# Patient Record
Sex: Male | Born: 1935
Health system: Southern US, Community
[De-identification: ages and names within clinical notes are randomized; demographics above are authoritative.]

## PROBLEM LIST (undated history)

## (undated) DIAGNOSIS — I10 Essential (primary) hypertension: Secondary | ICD-10-CM

## (undated) DIAGNOSIS — E785 Hyperlipidemia, unspecified: Secondary | ICD-10-CM

## (undated) DIAGNOSIS — R0600 Dyspnea, unspecified: Secondary | ICD-10-CM

## (undated) DIAGNOSIS — C801 Malignant (primary) neoplasm, unspecified: Secondary | ICD-10-CM

## (undated) DIAGNOSIS — M810 Age-related osteoporosis without current pathological fracture: Secondary | ICD-10-CM

## (undated) DIAGNOSIS — I639 Cerebral infarction, unspecified: Secondary | ICD-10-CM

## (undated) DIAGNOSIS — L309 Dermatitis, unspecified: Secondary | ICD-10-CM

## (undated) DIAGNOSIS — I509 Heart failure, unspecified: Secondary | ICD-10-CM

## (undated) DIAGNOSIS — T7840XA Allergy, unspecified, initial encounter: Secondary | ICD-10-CM

## (undated) DIAGNOSIS — J45909 Unspecified asthma, uncomplicated: Secondary | ICD-10-CM

## (undated) HISTORY — DX: Hyperlipidemia, unspecified: E78.5

## (undated) HISTORY — DX: Heart failure, unspecified: I50.9

## (undated) HISTORY — DX: Malignant (primary) neoplasm, unspecified: C80.1

## (undated) HISTORY — PX: MOHS SURGERY: SUR867

## (undated) HISTORY — DX: Age-related osteoporosis without current pathological fracture: M81.0

## (undated) HISTORY — DX: Cerebral infarction, unspecified: I63.9

## (undated) HISTORY — DX: Allergy, unspecified, initial encounter: T78.40XA

## (undated) HISTORY — DX: Essential (primary) hypertension: I10

## (undated) HISTORY — DX: Dermatitis, unspecified: L30.9

---
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
## 2011-10-14 ENCOUNTER — Ambulatory Visit (INDEPENDENT_AMBULATORY_CARE_PROVIDER_SITE_OTHER): Payer: Medicare Other | Admitting: Family Medicine

## 2011-10-14 DIAGNOSIS — E785 Hyperlipidemia, unspecified: Secondary | ICD-10-CM

## 2011-10-14 DIAGNOSIS — Z23 Encounter for immunization: Secondary | ICD-10-CM

## 2011-10-14 DIAGNOSIS — L241 Irritant contact dermatitis due to oils and greases: Secondary | ICD-10-CM

## 2011-10-14 DIAGNOSIS — I10 Essential (primary) hypertension: Secondary | ICD-10-CM

## 2011-12-25 ENCOUNTER — Other Ambulatory Visit: Payer: Self-pay | Admitting: Family Medicine

## 2012-03-11 ENCOUNTER — Other Ambulatory Visit: Payer: Self-pay | Admitting: Family Medicine

## 2012-04-13 ENCOUNTER — Ambulatory Visit: Payer: Medicare Other | Admitting: Family Medicine

## 2012-04-22 ENCOUNTER — Encounter: Payer: Self-pay | Admitting: Family Medicine

## 2012-04-22 ENCOUNTER — Ambulatory Visit: Payer: Medicare Other | Admitting: Family Medicine

## 2012-04-22 VITALS — BP 118/71 | HR 104 | Temp 97.1°F | Resp 18 | Ht 68.0 in | Wt 155.0 lb

## 2012-04-22 DIAGNOSIS — L241 Irritant contact dermatitis due to oils and greases: Secondary | ICD-10-CM

## 2012-04-22 DIAGNOSIS — L309 Dermatitis, unspecified: Secondary | ICD-10-CM

## 2012-04-22 DIAGNOSIS — I10 Essential (primary) hypertension: Secondary | ICD-10-CM | POA: Diagnosis not present

## 2012-04-22 DIAGNOSIS — E785 Hyperlipidemia, unspecified: Secondary | ICD-10-CM

## 2012-04-22 MED ORDER — METHYLPREDNISOLONE ACETATE 40 MG/ML IJ SUSP
80.0000 mg | Freq: Once | INTRAMUSCULAR | Status: AC
  Administered 2012-04-22: 80 mg via INTRAMUSCULAR

## 2012-04-22 MED ORDER — LISINOPRIL 20 MG PO TABS: 20.0000 mg | ORAL_TABLET | Freq: Every day | ORAL | Status: DC

## 2012-04-22 MED ORDER — PRAVASTATIN SODIUM 40 MG PO TABS: 40.0000 mg | ORAL_TABLET | Freq: Every day | ORAL | Status: DC

## 2012-04-22 MED ORDER — PREDNISONE 20 MG PO TABS: ORAL_TABLET | ORAL | Status: DC

## 2012-04-22 MED ORDER — TRIAMCINOLONE ACETONIDE 0.1 % EX CREA: TOPICAL_CREAM | Freq: Two times a day (BID) | CUTANEOUS | Status: DC

## 2012-04-22 NOTE — Patient Instructions (Signed)
Try OTC Flax Seed Oil capsule  1200 mg 1 capsule daily to reduce inflammation and flare-up of Eczema.   Eczema Atopic dermatitis, or eczema, is an inherited type of sensitive skin. Often people with eczema have a family history of allergies, asthma, or hay fever. It causes a red itchy rash and dry scaly skin. The itchiness may occur before the skin rash and may be very intense. It is not contagious. Eczema is generally worse during the cooler winter months and often improves with the warmth of summer. Eczema usually starts showing signs in infancy. Some children outgrow eczema, but it may last through adulthood. Flare-ups may be caused by:  Eating something or contact with something you are sensitive or allergic to.   Stress.  DIAGNOSIS  The diagnosis of eczema is usually based upon symptoms and medical history. TREATMENT  Eczema cannot be cured, but symptoms usually can be controlled with treatment or avoidance of allergens (things to which you are sensitive or allergic to).  Controlling the itching and scratching.   Use over-the-counter antihistamines as directed for itching. It is especially useful at night when the itching tends to be worse.   Use over-the-counter steroid creams as directed for itching.   Scratching makes the rash and itching worse and may cause impetigo (a skin infection) if fingernails are contaminated (dirty).   Keeping the skin well moisturized with creams every day. This will seal in moisture and help prevent dryness. Lotions containing alcohol and water can dry the skin and are not recommended.   Limiting exposure to allergens.   Recognizing situations that cause stress.   Developing a plan to manage stress.  HOME CARE INSTRUCTIONS   Take prescription and over-the-counter medicines as directed by your caregiver.   Do not use anything on the skin without checking with your caregiver.   Keep baths or showers short (5 minutes) in warm (not hot) water. Use  mild cleansers for bathing. You may add non-perfumed bath oil to the bath water. It is best to avoid soap and bubble bath.   Immediately after a bath or shower, when the skin is still damp, apply a moisturizing ointment to the entire body. This ointment should be a petroleum ointment. This will seal in moisture and help prevent dryness. The thicker the ointment the better. These should be unscented.   Keep fingernails cut short and wash hands often. If your child has eczema, it may be necessary to put soft gloves or mittens on your child at night.   Dress in clothes made of cotton or cotton blends. Dress lightly, as heat increases itching.   Avoid foods that may cause flare-ups. Common foods include cow's milk, peanut butter, eggs and wheat.   Keep a child with eczema away from anyone with fever blisters. The virus that causes fever blisters (herpes simplex) can cause a serious skin infection in children with eczema.  SEEK MEDICAL CARE IF:   Itching interferes with sleep.   The rash gets worse or is not better within one week following treatment.   The rash looks infected (pus or soft yellow scabs).   You or your child has an oral temperature above 102 F (38.9 C).   Your baby is older than 3 months with a rectal temperature of 100.5 F (38.1 C) or higher for more than 1 day.   The rash flares up after contact with someone who has fever blisters.  SEEK IMMEDIATE MEDICAL CARE IF:   Your baby is older  than 3 months with a rectal temperature of 102 F (38.9 C) or higher.   Your baby is older than 3 months or younger with a rectal temperature of 100.4 F (38 C) or higher.  Document Released: 10/11/2000 Document Revised: 10/03/2011 Document Reviewed: 08/16/2009 [facility] Patient Information 2012 [place], [place].

## 2012-04-23 ENCOUNTER — Encounter: Payer: Self-pay | Admitting: Family Medicine

## 2012-04-23 DIAGNOSIS — L309 Dermatitis, unspecified: Secondary | ICD-10-CM | POA: Insufficient documentation

## 2012-04-23 DIAGNOSIS — E785 Hyperlipidemia, unspecified: Secondary | ICD-10-CM | POA: Insufficient documentation

## 2012-04-23 DIAGNOSIS — I10 Essential (primary) hypertension: Secondary | ICD-10-CM | POA: Insufficient documentation

## 2012-04-23 NOTE — Progress Notes (Signed)
  Subjective:    Patient ID: [name], male    DOB: 02-05-1932, 78 y.o.   MRN: [id number]  HPI  This 78 y.o Cauc male is in good health, without new complaints today and wants to met this new PCP  and get medication RFs. He has HTN, Lipid disorder and chronic eczema which flares in the summer  and with changes of weather. He uses topical medication but occasionally will uses oral Prednisone  (requests RF to be on file for prn use). He is compliant with prescription meds and denies any adverse effects.    Review of Systems  Constitutional: Negative for fever, activity change, appetite change and fatigue.  HENT:       Mild allergy symptoms with mild wheezing  Respiratory: Negative for cough, chest tightness and shortness of breath.   Cardiovascular: Negative for chest pain, palpitations and leg swelling.  Musculoskeletal: Negative.   Skin: Positive for rash.  Neurological: Negative.        Objective:   Physical Exam  Nursing note and vitals reviewed. Constitutional: He is oriented to person, place, and time. He appears well-developed and well-nourished. No distress.  HENT:  Head: Normocephalic and atraumatic.  Eyes: EOM are normal. No scleral icterus.  Cardiovascular: Normal rate.   Pulmonary/Chest: Effort normal. No respiratory distress.  Musculoskeletal: Normal range of motion. He exhibits no edema.  Neurological: He is alert and oriented to person, place, and time. No cranial nerve deficit.  Skin: Skin is warm and dry. Rash noted.       Eczematoid lesions on extremities  Psychiatric: He has a normal mood and affect. His behavior is normal. Thought content normal.          Assessment & Plan:   1. Eczema - continue topical tx methylPREDNISolone acetate (DEPO-MEDROL) injection 80 mg (per pt request)  RX: Prednisone oral for prn use  2. HTN (hypertension) -stable and controlled Continue current medication

## 2012-08-27 ENCOUNTER — Encounter: Payer: Self-pay | Admitting: Family Medicine

## 2012-08-27 ENCOUNTER — Ambulatory Visit (INDEPENDENT_AMBULATORY_CARE_PROVIDER_SITE_OTHER): Payer: Medicare Other | Admitting: Family Medicine

## 2012-08-27 VITALS — BP 122/74 | HR 90 | Temp 98.3°F | Resp 20 | Ht 69.0 in | Wt 160.0 lb

## 2012-08-27 DIAGNOSIS — L259 Unspecified contact dermatitis, unspecified cause: Secondary | ICD-10-CM

## 2012-08-27 DIAGNOSIS — L239 Allergic contact dermatitis, unspecified cause: Secondary | ICD-10-CM

## 2012-08-27 MED ORDER — METHYLPREDNISOLONE ACETATE 80 MG/ML IJ SUSP
80.0000 mg | Freq: Once | INTRAMUSCULAR | Status: AC
  Administered 2012-08-27: 80 mg via INTRAMUSCULAR

## 2012-08-27 MED ORDER — CETIRIZINE HCL 5 MG PO TABS: 5.0000 mg | ORAL_TABLET | Freq: Every day | ORAL | Status: DC

## 2012-08-27 MED ORDER — PREDNISONE 20 MG PO TABS: ORAL_TABLET | ORAL | Status: DC

## 2012-08-27 MED ORDER — HYDROXYZINE HCL 10 MG PO TABS: 10.0000 mg | ORAL_TABLET | Freq: Three times a day (TID) | ORAL | Status: DC | PRN

## 2012-08-27 NOTE — Progress Notes (Signed)
S:  This 78 y.o. Cauc male has eczema which flares up 2-3 times a year and responds well to Prednisone dose pack. He reports annual trouble in late October. He also tries to avoid cheese and has not had any offending foods lately. He does ride a bicycle and thinks this flare-up is due to tree mold or other environmental agent. This flare started 3 days ago and pt "wanted to try to tough it out" until December appt but intense pruritis was interfering with sleep. Application of topical  Steroid not very helpful.   ROS: Negative for fever/ chills, HA, cough or wheezing, n/v/d, myalgias, arthralgias, dizziness or weakness.  O:  Filed Vitals:   08/27/12 1539  BP: 122/74  Pulse: 90  Temp: 98.3 F (36.8 C)  Resp: 20   GEN: In NAD; WN,WD. HEENT: [place]/AT; EOMI with red-rimmed eyelids and injected conj; nonicteric sclerae. Post pharynx erythematous. COR: RRR. LUNGS: CTA w/o wheezes or rales. SKIN: Diffuse erythema with excoriations on ext. NEURO: A&O x 3; CNs intact. Nonfocal.  A/P: 1. Allergic eczema  methylPREDNISolone acetate (DEPO-MEDROL) injection 80 mg x 1 now RX: Cetirizine 5 mg 1 tab daily RX: Hydroxyzine 10 mg  1 tab 2-3 times daily prn itch (advised about possible sedation) RX: Prednisone dose pack   #24  w/ 1 RF Advised about limiting exposure to allergens

## 2012-08-27 NOTE — Patient Instructions (Signed)
Eczema Atopic dermatitis, or eczema, is an inherited type of sensitive skin. Often people with eczema have a family history of allergies, asthma, or hay fever. It causes a red itchy rash and dry scaly skin. The itchiness may occur before the skin rash and may be very intense. It is not contagious. Eczema is generally worse during the cooler winter months and often improves with the warmth of summer. Eczema usually starts showing signs in infancy. Some children outgrow eczema, but it may last through adulthood. Flare-ups may be caused by:  Eating something or contact with something you are sensitive or allergic to.  Stress. DIAGNOSIS  The diagnosis of eczema is usually based upon symptoms and medical history. TREATMENT  Eczema cannot be cured, but symptoms usually can be controlled with treatment or avoidance of allergens (things to which you are sensitive or allergic to).  Controlling the itching and scratching.  Use over-the-counter antihistamines as directed for itching. It is especially useful at night when the itching tends to be worse.  Use over-the-counter steroid creams as directed for itching.  Scratching makes the rash and itching worse and may cause impetigo (a skin infection) if fingernails are contaminated (dirty).  Keeping the skin well moisturized with creams every day. This will seal in moisture and help prevent dryness. Lotions containing alcohol and water can dry the skin and are not recommended.  Limiting exposure to allergens.  Recognizing situations that cause stress.  Developing a plan to manage stress. HOME CARE INSTRUCTIONS   Take prescription and over-the-counter medicines as directed by your caregiver.  Do not use anything on the skin without checking with your caregiver.  Keep baths or showers short (5 minutes) in warm (not hot) water. Use mild cleansers for bathing. You may add non-perfumed bath oil to the bath water. It is best to avoid soap and bubble  bath.  Immediately after a bath or shower, when the skin is still damp, apply a moisturizing ointment to the entire body. This ointment should be a petroleum ointment. This will seal in moisture and help prevent dryness. The thicker the ointment the better. These should be unscented.  Keep fingernails cut short and wash hands often. If your child has eczema, it may be necessary to put soft gloves or mittens on your child at night.  Dress in clothes made of cotton or cotton blends. Dress lightly, as heat increases itching.  Avoid foods that may cause flare-ups. Common foods include cow's milk, peanut butter, eggs and wheat.  Keep a child with eczema away from anyone with fever blisters. The virus that causes fever blisters (herpes simplex) can cause a serious skin infection in children with eczema. SEEK MEDICAL CARE IF:   Itching interferes with sleep.  The rash gets worse or is not better within one week following treatment.  The rash looks infected (pus or soft yellow scabs).  You or your child has an oral temperature above 102 F (38.9 C).  Your baby is older than 3 months with a rectal temperature of 100.5 F (38.1 C) or higher for more than 1 day.  The rash flares up after contact with someone who has fever blisters. SEEK IMMEDIATE MEDICAL CARE IF:   Your baby is older than 3 months with a rectal temperature of 102 F (38.9 C) or higher.  Your baby is older than 3 months or younger with a rectal temperature of 100.4 F (38 C) or higher. Document Released: 10/11/2000 Document Revised: 01/06/2012 Document Reviewed: 08/16/2009 [employer]   Patient Information 2013 [place], [place].    Anti Itch medication:  Cetirizine 5 mg  Take 1 tablet every day. Short-term itch medication:  Hydroxyzine 10 mg   Take 1 tablet 3 times a day to help reduce your urge to scratch. This medication may cause a little drowsiness. You might want to take this just at bedtime. Prednisone: Take this  medication as you usually do for this eczema flare-up.

## 2012-10-01 ENCOUNTER — Other Ambulatory Visit: Payer: Self-pay | Admitting: Family Medicine

## 2012-10-14 ENCOUNTER — Ambulatory Visit (INDEPENDENT_AMBULATORY_CARE_PROVIDER_SITE_OTHER): Payer: Medicare Other | Admitting: Family Medicine

## 2012-10-14 ENCOUNTER — Encounter: Payer: Self-pay | Admitting: Family Medicine

## 2012-10-14 VITALS — BP 100/64 | HR 114 | Temp 96.8°F | Resp 20 | Ht 68.0 in | Wt 157.0 lb

## 2012-10-14 DIAGNOSIS — I10 Essential (primary) hypertension: Secondary | ICD-10-CM

## 2012-10-14 DIAGNOSIS — L259 Unspecified contact dermatitis, unspecified cause: Secondary | ICD-10-CM

## 2012-10-14 DIAGNOSIS — L309 Dermatitis, unspecified: Secondary | ICD-10-CM

## 2012-10-14 DIAGNOSIS — Z23 Encounter for immunization: Secondary | ICD-10-CM | POA: Diagnosis not present

## 2012-10-14 MED ORDER — METHYLPREDNISOLONE ACETATE 80 MG/ML IJ SUSP
120.0000 mg | Freq: Once | INTRAMUSCULAR | Status: AC
  Administered 2012-10-14: 120 mg via INTRAMUSCULAR

## 2012-10-14 MED ORDER — PREDNISONE 20 MG PO TABS: ORAL_TABLET | ORAL | Status: DC

## 2012-10-14 MED ORDER — MONTELUKAST SODIUM 10 MG PO TABS: 10.0000 mg | ORAL_TABLET | Freq: Every day | ORAL | Status: DC

## 2012-10-14 NOTE — Patient Instructions (Addendum)
I have prescribed a new medication to see if we can calm down the eczema. Singulair (gegenric) 10 mg should be taken at bedtime. I will see you back in 3 months to see if this medication has helped. Try to avoid those foods that cause your eczema to flare up. If you have another outbreak, please let contact the office.

## 2012-10-14 NOTE — Progress Notes (Signed)
S: This 78 y.o. Cauc male returns today because of severe flare of chronic eczema. He is not sure what caused outbreak this time; he tries to avoid cheese but remembers he ate some lasagne a few nights ago. Otherwise he feels really good. He remains afebrile with good appetite. The severe itching is disturbing his sleep. Cetirizine prescribed at last visit is ineffective. Prednisone works "like a dream"; he has a refill but wanted to be seen before he took this medication again. He used to see Dr. Jorja Loa years ago and has been evaluated at [facility] (biopsy confirmed eczema). Pt requests steroid injection today.  ROS: As per HPI. Pt denies unexplained weight loss or fatigue, SOB or cough, DOE, CP or tightness.  O:  Filed Vitals:   10/14/12 0755  BP: 100/64  Pulse: 114  Temp: 96.8 F (36 C)  Resp: 20   GEN: In NAD; WN,WD. HENT: [place]/AT; EOMI w/ chemosis and injected conj. Oropharynx clear, slightly pale w/o exudate or lesions. COR: RRR. No edema. LUNGS: Mild end-exp wheezes. No retractions, rhonchi or rales. SKIN: W&D; diffuse erythematous scaly lesions on face, trunk and extremities. NEURO: A&O x 3; CNs intact. Nonfocal.  A/P:  1. Chronic eczema  methylPREDNISolone acetate (DEPO-MEDROL) injection 120 mg RX: Singulair 10 mg  (generic) 1 tablet at bedtime Cetrizine discontinued.  2. HTN, goal below 140/80 - stable Continue current medication  3. Need for prophylactic vaccination and inoculation against influenza  Flu vaccine greater than or equal to 3yo preservative free IM

## 2013-01-13 ENCOUNTER — Ambulatory Visit: Payer: Medicare Other | Admitting: Family Medicine

## 2013-01-20 ENCOUNTER — Ambulatory Visit (INDEPENDENT_AMBULATORY_CARE_PROVIDER_SITE_OTHER): Payer: Medicare Other | Admitting: Family Medicine

## 2013-01-20 ENCOUNTER — Encounter: Payer: Self-pay | Admitting: Family Medicine

## 2013-01-20 VITALS — BP 156/90 | HR 89 | Temp 97.5°F | Resp 16 | Ht 68.0 in | Wt 157.8 lb

## 2013-01-20 DIAGNOSIS — I10 Essential (primary) hypertension: Secondary | ICD-10-CM | POA: Diagnosis not present

## 2013-01-20 DIAGNOSIS — L259 Unspecified contact dermatitis, unspecified cause: Secondary | ICD-10-CM | POA: Diagnosis not present

## 2013-01-20 DIAGNOSIS — L309 Dermatitis, unspecified: Secondary | ICD-10-CM

## 2013-01-20 DIAGNOSIS — E785 Hyperlipidemia, unspecified: Secondary | ICD-10-CM

## 2013-01-20 LAB — COMPREHENSIVE METABOLIC PANEL
AST: 24 U/L (ref 0–37)
Albumin: 4.4 g/dL (ref 3.5–5.2)
BUN: 18 mg/dL (ref 6–23)
CO2: 26 mEq/L (ref 19–32)
Calcium: 9.9 mg/dL (ref 8.4–10.5)
Chloride: 103 mEq/L (ref 96–112)
Creat: 1.17 mg/dL (ref 0.50–1.35)
Glucose, Bld: 84 mg/dL (ref 70–99)
Potassium: 4.2 mEq/L (ref 3.5–5.3)

## 2013-01-20 LAB — LIPID PANEL
Cholesterol: 177 mg/dL (ref 0–200)
HDL: 60 mg/dL (ref >39)
Triglycerides: 79 mg/dL (ref <150)

## 2013-01-20 MED ORDER — METHYLPREDNISOLONE ACETATE 40 MG/ML IJ SUSP
80.0000 mg | Freq: Once | INTRAMUSCULAR | Status: AC
  Administered 2013-01-20: 80 mg via INTRAMUSCULAR

## 2013-01-20 MED ORDER — MONTELUKAST SODIUM 10 MG PO TABS: ORAL_TABLET | ORAL | Status: DC

## 2013-01-20 MED ORDER — PREDNISONE 20 MG PO TABS: ORAL_TABLET | ORAL | Status: DC

## 2013-01-20 NOTE — Patient Instructions (Signed)
Montelukast (SIngulair) is prescribed to help reduce eczema symptoms; Try taking 1/2 tablet at bedtime with glass of water. This dose reduction should help reduce symptoms of itching without causing side effects.

## 2013-01-20 NOTE — Progress Notes (Signed)
S: This 79 y.o. Cauc man enjoys good health due to daily cycling and good nutrition. He has HTN (controlled on current medication) and Dyslipidemia for which he takes prescription medications (w/o side effects). He has chronic eczematous dermatitis and takes Prednisone as needed with excellent results. He has intense itching and redness but is able to sleep most  Nights. He tried Montelukast but states it caused dizziness; he stopped the medication after 3-4 doses. He attributes this condition to stress (onset during [employer] school); he had little problems with this condition while living in [place]. He is outdoors almost everyday and this is probably an aggravating factor.  Pt states that, given his age, he is not going to be requesting a lot of different tests or other interventions. He feels like he is in good health and feels good but expects his life expectancy is less than 10 years. His parents lived to about age 50.  ROS: As per HPI. Negative for fatigue, fever/ chills, unexplained weight change; diaphoresis, CP or tightness, palpitations, edema, SOB, DOE or cough, wheezes, GI problems, HA, lightheadedness, weakness, numbness or syncope.  O:  Filed Vitals:   01/20/13 1053  BP: 156/90  Pulse: 89  Temp: 97.5 F (36.4 C)  Resp: 16   GEN: In NAD; WN,WD. Scratches almost constantly. HENT: [place]/AT; EOMI injected and tearing noted w/ mild lid margin redness and edema. Oroph clear and moist. SKIN: Diffuse erythema. No other rashes noted. COR: RRR. LUNGS: Normal resp rate and effort. MS: MAEs; no c/c/e. Good muscle tone and no deformities. NEURO: A&O x 3; CNs intact. Nonfocal.  A/P: HTN (hypertension) - stable on current medications.Plan: Comprehensive metabolic panel, CBC with Differential  Eczematous dermatitis - refill oral Prednisone.  Advised pt take Montelukast 10 mg 1/2 tablet hs.  Plan: methylPREDNISolone acetate (DEPO-MEDROL) injection 80 mg x 1 intramuscular.  Dyslipidemia - Plan:  Lipid panel  Meds ordered this encounter  Medications  . methylPREDNISolone acetate (DEPO-MEDROL) injection 80 mg    Sig:   . predniSONE (DELTASONE) 20 MG tablet    Sig: Take and taper tablets as directed.    Dispense:  24 tablet    Refill:  1  . montelukast (SINGULAIR) 10 MG tablet    Sig: Take 1/2 tablet by mouth at bedtime.    Dispense:  30 tablet    Refill:  2   RTC in 4 months for [facility] Annual Subsequent CPE.

## 2013-01-23 NOTE — Progress Notes (Signed)
Quick Note:  Please notify pt that results are normal.   Provide pt with copy of labs. ______ 

## 2013-01-24 ENCOUNTER — Encounter: Payer: Self-pay | Admitting: Radiology

## 2013-05-15 ENCOUNTER — Other Ambulatory Visit: Payer: Self-pay | Admitting: Family Medicine

## 2013-05-27 ENCOUNTER — Ambulatory Visit (INDEPENDENT_AMBULATORY_CARE_PROVIDER_SITE_OTHER): Payer: Medicare Other | Admitting: Family Medicine

## 2013-05-27 ENCOUNTER — Encounter: Payer: Self-pay | Admitting: Family Medicine

## 2013-05-27 VITALS — BP 150/82 | HR 118 | Temp 97.5°F | Resp 18 | Ht 68.0 in | Wt 151.8 lb

## 2013-05-27 DIAGNOSIS — Z139 Encounter for screening, unspecified: Secondary | ICD-10-CM

## 2013-05-27 DIAGNOSIS — H6120 Impacted cerumen, unspecified ear: Secondary | ICD-10-CM

## 2013-05-27 DIAGNOSIS — L259 Unspecified contact dermatitis, unspecified cause: Secondary | ICD-10-CM | POA: Diagnosis not present

## 2013-05-27 DIAGNOSIS — N400 Enlarged prostate without lower urinary tract symptoms: Secondary | ICD-10-CM | POA: Diagnosis not present

## 2013-05-27 DIAGNOSIS — I10 Essential (primary) hypertension: Secondary | ICD-10-CM | POA: Diagnosis not present

## 2013-05-27 DIAGNOSIS — H6121 Impacted cerumen, right ear: Secondary | ICD-10-CM

## 2013-05-27 DIAGNOSIS — I498 Other specified cardiac arrhythmias: Secondary | ICD-10-CM | POA: Diagnosis not present

## 2013-05-27 DIAGNOSIS — Z0000 Encounter for general adult medical examination without abnormal findings: Secondary | ICD-10-CM

## 2013-05-27 DIAGNOSIS — R000 Tachycardia, unspecified: Secondary | ICD-10-CM

## 2013-05-27 LAB — POCT URINALYSIS DIPSTICK
Ketones, UA: NEGATIVE
Protein, UA: 30
Spec Grav, UA: 1.025
pH, UA: 6.5

## 2013-05-27 MED ORDER — PRAVASTATIN SODIUM 40 MG PO TABS: ORAL_TABLET | ORAL | Status: DC

## 2013-05-27 MED ORDER — MONTELUKAST SODIUM 10 MG PO TABS: ORAL_TABLET | ORAL | Status: DC

## 2013-05-27 MED ORDER — LISINOPRIL 20 MG PO TABS: 20.0000 mg | ORAL_TABLET | Freq: Every day | ORAL | Status: DC

## 2013-05-27 MED ORDER — HYDROXYZINE HCL 10 MG PO TABS: 10.0000 mg | ORAL_TABLET | Freq: Three times a day (TID) | ORAL | Status: DC | PRN

## 2013-05-27 MED ORDER — TRIAMCINOLONE ACETONIDE 0.1 % EX CREA: TOPICAL_CREAM | CUTANEOUS | Status: DC

## 2013-05-27 NOTE — Progress Notes (Signed)
Subjective:    Patient ID: [name], male    DOB: 04-16-1932, 79 y.o.   MRN: [id number]  HPI  This 79 y.o. Cauc male has HTN, dyslipidemia and chronic severe eczema. Pt is compliant   with medications w/o mention of adverse effects. Pt stays active by riding his bicycle as primary  means of transportation and lifting weights. Pt is single; he does not smoke or use alcohol.   HCM: CRS- pt declines.            IMM- Current.                PMHx, Soc Hx and Fam Hx reviewed.  Medications reconciled.   Review of Systems  Constitutional: Negative.   HENT: Negative.   Eyes: Negative.        Pt wears "readers" but has not had professional vision evaluation recently.  Respiratory: Negative.   Cardiovascular: Negative.   Gastrointestinal: Negative.   Endocrine: Negative.   Genitourinary: Negative.   Musculoskeletal: Negative.   Skin: Positive for rash.  Allergic/Immunologic: Negative.   Neurological: Negative.   Hematological: Negative.   Psychiatric/Behavioral: Negative.        HANDS Questionnaire for Depression Screening: Score= 0       Objective:   Physical Exam  Nursing note and vitals reviewed. Constitutional: He is oriented to person, place, and time. Vital signs are normal. He appears well-developed and well-nourished. No distress.  HENT:  Head: Normocephalic and atraumatic.  Right Ear: Hearing and external ear normal.  Left Ear: Hearing, tympanic membrane, external ear and ear canal normal.  Nose: Nose normal. No nasal deformity or septal deviation.  Mouth/Throat: Uvula is midline, oropharynx is clear and moist and mucous membranes are normal. No oral lesions. Normal dentition.  R ear canal blocked by cerumen.  Eyes: EOM and lids are normal. Pupils are equal, round, and reactive to light. Right eye exhibits chemosis. Left eye exhibits chemosis. Right conjunctiva is injected. Left conjunctiva is injected. No scleral icterus.  Fundoscopic exam:      The right eye shows no  papilledema. The right eye shows red reflex.       The left eye shows no papilledema. The left eye shows red reflex.  Fundoscopic assessment difficult.  Neck: Normal range of motion. Neck supple. No JVD present. No thyromegaly present.  Cardiovascular: Regular rhythm, S1 normal, S2 normal, normal heart sounds and normal pulses.  Tachycardia present.  PMI is not displaced.  Exam reveals no gallop.   No murmur heard. Pulmonary/Chest: Effort normal and breath sounds normal. No respiratory distress. He has no wheezes. He has no rales.  Abdominal: Normal appearance. He exhibits no distension, no abdominal bruit, no pulsatile midline mass and no mass. There is no hepatosplenomegaly. There is no tenderness. There is no rigidity, no guarding and no CVA tenderness. No hernia. Hernia confirmed negative in the right inguinal area and confirmed negative in the left inguinal area.  Genitourinary: Rectal exam shows external hemorrhoid. Rectal exam shows no fissure, no mass, no tenderness and anal tone normal. Prostate is enlarged. Prostate is not tender.  Prostate enlarged and firm (R lobe > L lobe).  Musculoskeletal: Normal range of motion. He exhibits no edema and no tenderness.  Mild degenerative changed noted in digits (hands) and wrists.  Lymphadenopathy:       Head (right side): No submandibular, no posterior auricular and no occipital adenopathy present.       Head (left side): No submandibular, no posterior auricular  and no occipital adenopathy present.    He has no cervical adenopathy.    He has no axillary adenopathy.       Right: No inguinal and no supraclavicular adenopathy present.       Left: No inguinal and no supraclavicular adenopathy present.  Neurological: He is alert and oriented to person, place, and time. He has normal strength and normal reflexes. He displays no atrophy and no tremor. No cranial nerve deficit or sensory deficit. He exhibits normal muscle tone. Coordination and gait  normal.  Skin: Skin is warm and dry. Abrasion and rash noted. Rash is urticarial. He is not diaphoretic. There is erythema. No cyanosis. Nails show no clubbing.  Psychiatric: He has a normal mood and affect. His speech is normal and behavior is normal. Judgment and thought content normal. His mood appears not anxious. His affect is not inappropriate. Cognition and memory are normal. He does not exhibit a depressed mood.    Results for orders placed in visit on 05/27/13  POCT URINALYSIS DIPSTICK      Result Value Range   Color, UA yellow     Clarity, UA clear     Glucose, UA neg     Bilirubin, UA small     Ketones, UA neg     Spec Grav, UA 1.025     Blood, UA neg     pH, UA 6.5     Protein, UA 30     Urobilinogen, UA 4.0     Nitrite, UA neg     Leukocytes, UA Trace    IFOBT (OCCULT BLOOD)      Result Value Range   IFOBT Negative      ECG: Sinus tachycardia; no ectopy or ST-TW changes noted.     Assessment & Plan:  Routine general medical examination at a health care facility   Sinus tachycardia - Plan: EKG 12-Lead  HTN (hypertension)- Continue current medication.  BPH (benign prostatic hypertrophy)- Pt is asymptomatic and declines further work-up.  Contact dermatitis and other eczema, due to unspecified cause- Continue current medications.  Cerumen impaction, right- Advised to use OTC cerumen drops and return to [facility] in 1 week for irrigation .

## 2013-05-27 NOTE — Patient Instructions (Addendum)
Keeping you healthy  Get these tests  Blood pressure- Have your blood pressure checked once a year by your healthcare provider.  Normal blood pressure is 120/80  Weight- Have your body mass index (BMI) calculated to screen for obesity.  BMI is a measure of body fat based on height and weight. You can also calculate your own BMI at [email or web address].  Cholesterol- Have your cholesterol checked every year.  Diabetes- Have your blood sugar checked regularly if you have high blood pressure, high cholesterol, have a family history of diabetes or if you are overweight.  Screening for Colon Cancer- Colonoscopy starting at age 66.  Screening may begin sooner depending on your family history and other health conditions. Follow up colonoscopy as directed by your Gastroenterologist.  Screening for Prostate Cancer- Both blood work (PSA) and a rectal exam help screen for Prostate Cancer.  Screening begins at age 22 with African-American men and at age 60 with Caucasian men.  Screening may begin sooner depending on your family history.  Take these medicines  Aspirin- One aspirin daily can help prevent Heart disease and Stroke.  Flu shot- Every fall.  Tetanus- Every 10 years. Tdap was given in 2008.  Zostavax- Once after the age of 57 to prevent Shingles. This vaccine was administered in 2013.  Pneumonia shot- Once after the age of 29; if you are younger than 93, ask your healthcare provider if you need a Pneumonia shot. You received this vaccine in 2008.  Take these steps  Don't smoke- If you do smoke, talk to your doctor about quitting.  For tips on how to quit, go to www.smokefree.gov or call 1-800-QUIT-NOW.  Be physically active- Exercise 5 days a week for at least 30 minutes.  If you are not already physically active start slow and gradually work up to 30 minutes of moderate physical activity.  Examples of moderate activity include walking briskly, mowing the yard, dancing, swimming,  bicycling, etc.  Eat a healthy diet- Eat a variety of healthy food such as fruits, vegetables, low fat milk, low fat cheese, yogurt, lean meant, poultry, fish, beans, tofu, etc. For more information go to www.thenutritionsource.org  Drink alcohol in moderation- Limit alcohol intake to less than two drinks a day. Never drink and drive.  Dentist- Brush and floss twice daily; visit your dentist twice a year.  Depression- Your emotional health is as important as your physical health. If you're feeling down, or losing interest in things you would normally enjoy please talk to your healthcare provider.  Eye exam- Visit your eye doctor every year.  Safe sex- If you may be exposed to a sexually transmitted infection, use a condom.  Seat belts- Seat belts can save your life; always wear one.  Smoke/Carbon Monoxide detectors- These detectors need to be installed on the appropriate level of your home.  Replace batteries at least once a year.  Skin cancer- When out in the sun, cover up and use sunscreen 15 SPF or higher.  Violence- If anyone is threatening you, please tell your healthcare provider.  Living Will/ Health care power of attorney- Speak with your healthcare provider and family.

## 2013-06-03 ENCOUNTER — Ambulatory Visit: Payer: Medicare Other | Admitting: Family Medicine

## 2013-06-09 ENCOUNTER — Encounter: Payer: Self-pay | Admitting: Family Medicine

## 2013-06-09 ENCOUNTER — Ambulatory Visit (INDEPENDENT_AMBULATORY_CARE_PROVIDER_SITE_OTHER): Payer: Medicare Other | Admitting: Family Medicine

## 2013-06-09 VITALS — BP 118/74 | HR 98 | Temp 97.9°F | Resp 18 | Ht 68.25 in | Wt 155.4 lb

## 2013-06-09 DIAGNOSIS — H6123 Impacted cerumen, bilateral: Secondary | ICD-10-CM

## 2013-06-09 DIAGNOSIS — H6120 Impacted cerumen, unspecified ear: Secondary | ICD-10-CM

## 2013-06-09 NOTE — Progress Notes (Signed)
S:  This 79 y.o. Cauc male returns today for ear irrigation; he has bilateral cerumen impactions and used ear wax removal drops at home.  O: Filed Vitals:   06/09/13 1016  BP: 118/74  Pulse: 98  Temp: 97.9 F (36.6 C)  Resp: 18   GEN: In NAD; WN,WD/ HENT: /AT. EOMI. Bilateral canals are occluded w/ cerumen. SKIN: W&D; minimal erythema and scaliness today. NEURO: A&O x 3; CNs intact. Nonfocal.  After irrigation by M. Petty, CNA- both canals are clear w/ minimal cerumen.  A/P: Cerumen impaction, bilateral- resolved.

## 2013-11-26 ENCOUNTER — Encounter: Payer: Self-pay | Admitting: Family Medicine

## 2013-11-26 ENCOUNTER — Ambulatory Visit (INDEPENDENT_AMBULATORY_CARE_PROVIDER_SITE_OTHER): Payer: Medicare Other | Admitting: Family Medicine

## 2013-11-26 VITALS — BP 154/96 | HR 100 | Temp 97.5°F | Resp 18 | Ht 68.25 in | Wt 157.0 lb

## 2013-11-26 DIAGNOSIS — I10 Essential (primary) hypertension: Secondary | ICD-10-CM | POA: Diagnosis not present

## 2013-11-26 DIAGNOSIS — L259 Unspecified contact dermatitis, unspecified cause: Secondary | ICD-10-CM | POA: Diagnosis not present

## 2013-11-26 DIAGNOSIS — L500 Allergic urticaria: Secondary | ICD-10-CM

## 2013-11-26 DIAGNOSIS — L309 Dermatitis, unspecified: Secondary | ICD-10-CM

## 2013-11-26 MED ORDER — PREDNISONE 20 MG PO TABS: ORAL_TABLET | ORAL | Status: DC

## 2013-11-26 MED ORDER — METHYLPREDNISOLONE ACETATE 80 MG/ML IJ SUSP
120.0000 mg | Freq: Once | INTRAMUSCULAR | Status: AC
  Administered 2013-11-26: 120 mg via INTRAMUSCULAR

## 2013-11-26 NOTE — Patient Instructions (Signed)
You have a prescription for Singulair (Montelukast) 10 mg 1/2 tablet at bedtime to help your allergies. The usual adult dose is 1 tablet = 10 mg but I want you to start at the lower dose because of your advanced age.  You will be scheduled to return for complete physical in early August.  Contact us sooner if you need anything.
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
## 2014-06-03 ENCOUNTER — Encounter: Payer: Self-pay | Admitting: Family Medicine

## 2014-06-03 ENCOUNTER — Ambulatory Visit (INDEPENDENT_AMBULATORY_CARE_PROVIDER_SITE_OTHER): Payer: Medicare Other | Admitting: Family Medicine

## 2014-06-03 VITALS — BP 110/74 | HR 76 | Temp 98.2°F | Resp 16 | Ht 68.75 in | Wt 152.8 lb

## 2014-06-03 DIAGNOSIS — C44310 Basal cell carcinoma of skin of unspecified parts of face: Secondary | ICD-10-CM | POA: Insufficient documentation

## 2014-06-03 DIAGNOSIS — E785 Hyperlipidemia, unspecified: Secondary | ICD-10-CM | POA: Diagnosis not present

## 2014-06-03 DIAGNOSIS — M810 Age-related osteoporosis without current pathological fracture: Secondary | ICD-10-CM | POA: Diagnosis not present

## 2014-06-03 DIAGNOSIS — L989 Disorder of the skin and subcutaneous tissue, unspecified: Secondary | ICD-10-CM | POA: Diagnosis not present

## 2014-06-03 DIAGNOSIS — H6120 Impacted cerumen, unspecified ear: Secondary | ICD-10-CM

## 2014-06-03 DIAGNOSIS — Z0000 Encounter for general adult medical examination without abnormal findings: Secondary | ICD-10-CM | POA: Diagnosis not present

## 2014-06-03 DIAGNOSIS — Z23 Encounter for immunization: Secondary | ICD-10-CM | POA: Diagnosis not present

## 2014-06-03 DIAGNOSIS — H6122 Impacted cerumen, left ear: Secondary | ICD-10-CM

## 2014-06-03 DIAGNOSIS — L259 Unspecified contact dermatitis, unspecified cause: Secondary | ICD-10-CM

## 2014-06-03 DIAGNOSIS — L309 Dermatitis, unspecified: Secondary | ICD-10-CM

## 2014-06-03 LAB — COMPLETE METABOLIC PANEL WITH GFR
ALBUMIN: 4.4 g/dL (ref 3.5–5.2)
ALK PHOS: 64 U/L (ref 39–117)
ALT: 13 U/L (ref 0–53)
AST: 19 U/L (ref 0–37)
BUN: 12 mg/dL (ref 6–23)
CO2: 26 meq/L (ref 19–32)
Calcium: 9.8 mg/dL (ref 8.4–10.5)
Chloride: 103 mEq/L (ref 96–112)
Creat: 1.22 mg/dL (ref 0.50–1.35)
GFR, EST AFRICAN AMERICAN: 64 mL/min
GFR, Est Non African American: 56 mL/min — ABNORMAL LOW
Glucose, Bld: 92 mg/dL (ref 70–99)
POTASSIUM: 4.3 meq/L (ref 3.5–5.3)
SODIUM: 141 meq/L (ref 135–145)
TOTAL PROTEIN: 7.2 g/dL (ref 6.0–8.3)
Total Bilirubin: 0.9 mg/dL (ref 0.2–1.2)

## 2014-06-03 LAB — POCT URINALYSIS DIPSTICK
Bilirubin, UA: NEGATIVE
Blood, UA: NEGATIVE
Glucose, UA: NEGATIVE
Ketones, UA: NEGATIVE
LEUKOCYTES UA: NEGATIVE
Nitrite, UA: NEGATIVE
PH UA: 6
PROTEIN UA: NEGATIVE
Spec Grav, UA: 1.01
UROBILINOGEN UA: 1

## 2014-06-03 LAB — CBC WITH DIFFERENTIAL/PLATELET
BASOS ABS: 0.1 10*3/uL (ref 0.0–0.1)
BASOS PCT: 1 % (ref 0–1)
EOS PCT: 12 % — AB (ref 0–5)
Eosinophils Absolute: 1 10*3/uL — ABNORMAL HIGH (ref 0.0–0.7)
HEMATOCRIT: 47.4 % (ref 39.0–52.0)
Hemoglobin: 16.7 g/dL (ref 13.0–17.0)
LYMPHS PCT: 18 % (ref 12–46)
Lymphs Abs: 1.4 10*3/uL (ref 0.7–4.0)
MCH: 32.4 pg (ref 26.0–34.0)
MCHC: 35.2 g/dL (ref 30.0–36.0)
MCV: 91.9 fL (ref 78.0–100.0)
MONO ABS: 0.7 10*3/uL (ref 0.1–1.0)
Monocytes Relative: 9 % (ref 3–12)
NEUTROS ABS: 4.8 10*3/uL (ref 1.7–7.7)
Neutrophils Relative %: 60 % (ref 43–77)
PLATELETS: 178 10*3/uL (ref 150–400)
RBC: 5.16 MIL/uL (ref 4.22–5.81)
RDW: 12.8 % (ref 11.5–15.5)
WBC: 8 10*3/uL (ref 4.0–10.5)

## 2014-06-03 LAB — LDL CHOLESTEROL, DIRECT: Direct LDL: 101 mg/dL — ABNORMAL HIGH

## 2014-06-03 LAB — TSH: TSH: 1.349 u[IU]/mL (ref 0.350–4.500)

## 2014-06-03 LAB — VITAMIN D 25 HYDROXY (VIT D DEFICIENCY, FRACTURES): VIT D 25 HYDROXY: 47 ng/mL (ref 30–89)

## 2014-06-03 MED ORDER — MONTELUKAST SODIUM 10 MG PO TABS: ORAL_TABLET | ORAL | Status: DC

## 2014-06-03 MED ORDER — TRIAMCINOLONE ACETONIDE 0.1 % EX CREA: TOPICAL_CREAM | CUTANEOUS | Status: DC

## 2014-06-03 MED ORDER — PREDNISONE 20 MG PO TABS: ORAL_TABLET | ORAL | Status: DC

## 2014-06-03 MED ORDER — PRAVASTATIN SODIUM 40 MG PO TABS: ORAL_TABLET | ORAL | Status: DC

## 2014-06-03 MED ORDER — LISINOPRIL 20 MG PO TABS: 20.0000 mg | ORAL_TABLET | Freq: Every day | ORAL | Status: DC

## 2014-06-03 NOTE — Progress Notes (Signed)
Subjective:    Patient ID: [name], male    DOB: 1932/09/01, 80 y.o.   MRN: [id number]  HPI  This 80 y.o. 59 male is here for [facility] Subsequent annual CPE. His chronic medical conditions remain stable; HTN is well controlled on current medication. Pt is compliant w/ medication for lipid disorder and has no adverse medication effects. He has seasonal allergies and severe eczema. This condition is controlled with topical steroid cream and prn oral Prednisone.  HCM: Colorectal screening- Pt declines.           IMM- Current ; needs Prevnar.           Vision- > 3 years.   Patient Active Problem List   Diagnosis Date Noted  . Osteoporosis/osteopenia increased risk 06/03/2014  . Basal cell carcinoma of face 06/03/2014  . BPH (benign prostatic hypertrophy) 05/27/2013  . Eczema 04/23/2012  . HTN (hypertension) 04/23/2012  . Dyslipidemia 04/23/2012    Prior to Admission medications   Medication Sig Start Date End Date Taking? Authorizing Provider  lisinopril (PRINIVIL,ZESTRIL) 20 MG tablet Take 1 tablet (20 mg total) by mouth daily.   Yes Barton Fanny, MD  pravastatin (PRAVACHOL) 40 MG tablet TAKE 1 TABLET ONCE DAILY.   Yes Barton Fanny, MD  triamcinolone cream (KENALOG) 0.1 % APPLY TWICE DAILY.   Yes Barton Fanny, MD  Flaxseed, Linseed, (FLAX SEED OIL PO) Take by mouth.    Historical Provider, MD  predniSONE (DELTASONE) 20 MG tablet Take and taper tablets as directed.    Barton Fanny, MD    No Known Allergies  Past Surgical History  Procedure Laterality Date  . Tonsillectomy  1940 maybe    Family History  Problem Relation Age of Onset  . Arthritis Mother   . Heart disease Mother 95  . Hypertension Father     History   Social History  . Marital Status: Married    Spouse Name: N/A    Number of Children: N/A  . Years of Education: N/A   Occupational History  . Not on file.   Social History Main Topics  . Smoking status: Never Smoker   .  Smokeless tobacco: Not on file  . Alcohol Use: No     Comment: occas  . Drug Use: No  . Sexual Activity: Not on file   Other Topics Concern  . Not on file   Social History Narrative  . No narrative on file    Review of Systems  Constitutional: Negative.   HENT: Negative.   Eyes: Negative.   Respiratory: Negative.   Cardiovascular: Negative.   Gastrointestinal: Negative.   Endocrine: Negative.   Genitourinary: Negative.   Allergic/Immunologic: Negative.   Neurological: Negative.   Hematological: Negative.   Psychiatric/Behavioral: Negative.       Objective:   Physical Exam  Nursing note and vitals reviewed. Constitutional: He is oriented to person, place, and time. Vital signs are normal. He appears well-developed and well-nourished. No distress.  HENT:  Head: Normocephalic and atraumatic.  Right Ear: Hearing, tympanic membrane, external ear and ear canal normal.  Left Ear: Hearing and external ear normal.  Nose: Nose normal. No nasal deformity or septal deviation.  Mouth/Throat: Uvula is midline, oropharynx is clear and moist and mucous membranes are normal. No oral lesions. Normal dentition. No uvula swelling.  Left EAC blocked by cerumen; cleared w/ alvage and removal of excess cerumen w/ alligator forceps. TM normal.  Eyes: EOM and lids are normal.  Pupils are equal, round, and reactive to light. Right eye exhibits chemosis. Right eye exhibits no discharge. Left eye exhibits chemosis. Left eye exhibits no discharge. Right conjunctiva is injected. Left conjunctiva is injected. No scleral icterus.  Neck: Trachea normal, normal range of motion and full passive range of motion without pain. Neck supple. No JVD present. No spinous process tenderness and no muscular tenderness present. Carotid bruit is not present. No rigidity. No mass and no thyromegaly present.  Cardiovascular: Normal rate, regular rhythm, S1 normal, S2 normal, normal heart sounds, intact distal pulses and  normal pulses.   Occasional extrasystoles are present. PMI is not displaced.  Exam reveals no gallop and no friction rub.   No murmur heard. Pulmonary/Chest: Effort normal and breath sounds normal. No accessory muscle usage. No respiratory distress. He has no decreased breath sounds. He has no rales.  Faint exp wheezes scattered throughout lower lung fields.  Abdominal: Soft. Normal appearance and bowel sounds are normal. He exhibits no distension, no abdominal bruit, no pulsatile midline mass and no mass. There is no hepatosplenomegaly. There is no tenderness. There is no guarding and no CVA tenderness. No hernia.  Genitourinary:  DRE deferred.  Musculoskeletal:       Cervical back: Normal.       Thoracic back: Normal.       Lumbar back: Normal.  Remainder of exam remarkable for mild to moderate degenerative changes in digits (hands) , wrists, knees, ankles and feet.  Lymphadenopathy:       Head (right side): No submental, no submandibular, no tonsillar, no preauricular, no posterior auricular and no occipital adenopathy present.       Head (left side): No submental, no submandibular, no tonsillar, no preauricular, no posterior auricular and no occipital adenopathy present.    He has no cervical adenopathy.       Right: No inguinal and no supraclavicular adenopathy present.       Left: No inguinal and no supraclavicular adenopathy present.  Neurological: He is alert and oriented to person, place, and time. He has normal strength and normal reflexes. He displays no atrophy and no tremor. No cranial nerve deficit or sensory deficit. He exhibits normal muscle tone. He displays a negative Romberg sign. Coordination and gait normal.  Get up and Go Test: normal (< 10 sec).  Skin: Skin is warm, dry and intact. Lesion and rash noted. No ecchymosis and no petechiae noted. He is not diaphoretic. There is erythema. No cyanosis. No pallor. Nails show no clubbing.  Very fair complexion w/ scaly areas; + sun  damage. Face- L upper cheek 5 mm umbilicated lesion; raised edges appear slightly pearly.  Psychiatric: He has a normal mood and affect. His speech is normal and behavior is normal. Judgment and thought content normal. Cognition and memory are normal.    Results for orders placed in visit on 06/03/14  POCT URINALYSIS DIPSTICK      Result Value Ref Range   Color, UA yellow     Clarity, UA clear     Glucose, UA neg     Bilirubin, UA neg     Ketones, UA neg     Spec Grav, UA 1.010     Blood, UA neg     pH, UA 6.0     Protein, UA neg     Urobilinogen, UA 1.0     Nitrite, UA neg     Leukocytes, UA Negative        Assessment & Plan:  Routine general medical examination at a health care facility - Plan: POCT urinalysis dipstick, CBC with Differential, TSH, Vit D  25 hydroxy (rtn osteoporosis monitoring), LDL cholesterol, direct, COMPLETE METABOLIC PANEL WITH GFR  Eczema - Plan: Vit D  25 hydroxy (rtn osteoporosis monitoring)  Facial skin lesion - Advised DERM evaluation but pt declined. Plan: Vit D  25 hydroxy (rtn osteoporosis monitoring)  Dyslipidemia - Plan: POCT urinalysis dipstick, CBC with Differential, TSH, Vit D  25 hydroxy (rtn osteoporosis monitoring), LDL cholesterol, direct, COMPLETE METABOLIC PANEL WITH GFR  Cerumen impaction, left- Successful lavage and removal.  Need for prophylactic vaccination against Streptococcus pneumoniae (pneumococcus) - Plan: Pneumococcal conjugate vaccine 13-valent IM  Osteoporosis, unspecified - Plan: Vit D  25 hydroxy (rtn osteoporosis monitoring)  Meds ordered this encounter  Medications  . lisinopril (PRINIVIL,ZESTRIL) 20 MG tablet    Sig: Take 1 tablet (20 mg total) by mouth daily.    Dispense:  90 tablet    Refill:  3  . DISCONTD: montelukast (SINGULAIR) 10 MG tablet    Sig: Take 1/2 tablet by mouth at bedtime.    Dispense:  30 tablet    Refill:  5  . pravastatin (PRAVACHOL) 40 MG tablet    Sig: TAKE 1 TABLET ONCE DAILY.     Dispense:  90 tablet    Refill:  3  . triamcinolone cream (KENALOG) 0.1 %    Sig: APPLY TWICE DAILY.    Dispense:  453.6 g    Refill:  5  . predniSONE (DELTASONE) 20 MG tablet    Sig: Take and taper tablets as directed.    Dispense:  24 tablet    Refill:  2

## 2014-06-03 NOTE — Patient Instructions (Signed)
Keeping you healthy  Get these tests  Blood pressure- Have your blood pressure checked once a year by your healthcare provider.  Normal blood pressure is 120/80  Weight- Have your body mass index (BMI) calculated to screen for obesity.  BMI is a measure of body fat based on height and weight. You can also calculate your own BMI at [employer].  Cholesterol- Have your cholesterol checked every year.  Diabetes- Have your blood sugar checked regularly if you have high blood pressure, high cholesterol, have a family history of diabetes or if you are overweight.  Screening for Colon Cancer- Colonoscopy starting at age 26.  Screening may begin sooner depending on your family history and other health conditions. Follow up colonoscopy as directed by your Gastroenterologist.  Screening for Prostate Cancer- Both blood work (PSA) and a rectal exam help screen for Prostate Cancer.  Screening begins at age 39 with African-American men and at age 47 with Caucasian men.  Screening may begin sooner depending on your family history.  Take these medicines  Aspirin- One aspirin daily can help prevent Heart disease and Stroke.  Flu shot- Every fall.  Tetanus- Every 10 years.  Zostavax- Once after the age of 54 to prevent Shingles.  Pneumonia shot- Once after the age of 31; if you are younger than 51, ask your healthcare provider if you need a Pneumonia shot.  Take these steps  Don't smoke- If you do smoke, talk to your doctor about quitting.  For tips on how to quit, go to www.smokefree.gov or call 1-800-QUIT-NOW.  Be physically active- Exercise 5 days a week for at least 30 minutes.  If you are not already physically active start slow and gradually work up to 30 minutes of moderate physical activity.  Examples of moderate activity include walking briskly, mowing the yard, dancing, swimming, bicycling, etc.  Eat a healthy diet- Eat a variety of healthy food such as fruits, vegetables, low  fat milk, low fat cheese, yogurt, lean meant, poultry, fish, beans, tofu, etc. For more information go to www.thenutritionsource.org  Drink alcohol in moderation- Limit alcohol intake to less than two drinks a day. Never drink and drive.  Dentist- Brush and floss twice daily; visit your dentist twice a year.  Depression- Your emotional health is as important as your physical health. If you're feeling down, or losing interest in things you would normally enjoy please talk to your healthcare provider.  Eye exam- Visit your eye doctor every year.  Safe sex- If you may be exposed to a sexually transmitted infection, use a condom.  Seat belts- Seat belts can save your life; always wear one.  Smoke/Carbon Monoxide detectors- These detectors need to be installed on the appropriate level of your home.  Replace batteries at least once a year.  Skin cancer- When out in the sun, cover up and use sunscreen 15 SPF or higher. Watch skin lesion on L upper cheek.  Violence- If anyone is threatening you, please tell your healthcare provider.  Living Will/ Health care power of attorney- Speak with your healthcare provider and family.    Vitamin D Deficiency  Not having enough vitamin D is called a deficiency. Your body needs this vitamin to keep your bones strong and healthy. Having too little of it can make your bones soft or can cause other health problems.  HOME CARE  Take all vitamins, herbs, or nutrition drinks (supplements) as told by your doctor.  Have your blood tested 2 months after taking vitamins, herbs,  or nutrition drinks.  Eat foods that have vitamin D. This includes:  Dairy products, cereals, or juices with added vitamin D. Check the label.  Fatty fish like salmon or trout.  Eggs.  Oysters.  Do not use tanning beds.  Stay at a healthy weight. Lose weight if needed.  Keep all doctor visits as told. GET HELP IF:  You have questions.  You continue to have problems.  You  feel sick to your stomach (nauseous) or throw up (vomit).  You cannot go poop (constipated).  You feel confused.  You have severe belly (abdominal) or back pain. MAKE SURE YOU:  Understand these instructions.  Will watch your condition.  Will get help right away if you are not doing well or get worse. Document Released: 10/03/2011 Document Revised: 02/08/2013 Document Reviewed: 10/03/2011 [facility] Patient Information 2015 [place], [place]. This information is not intended to replace advice given to you by your health care provider. Make sure you discuss any questions you have with your health care provider.        Mediterranean Diet  Why follow it? Research shows.   Those who follow the Mediterranean diet have a reduced risk of heart disease    The diet is associated with a reduced incidence of Parkinson's and Alzheimer's diseases   People following the diet may have longer life expectancies and lower rates of chronic diseases    The Dietary Guidelines for Americans recommends the Mediterranean diet as an eating plan to promote health and prevent disease  What Is the Mediterranean Diet?    Healthy eating plan based on typical foods and recipes of Mediterranean-style cooking   The diet is primarily a plant based diet; these foods should make up a majority of meals   Starches - Plant based foods should make up a majority of meals - They are an important sources of vitamins, minerals, energy, antioxidants, and fiber - Choose whole grains, foods high in fiber and minimally processed items  - Typical grain sources include wheat, oats, barley, corn, brown rice, bulgar, farro, millet, polenta, couscous  - Various types of beans include chickpeas, lentils, fava beans, black beans, white beans   Fruits  Veggies - Large quantities of antioxidant rich fruits & veggies; 6 or more servings  - Vegetables can be eaten raw or lightly drizzled with oil and cooked  - Vegetables common to the  traditional Mediterranean Diet include: artichokes, arugula, beets, broccoli, brussel sprouts, cabbage, carrots, celery, collard greens, cucumbers, eggplant, kale, leeks, lemons, lettuce, mushrooms, okra, onions, peas, peppers, potatoes, pumpkin, radishes, rutabaga, shallots, spinach, sweet potatoes, turnips, zucchini - Fruits common to the Mediterranean Diet include: apples, apricots, avocados, cherries, clementines, dates, figs, grapefruits, grapes, melons, nectarines, oranges, peaches, pears, pomegranates, strawberries, tangerines  Fats - Replace butter and margarine with healthy oils, such as olive oil, canola oil, and tahini  - Limit nuts to no more than a handful a day  - Nuts include walnuts, almonds, pecans, pistachios, pine nuts  - Limit or avoid candied, honey roasted or heavily salted nuts - Olives are central to the [employer] - can be eaten whole or used in a variety of dishes   Meats Protein - Limiting red meat: no more than a few times a month - When eating red meat: choose lean cuts and keep the portion to the size of deck of cards - Eggs: approx. 0 to 4 times a week  - Fish and lean poultry: at least 2 a week  -  Healthy protein sources include, chicken, [place], lean beef, lamb - Increase intake of seafood such as tuna, salmon, trout, mackerel, shrimp, scallops - Avoid or limit high fat processed meats such as sausage and bacon  Dairy - Include moderate amounts of low fat dairy products  - Focus on healthy dairy such as fat free yogurt, skim milk, low or reduced fat cheese - Limit dairy products higher in fat such as whole or 2% milk, cheese, ice cream  Alcohol - Moderate amounts of red wine is ok  - No more than 5 oz daily for women (all ages) and men older than age 56  - No more than 10 oz of wine daily for men younger than 39  Other - Limit sweets and other desserts  - Use herbs and spices instead of salt to flavor foods  - Herbs and spices common to the traditional  Mediterranean Diet include: basil, bay leaves, chives, cloves, cumin, fennel, garlic, lavender, marjoram, mint, oregano, parsley, pepper, rosemary, sage, savory, sumac, tarragon, thyme   It's not just a diet, it's a lifestyle:    The Mediterranean diet includes lifestyle factors typical of those in the region    Foods, drinks and meals are best eaten with others and savored   Daily physical activity is important for overall good health   This could be strenuous exercise like running and aerobics   This could also be more leisurely activities such as walking, housework, yard-work, or taking the stairs   Moderation is the key; a balanced and healthy diet accommodates most foods and drinks   Consider portion sizes and frequency of consumption of certain foods   Meal Ideas & Options:    Breakfast:  o Whole wheat toast or whole wheat English muffins with peanut butter & hard boiled egg o Steel cut oats topped with apples & cinnamon and skim milk  o Fresh fruit: banana, strawberries, melon, berries, peaches  o Smoothies: strawberries, bananas, greek yogurt, peanut butter o Low fat greek yogurt with blueberries and granola  o Egg white omelet with spinach and mushrooms o Breakfast couscous: whole wheat couscous, apricots, skim milk, cranberries    Sandwiches:  o Hummus and grilled vegetables (peppers, zucchini, squash) on whole wheat bread   o Grilled chicken on whole wheat pita with lettuce, tomatoes, cucumbers or tzatziki  o Tuna salad on whole wheat bread: tuna salad made with greek yogurt, olives, red peppers, capers, green onions o Garlic rosemary lamb pita: lamb sauted with garlic, rosemary, salt & pepper; add lettuce, cucumber, greek yogurt to pita - flavor with lemon juice and black pepper    Seafood:  o Mediterranean grilled salmon, seasoned with garlic, basil, parsley, lemon juice and black pepper o Shrimp, lemon, and spinach whole-grain pasta salad made with low fat greek yogurt   o Seared scallops with lemon orzo  o Seared tuna steaks seasoned salt, pepper, coriander topped with tomato mixture of olives, tomatoes, olive oil, minced garlic, parsley, green onions and cappers    Meats:  o Herbed greek chicken salad with kalamata olives, cucumber, feta  o Red bell peppers stuffed with spinach, bulgur, lean ground beef (or lentils) & topped with feta   o Kebabs: skewers of chicken, tomatoes, onions, zucchini, squash  o [place] burgers: made with red onions, mint, dill, lemon juice, feta cheese topped with roasted red peppers   Vegetarian o Cucumber salad: cucumbers, artichoke hearts, celery, red onion, feta cheese, tossed in olive oil & lemon juice  o  Hummus and whole grain pita points with a greek salad (lettuce, tomato, feta, olives, cucumbers, red onion) o Lentil soup with celery, carrots made with vegetable broth, garlic, salt and pepper  o Tabouli salad: parsley, bulgur, mint, scallions, cucumbers, tomato, radishes, lemon juice, olive oil, salt and pepper. o

## 2014-06-04 NOTE — Progress Notes (Signed)
Quick Note:  Please notify pt that results are normal.   Provide pt with copy of labs. ______ 

## 2014-12-07 ENCOUNTER — Encounter: Payer: Self-pay | Admitting: Family Medicine

## 2014-12-07 ENCOUNTER — Ambulatory Visit (INDEPENDENT_AMBULATORY_CARE_PROVIDER_SITE_OTHER): Payer: Medicare Other | Admitting: Family Medicine

## 2014-12-07 VITALS — BP 138/80 | HR 92 | Temp 97.8°F | Resp 16 | Ht 68.0 in | Wt 159.4 lb

## 2014-12-07 DIAGNOSIS — I10 Essential (primary) hypertension: Secondary | ICD-10-CM

## 2014-12-07 DIAGNOSIS — E785 Hyperlipidemia, unspecified: Secondary | ICD-10-CM | POA: Diagnosis not present

## 2014-12-07 DIAGNOSIS — Z23 Encounter for immunization: Secondary | ICD-10-CM | POA: Diagnosis not present

## 2014-12-07 LAB — LIPID PANEL
Cholesterol: 180 mg/dL (ref 0–200)
HDL: 64 mg/dL (ref >39)
LDL Cholesterol: 94 mg/dL (ref 0–99)
TRIGLYCERIDES: 109 mg/dL (ref <150)
Total CHOL/HDL Ratio: 2.8 Ratio
VLDL: 22 mg/dL (ref 0–40)

## 2014-12-07 LAB — BASIC METABOLIC PANEL
BUN: 14 mg/dL (ref 6–23)
CALCIUM: 9.4 mg/dL (ref 8.4–10.5)
CHLORIDE: 104 meq/L (ref 96–112)
CO2: 26 meq/L (ref 19–32)
CREATININE: 1.28 mg/dL (ref 0.50–1.35)
GLUCOSE: 80 mg/dL (ref 70–99)
Potassium: 4.4 mEq/L (ref 3.5–5.3)
Sodium: 141 mEq/L (ref 135–145)

## 2014-12-07 MED ORDER — PREDNISONE 20 MG PO TABS: ORAL_TABLET | ORAL | Status: DC

## 2014-12-07 NOTE — Progress Notes (Signed)
Quick Note:  Please notify pt that results are normal.   Provide pt with copy of labs. ______ 

## 2014-12-07 NOTE — Patient Instructions (Signed)
ECZEMA-  Ask your pharmacist about over-the-counter lotion that is good for eczema.  Your next appointment will be in mid to late August 2016. You can call back in the summer to schedule that appointment.

## 2014-12-07 NOTE — Progress Notes (Signed)
S:  This 81 y.o. Male is here for HTN follow-up and medication refills. Chronic eczema is well controlled and quiescent this time of year. Pt requests Prednisone RX to have "just in case". He feels well, is compliant w/ medications and has no adverse effects. He denies diaphoresis, fatigue, abnormal weight change, upper resp problems, CP or tightness, palpitations, SOB or DOE, cough, HA, dizziness, numbness, weakness or syncope. He has no myalgias or back pain associated w/ statin use.  Patient Active Problem List   Diagnosis Date Noted  . Osteoporosis/osteopenia increased risk 06/03/2014  . Basal cell carcinoma of face 06/03/2014  . BPH (benign prostatic hypertrophy) 05/27/2013  . Eczema 04/23/2012  . HTN (hypertension) 04/23/2012  . Dyslipidemia 04/23/2012    Prior to Admission medications   Medication Sig Start Date End Date Taking? Authorizing Provider  lisinopril (PRINIVIL,ZESTRIL) 20 MG tablet Take 1 tablet (20 mg total) by mouth daily. 06/03/14  Yes Barton Fanny, MD  pravastatin (PRAVACHOL) 40 MG tablet TAKE 1 TABLET ONCE DAILY. 06/03/14  Yes Barton Fanny, MD  predniSONE (DELTASONE) 20 MG tablet Take and taper tablets as directed. 12/07/14  Yes Barton Fanny, MD  triamcinolone cream (KENALOG) 0.1 % APPLY TWICE DAILY. 06/03/14  Yes Barton Fanny, MD  Flaxseed, Linseed, (FLAX SEED OIL PO) Take by mouth.    Historical Provider, MD    Past Surgical History  Procedure Laterality Date  . Tonsillectomy  1940 maybe    History   Social History  . Marital Status: Married    Spouse Name: N/A  . Number of Children: N/A  . Years of Education: N/A   Occupational History  . Not on file.   Social History Main Topics  . Smoking status: Never Smoker   . Smokeless tobacco: Not on file  . Alcohol Use: No     Comment: occas  . Drug Use: No  . Sexual Activity: Not on file   Other Topics Concern  . Not on file   Social History Narrative    ROS: As per  HPI.  O: Filed Vitals:   12/07/14 0912  BP: 138/80  Pulse: 92  Temp: 97.8 F (36.6 C)  Resp: 16   GEN: In NAD: WN,WD. HENT: [place]/AT; EOMI w/ clear conj/sclerae. Inner eyelids with mild edema and erythema. Otherwise unremarkable. NECK: Supple w/o LAN or TMG. COR: RRR. Normal S1 and S2 w/o m/g/r. LUNGS: CTA; no wheezes or rhonchi. SKIN: W&D; intact w/ minimal erythema or flakiness. No jaundice but slightly pale complexion. NEURO: A&O x 3; CNs intact. Nonfocal.  A/P: Essential hypertension - Stable and controlled on current medication. Plan: Basic metabolic panel  Dyslipidemia - Stable on statin w/o side effects. Plan: Lipid panel  Need for prophylactic vaccination and inoculation against influenza - Plan: Flu Vaccine QUAD 36+ mos IM  Meds ordered this encounter  Medications  . predniSONE (DELTASONE) 20 MG tablet    Sig: Take and taper tablets as directed.    Dispense:  24 tablet    Refill:  2

## 2015-04-04 DIAGNOSIS — H2513 Age-related nuclear cataract, bilateral: Secondary | ICD-10-CM | POA: Diagnosis not present

## 2015-04-18 DIAGNOSIS — H18411 Arcus senilis, right eye: Secondary | ICD-10-CM | POA: Diagnosis not present

## 2015-04-18 DIAGNOSIS — H2511 Age-related nuclear cataract, right eye: Secondary | ICD-10-CM | POA: Diagnosis not present

## 2015-04-18 DIAGNOSIS — H18412 Arcus senilis, left eye: Secondary | ICD-10-CM | POA: Diagnosis not present

## 2015-04-18 DIAGNOSIS — H02839 Dermatochalasis of unspecified eye, unspecified eyelid: Secondary | ICD-10-CM | POA: Diagnosis not present

## 2015-05-08 DIAGNOSIS — H2511 Age-related nuclear cataract, right eye: Secondary | ICD-10-CM | POA: Diagnosis not present

## 2015-05-08 DIAGNOSIS — H25811 Combined forms of age-related cataract, right eye: Secondary | ICD-10-CM | POA: Diagnosis not present

## 2015-05-09 DIAGNOSIS — H578 Other specified disorders of eye and adnexa: Secondary | ICD-10-CM | POA: Diagnosis not present

## 2015-05-09 DIAGNOSIS — H18461 Peripheral corneal degeneration, right eye: Secondary | ICD-10-CM | POA: Diagnosis not present

## 2015-05-09 DIAGNOSIS — H2512 Age-related nuclear cataract, left eye: Secondary | ICD-10-CM | POA: Diagnosis not present

## 2015-05-29 DIAGNOSIS — D485 Neoplasm of uncertain behavior of skin: Secondary | ICD-10-CM | POA: Diagnosis not present

## 2015-05-29 DIAGNOSIS — B081 Molluscum contagiosum: Secondary | ICD-10-CM | POA: Diagnosis not present

## 2015-05-30 DIAGNOSIS — B081 Molluscum contagiosum: Secondary | ICD-10-CM | POA: Diagnosis not present

## 2015-06-02 ENCOUNTER — Other Ambulatory Visit: Payer: Self-pay | Admitting: Family Medicine

## 2015-06-21 ENCOUNTER — Encounter: Payer: Medicare Other | Admitting: Family Medicine

## 2015-06-24 ENCOUNTER — Other Ambulatory Visit: Payer: Self-pay | Admitting: Family Medicine

## 2015-06-29 HISTORY — PX: CATARACT EXTRACTION, BILATERAL: SHX1313

## 2015-07-25 DIAGNOSIS — C4442 Squamous cell carcinoma of skin of scalp and neck: Secondary | ICD-10-CM | POA: Diagnosis not present

## 2015-07-25 DIAGNOSIS — D485 Neoplasm of uncertain behavior of skin: Secondary | ICD-10-CM | POA: Diagnosis not present

## 2015-07-25 DIAGNOSIS — C44622 Squamous cell carcinoma of skin of right upper limb, including shoulder: Secondary | ICD-10-CM | POA: Diagnosis not present

## 2015-08-01 DIAGNOSIS — H2513 Age-related nuclear cataract, bilateral: Secondary | ICD-10-CM | POA: Diagnosis not present

## 2015-08-01 DIAGNOSIS — H2512 Age-related nuclear cataract, left eye: Secondary | ICD-10-CM | POA: Diagnosis not present

## 2015-08-02 ENCOUNTER — Other Ambulatory Visit: Payer: Self-pay

## 2015-08-02 MED ORDER — TRIAMCINOLONE ACETONIDE 0.1 % EX CREA: TOPICAL_CREAM | CUTANEOUS | Status: DC

## 2015-08-02 MED ORDER — LISINOPRIL 20 MG PO TABS: 20.0000 mg | ORAL_TABLET | Freq: Every day | ORAL | Status: DC

## 2015-08-02 NOTE — Telephone Encounter (Signed)
Pharm called for RF of lisinopril until appt. Gave VO for 1 mos RF.

## 2015-08-11 ENCOUNTER — Ambulatory Visit (INDEPENDENT_AMBULATORY_CARE_PROVIDER_SITE_OTHER): Payer: Medicare Other | Admitting: Family Medicine

## 2015-08-11 ENCOUNTER — Encounter: Payer: Self-pay | Admitting: Family Medicine

## 2015-08-11 VITALS — BP 163/78 | HR 105 | Temp 98.7°F | Resp 16 | Ht 68.0 in | Wt 152.2 lb

## 2015-08-11 DIAGNOSIS — I10 Essential (primary) hypertension: Secondary | ICD-10-CM

## 2015-08-11 DIAGNOSIS — R000 Tachycardia, unspecified: Secondary | ICD-10-CM | POA: Diagnosis not present

## 2015-08-11 DIAGNOSIS — L309 Dermatitis, unspecified: Secondary | ICD-10-CM

## 2015-08-11 DIAGNOSIS — Z23 Encounter for immunization: Secondary | ICD-10-CM

## 2015-08-11 DIAGNOSIS — E785 Hyperlipidemia, unspecified: Secondary | ICD-10-CM | POA: Diagnosis not present

## 2015-08-11 LAB — CBC WITH DIFFERENTIAL/PLATELET
BASOS ABS: 0 10*3/uL (ref 0.0–0.1)
Basophils Relative: 0 % (ref 0–1)
EOS ABS: 0.1 10*3/uL (ref 0.0–0.7)
EOS PCT: 1 % (ref 0–5)
HCT: 46.1 % (ref 39.0–52.0)
Hemoglobin: 16.2 g/dL (ref 13.0–17.0)
LYMPHS ABS: 1.5 10*3/uL (ref 0.7–4.0)
Lymphocytes Relative: 10 % — ABNORMAL LOW (ref 12–46)
MCH: 32.9 pg (ref 26.0–34.0)
MCHC: 35.1 g/dL (ref 30.0–36.0)
MCV: 93.7 fL (ref 78.0–100.0)
MONO ABS: 0.7 10*3/uL (ref 0.1–1.0)
MPV: 9.9 fL (ref 8.6–12.4)
Monocytes Relative: 5 % (ref 3–12)
Neutro Abs: 12.4 10*3/uL — ABNORMAL HIGH (ref 1.7–7.7)
Neutrophils Relative %: 84 % — ABNORMAL HIGH (ref 43–77)
Platelets: 205 10*3/uL (ref 150–400)
RBC: 4.92 MIL/uL (ref 4.22–5.81)
RDW: 13.4 % (ref 11.5–15.5)
WBC: 14.8 10*3/uL — ABNORMAL HIGH (ref 4.0–10.5)

## 2015-08-11 LAB — LIPID PANEL
CHOLESTEROL: 187 mg/dL (ref 125–200)
HDL: 99 mg/dL (ref >=40)
LDL Cholesterol: 63 mg/dL (ref <130)
TRIGLYCERIDES: 124 mg/dL (ref <150)
Total CHOL/HDL Ratio: 1.9 Ratio (ref <=5.0)
VLDL: 25 mg/dL (ref <30)

## 2015-08-11 LAB — COMPREHENSIVE METABOLIC PANEL
ALBUMIN: 3.9 g/dL (ref 3.6–5.1)
ALK PHOS: 60 U/L (ref 40–115)
ALT: 18 U/L (ref 9–46)
AST: 19 U/L (ref 10–35)
BILIRUBIN TOTAL: 1.1 mg/dL (ref 0.2–1.2)
BUN: 24 mg/dL (ref 7–25)
CALCIUM: 9.4 mg/dL (ref 8.6–10.3)
CO2: 29 mmol/L (ref 20–31)
CREATININE: 1.32 mg/dL — AB (ref 0.70–1.11)
Chloride: 103 mmol/L (ref 98–110)
Glucose, Bld: 84 mg/dL (ref 65–99)
Potassium: 4.1 mmol/L (ref 3.5–5.3)
SODIUM: 141 mmol/L (ref 135–146)
TOTAL PROTEIN: 6.6 g/dL (ref 6.1–8.1)

## 2015-08-11 MED ORDER — PRAVASTATIN SODIUM 40 MG PO TABS: ORAL_TABLET | ORAL | Status: DC

## 2015-08-11 MED ORDER — METOPROLOL SUCCINATE ER 25 MG PO TB24: 25.0000 mg | ORAL_TABLET | Freq: Every day | ORAL | Status: DC

## 2015-08-11 MED ORDER — LISINOPRIL 10 MG PO TABS: 10.0000 mg | ORAL_TABLET | Freq: Every day | ORAL | Status: DC

## 2015-08-11 MED ORDER — PREDNISONE 20 MG PO TABS: ORAL_TABLET | ORAL | Status: DC

## 2015-08-11 NOTE — Progress Notes (Signed)
Subjective:    Patient ID: [name], male    DOB: [birth year], 81 y.o.   MRN: [id number]  08/11/2015  Establish Care; Hypertension; and Medication Refill   HPI This 81 y.o. male presents to establish care and for six month follow-up. Previous patient of Dr. Leward Quan.  1. HTN: Patient reports good compliance with medication, good tolerance to medication, and good symptom control.  Not checking BP at home.  No baby ASA.    2.  Hyperlipidemia: Patient reports good compliance with medication, good tolerance to medication, and good symptom control.    3. Basal cell carcinoma:  S/p resection of growth; has several lesions removed.  Must return soon for further resection on 09-11-15.    4. S/p cataract resection B since last visit.  5. S/p crowns placed since last visit.    6. Eczema: chronic issue; B arms.  Uses Prednisone PRN.    No longer riding bike to appointments; now scared of [place]; rode until 46.     Review of Systems  Constitutional: Negative for fever, chills, diaphoresis, activity change, appetite change and fatigue.  Respiratory: Negative for cough and shortness of breath.   Cardiovascular: Negative for chest pain, palpitations and leg swelling.  Gastrointestinal: Negative for nausea, vomiting, abdominal pain and diarrhea.  Endocrine: Negative for cold intolerance, heat intolerance, polydipsia, polyphagia and polyuria.  Skin: Negative for color change, rash and wound.  Neurological: Negative for dizziness, tremors, seizures, syncope, facial asymmetry, speech difficulty, weakness, light-headedness, numbness and headaches.  Psychiatric/Behavioral: Negative for sleep disturbance and dysphoric mood. The patient is not nervous/anxious.     Past Medical History  Diagnosis Date  . Allergy     seasonal  fall  . Hypertension   . Hyperlipidemia   . Eczema   . Cancer ([place])     Basal cell carcinoma; multiple   Past Surgical History  Procedure Laterality Date    . Tonsillectomy  1940 maybe  . Cataract extraction, bilateral  06/29/2015  . Mohs surgery     No Known Allergies  Social History   Social History  . Marital Status: Married    Spouse Name: N/A  . Number of Children: N/A  . Years of Education: N/A   Occupational History  . Not on file.   Social History Main Topics  . Smoking status: Never Smoker   . Smokeless tobacco: Not on file  . Alcohol Use: No     Comment: occas  . Drug Use: No  . Sexual Activity: Not on file   Other Topics Concern  . Not on file   Social History Narrative   Marital status: divorced; good friend with ex-wife      Children: 2 children; 4 grandchildren; no gg      Lives:  Alone in house; daughter six blocks away      Employment: college professor; retired in 1997; history; [place]:  Never      Alcohol: tequila loves but won't buy it.      Exercise:  As much as can; previous competitive biking.      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  No driving; HAS NEVER DRIVEN; has always ridden bike.      Advanced Directives:  DNR; do not resuscitate.  HCPOA: [name] Cummings   Family History  Problem Relation Age of Onset  . Arthritis Mother   . Heart disease Mother 11  .  Hypertension Father        Objective:    BP 163/78 mmHg  Pulse 105  Temp(Src) 98.7 F (37.1 C) (Oral)  Resp 16  Ht 5\' 8"  (1.727 m)  Wt 152 lb 3.2 oz (69.037 kg)  BMI 23.15 kg/m2 Physical Exam  Constitutional: He is oriented to person, place, and time. He appears well-developed and well-nourished. No distress.  HENT:  Head: Normocephalic and atraumatic.  Right Ear: External ear normal.  Left Ear: External ear normal.  Nose: Nose normal.  Mouth/Throat: Oropharynx is clear and moist.  Eyes: Conjunctivae and EOM are normal. Pupils are equal, round, and reactive to light.  Neck: Normal range of motion. Neck supple. Carotid bruit is not present. No thyromegaly present.   Cardiovascular: Regular rhythm, normal heart sounds and intact distal pulses.  Tachycardia present.  Exam reveals no gallop and no friction rub.   No murmur heard. Pulmonary/Chest: Effort normal and breath sounds normal. He has no wheezes. He has no rales.  Abdominal: Soft. Bowel sounds are normal. He exhibits no distension and no mass. There is no tenderness. There is no rebound and no guarding.  Lymphadenopathy:    He has no cervical adenopathy.  Neurological: He is alert and oriented to person, place, and time. No cranial nerve deficit.  Skin: Skin is warm and dry. No rash noted. He is not diaphoretic.  Psychiatric: He has a normal mood and affect. His behavior is normal.  Nursing note and vitals reviewed.   EKG: sinus tachy at 109.    Assessment & Plan:   1. Essential hypertension   2. Hyperlipidemia   3. Need for prophylactic vaccination and inoculation against influenza   4. Tachycardia     1. HTN: uncontrolled; add Metoprolol ER 25mg  daily. Obtain labs. Continue Lisinopril at current dose. 2.  Tachycardia: Uncontrolled and chronic; add Metoprolol ER 25mg  daily.  Obtain labs and EKG. 3.  Hyperlipidemia: controlled; obtain labs; continue medication. 4.  S/p flu vaccine. 5. Eczema: stable; followed by dermatology regularly; refill of Prednisone provided per patient request.   Orders Placed This Encounter  Procedures  . Flu Vaccine QUAD 36+ mos IM  . CBC with Differential/Platelet  . Comprehensive metabolic panel    Order Specific Question:  Has the patient fasted?    Answer:  Yes  . Lipid panel    Order Specific Question:  Has the patient fasted?    Answer:  Yes  . EKG 12-Lead   Meds ordered this encounter  Medications  . DISCONTD: metoprolol succinate (TOPROL-XL) 25 MG 24 hr tablet    Sig: Take 1 tablet (25 mg total) by mouth daily.    Dispense:  90 tablet    Refill:  3  . predniSONE (DELTASONE) 20 MG tablet    Sig: Take and taper tablets as directed.     Dispense:  24 tablet    Refill:  2  . pravastatin (PRAVACHOL) 40 MG tablet    Sig: TAKE 1 TABLET ONCE DAILY.    Dispense:  90 tablet    Refill:  3  . DISCONTD: lisinopril (PRINIVIL,ZESTRIL) 10 MG tablet    Sig: Take 1 tablet (10 mg total) by mouth daily.    Dispense:  90 tablet    Refill:  3    Return in about 4 weeks (around 09/08/2015) for recheck heart rate and blood pressure.     Crista Nuon Elayne Guerin, M.D. [facility] [place], [place]  [address] [phone] phone [phone] fax

## 2015-09-11 DIAGNOSIS — C4442 Squamous cell carcinoma of skin of scalp and neck: Secondary | ICD-10-CM | POA: Diagnosis not present

## 2015-09-11 DIAGNOSIS — C44622 Squamous cell carcinoma of skin of right upper limb, including shoulder: Secondary | ICD-10-CM | POA: Diagnosis not present

## 2015-09-22 ENCOUNTER — Ambulatory Visit (INDEPENDENT_AMBULATORY_CARE_PROVIDER_SITE_OTHER): Payer: Medicare Other | Admitting: Family Medicine

## 2015-09-22 ENCOUNTER — Encounter: Payer: Self-pay | Admitting: Family Medicine

## 2015-09-22 VITALS — BP 142/91 | HR 102 | Temp 98.0°F | Resp 16 | Ht 68.5 in | Wt 156.6 lb

## 2015-09-22 DIAGNOSIS — N189 Chronic kidney disease, unspecified: Secondary | ICD-10-CM | POA: Insufficient documentation

## 2015-09-22 DIAGNOSIS — Z23 Encounter for immunization: Secondary | ICD-10-CM

## 2015-09-22 DIAGNOSIS — C44310 Basal cell carcinoma of skin of unspecified parts of face: Secondary | ICD-10-CM | POA: Diagnosis not present

## 2015-09-22 DIAGNOSIS — N182 Chronic kidney disease, stage 2 (mild): Secondary | ICD-10-CM | POA: Diagnosis not present

## 2015-09-22 DIAGNOSIS — I493 Ventricular premature depolarization: Secondary | ICD-10-CM

## 2015-09-22 DIAGNOSIS — N289 Disorder of kidney and ureter, unspecified: Secondary | ICD-10-CM | POA: Insufficient documentation

## 2015-09-22 DIAGNOSIS — I10 Essential (primary) hypertension: Secondary | ICD-10-CM

## 2015-09-22 DIAGNOSIS — R000 Tachycardia, unspecified: Secondary | ICD-10-CM

## 2015-09-22 MED ORDER — METOPROLOL SUCCINATE ER 50 MG PO TB24: 50.0000 mg | ORAL_TABLET | Freq: Every day | ORAL | Status: DC

## 2015-09-22 MED ORDER — LISINOPRIL 5 MG PO TABS: 5.0000 mg | ORAL_TABLET | Freq: Every day | ORAL | Status: DC

## 2015-09-22 NOTE — Progress Notes (Signed)
Subjective:    Patient ID: [name], male    DOB: October 31, 1931, 81 y.o.   MRN: [id number]  09/22/2015  Follow-up   HPI This 81 y.o. male presents for one month follow-up:  1. HTN/tachycardia:  Added Metoprolol ER 82m daily.  When went for surgery, blood pressure was very low but heart rate was rapid.  Taking Lisinopril 132mdialy and Metoprolol ER 2580maily.  No dizziness. Feels great.  2.  Skin cancer: s/p resection on R hand and L lateral neck.  +skin cancer.  Follow-up on Monday, 09/25/15 for suture removal and evaluation of wounds.  Healing well; would like wounds reviewed and bandaged. Cleaning twice daily with peroxide.  Keeping clean and covered.  3. Health maintenance: due for Pneumovax booster.   Review of Systems  Constitutional: Negative for fever, chills, diaphoresis, activity change, appetite change and fatigue.  Respiratory: Negative for cough and shortness of breath.   Cardiovascular: Negative for chest pain, palpitations and leg swelling.  Gastrointestinal: Negative for nausea, vomiting, abdominal pain and diarrhea.  Endocrine: Negative for cold intolerance, heat intolerance, polydipsia, polyphagia and polyuria.  Skin: Positive for color change and wound. Negative for rash.  Neurological: Negative for dizziness, tremors, seizures, syncope, facial asymmetry, speech difficulty, weakness, light-headedness, numbness and headaches.  Psychiatric/Behavioral: Negative for sleep disturbance and dysphoric mood. The patient is not nervous/anxious.     Past Medical History  Diagnosis Date  . Allergy     seasonal  fall  . Hypertension   . Hyperlipidemia   . Eczema   . Cancer ([facility]   Basal cell carcinoma; multiple   Past Surgical History  Procedure Laterality Date  . Tonsillectomy  1940 maybe  . Cataract extraction, bilateral  06/29/2015  . Mohs surgery     No Known Allergies  Social History   Social History  . Marital Status: Married    Spouse Name: N/A  .  Number of Children: N/A  . Years of Education: N/A   Occupational History  . Not on file.   Social History Main Topics  . Smoking status: Never Smoker   . Smokeless tobacco: Not on file  . Alcohol Use: No     Comment: occas  . Drug Use: No  . Sexual Activity: Not on file   Other Topics Concern  . Not on file   Social History Narrative   Marital status: divorced; good friend with ex-wife      Children: 2 children; 4 grandchildren; no gg      Lives:  Alone in house; daughter six blocks away      Employment: college professor; retired in 1997; [place]; [place]      Alcohol: tequila loves but won't buy it.      Exercise:  As much as can; previous competitive biking.      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  No driving; HAS NEVER DRIVEN; has always ridden bike.      Advanced Directives:  DNR; do not resuscitate.  HCPOA: [name] Cummings   Family History  Problem Relation Age of Onset  . Arthritis Mother   . Heart disease Mother 81 7 Hypertension Father        Objective:    BP 142/91 mmHg  Pulse 102  Temp(Src) 98 F (36.7 C) (Oral)  Resp 16  Ht 5' 8.5" (1.74 m)  Wt 156 lb 9.6 oz (71.033  kg)  BMI 23.46 kg/m2 Physical Exam  Constitutional: He is oriented to person, place, and time. He appears well-developed and well-nourished. No distress.  HENT:  Head: Normocephalic and atraumatic.  Right Ear: External ear normal.  Left Ear: External ear normal.  Nose: Nose normal.  Mouth/Throat: Oropharynx is clear and moist.  Eyes: Conjunctivae and EOM are normal. Pupils are equal, round, and reactive to light.  Neck: Normal range of motion. Neck supple. Carotid bruit is not present. No thyromegaly present.  Cardiovascular: Normal rate, regular rhythm, normal heart sounds and intact distal pulses.   Extrasystoles are present. Exam reveals no gallop and no friction rub.   No murmur heard. Pulmonary/Chest: Effort  normal and breath sounds normal. He has no wheezes. He has no rales.  Abdominal: Soft. Bowel sounds are normal. He exhibits no distension and no mass. There is no tenderness. There is no rebound and no guarding.  Lymphadenopathy:    He has no cervical adenopathy.  Neurological: He is alert and oriented to person, place, and time. No cranial nerve deficit.  Skin: Skin is warm and dry. No rash noted. He is not diaphoretic.  Well healing wound R hand without surrounding erythema, fluctuance, or tenderness.  L lateral neck with well approximated wound; surrounding erythema yet no induration or tenderness or drainage.  Sutures in place in both wounds.  Diffusely dry skin throughout with scaling.  Psychiatric: He has a normal mood and affect. His behavior is normal.  Nursing note and vitals reviewed.  Results for orders placed or performed in visit on 08/11/15  CBC with Differential/Platelet  Result Value Ref Range   WBC 14.8 (H) 4.0 - 10.5 K/uL   RBC 4.92 4.22 - 5.81 MIL/uL   Hemoglobin 16.2 13.0 - 17.0 g/dL   HCT 46.1 39.0 - 52.0 %   MCV 93.7 78.0 - 100.0 fL   MCH 32.9 26.0 - 34.0 pg   MCHC 35.1 30.0 - 36.0 g/dL   RDW 13.4 11.5 - 15.5 %   Platelets 205 150 - 400 K/uL   MPV 9.9 8.6 - 12.4 fL   Neutrophils Relative % 84 (H) 43 - 77 %   Neutro Abs 12.4 (H) 1.7 - 7.7 K/uL   Lymphocytes Relative 10 (L) 12 - 46 %   Lymphs Abs 1.5 0.7 - 4.0 K/uL   Monocytes Relative 5 3 - 12 %   Monocytes Absolute 0.7 0.1 - 1.0 K/uL   Eosinophils Relative 1 0 - 5 %   Eosinophils Absolute 0.1 0.0 - 0.7 K/uL   Basophils Relative 0 0 - 1 %   Basophils Absolute 0.0 0.0 - 0.1 K/uL   Smear Review Criteria for review not met   Comprehensive metabolic panel  Result Value Ref Range   Sodium 141 135 - 146 mmol/L   Potassium 4.1 3.5 - 5.3 mmol/L   Chloride 103 98 - 110 mmol/L   CO2 29 20 - 31 mmol/L   Glucose, Bld 84 65 - 99 mg/dL   BUN 24 7 - 25 mg/dL   Creat 1.32 (H) 0.70 - 1.11 mg/dL   Total Bilirubin 1.1 0.2  - 1.2 mg/dL   Alkaline Phosphatase 60 40 - 115 U/L   AST 19 10 - 35 U/L   ALT 18 9 - 46 U/L   Total Protein 6.6 6.1 - 8.1 g/dL   Albumin 3.9 3.6 - 5.1 g/dL   Calcium 9.4 8.6 - 10.3 mg/dL  Lipid panel  Result Value Ref Range  Cholesterol 187 125 - 200 mg/dL   Triglycerides 124 <150 mg/dL   HDL 99 >=40 mg/dL   Total CHOL/HDL Ratio 1.9 <=5.0 Ratio   VLDL 25 <30 mg/dL   LDL Cholesterol 63 <130 mg/dL   EKG: NSR: frequent PVCs.    Assessment & Plan:   1. Essential hypertension   2. Tachycardia   3. Chronic renal insufficiency, stage 2 (mild)   4. Basal cell carcinoma of face   5. Need for pneumococcal vaccination    -Improved on Metoprolol ER; will increase Metoprolol ER to 73m daily. -Decrease Lisinopril to 534mdaily; warrants continued ACE-I due to renal insufficiency. -Dressed wound on R hand; no evidence of secondary infection. Follow-up with derm on Monday, 11/28. -s/p Pneumovax booster.    Orders Placed This Encounter  Procedures  . Pneumococcal polysaccharide vaccine 23-valent greater than or equal to 2yo subcutaneous/IM  . EKG 12-Lead   Meds ordered this encounter  Medications  . metoprolol succinate (TOPROL-XL) 50 MG 24 hr tablet    Sig: Take 1 tablet (50 mg total) by mouth daily.    Dispense:  90 tablet    Refill:  3  . lisinopril (PRINIVIL,ZESTRIL) 5 MG tablet    Sig: Take 1 tablet (5 mg total) by mouth daily.    Dispense:  90 tablet    Refill:  3    Return in about 2 months (around 11/22/2015) for recheck heart rate and blood pressure.    Soloman Mckeithan MaElayne GuerinM.D. [facility]  [phone] [phone]

## 2015-11-24 ENCOUNTER — Ambulatory Visit (INDEPENDENT_AMBULATORY_CARE_PROVIDER_SITE_OTHER): Payer: Medicare Other | Admitting: Family Medicine

## 2015-11-24 ENCOUNTER — Encounter: Payer: Self-pay | Admitting: Family Medicine

## 2015-11-24 VITALS — BP 139/75 | HR 60 | Temp 98.1°F | Resp 16 | Ht 68.0 in | Wt 152.4 lb

## 2015-11-24 DIAGNOSIS — I10 Essential (primary) hypertension: Secondary | ICD-10-CM | POA: Diagnosis not present

## 2015-11-24 DIAGNOSIS — R000 Tachycardia, unspecified: Secondary | ICD-10-CM

## 2015-11-24 DIAGNOSIS — I493 Ventricular premature depolarization: Secondary | ICD-10-CM

## 2015-11-24 MED ORDER — PREDNISONE 20 MG PO TABS: ORAL_TABLET | ORAL | Status: DC

## 2015-11-24 NOTE — Progress Notes (Signed)
Subjective:    Patient ID: [name], male    DOB: Jan 13, 1932, 82 y.o.   MRN: [id number]  11/24/2015  Follow-up   HPI This 82 y.o. male presents for two month follow-up:  1.  Falls: has suffered with a fall recently.  Stopped biking.  Walks to [employer] but afraid to cross [employer].    2.  HTN: Patient reports good compliance with medication, good tolerance to medication, and good symptom control.    3.  Tachycardia/PVCs:  Patient reports good compliance with medication, good tolerance to medication, and good symptom control.     4.  Eczema:  Requesting refill of Prednisone; using triamcinolone. Review of Systems  Constitutional: Negative for fever, chills, diaphoresis, activity change, appetite change and fatigue.  Eyes: Negative for visual disturbance.  Respiratory: Negative for cough and shortness of breath.   Cardiovascular: Negative for chest pain, palpitations and leg swelling.  Endocrine: Negative for cold intolerance, heat intolerance, polydipsia, polyphagia and polyuria.  Neurological: Negative for dizziness, tremors, seizures, syncope, facial asymmetry, speech difficulty, weakness, light-headedness, numbness and headaches.    Past Medical History  Diagnosis Date  . Allergy     seasonal  fall  . Hypertension   . Hyperlipidemia   . Eczema   . Cancer ([place])     Basal cell carcinoma; multiple   Past Surgical History  Procedure Laterality Date  . Tonsillectomy  1940 maybe  . Cataract extraction, bilateral  06/29/2015  . Mohs surgery     No Known Allergies Current Outpatient Prescriptions  Medication Sig Dispense Refill  . lisinopril (PRINIVIL,ZESTRIL) 5 MG tablet Take 1 tablet (5 mg total) by mouth daily. 90 tablet 3  . metoprolol succinate (TOPROL-XL) 50 MG 24 hr tablet Take 1 tablet (50 mg total) by mouth daily. 90 tablet 3  . pravastatin (PRAVACHOL) 40 MG tablet TAKE 1 TABLET ONCE DAILY. 90 tablet 3  . triamcinolone cream (KENALOG) 0.1 % APPLY TWICE  DAILY. 453.6 g 3  . predniSONE (DELTASONE) 20 MG tablet Take and taper tablets as directed. (Patient not taking: Reported on 11/24/2015) 24 tablet 2   No current facility-administered medications for this visit.   Social History   Social History  . Marital Status: Married    Spouse Name: N/A  . Number of Children: N/A  . Years of Education: N/A   Occupational History  . Not on file.   Social History Main Topics  . Smoking status: Never Smoker   . Smokeless tobacco: Not on file  . Alcohol Use: No     Comment: occas  . Drug Use: No  . Sexual Activity: Not on file   Other Topics Concern  . 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      Exercise:  As much as can; previous competitive biking.      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  No driving; HAS NEVER DRIVEN; has always ridden bike.      Advanced Directives:  DNR; do not resuscitate.  HCPOA: [name] Cummings   Family History  Problem Relation Age of Onset  . Arthritis Mother   . Heart disease Mother 10  . Hypertension Father        Objective:    BP 139/75 mmHg  Pulse 60  Temp(Src) 98.1 F (36.7 C) (Oral)  Resp 16  Ht _0  (1.727 m)  Wt 152 lb 6.4 oz (69.128 kg)  BMI 23.18 kg/m2  SpO2 98% Physical Exam  Constitutional: He is oriented to person, place, and time. He appears well-developed and well-nourished. No distress.  HENT:  Head: Normocephalic and atraumatic.  Right Ear: External ear normal.  Left Ear: External ear normal.  Nose: Nose normal.  Mouth/Throat: Oropharynx is clear and moist.  Eyes: Conjunctivae and EOM are normal. Pupils are equal, round, and reactive to  light.  Neck: Normal range of motion. Neck supple. Carotid bruit is not present. No thyromegaly present.  Cardiovascular: Normal rate, regular rhythm, normal heart sounds and intact distal pulses.  Exam reveals no gallop and no friction rub.   No murmur heard. Pulmonary/Chest: Effort normal and breath sounds normal. He has no wheezes. He has no rales.  Abdominal: Soft. Bowel sounds are normal. He exhibits no distension and no mass. There is no tenderness. There is no rebound and no guarding.  Lymphadenopathy:    He has no cervical adenopathy.  Neurological: He is alert and oriented to person, place, and time. No cranial nerve deficit.  Skin: Skin is warm and dry. No rash noted. He is not diaphoretic.  Psychiatric: He has a normal mood and affect. His behavior is normal.  Nursing note and vitals reviewed.  Results for orders placed or performed in visit on 08/11/15  CBC with Differential/Platelet  Result Value Ref Range   WBC 14.8 (H) 4.0 - 10.5 K/uL   RBC 4.92 4.22 - 5.81 MIL/uL   Hemoglobin 16.2 13.0 - 17.0 g/dL   HCT 46.1 39.0 - 52.0 %   MCV 93.7 78.0 - 100.0 fL   MCH 32.9 26.0 - 34.0 pg   MCHC 35.1 30.0 - 36.0 g/dL   RDW 13.4 11.5 - 15.5 %   Platelets 205 150 - 400 K/uL   MPV 9.9 8.6 - 12.4 fL   Neutrophils Relative % 84 (H) 43 - 77 %   Neutro Abs 12.4 (H) 1.7 - 7.7 K/uL   Lymphocytes Relative 10 (L) 12 - 46 %   Lymphs Abs 1.5 0.7 - 4.0 K/uL   Monocytes Relative 5 3 - 12 %   Monocytes Absolute 0.7 0.1 - 1.0 K/uL   Eosinophils Relative 1 0 - 5 %   Eosinophils Absolute 0.1 0.0 - 0.7 K/uL   Basophils Relative 0 0 - 1 %   Basophils Absolute 0.0 0.0 - 0.1 K/uL   Smear Review Criteria for review not met   Comprehensive metabolic panel  Result Value Ref Range   Sodium 141 135 - 146 mmol/L   Potassium 4.1 3.5 - 5.3 mmol/L   Chloride 103 98 - 110 mmol/L   CO2 29 20 - 31 mmol/L   Glucose, Bld 84 65 - 99 mg/dL   BUN 24 7 - 25 mg/dL   Creat 1.32 (H) 0.70 - 1.11 mg/dL   Total  Bilirubin 1.1 0.2 - 1.2 mg/dL   Alkaline Phosphatase 60 40 - 115 U/L   AST 19 10 - 35 U/L   ALT 18 9 - 46 U/L   Total Protein 6.6 6.1 - 8.1 g/dL   Albumin 3.9 3.6 - 5.1 g/dL  Calcium 9.4 8.6 - 10.3 mg/dL  Lipid panel  Result Value Ref Range   Cholesterol 187 125 - 200 mg/dL   Triglycerides 124 <150 mg/dL   HDL 99 >=40 mg/dL   Total CHOL/HDL Ratio 1.9 <=5.0 Ratio   VLDL 25 <30 mg/dL   LDL Cholesterol 63 <130 mg/dL       Assessment & Plan:   1. Essential hypertension   2. Tachycardia   3. PVC (premature ventricular contraction)     Orders Placed This Encounter  Procedures  . EKG 12-Lead   No orders of the defined types were placed in this encounter.    Return in about 3 months (around 02/22/2016) for complete physical examiniation.    Kristi Elayne Guerin, M.D. [facility] [address] [place], [place]  [address] [phone] phone [phone] fax

## 2016-03-19 ENCOUNTER — Encounter: Payer: Self-pay | Admitting: Family Medicine

## 2016-03-19 ENCOUNTER — Ambulatory Visit: Payer: Medicare Other

## 2016-03-19 ENCOUNTER — Ambulatory Visit (INDEPENDENT_AMBULATORY_CARE_PROVIDER_SITE_OTHER): Payer: Medicare Other

## 2016-03-19 ENCOUNTER — Ambulatory Visit (INDEPENDENT_AMBULATORY_CARE_PROVIDER_SITE_OTHER): Payer: Medicare Other | Admitting: Family Medicine

## 2016-03-19 VITALS — BP 148/90 | HR 77 | Temp 98.7°F | Resp 20 | Ht 68.0 in | Wt 147.2 lb

## 2016-03-19 DIAGNOSIS — N400 Enlarged prostate without lower urinary tract symptoms: Secondary | ICD-10-CM

## 2016-03-19 DIAGNOSIS — J9801 Acute bronchospasm: Secondary | ICD-10-CM | POA: Diagnosis not present

## 2016-03-19 DIAGNOSIS — C44310 Basal cell carcinoma of skin of unspecified parts of face: Secondary | ICD-10-CM

## 2016-03-19 DIAGNOSIS — N182 Chronic kidney disease, stage 2 (mild): Secondary | ICD-10-CM

## 2016-03-19 DIAGNOSIS — Z87898 Personal history of other specified conditions: Secondary | ICD-10-CM | POA: Diagnosis not present

## 2016-03-19 DIAGNOSIS — E785 Hyperlipidemia, unspecified: Secondary | ICD-10-CM

## 2016-03-19 DIAGNOSIS — Z9189 Other specified personal risk factors, not elsewhere classified: Secondary | ICD-10-CM

## 2016-03-19 DIAGNOSIS — R918 Other nonspecific abnormal finding of lung field: Secondary | ICD-10-CM | POA: Diagnosis not present

## 2016-03-19 DIAGNOSIS — I10 Essential (primary) hypertension: Secondary | ICD-10-CM | POA: Diagnosis not present

## 2016-03-19 DIAGNOSIS — I493 Ventricular premature depolarization: Secondary | ICD-10-CM

## 2016-03-19 DIAGNOSIS — R000 Tachycardia, unspecified: Secondary | ICD-10-CM

## 2016-03-19 LAB — POCT URINALYSIS DIP (MANUAL ENTRY)
BILIRUBIN UA: NEGATIVE
GLUCOSE UA: NEGATIVE
Ketones, POC UA: NEGATIVE
LEUKOCYTES UA: NEGATIVE
NITRITE UA: NEGATIVE
PH UA: 6
RBC UA: NEGATIVE
Spec Grav, UA: 1.02
UROBILINOGEN UA: 1

## 2016-03-19 LAB — CBC WITH DIFFERENTIAL/PLATELET
BASOS ABS: 0 {cells}/uL (ref 0–200)
BASOS PCT: 0 %
EOS PCT: 9 %
Eosinophils Absolute: 900 cells/uL — ABNORMAL HIGH (ref 15–500)
HCT: 47.8 % (ref 38.5–50.0)
HEMOGLOBIN: 16 g/dL (ref 13.2–17.1)
LYMPHS ABS: 1600 {cells}/uL (ref 850–3900)
Lymphocytes Relative: 16 %
MCH: 30.8 pg (ref 27.0–33.0)
MCHC: 33.5 g/dL (ref 32.0–36.0)
MCV: 91.9 fL (ref 80.0–100.0)
MONOS PCT: 7 %
MPV: 10.5 fL (ref 7.5–12.5)
Monocytes Absolute: 700 cells/uL (ref 200–950)
Neutro Abs: 6800 cells/uL (ref 1500–7800)
Neutrophils Relative %: 68 %
PLATELETS: 210 10*3/uL (ref 140–400)
RBC: 5.2 MIL/uL (ref 4.20–5.80)
RDW: 13.7 % (ref 11.0–15.0)
WBC: 10 10*3/uL (ref 3.8–10.8)

## 2016-03-19 LAB — COMPREHENSIVE METABOLIC PANEL
ALBUMIN: 4.1 g/dL (ref 3.6–5.1)
ALT: 11 U/L (ref 9–46)
AST: 18 U/L (ref 10–35)
Alkaline Phosphatase: 86 U/L (ref 40–115)
BILIRUBIN TOTAL: 1.2 mg/dL (ref 0.2–1.2)
BUN: 13 mg/dL (ref 7–25)
CHLORIDE: 102 mmol/L (ref 98–110)
CO2: 28 mmol/L (ref 20–31)
CREATININE: 1.07 mg/dL (ref 0.70–1.11)
Calcium: 9.7 mg/dL (ref 8.6–10.3)
Glucose, Bld: 85 mg/dL (ref 65–99)
Potassium: 4.3 mmol/L (ref 3.5–5.3)
SODIUM: 140 mmol/L (ref 135–146)
TOTAL PROTEIN: 6.9 g/dL (ref 6.1–8.1)

## 2016-03-19 LAB — LIPID PANEL
CHOLESTEROL: 164 mg/dL (ref 125–200)
HDL: 66 mg/dL (ref >=40)
LDL CALC: 84 mg/dL (ref <130)
TRIGLYCERIDES: 72 mg/dL (ref <150)
Total CHOL/HDL Ratio: 2.5 Ratio (ref <=5.0)
VLDL: 14 mg/dL (ref <30)

## 2016-03-19 MED ORDER — LEVALBUTEROL HCL 0.63 MG/3ML IN NEBU
0.6300 mg | INHALATION_SOLUTION | Freq: Once | RESPIRATORY_TRACT | Status: AC
  Administered 2016-03-19: 0.63 mg via RESPIRATORY_TRACT

## 2016-03-19 MED ORDER — PREDNISONE 20 MG PO TABS: ORAL_TABLET | ORAL | Status: DC

## 2016-03-19 NOTE — Patient Instructions (Signed)
     IF you received an x-ray today, you will receive an invoice from [facility]. Please contact [facility] at [phone] with questions or concerns regarding your invoice.   IF you received labwork today, you will receive an invoice from [facility]. Please contact [facility] at [phone] with questions or concerns regarding your invoice.   Our billing staff will not be able to assist you with questions regarding bills from these companies.  You will be contacted with the lab results as soon as they are available. The fastest way to get your results is to activate your My Chart account. Instructions are located on the last page of this paperwork. If you have not heard from us regarding the results in 2 weeks, please contact this office.      

## 2016-03-19 NOTE — Progress Notes (Signed)
Subjective:    Patient ID: [name], male    DOB: Oct 11, 1932, 82 y.o.   MRN: [id number]  03/19/2016  Hypertension   HPI This 82 y.o. male presents for four month follow-up:   1.  HTN: Patient reports good compliance with medication, good tolerance to medication, and good symptom control.  Lisinopril 5mg  daily.  Not hcecking BP at home.  2.  Hyperlipidemia: Patient reports good compliance with medication, good tolerance to medication, and good symptom control.    3.  Tachycardia: Patient reports good compliance with medication, good tolerance to medication, and good symptom control.    4.  Eczema: flare ups lately.  Requesting refill of Prednisone.  Drinking Ulon tea which helps.    Going to beach with family in three months; going to [place] in [place].    5.  Allergic rhinitis/asthma: +chilhood asthma.  Ignores symptoms.  Believes in [place] scientist to a point.     Review of Systems  Constitutional: Negative for fever, chills, diaphoresis, activity change, appetite change and fatigue.  Respiratory: Negative for cough and shortness of breath.   Cardiovascular: Negative for chest pain, palpitations and leg swelling.  Gastrointestinal: Negative for nausea, vomiting, abdominal pain and diarrhea.  Endocrine: Negative for cold intolerance, heat intolerance, polydipsia, polyphagia and polyuria.  Skin: Negative for color change, rash and wound.  Neurological: Negative for dizziness, tremors, seizures, syncope, facial asymmetry, speech difficulty, weakness, light-headedness, numbness and headaches.  Psychiatric/Behavioral: Negative for sleep disturbance and dysphoric mood. The patient is not nervous/anxious.     Past Medical History  Diagnosis Date  . Allergy     seasonal  fall  . Hypertension   . Hyperlipidemia   . Eczema   . Cancer ([place])     Basal cell carcinoma; multiple   Past Surgical History  Procedure Laterality Date  . Tonsillectomy  1940 maybe  . Cataract  extraction, bilateral  06/29/2015  . Mohs surgery     Not on File  Social History   Social History  . Marital Status: Married    Spouse Name: N/A  . Number of Children: N/A  . Years of Education: N/A   Occupational History  . retired    Social History Main Topics  . Smoking status: Never Smoker   . Smokeless tobacco: Not on file  . Alcohol Use: No     Comment: occas  . Drug Use: No  . Sexual Activity: Not Currently   Other Topics Concern  . Not on file   Social History Narrative   Marital status: divorced; good friend with ex-wife      Children: 2 children; 4 grandchildren; no gg      Lives:  Alone in house; daughter six blocks away      Employment: college professor; retired in 1997; history; [place]:  Never      Alcohol: tequila loves but won't buy it.      Exercise:  As much as can; previous competitive biking.      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  No driving; HAS NEVER DRIVEN; has always ridden bike.  Daughter takes patient to grocery store and to appointments.        Advanced Directives:  DNR; do not resuscitate.  HCPOA: [name] Cummings   Family History  Problem Relation Age of Onset  . Arthritis Mother   . Heart disease Mother 36  . Hypertension Father  Objective:    BP 148/90 mmHg  Pulse 77  Temp(Src) 98.7 F (37.1 C) (Oral)  Resp 20  Ht 5\' 8"  (1.727 m)  Wt 147 lb 3.2 oz (66.769 kg)  BMI 22.39 kg/m2  SpO2 96% Physical Exam  Constitutional: He is oriented to person, place, and time. He appears well-developed and well-nourished. No distress.  HENT:  Head: Normocephalic and atraumatic.  Right Ear: External ear normal.  Left Ear: External ear normal.  Nose: Nose normal.  Mouth/Throat: Oropharynx is clear and moist.  Eyes: Conjunctivae and EOM are normal. Pupils are equal, round, and reactive to light.  Neck: Normal range of motion. Neck supple. Carotid bruit is not present. No  thyromegaly present.  Cardiovascular: Normal rate, regular rhythm, normal heart sounds and intact distal pulses.  Exam reveals no gallop and no friction rub.   No murmur heard. Pulmonary/Chest: Effort normal. He has wheezes. He has no rales.  Abdominal: Soft. Bowel sounds are normal. He exhibits no distension and no mass. There is no tenderness. There is no rebound and no guarding.  Lymphadenopathy:    He has no cervical adenopathy.  Neurological: He is alert and oriented to person, place, and time. No cranial nerve deficit.  Skin: Skin is warm and dry. No rash noted. He is not diaphoretic.  Psychiatric: He has a normal mood and affect. His behavior is normal.  Nursing note and vitals reviewed.       Assessment & Plan:   1. Essential hypertension   2. Dyslipidemia   3. BPH (benign prostatic hypertrophy)   4. Osteoporosis/osteopenia increased risk   5. Basal cell carcinoma of face   6. Tachycardia   7. PVC (premature ventricular contraction)   8. Chronic renal insufficiency, stage 2 (mild)   9. Bronchospasm     Orders Placed This Encounter  Procedures  . DG Chest 2 View    Standing Status: Future     Number of Occurrences: 1     Standing Expiration Date: 03/19/2017    Order Specific Question:  Reason for Exam (SYMPTOM  OR DIAGNOSIS REQUIRED)    Answer:  wheezing    Order Specific Question:  Preferred imaging location?    Answer:  External  . CBC with Differential/Platelet  . Comprehensive metabolic panel    Order Specific Question:  Has the patient fasted?    Answer:  Yes  . Lipid panel    Order Specific Question:  Has the patient fasted?    Answer:  Yes  . POCT urinalysis dipstick   Meds ordered this encounter  Medications  . levalbuterol (XOPENEX) nebulizer solution 0.63 mg    Sig:   . predniSONE (DELTASONE) 20 MG tablet    Sig: Take and taper tablets as directed.    Dispense:  20 tablet    Refill:  0    Return in about 6 months (around 09/19/2016) for recheck  blood pressure, high cholesterol.    Kristi Elayne Guerin, M.D. [facility] [address] [place], [place]  [address] [phone] phone [phone] fax

## 2016-04-14 ENCOUNTER — Encounter: Payer: Self-pay | Admitting: Family Medicine

## 2016-06-11 ENCOUNTER — Encounter: Payer: Self-pay | Admitting: Family Medicine

## 2016-06-11 ENCOUNTER — Ambulatory Visit (INDEPENDENT_AMBULATORY_CARE_PROVIDER_SITE_OTHER): Payer: Medicare Other | Admitting: Family Medicine

## 2016-06-11 VITALS — BP 122/70 | HR 70 | Temp 98.0°F | Resp 18 | Ht 68.0 in | Wt 148.0 lb

## 2016-06-11 DIAGNOSIS — Z9189 Other specified personal risk factors, not elsewhere classified: Secondary | ICD-10-CM

## 2016-06-11 DIAGNOSIS — L309 Dermatitis, unspecified: Secondary | ICD-10-CM | POA: Diagnosis not present

## 2016-06-11 DIAGNOSIS — E785 Hyperlipidemia, unspecified: Secondary | ICD-10-CM | POA: Diagnosis not present

## 2016-06-11 DIAGNOSIS — Z87898 Personal history of other specified conditions: Secondary | ICD-10-CM

## 2016-06-11 DIAGNOSIS — C44310 Basal cell carcinoma of skin of unspecified parts of face: Secondary | ICD-10-CM

## 2016-06-11 DIAGNOSIS — I493 Ventricular premature depolarization: Secondary | ICD-10-CM | POA: Diagnosis not present

## 2016-06-11 DIAGNOSIS — I10 Essential (primary) hypertension: Secondary | ICD-10-CM

## 2016-06-11 DIAGNOSIS — N182 Chronic kidney disease, stage 2 (mild): Secondary | ICD-10-CM | POA: Diagnosis not present

## 2016-06-11 DIAGNOSIS — R000 Tachycardia, unspecified: Secondary | ICD-10-CM

## 2016-06-11 DIAGNOSIS — Z0000 Encounter for general adult medical examination without abnormal findings: Secondary | ICD-10-CM

## 2016-06-11 LAB — POCT URINALYSIS DIP (MANUAL ENTRY)
BILIRUBIN UA: NEGATIVE
BILIRUBIN UA: NEGATIVE
Glucose, UA: NEGATIVE
Leukocytes, UA: NEGATIVE
Nitrite, UA: NEGATIVE
PH UA: 5.5
Protein Ur, POC: NEGATIVE
RBC UA: NEGATIVE
Spec Grav, UA: 1.02
Urobilinogen, UA: 0.2

## 2016-06-11 LAB — COMPREHENSIVE METABOLIC PANEL
ALT: 13 U/L (ref 9–46)
AST: 17 U/L (ref 10–35)
Albumin: 4.1 g/dL (ref 3.6–5.1)
Alkaline Phosphatase: 61 U/L (ref 40–115)
BILIRUBIN TOTAL: 1 mg/dL (ref 0.2–1.2)
BUN: 17 mg/dL (ref 7–25)
CALCIUM: 9.7 mg/dL (ref 8.6–10.3)
CO2: 27 mmol/L (ref 20–31)
Chloride: 101 mmol/L (ref 98–110)
Creat: 1.22 mg/dL — ABNORMAL HIGH (ref 0.70–1.11)
GLUCOSE: 110 mg/dL — AB (ref 65–99)
Potassium: 4.1 mmol/L (ref 3.5–5.3)
SODIUM: 138 mmol/L (ref 135–146)
Total Protein: 7.2 g/dL (ref 6.1–8.1)

## 2016-06-11 LAB — CBC WITH DIFFERENTIAL/PLATELET
Basophils Absolute: 0 cells/uL (ref 0–200)
Basophils Relative: 0 %
Eosinophils Absolute: 0 cells/uL — ABNORMAL LOW (ref 15–500)
Eosinophils Relative: 0 %
HEMATOCRIT: 46.9 % (ref 38.5–50.0)
Hemoglobin: 15.8 g/dL (ref 13.2–17.1)
LYMPHS ABS: 1638 {cells}/uL (ref 850–3900)
MCH: 32 pg (ref 27.0–33.0)
MCHC: 33.7 g/dL (ref 32.0–36.0)
MCV: 94.9 fL (ref 80.0–100.0)
MONO ABS: 936 {cells}/uL (ref 200–950)
MPV: 10.6 fL (ref 7.5–12.5)
Monocytes Relative: 8 %
Neutro Abs: 9126 cells/uL — ABNORMAL HIGH (ref 1500–7800)
Neutrophils Relative %: 78 %
Platelets: 212 10*3/uL (ref 140–400)
RBC: 4.94 MIL/uL (ref 4.20–5.80)
RDW: 13.1 % (ref 11.0–15.0)
WBC: 11.7 10*3/uL — AB (ref 3.8–10.8)

## 2016-06-11 LAB — LIPID PANEL
CHOL/HDL RATIO: 2.7 ratio (ref <=5.0)
Cholesterol: 204 mg/dL — ABNORMAL HIGH (ref 125–200)
HDL: 75 mg/dL (ref >=40)
LDL CALC: 110 mg/dL (ref <130)
Triglycerides: 93 mg/dL (ref <150)
VLDL: 19 mg/dL (ref <30)

## 2016-06-11 LAB — POC MICROSCOPIC URINALYSIS (UMFC): Mucus: ABSENT

## 2016-06-11 MED ORDER — PREDNISONE 20 MG PO TABS: ORAL_TABLET | ORAL | 0 refills | Status: DC

## 2016-06-11 MED ORDER — PRAVASTATIN SODIUM 40 MG PO TABS: ORAL_TABLET | ORAL | 3 refills | Status: DC

## 2016-06-11 MED ORDER — LISINOPRIL 5 MG PO TABS: 5.0000 mg | ORAL_TABLET | Freq: Every day | ORAL | 3 refills | Status: DC

## 2016-06-11 MED ORDER — METOPROLOL SUCCINATE ER 50 MG PO TB24: 50.0000 mg | ORAL_TABLET | Freq: Every day | ORAL | 3 refills | Status: DC

## 2016-06-11 NOTE — Patient Instructions (Addendum)
   IF you received an x-ray today, you will receive an invoice from [facility]. Please contact  Radiology at [phone] with questions or concerns regarding your invoice.   IF you received labwork today, you will receive an invoice from [facility]. Please contact [facility] at [phone] with questions or concerns regarding your invoice.   Our billing staff will not be able to assist you with questions regarding bills from these companies.  You will be contacted with the lab results as soon as they are available. The fastest way to get your results is to activate your My Chart account. Instructions are located on the last page of this paperwork. If you have not heard from us regarding the results in 2 weeks, please contact this office.    Keeping you healthy  Get these tests  Blood pressure- Have your blood pressure checked once a year by your healthcare provider.  Normal blood pressure is 120/80  Weight- Have your body mass index (BMI) calculated to screen for obesity.  BMI is a measure of body fat based on height and weight. You can also calculate your own BMI at www.[facility]hl[place].com/bmi/.  Cholesterol- Have your cholesterol checked every year.  Diabetes- Have your blood sugar checked regularly if you have high blood pressure, high cholesterol, have a family history of diabetes or if you are overweight.  Screening for Colon Cancer- Colonoscopy starting at age 50.  Screening may begin sooner depending on your family history and other health conditions. Follow up colonoscopy as directed by your Gastroenterologist.  Screening for Prostate Cancer- Both blood work (PSA) and a rectal exam help screen for Prostate Cancer.  Screening begins at age 40 with African-American men and at age 50 with Caucasian men.  Screening may begin sooner depending on your family history.  Take these medicines  Aspirin- One aspirin daily can help prevent Heart  disease and Stroke.  Flu shot- Every fall.  Tetanus- Every 10 years.  Zostavax- Once after the age of 60 to prevent Shingles.  Pneumonia shot- Once after the age of 65; if you are younger than 65, ask your healthcare provider if you need a Pneumonia shot.  Take these steps  Don't smoke- If you do smoke, talk to your doctor about quitting.  For tips on how to quit, go to www.smokefree.gov or call 1-800-QUIT-NOW.  Be physically active- Exercise 5 days a week for at least 30 minutes.  If you are not already physically active start slow and gradually work up to 30 minutes of moderate physical activity.  Examples of moderate activity include walking briskly, mowing the yard, dancing, swimming, bicycling, etc.  Eat a healthy diet- Eat a variety of healthy food such as fruits, vegetables, low fat milk, low fat cheese, yogurt, lean meant, poultry, fish, beans, tofu, etc. For more information go to www.thenutritionsource.org  Drink alcohol in moderation- Limit alcohol intake to less than two drinks a day. Never drink and drive.  Dentist- Brush and floss twice daily; visit your dentist twice a year.  Depression- Your emotional health is as important as your physical health. If you're feeling down, or losing interest in things you would normally enjoy please talk to your healthcare provider.  Eye exam- Visit your eye doctor every year.  Safe sex- If you may be exposed to a sexually transmitted infection, use a condom.  Seat belts- Seat belts can save your life; always wear one.  Smoke/Carbon Monoxide detectors- These detectors need to be installed on   the appropriate level of your home.  Replace batteries at least once a year.  Skin cancer- When out in the sun, cover up and use sunscreen 15 SPF or higher.  Violence- If anyone is threatening you, please tell your healthcare provider.  Living Will/ Health care power of attorney- Speak with your healthcare provider and family. 

## 2016-06-11 NOTE — Progress Notes (Signed)
Subjective:    Patient ID: [name], male    DOB: April 25, 1932, 82 y.o.   MRN: [id number]  By signing my name below, I, [name], attest that this documentation has been prepared under the direction and in the presence of [name], MD. Electronically Signed: Judithe Moore, ER Scribe. 06/11/2016. 11:02 AM.  06/11/2016  Annual Exam  HPI  HPI Comments: [name] is a 82 y.o. male who presents to [facility] reporting for an Annual Wellness Examination and follow-up of chronic medical conditions. He has never gotten a colonoscopy, and does not want one. He has had two root canals recently. He has also had cataract surgery within the last year. He has not seen the dermatologist in many years. He has a sister who is 25 years old that has no significant health issues. His wife died last year. He lives alone, but his daughter lives nearby. He cooks, cleans and pays all his own bills. He has a DNR. He has [place]. He is taking all his regular medications. He does not take aspirin daily.   He fell out of bed while he was sleeping last week. He was having a dream that he was in a right. This has happened to him several times. He has some bruising on his arm and around the far left edge of his eye orbit. He denies HA, ringing in ears, blurred vision, double vision, sores in mouth, chest pain, SOB, cough, shoulder pain, neck pain, diarrhea, constipation, heart burn, nausea, vomiting. His urine stream steady and is about the same as it has always been. He spends his days reading and listening to [facility]. He is having a daily BM. His rash is persistent. He goes on regular walks.   He did not get to say goodbye to the love of his life which weighs on him.   Last physical 06/03/2014. He got his pneumonia vaccine on 08/29/2015.   Depression screen [facility] 2/9 06/11/2016 06/11/2016 03/19/2016 11/24/2015 09/22/2015  Decreased Interest 0 0 0 0 0  Down, Depressed, Hopeless 0 0 0 0 0  PHQ - 2 Score 0 0 0 0 0    Functional Status Survey: Is the patient deaf or have difficulty hearing?: No Does the patient have difficulty seeing, even when wearing glasses/contacts?: No Does the patient have difficulty concentrating, remembering, or making decisions?: No Does the patient have difficulty walking or climbing stairs?: No Does the patient have difficulty dressing or bathing?: No Does the patient have difficulty doing errands alone such as visiting a doctor's office or shopping?: No   Immunization History  Administered Date(s) Administered  . Influenza, Seasonal, Injecte, Preservative Fre 10/14/2012  . Influenza,inj,Quad PF,36+ Mos 12/07/2014, 08/11/2015  . Pneumococcal Conjugate-13 06/03/2014  . Pneumococcal Polysaccharide-23 04/14/2007, 09/22/2015  . Tdap 04/14/2007  . Zoster 12/06/2011    Review of Systems  Constitutional: Negative for activity change, appetite change, chills, diaphoresis, fatigue, fever and unexpected weight change.  HENT: Negative for congestion, dental problem, drooling, ear discharge, ear pain, facial swelling, hearing loss, mouth sores, nosebleeds, postnasal drip, rhinorrhea, sinus pressure, sneezing, sore throat, tinnitus, trouble swallowing and voice change.   Eyes: Negative for photophobia, pain, discharge, redness, itching and visual disturbance.  Respiratory: Negative for apnea, cough, choking, chest tightness, shortness of breath, wheezing and stridor.   Cardiovascular: Negative for chest pain, palpitations and leg swelling.  Gastrointestinal: Negative for abdominal pain, blood in stool, constipation, diarrhea, nausea and vomiting.  Endocrine: Negative for cold intolerance, heat intolerance, polydipsia,  polyphagia and polyuria.  Genitourinary: Negative for decreased urine volume, difficulty urinating, discharge, dysuria, enuresis, flank pain, frequency, genital sores, hematuria, penile pain, penile swelling, scrotal swelling, testicular pain and urgency.   Musculoskeletal: Negative for arthralgias, back pain, gait problem, joint swelling, myalgias, neck pain and neck stiffness.  Skin: Positive for rash. Negative for color change, pallor and wound.  Allergic/Immunologic: Negative for environmental allergies, food allergies and immunocompromised state.  Neurological: Negative for dizziness, tremors, seizures, syncope, facial asymmetry, speech difficulty, weakness, light-headedness, numbness and headaches.  Hematological: Negative for adenopathy. Does not bruise/bleed easily.  Psychiatric/Behavioral: Negative for agitation, behavioral problems, confusion, decreased concentration, dysphoric mood, hallucinations, self-injury, sleep disturbance and suicidal ideas. The patient is not nervous/anxious and is not hyperactive.     Past Medical History:  Diagnosis Date  . Allergy    seasonal  fall  . Cancer ([place])    Basal cell carcinoma; multiple  . Eczema   . Hyperlipidemia   . Hypertension    Past Surgical History:  Procedure Laterality Date  . CATARACT EXTRACTION, BILATERAL  06/29/2015  . MOHS SURGERY    . TONSILLECTOMY  1940 maybe   No Known Allergies Current Outpatient Prescriptions  Medication Sig Dispense Refill  . lisinopril (PRINIVIL,ZESTRIL) 5 MG tablet Take 1 tablet (5 mg total) by mouth daily. 90 tablet 3  . metoprolol succinate (TOPROL-XL) 50 MG 24 hr tablet Take 1 tablet (50 mg total) by mouth daily. 90 tablet 3  . pravastatin (PRAVACHOL) 40 MG tablet TAKE 1 TABLET ONCE DAILY. 90 tablet 3  . predniSONE (DELTASONE) 20 MG tablet Take and taper tablets as directed. 20 tablet 0  . triamcinolone cream (KENALOG) 0.1 % APPLY TWICE DAILY. 453.6 g 3   No current facility-administered medications for this visit.    Social History   Social History  . Marital status: Married    Spouse name: N/A  . Number of children: N/A  . Years of education: N/A   Occupational History  . retired    Social History Main Topics  . Smoking status:  Never Smoker  . Smokeless tobacco: Never Used  . Alcohol use No     Comment: occas  . Drug use: No  . Sexual activity: Not Currently   Other Topics Concern  . Not on file   Social History Narrative   Marital status: divorced; good friend with ex-wife; not dating      Children: 2 children; 4 grandchildren; no gg      Lives:  Alone in house; daughter six blocks away      Employment: college professor; retired in 1997; history; [place]:  Never      Alcohol: tequila loves but won't buy it.      Exercise:  As much as can; previous competitive biking.      ADLs: quit mowing grass in 2017; no assistant devices; cooks and cleans and pays bills.  No driving; HAS NEVER DRIVEN; has always ridden bike.  Daughter takes patient to grocery store and to appointments.        Advanced Directives:  No living; DNR; do not resuscitate.  HCPOA: [name] Cummings   Family History  Problem Relation Age of Onset  . Arthritis Mother   . Heart disease Mother 6  . Hypertension Father        Objective:    BP 122/70 (BP Location: Right Arm, Patient Position: Sitting, Cuff Size: Small)   Pulse 70   Temp 98  F (36.7 C) (Oral)   Resp 18   Ht 5\' 8"  (1.727 m)   Wt 148 lb (67.1 kg)   SpO2 97%   BMI 22.50 kg/m  Physical Exam  Constitutional: He is oriented to person, place, and time. He appears well-developed and well-nourished. No distress.  HENT:  Head: Normocephalic and atraumatic.  Right Ear: External ear normal.  Left Ear: External ear normal.  Nose: Nose normal.  Mouth/Throat: Oropharynx is clear and moist.  Eyes: Conjunctivae and EOM are normal. Pupils are equal, round, and reactive to light.  Neck: Normal range of motion. Neck supple. Carotid bruit is not present. No thyromegaly present.  Cardiovascular: Normal rate, regular rhythm, normal heart sounds and intact distal pulses.  Exam reveals no gallop and no friction rub.   No murmur heard. 1+ pitting edema, bilateral  lower extremities. Frequent PVCs.  Pulmonary/Chest: Effort normal. No respiratory distress. He has no wheezes. He has rales.  Rales in bases, which is chronic.   Abdominal: Soft. Bowel sounds are normal. He exhibits no distension and no mass. There is no tenderness. There is no rebound and no guarding.  Genitourinary:  Genitourinary Comments: No hernia.   Musculoskeletal: Normal range of motion.       Right shoulder: Normal.       Left shoulder: Normal.       Cervical back: Normal.  Ecchymosis along the left anterior shoulder but full ROM and strength in the shoulder.   Lymphadenopathy:    He has no cervical adenopathy.  Neurological: He is alert and oriented to person, place, and time. He has normal reflexes. No cranial nerve deficit. He exhibits normal muscle tone. Coordination normal.  Skin: Skin is warm and dry. No rash noted. He is not diaphoretic.  Psychiatric: He has a normal mood and affect. His behavior is normal. Judgment and thought content normal.  Nursing note and vitals reviewed.  Functional Status Survey: Is the patient deaf or have difficulty hearing?: No Does the patient have difficulty seeing, even when wearing glasses/contacts?: No Does the patient have difficulty concentrating, remembering, or making decisions?: No Does the patient have difficulty walking or climbing stairs?: No Does the patient have difficulty dressing or bathing?: No Does the patient have difficulty doing errands alone such as visiting a doctor's office or shopping?: No      Assessment & Plan:   1. Encounter for Medicare annual wellness exam   2. Essential hypertension   3. PVC (premature ventricular contraction)   4. Eczema   5. Basal cell carcinoma of face   6. Chronic renal insufficiency, stage 2 (mild)   7. Dyslipidemia   8. Osteoporosis/osteopenia increased risk   9. Tachycardia     Orders Placed This Encounter  Procedures  . CBC with Differential/Platelet  . Comprehensive  metabolic panel    Order Specific Question:   Has the patient fasted?    Answer:   Yes  . Lipid panel    Order Specific Question:   Has the patient fasted?    Answer:   Yes  . POCT urinalysis dipstick  . POCT Microscopic Urinalysis ([facility])  . EKG 12-Lead   Meds ordered this encounter  Medications  . predniSONE (DELTASONE) 20 MG tablet    Sig: Take and taper tablets as directed.    Dispense:  20 tablet    Refill:  0  . pravastatin (PRAVACHOL) 40 MG tablet    Sig: TAKE 1 TABLET ONCE DAILY.    Dispense:  90  tablet    Refill:  3  . metoprolol succinate (TOPROL-XL) 50 MG 24 hr tablet    Sig: Take 1 tablet (50 mg total) by mouth daily.    Dispense:  90 tablet    Refill:  3  . lisinopril (PRINIVIL,ZESTRIL) 5 MG tablet    Sig: Take 1 tablet (5 mg total) by mouth daily.    Dispense:  90 tablet    Refill:  3    Return in about 3 months (around 09/11/2016) for recheck.  I personally performed the services described in this documentation, which was scribed in my presence. The recorded information has been reviewed and considered.  Saphyre Cillo Elayne Guerin, M.D. [facility] [address] [place], [place]  [address] [phone] phone [phone] fax

## 2016-06-25 DIAGNOSIS — H2513 Age-related nuclear cataract, bilateral: Secondary | ICD-10-CM | POA: Diagnosis not present

## 2016-09-09 ENCOUNTER — Encounter: Payer: Self-pay | Admitting: Family Medicine

## 2016-09-11 ENCOUNTER — Encounter: Payer: Self-pay | Admitting: Family Medicine

## 2016-09-11 ENCOUNTER — Ambulatory Visit (INDEPENDENT_AMBULATORY_CARE_PROVIDER_SITE_OTHER): Payer: Medicare Other | Admitting: Family Medicine

## 2016-09-11 VITALS — BP 118/72 | HR 90 | Temp 97.7°F | Resp 18 | Ht 68.0 in | Wt 152.0 lb

## 2016-09-11 DIAGNOSIS — L308 Other specified dermatitis: Secondary | ICD-10-CM | POA: Diagnosis not present

## 2016-09-11 DIAGNOSIS — N182 Chronic kidney disease, stage 2 (mild): Secondary | ICD-10-CM | POA: Diagnosis not present

## 2016-09-11 DIAGNOSIS — I499 Cardiac arrhythmia, unspecified: Secondary | ICD-10-CM | POA: Diagnosis not present

## 2016-09-11 DIAGNOSIS — R600 Localized edema: Secondary | ICD-10-CM

## 2016-09-11 DIAGNOSIS — N2889 Other specified disorders of kidney and ureter: Secondary | ICD-10-CM

## 2016-09-11 DIAGNOSIS — I498 Other specified cardiac arrhythmias: Secondary | ICD-10-CM

## 2016-09-11 DIAGNOSIS — Z23 Encounter for immunization: Secondary | ICD-10-CM | POA: Diagnosis not present

## 2016-09-11 DIAGNOSIS — I10 Essential (primary) hypertension: Secondary | ICD-10-CM | POA: Diagnosis not present

## 2016-09-11 LAB — COMPREHENSIVE METABOLIC PANEL
ALBUMIN: 4.3 g/dL (ref 3.6–5.1)
ALK PHOS: 65 U/L (ref 40–115)
ALT: 15 U/L (ref 9–46)
AST: 22 U/L (ref 10–35)
BILIRUBIN TOTAL: 0.8 mg/dL (ref 0.2–1.2)
BUN: 17 mg/dL (ref 7–25)
CHLORIDE: 102 mmol/L (ref 98–110)
CO2: 31 mmol/L (ref 20–31)
CREATININE: 1.23 mg/dL — AB (ref 0.70–1.11)
Calcium: 9.8 mg/dL (ref 8.6–10.3)
Glucose, Bld: 86 mg/dL (ref 65–99)
Potassium: 4.3 mmol/L (ref 3.5–5.3)
SODIUM: 141 mmol/L (ref 135–146)
TOTAL PROTEIN: 7.2 g/dL (ref 6.1–8.1)

## 2016-09-11 LAB — CBC WITH DIFFERENTIAL/PLATELET
BASOS ABS: 84 {cells}/uL (ref 0–200)
Basophils Relative: 1 %
EOS PCT: 4 %
Eosinophils Absolute: 336 cells/uL (ref 15–500)
HCT: 45.3 % (ref 38.5–50.0)
HEMOGLOBIN: 15.1 g/dL (ref 13.2–17.1)
LYMPHS ABS: 1932 {cells}/uL (ref 850–3900)
Lymphocytes Relative: 23 %
MCH: 32.2 pg (ref 27.0–33.0)
MCHC: 33.3 g/dL (ref 32.0–36.0)
MCV: 96.6 fL (ref 80.0–100.0)
MPV: 10.2 fL (ref 7.5–12.5)
Monocytes Absolute: 840 cells/uL (ref 200–950)
Monocytes Relative: 10 %
NEUTROS ABS: 5208 {cells}/uL (ref 1500–7800)
Neutrophils Relative %: 62 %
Platelets: 267 10*3/uL (ref 140–400)
RBC: 4.69 MIL/uL (ref 4.20–5.80)
RDW: 12.6 % (ref 11.0–15.0)
WBC: 8.4 10*3/uL (ref 3.8–10.8)

## 2016-09-11 LAB — POCT URINALYSIS DIP (MANUAL ENTRY)
BILIRUBIN UA: NEGATIVE
BILIRUBIN UA: NEGATIVE
Blood, UA: NEGATIVE
Glucose, UA: NEGATIVE
LEUKOCYTES UA: NEGATIVE
Nitrite, UA: NEGATIVE
PROTEIN UA: NEGATIVE
Spec Grav, UA: 1.015
Urobilinogen, UA: 1
pH, UA: 5.5

## 2016-09-11 MED ORDER — FUROSEMIDE 20 MG PO TABS: 20.0000 mg | ORAL_TABLET | Freq: Every day | ORAL | 0 refills | Status: DC

## 2016-09-11 MED ORDER — PREDNISONE 20 MG PO TABS: ORAL_TABLET | ORAL | 0 refills | Status: DC

## 2016-09-11 NOTE — Progress Notes (Signed)
Subjective:    Patient ID: [name], male    DOB: [birth year], 82 y.o.   MRN: [id number]  09/11/2016  Follow-up (3 MONTH) and Flu Vaccine   HPI This 82 y.o. male presents for three month follow-up of hypertension, hypercholesterolemia, eczema. "I feel great".  Has noticed that legs are more swollen than baseline; not sure of onset.  Shrinking; previously 5'11".  Not checking blood pressure at home.  No chest pain or SOB; with working, must rest a little more with raking.  No orthopnea.  Will not ride bike any longer due to age and instability; not getting as much exercise as when younger. Has good support from daughter.  Eczema is biggest health issue for patient; requesting refill of Prednisone to have on hand.  Denies orthopnea or palpitations; no previous cardiac evaluation.     Wt Readings from Last 3 Encounters:  09/11/16 152 lb (68.9 kg)  06/11/16 148 lb (67.1 kg)  03/19/16 147 lb 3.2 oz (66.8 kg)   BP Readings from Last 3 Encounters:  09/11/16 118/72  06/11/16 122/70  03/19/16 (!) 148/90   Immunization History  Administered Date(s) Administered  . Influenza, Seasonal, Injecte, Preservative Fre 10/14/2012  . Influenza,inj,Quad PF,36+ Mos 12/07/2014, 08/11/2015, 09/11/2016  . Pneumococcal Conjugate-13 06/03/2014  . Pneumococcal Polysaccharide-23 04/14/2007, 09/22/2015  . Tdap 04/14/2007  . Zoster 12/06/2011      Review of Systems  Constitutional: Negative for activity change, appetite change, chills, diaphoresis, fatigue and fever.  Respiratory: Negative for cough and shortness of breath.   Cardiovascular: Positive for leg swelling. Negative for chest pain and palpitations.  Gastrointestinal: Negative for abdominal pain, diarrhea, nausea and vomiting.  Endocrine: Negative for cold intolerance, heat intolerance, polydipsia, polyphagia and polyuria.  Skin: Positive for rash. Negative for color change and wound.  Neurological: Negative for dizziness, tremors, seizures,  syncope, facial asymmetry, speech difficulty, weakness, light-headedness, numbness and headaches.  Psychiatric/Behavioral: Negative for dysphoric mood and sleep disturbance. The patient is not nervous/anxious.     Past Medical History:  Diagnosis Date  . Allergy    seasonal  fall  . Cancer ([place])    Basal cell carcinoma; multiple  . Eczema   . Hyperlipidemia   . Hypertension    Past Surgical History:  Procedure Laterality Date  . CATARACT EXTRACTION, BILATERAL  06/29/2015  . MOHS SURGERY    . TONSILLECTOMY  1940 maybe   No Known Allergies  Social History   Social History  . Marital status: Married    Spouse name: N/A  . Number of children: N/A  . Years of education: N/A   Occupational History  . retired    Social History Main Topics  . Smoking status: Never Smoker  . Smokeless tobacco: Never Used  . Alcohol use No     Comment: occas  . Drug use: No  . Sexual activity: Not Currently   Other Topics Concern  . Not on file   Social History Narrative   Marital status: divorced; good friend with ex-wife; not dating      Children: 2 children; 4 grandchildren; no gg      Lives:  Alone in house; daughter six blocks away      Employment: college professor; retired in 1997; history; [place]:  Never      Alcohol: tequila loves but won't buy it.      Exercise:  As much as can; previous competitive biking.      ADLs:  quit mowing grass in 2017; no assistant devices; cooks and cleans and pays bills.  No driving; HAS NEVER DRIVEN; has always ridden bike.  Daughter takes patient to grocery store and to appointments.        Advanced Directives:  No living; DNR; do not resuscitate.  HCPOA: [name] Cummings   Family History  Problem Relation Age of Onset  . Arthritis Mother   . Heart disease Mother 57  . Hypertension Father        Objective:    BP 118/72 (BP Location: Left Arm, Patient Position: Sitting, Cuff Size: Small)   Pulse 90   Temp 97.7 F (36.5  C) (Oral)   Resp 18   Ht _0  (1.727 m)   Wt 152 lb (68.9 kg)   SpO2 96%   BMI 23.11 kg/m  Physical Exam  Constitutional: He is oriented to person, place, and time. He appears well-developed and well-nourished. No distress.  HENT:  Head: Normocephalic and atraumatic.  Right Ear: External ear normal.  Left Ear: External ear normal.  Nose: Nose normal.  Mouth/Throat: Oropharynx is clear and moist.  Eyes: Conjunctivae and EOM are normal. Pupils are equal, round, and reactive to light.  Neck: Normal range of motion. Neck supple. Carotid bruit is not present. No thyromegaly present.  Cardiovascular: Normal rate, regular rhythm, normal heart sounds and intact distal pulses.  Exam reveals no gallop and no friction rub.   No murmur heard. Pulmonary/Chest: Effort normal. He has no wheezes. He has rales in the right lower field and the left lower field.  Abdominal: Soft. Bowel sounds are normal. He exhibits no distension and no mass. There is no tenderness. There is no rebound and no guarding.  Musculoskeletal: He exhibits edema.  Lymphadenopathy:    He has no cervical adenopathy.  Neurological: He is alert and oriented to person, place, and time. No cranial nerve deficit.  Skin: Skin is warm and dry. Rash noted. He is not diaphoretic.  Diffuse dry scaling rash along extremities.  No associated erythema.  Psychiatric: He has a normal mood and affect. His behavior is normal.  Nursing note and vitals reviewed.  Results for orders placed or performed in visit on 09/11/16  CBC with Differential/Platelet  Result Value Ref Range   WBC 8.4 3.8 - 10.8 K/uL   RBC 4.69 4.20 - 5.80 MIL/uL   Hemoglobin 15.1 13.2 - 17.1 g/dL   HCT 45.3 38.5 - 50.0 %   MCV 96.6 80.0 - 100.0 fL   MCH 32.2 27.0 - 33.0 pg   MCHC 33.3 32.0 - 36.0 g/dL   RDW 12.6 11.0 - 15.0 %   Platelets 267 140 - 400 K/uL   MPV 10.2 7.5 - 12.5 fL   Neutro Abs 5,208 1,500 - 7,800 cells/uL   Lymphs Abs 1,932 850 - 3,900 cells/uL    Monocytes Absolute 840 200 - 950 cells/uL   Eosinophils Absolute 336 15 - 500 cells/uL   Basophils Absolute 84 0 - 200 cells/uL   Neutrophils Relative % 62 %   Lymphocytes Relative 23 %   Monocytes Relative 10 %   Eosinophils Relative 4 %   Basophils Relative 1 %   Smear Review Criteria for review not met   Comprehensive metabolic panel  Result Value Ref Range   Sodium 141 135 - 146 mmol/L   Potassium 4.3 3.5 - 5.3 mmol/L   Chloride 102 98 - 110 mmol/L   CO2 31 20 - 31 mmol/L   Glucose,  Bld 86 65 - 99 mg/dL   BUN 17 7 - 25 mg/dL   Creat 1.23 (H) 0.70 - 1.11 mg/dL   Total Bilirubin 0.8 0.2 - 1.2 mg/dL   Alkaline Phosphatase 65 40 - 115 U/L   AST 22 10 - 35 U/L   ALT 15 9 - 46 U/L   Total Protein 7.2 6.1 - 8.1 g/dL   Albumin 4.3 3.6 - 5.1 g/dL   Calcium 9.8 8.6 - 10.3 mg/dL  POCT urinalysis dipstick  Result Value Ref Range   Color, UA yellow yellow   Clarity, UA clear clear   Glucose, UA negative negative   Bilirubin, UA negative negative   Ketones, POC UA negative negative   Spec Grav, UA 1.015    Blood, UA negative negative   pH, UA 5.5    Protein Ur, POC negative negative   Urobilinogen, UA 1.0    Nitrite, UA Negative Negative   Leukocytes, UA Negative Negative   EKG: bigeminy    Assessment & Plan:   1. Bigeminy   2. Chronic renal impairment, stage 2 (mild)   3. Essential hypertension   4. Lower leg edema   5. Need for prophylactic vaccination and inoculation against influenza   6. Other eczema    -with new onset leg swelling and bigeminy, recommend cardiology consultation; pt refuses cardiology consult at this time; asymptomatic; thus, desires treatment for edema with PCP; rx for Lasix 87m one tablet daily until swelling resolved; advised to HOLD Lisinopril while taking Lasix to avoid hypotension. -obtain labs. -refill of Prednisone provided.   Orders Placed This Encounter  Procedures  . Flu Vaccine QUAD 36+ mos IM  . CBC with Differential/Platelet  .  Comprehensive metabolic panel  . POCT urinalysis dipstick  . EKG 12-Lead   Meds ordered this encounter  Medications  . furosemide (LASIX) 20 MG tablet    Sig: Take 1 tablet (20 mg total) by mouth daily.    Dispense:  30 tablet    Refill:  0  . predniSONE (DELTASONE) 20 MG tablet    Sig: 670mdaily x 1 day then 5091maily x 1 day then 9m76mily x 1 day then 30mg9mly x 1 day then 20mg 73my x 1 day and then stop    Dispense:  20 tablet    Refill:  0    Return in about 2 weeks (around 09/25/2016) for recheck leg swelling.   Azana Kiesler MartinElayne Guerin [id number] [phone]

## 2016-09-11 NOTE — Patient Instructions (Addendum)
1. Take Furosemide one daily for two weeks; HOLD Lisinopril while taking Furosemide (diuretic). 2. Continue Metoprolol and Pravastatin daily as prescribed.    IF you received an x-ray today, you will receive an invoice from [facility]. Please contact [facility] at [phone] with questions or concerns regarding your invoice.   IF you received labwork today, you will receive an invoice from [employer]. Please contact [facility] at [phone] with questions or concerns regarding your invoice.   Our billing staff will not be able to assist you with questions regarding bills from these companies.  You will be contacted with the lab results as soon as they are available. The fastest way to get your results is to activate your My Chart account. Instructions are located on the last page of this paperwork. If you have not heard from [place] regarding the results in 2 weeks, please contact this office.

## 2016-09-25 ENCOUNTER — Encounter: Payer: Self-pay | Admitting: Family Medicine

## 2016-09-25 ENCOUNTER — Ambulatory Visit (INDEPENDENT_AMBULATORY_CARE_PROVIDER_SITE_OTHER): Payer: Medicare Other | Admitting: Family Medicine

## 2016-09-25 VITALS — BP 150/86 | HR 93 | Temp 97.9°F | Resp 16 | Ht 67.0 in | Wt 150.0 lb

## 2016-09-25 DIAGNOSIS — Z23 Encounter for immunization: Secondary | ICD-10-CM

## 2016-09-25 DIAGNOSIS — I499 Cardiac arrhythmia, unspecified: Secondary | ICD-10-CM | POA: Diagnosis not present

## 2016-09-25 DIAGNOSIS — I10 Essential (primary) hypertension: Secondary | ICD-10-CM

## 2016-09-25 DIAGNOSIS — I498 Other specified cardiac arrhythmias: Secondary | ICD-10-CM

## 2016-09-25 DIAGNOSIS — R600 Localized edema: Secondary | ICD-10-CM

## 2016-09-25 DIAGNOSIS — S51802A Unspecified open wound of left forearm, initial encounter: Secondary | ICD-10-CM

## 2016-09-25 DIAGNOSIS — N182 Chronic kidney disease, stage 2 (mild): Secondary | ICD-10-CM

## 2016-09-25 MED ORDER — CEPHALEXIN 500 MG PO CAPS: 500.0000 mg | ORAL_CAPSULE | Freq: Three times a day (TID) | ORAL | 0 refills | Status: DC

## 2016-09-25 NOTE — Patient Instructions (Signed)
     IF you received an x-ray today, you will receive an invoice from [facility]. Please contact [facility] at [phone] with questions or concerns regarding your invoice.   IF you received labwork today, you will receive an invoice from [facility]. Please contact [facility] at [phone] with questions or concerns regarding your invoice.   Our billing staff will not be able to assist you with questions regarding bills from these companies.  You will be contacted with the lab results as soon as they are available. The fastest way to get your results is to activate your My Chart account. Instructions are located on the last page of this paperwork. If you have not heard from us regarding the results in 2 weeks, please contact this office.      

## 2016-09-25 NOTE — Progress Notes (Signed)
Subjective:    Patient ID: [name], male    DOB: [birth year], 82 y.o.   MRN: [id number]  09/25/2016  Follow-up (swelling both legs at the ankles, per pt "is better")   HPI This 82 y.o. male presents for two week follow-up of leg swelling, bigeminy. Prescribed Lasix 24m one tablet daily at visit two weeks ago; pt refused cardiology consultation.  Swelling is improved from last visit; has been holding Lisinopril since last visit.  Started feeling better in legs almost immediately.  No chest pain; no shortness of breath.    Cleaning up brush in the yard this week; a limp from a tree cut patient on L arm.  Has an open wound; unable to clean wound thoroughly; did apply iodine to wound; would like bandage; excessive bleeding initially.    Wt Readings from Last 3 Encounters:  09/25/16 150 lb (68 kg)  09/11/16 152 lb (68.9 kg)  06/11/16 148 lb (67.1 kg)   BP Readings from Last 3 Encounters:  09/25/16 (!) 150/86  09/11/16 118/72  06/11/16 122/70   Immunization History  Administered Date(s) Administered  . Influenza, Seasonal, Injecte, Preservative Fre 10/14/2012  . Influenza,inj,Quad PF,36+ Mos 12/07/2014, 08/11/2015, 09/11/2016  . Pneumococcal Conjugate-13 06/03/2014  . Pneumococcal Polysaccharide-23 04/14/2007, 09/22/2015  . Tdap 04/14/2007  . Zoster 12/06/2011    Review of Systems  Constitutional: Negative for activity change, appetite change, chills, diaphoresis, fatigue and fever.  Respiratory: Negative for cough and shortness of breath.   Cardiovascular: Positive for leg swelling. Negative for chest pain and palpitations.  Gastrointestinal: Negative for abdominal pain, diarrhea, nausea and vomiting.  Endocrine: Negative for cold intolerance, heat intolerance, polydipsia, polyphagia and polyuria.  Skin: Positive for wound. Negative for color change and rash.  Neurological: Negative for dizziness, tremors, seizures, syncope, facial asymmetry, speech difficulty, weakness,  light-headedness, numbness and headaches.  Psychiatric/Behavioral: Negative for dysphoric mood and sleep disturbance. The patient is not nervous/anxious.     Past Medical History:  Diagnosis Date  . Allergy    seasonal  fall  . Cancer ([place]    Basal cell carcinoma; multiple  . Eczema   . Hyperlipidemia   . Hypertension    Past Surgical History:  Procedure Laterality Date  . CATARACT EXTRACTION, BILATERAL  06/29/2015  . MOHS SURGERY    . TONSILLECTOMY  1940 maybe   No Known Allergies  Social History   Social History  . Marital status: Married    Spouse name: N/A  . Number of children: N/A  . Years of education: N/A   Occupational History  . retired    Social History Main Topics  . Smoking status: Never Smoker  . Smokeless tobacco: Never Used  . Alcohol use No     Comment: occas  . Drug use: No  . Sexual activity: Not Currently   Other Topics Concern  . 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      Exercise:  As much as can; previous competitive biking.      ADLs: quit mowing grass in 2017; no assistant devices; cooks  and cleans and pays bills.  No driving; HAS NEVER DRIVEN; has always ridden bike.  Daughter takes patient to grocery store and to appointments.        Advanced Directives:  No living; DNR; do not resuscitate.  HCPOA: [name] Cummings   Family History  Problem Relation Age of Onset  . Arthritis Mother   . Heart disease Mother 64  . Hypertension Father        Objective:    BP (!) 150/86   Pulse 93   Temp 97.9 F (36.6 C) (Oral)   Resp 16   Ht '5\' 7"'  (1.702 m)   Wt 150 lb (68 kg)   SpO2 97%   BMI 23.49 kg/m  Physical Exam    Constitutional: He is oriented to person, place, and time. He appears well-developed and well-nourished. No distress.  HENT:  Head: Normocephalic and atraumatic.  Right Ear: External ear normal.  Left Ear: External ear normal.  Nose: Nose normal.  Mouth/Throat: Oropharynx is clear and moist.  Eyes: Conjunctivae and EOM are normal. Pupils are equal, round, and reactive to light.  Neck: Normal range of motion. Neck supple. Carotid bruit is not present. No thyromegaly present.  Cardiovascular: Normal rate, regular rhythm, normal heart sounds and intact distal pulses.  Exam reveals no gallop and no friction rub.   No murmur heard. 1+ pitting edema to distal ankles B only.  Pulmonary/Chest: [place] normal and breath sounds normal. He has no wheezes. He has no rales.  Abdominal: Soft. Bowel sounds are normal. He exhibits no distension and no mass. There is no tenderness. There is no rebound and no guarding.  Musculoskeletal: He exhibits edema.  Lymphadenopathy:    He has no cervical adenopathy.  Neurological: He is alert and oriented to person, place, and time. No cranial nerve deficit.  Skin: Skin is warm and dry. No rash noted. He is not diaphoretic.  Open wound/laceration 1 cm x 1.5cm of L arm near medial epicondyle; poor approximation; no surrounding erythema or induration.  Psychiatric: He has a normal mood and affect. His behavior is normal.  Nursing note and vitals reviewed.  Results for orders placed or performed in visit on 09/11/16  CBC with Differential/Platelet  Result Value Ref Range   WBC 8.4 3.8 - 10.8 K/uL   RBC 4.69 4.20 - 5.80 MIL/uL   Hemoglobin 15.1 13.2 - 17.1 g/dL   HCT 45.3 38.5 - 50.0 %   MCV 96.6 80.0 - 100.0 fL   MCH 32.2 27.0 - 33.0 pg   MCHC 33.3 32.0 - 36.0 g/dL   RDW 12.6 11.0 - 15.0 %   Platelets 267 140 - 400 K/uL   MPV 10.2 7.5 - 12.5 fL   Neutro Abs 5,208 1,500 - 7,800 cells/uL   Lymphs Abs 1,932 850 - 3,900 cells/uL   Monocytes Absolute 840 200 -  950 cells/uL   Eosinophils Absolute 336 15 - 500 cells/uL   Basophils Absolute 84 0 - 200 cells/uL   Neutrophils Relative % 62 %   Lymphocytes Relative 23 %   Monocytes Relative 10 %   Eosinophils Relative 4 %   Basophils Relative 1 %   Smear Review Criteria for review not met   Comprehensive metabolic panel  Result Value Ref Range   Sodium 141 135 - 146 mmol/L   Potassium 4.3 3.5 - 5.3 mmol/L   Chloride 102 98 - 110 mmol/L   CO2 31 20 - 31 mmol/L   Glucose, Bld 86 65 -  99 mg/dL   BUN 17 7 - 25 mg/dL   Creat 1.23 (H) 0.70 - 1.11 mg/dL   Total Bilirubin 0.8 0.2 - 1.2 mg/dL   Alkaline Phosphatase 65 40 - 115 U/L   AST 22 10 - 35 U/L   ALT 15 9 - 46 U/L   Total Protein 7.2 6.1 - 8.1 g/dL   Albumin 4.3 3.6 - 5.1 g/dL   Calcium 9.8 8.6 - 10.3 mg/dL  POCT urinalysis dipstick  Result Value Ref Range   Color, UA yellow yellow   Clarity, UA clear clear   Glucose, UA negative negative   Bilirubin, UA negative negative   Ketones, POC UA negative negative   Spec Grav, UA 1.015    Blood, UA negative negative   pH, UA 5.5    Protein Ur, POC negative negative   Urobilinogen, UA 1.0    Nitrite, UA Negative Negative   Leukocytes, UA Negative Negative       Assessment & Plan:   1. Essential hypertension   2. Chronic renal impairment, stage 2 (mild)   3. Bigeminy   4. Leg edema   5. Open wound of left forearm, initial encounter    -improved leg edema with Lasix; continue two more weeks of Lasix therapy; continue to HOLD Lisinopril for two weeks and then restart.  Weight down two pounds from last visit. Repeat labs. -new wound L arm; s/p bandage and cleansing in office; s/p Tetanus update.  Rx for Keflex due to age and large size of wound.   Orders Placed This Encounter  Procedures  . Td vaccine greater than or equal to 7yo preservative free IM  . Comprehensive metabolic panel  . CBC with Differential/Platelet   Meds ordered this encounter  Medications  . cephALEXin (KEFLEX)  500 MG capsule    Sig: Take 1 capsule (500 mg total) by mouth 3 (three) times daily.    Dispense:  21 capsule    Refill:  0    Return in about 4 months (around 01/23/2017) for recheck.   Neven Fina Elayne Guerin, M.D. [facility] [address] [place], [place]  [address] [phone] phone [phone] fax

## 2016-09-26 ENCOUNTER — Telehealth: Payer: Self-pay

## 2016-09-26 LAB — CBC WITH DIFFERENTIAL/PLATELET
BASOS ABS: 97 {cells}/uL (ref 0–200)
Basophils Relative: 1 %
Eosinophils Absolute: 582 cells/uL — ABNORMAL HIGH (ref 15–500)
Eosinophils Relative: 6 %
HEMATOCRIT: 44.4 % (ref 38.5–50.0)
HEMOGLOBIN: 14.4 g/dL (ref 13.2–17.1)
LYMPHS ABS: 1552 {cells}/uL (ref 850–3900)
Lymphocytes Relative: 16 %
MCH: 31.6 pg (ref 27.0–33.0)
MCHC: 32.4 g/dL (ref 32.0–36.0)
MCV: 97.4 fL (ref 80.0–100.0)
MONO ABS: 873 {cells}/uL (ref 200–950)
MPV: 10.7 fL (ref 7.5–12.5)
Monocytes Relative: 9 %
NEUTROS PCT: 68 %
Neutro Abs: 6596 cells/uL (ref 1500–7800)
Platelets: 235 10*3/uL (ref 140–400)
RBC: 4.56 MIL/uL (ref 4.20–5.80)
RDW: 12.9 % (ref 11.0–15.0)
WBC: 9.7 10*3/uL (ref 3.8–10.8)

## 2016-09-26 LAB — COMPREHENSIVE METABOLIC PANEL
ALT: 13 U/L (ref 9–46)
AST: 23 U/L (ref 10–35)
Albumin: 3.9 g/dL (ref 3.6–5.1)
Alkaline Phosphatase: 77 U/L (ref 40–115)
BUN: 18 mg/dL (ref 7–25)
CHLORIDE: 101 mmol/L (ref 98–110)
CO2: 30 mmol/L (ref 20–31)
Calcium: 9.1 mg/dL (ref 8.6–10.3)
Creat: 1.18 mg/dL — ABNORMAL HIGH (ref 0.70–1.11)
GLUCOSE: 86 mg/dL (ref 65–99)
POTASSIUM: 3.9 mmol/L (ref 3.5–5.3)
Sodium: 141 mmol/L (ref 135–146)
Total Bilirubin: 0.5 mg/dL (ref 0.2–1.2)
Total Protein: 6.5 g/dL (ref 6.1–8.1)

## 2016-09-26 NOTE — Telephone Encounter (Signed)
Pharmacy called to verify patient's Toprol-XL prescription.  She had a 25mg  tablet and a 50mg  tablet on file.  Pharmacist states that she is going to fill the most recent prescription (50mg  tablet) unless otherwise advised not to.  Please advise  Pharmacy [phone]

## 2016-09-26 NOTE — Telephone Encounter (Signed)
Last 25mg  dose I see in 07/2015 Current dose listed 50mg .

## 2016-09-27 NOTE — Telephone Encounter (Signed)
Correct.  Metoprolol ER 50mg  one daily.

## 2016-11-17 ENCOUNTER — Other Ambulatory Visit: Payer: Self-pay | Admitting: Family Medicine

## 2016-11-19 ENCOUNTER — Telehealth: Payer: Self-pay

## 2016-11-19 NOTE — Telephone Encounter (Signed)
Pt is calling requesting refill on prednisone    Please advise [phone]

## 2016-11-20 ENCOUNTER — Other Ambulatory Visit: Payer: Self-pay | Admitting: Family Medicine

## 2016-11-20 MED ORDER — PREDNISONE 20 MG PO TABS: ORAL_TABLET | ORAL | 0 refills | Status: DC

## 2016-11-20 NOTE — Telephone Encounter (Signed)
Can we refill? 

## 2016-11-20 NOTE — Telephone Encounter (Signed)
Refill for prednisone approved.

## 2017-01-17 ENCOUNTER — Encounter: Payer: Self-pay | Admitting: Family Medicine

## 2017-01-29 ENCOUNTER — Telehealth: Payer: Self-pay | Admitting: *Deleted

## 2017-01-29 ENCOUNTER — Encounter: Payer: Self-pay | Admitting: Family Medicine

## 2017-01-29 ENCOUNTER — Ambulatory Visit (INDEPENDENT_AMBULATORY_CARE_PROVIDER_SITE_OTHER): Payer: Medicare Other | Admitting: Family Medicine

## 2017-01-29 VITALS — BP 157/87 | HR 68 | Temp 98.2°F | Resp 16 | Ht 67.5 in | Wt 148.0 lb

## 2017-01-29 DIAGNOSIS — I10 Essential (primary) hypertension: Secondary | ICD-10-CM

## 2017-01-29 DIAGNOSIS — J4521 Mild intermittent asthma with (acute) exacerbation: Secondary | ICD-10-CM

## 2017-01-29 DIAGNOSIS — J4520 Mild intermittent asthma, uncomplicated: Secondary | ICD-10-CM | POA: Insufficient documentation

## 2017-01-29 DIAGNOSIS — R000 Tachycardia, unspecified: Secondary | ICD-10-CM

## 2017-01-29 DIAGNOSIS — E785 Hyperlipidemia, unspecified: Secondary | ICD-10-CM

## 2017-01-29 DIAGNOSIS — J9801 Acute bronchospasm: Secondary | ICD-10-CM

## 2017-01-29 DIAGNOSIS — I493 Ventricular premature depolarization: Secondary | ICD-10-CM | POA: Diagnosis not present

## 2017-01-29 DIAGNOSIS — L308 Other specified dermatitis: Secondary | ICD-10-CM

## 2017-01-29 DIAGNOSIS — N182 Chronic kidney disease, stage 2 (mild): Secondary | ICD-10-CM | POA: Diagnosis not present

## 2017-01-29 MED ORDER — ALBUTEROL SULFATE HFA 108 (90 BASE) MCG/ACT IN AERS: 2.0000 | INHALATION_SPRAY | Freq: Four times a day (QID) | RESPIRATORY_TRACT | 3 refills | Status: DC | PRN

## 2017-01-29 MED ORDER — PREDNISONE 20 MG PO TABS: ORAL_TABLET | ORAL | 0 refills | Status: DC

## 2017-01-29 MED ORDER — LEVALBUTEROL HCL 0.63 MG/3ML IN NEBU
0.6300 mg | INHALATION_SOLUTION | Freq: Once | RESPIRATORY_TRACT | Status: AC
  Administered 2017-01-29: 0.63 mg via RESPIRATORY_TRACT

## 2017-01-29 MED ORDER — FUROSEMIDE 20 MG PO TABS: 20.0000 mg | ORAL_TABLET | Freq: Every day | ORAL | 1 refills | Status: DC

## 2017-01-29 MED ORDER — TRIAMCINOLONE 0.1 % CREAM:EUCERIN CREAM 1:1: 1.0000 "application " | TOPICAL_CREAM | Freq: Two times a day (BID) | CUTANEOUS | 1 refills | Status: DC

## 2017-01-29 NOTE — Progress Notes (Signed)
Subjective:    Patient ID: [name], male    DOB: September 06, 1932, 83 y.o.   MRN: [id number]  01/29/2017  Follow-up; Medication Refill (PREDNISONE); and Hypertension   HPI This 83 y.o. male presents for four month follow-up for hypertension, tachycardia, eczema, hypercholesterolemia, and leg swelling.  Eczema has flared.  Needs refill of Prednisone.  Legs have swelled again but occurs daily. No issues.   Taking Metoprolol, Lisinopril,   With eczema, will take one or two tablets for eczema.  Scratches all night long.  I am very happy and content.   Hopes to live until daughter's brithday in August 2018.  Great mood.  Feels good.  Uses Triamcinolone as needed; applies every morning and some in morning. No longer.   Fell in December and hit leg.  Used walker for one week and then used a cane. Turned too quickly.  Must be careful. Might have tripped.  Daughter lives close.   Immunization History  Administered Date(s) Administered  . Influenza, Seasonal, Injecte, Preservative Fre 10/14/2012  . Influenza,inj,Quad PF,36+ Mos 12/07/2014, 08/11/2015, 09/11/2016  . Pneumococcal Conjugate-13 06/03/2014  . Pneumococcal Polysaccharide-23 04/14/2007, 09/22/2015  . Tdap 04/14/2007, 09/25/2016  . Zoster 12/06/2011   BP Readings from Last 3 Encounters:  01/29/17 (!) 157/87  09/25/16 (!) 150/86  09/11/16 118/72   Wt Readings from Last 3 Encounters:  01/29/17 148 lb (67.1 kg)  09/25/16 150 lb (68 kg)  09/11/16 152 lb (68.9 kg)     Review of Systems  Constitutional: Negative for activity change, appetite change, chills, diaphoresis, fatigue and fever.  Respiratory: Negative for cough and shortness of breath.   Cardiovascular: Negative for chest pain, palpitations and leg swelling.  Gastrointestinal: Negative for abdominal pain, diarrhea, nausea and vomiting.  Endocrine: Negative for cold intolerance, heat intolerance, polydipsia, polyphagia and polyuria.  Skin: Positive for rash. Negative for  color change and wound.  Neurological: Negative for dizziness, tremors, seizures, syncope, facial asymmetry, speech difficulty, weakness, light-headedness, numbness and headaches.  Psychiatric/Behavioral: Negative for dysphoric mood and sleep disturbance. The patient is not nervous/anxious.     Past Medical History:  Diagnosis Date  . Allergy    seasonal  fall  . Cancer ([place])    Basal cell carcinoma; multiple  . Eczema   . Hyperlipidemia   . Hypertension    Past Surgical History:  Procedure Laterality Date  . CATARACT EXTRACTION, BILATERAL  06/29/2015  . MOHS SURGERY    . TONSILLECTOMY  1940 maybe   No Known Allergies  Social History   Social History  . Marital status: Married    Spouse name: N/A  . Number of children: N/A  . Years of education: N/A   Occupational History  . retired    Social History Main Topics  . Smoking status: Never Smoker  . Smokeless tobacco: Never Used  . Alcohol use No     Comment: occas  . Drug use: No  . Sexual activity: Not Currently   Other Topics Concern  . Not on file   Social History Narrative   Marital status: divorced; good friend with ex-wife; not dating      Children: 2 children; 4 grandchildren; no gg      Lives:  Alone in house; daughter six blocks away      Employment: college professor; retired in 1997; history; [place]:  Never      Alcohol: tequila loves but won't buy it.  Exercise:  As much as can; previous competitive biking.      ADLs: quit mowing grass in 2017; no assistant devices; cooks and cleans and pays bills.  No driving; HAS NEVER DRIVEN; has always ridden bike.  Daughter takes patient to grocery store and to appointments.        Advanced Directives:  No living; DNR; do not resuscitate.  HCPOA: [name] Cummings   Family History  Problem Relation Age of Onset  . Arthritis Mother   . Heart disease Mother 63  . Hypertension Father        Objective:    BP (!) 157/87 (BP Location:  Right Arm, Patient Position: Sitting, Cuff Size: Normal)   Pulse 68   Temp 98.2 F (36.8 C) (Oral)   Resp 16   Ht 5' 7.5" (1.715 m)   Wt 148 lb (67.1 kg)   SpO2 96%   BMI 22.84 kg/m  Physical Exam  Constitutional: He is oriented to person, place, and time. He appears well-developed and well-nourished. No distress.  HENT:  Head: Normocephalic and atraumatic.  Right Ear: External ear normal.  Left Ear: External ear normal.  Nose: Nose normal.  Mouth/Throat: Oropharynx is clear and moist.  Eyes: Conjunctivae and EOM are normal. Pupils are equal, round, and reactive to light.  Neck: Normal range of motion. Neck supple. Carotid bruit is not present. No thyromegaly present.  Cardiovascular: Normal rate, regular rhythm, normal heart sounds and intact distal pulses.  Exam reveals no gallop and no friction rub.   No murmur heard. Pulmonary/Chest: [place] normal and breath sounds normal. He has no wheezes. He has no rales.  Abdominal: Soft. Bowel sounds are normal. He exhibits no distension and no mass. There is no tenderness. There is no rebound and no guarding.  Lymphadenopathy:    He has no cervical adenopathy.  Neurological: He is alert and oriented to person, place, and time. No cranial nerve deficit.  Skin: Skin is warm and dry. No rash noted. He is not diaphoretic.  Diffuse scaling dry rash along legs, B arms.  Psychiatric: He has a normal mood and affect. His behavior is normal.  Nursing note and vitals reviewed.  Fall Risk  01/29/2017 01/29/2017 09/25/2016 09/11/2016 06/11/2016  Falls in the past year? Yes No Yes Yes No  Number falls in past yr: 1 - - 1 -  Injury with Fall? Yes - - No -   Depression screen [facility] 2/9 01/29/2017 01/29/2017 09/25/2016 09/11/2016 06/11/2016  Decreased Interest 0 0 0 0 0  Down, Depressed, Hopeless 0 0 0 0 0  PHQ - 2 Score 0 0 0 0 0        Assessment & Plan:   1. Essential hypertension   2. PVC (premature ventricular contraction)   3. Chronic renal  impairment, stage 2 (mild)   4. Dyslipidemia   5. Tachycardia   6. Bronchospasm   7. Mild intermittent asthma with acute exacerbation   8. Other eczema    -acute asthma exacerbation; s/p Xopenex in office; rx for Prednisone provided.  Rx for Albuterol [facility] provided to use PRN. -obtain labs; continue current medications. -rx for Triamcinolone:eucerin cream provided for eczema. -blood pressure and pulse stable; lower extremity edema persistent; restart daily Lasix therapy; pt refuses cardiology consultation to rule out new onset CHF.    Orders Placed This Encounter  Procedures  . CBC with Differential/Platelet  . Comprehensive metabolic panel    Order Specific Question:   Has the patient fasted?  Answer:   Yes  . Lipid panel    Order Specific Question:   Has the patient fasted?    Answer:   Yes   Meds ordered this encounter  Medications  . Triamcinolone Acetonide (TRIAMCINOLONE 0.1 % CREAM : EUCERIN) CREA    Sig: Apply 1 application topically 2 (two) times daily.    Dispense:  1 each    Refill:  1  . levalbuterol (XOPENEX) nebulizer solution 0.63 mg  . furosemide (LASIX) 20 MG tablet    Sig: Take 1 tablet (20 mg total) by mouth daily.    Dispense:  90 tablet    Refill:  1  . predniSONE (DELTASONE) 20 MG tablet    Sig: 60mg  daily x 1 day then 50mg  daily x 1 day then 40mg  daily x 1 day then 30mg  daily x 1 day then 20mg  daily x 1 day and then stop    Dispense:  20 tablet    Refill:  0  . albuterol (PROVENTIL HFA;VENTOLIN HFA) 108 (90 Base) MCG/ACT inhaler    Sig: Inhale 2 puffs into the lungs every 6 (six) hours as needed for wheezing or shortness of breath (cough, shortness of breath or wheezing.).    Dispense:  1 Inhaler    Refill:  3    Return in about 4 months (around 05/31/2017) for complete physical examiniation.   Kristi Elayne Guerin, M.D. [facility] [address] [place], [place]  [address] [phone] phone [phone] fax

## 2017-01-29 NOTE — Telephone Encounter (Signed)
Faxed Rx to patient's pharmacy, confirmation page received.

## 2017-01-29 NOTE — Patient Instructions (Signed)
     IF you received an x-ray today, you will receive an invoice from [facility]. Please contact [facility] at [phone] with questions or concerns regarding your invoice.   IF you received labwork today, you will receive an invoice from [facility]. Please contact [facility] at [phone] with questions or concerns regarding your invoice.   Our billing staff will not be able to assist you with questions regarding bills from these companies.  You will be contacted with the lab results as soon as they are available. The fastest way to get your results is to activate your My Chart account. Instructions are located on the last page of this paperwork. If you have not heard from us regarding the results in 2 weeks, please contact this office.     

## 2017-01-30 LAB — COMPREHENSIVE METABOLIC PANEL
ALT: 12 IU/L (ref 0–44)
AST: 21 IU/L (ref 0–40)
Albumin/Globulin Ratio: 1.6 (ref 1.2–2.2)
Albumin: 4.3 g/dL (ref 3.5–4.7)
Alkaline Phosphatase: 91 IU/L (ref 39–117)
BUN/Creatinine Ratio: 12 (ref 10–24)
BUN: 16 mg/dL (ref 8–27)
Bilirubin Total: 0.8 mg/dL (ref 0.0–1.2)
CALCIUM: 9.6 mg/dL (ref 8.6–10.2)
CO2: 27 mmol/L (ref 18–29)
CREATININE: 1.29 mg/dL — AB (ref 0.76–1.27)
Chloride: 99 mmol/L (ref 96–106)
GFR, EST AFRICAN AMERICAN: 59 mL/min/{1.73_m2} — AB (ref >59)
GFR, EST NON AFRICAN AMERICAN: 51 mL/min/{1.73_m2} — AB (ref >59)
GLOBULIN, TOTAL: 2.7 g/dL (ref 1.5–4.5)
Glucose: 87 mg/dL (ref 65–99)
Potassium: 4.7 mmol/L (ref 3.5–5.2)
Sodium: 141 mmol/L (ref 134–144)
TOTAL PROTEIN: 7 g/dL (ref 6.0–8.5)

## 2017-01-30 LAB — LIPID PANEL
CHOL/HDL RATIO: 2.8 ratio (ref 0.0–5.0)
Cholesterol, Total: 181 mg/dL (ref 100–199)
HDL: 64 mg/dL (ref >39)
LDL CALC: 89 mg/dL (ref 0–99)
TRIGLYCERIDES: 139 mg/dL (ref 0–149)
VLDL Cholesterol Cal: 28 mg/dL (ref 5–40)

## 2017-01-30 LAB — CBC WITH DIFFERENTIAL/PLATELET
BASOS: 1 %
Basophils Absolute: 0 10*3/uL (ref 0.0–0.2)
EOS (ABSOLUTE): 0.9 10*3/uL — ABNORMAL HIGH (ref 0.0–0.4)
EOS: 12 %
HEMATOCRIT: 46 % (ref 37.5–51.0)
Hemoglobin: 15.3 g/dL (ref 13.0–17.7)
IMMATURE GRANS (ABS): 0 10*3/uL (ref 0.0–0.1)
IMMATURE GRANULOCYTES: 0 %
LYMPHS: 21 %
Lymphocytes Absolute: 1.5 10*3/uL (ref 0.7–3.1)
MCH: 31.7 pg (ref 26.6–33.0)
MCHC: 33.3 g/dL (ref 31.5–35.7)
MCV: 95 fL (ref 79–97)
MONOS ABS: 0.6 10*3/uL (ref 0.1–0.9)
Monocytes: 8 %
NEUTROS PCT: 58 %
Neutrophils Absolute: 4.1 10*3/uL (ref 1.4–7.0)
PLATELETS: 209 10*3/uL (ref 150–379)
RBC: 4.83 x10E6/uL (ref 4.14–5.80)
RDW: 13.3 % (ref 12.3–15.4)
WBC: 7.1 10*3/uL (ref 3.4–10.8)

## 2017-02-02 ENCOUNTER — Encounter: Payer: Self-pay | Admitting: Family Medicine

## 2017-02-13 ENCOUNTER — Other Ambulatory Visit: Payer: Self-pay | Admitting: Family Medicine

## 2017-02-24 ENCOUNTER — Encounter: Payer: Self-pay | Admitting: Family Medicine

## 2017-03-29 ENCOUNTER — Encounter (HOSPITAL_COMMUNITY): Payer: Self-pay | Admitting: *Deleted

## 2017-03-29 ENCOUNTER — Inpatient Hospital Stay (HOSPITAL_COMMUNITY)
Admission: EM | Admit: 2017-03-29 | Discharge: 2017-04-01 | DRG: 481 | Disposition: A | Discharge status: Rehabilitation Facility | Payer: Medicare Other | Attending: Internal Medicine | Admitting: Internal Medicine

## 2017-03-29 ENCOUNTER — Emergency Department (HOSPITAL_COMMUNITY): Payer: Medicare Other

## 2017-03-29 DIAGNOSIS — S72142S Displaced intertrochanteric fracture of left femur, sequela: Secondary | ICD-10-CM | POA: Diagnosis not present

## 2017-03-29 DIAGNOSIS — D72829 Elevated white blood cell count, unspecified: Secondary | ICD-10-CM | POA: Diagnosis not present

## 2017-03-29 DIAGNOSIS — N400 Enlarged prostate without lower urinary tract symptoms: Secondary | ICD-10-CM | POA: Diagnosis not present

## 2017-03-29 DIAGNOSIS — M25572 Pain in left ankle and joints of left foot: Secondary | ICD-10-CM | POA: Diagnosis present

## 2017-03-29 DIAGNOSIS — S72402A Unspecified fracture of lower end of left femur, initial encounter for closed fracture: Secondary | ICD-10-CM | POA: Diagnosis not present

## 2017-03-29 DIAGNOSIS — Y92003 Bedroom of unspecified non-institutional (private) residence as the place of occurrence of the external cause: Secondary | ICD-10-CM | POA: Diagnosis not present

## 2017-03-29 DIAGNOSIS — D62 Acute posthemorrhagic anemia: Secondary | ICD-10-CM | POA: Diagnosis not present

## 2017-03-29 DIAGNOSIS — S72142A Displaced intertrochanteric fracture of left femur, initial encounter for closed fracture: Principal | ICD-10-CM | POA: Diagnosis present

## 2017-03-29 DIAGNOSIS — T148XXA Other injury of unspecified body region, initial encounter: Secondary | ICD-10-CM | POA: Diagnosis not present

## 2017-03-29 DIAGNOSIS — M25562 Pain in left knee: Secondary | ICD-10-CM | POA: Diagnosis present

## 2017-03-29 DIAGNOSIS — Z66 Do not resuscitate: Secondary | ICD-10-CM | POA: Diagnosis present

## 2017-03-29 DIAGNOSIS — M810 Age-related osteoporosis without current pathological fracture: Secondary | ICD-10-CM | POA: Diagnosis present

## 2017-03-29 DIAGNOSIS — S72012A Unspecified intracapsular fracture of left femur, initial encounter for closed fracture: Secondary | ICD-10-CM | POA: Diagnosis not present

## 2017-03-29 DIAGNOSIS — M7989 Other specified soft tissue disorders: Secondary | ICD-10-CM | POA: Diagnosis not present

## 2017-03-29 DIAGNOSIS — J45909 Unspecified asthma, uncomplicated: Secondary | ICD-10-CM | POA: Diagnosis present

## 2017-03-29 DIAGNOSIS — S72002A Fracture of unspecified part of neck of left femur, initial encounter for closed fracture: Secondary | ICD-10-CM | POA: Diagnosis present

## 2017-03-29 DIAGNOSIS — S72002D Fracture of unspecified part of neck of left femur, subsequent encounter for closed fracture with routine healing: Secondary | ICD-10-CM | POA: Diagnosis not present

## 2017-03-29 DIAGNOSIS — N182 Chronic kidney disease, stage 2 (mild): Secondary | ICD-10-CM | POA: Diagnosis present

## 2017-03-29 DIAGNOSIS — W19XXXD Unspecified fall, subsequent encounter: Secondary | ICD-10-CM | POA: Diagnosis not present

## 2017-03-29 DIAGNOSIS — W19XXXA Unspecified fall, initial encounter: Secondary | ICD-10-CM | POA: Diagnosis not present

## 2017-03-29 DIAGNOSIS — S99912A Unspecified injury of left ankle, initial encounter: Secondary | ICD-10-CM | POA: Diagnosis not present

## 2017-03-29 DIAGNOSIS — R269 Unspecified abnormalities of gait and mobility: Secondary | ICD-10-CM | POA: Diagnosis not present

## 2017-03-29 DIAGNOSIS — N3944 Nocturnal enuresis: Secondary | ICD-10-CM | POA: Diagnosis not present

## 2017-03-29 DIAGNOSIS — W1839XA Other fall on same level, initial encounter: Secondary | ICD-10-CM | POA: Diagnosis present

## 2017-03-29 DIAGNOSIS — D72825 Bandemia: Secondary | ICD-10-CM | POA: Diagnosis not present

## 2017-03-29 DIAGNOSIS — R600 Localized edema: Secondary | ICD-10-CM | POA: Diagnosis present

## 2017-03-29 DIAGNOSIS — I10 Essential (primary) hypertension: Secondary | ICD-10-CM | POA: Diagnosis present

## 2017-03-29 DIAGNOSIS — E860 Dehydration: Secondary | ICD-10-CM | POA: Diagnosis not present

## 2017-03-29 DIAGNOSIS — Z8261 Family history of arthritis: Secondary | ICD-10-CM | POA: Diagnosis not present

## 2017-03-29 DIAGNOSIS — Z79899 Other long term (current) drug therapy: Secondary | ICD-10-CM | POA: Diagnosis not present

## 2017-03-29 DIAGNOSIS — I129 Hypertensive chronic kidney disease with stage 1 through stage 4 chronic kidney disease, or unspecified chronic kidney disease: Secondary | ICD-10-CM | POA: Diagnosis present

## 2017-03-29 DIAGNOSIS — W19XXXS Unspecified fall, sequela: Secondary | ICD-10-CM | POA: Diagnosis not present

## 2017-03-29 DIAGNOSIS — E7800 Pure hypercholesterolemia, unspecified: Secondary | ICD-10-CM | POA: Diagnosis present

## 2017-03-29 DIAGNOSIS — S72145S Nondisplaced intertrochanteric fracture of left femur, sequela: Secondary | ICD-10-CM | POA: Diagnosis not present

## 2017-03-29 DIAGNOSIS — S8992XA Unspecified injury of left lower leg, initial encounter: Secondary | ICD-10-CM | POA: Diagnosis not present

## 2017-03-29 DIAGNOSIS — R159 Full incontinence of feces: Secondary | ICD-10-CM | POA: Diagnosis not present

## 2017-03-29 DIAGNOSIS — E46 Unspecified protein-calorie malnutrition: Secondary | ICD-10-CM | POA: Diagnosis not present

## 2017-03-29 DIAGNOSIS — S72352A Displaced comminuted fracture of shaft of left femur, initial encounter for closed fracture: Secondary | ICD-10-CM | POA: Diagnosis not present

## 2017-03-29 DIAGNOSIS — L309 Dermatitis, unspecified: Secondary | ICD-10-CM | POA: Diagnosis present

## 2017-03-29 DIAGNOSIS — S72009A Fracture of unspecified part of neck of unspecified femur, initial encounter for closed fracture: Secondary | ICD-10-CM

## 2017-03-29 DIAGNOSIS — E785 Hyperlipidemia, unspecified: Secondary | ICD-10-CM | POA: Diagnosis not present

## 2017-03-29 DIAGNOSIS — Z8249 Family history of ischemic heart disease and other diseases of the circulatory system: Secondary | ICD-10-CM

## 2017-03-29 DIAGNOSIS — S72142D Displaced intertrochanteric fracture of left femur, subsequent encounter for closed fracture with routine healing: Secondary | ICD-10-CM | POA: Diagnosis not present

## 2017-03-29 DIAGNOSIS — R799 Abnormal finding of blood chemistry, unspecified: Secondary | ICD-10-CM | POA: Diagnosis not present

## 2017-03-29 DIAGNOSIS — N189 Chronic kidney disease, unspecified: Secondary | ICD-10-CM | POA: Diagnosis present

## 2017-03-29 DIAGNOSIS — S7292XA Unspecified fracture of left femur, initial encounter for closed fracture: Secondary | ICD-10-CM | POA: Diagnosis present

## 2017-03-29 DIAGNOSIS — G8918 Other acute postprocedural pain: Secondary | ICD-10-CM | POA: Diagnosis not present

## 2017-03-29 LAB — COMPREHENSIVE METABOLIC PANEL
ALBUMIN: 3.4 g/dL — AB (ref 3.5–5.0)
ALK PHOS: 69 U/L (ref 38–126)
ALT: 16 U/L — ABNORMAL LOW (ref 17–63)
ANION GAP: 9 (ref 5–15)
AST: 32 U/L (ref 15–41)
BILIRUBIN TOTAL: 1.1 mg/dL (ref 0.3–1.2)
BUN: 20 mg/dL (ref 6–20)
CALCIUM: 8.6 mg/dL — AB (ref 8.9–10.3)
CO2: 26 mmol/L (ref 22–32)
Chloride: 107 mmol/L (ref 101–111)
Creatinine, Ser: 1.15 mg/dL (ref 0.61–1.24)
GFR, EST NON AFRICAN AMERICAN: 57 mL/min — AB (ref >60)
GLUCOSE: 154 mg/dL — AB (ref 65–99)
POTASSIUM: 4 mmol/L (ref 3.5–5.1)
Sodium: 142 mmol/L (ref 135–145)
TOTAL PROTEIN: 6.2 g/dL — AB (ref 6.5–8.1)

## 2017-03-29 LAB — I-STAT TROPONIN, ED: TROPONIN I, POC: 0.01 ng/mL (ref 0.00–0.08)

## 2017-03-29 LAB — CBC WITH DIFFERENTIAL/PLATELET
Basophils Absolute: 0 10*3/uL (ref 0.0–0.1)
Basophils Relative: 0 %
Eosinophils Absolute: 0 10*3/uL (ref 0.0–0.7)
Eosinophils Relative: 0 %
HEMATOCRIT: 36.2 % — AB (ref 39.0–52.0)
HEMOGLOBIN: 12.4 g/dL — AB (ref 13.0–17.0)
LYMPHS ABS: 1.1 10*3/uL (ref 0.7–4.0)
Lymphocytes Relative: 8 %
MCH: 31.9 pg (ref 26.0–34.0)
MCHC: 34.3 g/dL (ref 30.0–36.0)
MCV: 93.1 fL (ref 78.0–100.0)
MONO ABS: 1.4 10*3/uL — AB (ref 0.1–1.0)
MONOS PCT: 10 %
NEUTROS ABS: 11.8 10*3/uL — AB (ref 1.7–7.7)
NEUTROS PCT: 82 %
Platelets: 156 10*3/uL (ref 150–400)
RBC: 3.89 MIL/uL — ABNORMAL LOW (ref 4.22–5.81)
RDW: 12.8 % (ref 11.5–15.5)
WBC: 14.4 10*3/uL — ABNORMAL HIGH (ref 4.0–10.5)

## 2017-03-29 LAB — ABO/RH: ABO/RH(D): O POS

## 2017-03-29 LAB — URINALYSIS, ROUTINE W REFLEX MICROSCOPIC
BILIRUBIN URINE: NEGATIVE
Glucose, UA: NEGATIVE mg/dL
HGB URINE DIPSTICK: NEGATIVE
KETONES UR: NEGATIVE mg/dL
Leukocytes, UA: NEGATIVE
Nitrite: NEGATIVE
Protein, ur: NEGATIVE mg/dL
Specific Gravity, Urine: 1.02 (ref 1.005–1.030)
pH: 5 (ref 5.0–8.0)

## 2017-03-29 LAB — CK: CK TOTAL: 565 U/L — AB (ref 49–397)

## 2017-03-29 LAB — TYPE AND SCREEN
ABO/RH(D): O POS
Antibody Screen: NEGATIVE

## 2017-03-29 LAB — PROTIME-INR
INR: 1.16
PROTHROMBIN TIME: 14.9 s (ref 11.4–15.2)

## 2017-03-29 MED ORDER — PRAVASTATIN SODIUM 40 MG PO TABS
40.0000 mg | ORAL_TABLET | Freq: Every day | ORAL | Status: DC
  Administered 2017-03-31 – 2017-04-01 (×2): 40 mg via ORAL
  Filled 2017-03-29 (×2): qty 1

## 2017-03-29 MED ORDER — DEXTROSE 5 % IV SOLN
500.0000 mg | Freq: Four times a day (QID) | INTRAVENOUS | Status: DC | PRN
  Filled 2017-03-29: qty 5

## 2017-03-29 MED ORDER — DEXTROSE 5 % IV SOLN
3.0000 g | INTRAVENOUS | Status: DC
  Filled 2017-03-29 (×2): qty 3000

## 2017-03-29 MED ORDER — METHOCARBAMOL 500 MG PO TABS
500.0000 mg | ORAL_TABLET | Freq: Four times a day (QID) | ORAL | Status: DC | PRN
  Administered 2017-03-30: 500 mg via ORAL
  Filled 2017-03-29: qty 1

## 2017-03-29 MED ORDER — MORPHINE SULFATE (PF) 2 MG/ML IV SOLN: 4.0000 mg | INTRAVENOUS | Status: DC | PRN

## 2017-03-29 MED ORDER — POVIDONE-IODINE 10 % EX SWAB: 2.0000 "application " | Freq: Once | CUTANEOUS | Status: DC

## 2017-03-29 MED ORDER — HYDROCODONE-ACETAMINOPHEN 5-325 MG PO TABS
1.0000 | ORAL_TABLET | Freq: Four times a day (QID) | ORAL | Status: DC | PRN
Start: 2017-03-29 — End: 2017-04-01
  Administered 2017-03-30 – 2017-03-31 (×2): 2 via ORAL
  Filled 2017-03-29 (×2): qty 2

## 2017-03-29 MED ORDER — SODIUM CHLORIDE 0.9 % IV SOLN
INTRAVENOUS | Status: DC
  Administered 2017-03-30: 100 mL/h via INTRAVENOUS

## 2017-03-29 MED ORDER — CHLORHEXIDINE GLUCONATE 4 % EX LIQD
60.0000 mL | Freq: Once | CUTANEOUS | Status: AC
  Administered 2017-03-30: 4 via TOPICAL
  Filled 2017-03-29: qty 60

## 2017-03-29 MED ORDER — MORPHINE SULFATE (PF) 4 MG/ML IV SOLN: 0.5000 mg | INTRAVENOUS | Status: DC | PRN

## 2017-03-29 MED ORDER — ONDANSETRON 4 MG PO TBDP
4.0000 mg | ORAL_TABLET | Freq: Once | ORAL | Status: DC | PRN
Start: 2017-03-29 — End: 2017-04-01
  Filled 2017-03-29: qty 1

## 2017-03-29 MED ORDER — TRIAMCINOLONE 0.1 % CREAM:EUCERIN CREAM 1:1: 1.0000 "application " | TOPICAL_CREAM | Freq: Two times a day (BID) | CUTANEOUS | Status: DC

## 2017-03-29 MED ORDER — TRIAMCINOLONE ACETONIDE 0.1 % EX CREA
TOPICAL_CREAM | Freq: Two times a day (BID) | CUTANEOUS | Status: DC
  Administered 2017-03-30 – 2017-04-01 (×3): via TOPICAL
  Filled 2017-03-29: qty 15

## 2017-03-29 MED ORDER — METOPROLOL SUCCINATE ER 50 MG PO TB24
50.0000 mg | ORAL_TABLET | Freq: Every day | ORAL | Status: DC
  Administered 2017-03-30 – 2017-03-31 (×2): 50 mg via ORAL
  Filled 2017-03-29 (×3): qty 1

## 2017-03-29 NOTE — ED Provider Notes (Signed)
[facility] DEPT Provider Note   CSN: [id number] Arrival date & time: 03/29/17  1536     History   Chief Complaint Chief Complaint  Patient presents with  . Fall  . Hip Pain  . Ankle Pain  . Knee Pain    HPI Jaecion Dempster is a 83 y.o. male.Chief complaint is fall, and hip pain.  HPI Mr. Gorka is an 83 year old male who lives independently. He fell last night going into his bedroom and injured his left hip. He was not able to get into bed. He spent the night on the floor. His family came by this morning routinely for a planned visit and found him on the floor and EMS transferred him here.  Past medical history of hypertension, hypercholesterolemia, BPH, osteoporosis, tachycardia/PVCs.  Past Medical History:  Diagnosis Date  . Allergy    seasonal  fall  . Cancer ([place])    Basal cell carcinoma; multiple  . Eczema   . Hyperlipidemia   . Hypertension     Patient Active Problem List   Diagnosis Date Noted  . Leukocytosis 03/29/2017  . Closed left femoral fracture ([place]) 03/29/2017  . Dehydration 03/29/2017  . Mild intermittent asthma with acute exacerbation 01/29/2017  . Chronic renal insufficiency 09/22/2015  . Tachycardia 09/22/2015  . PVC (premature ventricular contraction) 09/22/2015  . Osteoporosis/osteopenia increased risk 06/03/2014  . Basal cell carcinoma of face 06/03/2014  . BPH (benign prostatic hypertrophy) 05/27/2013  . Eczema 04/23/2012  . HTN (hypertension) 04/23/2012  . Dyslipidemia 04/23/2012    Past Surgical History:  Procedure Laterality Date  . CATARACT EXTRACTION, BILATERAL  06/29/2015  . MOHS SURGERY    . TONSILLECTOMY  1940 maybe       Home Medications    Prior to Admission medications   Medication Sig Start Date End Date Taking? Authorizing Provider  albuterol (PROVENTIL HFA;VENTOLIN HFA) 108 (90 Base) MCG/ACT inhaler Inhale 2 puffs into the lungs every 6 (six) hours as needed for wheezing or shortness of breath (cough, shortness of  breath or wheezing.). 01/29/17   Wardell Honour, MD  furosemide (LASIX) 20 MG tablet Take 1 tablet (20 mg total) by mouth daily. 01/29/17   Wardell Honour, MD  lisinopril (PRINIVIL,ZESTRIL) 5 MG tablet Take 1 tablet (5 mg total) by mouth daily. Patient not taking: Reported on 09/25/2016 06/11/16   Wardell Honour, MD  metoprolol succinate (TOPROL-XL) 50 MG 24 hr tablet Take 1 tablet (50 mg total) by mouth daily. 06/11/16   Wardell Honour, MD  pravastatin (PRAVACHOL) 40 MG tablet TAKE 1 TABLET ONCE DAILY. 06/11/16   Wardell Honour, MD  predniSONE (DELTASONE) 20 MG tablet 60mg  daily x 1 day then 50mg  daily x 1 day then 40mg  daily x 1 day then 30mg  daily x 1 day then 20mg  daily x 1 day and then stop 01/29/17   Wardell Honour, MD  Triamcinolone Acetonide (TRIAMCINOLONE 0.1 % CREAM : EUCERIN) CREA Apply 1 application topically 2 (two) times daily. 01/29/17   Wardell Honour, MD  triamcinolone cream (KENALOG) 0.1 % Apply topically 2 (two) times daily. APPLY TWICE DAILY.  1:1 RATIO PLEASE 02/14/17   Wardell Honour, MD    Family History Family History  Problem Relation Age of Onset  . Arthritis Mother   . Heart disease Mother 30  . Hypertension Father     Social History Social History  Substance Use Topics  . Smoking status: Never Smoker  . Smokeless tobacco: Never Used  .  Alcohol use No     Comment: occas     Allergies   Patient has no known allergies.   Review of Systems Review of Systems  Constitutional: Negative for appetite change, chills, diaphoresis, fatigue and fever.  HENT: Negative for mouth sores, sore throat and trouble swallowing.   Eyes: Negative for visual disturbance.  Respiratory: Negative for cough, chest tightness, shortness of breath and wheezing.   Cardiovascular: Negative for chest pain.  Gastrointestinal: Negative for abdominal distention, abdominal pain, diarrhea, nausea and vomiting.  Endocrine: Negative for polydipsia, polyphagia and polyuria.  Genitourinary:  Negative for dysuria, frequency and hematuria.  Musculoskeletal: Positive for arthralgias and gait problem.       Left leg pain.  Skin: Negative for color change, pallor and rash.  Neurological: Negative for dizziness, syncope, light-headedness and headaches.  Hematological: Does not bruise/bleed easily.  Psychiatric/Behavioral: Negative for behavioral problems and confusion.     Physical Exam Updated Vital Signs BP (!) 143/82   Pulse 89   Temp 98 F (36.7 C) (Oral)   Resp 19   SpO2 99%   Physical Exam  Constitutional: He is oriented to person, place, and time. He appears well-developed and well-nourished. No distress.  Awake alert. Pleasant. Oriented and lucid.  HENT:  Head: Normocephalic.  No sign of trauma. Nontender the scalp, skull, and neck and back.  Eyes: Conjunctivae are normal. Pupils are equal, round, and reactive to light. No scleral icterus.  Neck: Normal range of motion. Neck supple. No thyromegaly present.  Cardiovascular: Normal rate and regular rhythm.  Exam reveals no gallop and no friction rub.   No murmur heard. Pulmonary/Chest: Effort normal and breath sounds normal. No respiratory distress. He has no wheezes. He has no rales.  Abdominal: Soft. Bowel sounds are normal. He exhibits no distension. There is no tenderness. There is no rebound.  Musculoskeletal:  External rotation & left leg shortening. Tenderness over the greater trochanter. Tenderness of the left knee at the Lateral malleolus of left ankle.  Neurological: He is alert and oriented to person, place, and time.  Skin: Skin is warm and dry. No rash noted.  Psychiatric: He has a normal mood and affect. His behavior is normal.     ED Treatments / Results  Labs (all labs ordered are listed, but only abnormal results are displayed) Labs Reviewed  CBC WITH DIFFERENTIAL/PLATELET - Abnormal; Notable for the following:       Result Value   WBC 14.4 (*)    RBC 3.89 (*)    Hemoglobin 12.4 (*)    HCT  36.2 (*)    Neutro Abs 11.8 (*)    Monocytes Absolute 1.4 (*)    All other components within normal limits  COMPREHENSIVE METABOLIC PANEL - Abnormal; Notable for the following:    Glucose, Bld 154 (*)    Calcium 8.6 (*)    Total Protein 6.2 (*)    Albumin 3.4 (*)    ALT 16 (*)    GFR calc non Af Amer 57 (*)    All other components within normal limits  CK - Abnormal; Notable for the following:    Total CK 565 (*)    All other components within normal limits  URINALYSIS, ROUTINE W REFLEX MICROSCOPIC  PROTIME-INR  I-STAT TROPOININ, ED  TYPE AND SCREEN  ABO/RH    EKG  EKG Interpretation None       Radiology Dg Chest 1 View  Result Date: 03/29/2017 CLINICAL DATA:  Preop evaluation, known left  hip fracture EXAM: CHEST 1 VIEW COMPARISON:  03/19/2016 FINDINGS: Cardiac shadow is within normal limits. Elevation of the right hemidiaphragm is seen. A hiatal hernia is noted. No focal infiltrate or sizable effusion is noted. No bony abnormality is seen. IMPRESSION: No acute abnormality seen. Electronically Signed   By: Inez Catalina M.D.   On: 03/29/2017 16:38   Dg Ankle Complete Left  Result Date: 03/29/2017 CLINICAL DATA:  Fall, ankle pain EXAM: LEFT ANKLE COMPLETE - 3+ VIEW COMPARISON:  None. FINDINGS: No acute bony abnormality. Specifically, no fracture, subluxation, or dislocation. Soft tissues are intact. IMPRESSION: No acute bony abnormality. Electronically Signed   By: Rolm Baptise M.D.   On: 03/29/2017 16:39   Dg Knee Complete 4 Views Left  Result Date: 03/29/2017 CLINICAL DATA:  Fall.  Pain EXAM: LEFT KNEE - COMPLETE 4+ VIEW COMPARISON:  None. FINDINGS: No acute bony abnormality. Specifically, no fracture, subluxation, or dislocation. Soft tissues are intact. No joint effusion. IMPRESSION: No acute bony abnormality. Electronically Signed   By: Rolm Baptise M.D.   On: 03/29/2017 16:38   Dg Hip Unilat With Pelvis 2-3 Views Left  Result Date: 03/29/2017 CLINICAL DATA:  Set fall, hip  pain EXAM: DG HIP (WITH OR WITHOUT PELVIS) 2-3V LEFT COMPARISON:  None. FINDINGS: There is a comminuted left femoral intertrochanteric fracture. Varus angulation. No subluxation or dislocation. IMPRESSION: Comminuted left femoral intertrochanteric fracture with varus angulation. Electronically Signed   By: Rolm Baptise M.D.   On: 03/29/2017 16:38    Procedures Procedures (including critical care time)  Medications Ordered in ED Medications  ondansetron (ZOFRAN-ODT) disintegrating tablet 4 mg (not administered)  morphine 2 MG/ML injection 4 mg (not administered)     Initial Impression / Assessment and Plan / ED Course  I have reviewed the triage vital signs and the nursing notes.  Pertinent labs & imaging results that were available during my care of the patient were reviewed by me and considered in my medical decision making (see chart for details).    X-ray the ankle, and knee are normal. Hip shows intertrochanteric fracture. I discussed the case with Dr. Alphonzo Severance on call for orthopedics. He requests the patient be transferred to go to hospital to expedite his operative care. She was discussed with patient and family. Discussed with Dr. Darnell Level, of [facility] hospital[facility].  Final Clinical Impressions(s) / ED Diagnoses   Final diagnoses:  Closed fracture of left hip, initial encounter [facility])    New Prescriptions New Prescriptions   No medications on file     Tanna Furry, MD 03/29/17 Joen Laura

## 2017-03-29 NOTE — Progress Notes (Signed)
Left hip intertrochanteric fracture in an 83 year old ambulatory patient who lives alone. X-rays reviewed. Plan transfer to [facility] with operative fixation tomorrow morning Nothing by mouth after midnight I will see the patient either later tonight or tomorrow morning

## 2017-03-29 NOTE — ED Notes (Signed)
Pt in xray

## 2017-03-29 NOTE — ED Notes (Signed)
[facility] called  

## 2017-03-29 NOTE — H&P (Signed)
[name]:[id number] DOB: 1932-09-27 DOA: 03/29/2017     PCP: Wardell Honour, MD  [facility]: None Patient coming from:    home Lives alone,   With family near by    Chief Complaint: hip pain after a fall  HPI: [name] is a 83 y.o. male with medical history significant of HTN,HLD, Eczema, CKD stage 2, chronic leg edema,  Hx of Bigeminy     Presented with a fall that happened last night around 11 PM patient spent all night on the floor unable to get up states that he was trying to get to his bed and misjudged the distance and fell. He did not hit his head did not lose consciousness. He was found at  2 PM today EMS was called on arrival noticed shortening and rotation of the left leg patient was given 75 mg and fentanyl and normal saline 500 mL bolus CBG on arrival 174 BP 137/67 HR 96  Deneis SOB DOE or chest pain at baseline. Able  to walk a flight of stairs and a block without any shortness of breath or chest pain. Have never been diagnosed with heart disease.    Regarding pertinent Chronic problems: hx of HTN well controlled with Metoprolol and lisinopril Patient has significant eczema problem. He requires transfer himself on oral cream as well as prednisone boosts He has chronic lower extremity edema no echogram in assisted IN ER:  Temp (24hrs), Avg:98 F (36.7 C), Min:98 F (36.7 C), Max:98 F (36.7 C)    rr 19, HR 89 BP 143/82 CK 565 Trop 0.01 WBC 14.4 Hg12.4 Na 142 K 4.0 Cr 1.15 alb 3.4 INR 1.16  Left HIP: Comminuted left femoral intertrochanteric fracture with varus Angulation Left ankle :negative Left knee: negative CXR non-acute  Following Medications were ordered in ER: Medications  ondansetron (ZOFRAN-ODT) disintegrating tablet 4 mg (not administered)  morphine 2 MG/ML injection 4 mg (not administered)     ER provider discussed case with:  Dr.Dean with orthopedics he plans to operate in a.m. make nothing by mouth post  midnight.  Hospitalist was called for admission for left Hip intertrochanteric fracture  Review of Systems:    Pertinent positives include: fall, rash Bilateral lower extremity swelling   Constitutional:  No weight loss, night sweats, Fevers, chills, fatigue, weight loss  HEENT:  No headaches, Difficulty swallowing,Tooth/dental problems,Sore throat,  No sneezing, itching, ear ache, nasal congestion, post nasal drip,  Cardio-vascular:  No chest pain, Orthopnea, PND, anasarca, dizziness, palpitations.  GI:  No heartburn, indigestion, abdominal pain, nausea, vomiting, diarrhea, change in bowel habits, loss of appetite, melena, blood in stool, hematemesis Resp:  no shortness of breath at rest. No dyspnea on exertion, No excess mucus, no productive cough, No non-productive cough, No coughing up of blood.No change in color of mucus.No wheezing. Skin:  no or lesions. No jaundice GU:  no dysuria, change in color of urine, no urgency or frequency. No straining to urinate.  No flank pain.  Musculoskeletal:  No joint pain or no joint swelling. No decreased range of motion. No back pain.  Psych:  No change in mood or affect. No depression or anxiety. No memory loss.  Neuro: no localizing neurological complaints, no tingling, no weakness, no double vision, no gait abnormality, no slurred speech, no confusion  As per HPI otherwise 10 point review of systems negative.   Past Medical History: Past Medical History:  Diagnosis Date  . Allergy    seasonal  fall  . Cancer ([place])    Basal cell carcinoma; multiple  . Eczema   . Hyperlipidemia   . Hypertension    Past Surgical History:  Procedure Laterality Date  . CATARACT EXTRACTION, BILATERAL  06/29/2015  . MOHS SURGERY    . TONSILLECTOMY  1940 maybe     Social History:  Ambulatory  Independently    reports that he has never smoked. He has never used smokeless tobacco. He reports that he does not drink alcohol or use  drugs.  Allergies:  No Known Allergies     Family History:   Family History  Problem Relation Age of Onset  . Arthritis Mother   . Heart disease Mother 83  . Hypertension Father     Medications: Prior to Admission medications   Medication Sig Start Date End Date Taking? Authorizing Provider  albuterol (PROVENTIL HFA;VENTOLIN HFA) 108 (90 Base) MCG/ACT inhaler Inhale 2 puffs into the lungs every 6 (six) hours as needed for wheezing or shortness of breath (cough, shortness of breath or wheezing.). 01/29/17   Wardell Honour, MD  furosemide (LASIX) 20 MG tablet Take 1 tablet (20 mg total) by mouth daily. 01/29/17   Wardell Honour, MD  lisinopril (PRINIVIL,ZESTRIL) 5 MG tablet Take 1 tablet (5 mg total) by mouth daily. Patient not taking: Reported on 09/25/2016 06/11/16   Wardell Honour, MD  metoprolol succinate (TOPROL-XL) 50 MG 24 hr tablet Take 1 tablet (50 mg total) by mouth daily. 06/11/16   Wardell Honour, MD  pravastatin (PRAVACHOL) 40 MG tablet TAKE 1 TABLET ONCE DAILY. 06/11/16   Wardell Honour, MD  predniSONE (DELTASONE) 20 MG tablet 47m daily x 1 day then 574mdaily x 1 day then 4086maily x 1 day then 36m51mily x 1 day then 20mg36mly x 1 day and then stop 01/29/17   SmithWardell Honour Triamcinolone Acetonide (TRIAMCINOLONE 0.1 % CREAM : EUCERIN) CREA Apply 1 application topically 2 (two) times daily. 01/29/17   SmithWardell Honour triamcinolone cream (KENALOG) 0.1 % Apply topically 2 (two) times daily. APPLY TWICE DAILY.  1:1 RATIO PLEASE 02/14/17   SmithWardell Honour   Physical Exam: Patient Vitals for the past 24 hrs:  BP Temp Temp src Pulse Resp SpO2  03/29/17 1800 (!) 143/82 - - - 19 -  03/29/17 1715 - - - 89 15 99 %  03/29/17 1546 136/69 98 F (36.7 C) Oral 96 18 96 %    1. General:  in No Acute distress 2. Psychological: Alert and   Oriented 3. Head/ENT:    Dry Mucous Membranes                          Head Non traumatic, neck supple                            Poor Dentition 4. SKIN:   decreased Skin turgor,  Skin clean Dry and intact Eczema noted 5. Heart: Regular rate and rhythm no Murmur, Rub or gallop 6. Lungs:  no wheezes or crackles   7. Abdomen: Soft,   non-tender, Non distended 8. Lower extremities: no clubbing, cyanosis, or edema 9. Neurologically Grossly intact, moving all 4 extremities equally  except for left leg secondary to pain 10. MSK: Normal range of motion limited   movement in left hip secondary to pain Left leg shortened  body mass index is unknown because there is no height or weight on file.  Labs on Admission:   Labs on Admission: I have personally reviewed following labs and imaging studies  CBC:  Recent Labs Lab 03/29/17 1648  WBC 14.4*  NEUTROABS 11.8*  HGB 12.4*  HCT 36.2*  MCV 93.1  PLT 349   Basic Metabolic Panel:  Recent Labs Lab 03/29/17 1648  NA 142  K 4.0  CL 107  CO2 26  GLUCOSE 154*  BUN 20  CREATININE 1.15  CALCIUM 8.6*   GFR: CrCl cannot be calculated (Unknown ideal weight.). Liver Function Tests:  Recent Labs Lab 03/29/17 1648  AST 32  ALT 16*  ALKPHOS 69  BILITOT 1.1  PROT 6.2*  ALBUMIN 3.4*   No results for input(s): LIPASE, AMYLASE in the last 168 hours. No results for input(s): AMMONIA in the last 168 hours. Coagulation Profile:  Recent Labs Lab 03/29/17 1648  INR 1.16   Cardiac Enzymes:  Recent Labs Lab 03/29/17 1737  CKTOTAL 565*   BNP (last 3 results) No results for input(s): PROBNP in the last 8760 hours. HbA1C: No results for input(s): HGBA1C in the last 72 hours. CBG: No results for input(s): GLUCAP in the last 168 hours. Lipid Profile: No results for input(s): CHOL, HDL, LDLCALC, TRIG, CHOLHDL, LDLDIRECT in the last 72 hours. Thyroid Function Tests: No results for input(s): TSH, T4TOTAL, FREET4, T3FREE, THYROIDAB in the last 72 hours. Anemia Panel: No results for input(s): VITAMINB12, FOLATE, FERRITIN, TIBC, IRON, RETICCTPCT in the last 72  hours. Urine analysis:    Component Value Date/Time   COLORURINE YELLOW 03/29/2017 [place] 03/29/2017 1648   LABSPEC 1.020 03/29/2017 1648   PHURINE 5.0 03/29/2017 1648   GLUCOSEU NEGATIVE 03/29/2017 1648   HGBUR NEGATIVE 03/29/2017 1648   BILIRUBINUR NEGATIVE 03/29/2017 1648   BILIRUBINUR negative 09/11/2016 1051   BILIRUBINUR neg 06/03/2014 1014   KETONESUR NEGATIVE 03/29/2017 1648   PROTEINUR NEGATIVE 03/29/2017 1648   UROBILINOGEN 1.0 09/11/2016 1051   NITRITE NEGATIVE 03/29/2017 1648   LEUKOCYTESUR NEGATIVE 03/29/2017 1648   Sepsis Labs: _0 (procalcitonin:4,lacticidven:4) )No results found for this or any previous visit (from the past 240 hour(s)).    UA   no evidence of UTI   No results found for: HGBA1C  CrCl cannot be calculated (Unknown ideal weight.).  BNP (last 3 results) No results for input(s): PROBNP in the last 8760 hours.   ECG REPORT  Independently reviewed Rate: 96  Rhythm: SR ST&T Change: No acute ischemic changes   QTC 448  There were no vitals filed for this visit.   Cultures: No results found for: SDES, [place], CULT, REPTSTATUS   Radiological Exams on Admission: Dg Chest 1 View  Result Date: 03/29/2017 CLINICAL DATA:  Preop evaluation, known left hip fracture EXAM: CHEST 1 VIEW COMPARISON:  03/19/2016 FINDINGS: Cardiac shadow is within normal limits. Elevation of the right hemidiaphragm is seen. A hiatal hernia is noted. No focal infiltrate or sizable effusion is noted. No bony abnormality is seen. IMPRESSION: No acute abnormality seen. Electronically Signed   By: Inez Catalina M.D.   On: 03/29/2017 16:38   Dg Ankle Complete Left  Result Date: 03/29/2017 CLINICAL DATA:  Fall, ankle pain EXAM: LEFT ANKLE COMPLETE - 3+ VIEW COMPARISON:  None. FINDINGS: No acute bony abnormality. Specifically, no fracture, subluxation, or dislocation. Soft tissues are intact. IMPRESSION: No acute bony abnormality. Electronically Signed    By: Rolm Baptise M.D.   On:  03/29/2017 16:39   Dg Knee Complete 4 Views Left  Result Date: 03/29/2017 CLINICAL DATA:  Fall.  Pain EXAM: LEFT KNEE - COMPLETE 4+ VIEW COMPARISON:  None. FINDINGS: No acute bony abnormality. Specifically, no fracture, subluxation, or dislocation. Soft tissues are intact. No joint effusion. IMPRESSION: No acute bony abnormality. Electronically Signed   By: Rolm Baptise M.D.   On: 03/29/2017 16:38   Dg Hip Unilat With Pelvis 2-3 Views Left  Result Date: 03/29/2017 CLINICAL DATA:  Set fall, hip pain EXAM: DG HIP (WITH OR WITHOUT PELVIS) 2-3V LEFT COMPARISON:  None. FINDINGS: There is a comminuted left femoral intertrochanteric fracture. Varus angulation. No subluxation or dislocation. IMPRESSION: Comminuted left femoral intertrochanteric fracture with varus angulation. Electronically Signed   By: Rolm Baptise M.D.   On: 03/29/2017 16:38    Chart has been reviewed    Assessment/Plan   83 y.o. male with medical history significant of HTN,HLD, Eczema, CKD stage 2, chronic leg edema,  Hx of Bigeminy admitted for  left Hip intertrochanteric fracture plan to have it repaired on 6/3 at [facility]   Present on Admission: . Closed left femoral fracture ([place]) - [facility] orthopedics plan to repair tomorrow morning at [facility] will keep nothing by mouth post midnight. Given advanced age patient is at least intermediate risk from cardiovascular standpoint. Able to complete 4 MET no father cardiac workup indicated prior to proceeding to OR . Chronic renal insufficiency - creatinine currently at baseline I would avoid  renal toxic medications . Dyslipidemia - stable continue home medications . HTN (hypertension) - continue metoprolol hold off on lisinopril . Leukocytosis most likely stress related secondary to recent fracture and hemoconcentration secondary to being down for prolonged period of time resulting in dehydration  . Dehydration administer IV fluids and follow  clinically   Other plan as per orders.  DVT prophylaxis:  SCD    Code Status:   DNR/DNI  as per patient    Family Communication:   Family  at  Bedside  plan of care was discussed with   Daughter  Disposition Plan:     likely will need placement for rehabilitation                                                 Would benefit from PT/OT eval prior to DC  Order   per orthopedics                     Social Work   consulted                          Consults called: Orthopedics   Admission status:  inpatient       Level of care        medical floor            I have spent a total of 57 min on this admission  Jaimi Belle 03/29/2017, 8:57 PM    [facility]  Pager [phone]   after 2 AM please page floor coverage PA If 7AM-7PM, please contact the day team taking care of the patient  [email or web address]  Password [email or web address]

## 2017-03-29 NOTE — ED Triage Notes (Signed)
Per EMS, pt fell last night at 11PM. Pt states he has been on the floor all night. Pt states he misjudged the distance to his bed. Pt denies loss of consciousness. Pt received 49mcg of fentanyl and 500cc NS en route to hospital.   Pt has shortening and outward rotation of left leg. Pt complains of pain in his ankle and left upper thigh. Pt A&O x 4.   EMS VITALS: BP 137/67 HR 96 CBG 172

## 2017-03-29 NOTE — ED Notes (Signed)
Bed: WA04 Expected date:  Expected time:  Means of arrival:  Comments: 83 yo leg pain

## 2017-03-30 ENCOUNTER — Inpatient Hospital Stay (HOSPITAL_COMMUNITY): Payer: Medicare Other | Admitting: Certified Registered Nurse Anesthetist

## 2017-03-30 ENCOUNTER — Inpatient Hospital Stay (HOSPITAL_COMMUNITY): Payer: Medicare Other

## 2017-03-30 ENCOUNTER — Encounter (HOSPITAL_COMMUNITY)
Admission: EM | Disposition: A | Discharge status: Rehabilitation Facility | Payer: Self-pay | Source: Home / Self Care | Attending: Internal Medicine

## 2017-03-30 ENCOUNTER — Encounter (HOSPITAL_COMMUNITY): Payer: Self-pay | Admitting: Surgery

## 2017-03-30 DIAGNOSIS — N182 Chronic kidney disease, stage 2 (mild): Secondary | ICD-10-CM

## 2017-03-30 DIAGNOSIS — S72009A Fracture of unspecified part of neck of unspecified femur, initial encounter for closed fracture: Secondary | ICD-10-CM | POA: Diagnosis present

## 2017-03-30 DIAGNOSIS — S72002A Fracture of unspecified part of neck of left femur, initial encounter for closed fracture: Secondary | ICD-10-CM

## 2017-03-30 DIAGNOSIS — E860 Dehydration: Secondary | ICD-10-CM

## 2017-03-30 HISTORY — PX: INTRAMEDULLARY (IM) NAIL INTERTROCHANTERIC: SHX5875

## 2017-03-30 LAB — BASIC METABOLIC PANEL
Anion gap: 10 (ref 5–15)
BUN: 16 mg/dL (ref 6–20)
CHLORIDE: 102 mmol/L (ref 101–111)
CO2: 26 mmol/L (ref 22–32)
Calcium: 8.7 mg/dL — ABNORMAL LOW (ref 8.9–10.3)
Creatinine, Ser: 1.15 mg/dL (ref 0.61–1.24)
GFR calc Af Amer: >60 mL/min (ref >60)
GFR calc non Af Amer: 57 mL/min — ABNORMAL LOW (ref >60)
GLUCOSE: 128 mg/dL — AB (ref 65–99)
POTASSIUM: 3.9 mmol/L (ref 3.5–5.1)
Sodium: 138 mmol/L (ref 135–145)

## 2017-03-30 LAB — CBC
HCT: 35.8 % — ABNORMAL LOW (ref 39.0–52.0)
Hemoglobin: 11.7 g/dL — ABNORMAL LOW (ref 13.0–17.0)
MCH: 30.8 pg (ref 26.0–34.0)
MCHC: 32.7 g/dL (ref 30.0–36.0)
MCV: 94.2 fL (ref 78.0–100.0)
PLATELETS: 160 10*3/uL (ref 150–400)
RBC: 3.8 MIL/uL — AB (ref 4.22–5.81)
RDW: 12.9 % (ref 11.5–15.5)
WBC: 10.6 10*3/uL — ABNORMAL HIGH (ref 4.0–10.5)

## 2017-03-30 LAB — ALBUMIN: Albumin: 2.9 g/dL — ABNORMAL LOW (ref 3.5–5.0)

## 2017-03-30 LAB — MRSA PCR SCREENING: MRSA by PCR: NEGATIVE

## 2017-03-30 SURGERY — FIXATION, FRACTURE, INTERTROCHANTERIC, WITH INTRAMEDULLARY ROD
Anesthesia: General | Site: Hip | Laterality: Left

## 2017-03-30 MED ORDER — ONDANSETRON HCL 4 MG/2ML IJ SOLN
INTRAMUSCULAR | Status: DC | PRN
  Administered 2017-03-30: 4 mg via INTRAVENOUS

## 2017-03-30 MED ORDER — PHENYLEPHRINE HCL 10 MG/ML IJ SOLN
INTRAMUSCULAR | Status: DC | PRN
  Administered 2017-03-30 (×2): 80 ug via INTRAVENOUS

## 2017-03-30 MED ORDER — ROCURONIUM BROMIDE 100 MG/10ML IV SOLN
INTRAVENOUS | Status: DC | PRN
  Administered 2017-03-30: 40 mg via INTRAVENOUS

## 2017-03-30 MED ORDER — POTASSIUM CHLORIDE IN NACL 20-0.9 MEQ/L-% IV SOLN
INTRAVENOUS | Status: DC
  Administered 2017-03-30: 16:00:00 via INTRAVENOUS
  Filled 2017-03-30: qty 1000

## 2017-03-30 MED ORDER — METOCLOPRAMIDE HCL 5 MG PO TABS: 5.0000 mg | ORAL_TABLET | Freq: Three times a day (TID) | ORAL | Status: DC | PRN

## 2017-03-30 MED ORDER — LACTATED RINGERS IV SOLN
INTRAVENOUS | Status: DC | PRN
  Administered 2017-03-30 (×2): via INTRAVENOUS

## 2017-03-30 MED ORDER — HYDROCODONE-ACETAMINOPHEN 5-325 MG PO TABS: 1.0000 | ORAL_TABLET | Freq: Four times a day (QID) | ORAL | Status: DC | PRN

## 2017-03-30 MED ORDER — FENTANYL CITRATE (PF) 100 MCG/2ML IJ SOLN
INTRAMUSCULAR | Status: DC | PRN
  Administered 2017-03-30 (×2): 50 ug via INTRAVENOUS

## 2017-03-30 MED ORDER — RIVAROXABAN 10 MG PO TABS
10.0000 mg | ORAL_TABLET | Freq: Every day | ORAL | Status: DC
  Administered 2017-03-31 – 2017-04-01 (×2): 10 mg via ORAL
  Filled 2017-03-30 (×2): qty 1

## 2017-03-30 MED ORDER — PROPOFOL 10 MG/ML IV BOLUS
INTRAVENOUS | Status: DC | PRN
  Administered 2017-03-30: 100 mg via INTRAVENOUS

## 2017-03-30 MED ORDER — CEFAZOLIN SODIUM 10 G IJ SOLR
INTRAMUSCULAR | Status: DC | PRN
  Administered 2017-03-30: 3 g via INTRAVENOUS

## 2017-03-30 MED ORDER — CEFAZOLIN SODIUM-DEXTROSE 2-4 GM/100ML-% IV SOLN
2.0000 g | Freq: Four times a day (QID) | INTRAVENOUS | Status: AC
  Administered 2017-03-30 – 2017-03-31 (×2): 2 g via INTRAVENOUS
  Filled 2017-03-30 (×2): qty 100

## 2017-03-30 MED ORDER — ONDANSETRON HCL 4 MG PO TABS: 4.0000 mg | ORAL_TABLET | Freq: Four times a day (QID) | ORAL | Status: DC | PRN

## 2017-03-30 MED ORDER — MENTHOL 3 MG MT LOZG: 1.0000 | LOZENGE | OROMUCOSAL | Status: DC | PRN

## 2017-03-30 MED ORDER — SUGAMMADEX SODIUM 200 MG/2ML IV SOLN
INTRAVENOUS | Status: DC | PRN
  Administered 2017-03-30: 200 mg via INTRAVENOUS

## 2017-03-30 MED ORDER — METOCLOPRAMIDE HCL 5 MG/ML IJ SOLN: 5.0000 mg | Freq: Three times a day (TID) | INTRAMUSCULAR | Status: DC | PRN

## 2017-03-30 MED ORDER — MORPHINE SULFATE (PF) 4 MG/ML IV SOLN: 0.5000 mg | INTRAVENOUS | Status: DC | PRN

## 2017-03-30 MED ORDER — PHENOL 1.4 % MT LIQD: 1.0000 | OROMUCOSAL | Status: DC | PRN

## 2017-03-30 MED ORDER — LACTATED RINGERS IV SOLN
INTRAVENOUS | Status: DC
  Administered 2017-03-30: 10:00:00 via INTRAVENOUS

## 2017-03-30 MED ORDER — FENTANYL CITRATE (PF) 100 MCG/2ML IJ SOLN: 25.0000 ug | INTRAMUSCULAR | Status: DC | PRN

## 2017-03-30 MED ORDER — ONDANSETRON HCL 4 MG/2ML IJ SOLN: 4.0000 mg | Freq: Four times a day (QID) | INTRAMUSCULAR | Status: DC | PRN

## 2017-03-30 MED ORDER — LIDOCAINE HCL (CARDIAC) 20 MG/ML IV SOLN
INTRAVENOUS | Status: DC | PRN
  Administered 2017-03-30: 100 mg via INTRAVENOUS

## 2017-03-30 MED ORDER — 0.9 % SODIUM CHLORIDE (POUR BTL) OPTIME
TOPICAL | Status: DC | PRN
  Administered 2017-03-30: 1000 mL

## 2017-03-30 MED ORDER — DEXAMETHASONE SODIUM PHOSPHATE 10 MG/ML IJ SOLN
INTRAMUSCULAR | Status: DC | PRN
  Administered 2017-03-30: 5 mg via INTRAVENOUS

## 2017-03-30 SURGICAL SUPPLY — 49 items
BLADE CLIPPER SURG (BLADE) IMPLANT
BLADE SURG 15 STRL LF DISP TIS (BLADE) IMPLANT
BLADE SURG 15 STRL SS (BLADE)
COVER PERINEAL POST (MISCELLANEOUS) ×3 IMPLANT
COVER SURGICAL LIGHT HANDLE (MISCELLANEOUS) ×3 IMPLANT
DRAPE HALF SHEET 40X57 (DRAPES) IMPLANT
DRAPE ORTHO SPLIT 77X108 STRL (DRAPES) ×2
DRAPE STERI IOBAN 125X83 (DRAPES) ×3 IMPLANT
DRAPE SURG ORHT 6 SPLT 77X108 (DRAPES) ×1 IMPLANT
DRSG AQUACEL AG ADV 3.5X 4 (GAUZE/BANDAGES/DRESSINGS) ×6 IMPLANT
DRSG MEPILEX BORDER 4X4 (GAUZE/BANDAGES/DRESSINGS) ×3 IMPLANT
DRSG MEPILEX BORDER 4X8 (GAUZE/BANDAGES/DRESSINGS) ×3 IMPLANT
DURAPREP 26ML APPLICATOR (WOUND CARE) ×3 IMPLANT
ELECT REM PT RETURN 9FT ADLT (ELECTROSURGICAL) ×3
ELECTRODE REM PT RTRN 9FT ADLT (ELECTROSURGICAL) ×1 IMPLANT
FACESHIELD WRAPAROUND (MASK) ×3 IMPLANT
GAUZE XEROFORM 5X9 LF (GAUZE/BANDAGES/DRESSINGS) ×3 IMPLANT
GLOVE BIOGEL PI IND STRL 8 (GLOVE) ×1 IMPLANT
GLOVE BIOGEL PI INDICATOR 8 (GLOVE) ×2
GLOVE SURG ORTHO 8.0 STRL STRW (GLOVE) ×3 IMPLANT
GOWN STRL REUS W/ TWL LRG LVL3 (GOWN DISPOSABLE) ×1 IMPLANT
GOWN STRL REUS W/ TWL XL LVL3 (GOWN DISPOSABLE) ×1 IMPLANT
GOWN STRL REUS W/TWL LRG LVL3 (GOWN DISPOSABLE) ×2
GOWN STRL REUS W/TWL XL LVL3 (GOWN DISPOSABLE) ×2
GUIDE PIN 3.2X343 (PIN) ×2
GUIDE PIN 3.2X343MM (PIN) ×4
KIT BASIN OR (CUSTOM PROCEDURE TRAY) ×3 IMPLANT
KIT ROOM TURNOVER OR (KITS) ×3 IMPLANT
LINER BOOT UNIVERSAL DISP (MISCELLANEOUS) ×3 IMPLANT
MANIFOLD NEPTUNE II (INSTRUMENTS) ×3 IMPLANT
NAIL TRIGEN 10MMX40CM-125 LEFT (Nail) ×3 IMPLANT
NS IRRIG 1000ML POUR BTL (IV SOLUTION) ×3 IMPLANT
PACK GENERAL/GYN (CUSTOM PROCEDURE TRAY) ×3 IMPLANT
PAD ARMBOARD 7.5X6 YLW CONV (MISCELLANEOUS) ×6 IMPLANT
PIN GUIDE 3.2X343MM (PIN) ×2 IMPLANT
SCREW LAG COMPR KIT 105/100 (Screw) ×3 IMPLANT
SPONGE LAP 4X18 X RAY DECT (DISPOSABLE) IMPLANT
STAPLER VISISTAT 35W (STAPLE) ×3 IMPLANT
SUT ETHILON 2 0 FS 18 (SUTURE) ×6 IMPLANT
SUT ETHILON 3 0 FSL (SUTURE) ×3 IMPLANT
SUT VIC AB 0 CT1 27 (SUTURE) ×4
SUT VIC AB 0 CT1 27XBRD ANBCTR (SUTURE) ×2 IMPLANT
SUT VIC AB 2-0 CT1 27 (SUTURE) ×2
SUT VIC AB 2-0 CT1 TAPERPNT 27 (SUTURE) ×1 IMPLANT
SUT VIC AB 2-0 CTB1 (SUTURE) ×3 IMPLANT
TAPE STRIPS DRAPE STRL (GAUZE/BANDAGES/DRESSINGS) IMPLANT
TOWEL OR 17X24 6PK STRL BLUE (TOWEL DISPOSABLE) ×3 IMPLANT
TOWEL OR 17X26 10 PK STRL BLUE (TOWEL DISPOSABLE) ×3 IMPLANT
WATER STERILE IRR 1000ML POUR (IV SOLUTION) ×3 IMPLANT

## 2017-03-30 NOTE — Brief Op Note (Signed)
03/29/2017 - 03/30/2017  1:11 PM  PATIENT:  [name]  83 y.o. male  PRE-OPERATIVE DIAGNOSIS:  Left hip intertrochanteric fracture  POST-OPERATIVE DIAGNOSIS:  Same  PROCEDURE:  Procedure(s): INTRAMEDULLARY (IM) NAIL INTERTROCHANTRIC (Left)  SURGEON:  Surgeon(s) and Role:    * [name], MD - Primary  PHYSICIAN ASSISTANT:   ASSISTANTS: none   ANESTHESIA:   general  EBL:  Total I/O In: -  Out: 75 [Blood:75]  BLOOD ADMINISTERED:none  DRAINS: none   LOCAL MEDICATIONS USED:  NONE  SPECIMEN:  No Specimen  DISPOSITION OF SPECIMEN:  N/A  COUNTS:  YES  TOURNIQUET:  * No tourniquets in log *  DICTATION: .Other Dictation: Dictation Number [id number]  PLAN OF CARE: Admit to inpatient   PATIENT DISPOSITION:  PACU - hemodynamically stable.   Delay start of Pharmacological VTE agent (>24hrs) due to surgical blood loss or risk of bleeding: not applicable

## 2017-03-30 NOTE — Transfer of Care (Signed)
Immediate Anesthesia Transfer of Care Note  Patient: [name]  Procedure(s) Performed: Procedure(s): INTRAMEDULLARY (IM) NAIL INTERTROCHANTRIC (Left)  Patient Location: PACU  Anesthesia Type:General  Level of Consciousness: awake, alert  and patient cooperative  Airway & Oxygen Therapy: Patient Spontanous Breathing and Patient connected to nasal cannula oxygen  Post-op Assessment: Report given to RN and Post -op Vital signs reviewed and stable  Post vital signs: Reviewed and stable  Last Vitals:  Vitals:   03/30/17 0345 03/30/17 0926  BP: 127/60 (!) 124/53  Pulse: 90 91  Resp:  (!) 24  Temp: 36.5 C 36.7 C    Last Pain:  Vitals:   03/30/17 0926  TempSrc: Oral  PainSc:          Complications: No apparent anesthesia complications

## 2017-03-30 NOTE — Consult Note (Signed)
Reason for Consult: Left hip pain Referring Physician: Dr. [name] is an 83 y.o. male.  HPI: [name] is an 83 year old ambulatory patient who fell yesterday at his home. This was a mechanical fall. There was no syncope. He describes left hip pain and left ankle pain. Left ankle radiographs in the ER were negative for fracture. Hip radiographs demonstrate intertrochanteric fracture. Patient presents now for operative management.  Past Medical History:  Diagnosis Date  . Allergy    seasonal  fall  . Cancer ([place])    Basal cell carcinoma; multiple  . Eczema   . Hyperlipidemia   . Hypertension     Past Surgical History:  Procedure Laterality Date  . CATARACT EXTRACTION, BILATERAL  06/29/2015  . MOHS SURGERY    . TONSILLECTOMY  1940 maybe    Family History  Problem Relation Age of Onset  . Arthritis Mother   . Heart disease Mother 62  . Hypertension Father     Social History:  reports that he has never smoked. He has never used smokeless tobacco. He reports that he does not drink alcohol or use drugs.  Allergies: No Known Allergies  Medications: I have reviewed the patient's current medications.  Results for orders placed or performed during the hospital encounter of 03/29/17 (from the past 48 hour(s))  CBC with Differential/Platelet     Status: Abnormal   Collection Time: 03/29/17  4:48 PM  Result Value Ref Range   WBC 14.4 (H) 4.0 - 10.5 K/uL   RBC 3.89 (L) 4.22 - 5.81 MIL/uL   Hemoglobin 12.4 (L) 13.0 - 17.0 g/dL   HCT 36.2 (L) 39.0 - 52.0 %   MCV 93.1 78.0 - 100.0 fL   MCH 31.9 26.0 - 34.0 pg   MCHC 34.3 30.0 - 36.0 g/dL   RDW 12.8 11.5 - 15.5 %   Platelets 156 150 - 400 K/uL   Neutrophils Relative % 82 %   Neutro Abs 11.8 (H) 1.7 - 7.7 K/uL   Lymphocytes Relative 8 %   Lymphs Abs 1.1 0.7 - 4.0 K/uL   Monocytes Relative 10 %   Monocytes Absolute 1.4 (H) 0.1 - 1.0 K/uL   Eosinophils Relative 0 %   Eosinophils Absolute 0.0 0.0 - 0.7 K/uL   Basophils  Relative 0 %   Basophils Absolute 0.0 0.0 - 0.1 K/uL  Comprehensive metabolic panel     Status: Abnormal   Collection Time: 03/29/17  4:48 PM  Result Value Ref Range   Sodium 142 135 - 145 mmol/L   Potassium 4.0 3.5 - 5.1 mmol/L   Chloride 107 101 - 111 mmol/L   CO2 26 22 - 32 mmol/L   Glucose, Bld 154 (H) 65 - 99 mg/dL   BUN 20 6 - 20 mg/dL   Creatinine, Ser 1.15 0.61 - 1.24 mg/dL   Calcium 8.6 (L) 8.9 - 10.3 mg/dL   Total Protein 6.2 (L) 6.5 - 8.1 g/dL   Albumin 3.4 (L) 3.5 - 5.0 g/dL   AST 32 15 - 41 U/L   ALT 16 (L) 17 - 63 U/L   Alkaline Phosphatase 69 38 - 126 U/L   Total Bilirubin 1.1 0.3 - 1.2 mg/dL   GFR calc non Af Amer 57 (L) >60 mL/min   GFR calc Af Amer >60 >60 mL/min    Comment: (NOTE) The eGFR has been calculated using the CKD EPI equation. This calculation has not been validated in all clinical situations. eGFR's persistently <60 mL/min signify  possible Chronic Kidney Disease.    Anion gap 9 5 - 15  Urinalysis, Routine w reflex microscopic     Status: None   Collection Time: 03/29/17  4:48 PM  Result Value Ref Range   Color, Urine YELLOW YELLOW   APPearance CLEAR CLEAR   Specific Gravity, Urine 1.020 1.005 - 1.030   pH 5.0 5.0 - 8.0   Glucose, UA NEGATIVE NEGATIVE mg/dL   Hgb urine dipstick NEGATIVE NEGATIVE   Bilirubin Urine NEGATIVE NEGATIVE   Ketones, ur NEGATIVE NEGATIVE mg/dL   Protein, ur NEGATIVE NEGATIVE mg/dL   Nitrite NEGATIVE NEGATIVE   Leukocytes, UA NEGATIVE NEGATIVE  Protime-INR     Status: None   Collection Time: 03/29/17  4:48 PM  Result Value Ref Range   Prothrombin Time 14.9 11.4 - 15.2 seconds   INR 1.16   Type and screen     Status: None   Collection Time: 03/29/17  4:48 PM  Result Value Ref Range   ABO/RH(D) O POS    Antibody Screen NEG    Sample Expiration 04/01/2017   ABO/Rh     Status: None   Collection Time: 03/29/17  4:50 PM  Result Value Ref Range   ABO/RH(D) O POS   I-stat troponin, ED     Status: None   Collection  Time: 03/29/17  5:02 PM  Result Value Ref Range   Troponin i, poc 0.01 0.00 - 0.08 ng/mL   Comment 3            Comment: Due to the release kinetics of cTnI, a negative result within the first hours of the onset of symptoms does not rule out myocardial infarction with certainty. If myocardial infarction is still suspected, repeat the test at appropriate intervals.   CK     Status: Abnormal   Collection Time: 03/29/17  5:37 PM  Result Value Ref Range   Total CK 565 (H) 49 - 397 U/L  MRSA PCR Screening     Status: None   Collection Time: 03/29/17 11:25 PM  Result Value Ref Range   MRSA by PCR NEGATIVE NEGATIVE    Comment:        The GeneXpert MRSA Assay (FDA approved for NASAL specimens only), is one component of a comprehensive MRSA colonization surveillance program. It is not intended to diagnose MRSA infection nor to guide or monitor treatment for MRSA infections.   CBC     Status: Abnormal   Collection Time: 03/30/17  5:20 AM  Result Value Ref Range   WBC 10.6 (H) 4.0 - 10.5 K/uL   RBC 3.80 (L) 4.22 - 5.81 MIL/uL   Hemoglobin 11.7 (L) 13.0 - 17.0 g/dL   HCT 35.8 (L) 39.0 - 52.0 %   MCV 94.2 78.0 - 100.0 fL   MCH 30.8 26.0 - 34.0 pg   MCHC 32.7 30.0 - 36.0 g/dL   RDW 12.9 11.5 - 15.5 %   Platelets 160 150 - 400 K/uL  Basic metabolic panel     Status: Abnormal   Collection Time: 03/30/17  5:20 AM  Result Value Ref Range   Sodium 138 135 - 145 mmol/L   Potassium 3.9 3.5 - 5.1 mmol/L   Chloride 102 101 - 111 mmol/L   CO2 26 22 - 32 mmol/L   Glucose, Bld 128 (H) 65 - 99 mg/dL   BUN 16 6 - 20 mg/dL   Creatinine, Ser 1.15 0.61 - 1.24 mg/dL   Calcium 8.7 (L) 8.9 - 10.3 mg/dL  GFR calc non Af Amer 57 (L) >60 mL/min   GFR calc Af Amer >60 >60 mL/min    Comment: (NOTE) The eGFR has been calculated using the CKD EPI equation. This calculation has not been validated in all clinical situations. eGFR's persistently <60 mL/min signify possible Chronic Kidney Disease.     Anion gap 10 5 - 15  Albumin     Status: Abnormal   Collection Time: 03/30/17  5:20 AM  Result Value Ref Range   Albumin 2.9 (L) 3.5 - 5.0 g/dL    Dg Chest 1 View  Result Date: 03/29/2017 CLINICAL DATA:  Preop evaluation, known left hip fracture EXAM: CHEST 1 VIEW COMPARISON:  03/19/2016 FINDINGS: Cardiac shadow is within normal limits. Elevation of the right hemidiaphragm is seen. A hiatal hernia is noted. No focal infiltrate or sizable effusion is noted. No bony abnormality is seen. IMPRESSION: No acute abnormality seen. Electronically Signed   By: Inez Catalina M.D.   On: 03/29/2017 16:38   Dg Ankle Complete Left  Result Date: 03/29/2017 CLINICAL DATA:  Fall, ankle pain EXAM: LEFT ANKLE COMPLETE - 3+ VIEW COMPARISON:  None. FINDINGS: No acute bony abnormality. Specifically, no fracture, subluxation, or dislocation. Soft tissues are intact. IMPRESSION: No acute bony abnormality. Electronically Signed   By: Rolm Baptise M.D.   On: 03/29/2017 16:39   Dg Knee Complete 4 Views Left  Result Date: 03/29/2017 CLINICAL DATA:  Fall.  Pain EXAM: LEFT KNEE - COMPLETE 4+ VIEW COMPARISON:  None. FINDINGS: No acute bony abnormality. Specifically, no fracture, subluxation, or dislocation. Soft tissues are intact. No joint effusion. IMPRESSION: No acute bony abnormality. Electronically Signed   By: Rolm Baptise M.D.   On: 03/29/2017 16:38   Dg Hip Unilat With Pelvis 2-3 Views Left  Result Date: 03/29/2017 CLINICAL DATA:  Set fall, hip pain EXAM: DG HIP (WITH OR WITHOUT PELVIS) 2-3V LEFT COMPARISON:  None. FINDINGS: There is a comminuted left femoral intertrochanteric fracture. Varus angulation. No subluxation or dislocation. IMPRESSION: Comminuted left femoral intertrochanteric fracture with varus angulation. Electronically Signed   By: Rolm Baptise M.D.   On: 03/29/2017 16:38    Review of Systems  Musculoskeletal: Positive for joint pain.  All other systems reviewed and are negative.  Blood pressure  127/60, pulse 90, temperature 97.7 F (36.5 C), temperature source Oral, resp. rate 19, SpO2 94 %. Physical Exam  Constitutional: He appears well-developed.  HENT:  Head: Normocephalic.  Eyes: Pupils are equal, round, and reactive to light.  Neck: Normal range of motion.  Cardiovascular: Normal rate.   Respiratory: Effort normal.  Neurological: He is alert.  Skin: Skin is warm.  Psychiatric: He has a normal mood and affect.  Left lower extremity demonstrates ankle dorsi/plantar flexion intact pedal pulses intact. Ankle has no effusion crepitus or swelling. Knee on the left has no effusion or swelling.   Bilateral operations demonstrate intact grip EPL and interosseous wrist flexion 6 extension biceps triceps and upper strength. Not much in way of bruising crepitus or pain with range of motion of his upper extremity joints Assessment/Plan: Impression is left intertrochanteric hip fracture. Plan is open reduction internal fixation. Risks and benefits discussed with the patient including minute limited to infection or redness of damage nonunion malunion potential need for surgery. All questions answered.  G Scott Beckam Abdulaziz 03/30/2017, 7:45 AM

## 2017-03-30 NOTE — Anesthesia Preprocedure Evaluation (Signed)
Anesthesia Evaluation  Patient identified by MRN, date of birth, ID band  Reviewed: Allergy & Precautions, NPO status , Patient's Chart, lab work & pertinent test results  Airway Mallampati: II  TM Distance: >3 FB     Dental   Pulmonary asthma ,    breath sounds clear to auscultation       Cardiovascular hypertension,  Rhythm:Regular Rate:Normal     Neuro/Psych    GI/Hepatic negative GI ROS, Neg liver ROS,   Endo/Other    Renal/GU Renal disease     Musculoskeletal   Abdominal   Peds  Hematology   Anesthesia Other Findings   Reproductive/Obstetrics                             Anesthesia Physical Anesthesia Plan  ASA: III  Anesthesia Plan: General   Post-op Pain Management:    Induction: Intravenous  Airway Management Planned: Oral ETT  Additional Equipment:   Intra-op Plan:   Post-operative Plan: Possible Post-op intubation/ventilation  Informed Consent: I have reviewed the patients History and Physical, chart, labs and discussed the procedure including the risks, benefits and alternatives for the proposed anesthesia with the patient or authorized representative who has indicated his/her understanding and acceptance.   Dental advisory given  Plan Discussed with: CRNA and Anesthesiologist  Anesthesia Plan Comments:         Anesthesia Quick Evaluation

## 2017-03-30 NOTE — Anesthesia Procedure Notes (Signed)
Procedure Name: Intubation Performed by: Valda Favia Pre-anesthesia Checklist: Patient identified, Emergency Drugs available, Suction available, Patient being monitored and Timeout performed Patient Re-evaluated:Patient Re-evaluated prior to inductionOxygen Delivery Method: Circle system utilized Preoxygenation: Pre-oxygenation with 100% oxygen Intubation Type: IV induction Ventilation: Mask ventilation without difficulty Laryngoscope Size: Mac and 4 Grade View: Grade I Tube type: Oral Tube size: 7.5 mm Number of attempts: 1 Airway Equipment and Method: Stylet Placement Confirmation: ETT inserted through vocal cords under direct vision,  positive ETCO2 and breath sounds checked- equal and bilateral Secured at: 22 cm Tube secured with: Tape Dental Injury: Teeth and Oropharynx as per pre-operative assessment

## 2017-03-30 NOTE — Progress Notes (Signed)
PROGRESS NOTE    [name]  QAS:[id number] DOB: 1932/05/30 DOA: 03/29/2017 PCP: Wardell Honour, MD  Brief Narrative:[name] is a 83 y.o. male with medical history significant of HTN,HLD, Eczema, CKD stage 2, chronic leg edema,  Hx of Bigeminy. Presented to the ED after a mechanical fall, noted to have a Hip Fracture  Assessment & Plan:  . Closed left femoral fracture ([place])  -Orthopedics consulting plan for OR today -start DVT proph post op    HTN -Stable, continue metoprolol, hold lisinopril  . Dyslipidemia  - stable continue home medications  . Leukocytosis - reactive, monitor, afebrile  H/o CKD 2 -creatinine in normal range, monitor  DVT prophylaxis: add lovenox after surgery Code Status: DNR Family Communication:none at bedsode Disposition Plan: will need SNF  Consultants:   Ortho Dr.Dean   Subjective: Feels ok, awaiting surgery  Objective: Vitals:   03/29/17 1800 03/29/17 2250 03/30/17 0345 03/30/17 0926  BP: (!) 143/82 (!) 141/46 127/60 (!) 124/53  Pulse:  (!) 104 90 91  Resp: 19   (!) 24  Temp:  98.9 F (37.2 C) 97.7 F (36.5 C) 98.1 F (36.7 C)  TempSrc:  Axillary Oral Oral  SpO2:  98% 94% 96%    Intake/Output Summary (Last 24 hours) at 03/30/17 1250 Last data filed at 03/30/17 0550  Gross per 24 hour  Intake             1.67 ml  Output              225 ml  Net          -223.33 ml   There were no vitals filed for this visit.  Examination:  General exam: Appears calm and comfortable  Respiratory system: Clear to auscultation. Respiratory effort normal. Cardiovascular system: S1 & S2 heard, RRR. No JVD, murmurs, rubs, gallops or clicks. No pedal edema. Gastrointestinal system: Abdomen is nondistended, soft and nontender. Central nervous system: Alert and oriented. No focal neurological deficits. Extremities: LLE ext rotated and shortened Skin: No rashes, lesions or ulcers Psychiatry: Judgement and insight appear normal. Mood & affect  appropriate.     Data Reviewed:   CBC:  Recent Labs Lab 03/29/17 1648 03/30/17 0520  WBC 14.4* 10.6*  NEUTROABS 11.8*  --   HGB 12.4* 11.7*  HCT 36.2* 35.8*  MCV 93.1 94.2  PLT 156 798   Basic Metabolic Panel:  Recent Labs Lab 03/29/17 1648 03/30/17 0520  NA 142 138  K 4.0 3.9  CL 107 102  CO2 26 26  GLUCOSE 154* 128*  BUN 20 16  CREATININE 1.15 1.15  CALCIUM 8.6* 8.7*   GFR: CrCl cannot be calculated (Unknown ideal weight.). Liver Function Tests:  Recent Labs Lab 03/29/17 1648 03/30/17 0520  AST 32  --   ALT 16*  --   ALKPHOS 69  --   BILITOT 1.1  --   PROT 6.2*  --   ALBUMIN 3.4* 2.9*   No results for input(s): LIPASE, AMYLASE in the last 168 hours. No results for input(s): AMMONIA in the last 168 hours. Coagulation Profile:  Recent Labs Lab 03/29/17 1648  INR 1.16   Cardiac Enzymes:  Recent Labs Lab 03/29/17 1737  CKTOTAL 565*   BNP (last 3 results) No results for input(s): PROBNP in the last 8760 hours. HbA1C: No results for input(s): HGBA1C in the last 72 hours. CBG: No results for input(s): GLUCAP in the last 168 hours. Lipid Profile: No results for input(s): CHOL, HDL,  LDLCALC, TRIG, CHOLHDL, LDLDIRECT in the last 72 hours. Thyroid Function Tests: No results for input(s): TSH, T4TOTAL, FREET4, T3FREE, THYROIDAB in the last 72 hours. Anemia Panel: No results for input(s): VITAMINB12, FOLATE, FERRITIN, TIBC, IRON, RETICCTPCT in the last 72 hours. Urine analysis:    Component Value Date/Time   COLORURINE YELLOW 03/29/2017 1648   APPEARANCEUR CLEAR 03/29/2017 1648   LABSPEC 1.020 03/29/2017 1648   PHURINE 5.0 03/29/2017 1648   GLUCOSEU NEGATIVE 03/29/2017 1648   HGBUR NEGATIVE 03/29/2017 1648   BILIRUBINUR NEGATIVE 03/29/2017 1648   BILIRUBINUR negative 09/11/2016 1051   BILIRUBINUR neg 06/03/2014 1014   KETONESUR NEGATIVE 03/29/2017 1648   PROTEINUR NEGATIVE 03/29/2017 1648   UROBILINOGEN 1.0 09/11/2016 1051   NITRITE  NEGATIVE 03/29/2017 1648   LEUKOCYTESUR NEGATIVE 03/29/2017 1648   Sepsis Labs: @LABRCNTIP (procalcitonin:4,lacticidven:4)  ) Recent Results (from the past 240 hour(s))  MRSA PCR Screening     Status: None   Collection Time: 03/29/17 11:25 PM  Result Value Ref Range Status   MRSA by PCR NEGATIVE NEGATIVE Final    Comment:        The GeneXpert MRSA Assay (FDA approved for NASAL specimens only), is one component of a comprehensive MRSA colonization surveillance program. It is not intended to diagnose MRSA infection nor to guide or monitor treatment for MRSA infections.          Radiology Studies: Dg Chest 1 View  Result Date: 03/29/2017 CLINICAL DATA:  Preop evaluation, known left hip fracture EXAM: CHEST 1 VIEW COMPARISON:  03/19/2016 FINDINGS: Cardiac shadow is within normal limits. Elevation of the right hemidiaphragm is seen. A hiatal hernia is noted. No focal infiltrate or sizable effusion is noted. No bony abnormality is seen. IMPRESSION: No acute abnormality seen. Electronically Signed   By: Inez Catalina M.D.   On: 03/29/2017 16:38   Dg Ankle Complete Left  Result Date: 03/29/2017 CLINICAL DATA:  Fall, ankle pain EXAM: LEFT ANKLE COMPLETE - 3+ VIEW COMPARISON:  None. FINDINGS: No acute bony abnormality. Specifically, no fracture, subluxation, or dislocation. Soft tissues are intact. IMPRESSION: No acute bony abnormality. Electronically Signed   By: Rolm Baptise M.D.   On: 03/29/2017 16:39   Dg Knee Complete 4 Views Left  Result Date: 03/29/2017 CLINICAL DATA:  Fall.  Pain EXAM: LEFT KNEE - COMPLETE 4+ VIEW COMPARISON:  None. FINDINGS: No acute bony abnormality. Specifically, no fracture, subluxation, or dislocation. Soft tissues are intact. No joint effusion. IMPRESSION: No acute bony abnormality. Electronically Signed   By: Rolm Baptise M.D.   On: 03/29/2017 16:38   Dg Hip Unilat With Pelvis 2-3 Views Left  Result Date: 03/29/2017 CLINICAL DATA:  Set fall, hip pain EXAM:  DG HIP (WITH OR WITHOUT PELVIS) 2-3V LEFT COMPARISON:  None. FINDINGS: There is a comminuted left femoral intertrochanteric fracture. Varus angulation. No subluxation or dislocation. IMPRESSION: Comminuted left femoral intertrochanteric fracture with varus angulation. Electronically Signed   By: Rolm Baptise M.D.   On: 03/29/2017 16:38        Scheduled Meds: . [MAR Hold] metoprolol succinate  50 mg Oral Daily  . povidone-iodine  2 application Topical Once  . [MAR Hold] pravastatin  40 mg Oral Daily  . [MAR Hold] triamcinolone cream   Topical BID   Continuous Infusions: .  ceFAZolin (ANCEF) IV    . lactated ringers 10 mL/hr at 03/30/17 1004  . [MAR Hold] methocarbamol (ROBAXIN)  IV       LOS: 1 day    Time  spent: 68min    Domenic Polite, MD [facility] Pager [phone]  If 7PM-7AM, please contact night-coverage [email or web address] Password [email or web address] 03/30/2017, 12:50 PM

## 2017-03-30 NOTE — Anesthesia Postprocedure Evaluation (Signed)
Anesthesia Post Note  Patient: [name]  Procedure(s) Performed: Procedure(s) (LRB): INTRAMEDULLARY (IM) NAIL INTERTROCHANTRIC (Left)     Patient location during evaluation: PACU Anesthesia Type: General Level of consciousness: awake Pain management: pain level controlled Vital Signs Assessment: post-procedure vital signs reviewed and stable Respiratory status: spontaneous breathing Cardiovascular status: stable Anesthetic complications: no    Last Vitals:  Vitals:   03/30/17 1445 03/30/17 1512  BP: 135/83 (!) 116/55  Pulse: 92 (!) 59  Resp: (!) 22   Temp:  36.4 C    Last Pain:  Vitals:   03/30/17 1513  TempSrc:   PainSc: 1                  Mi Balla

## 2017-03-31 ENCOUNTER — Encounter (HOSPITAL_COMMUNITY): Payer: Self-pay | Admitting: Orthopedic Surgery

## 2017-03-31 DIAGNOSIS — D72829 Elevated white blood cell count, unspecified: Secondary | ICD-10-CM

## 2017-03-31 DIAGNOSIS — S72002D Fracture of unspecified part of neck of left femur, subsequent encounter for closed fracture with routine healing: Secondary | ICD-10-CM

## 2017-03-31 LAB — PROTIME-INR
INR: 1.2
PROTHROMBIN TIME: 15.3 s — AB (ref 11.4–15.2)

## 2017-03-31 LAB — BASIC METABOLIC PANEL
Anion gap: 6 (ref 5–15)
BUN: 15 mg/dL (ref 6–20)
CO2: 26 mmol/L (ref 22–32)
CREATININE: 0.99 mg/dL (ref 0.61–1.24)
Calcium: 8.3 mg/dL — ABNORMAL LOW (ref 8.9–10.3)
Chloride: 105 mmol/L (ref 101–111)
GFR calc Af Amer: >60 mL/min (ref >60)
Glucose, Bld: 117 mg/dL — ABNORMAL HIGH (ref 65–99)
Potassium: 4.2 mmol/L (ref 3.5–5.1)
SODIUM: 137 mmol/L (ref 135–145)

## 2017-03-31 LAB — CBC
HCT: 30.2 % — ABNORMAL LOW (ref 39.0–52.0)
Hemoglobin: 9.9 g/dL — ABNORMAL LOW (ref 13.0–17.0)
MCH: 30.7 pg (ref 26.0–34.0)
MCHC: 32.8 g/dL (ref 30.0–36.0)
MCV: 93.8 fL (ref 78.0–100.0)
PLATELETS: 129 10*3/uL — AB (ref 150–400)
RBC: 3.22 MIL/uL — AB (ref 4.22–5.81)
RDW: 12.4 % (ref 11.5–15.5)
WBC: 13.3 10*3/uL — ABNORMAL HIGH (ref 4.0–10.5)

## 2017-03-31 LAB — VITAMIN D 25 HYDROXY (VIT D DEFICIENCY, FRACTURES): VIT D 25 HYDROXY: 36.2 ng/mL (ref 30.0–100.0)

## 2017-03-31 MED ORDER — RIVAROXABAN 10 MG PO TABS: 10.0000 mg | ORAL_TABLET | Freq: Every day | ORAL | 0 refills | Status: DC

## 2017-03-31 MED ORDER — HYDROCODONE-ACETAMINOPHEN 5-325 MG PO TABS: 1.0000 | ORAL_TABLET | Freq: Four times a day (QID) | ORAL | 0 refills | Status: DC | PRN

## 2017-03-31 NOTE — Consult Note (Signed)
Physical Medicine and Rehabilitation Consult Reason for Consult: Decreased functional mobility Referring Physician: [facility]   HPI: [name] is a 83 y.o. right handed male with history of hypertension, CKD stage II and chronic leg edema. Per chart review and patient, patient independent living alone prior to admission. He has a supportive daughter lives close by. 1 level home with 2 steps to entry. Presented 03/29/2017 after a fall of which he was down for extended time. No loss of consciousness. X-rays and imaging revealed left intertrochanteric hip fracture. Underwent IM nailing 03/30/2017 per Dr. Marlou Sa. Partial weightbearing left lower extremity of 25 pounds. Acute blood loss anemia 9.9 and monitored. Maintained on Xarelto for DVT prophylaxis. Physical therapy evaluation completed with recommendations of physical medicine rehabilitation consult.  Review of Systems  Constitutional: Negative for chills and fever.  HENT: Negative for hearing loss.   Eyes: Negative for blurred vision and double vision.  Respiratory: Negative for cough and shortness of breath.   Cardiovascular: Positive for leg swelling. Negative for palpitations.  Gastrointestinal: Positive for constipation. Negative for nausea and vomiting.  Genitourinary: Positive for urgency. Negative for dysuria, flank pain and hematuria.  Musculoskeletal: Positive for falls and myalgias.  Skin: Negative for rash.  Neurological: Negative for seizures, loss of consciousness and weakness.  All other systems reviewed and are negative.  Past Medical History:  Diagnosis Date  . Allergy    seasonal  fall  . Cancer ([place])    Basal cell carcinoma; multiple  . Eczema   . Hyperlipidemia   . Hypertension    Past Surgical History:  Procedure Laterality Date  . CATARACT EXTRACTION, BILATERAL  06/29/2015  . INTRAMEDULLARY (IM) NAIL INTERTROCHANTERIC Left 03/30/2017   Procedure: INTRAMEDULLARY (IM) NAIL INTERTROCHANTRIC;  Surgeon: [name], MD;  Location: [facility];  Service: Orthopedics;  Laterality: Left;  . MOHS SURGERY    . TONSILLECTOMY  1940 maybe   Family History  Problem Relation Age of Onset  . Arthritis Mother   . Heart disease Mother 11  . Hypertension Father    Social History:  reports that he has never smoked. He has never used smokeless tobacco. He reports that he does not drink alcohol or use drugs. Allergies: No Known Allergies Medications Prior to Admission  Medication Sig Dispense Refill  . albuterol (PROVENTIL HFA;VENTOLIN HFA) 108 (90 Base) MCG/ACT inhaler Inhale 2 puffs into the lungs every 6 (six) hours as needed for wheezing or shortness of breath (cough, shortness of breath or wheezing.). 1 Inhaler 3  . lisinopril (PRINIVIL,ZESTRIL) 5 MG tablet Take 1 tablet (5 mg total) by mouth daily. 90 tablet 3  . metoprolol succinate (TOPROL-XL) 50 MG 24 hr tablet Take 1 tablet (50 mg total) by mouth daily. 90 tablet 3  . pravastatin (PRAVACHOL) 40 MG tablet TAKE 1 TABLET ONCE DAILY. 90 tablet 3  . triamcinolone cream (KENALOG) 0.1 % Apply topically 2 (two) times daily. APPLY TWICE DAILY.  1:1 RATIO PLEASE 454 g 0  . furosemide (LASIX) 20 MG tablet Take 1 tablet (20 mg total) by mouth daily. (Patient not taking: Reported on 03/29/2017) 90 tablet 1    Home: [place] expects to be discharged to:: Unsure Additional Comments: pt lives alone and reports daughter lives down the street. 1 story home wiht 2 steps to enter with rail. Pt reports he would have to ask dtr if she could stay with him  Functional History: Prior Function Level of Independence: Independent Comments: no AD  for ambulation, very active Functional Status:  Mobility: Bed Mobility Overal bed mobility: Needs Assistance Bed Mobility: Supine to Sit Supine to sit: Mod assist, +2 for physical assistance General bed mobility comments: assist for L LE management and initial trunk flexion to long sit Transfers Overall  transfer level: Needs assistance Equipment used: Rolling walker (2 wheeled) Transfers: Sit to/from Stand Sit to Stand: Mod assist, +2 physical assistance General transfer comment: v/c's to push up from bed, modAx2 for initial power up, minA once in standing Ambulation/Gait Ambulation/Gait assistance: Min assist, +2 safety/equipment (chair follow) Ambulation Distance (Feet): 5 Feet Assistive device: Rolling walker (2 wheeled) Gait Pattern/deviations: Step-to pattern, Antalgic General Gait Details: pt able to maintain L LE PWB, limited by pain and endurance Gait velocity: slow Gait velocity interpretation: Below normal speed for age/gender    ADL:    Cognition: Cognition Overall Cognitive Status: Within Functional Limits for tasks assessed Orientation Level: Oriented X4 Cognition Arousal/Alertness: Awake/alert Behavior During Therapy: WFL for tasks assessed/performed Overall Cognitive Status: Within Functional Limits for tasks assessed  Blood pressure 106/62, pulse 71, temperature 98 F (36.7 C), temperature source Oral, resp. rate 16, SpO2 99 %. Physical Exam  Vitals reviewed. Constitutional: He is oriented to person, place, and time. He appears well-developed.  Frail  HENT:  Head: Normocephalic and atraumatic.  Eyes: EOM are normal. Right eye exhibits no discharge. Left eye exhibits no discharge.  Neck: Normal range of motion. Neck supple. No thyromegaly present.  Cardiovascular: Normal rate, regular rhythm and normal heart sounds.   Respiratory: Effort normal and breath sounds normal. No respiratory distress.  GI: Soft. Bowel sounds are normal. He exhibits no distension.  Musculoskeletal: He exhibits edema and tenderness.  Neurological: He is alert and oriented to person, place, and time.  Motor: B/l UE 4+/5 proximal to distal RLE: 4/5 proximally, 5/5 distally LLE: HF: 2/5, KE 2+/5, ADF/PF 4+/5 Sensation intact to light touch  Skin: Skin is warm and dry.  Hip incision  is dressed appropriately tender  Psychiatric: He has a normal mood and affect. His behavior is normal.    Results for orders placed or performed during the hospital encounter of 03/29/17 (from the past 24 hour(s))  CBC     Status: Abnormal   Collection Time: 03/31/17  4:55 AM  Result Value Ref Range   WBC 13.3 (H) 4.0 - 10.5 K/uL   RBC 3.22 (L) 4.22 - 5.81 MIL/uL   Hemoglobin 9.9 (L) 13.0 - 17.0 g/dL   HCT 30.2 (L) 39.0 - 52.0 %   MCV 93.8 78.0 - 100.0 fL   MCH 30.7 26.0 - 34.0 pg   MCHC 32.8 30.0 - 36.0 g/dL   RDW 12.4 11.5 - 15.5 %   Platelets 129 (L) 150 - 400 K/uL  Basic metabolic panel     Status: Abnormal   Collection Time: 03/31/17  4:55 AM  Result Value Ref Range   Sodium 137 135 - 145 mmol/L   Potassium 4.2 3.5 - 5.1 mmol/L   Chloride 105 101 - 111 mmol/L   CO2 26 22 - 32 mmol/L   Glucose, Bld 117 (H) 65 - 99 mg/dL   BUN 15 6 - 20 mg/dL   Creatinine, Ser 0.99 0.61 - 1.24 mg/dL   Calcium 8.3 (L) 8.9 - 10.3 mg/dL   GFR calc non Af Amer >60 >60 mL/min   GFR calc Af Amer >60 >60 mL/min   Anion gap 6 5 - 15  Protime-INR     Status:  Abnormal   Collection Time: 03/31/17  4:55 AM  Result Value Ref Range   Prothrombin Time 15.3 (H) 11.4 - 15.2 seconds   INR 1.20    Dg Chest 1 View  Result Date: 03/29/2017 CLINICAL DATA:  Preop evaluation, known left hip fracture EXAM: CHEST 1 VIEW COMPARISON:  03/19/2016 FINDINGS: Cardiac shadow is within normal limits. Elevation of the right hemidiaphragm is seen. A hiatal hernia is noted. No focal infiltrate or sizable effusion is noted. No bony abnormality is seen. IMPRESSION: No acute abnormality seen. Electronically Signed   By: Inez Catalina M.D.   On: 03/29/2017 16:38   Dg Ankle Complete Left  Result Date: 03/29/2017 CLINICAL DATA:  Fall, ankle pain EXAM: LEFT ANKLE COMPLETE - 3+ VIEW COMPARISON:  None. FINDINGS: No acute bony abnormality. Specifically, no fracture, subluxation, or dislocation. Soft tissues are intact. IMPRESSION: No  acute bony abnormality. Electronically Signed   By: Rolm Baptise M.D.   On: 03/29/2017 16:39   Pelvis Portable  Result Date: 03/30/2017 CLINICAL DATA:  Status post fixation of a left intertrochanteric fracture today. The patient suffered a fracture and a fall 03/29/2017. Initial encounter. EXAM: PORTABLE PELVIS 1-2 VIEWS COMPARISON:  Plain films left hip 03/29/2017. Intraoperative imaging today. FINDINGS: Long intramedullary nail with 2 hip screws is in place. Hardware is intact. There is improved position and alignment of the patient's intertrochanteric fracture. No acute abnormality. IMPRESSION: Status post fixation of a left intertrochanteric fracture. No acute finding. Electronically Signed   By: Inge Rise M.D.   On: 03/30/2017 14:57   Dg Knee Complete 4 Views Left  Result Date: 03/29/2017 CLINICAL DATA:  Fall.  Pain EXAM: LEFT KNEE - COMPLETE 4+ VIEW COMPARISON:  None. FINDINGS: No acute bony abnormality. Specifically, no fracture, subluxation, or dislocation. Soft tissues are intact. No joint effusion. IMPRESSION: No acute bony abnormality. Electronically Signed   By: Rolm Baptise M.D.   On: 03/29/2017 16:38   Dg C-arm 1-60 Min  Result Date: 03/30/2017 CLINICAL DATA:  ORIF left femur fracture. EXAM: DG C-ARM 61-120 MIN; LEFT FEMUR 2 VIEWS COMPARISON:  03/29/2017 FINDINGS: Four intraoperative spot films of the left femur are submitted postoperatively for interpretation. Placement of an intramedullary nail and screws noted traversing a intertrochanteric left femur fracture, in near-anatomic alignment and position. No definite complicating features noted. IMPRESSION: ORIF proximal left femur fracture, in near-anatomic alignment and position. Electronically Signed   By: Margarette Canada M.D.   On: 03/30/2017 13:05   Dg Hip Unilat With Pelvis 2-3 Views Left  Result Date: 03/29/2017 CLINICAL DATA:  Set fall, hip pain EXAM: DG HIP (WITH OR WITHOUT PELVIS) 2-3V LEFT COMPARISON:  None. FINDINGS: There is  a comminuted left femoral intertrochanteric fracture. Varus angulation. No subluxation or dislocation. IMPRESSION: Comminuted left femoral intertrochanteric fracture with varus angulation. Electronically Signed   By: Rolm Baptise M.D.   On: 03/29/2017 16:38   Dg Femur Min 2 Views Left  Result Date: 03/30/2017 CLINICAL DATA:  ORIF left femur fracture. EXAM: DG C-ARM 61-120 MIN; LEFT FEMUR 2 VIEWS COMPARISON:  03/29/2017 FINDINGS: Four intraoperative spot films of the left femur are submitted postoperatively for interpretation. Placement of an intramedullary nail and screws noted traversing a intertrochanteric left femur fracture, in near-anatomic alignment and position. No definite complicating features noted. IMPRESSION: ORIF proximal left femur fracture, in near-anatomic alignment and position. Electronically Signed   By: Margarette Canada M.D.   On: 03/30/2017 13:05    Assessment/Plan: Diagnosis:  Left intertrochanteric hip fracture  Labs independently reviewed.  Records reviewed and summated above.  1. Does the need for close, 24 hr/day medical supervision in concert with the patient's rehab needs make it unreasonable for this patient to be served in a less intensive setting? Yes 2. Co-Morbidities requiring supervision/potential complications: HTN (monitor and provide prns in accordance with increased physical exertion and pain), CKD stage II (avoid nephrotoxic meds), chronic leg edema, Acute blood loss anemia (transfuse if necessary to ensure appropriate perfusion for increased activity tolerance) 3. Due to safety, skin/wound care, disease management, pain management and patient education, does the patient require 24 hr/day rehab nursing? Yes 4. Does the patient require coordinated care of a physician, rehab nurse, PT (1-2 hrs/day, 5 days/week) and OT (1-2 hrs/day, 5 days/week) to address physical and functional deficits in the context of the above medical diagnosis(es)? Yes Addressing deficits in the  following areas: balance, endurance, locomotion, strength, transferring, bathing, dressing, toileting and psychosocial support 5. Can the patient actively participate in an intensive therapy program of at least 3 hrs of therapy per day at least 5 days per week? Yes 6. The potential for patient to make measurable gains while on inpatient rehab is excellent 7. Anticipated functional outcomes upon discharge from inpatient rehab are supervision  with PT, supervision and min assist with OT, n/a with SLP. 8. Estimated rehab length of stay to reach the above functional goals is: 12-16 days. 9. Anticipated D/C setting: Home 10. Anticipated post D/C treatments: HH therapy and Home excercise program 11. Overall Rehab/Functional Prognosis: good  RECOMMENDATIONS: This patient's condition is appropriate for continued rehabilitative care in the following setting: CIR Patient has agreed to participate in recommended program. Yes Note that insurance prior authorization may be required for reimbursement for recommended care.  Comment: Rehab Admissions Coordinator to follow up.  Delice Lesch, MD, Mellody Drown Cathlyn Parsons., PA-C 03/31/2017

## 2017-03-31 NOTE — Op Note (Signed)
NAME:  [name], [name]                       ACCOUNT NO.:  MEDICAL RECORD NO.:  [id number]  LOCATION:                                 FACILITY:  PHYSICIAN:  Anderson Malta, M.D.         DATE OF BIRTH:  DATE OF PROCEDURE: DATE OF DISCHARGE:                              OPERATIVE REPORT   PREOPERATIVE DIAGNOSIS:  Left hip intertrochanteric fracture.  POSTOPERATIVE DIAGNOSIS:  Left hip intertrochanteric fracture.  PROCEDURE:  Left hip intertrochanteric fracture open reduction and internal fixation using Smith and Nephew, IMHS 10 x 40 cm, 125-degree left TRIGEN INTERTAN nail with 105-mm lag screw, 100 mm compression screw.  SURGEON:  Anderson Malta, M.D.  ASSISTANT:  None.  ANESTHESIA:  General.  ESTIMATED BLOOD LOSS:  75 mL.  INDICATIONS:  [name] is a patient with left hip intertrochanteric fracture, presents for operative management after explanation of risks and benefits.  PROCEDURE IN DETAIL:  The patient was brought to the operating room where general anesthetic was induced.  Preoperative antibiotics administered.  Time-out was called.  The patient was placed on the fracture bed with the right leg in lithotomy position, left leg under traction and internal rotation.  Under fluoroscopic guidance, the fracture was reduced.  Following this, in accordance with preoperative templating, an incision was made 4 fingerbreadths proximal to the tip of the trochanter.  Skin and subcutaneous tissue were sharply divided.  It should be noted that Ioban was used with the wall drape to cover the operative field prior to starting the procedure.  Guidepin placed in the tip of the trochanter into the femur.  Correct location confirmed the AP and lateral planes under fluoroscopy.  Proximal reaming performed, 10 x 40 nail was placed through a separate incision, lag screw and compression screw were applied.  Traction released while compression was achieved.  Good reduction of the fracture was  achieved.  The screws were in the posterior-inferior portion of the femoral neck where the strongest bone was.  This gave excellent fracture fixation.  Thorough irrigation of both incisions was performed.  They were closed using 0 Vicryl suture, 2-0 Vicryl suture, and 3-0 nylon.  Aquacel dressings placed. The patient tolerated the procedure without immediate complication. Transferred to the recovery room in stable condition.     Anderson Malta, M.D.     GSD/MEDQ  D:  03/30/2017  T:  03/30/2017  Job:  [id number]

## 2017-03-31 NOTE — Progress Notes (Signed)
Subjective: Pt stable - pain ok   Objective: Vital signs in last 24 hours: Temp:  [97.5 F (36.4 C)-98.5 F (36.9 C)] 98 F (36.7 C) (06/04 0112) Pulse Rate:  [55-92] 71 (06/04 0112) Resp:  [16-23] 16 (06/04 0112) BP: (96-142)/(54-83) 106/62 (06/04 0112) SpO2:  [95 %-100 %] 99 % (06/04 0112)  Intake/Output from previous day: 06/03 0701 - 06/04 0700 In: 2235 [P.O.:240; I.V.:1895; IV Piggyback:100] Out: 325 [Urine:250; Blood:75] Intake/Output this shift: Total I/O In: 240 [P.O.:240] Out: 300 [Urine:300]  Exam:  Intact pulses distally Dorsiflexion/Plantar flexion intact  Labs:  Recent Labs  03/29/17 1648 03/30/17 0520 03/31/17 0455  HGB 12.4* 11.7* 9.9*    Recent Labs  03/30/17 0520 03/31/17 0455  WBC 10.6* 13.3*  RBC 3.80* 3.22*  HCT 35.8* 30.2*  PLT 160 129*    Recent Labs  03/30/17 0520 03/31/17 0455  NA 138 137  K 3.9 4.2  CL 102 105  CO2 26 26  BUN 16 15  CREATININE 1.15 0.99  GLUCOSE 128* 117*  CALCIUM 8.7* 8.3*    Recent Labs  03/29/17 1648 03/31/17 0455  INR 1.16 1.20    Assessment/Plan: Plan for dc planning - ok for 25 percent wb until first post op visit Pain med and dvt prophylaxis rx done   [employer] 03/31/2017, 11:30 AM

## 2017-03-31 NOTE — Care Management Note (Signed)
Case Management Note  Patient Details  Name: Mavrik Bynum MRN: [id number] Date of Birth: 11-07-1931  Subjective/Objective:  Mechanical fall, left intertrochanteric hip fracture s/p ORIF                  Action/Plan: Discharge Planning: Chart reviewed. CSW referral for possible SNF placement. CIR was recommended, waiting approval.   PCP Wardell Honour MD  Expected Discharge Date:                 Expected Discharge Plan:  [place]  In-House Referral:  Clinical Social Work  Discharge planning Services  CM Consult  Post Acute Care Choice:  NA Choice offered to:  NA  DME Arranged:  N/A DME Agency:  NA  HH Arranged:  NA HH Agency:  NA  Status of Service:  Completed, signed off  If discussed at [place] of Stay Meetings, dates discussed:    Additional Comments:  Erenest Rasher, RN 03/31/2017, 5:23 PM

## 2017-03-31 NOTE — Evaluation (Signed)
Occupational Therapy Evaluation Patient Details Name: Dayln Tugwell MRN: [id number] DOB: [birth year] Today's Date: 03/31/2017    History of Present Illness Izaan Hodgeis a 83 y.o.malewith medical history significant of HTN,HLD, Eczema, CKD stage 2, chronic leg edema, Hx of Bigeminy. Presented to the ED after a mechanical fall, noted to have L intertrochanteric Hip Fracture s/p ORIF.   Clinical Impression   PTA, pt lived alone and was independent; reports that his daughter lives nearby. Currently, pt requires Min A-Mod A for ADLs and Mod A for functional transfers. Pt highly motivated and willing to participate in therapy. Pt recalled WB precautions with Min prompting at beginning of session. Pt would benefit from acute OT to facilitate safe dc and increased his safety and independence with ADLs. Recommend dc to CIR for intensive therapy to optimize his safety, adherence to WB precautions, and independence prior to transitioning home.    Follow Up Recommendations  CIR;Supervision/Assistance - 24 hour    Equipment Recommendations  Other (comment) (Defer to next venue)    Recommendations for Other Services Rehab consult;PT consult     Precautions / Restrictions Precautions Precautions: Fall Restrictions Weight Bearing Restrictions: Yes LLE Weight Bearing: Partial weight bearing LLE Partial Weight Bearing Percentage or Pounds: 25      Mobility Bed Mobility Overal bed mobility: Needs Assistance Bed Mobility: Supine to Sit     Supine to sit: Mod assist;+2 for physical assistance     General bed mobility comments: In recliner upon arrival and requested to stay in recliner at end of session  Transfers Overall transfer level: Needs assistance Equipment used: Rolling walker (2 wheeled) Transfers: Sit to/from Stand Sit to Stand: Mod assist         General transfer comment: v/c's to push up from bed, modA for initial power up, minA once in standing    Balance Overall balance  assessment: Needs assistance Sitting-balance support: Feet supported;Bilateral upper extremity supported Sitting balance-Leahy Scale: Fair     Standing balance support: Bilateral upper extremity supported Standing balance-Leahy Scale: Poor Standing balance comment: requires RW                           ADL either performed or assessed with clinical judgement   ADL Overall ADL's : Needs assistance/impaired Eating/Feeding: Set up;Sitting   Grooming: Oral care;Set up;Sitting   Upper Body Bathing: Set up;Sitting   Lower Body Bathing: Minimal assistance;Sit to/from stand   Upper Body Dressing : Set up;Sitting   Lower Body Dressing: Minimal assistance;Sit to/from stand Lower Body Dressing Details (indicate cue type and reason): Adjusted socks and demonstrated good ROM and motivation Toilet Transfer: RW;Moderate assistance (Simulated to recliner)           Functional mobility during ADLs: Rolling walker;Moderate assistance;Cueing for sequencing;Cueing for safety General ADL Comments: Pt highly motivated and willing to work with OT.      Vision         Perception     Praxis      Pertinent Vitals/Pain Faces Pain Scale: Hurts little more Pain Location: L hip Pain Descriptors / Indicators: Sore     Hand Dominance Right   Extremity/Trunk Assessment Upper Extremity Assessment Upper Extremity Assessment: Overall WFL for tasks assessed   Lower Extremity Assessment Lower Extremity Assessment: Defer to PT evaluation LLE Deficits / Details: pt able to move ankle/wiggle toes, initiate quad set but no active hip/knee flexion   Cervical / Trunk Assessment Cervical / Trunk Assessment: Normal  Communication Communication Communication: HOH   Cognition Arousal/Alertness: Awake/alert Behavior During Therapy: WFL for tasks assessed/performed Overall Cognitive Status: Within Functional Limits for tasks assessed                                      General Comments  Pt recalled WB precautions at begining of session    Exercises     Shoulder Instructions      Home Living Family/patient expects to be discharged to:: Unsure Living Arrangements: Alone Available Help at Discharge: Family;Available PRN/intermittently (Daughter can come stay PRN) Type of Home: House Home Access: Stairs to enter [employer] of Steps: 2                       Additional Comments: pt lives alone and reports daughter lives down the street. 1 story home wiht 2 steps to enter with rail. Pt reports he would have to ask dtr if she could stay with him      Prior Functioning/Environment Level of Independence: Independent        Comments: ADLs, IADLs, and very active        OT Problem List: Decreased strength;Decreased range of motion;Decreased activity tolerance;Impaired balance (sitting and/or standing);Decreased safety awareness;Decreased knowledge of use of DME or AE;Decreased knowledge of precautions;Pain      OT Treatment/Interventions: Self-care/ADL training;Therapeutic exercise;Energy conservation;DME and/or AE instruction;Therapeutic activities;Patient/family education    OT Goals(Current goals can be found in the care plan section) Acute Rehab OT Goals Patient Stated Goal: Get stronger and walk OT Goal Formulation: With patient Time For Goal Achievement: 04/14/17 Potential to Achieve Goals: Good ADL Goals Pt Will Perform Grooming: with min assist;standing Pt Will Perform Upper Body Dressing: with supervision;with set-up;sitting Pt Will Perform Lower Body Dressing: with min guard assist;sit to/from stand Pt Will Transfer to Toilet: with min assist;ambulating;bedside commode Pt Will Perform Toileting - Clothing Manipulation and hygiene: with min assist;sit to/from stand  OT Frequency: Min 2X/week   Barriers to D/C:            Co-evaluation              AM-PAC PT "6 Clicks" Daily Activity     Outcome  Measure Help from another person eating meals?: None Help from another person taking care of personal grooming?: A Little Help from another person toileting, which includes using toliet, bedpan, or urinal?: A Lot Help from another person bathing (including washing, rinsing, drying)?: A Lot Help from another person to put on and taking off regular upper body clothing?: A Little Help from another person to put on and taking off regular lower body clothing?: A Little 6 Click Score: 17   End of Session Equipment Utilized During Treatment: Gait belt;Rolling walker Nurse Communication: Mobility status;Weight bearing status  Activity Tolerance: Patient tolerated treatment well Patient left: in chair;with call bell/phone within reach;with bed alarm set;with family/visitor present  OT Visit Diagnosis: Unsteadiness on feet (R26.81);Other abnormalities of gait and mobility (R26.89);Muscle weakness (generalized) (M62.81);Pain Pain - Right/Left: Left Pain - part of body: Leg                Time: 9833-8250 OT Time Calculation (min): 24 min Charges:  OT General Charges $OT Visit: 1 Procedure OT Evaluation $OT Eval Low Complexity: 1 Procedure OT Treatments $Self Care/Home Management : 8-22 mins G-Codes:     [employer], OTR/L [phone]  Bralee Feldt  M Baldo Hufnagle 03/31/2017, 4:12 PM

## 2017-03-31 NOTE — Progress Notes (Signed)
PROGRESS NOTE    [name]  SMO:[id number] DOB: [birth year] DOA: 03/29/2017 PCP: Wardell Honour, MD  Brief Narrative:[name] is a 83 y.o. male with medical history significant of HTN,HLD, Eczema, CKD stage 2, chronic leg edema,  Hx of Bigeminy. Presented to the ED after a mechanical fall, noted to have a Hip Fracture  Assessment & Plan:  . Closed left femoral fracture ([place])  -Orthopedics consulting, s/p IM Nail 6/3 per Dr.Dean -started on Xarelto per Dr.Dean for DVT prophylaxis -PT  -DC planning, CSW consult  ACute post op Blood loss anemia -Hb drop 2grams, monitor     HTN -Stable, continue metoprolol, holding lisinopril  . Dyslipidemia  - stable continue home medications  . Leukocytosis - reactive, monitor, afebrile  H/o CKD 2 -creatinine in normal range, monitor  DVT prophylaxis: add lovenox after surgery Code Status: DNR Family Communication:none at bedsode Disposition Plan: will need SNF  Consultants:   Ortho Dr.Dean   Subjective: Feels ok, some pain in L ankle  Objective: Vitals:   03/30/17 1445 03/30/17 1512 03/30/17 2206 03/31/17 0112  BP: 135/83 (!) 116/55 (!) 96/54 106/62  Pulse: 92 (!) 59 (!) 55 71  Resp: (!) 22  16 16   Temp:  97.5 F (36.4 C) 98.5 F (36.9 C) 98 F (36.7 C)  TempSrc:  Oral Oral Oral  SpO2: 100% 99% 98% 99%    Intake/Output Summary (Last 24 hours) at 03/31/17 1145 Last data filed at 03/31/17 0750  Gross per 24 hour  Intake             2475 ml  Output              625 ml  Net             1850 ml   There were no vitals filed for this visit.  Examination:  General exam: AAOx3, no distress Respiratory system: CTAB, good air movement Cardiovascular system: S1 & S2 heard, RRR.  Gastrointestinal system: Abdomen is nondistended, soft and nontender. Central nervous system: Alert and oriented. No focal neurological deficits. Extremities: L hip with dressing Skin: No rashes, lesions or ulcers Psychiatry: Judgement and  insight appear normal. Mood & affect appropriate.     Data Reviewed:   CBC:  Recent Labs Lab 03/29/17 1648 03/30/17 0520 03/31/17 0455  WBC 14.4* 10.6* 13.3*  NEUTROABS 11.8*  --   --   HGB 12.4* 11.7* 9.9*  HCT 36.2* 35.8* 30.2*  MCV 93.1 94.2 93.8  PLT 156 160 920*   Basic Metabolic Panel:  Recent Labs Lab 03/29/17 1648 03/30/17 0520 03/31/17 0455  NA 142 138 137  K 4.0 3.9 4.2  CL 107 102 105  CO2 26 26 26   GLUCOSE 154* 128* 117*  BUN 20 16 15   CREATININE 1.15 1.15 0.99  CALCIUM 8.6* 8.7* 8.3*   GFR: CrCl cannot be calculated (Unknown ideal weight.). Liver Function Tests:  Recent Labs Lab 03/29/17 1648 03/30/17 0520  AST 32  --   ALT 16*  --   ALKPHOS 69  --   BILITOT 1.1  --   PROT 6.2*  --   ALBUMIN 3.4* 2.9*   No results for input(s): LIPASE, AMYLASE in the last 168 hours. No results for input(s): AMMONIA in the last 168 hours. Coagulation Profile:  Recent Labs Lab 03/29/17 1648 03/31/17 0455  INR 1.16 1.20   Cardiac Enzymes:  Recent Labs Lab 03/29/17 1737  CKTOTAL 565*   BNP (last 3 results) No results  for input(s): PROBNP in the last 8760 hours. HbA1C: No results for input(s): HGBA1C in the last 72 hours. CBG: No results for input(s): GLUCAP in the last 168 hours. Lipid Profile: No results for input(s): CHOL, HDL, LDLCALC, TRIG, CHOLHDL, LDLDIRECT in the last 72 hours. Thyroid Function Tests: No results for input(s): TSH, T4TOTAL, FREET4, T3FREE, THYROIDAB in the last 72 hours. Anemia Panel: No results for input(s): VITAMINB12, FOLATE, FERRITIN, TIBC, IRON, RETICCTPCT in the last 72 hours. Urine analysis:    Component Value Date/Time   COLORURINE YELLOW 03/29/2017 1648   APPEARANCEUR CLEAR 03/29/2017 1648   LABSPEC 1.020 03/29/2017 1648   PHURINE 5.0 03/29/2017 1648   GLUCOSEU NEGATIVE 03/29/2017 1648   HGBUR NEGATIVE 03/29/2017 1648   BILIRUBINUR NEGATIVE 03/29/2017 1648   BILIRUBINUR negative 09/11/2016 1051    BILIRUBINUR neg 06/03/2014 1014   KETONESUR NEGATIVE 03/29/2017 1648   PROTEINUR NEGATIVE 03/29/2017 1648   UROBILINOGEN 1.0 09/11/2016 1051   NITRITE NEGATIVE 03/29/2017 1648   LEUKOCYTESUR NEGATIVE 03/29/2017 1648   Sepsis Labs: @LABRCNTIP (procalcitonin:4,lacticidven:4)  ) Recent Results (from the past 240 hour(s))  MRSA PCR Screening     Status: None   Collection Time: 03/29/17 11:25 PM  Result Value Ref Range Status   MRSA by PCR NEGATIVE NEGATIVE Final    Comment:        The GeneXpert MRSA Assay (FDA approved for NASAL specimens only), is one component of a comprehensive MRSA colonization surveillance program. It is not intended to diagnose MRSA infection nor to guide or monitor treatment for MRSA infections.          Radiology Studies: Dg Chest 1 View  Result Date: 03/29/2017 CLINICAL DATA:  Preop evaluation, known left hip fracture EXAM: CHEST 1 VIEW COMPARISON:  03/19/2016 FINDINGS: Cardiac shadow is within normal limits. Elevation of the right hemidiaphragm is seen. A hiatal hernia is noted. No focal infiltrate or sizable effusion is noted. No bony abnormality is seen. IMPRESSION: No acute abnormality seen. Electronically Signed   By: Inez Catalina M.D.   On: 03/29/2017 16:38   Dg Ankle Complete Left  Result Date: 03/29/2017 CLINICAL DATA:  Fall, ankle pain EXAM: LEFT ANKLE COMPLETE - 3+ VIEW COMPARISON:  None. FINDINGS: No acute bony abnormality. Specifically, no fracture, subluxation, or dislocation. Soft tissues are intact. IMPRESSION: No acute bony abnormality. Electronically Signed   By: Rolm Baptise M.D.   On: 03/29/2017 16:39   Pelvis Portable  Result Date: 03/30/2017 CLINICAL DATA:  Status post fixation of a left intertrochanteric fracture today. The patient suffered a fracture and a fall 03/29/2017. Initial encounter. EXAM: PORTABLE PELVIS 1-2 VIEWS COMPARISON:  Plain films left hip 03/29/2017. Intraoperative imaging today. FINDINGS: Long intramedullary nail  with 2 hip screws is in place. Hardware is intact. There is improved position and alignment of the patient's intertrochanteric fracture. No acute abnormality. IMPRESSION: Status post fixation of a left intertrochanteric fracture. No acute finding. Electronically Signed   By: Inge Rise M.D.   On: 03/30/2017 14:57   Dg Knee Complete 4 Views Left  Result Date: 03/29/2017 CLINICAL DATA:  Fall.  Pain EXAM: LEFT KNEE - COMPLETE 4+ VIEW COMPARISON:  None. FINDINGS: No acute bony abnormality. Specifically, no fracture, subluxation, or dislocation. Soft tissues are intact. No joint effusion. IMPRESSION: No acute bony abnormality. Electronically Signed   By: Rolm Baptise M.D.   On: 03/29/2017 16:38   Dg C-arm 1-60 Min  Result Date: 03/30/2017 CLINICAL DATA:  ORIF left femur fracture. EXAM: DG C-ARM 61-120  MIN; LEFT FEMUR 2 VIEWS COMPARISON:  03/29/2017 FINDINGS: Four intraoperative spot films of the left femur are submitted postoperatively for interpretation. Placement of an intramedullary nail and screws noted traversing a intertrochanteric left femur fracture, in near-anatomic alignment and position. No definite complicating features noted. IMPRESSION: ORIF proximal left femur fracture, in near-anatomic alignment and position. Electronically Signed   By: Margarette Canada M.D.   On: 03/30/2017 13:05   Dg Hip Unilat With Pelvis 2-3 Views Left  Result Date: 03/29/2017 CLINICAL DATA:  Set fall, hip pain EXAM: DG HIP (WITH OR WITHOUT PELVIS) 2-3V LEFT COMPARISON:  None. FINDINGS: There is a comminuted left femoral intertrochanteric fracture. Varus angulation. No subluxation or dislocation. IMPRESSION: Comminuted left femoral intertrochanteric fracture with varus angulation. Electronically Signed   By: Rolm Baptise M.D.   On: 03/29/2017 16:38   Dg Femur Min 2 Views Left  Result Date: 03/30/2017 CLINICAL DATA:  ORIF left femur fracture. EXAM: DG C-ARM 61-120 MIN; LEFT FEMUR 2 VIEWS COMPARISON:  03/29/2017 FINDINGS:  Four intraoperative spot films of the left femur are submitted postoperatively for interpretation. Placement of an intramedullary nail and screws noted traversing a intertrochanteric left femur fracture, in near-anatomic alignment and position. No definite complicating features noted. IMPRESSION: ORIF proximal left femur fracture, in near-anatomic alignment and position. Electronically Signed   By: Margarette Canada M.D.   On: 03/30/2017 13:05        Scheduled Meds: . metoprolol succinate  50 mg Oral Daily  . pravastatin  40 mg Oral Daily  . rivaroxaban  10 mg Oral Daily  . triamcinolone cream   Topical BID   Continuous Infusions: . methocarbamol (ROBAXIN)  IV       LOS: 2 days    Time spent: 59min    Domenic Polite, MD [facility] Pager [phone]  If 7PM-7AM, please contact night-coverage [email or web address] Password [email or web address] 03/31/2017, 11:45 AM

## 2017-03-31 NOTE — Progress Notes (Signed)
Rehab Admissions Coordinator Note:  Patient was screened by Retta Diones for appropriateness for an Inpatient Acute Rehab Consult.  At this time, we are recommending Inpatient Rehab consult.  Retta Diones 03/31/2017, 2:36 PM  I can be reached at [phone].

## 2017-03-31 NOTE — Evaluation (Signed)
Physical Therapy Evaluation Patient Details Name: [name] MRN: [id number] DOB: 31-Jul-1932 Today's Date: 03/31/2017   History of Present Illness  Wesley Hodgeis a 83 y.o.malewith medical history significant of HTN,HLD, Eczema, CKD stage 2, chronic leg edema, Hx of Bigeminy. Presented to the ED after a mechanical fall, noted to have L intertrochanteric Hip Fracture s/p ORIF.  Clinical Impression  Pt admitted with above. Pt indep and living alone PTA with supportive daughter living down the street. Pt very active and motivated to return to indep. Pt now L LE 25% PWB. Pt to benefit from CIR upon d/c to achieve safe mod I level of function for safe transition home. Pt currently requiring assist for all mobility and L LE management. Acute PT to to con't to follow.    Follow Up Recommendations CIR    Equipment Recommendations  Rolling walker with 5" wheels;3in1 (PT)    Recommendations for Other Services Rehab consult     Precautions / Restrictions Precautions Precautions: Fall Restrictions Weight Bearing Restrictions: Yes LLE Weight Bearing: Partial weight bearing LLE Partial Weight Bearing Percentage or Pounds: 25      Mobility  Bed Mobility Overal bed mobility: Needs Assistance Bed Mobility: Supine to Sit     Supine to sit: Mod assist;+2 for physical assistance     General bed mobility comments: assist for L LE management and initial trunk flexion to long sit  Transfers Overall transfer level: Needs assistance Equipment used: Rolling walker (2 wheeled) Transfers: Sit to/from Stand Sit to Stand: Mod assist;+2 physical assistance         General transfer comment: v/c's to push up from bed, modAx2 for initial power up, minA once in standing  Ambulation/Gait Ambulation/Gait assistance: Min assist;+2 safety/equipment (chair follow) Ambulation Distance (Feet): 5 Feet Assistive device: Rolling walker (2 wheeled) Gait Pattern/deviations: Step-to pattern;Antalgic Gait  velocity: slow Gait velocity interpretation: Below normal speed for age/gender General Gait Details: pt able to maintain L LE PWB, limited by pain and endurance  Stairs            Wheelchair Mobility    Modified Rankin (Stroke Patients Only)       Balance Overall balance assessment: Needs assistance Sitting-balance support: Feet supported;Bilateral upper extremity supported Sitting balance-Leahy Scale: Fair     Standing balance support: Bilateral upper extremity supported Standing balance-Leahy Scale: Poor Standing balance comment: requires RW                             Pertinent Vitals/Pain Pain Assessment: 0-10 Pain Score: 5  Pain Location: L hip Pain Descriptors / Indicators: Sore Pain Intervention(s): Monitored during session    Home Living Family/patient expects to be discharged to:: Unsure                 Additional Comments: pt lives alone and reports daughter lives down the street. 1 story home wiht 2 steps to enter with rail. Pt reports he would have to ask dtr if she could stay with him    Prior Function Level of Independence: Independent         Comments: no AD for ambulation, very active     Hand Dominance   Dominant Hand: Right    Extremity/Trunk Assessment   Upper Extremity Assessment Upper Extremity Assessment: Overall WFL for tasks assessed    Lower Extremity Assessment Lower Extremity Assessment: LLE deficits/detail LLE Deficits / Details: pt able to move ankle/wiggle toes, initiate quad set but  no active hip/knee flexion    Cervical / Trunk Assessment Cervical / Trunk Assessment: Normal  Communication   Communication: HOH  Cognition Arousal/Alertness: Awake/alert Behavior During Therapy: WFL for tasks assessed/performed Overall Cognitive Status: Within Functional Limits for tasks assessed                                        General Comments General comments (skin integrity, edema,  etc.): dressing intact at L hip    Exercises General Exercises - Lower Extremity Ankle Circles/Pumps: AROM;Both;10 reps;Supine Quad Sets: AROM;Left;10 reps;Supine Gluteal Sets: AROM;10 reps;Both;Supine   Assessment/Plan    PT Assessment Patient needs continued PT services  PT Problem List Decreased strength;Decreased range of motion;Decreased activity tolerance;Decreased balance;Decreased mobility;Decreased knowledge of use of DME;Pain       PT Treatment Interventions DME instruction;Gait training;Stair training;Functional mobility training;Therapeutic activities;Therapeutic exercise;Balance training    PT Goals (Current goals can be found in the Care Plan section)  Acute Rehab PT Goals Patient Stated Goal: home PT Goal Formulation: With patient Time For Goal Achievement: 04/07/17 Potential to Achieve Goals: Fair    Frequency Min 5X/week   Barriers to discharge Decreased caregiver support lives alone but dtr near by    [employer]               AM-PAC PT "6 Clicks" Daily Activity  Outcome Measure Difficulty turning over in bed (including adjusting bedclothes, sheets and blankets)?: A Lot Difficulty moving from lying on back to sitting on the side of the bed? : A Lot Difficulty sitting down on and standing up from a chair with arms (e.g., wheelchair, bedside commode, etc,.)?: A Lot Help needed moving to and from a bed to chair (including a wheelchair)?: A Little Help needed walking in hospital room?: A Little Help needed climbing 3-5 steps with a railing? : A Lot 6 Click Score: 14    End of Session Equipment Utilized During Treatment: Gait belt Activity Tolerance: Patient tolerated treatment well Patient left: in chair;with call bell/phone within reach;with chair alarm set Nurse Communication: Mobility status PT Visit Diagnosis: Muscle weakness (generalized) (M62.81);Pain;Difficulty in walking, not elsewhere classified (R26.2) Pain - Right/Left: Left Pain -  part of body: Hip    Time: 2423-5361 PT Time Calculation (min) (ACUTE ONLY): 20 min   Charges:   PT Evaluation $PT Eval Moderate Complexity: 1 Procedure     PT G CodesKittie Plater, PT, DPT Pager #: [phone] Office #: [phone]   [place] 03/31/2017, 1:24 PM

## 2017-04-01 ENCOUNTER — Encounter (HOSPITAL_COMMUNITY): Payer: Self-pay

## 2017-04-01 ENCOUNTER — Inpatient Hospital Stay (HOSPITAL_COMMUNITY)
Admission: RE | Admit: 2017-04-01 | Discharge: 2017-04-15 | DRG: 560 | Disposition: A | Discharge status: Skilled Nursing Facility | Payer: Medicare Other | Source: Intra-hospital | Attending: Physical Medicine & Rehabilitation | Admitting: Physical Medicine & Rehabilitation

## 2017-04-01 DIAGNOSIS — I129 Hypertensive chronic kidney disease with stage 1 through stage 4 chronic kidney disease, or unspecified chronic kidney disease: Secondary | ICD-10-CM | POA: Diagnosis present

## 2017-04-01 DIAGNOSIS — S72142D Displaced intertrochanteric fracture of left femur, subsequent encounter for closed fracture with routine healing: Secondary | ICD-10-CM | POA: Diagnosis not present

## 2017-04-01 DIAGNOSIS — E46 Unspecified protein-calorie malnutrition: Secondary | ICD-10-CM

## 2017-04-01 DIAGNOSIS — I10 Essential (primary) hypertension: Secondary | ICD-10-CM

## 2017-04-01 DIAGNOSIS — R269 Unspecified abnormalities of gait and mobility: Secondary | ICD-10-CM | POA: Diagnosis not present

## 2017-04-01 DIAGNOSIS — M7989 Other specified soft tissue disorders: Secondary | ICD-10-CM | POA: Diagnosis not present

## 2017-04-01 DIAGNOSIS — Z9842 Cataract extraction status, left eye: Secondary | ICD-10-CM

## 2017-04-01 DIAGNOSIS — R159 Full incontinence of feces: Secondary | ICD-10-CM

## 2017-04-01 DIAGNOSIS — R7989 Other specified abnormal findings of blood chemistry: Secondary | ICD-10-CM | POA: Diagnosis not present

## 2017-04-01 DIAGNOSIS — R600 Localized edema: Secondary | ICD-10-CM | POA: Diagnosis present

## 2017-04-01 DIAGNOSIS — N3944 Nocturnal enuresis: Secondary | ICD-10-CM

## 2017-04-01 DIAGNOSIS — S72009A Fracture of unspecified part of neck of unspecified femur, initial encounter for closed fracture: Secondary | ICD-10-CM | POA: Diagnosis not present

## 2017-04-01 DIAGNOSIS — Z9181 History of falling: Secondary | ICD-10-CM | POA: Diagnosis not present

## 2017-04-01 DIAGNOSIS — S72142A Displaced intertrochanteric fracture of left femur, initial encounter for closed fracture: Secondary | ICD-10-CM | POA: Diagnosis present

## 2017-04-01 DIAGNOSIS — W19XXXA Unspecified fall, initial encounter: Secondary | ICD-10-CM | POA: Diagnosis not present

## 2017-04-01 DIAGNOSIS — Z9841 Cataract extraction status, right eye: Secondary | ICD-10-CM | POA: Diagnosis not present

## 2017-04-01 DIAGNOSIS — R278 Other lack of coordination: Secondary | ICD-10-CM | POA: Diagnosis not present

## 2017-04-01 DIAGNOSIS — Z79899 Other long term (current) drug therapy: Secondary | ICD-10-CM

## 2017-04-01 DIAGNOSIS — R52 Pain, unspecified: Secondary | ICD-10-CM | POA: Diagnosis not present

## 2017-04-01 DIAGNOSIS — G8918 Other acute postprocedural pain: Secondary | ICD-10-CM | POA: Diagnosis not present

## 2017-04-01 DIAGNOSIS — R2689 Other abnormalities of gait and mobility: Secondary | ICD-10-CM | POA: Diagnosis not present

## 2017-04-01 DIAGNOSIS — N182 Chronic kidney disease, stage 2 (mild): Secondary | ICD-10-CM | POA: Diagnosis present

## 2017-04-01 DIAGNOSIS — W19XXXD Unspecified fall, subsequent encounter: Secondary | ICD-10-CM | POA: Diagnosis not present

## 2017-04-01 DIAGNOSIS — E784 Other hyperlipidemia: Secondary | ICD-10-CM | POA: Diagnosis not present

## 2017-04-01 DIAGNOSIS — M6281 Muscle weakness (generalized): Secondary | ICD-10-CM | POA: Diagnosis not present

## 2017-04-01 DIAGNOSIS — D62 Acute posthemorrhagic anemia: Secondary | ICD-10-CM | POA: Diagnosis present

## 2017-04-01 DIAGNOSIS — K5900 Constipation, unspecified: Secondary | ICD-10-CM | POA: Diagnosis present

## 2017-04-01 DIAGNOSIS — S72142S Displaced intertrochanteric fracture of left femur, sequela: Secondary | ICD-10-CM | POA: Diagnosis not present

## 2017-04-01 DIAGNOSIS — E8809 Other disorders of plasma-protein metabolism, not elsewhere classified: Secondary | ICD-10-CM

## 2017-04-01 DIAGNOSIS — S72145S Nondisplaced intertrochanteric fracture of left femur, sequela: Secondary | ICD-10-CM | POA: Diagnosis not present

## 2017-04-01 DIAGNOSIS — E785 Hyperlipidemia, unspecified: Secondary | ICD-10-CM | POA: Diagnosis present

## 2017-04-01 DIAGNOSIS — Z8249 Family history of ischemic heart disease and other diseases of the circulatory system: Secondary | ICD-10-CM

## 2017-04-01 DIAGNOSIS — S8990XA Unspecified injury of unspecified lower leg, initial encounter: Secondary | ICD-10-CM | POA: Diagnosis not present

## 2017-04-01 DIAGNOSIS — Z4789 Encounter for other orthopedic aftercare: Secondary | ICD-10-CM | POA: Diagnosis not present

## 2017-04-01 DIAGNOSIS — R799 Abnormal finding of blood chemistry, unspecified: Secondary | ICD-10-CM

## 2017-04-01 DIAGNOSIS — S72145A Nondisplaced intertrochanteric fracture of left femur, initial encounter for closed fracture: Secondary | ICD-10-CM | POA: Diagnosis not present

## 2017-04-01 DIAGNOSIS — W19XXXS Unspecified fall, sequela: Secondary | ICD-10-CM | POA: Diagnosis not present

## 2017-04-01 DIAGNOSIS — R000 Tachycardia, unspecified: Secondary | ICD-10-CM

## 2017-04-01 LAB — BASIC METABOLIC PANEL
Anion gap: 8 (ref 5–15)
BUN: 16 mg/dL (ref 6–20)
CALCIUM: 8.3 mg/dL — AB (ref 8.9–10.3)
CHLORIDE: 100 mmol/L — AB (ref 101–111)
CO2: 29 mmol/L (ref 22–32)
CREATININE: 1.19 mg/dL (ref 0.61–1.24)
GFR, EST NON AFRICAN AMERICAN: 55 mL/min — AB (ref >60)
Glucose, Bld: 93 mg/dL (ref 65–99)
Potassium: 4 mmol/L (ref 3.5–5.1)
SODIUM: 137 mmol/L (ref 135–145)

## 2017-04-01 LAB — CBC
HCT: 27.4 % — ABNORMAL LOW (ref 39.0–52.0)
HEMOGLOBIN: 9.1 g/dL — AB (ref 13.0–17.0)
MCH: 31.1 pg (ref 26.0–34.0)
MCHC: 33.2 g/dL (ref 30.0–36.0)
MCV: 93.5 fL (ref 78.0–100.0)
PLATELETS: 129 10*3/uL — AB (ref 150–400)
RBC: 2.93 MIL/uL — ABNORMAL LOW (ref 4.22–5.81)
RDW: 12.6 % (ref 11.5–15.5)
WBC: 9.1 10*3/uL (ref 4.0–10.5)

## 2017-04-01 LAB — PROTIME-INR
INR: 1.3
PROTHROMBIN TIME: 16.3 s — AB (ref 11.4–15.2)

## 2017-04-01 MED ORDER — ONDANSETRON HCL 4 MG/2ML IJ SOLN
4.0000 mg | Freq: Four times a day (QID) | INTRAMUSCULAR | Status: DC | PRN
Start: 2017-04-01 — End: 2017-04-15

## 2017-04-01 MED ORDER — METHOCARBAMOL 1000 MG/10ML IJ SOLN: 500.0000 mg | Freq: Four times a day (QID) | INTRAVENOUS | Status: DC | PRN

## 2017-04-01 MED ORDER — RIVAROXABAN 10 MG PO TABS
10.0000 mg | ORAL_TABLET | Freq: Every day | ORAL | Status: DC
  Administered 2017-04-02 – 2017-04-15 (×14): 10 mg via ORAL
  Filled 2017-04-01 (×15): qty 1

## 2017-04-01 MED ORDER — METHOCARBAMOL 500 MG PO TABS
500.0000 mg | ORAL_TABLET | Freq: Four times a day (QID) | ORAL | Status: DC | PRN
  Filled 2017-04-01: qty 1

## 2017-04-01 MED ORDER — HYDROCODONE-ACETAMINOPHEN 5-325 MG PO TABS
1.0000 | ORAL_TABLET | Freq: Four times a day (QID) | ORAL | Status: DC | PRN
  Administered 2017-04-03 – 2017-04-09 (×11): 1 via ORAL
  Administered 2017-04-09 – 2017-04-11 (×3): 2 via ORAL
  Filled 2017-04-01 (×4): qty 1
  Filled 2017-04-01: qty 2
  Filled 2017-04-01 (×2): qty 1
  Filled 2017-04-01: qty 2
  Filled 2017-04-01 (×2): qty 1
  Filled 2017-04-01: qty 2
  Filled 2017-04-01 (×3): qty 1

## 2017-04-01 MED ORDER — METOPROLOL SUCCINATE ER 50 MG PO TB24
50.0000 mg | ORAL_TABLET | Freq: Every day | ORAL | Status: DC
  Administered 2017-04-02 – 2017-04-15 (×12): 50 mg via ORAL
  Filled 2017-04-01 (×14): qty 1

## 2017-04-01 MED ORDER — ONDANSETRON HCL 4 MG PO TABS: 4.0000 mg | ORAL_TABLET | Freq: Four times a day (QID) | ORAL | Status: DC | PRN

## 2017-04-01 MED ORDER — PRAVASTATIN SODIUM 20 MG PO TABS
40.0000 mg | ORAL_TABLET | Freq: Every day | ORAL | Status: DC
  Administered 2017-04-02 – 2017-04-15 (×14): 40 mg via ORAL
  Filled 2017-04-01 (×14): qty 2

## 2017-04-01 MED ORDER — SORBITOL 70 % SOLN
30.0000 mL | Freq: Every day | Status: DC | PRN
  Administered 2017-04-01 – 2017-04-11 (×4): 30 mL via ORAL
  Filled 2017-04-01 (×4): qty 30

## 2017-04-01 MED ORDER — TRIAMCINOLONE ACETONIDE 0.1 % EX CREA
TOPICAL_CREAM | Freq: Two times a day (BID) | CUTANEOUS | Status: DC
  Administered 2017-04-02 – 2017-04-05 (×8): via TOPICAL
  Administered 2017-04-06: 1 via TOPICAL
  Administered 2017-04-06 – 2017-04-15 (×14): via TOPICAL
  Filled 2017-04-01 (×2): qty 15

## 2017-04-01 NOTE — Progress Notes (Signed)
Patient being transported to CIR. RN gave report on patient to CIR RN.

## 2017-04-01 NOTE — PMR Pre-admission (Signed)
PMR Admission Coordinator Pre-Admission Assessment  Patient: [name] is an 83 y.o., male MRN: [id number] DOB: 03/22/1932 Height: 5' 7.5" (171.5 cm) Weight: 67.1 kg (148 lb)              Insurance Information HMO: No    PPO:       PCP:       IPA:       80/20:       OTHER:   PRIMARY:  Medicare A/B      Policy#:  [id number]      Subscriber: [name] CM Name:        Phone#:       Fax#:   Pre-Cert#:        Employer: Retired Benefits:  Phone #:       Name: Checked in Financial planner. Date: A=10/28/98 and B=04/28/03     Deduct:  $1340      Out of Pocket Max: none      Life Max: unlimited CIR: 100%      SNF: 100 days Outpatient: 80%     Co-Pay: 20% Home Health: 100%      Co-Pay: none DME: 80%     Co-Pay: 20% Providers: patient's choice  Medicaid Application Date:        Case Manager:   Disability Application Date:        Case Worker:    Emergency Contact Information Contact Information    Name Relation Home Work Mobile   Cumming,[name]  [phone]  [phone]     Current Medical History  Patient Admitting Diagnosis:  L IT hip fracture  History of Present Illness:  An 83 y.o. right handed male with history of hypertension, CKD stage II and chronic leg edema. Per chart review and patient, patient independent living alone prior to admission. He has a supportive daughter lives close by. 1 level home with 2 steps to entry. Presented 03/29/2017 after a fall of which he was down for extended time. No loss of consciousness. X-rays and imaging revealed left intertrochanteric hip fracture. Underwent IM nailing 03/30/2017 per Dr. Marlou Sa. Partial weightbearing left lower extremity of 25 pounds. Acute blood loss anemia 9.9 and monitored. Maintained on Xarelto for DVT prophylaxis. Physical therapy evaluation completed with recommendations of physical medicine rehabilitation consult.   Past Medical History  Past Medical History:  Diagnosis Date  . Allergy    seasonal  fall  . Cancer ([place])    Basal  cell carcinoma; multiple  . Eczema   . Hyperlipidemia   . Hypertension     Family History  family history includes Arthritis in his mother; Heart disease (age of onset: 55) in his mother; Hypertension in his father.  Prior Rehab/Hospitalizations: No previous rehab admissions.  Has the patient had major surgery during 100 days prior to admission? No  Current Medications   Current Facility-Administered Medications:  .  HYDROcodone-acetaminophen (NORCO/VICODIN) 5-325 MG per tablet 1-2 tablet, 1-2 tablet, Oral, Q6H PRN, Toy Baker, MD, 2 tablet at 03/31/17 2005 .  menthol-cetylpyridinium (CEPACOL) lozenge 3 mg, 1 lozenge, Oral, PRN **OR** phenol (CHLORASEPTIC) mouth spray 1 spray, 1 spray, Mouth/Throat, PRN, [name], MD .  methocarbamol (ROBAXIN) tablet 500 mg, 500 mg, Oral, Q6H PRN, 500 mg at 03/30/17 1537 **OR** methocarbamol (ROBAXIN) 500 mg in dextrose 5 % 50 mL IVPB, 500 mg, Intravenous, Q6H PRN, Doutova, Anastassia, MD .  metoprolol succinate (TOPROL-XL) 24 hr tablet 50 mg, 50 mg, Oral, Daily, Doutova, Anastassia, MD, 50 mg at 03/31/17  0946 .  morphine 4 MG/ML injection 0.52 mg, 0.52 mg, Intravenous, Q2H PRN, Doutova, Anastassia, MD .  ondansetron (ZOFRAN) tablet 4 mg, 4 mg, Oral, Q6H PRN **OR** ondansetron (ZOFRAN) injection 4 mg, 4 mg, Intravenous, Q6H PRN, [name], MD .  ondansetron (ZOFRAN-ODT) disintegrating tablet 4 mg, 4 mg, Oral, Once PRN, Tanna Furry, MD .  pravastatin (PRAVACHOL) tablet 40 mg, 40 mg, Oral, Daily, Doutova, Anastassia, MD, 40 mg at 04/01/17 1046 .  rivaroxaban (XARELTO) tablet 10 mg, 10 mg, Oral, Daily, [name], MD, 10 mg at 04/01/17 1047 .  triamcinolone cream (KENALOG) 0.1 %, , Topical, BID, Doutova, Anastassia, MD  Patients Current Diet: Diet Heart Room service appropriate? Yes; Fluid consistency: Thin Diet - low sodium heart healthy  Precautions / Restrictions Precautions Precautions:  Fall Restrictions Weight Bearing Restrictions: Yes LLE Weight Bearing: Partial weight bearing LLE Partial Weight Bearing Percentage or Pounds: 25   Has the patient had 2 or more falls or a fall with injury in the past year?No.  Has had 1 fall resulting in current injury.  Prior Activity Level Limited Community (1-2x/wk): Went out 2-3 X a week with his daughter to the store, the bank and out to eat on Sundays.  He does not drive a car.  Home Assistive Devices / Equipment    Prior Device Use: Indicate devices/aids used by the patient prior to current illness, exacerbation or injury? None  Prior Functional Level Prior Function Level of Independence: Independent Comments: ADLs, IADLs, and very active  Self Care: Did the patient need help bathing, dressing, using the toilet or eating?  Independent  Indoor Mobility: Did the patient need assistance with walking from room to room (with or without device)? Independent  Stairs: Did the patient need assistance with internal or external stairs (with or without device)? Independent  Functional Cognition: Did the patient need help planning regular tasks such as shopping or remembering to take medications? Independent  Current Functional Level Cognition  Overall Cognitive Status: Within Functional Limits for tasks assessed Orientation Level: Oriented X4    Extremity Assessment (includes Sensation/Coordination)  Upper Extremity Assessment: Overall WFL for tasks assessed  Lower Extremity Assessment: Defer to PT evaluation LLE Deficits / Details: pt able to move ankle/wiggle toes, initiate quad set but no active hip/knee flexion    ADLs  Overall ADL's : Needs assistance/impaired Eating/Feeding: Set up, Sitting Grooming: Oral care, Set up, Sitting Upper Body Bathing: Set up, Sitting Lower Body Bathing: Minimal assistance, Sit to/from stand Upper Body Dressing : Set up, Sitting Lower Body Dressing: Minimal assistance, Sit to/from  stand Lower Body Dressing Details (indicate cue type and reason): Adjusted socks and demonstrated good ROM and motivation Toilet Transfer: RW, Moderate assistance (Simulated to recliner) Functional mobility during ADLs: Rolling walker, Moderate assistance, Cueing for sequencing, Cueing for safety General ADL Comments: Pt highly motivated and willing to work with OT.     Mobility  Overal bed mobility: Needs Assistance Bed Mobility: Supine to Sit Supine to sit: Mod assist, +2 for physical assistance General bed mobility comments: In recliner upon arrival and requested to stay in recliner at end of session    Transfers  Overall transfer level: Needs assistance Equipment used: Rolling walker (2 wheeled) Transfers: Sit to/from Stand Sit to Stand: Mod assist General transfer comment: v/c's to push up from bed, modA for initial power up, minA once in standing    Ambulation / Gait / Stairs / Wheelchair Mobility  Ambulation/Gait Ambulation/Gait assistance: Min assist, +  2 safety/equipment (chair follow) Ambulation Distance (Feet): 5 Feet Assistive device: Rolling walker (2 wheeled) Gait Pattern/deviations: Step-to pattern, Antalgic General Gait Details: pt able to maintain L LE PWB, limited by pain and endurance Gait velocity: slow Gait velocity interpretation: Below normal speed for age/gender    Posture / Balance Balance Overall balance assessment: Needs assistance Sitting-balance support: Feet supported, Bilateral upper extremity supported Sitting balance-Leahy Scale: Fair Standing balance support: Bilateral upper extremity supported Standing balance-Leahy Scale: Poor Standing balance comment: requires RW    Special needs/care consideration BiPAP/CPAP No CPM No Continuous Drip IV No Dialysis No         Life Vest No Oxygen No Special Bed No Trach Size No Wound Vac (area) No     Skin Has eczema. Left hip post op incision with dressing.                              Bowel mgmt:  Last BM 03/28/17 Bladder mgmt: Voiding in urinal Diabetic mgmt No    Previous Home Environment Living Arrangements: Alone Available Help at Discharge: Family, Available PRN/intermittently (Daughter can come stay PRN) Type of Home: House Home Access: Stairs to enter [employer] of Steps: 2 Additional Comments: pt lives alone and reports daughter lives down the street. 1 story home wiht 2 steps to enter with rail. Pt reports he would have to ask dtr if she could stay with him  Discharge Living Setting Plans for Discharge Living Setting: Patient's home, Alone, House (Lives alone.  Dtr is close by.  Ex-wife is local.) Type of Home at Discharge: House Discharge Home Layout: One level Discharge Home Access: Stairs to enter Entrance Stairs-Number of Steps: 1 at back entry and 5 at front entry. Does the patient have any problems obtaining your medications?: No  Social/Family/Support Systems Patient Roles: Parent, Other (Comment) (Has a daughter, son and an ex-wife.) Contact Information: [name] Jodelle Gross - daughter Anticipated Caregiver: Daughter, ex-wife Anticipated Caregiver's Contact Information: [name] - [phone] Ability/Limitations of Caregiver: Dtr is self employed as Charity fundraiser, lives 3-4 blocks away.  Is friends with ex-wife and she is local. Caregiver Availability: Intermittent (I spoke with patient about likely need for supervision at DC) Discharge Plan Discussed with Primary Caregiver: Yes Is Caregiver In Agreement with Plan?: Yes Does Caregiver/Family have Issues with Lodging/Transportation while Pt is in Rehab?: No  Goals/Additional Needs Patient/Family Goal for Rehab: PT supervision, OT supervision to min assist goals Expected length of stay: 12-16 days Cultural Considerations: None Dietary Needs: Heart diet, thin liquids Equipment Needs: TBD Pt/Family Agrees to Admission and willing to participate: Yes Program Orientation Provided & Reviewed with Pt/Caregiver  Including Roles  & Responsibilities: Yes  Decrease burden of Care through IP rehab admission: N/A  Possible need for SNF placement upon discharge: Not planned  Patient Condition: This patient's condition remains as documented in the consult dated 04/01/17, in which the Rehabilitation Physician determined and documented that the patient's condition is appropriate for intensive rehabilitative care in an inpatient rehabilitation facility. Will admit to inpatient rehab today.  Preadmission Screen Completed By:  Retta Diones, 04/01/2017 12:15 PM ______________________________________________________________________   Discussed status with Dr. Naaman Plummer on 04/01/17 at 1214 and received telephone approval for admission today.  Admission Coordinator:  Retta Diones, time 1214/Date 04/01/17

## 2017-04-01 NOTE — Progress Notes (Signed)
Retta Diones, RN Rehab Admission Coordinator Signed Physical Medicine and Rehabilitation  PMR Pre-admission Date of Service: 04/01/2017 12:06 PM  Related encounter: ED to Hosp-Admission (Current) from 03/29/2017 in [place]       [] Hide copied text PMR Admission Coordinator Pre-Admission Assessment  Patient: [name] is an 83 y.o., male MRN: [id number] DOB: [birth year] Height: 5' 7.5" (171.5 cm) Weight: 67.1 kg (148 lb)                                                                                                                                                  Insurance Information HMO: No    PPO:       PCP:       IPA:       80/20:       OTHER:   PRIMARY:  Medicare A/B      Policy#:  [id number]      Subscriber: [name] CM Name:        Phone#:       Fax#:   Pre-Cert#:        Employer: Retired Benefits:  Phone #:       Name: Checked in Financial planner. Date: A=10/28/98 and B=04/28/03     Deduct:  $1340      Out of Pocket Max: none      Life Max: unlimited CIR: 100%      SNF: 100 days Outpatient: 80%     Co-Pay: 20% Home Health: 100%      Co-Pay: none DME: 80%     Co-Pay: 20% Providers: patient's choice  Medicaid Application Date:        Case Manager:   Disability Application Date:        Case Worker:    Emergency Contact Information        Contact Information    Name Relation Home Work Mobile   Cumming,[name]  [phone]  [phone]     Current Medical History  Patient Admitting Diagnosis:  L IT hip fracture  History of Present Illness: An 83 y.o.right handed malewith history of hypertension, CKD stage II and chronic leg edema. Per chart review and patient, patient independent living alone prior to admission. He has a supportive daughter lives close by. 1 level home with 2 steps to entry. Presented 03/29/2017 after a fall of which he was down for extended time. No loss of consciousness. X-rays and imaging revealed left  intertrochanteric hip fracture. Underwent IM nailing 03/30/2017 per Dr. Marlou Sa. Partial weightbearing left lower extremity of 25 pounds. Acute blood loss anemia 9.9 and monitored. Maintained on Xareltofor DVT prophylaxis. Physical therapy evaluation completed with recommendations of physical medicine rehabilitation consult.   Past Medical History      Past Medical History:  Diagnosis Date  . Allergy    seasonal  fall  .  Cancer ([place])    Basal cell carcinoma; multiple  . Eczema   . Hyperlipidemia   . Hypertension     Family History  family history includes Arthritis in his mother; Heart disease (age of onset: 62) in his mother; Hypertension in his father.  Prior Rehab/Hospitalizations: No previous rehab admissions.  Has the patient had major surgery during 100 days prior to admission? No  Current Medications   Current Facility-Administered Medications:  .  HYDROcodone-acetaminophen (NORCO/VICODIN) 5-325 MG per tablet 1-2 tablet, 1-2 tablet, Oral, Q6H PRN, Toy Baker, MD, 2 tablet at 03/31/17 2005 .  menthol-cetylpyridinium (CEPACOL) lozenge 3 mg, 1 lozenge, Oral, PRN **OR** phenol (CHLORASEPTIC) mouth spray 1 spray, 1 spray, Mouth/Throat, PRN, [name], MD .  methocarbamol (ROBAXIN) tablet 500 mg, 500 mg, Oral, Q6H PRN, 500 mg at 03/30/17 1537 **OR** methocarbamol (ROBAXIN) 500 mg in dextrose 5 % 50 mL IVPB, 500 mg, Intravenous, Q6H PRN, Doutova, Anastassia, MD .  metoprolol succinate (TOPROL-XL) 24 hr tablet 50 mg, 50 mg, Oral, Daily, Doutova, Anastassia, MD, 50 mg at 03/31/17 0946 .  morphine 4 MG/ML injection 0.52 mg, 0.52 mg, Intravenous, Q2H PRN, Doutova, Anastassia, MD .  ondansetron (ZOFRAN) tablet 4 mg, 4 mg, Oral, Q6H PRN **OR** ondansetron (ZOFRAN) injection 4 mg, 4 mg, Intravenous, Q6H PRN, [name], MD .  ondansetron (ZOFRAN-ODT) disintegrating tablet 4 mg, 4 mg, Oral, Once PRN, Tanna Furry, MD .  pravastatin (PRAVACHOL) tablet 40  mg, 40 mg, Oral, Daily, Doutova, Anastassia, MD, 40 mg at 04/01/17 1046 .  rivaroxaban (XARELTO) tablet 10 mg, 10 mg, Oral, Daily, [name], MD, 10 mg at 04/01/17 1047 .  triamcinolone cream (KENALOG) 0.1 %, , Topical, BID, Doutova, Anastassia, MD  Patients Current Diet: Diet Heart Room service appropriate? Yes; Fluid consistency: Thin Diet - low sodium heart healthy  Precautions / Restrictions Precautions Precautions: Fall Restrictions Weight Bearing Restrictions: Yes LLE Weight Bearing: Partial weight bearing LLE Partial Weight Bearing Percentage or Pounds: 25   Has the patient had 2 or more falls or a fall with injury in the past year?No.  Has had 1 fall resulting in current injury.  Prior Activity Level Limited Community (1-2x/wk): Went out 2-3 X a week with his daughter to the store, the bank and out to eat on Sundays.  He does not drive a car.  Home Assistive Devices / Equipment  Prior Device Use: Indicate devices/aids used by the patient prior to current illness, exacerbation or injury? None  Prior Functional Level Prior Function Level of Independence: Independent Comments: ADLs, IADLs, and very active  Self Care: Did the patient need help bathing, dressing, using the toilet or eating?  Independent  Indoor Mobility: Did the patient need assistance with walking from room to room (with or without device)? Independent  Stairs: Did the patient need assistance with internal or external stairs (with or without device)? Independent  Functional Cognition: Did the patient need help planning regular tasks such as shopping or remembering to take medications? Independent  Current Functional Level Cognition  Overall Cognitive Status: Within Functional Limits for tasks assessed Orientation Level: Oriented X4    Extremity Assessment (includes Sensation/Coordination)  Upper Extremity Assessment: Overall WFL for tasks assessed  Lower Extremity Assessment:  Defer to PT evaluation LLE Deficits / Details: pt able to move ankle/wiggle toes, initiate quad set but no active hip/knee flexion    ADLs  Overall ADL's : Needs assistance/impaired Eating/Feeding: Set up, Sitting Grooming: Oral care, Set up, Sitting Upper  Body Bathing: Set up, Sitting Lower Body Bathing: Minimal assistance, Sit to/from stand Upper Body Dressing : Set up, Sitting Lower Body Dressing: Minimal assistance, Sit to/from stand Lower Body Dressing Details (indicate cue type and reason): Adjusted socks and demonstrated good ROM and motivation Toilet Transfer: RW, Moderate assistance (Simulated to recliner) Functional mobility during ADLs: Rolling walker, Moderate assistance, Cueing for sequencing, Cueing for safety General ADL Comments: Pt highly motivated and willing to work with OT.     Mobility  Overal bed mobility: Needs Assistance Bed Mobility: Supine to Sit Supine to sit: Mod assist, +2 for physical assistance General bed mobility comments: In recliner upon arrival and requested to stay in recliner at end of session    Transfers  Overall transfer level: Needs assistance Equipment used: Rolling walker (2 wheeled) Transfers: Sit to/from Stand Sit to Stand: Mod assist General transfer comment: v/c's to push up from bed, modA for initial power up, minA once in standing    Ambulation / Gait / Stairs / Wheelchair Mobility  Ambulation/Gait Ambulation/Gait assistance: Min assist, +2 safety/equipment (chair follow) Ambulation Distance (Feet): 5 Feet Assistive device: Rolling walker (2 wheeled) Gait Pattern/deviations: Step-to pattern, Antalgic General Gait Details: pt able to maintain L LE PWB, limited by pain and endurance Gait velocity: slow Gait velocity interpretation: Below normal speed for age/gender    Posture / Balance Balance Overall balance assessment: Needs assistance Sitting-balance support: Feet supported, Bilateral upper extremity  supported Sitting balance-Leahy Scale: Fair Standing balance support: Bilateral upper extremity supported Standing balance-Leahy Scale: Poor Standing balance comment: requires RW    Special needs/care consideration BiPAP/CPAP No CPM No Continuous Drip IV No Dialysis No         Life Vest No Oxygen No Special Bed No Trach Size No Wound Vac (area) No     Skin Has eczema. Left hip post op incision with dressing.                              Bowel mgmt: Last BM 03/28/17 Bladder mgmt: Voiding in urinal Diabetic mgmt No    Previous Home Environment Living Arrangements: Alone Available Help at Discharge: Family, Available PRN/intermittently (Daughter can come stay PRN) Type of Home: House Home Access: Stairs to enter [employer] of Steps: 2 Additional Comments: pt lives alone and reports daughter lives down the street. 1 story home wiht 2 steps to enter with rail. Pt reports he would have to ask dtr if she could stay with him  Discharge Living Setting Plans for Discharge Living Setting: Patient's home, Alone, House (Lives alone.  Dtr is close by.  Ex-wife is local.) Type of Home at Discharge: House Discharge Home Layout: One level Discharge Home Access: Stairs to enter Entrance Stairs-Number of Steps: 1 at back entry and 5 at front entry. Does the patient have any problems obtaining your medications?: No  Social/Family/Support Systems Patient Roles: Parent, Other (Comment) (Has a daughter, son and an ex-wife.) Contact Information: [name] Jodelle Gross - daughter Anticipated Caregiver: Daughter, ex-wife Anticipated Caregiver's Contact Information: [name] - [phone] Ability/Limitations of Caregiver: Dtr is self employed as Charity fundraiser, lives 3-4 blocks away.  Is friends with ex-wife and she is local. Caregiver Availability: Intermittent (I spoke with patient about likely need for supervision at DC) Discharge Plan Discussed with Primary Caregiver: Yes Is Caregiver In Agreement  with Plan?: Yes Does Caregiver/Family have Issues with Lodging/Transportation while Pt is in Rehab?: No  Goals/Additional Needs Patient/Family Goal for  Rehab: PT supervision, OT supervision to min assist goals Expected length of stay: 12-16 days Cultural Considerations: None Dietary Needs: Heart diet, thin liquids Equipment Needs: TBD Pt/Family Agrees to Admission and willing to participate: Yes Program Orientation Provided & Reviewed with Pt/Caregiver Including Roles  & Responsibilities: Yes  Decrease burden of Care through IP rehab admission: N/A  Possible need for SNF placement upon discharge: Not planned  Patient Condition: This patient's condition remains as documented in the consult dated 04/01/17, in which the Rehabilitation Physician determined and documented that the patient's condition is appropriate for intensive rehabilitative care in an inpatient rehabilitation facility. Will admit to inpatient rehab today.  Preadmission Screen Completed By:  Retta Diones, 04/01/2017 12:15 PM ______________________________________________________________________   Discussed status with Dr. Naaman Plummer on 04/01/17 at 1214 and received telephone approval for admission today.  Admission Coordinator:  Retta Diones, time 1214/Date 04/01/17       Cosigned by: Meredith Staggers, MD at 04/01/2017 1:06 PM  Revision History

## 2017-04-01 NOTE — Progress Notes (Signed)
Wesley Arn, MD Physician Signed Physical Medicine and Rehabilitation  Consult Note Date of Service: 03/31/2017 3:12 PM  Related encounter: ED to Hosp-Admission (Current) from 03/29/2017 in [place] All Collapse All   [] Hide copied text [] Hover for attribution information      Physical Medicine and Rehabilitation Consult Reason for Consult: Decreased functional mobility Referring Physician: [facility]   HPI: [name] is a 83 y.o. right handed male with history of hypertension, CKD stage II and chronic leg edema. Per chart review and patient, patient independent living alone prior to admission. He has a supportive daughter lives close by. 1 level home with 2 steps to entry. Presented 03/29/2017 after a fall of which he was down for extended time. No loss of consciousness. X-rays and imaging revealed left intertrochanteric hip fracture. Underwent IM nailing 03/30/2017 per Dr. Marlou Sa. Partial weightbearing left lower extremity of 25 pounds. Acute blood loss anemia 9.9 and monitored. Maintained on Xarelto for DVT prophylaxis. Physical therapy evaluation completed with recommendations of physical medicine rehabilitation consult.  Review of Systems  Constitutional: Negative for chills and fever.  HENT: Negative for hearing loss.   Eyes: Negative for blurred vision and double vision.  Respiratory: Negative for cough and shortness of breath.   Cardiovascular: Positive for leg swelling. Negative for palpitations.  Gastrointestinal: Positive for constipation. Negative for nausea and vomiting.  Genitourinary: Positive for urgency. Negative for dysuria, flank pain and hematuria.  Musculoskeletal: Positive for falls and myalgias.  Skin: Negative for rash.  Neurological: Negative for seizures, loss of consciousness and weakness.  All other systems reviewed and are negative.      Past Medical History:  Diagnosis Date  . Allergy     seasonal  fall  . Cancer ([place])    Basal cell carcinoma; multiple  . Eczema   . Hyperlipidemia   . Hypertension         Past Surgical History:  Procedure Laterality Date  . CATARACT EXTRACTION, BILATERAL  06/29/2015  . INTRAMEDULLARY (IM) NAIL INTERTROCHANTERIC Left 03/30/2017   Procedure: INTRAMEDULLARY (IM) NAIL INTERTROCHANTRIC;  Surgeon: [name], MD;  Location: [facility];  Service: Orthopedics;  Laterality: Left;  . MOHS SURGERY    . TONSILLECTOMY  1940 maybe        Family History  Problem Relation Age of Onset  . Arthritis Mother   . Heart disease Mother 76  . Hypertension Father    Social History:  reports that he has never smoked. He has never used smokeless tobacco. He reports that he does not drink alcohol or use drugs. Allergies: No Known Allergies       Medications Prior to Admission  Medication Sig Dispense Refill  . albuterol (PROVENTIL HFA;VENTOLIN HFA) 108 (90 Base) MCG/ACT inhaler Inhale 2 puffs into the lungs every 6 (six) hours as needed for wheezing or shortness of breath (cough, shortness of breath or wheezing.). 1 Inhaler 3  . lisinopril (PRINIVIL,ZESTRIL) 5 MG tablet Take 1 tablet (5 mg total) by mouth daily. 90 tablet 3  . metoprolol succinate (TOPROL-XL) 50 MG 24 hr tablet Take 1 tablet (50 mg total) by mouth daily. 90 tablet 3  . pravastatin (PRAVACHOL) 40 MG tablet TAKE 1 TABLET ONCE DAILY. 90 tablet 3  . triamcinolone cream (KENALOG) 0.1 % Apply topically 2 (two) times daily. APPLY TWICE DAILY.  1:1 RATIO PLEASE 454 g 0  . furosemide (LASIX) 20 MG tablet Take 1 tablet (20 mg  total) by mouth daily. (Patient not taking: Reported on 03/29/2017) 90 tablet 1    Home: [place] expects to be discharged to:: Unsure Additional Comments: pt lives alone and reports daughter lives down the street. 1 story home wiht 2 steps to enter with rail. Pt reports he would have to ask dtr if she could stay with him  Functional  History: Prior Function Level of Independence: Independent Comments: no AD for ambulation, very active Functional Status:  Mobility: Bed Mobility Overal bed mobility: Needs Assistance Bed Mobility: Supine to Sit Supine to sit: Mod assist, +2 for physical assistance General bed mobility comments: assist for L LE management and initial trunk flexion to long sit Transfers Overall transfer level: Needs assistance Equipment used: Rolling walker (2 wheeled) Transfers: Sit to/from Stand Sit to Stand: Mod assist, +2 physical assistance General transfer comment: v/c's to push up from bed, modAx2 for initial power up, minA once in standing Ambulation/Gait Ambulation/Gait assistance: Min assist, +2 safety/equipment (chair follow) Ambulation Distance (Feet): 5 Feet Assistive device: Rolling walker (2 wheeled) Gait Pattern/deviations: Step-to pattern, Antalgic General Gait Details: pt able to maintain L LE PWB, limited by pain and endurance Gait velocity: slow Gait velocity interpretation: Below normal speed for age/gender  ADL:  Cognition: Cognition Overall Cognitive Status: Within Functional Limits for tasks assessed Orientation Level: Oriented X4 Cognition Arousal/Alertness: Awake/alert Behavior During Therapy: WFL for tasks assessed/performed Overall Cognitive Status: Within Functional Limits for tasks assessed  Blood pressure 106/62, pulse 71, temperature 98 F (36.7 C), temperature source Oral, resp. rate 16, SpO2 99 %. Physical Exam  Vitals reviewed. Constitutional: He is oriented to person, place, and time. He appears well-developed.  Frail  HENT:  Head: Normocephalic and atraumatic.  Eyes: EOM are normal. Right eye exhibits no discharge. Left eye exhibits no discharge.  Neck: Normal range of motion. Neck supple. No thyromegaly present.  Cardiovascular: Normal rate, regular rhythm and normal heart sounds.   Respiratory: Effort normal and breath sounds normal. No  respiratory distress.  GI: Soft. Bowel sounds are normal. He exhibits no distension.  Musculoskeletal: He exhibits edema and tenderness.  Neurological: He is alert and oriented to person, place, and time.  Motor: B/l UE 4+/5 proximal to distal RLE: 4/5 proximally, 5/5 distally LLE: HF: 2/5, KE 2+/5, ADF/PF 4+/5 Sensation intact to light touch  Skin: Skin is warm and dry.  Hip incision is dressed appropriately tender  Psychiatric: He has a normal mood and affect. His behavior is normal.    Lab Results Last 24 Hours  Results for orders placed or performed during the hospital encounter of 03/29/17 (from the past 24 hour(s))  CBC     Status: Abnormal   Collection Time: 03/31/17  4:55 AM  Result Value Ref Range   WBC 13.3 (H) 4.0 - 10.5 K/uL   RBC 3.22 (L) 4.22 - 5.81 MIL/uL   Hemoglobin 9.9 (L) 13.0 - 17.0 g/dL   HCT 30.2 (L) 39.0 - 52.0 %   MCV 93.8 78.0 - 100.0 fL   MCH 30.7 26.0 - 34.0 pg   MCHC 32.8 30.0 - 36.0 g/dL   RDW 12.4 11.5 - 15.5 %   Platelets 129 (L) 150 - 400 K/uL  Basic metabolic panel     Status: Abnormal   Collection Time: 03/31/17  4:55 AM  Result Value Ref Range   Sodium 137 135 - 145 mmol/L   Potassium 4.2 3.5 - 5.1 mmol/L   Chloride 105 101 - 111 mmol/L  CO2 26 22 - 32 mmol/L   Glucose, Bld 117 (H) 65 - 99 mg/dL   BUN 15 6 - 20 mg/dL   Creatinine, Ser 0.99 0.61 - 1.24 mg/dL   Calcium 8.3 (L) 8.9 - 10.3 mg/dL   GFR calc non Af Amer >60 >60 mL/min   GFR calc Af Amer >60 >60 mL/min   Anion gap 6 5 - 15  Protime-INR     Status: Abnormal   Collection Time: 03/31/17  4:55 AM  Result Value Ref Range   Prothrombin Time 15.3 (H) 11.4 - 15.2 seconds   INR 1.20       Imaging Results (Last 48 hours)  Dg Chest 1 View  Result Date: 03/29/2017 CLINICAL DATA:  Preop evaluation, known left hip fracture EXAM: CHEST 1 VIEW COMPARISON:  03/19/2016 FINDINGS: Cardiac shadow is within normal limits. Elevation of the right hemidiaphragm is  seen. A hiatal hernia is noted. No focal infiltrate or sizable effusion is noted. No bony abnormality is seen. IMPRESSION: No acute abnormality seen. Electronically Signed   By: Inez Catalina M.D.   On: 03/29/2017 16:38   Dg Ankle Complete Left  Result Date: 03/29/2017 CLINICAL DATA:  Fall, ankle pain EXAM: LEFT ANKLE COMPLETE - 3+ VIEW COMPARISON:  None. FINDINGS: No acute bony abnormality. Specifically, no fracture, subluxation, or dislocation. Soft tissues are intact. IMPRESSION: No acute bony abnormality. Electronically Signed   By: Rolm Baptise M.D.   On: 03/29/2017 16:39   Pelvis Portable  Result Date: 03/30/2017 CLINICAL DATA:  Status post fixation of a left intertrochanteric fracture today. The patient suffered a fracture and a fall 03/29/2017. Initial encounter. EXAM: PORTABLE PELVIS 1-2 VIEWS COMPARISON:  Plain films left hip 03/29/2017. Intraoperative imaging today. FINDINGS: Long intramedullary nail with 2 hip screws is in place. Hardware is intact. There is improved position and alignment of the patient's intertrochanteric fracture. No acute abnormality. IMPRESSION: Status post fixation of a left intertrochanteric fracture. No acute finding. Electronically Signed   By: Inge Rise M.D.   On: 03/30/2017 14:57   Dg Knee Complete 4 Views Left  Result Date: 03/29/2017 CLINICAL DATA:  Fall.  Pain EXAM: LEFT KNEE - COMPLETE 4+ VIEW COMPARISON:  None. FINDINGS: No acute bony abnormality. Specifically, no fracture, subluxation, or dislocation. Soft tissues are intact. No joint effusion. IMPRESSION: No acute bony abnormality. Electronically Signed   By: Rolm Baptise M.D.   On: 03/29/2017 16:38   Dg C-arm 1-60 Min  Result Date: 03/30/2017 CLINICAL DATA:  ORIF left femur fracture. EXAM: DG C-ARM 61-120 MIN; LEFT FEMUR 2 VIEWS COMPARISON:  03/29/2017 FINDINGS: Four intraoperative spot films of the left femur are submitted postoperatively for interpretation. Placement of an intramedullary  nail and screws noted traversing a intertrochanteric left femur fracture, in near-anatomic alignment and position. No definite complicating features noted. IMPRESSION: ORIF proximal left femur fracture, in near-anatomic alignment and position. Electronically Signed   By: Margarette Canada M.D.   On: 03/30/2017 13:05   Dg Hip Unilat With Pelvis 2-3 Views Left  Result Date: 03/29/2017 CLINICAL DATA:  Set fall, hip pain EXAM: DG HIP (WITH OR WITHOUT PELVIS) 2-3V LEFT COMPARISON:  None. FINDINGS: There is a comminuted left femoral intertrochanteric fracture. Varus angulation. No subluxation or dislocation. IMPRESSION: Comminuted left femoral intertrochanteric fracture with varus angulation. Electronically Signed   By: Rolm Baptise M.D.   On: 03/29/2017 16:38   Dg Femur Min 2 Views Left  Result Date: 03/30/2017 CLINICAL DATA:  ORIF left femur fracture.  EXAM: DG C-ARM 61-120 MIN; LEFT FEMUR 2 VIEWS COMPARISON:  03/29/2017 FINDINGS: Four intraoperative spot films of the left femur are submitted postoperatively for interpretation. Placement of an intramedullary nail and screws noted traversing a intertrochanteric left femur fracture, in near-anatomic alignment and position. No definite complicating features noted. IMPRESSION: ORIF proximal left femur fracture, in near-anatomic alignment and position. Electronically Signed   By: Margarette Canada M.D.   On: 03/30/2017 13:05     Assessment/Plan: Diagnosis:  Left intertrochanteric hip fracture Labs independently reviewed.  Records reviewed and summated above.  1. Does the need for close, 24 hr/day medical supervision in concert with the patient's rehab needs make it unreasonable for this patient to be served in a less intensive setting? Yes 2. Co-Morbidities requiring supervision/potential complications: HTN (monitor and provide prns in accordance with increased physical exertion and pain), CKD stage II (avoid nephrotoxic meds), chronic leg edema, Acute blood loss  anemia (transfuse if necessary to ensure appropriate perfusion for increased activity tolerance) 3. Due to safety, skin/wound care, disease management, pain management and patient education, does the patient require 24 hr/day rehab nursing? Yes 4. Does the patient require coordinated care of a physician, rehab nurse, PT (1-2 hrs/day, 5 days/week) and OT (1-2 hrs/day, 5 days/week) to address physical and functional deficits in the context of the above medical diagnosis(es)? Yes Addressing deficits in the following areas: balance, endurance, locomotion, strength, transferring, bathing, dressing, toileting and psychosocial support 5. Can the patient actively participate in an intensive therapy program of at least 3 hrs of therapy per day at least 5 days per week? Yes 6. The potential for patient to make measurable gains while on inpatient rehab is excellent 7. Anticipated functional outcomes upon discharge from inpatient rehab are supervision  with PT, supervision and min assist with OT, n/a with SLP. 8. Estimated rehab length of stay to reach the above functional goals is: 12-16 days. 9. Anticipated D/C setting: Home 10. Anticipated post D/C treatments: HH therapy and Home excercise program 11. Overall Rehab/Functional Prognosis: good  RECOMMENDATIONS: This patient's condition is appropriate for continued rehabilitative care in the following setting: CIR Patient has agreed to participate in recommended program. Yes Note that insurance prior authorization may be required for reimbursement for recommended care.  Comment: Rehab Admissions Coordinator to follow up.  Delice Lesch, MD, Mellody Drown Cathlyn Parsons., PA-C 03/31/2017    Revision History                   Routing History

## 2017-04-01 NOTE — Progress Notes (Signed)
Physical Therapy Treatment Patient Details Name: [name] MRN: [id number] DOB: 1932/07/09 Today's Date: 04/01/2017    History of Present Illness Wesley Hodgeis a 83 y.o.malewith medical history significant of HTN,HLD, Eczema, CKD stage 2, chronic leg edema, Hx of Bigeminy. Presented to the ED after a mechanical fall, noted to have L intertrochanteric Hip Fracture s/p ORIF.    PT Comments    Pt motivated and eager to work with PT. Pt remains to have extremely weak L LE and fatigues quickly with ambulation due to L LE PWB and dependence on UEs.Pt given HEP. Con't to recommend CIR upon d/c for maximal functional recovery.   Follow Up Recommendations  CIR     Equipment Recommendations  Rolling walker with 5" wheels;3in1 (PT)    Recommendations for Other Services Rehab consult     Precautions / Restrictions Precautions Precautions: Fall Restrictions Weight Bearing Restrictions: Yes LLE Weight Bearing: Partial weight bearing LLE Partial Weight Bearing Percentage or Pounds: 25    Mobility  Bed Mobility Overal bed mobility: Needs Assistance Bed Mobility: Supine to Sit     Supine to sit: Mod assist;+2 for physical assistance     General bed mobility comments: modA for L LE mvmt to EOB and modA for trunk elevation  Transfers Overall transfer level: Needs assistance Equipment used: Rolling walker (2 wheeled) Transfers: Sit to/from Stand Sit to Stand: Mod assist;+2 physical assistance         General transfer comment: v/c's to push up from bed, modA for initial power up, minA once in standing  Ambulation/Gait Ambulation/Gait assistance: Mod assist;+2 physical assistance Ambulation Distance (Feet): 5 Feet Assistive device: Rolling walker (2 wheeled) Gait Pattern/deviations: Step-to pattern;Antalgic Gait velocity: slow Gait velocity interpretation: Below normal speed for age/gender General Gait Details: pt fatigued quickly, minA for L LE management   Stairs             Wheelchair Mobility    Modified Rankin (Stroke Patients Only)       Balance Overall balance assessment: Needs assistance Sitting-balance support: Feet supported;Bilateral upper extremity supported Sitting balance-Leahy Scale: Fair     Standing balance support: Bilateral upper extremity supported Standing balance-Leahy Scale: Poor Standing balance comment: requires RW                            Cognition Arousal/Alertness: Awake/alert Behavior During Therapy: WFL for tasks assessed/performed Overall Cognitive Status: Within Functional Limits for tasks assessed                                        Exercises General Exercises - Lower Extremity Ankle Circles/Pumps: AROM;Both;10 reps;Supine Quad Sets: AROM;Both;10 reps;Supine Gluteal Sets: AROM;Both;10 reps;Supine    General Comments General comments (skin integrity, edema, etc.): dressing intact      Pertinent Vitals/Pain Pain Assessment: Faces Faces Pain Scale: Hurts little more Pain Location: L hip Pain Descriptors / Indicators: Sore Pain Intervention(s): Monitored during session    Home Living                      Prior Function            PT Goals (current goals can now be found in the care plan section) Acute Rehab PT Goals Patient Stated Goal: get stronger Progress towards PT goals: Progressing toward goals    Frequency  Min 5X/week      PT Plan Current plan remains appropriate    Co-evaluation              AM-PAC PT "6 Clicks" Daily Activity  Outcome Measure  Difficulty turning over in bed (including adjusting bedclothes, sheets and blankets)?: A Lot Difficulty moving from lying on back to sitting on the side of the bed? : A Lot Difficulty sitting down on and standing up from a chair with arms (e.g., wheelchair, bedside commode, etc,.)?: A Lot Help needed moving to and from a bed to chair (including a wheelchair)?: A Lot Help needed  walking in hospital room?: A Lot Help needed climbing 3-5 steps with a railing? : A Lot 6 Click Score: 12    End of Session Equipment Utilized During Treatment: Gait belt Activity Tolerance: Patient tolerated treatment well Patient left: in chair;with call bell/phone within reach;with chair alarm set Nurse Communication: Mobility status PT Visit Diagnosis: Difficulty in walking, not elsewhere classified (R26.2) Pain - Right/Left: Left Pain - part of body: Hip     Time: 9987-2158 PT Time Calculation (min) (ACUTE ONLY): 29 min  Charges:  $Gait Training: 8-22 mins $Therapeutic Exercise: 8-22 mins                    G Codes:       Kittie Plater, PT, DPT Pager #: [phone] Office #: [phone]    [place] 04/01/2017, 1:07 PM

## 2017-04-01 NOTE — H&P (Signed)
Physical Medicine and Rehabilitation Admission H&P    Chief Complaint  Patient presents with  . Fall  . Hip Pain  . Ankle Pain  . Knee Pain  : HPI: [name] is a 83 y.o. right handed male with history of hypertension, CKD stage II and chronic leg edema. Per chart review and patient, patient independent living alone prior to admission. He has a supportive daughter lives close by. 1 level home with 2 steps to entry. Presented 03/29/2017 after a fall of which he was down for extended time. No loss of consciousness. X-rays and imaging revealed left intertrochanteric hip fracture. Underwent IM nailing 03/30/2017 per Dr. Marlou Sa. Partial weightbearing left lower extremity of 25 pounds. Acute blood loss anemia 9.1 and monitored. Maintained on Xarelto for DVT prophylaxis. Physical and occupational therapy evaluations completed with recommendations of physical medicine rehabilitation consult. Patient was admitted for a comprehensive rehabilitation program  Review of Systems  Constitutional: Negative for chills and fever.  HENT: Negative for hearing loss.   Eyes: Negative for blurred vision and double vision.  Respiratory: Negative for shortness of breath.   Cardiovascular: Positive for leg swelling. Negative for chest pain and palpitations.  Gastrointestinal: Positive for constipation. Negative for nausea.  Genitourinary: Positive for urgency. Negative for hematuria.  Musculoskeletal: Positive for falls and myalgias.  Skin: Negative for rash.  Neurological: Negative for seizures.  All other systems reviewed and are negative.  Past Medical History:  Diagnosis Date  . Allergy    seasonal  fall  . Cancer ([place])    Basal cell carcinoma; multiple  . Eczema   . Hyperlipidemia   . Hypertension    Past Surgical History:  Procedure Laterality Date  . CATARACT EXTRACTION, BILATERAL  06/29/2015  . INTRAMEDULLARY (IM) NAIL INTERTROCHANTERIC Left 03/30/2017   Procedure: INTRAMEDULLARY (IM) NAIL  INTERTROCHANTRIC;  Surgeon: [name], MD;  Location: [facility];  Service: Orthopedics;  Laterality: Left;  . MOHS SURGERY    . TONSILLECTOMY  1940 maybe   Family History  Problem Relation Age of Onset  . Arthritis Mother   . Heart disease Mother 15  . Hypertension Father    Social History:  reports that he has never smoked. He has never used smokeless tobacco. He reports that he does not drink alcohol or use drugs. Allergies: No Known Allergies Medications Prior to Admission  Medication Sig Dispense Refill  . albuterol (PROVENTIL HFA;VENTOLIN HFA) 108 (90 Base) MCG/ACT inhaler Inhale 2 puffs into the lungs every 6 (six) hours as needed for wheezing or shortness of breath (cough, shortness of breath or wheezing.). 1 Inhaler 3  . lisinopril (PRINIVIL,ZESTRIL) 5 MG tablet Take 1 tablet (5 mg total) by mouth daily. 90 tablet 3  . metoprolol succinate (TOPROL-XL) 50 MG 24 hr tablet Take 1 tablet (50 mg total) by mouth daily. 90 tablet 3  . pravastatin (PRAVACHOL) 40 MG tablet TAKE 1 TABLET ONCE DAILY. 90 tablet 3  . triamcinolone cream (KENALOG) 0.1 % Apply topically 2 (two) times daily. APPLY TWICE DAILY.  1:1 RATIO PLEASE 454 g 0  . furosemide (LASIX) 20 MG tablet Take 1 tablet (20 mg total) by mouth daily. (Patient not taking: Reported on 03/29/2017) 90 tablet 1    Home: [place] expects to be discharged to:: Unsure Living Arrangements: Alone Available Help at Discharge: Family, Available PRN/intermittently (Daughter can come stay PRN) Type of Home: House Home Access: Stairs to enter [employer] of Steps: 2 Additional Comments: pt lives alone  and reports daughter lives down the street. 1 story home wiht 2 steps to enter with rail. Pt reports he would have to ask dtr if she could stay with him   Functional History: Prior Function Level of Independence: Independent Comments: ADLs, IADLs, and very active  Functional Status:  Mobility: Bed  Mobility Overal bed mobility: Needs Assistance Bed Mobility: Supine to Sit Supine to sit: Mod assist, +2 for physical assistance General bed mobility comments: In recliner upon arrival and requested to stay in recliner at end of session Transfers Overall transfer level: Needs assistance Equipment used: Rolling walker (2 wheeled) Transfers: Sit to/from Stand Sit to Stand: Mod assist General transfer comment: v/c's to push up from bed, modA for initial power up, minA once in standing Ambulation/Gait Ambulation/Gait assistance: Min assist, +2 safety/equipment (chair follow) Ambulation Distance (Feet): 5 Feet Assistive device: Rolling walker (2 wheeled) Gait Pattern/deviations: Step-to pattern, Antalgic General Gait Details: pt able to maintain L LE PWB, limited by pain and endurance Gait velocity: slow Gait velocity interpretation: Below normal speed for age/gender    ADL: ADL Overall ADL's : Needs assistance/impaired Eating/Feeding: Set up, Sitting Grooming: Oral care, Set up, Sitting Upper Body Bathing: Set up, Sitting Lower Body Bathing: Minimal assistance, Sit to/from stand Upper Body Dressing : Set up, Sitting Lower Body Dressing: Minimal assistance, Sit to/from stand Lower Body Dressing Details (indicate cue type and reason): Adjusted socks and demonstrated good ROM and motivation Toilet Transfer: RW, Moderate assistance (Simulated to recliner) Functional mobility during ADLs: Rolling walker, Moderate assistance, Cueing for sequencing, Cueing for safety General ADL Comments: Pt highly motivated and willing to work with OT.   Cognition: Cognition Overall Cognitive Status: Within Functional Limits for tasks assessed Orientation Level: Oriented X4 Cognition Arousal/Alertness: Awake/alert Behavior During Therapy: WFL for tasks assessed/performed Overall Cognitive Status: Within Functional Limits for tasks assessed  Physical Exam: Blood pressure (!) 106/55, pulse (!) 56,  temperature 97.7 F (36.5 C), temperature source Oral, resp. rate 16, SpO2 93 %. Physical Exam  Vitals reviewed. Constitutional: He is oriented to person, place, and time. He appears well-developed.  HENT:  Head: Normocephalic.  Eyes: EOM are normal.  Neck: Normal range of motion. Neck supple. No thyromegaly present.  Cardiovascular: Normal rate and regular rhythm.   Respiratory: Effort normal and breath sounds normal. No respiratory distress.  GI: Soft. Bowel sounds are normal. He exhibits no distension.  Musculoskeletal:  Left thigh with surrounding edema. Tender to palpation. 1+ distal edema  Neurological: He is alert and oriented to person, place, and time. No cranial nerve deficit. Coordination normal.  5/5 UE motor. RLE 4/5 prox. LLE limited by pain. Sensory exam intact.   Psychiatric: He has a normal mood and affect. His behavior is normal.  Skin. Surgical site clean and dry appropriately tender    Results for orders placed or performed during the hospital encounter of 03/29/17 (from the past 48 hour(s))  CBC     Status: Abnormal   Collection Time: 03/31/17  4:55 AM  Result Value Ref Range   WBC 13.3 (H) 4.0 - 10.5 K/uL   RBC 3.22 (L) 4.22 - 5.81 MIL/uL   Hemoglobin 9.9 (L) 13.0 - 17.0 g/dL   HCT 30.2 (L) 39.0 - 52.0 %   MCV 93.8 78.0 - 100.0 fL   MCH 30.7 26.0 - 34.0 pg   MCHC 32.8 30.0 - 36.0 g/dL   RDW 12.4 11.5 - 15.5 %   Platelets 129 (L) 150 - 400 K/uL  Basic  metabolic panel     Status: Abnormal   Collection Time: 03/31/17  4:55 AM  Result Value Ref Range   Sodium 137 135 - 145 mmol/L   Potassium 4.2 3.5 - 5.1 mmol/L   Chloride 105 101 - 111 mmol/L   CO2 26 22 - 32 mmol/L   Glucose, Bld 117 (H) 65 - 99 mg/dL   BUN 15 6 - 20 mg/dL   Creatinine, Ser 0.99 0.61 - 1.24 mg/dL   Calcium 8.3 (L) 8.9 - 10.3 mg/dL   GFR calc non Af Amer >60 >60 mL/min   GFR calc Af Amer >60 >60 mL/min    Comment: (NOTE) The eGFR has been calculated using the CKD EPI equation. This  calculation has not been validated in all clinical situations. eGFR's persistently <60 mL/min signify possible Chronic Kidney Disease.    Anion gap 6 5 - 15  Protime-INR     Status: Abnormal   Collection Time: 03/31/17  4:55 AM  Result Value Ref Range   Prothrombin Time 15.3 (H) 11.4 - 15.2 seconds   INR 1.20   CBC     Status: Abnormal   Collection Time: 04/01/17  5:58 AM  Result Value Ref Range   WBC 9.1 4.0 - 10.5 K/uL   RBC 2.93 (L) 4.22 - 5.81 MIL/uL   Hemoglobin 9.1 (L) 13.0 - 17.0 g/dL   HCT 27.4 (L) 39.0 - 52.0 %   MCV 93.5 78.0 - 100.0 fL   MCH 31.1 26.0 - 34.0 pg   MCHC 33.2 30.0 - 36.0 g/dL   RDW 12.6 11.5 - 15.5 %   Platelets 129 (L) 150 - 400 K/uL  Basic metabolic panel     Status: Abnormal   Collection Time: 04/01/17  5:58 AM  Result Value Ref Range   Sodium 137 135 - 145 mmol/L   Potassium 4.0 3.5 - 5.1 mmol/L   Chloride 100 (L) 101 - 111 mmol/L   CO2 29 22 - 32 mmol/L   Glucose, Bld 93 65 - 99 mg/dL   BUN 16 6 - 20 mg/dL   Creatinine, Ser 1.19 0.61 - 1.24 mg/dL   Calcium 8.3 (L) 8.9 - 10.3 mg/dL   GFR calc non Af Amer 55 (L) >60 mL/min   GFR calc Af Amer >60 >60 mL/min    Comment: (NOTE) The eGFR has been calculated using the CKD EPI equation. This calculation has not been validated in all clinical situations. eGFR's persistently <60 mL/min signify possible Chronic Kidney Disease.    Anion gap 8 5 - 15  Protime-INR     Status: Abnormal   Collection Time: 04/01/17  5:58 AM  Result Value Ref Range   Prothrombin Time 16.3 (H) 11.4 - 15.2 seconds   INR 1.30    Pelvis Portable  Result Date: 03/30/2017 CLINICAL DATA:  Status post fixation of a left intertrochanteric fracture today. The patient suffered a fracture and a fall 03/29/2017. Initial encounter. EXAM: PORTABLE PELVIS 1-2 VIEWS COMPARISON:  Plain films left hip 03/29/2017. Intraoperative imaging today. FINDINGS: Long intramedullary nail with 2 hip screws is in place. Hardware is intact. There is  improved position and alignment of the patient's intertrochanteric fracture. No acute abnormality. IMPRESSION: Status post fixation of a left intertrochanteric fracture. No acute finding. Electronically Signed   By: Inge Rise M.D.   On: 03/30/2017 14:57   Dg C-arm 1-60 Min  Result Date: 03/30/2017 CLINICAL DATA:  ORIF left femur fracture. EXAM: DG C-ARM 61-120 MIN; LEFT FEMUR  2 VIEWS COMPARISON:  03/29/2017 FINDINGS: Four intraoperative spot films of the left femur are submitted postoperatively for interpretation. Placement of an intramedullary nail and screws noted traversing a intertrochanteric left femur fracture, in near-anatomic alignment and position. No definite complicating features noted. IMPRESSION: ORIF proximal left femur fracture, in near-anatomic alignment and position. Electronically Signed   By: Margarette Canada M.D.   On: 03/30/2017 13:05   Dg Femur Min 2 Views Left  Result Date: 03/30/2017 CLINICAL DATA:  ORIF left femur fracture. EXAM: DG C-ARM 61-120 MIN; LEFT FEMUR 2 VIEWS COMPARISON:  03/29/2017 FINDINGS: Four intraoperative spot films of the left femur are submitted postoperatively for interpretation. Placement of an intramedullary nail and screws noted traversing a intertrochanteric left femur fracture, in near-anatomic alignment and position. No definite complicating features noted. IMPRESSION: ORIF proximal left femur fracture, in near-anatomic alignment and position. Electronically Signed   By: Margarette Canada M.D.   On: 03/30/2017 13:05       Medical Problem List and Plan: 1.  Decreased functional mobility secondary to left intertrochanteric hip fracture status post IM nailing.   -admit to inpatient rehab  -Partial weight bearing 25 pounds 2.  DVT Prophylaxis/Anticoagulation: Xarelto. Check vascular study 3. Pain Management: Hydrocodone and Robaxin as needed 4. Mood: Provide emotional support 5. Neuropsych: This patient is capable of making decisions on his own  behalf. 6. Skin/Wound Care: Routine skin checks 7. Fluids/Electrolytes/Nutrition: Routine I&O with follow-up chemistries 8. Acute blood loss anemia. Follow-up CBC 9. Hypertension. Toprol-XL 50 mg daily. Patient also on lisinopril 5 mg daily prior to admission. Resume as needed 10. Hyperlipidemia. Pravachol   Post Admission Physician Evaluation: 1. Functional deficits secondary  to left IT hip fx. 2. Patient is admitted to receive collaborative, interdisciplinary care between the physiatrist, rehab nursing staff, and therapy team. 3. Patient's level of medical complexity and substantial therapy needs in context of that medical necessity cannot be provided at a lesser intensity of care such as a SNF. 4. Patient has experienced substantial functional loss from his/her baseline which was documented above under the "Functional History" and "Functional Status" headings.  Judging by the patient's diagnosis, physical exam, and functional history, the patient has potential for functional progress which will result in measurable gains while on inpatient rehab.  These gains will be of substantial and practical use upon discharge  in facilitating mobility and self-care at the household level. 5. Physiatrist will provide 24 hour management of medical needs as well as oversight of the therapy plan/treatment and provide guidance as appropriate regarding the interaction of the two. 6. The Preadmission Screening has been reviewed and patient status is unchanged unless otherwise stated above. 7. 24 hour rehab nursing will assist with bladder management, bowel management, safety, skin/wound care, disease management, medication administration, pain management and patient education  and help integrate therapy concepts, techniques,education, etc. 8. PT will assess and treat for/with: Lower extremity strength, range of motion, stamina, balance, functional mobility, safety, adaptive techniques and equipment, pain mgt, ortho  precautions .   Goals are: supervision to min assist goals. 9. OT will assess and treat for/with: ADL's, functional mobility, safety, upper extremity strength, adaptive techniques and equipment, ortho precautions, pain control, community reintegration.   Goals are: supervision to min assist. Therapy may proceed with showering this patient. 10. SLP will assess and treat for/with: n/a.  Goals are: n/a. 11. Case Management and Social Worker will assess and treat for psychological issues and discharge planning. 12. Team conference will be held weekly  to assess progress toward goals and to determine barriers to discharge. 13. Patient will receive at least 3 hours of therapy per day at least 5 days per week. 14. ELOS: 13-16 days       15. Prognosis:  excellent     Meredith Staggers, MD, [facility] 04/01/2017  Cathlyn Parsons., PA-C 04/01/2017

## 2017-04-01 NOTE — Progress Notes (Signed)
Will DC to CIR today

## 2017-04-01 NOTE — Progress Notes (Signed)
OT Cancellation    04/01/17 1200  OT Visit Information  Last OT Received On 04/01/17  Reason Eval/Treat Not Completed Other (comment) (Pt discharging to CIR today. All further OT needs may be met at inpatient rehab. Thank you!)   Loretha Brasil, OTR/L [phone]

## 2017-04-01 NOTE — Progress Notes (Signed)
Rehab admissions - I met with patient.  He would like to admit to acute inpatient rehab.  He is medically ready today.  Bed available and will admit to acute inpatient rehab today.  Call me for questions.  #[phone]

## 2017-04-01 NOTE — H&P (Signed)
Physical Medicine and Rehabilitation Admission H&P       Chief Complaint  Patient presents with  . Fall  . Hip Pain  . Ankle Pain  . Knee Pain  : HPI: [name] a 83 y.o.right handed malewith history of hypertension, CKD stage II and chronic leg edema. Per chart review and patient, patient independent living alone prior to admission. He has a supportive daughter lives close by. 1 level home with 2 steps to entry. Presented 03/29/2017 after a fall of which he was down for extended time. No loss of consciousness. X-rays and imaging revealed left intertrochanteric hip fracture. Underwent IM nailing 03/30/2017 per Dr. Marlou Sa. Partial weightbearing left lower extremity of 25 pounds. Acute blood loss anemia 9.1 and monitored. Maintained on Xareltofor DVT prophylaxis. Physical and occupational therapy evaluations completed with recommendations of physical medicine rehabilitation consult. Patient was admitted for a comprehensive rehabilitation program  Review of Systems  Constitutional: Negative for chills and fever.  HENT: Negative for hearing loss.   Eyes: Negative for blurred vision and double vision.  Respiratory: Negative for shortness of breath.   Cardiovascular: Positive for leg swelling. Negative for chest pain and palpitations.  Gastrointestinal: Positive for constipation. Negative for nausea.  Genitourinary: Positive for urgency. Negative for hematuria.  Musculoskeletal: Positive for falls and myalgias.  Skin: Negative for rash.  Neurological: Negative for seizures.  All other systems reviewed and are negative.      Past Medical History:  Diagnosis Date  . Allergy    seasonal  fall  . Cancer ([place])    Basal cell carcinoma; multiple  . Eczema   . Hyperlipidemia   . Hypertension         Past Surgical History:  Procedure Laterality Date  . CATARACT EXTRACTION, BILATERAL  06/29/2015  . INTRAMEDULLARY (IM) NAIL INTERTROCHANTERIC Left 03/30/2017   Procedure:  INTRAMEDULLARY (IM) NAIL INTERTROCHANTRIC;  Surgeon: [name], MD;  Location: [facility];  Service: Orthopedics;  Laterality: Left;  . MOHS SURGERY    . TONSILLECTOMY  1940 maybe        Family History  Problem Relation Age of Onset  . Arthritis Mother   . Heart disease Mother 48  . Hypertension Father    Social History:  reports that he has never smoked. He has never used smokeless tobacco. He reports that he does not drink alcohol or use drugs. Allergies: No Known Allergies       Medications Prior to Admission  Medication Sig Dispense Refill  . albuterol (PROVENTIL HFA;VENTOLIN HFA) 108 (90 Base) MCG/ACT inhaler Inhale 2 puffs into the lungs every 6 (six) hours as needed for wheezing or shortness of breath (cough, shortness of breath or wheezing.). 1 Inhaler 3  . lisinopril (PRINIVIL,ZESTRIL) 5 MG tablet Take 1 tablet (5 mg total) by mouth daily. 90 tablet 3  . metoprolol succinate (TOPROL-XL) 50 MG 24 hr tablet Take 1 tablet (50 mg total) by mouth daily. 90 tablet 3  . pravastatin (PRAVACHOL) 40 MG tablet TAKE 1 TABLET ONCE DAILY. 90 tablet 3  . triamcinolone cream (KENALOG) 0.1 % Apply topically 2 (two) times daily. APPLY TWICE DAILY.  1:1 RATIO PLEASE 454 g 0  . furosemide (LASIX) 20 MG tablet Take 1 tablet (20 mg total) by mouth daily. (Patient not taking: Reported on 03/29/2017) 90 tablet 1    Home: [place] expects to be discharged to:: Unsure Living Arrangements: Alone Available Help at Discharge: Family, Available PRN/intermittently (Daughter can come stay PRN)  Type of Home: House Home Access: Stairs to enter [employer] of Steps: 2 Additional Comments: pt lives alone and reports daughter lives down the street. 1 story home wiht 2 steps to enter with rail. Pt reports he would have to ask dtr if she could stay with him   Functional History: Prior Function Level of Independence: Independent Comments: ADLs, IADLs, and very  active  Functional Status:  Mobility: Bed Mobility Overal bed mobility: Needs Assistance Bed Mobility: Supine to Sit Supine to sit: Mod assist, +2 for physical assistance General bed mobility comments: In recliner upon arrival and requested to stay in recliner at end of session Transfers Overall transfer level: Needs assistance Equipment used: Rolling walker (2 wheeled) Transfers: Sit to/from Stand Sit to Stand: Mod assist General transfer comment: v/c's to push up from bed, modA for initial power up, minA once in standing Ambulation/Gait Ambulation/Gait assistance: Min assist, +2 safety/equipment (chair follow) Ambulation Distance (Feet): 5 Feet Assistive device: Rolling walker (2 wheeled) Gait Pattern/deviations: Step-to pattern, Antalgic General Gait Details: pt able to maintain L LE PWB, limited by pain and endurance Gait velocity: slow Gait velocity interpretation: Below normal speed for age/gender  ADL: ADL Overall ADL's : Needs assistance/impaired Eating/Feeding: Set up, Sitting Grooming: Oral care, Set up, Sitting Upper Body Bathing: Set up, Sitting Lower Body Bathing: Minimal assistance, Sit to/from stand Upper Body Dressing : Set up, Sitting Lower Body Dressing: Minimal assistance, Sit to/from stand Lower Body Dressing Details (indicate cue type and reason): Adjusted socks and demonstrated good ROM and motivation Toilet Transfer: RW, Moderate assistance (Simulated to recliner) Functional mobility during ADLs: Rolling walker, Moderate assistance, Cueing for sequencing, Cueing for safety General ADL Comments: Pt highly motivated and willing to work with OT.   Cognition: Cognition Overall Cognitive Status: Within Functional Limits for tasks assessed Orientation Level: Oriented X4 Cognition Arousal/Alertness: Awake/alert Behavior During Therapy: WFL for tasks assessed/performed Overall Cognitive Status: Within Functional Limits for tasks assessed  Physical  Exam: Blood pressure (!) 106/55, pulse (!) 56, temperature 97.7 F (36.5 C), temperature source Oral, resp. rate 16, SpO2 93 %. Physical Exam  Vitals reviewed. Constitutional: He is oriented to person, place, and time. He appears well-developed.  HENT:  Head: Normocephalic.  Eyes: EOM are normal.  Neck: Normal range of motion. Neck supple. No thyromegaly present.  Cardiovascular: Normal rate and regular rhythm.   Respiratory: Effort normal and breath sounds normal. No respiratory distress.  GI: Soft. Bowel sounds are normal. He exhibits no distension.  Musculoskeletal:  Left thigh with surrounding edema. Tender to palpation. 1+ distal edema  Neurological: He is alert and oriented to person, place, and time. No cranial nerve deficit. Coordination normal.  5/5 UE motor. RLE 4/5 prox. LLE limited by pain. Sensory exam intact.   Psychiatric: He has a normal mood and affect. His behavior is normal.  Skin. Surgical site clean and dry appropriately tender    Lab Results Last 48 Hours        Results for orders placed or performed during the hospital encounter of 03/29/17 (from the past 48 hour(s))  CBC     Status: Abnormal   Collection Time: 03/31/17  4:55 AM  Result Value Ref Range   WBC 13.3 (H) 4.0 - 10.5 K/uL   RBC 3.22 (L) 4.22 - 5.81 MIL/uL   Hemoglobin 9.9 (L) 13.0 - 17.0 g/dL   HCT 30.2 (L) 39.0 - 52.0 %   MCV 93.8 78.0 - 100.0 fL   MCH 30.7 26.0 -  34.0 pg   MCHC 32.8 30.0 - 36.0 g/dL   RDW 12.4 11.5 - 15.5 %   Platelets 129 (L) 150 - 400 K/uL  Basic metabolic panel     Status: Abnormal   Collection Time: 03/31/17  4:55 AM  Result Value Ref Range   Sodium 137 135 - 145 mmol/L   Potassium 4.2 3.5 - 5.1 mmol/L   Chloride 105 101 - 111 mmol/L   CO2 26 22 - 32 mmol/L   Glucose, Bld 117 (H) 65 - 99 mg/dL   BUN 15 6 - 20 mg/dL   Creatinine, Ser 0.99 0.61 - 1.24 mg/dL   Calcium 8.3 (L) 8.9 - 10.3 mg/dL   GFR calc non Af Amer >60 >60 mL/min   GFR calc Af  Amer >60 >60 mL/min    Comment: (NOTE) The eGFR has been calculated using the CKD EPI equation. This calculation has not been validated in all clinical situations. eGFR's persistently <60 mL/min signify possible Chronic Kidney Disease.    Anion gap 6 5 - 15  Protime-INR     Status: Abnormal   Collection Time: 03/31/17  4:55 AM  Result Value Ref Range   Prothrombin Time 15.3 (H) 11.4 - 15.2 seconds   INR 1.20   CBC     Status: Abnormal   Collection Time: 04/01/17  5:58 AM  Result Value Ref Range   WBC 9.1 4.0 - 10.5 K/uL   RBC 2.93 (L) 4.22 - 5.81 MIL/uL   Hemoglobin 9.1 (L) 13.0 - 17.0 g/dL   HCT 27.4 (L) 39.0 - 52.0 %   MCV 93.5 78.0 - 100.0 fL   MCH 31.1 26.0 - 34.0 pg   MCHC 33.2 30.0 - 36.0 g/dL   RDW 12.6 11.5 - 15.5 %   Platelets 129 (L) 150 - 400 K/uL  Basic metabolic panel     Status: Abnormal   Collection Time: 04/01/17  5:58 AM  Result Value Ref Range   Sodium 137 135 - 145 mmol/L   Potassium 4.0 3.5 - 5.1 mmol/L   Chloride 100 (L) 101 - 111 mmol/L   CO2 29 22 - 32 mmol/L   Glucose, Bld 93 65 - 99 mg/dL   BUN 16 6 - 20 mg/dL   Creatinine, Ser 1.19 0.61 - 1.24 mg/dL   Calcium 8.3 (L) 8.9 - 10.3 mg/dL   GFR calc non Af Amer 55 (L) >60 mL/min   GFR calc Af Amer >60 >60 mL/min    Comment: (NOTE) The eGFR has been calculated using the CKD EPI equation. This calculation has not been validated in all clinical situations. eGFR's persistently <60 mL/min signify possible Chronic Kidney Disease.    Anion gap 8 5 - 15  Protime-INR     Status: Abnormal   Collection Time: 04/01/17  5:58 AM  Result Value Ref Range   Prothrombin Time 16.3 (H) 11.4 - 15.2 seconds   INR 1.30       Imaging Results (Last 48 hours)  Pelvis Portable  Result Date: 03/30/2017 CLINICAL DATA:  Status post fixation of a left intertrochanteric fracture today. The patient suffered a fracture and a fall 03/29/2017. Initial encounter. EXAM: PORTABLE PELVIS 1-2  VIEWS COMPARISON:  Plain films left hip 03/29/2017. Intraoperative imaging today. FINDINGS: Long intramedullary nail with 2 hip screws is in place. Hardware is intact. There is improved position and alignment of the patient's intertrochanteric fracture. No acute abnormality. IMPRESSION: Status post fixation of a left intertrochanteric fracture. No acute finding.  Electronically Signed   By: Inge Rise M.D.   On: 03/30/2017 14:57   Dg C-arm 1-60 Min  Result Date: 03/30/2017 CLINICAL DATA:  ORIF left femur fracture. EXAM: DG C-ARM 61-120 MIN; LEFT FEMUR 2 VIEWS COMPARISON:  03/29/2017 FINDINGS: Four intraoperative spot films of the left femur are submitted postoperatively for interpretation. Placement of an intramedullary nail and screws noted traversing a intertrochanteric left femur fracture, in near-anatomic alignment and position. No definite complicating features noted. IMPRESSION: ORIF proximal left femur fracture, in near-anatomic alignment and position. Electronically Signed   By: Margarette Canada M.D.   On: 03/30/2017 13:05   Dg Femur Min 2 Views Left  Result Date: 03/30/2017 CLINICAL DATA:  ORIF left femur fracture. EXAM: DG C-ARM 61-120 MIN; LEFT FEMUR 2 VIEWS COMPARISON:  03/29/2017 FINDINGS: Four intraoperative spot films of the left femur are submitted postoperatively for interpretation. Placement of an intramedullary nail and screws noted traversing a intertrochanteric left femur fracture, in near-anatomic alignment and position. No definite complicating features noted. IMPRESSION: ORIF proximal left femur fracture, in near-anatomic alignment and position. Electronically Signed   By: Margarette Canada M.D.   On: 03/30/2017 13:05        Medical Problem List and Plan: 1.  Decreased functional mobility secondary to left intertrochanteric hip fracture status post IM nailing.              -admit to inpatient rehab             -Partial weight bearing 25 pounds 2.  DVT  Prophylaxis/Anticoagulation: Xarelto. Check vascular study 3. Pain Management: Hydrocodone and Robaxin as needed 4. Mood: Provide emotional support 5. Neuropsych: This patient is capable of making decisions on his own behalf. 6. Skin/Wound Care: Routine skin checks 7. Fluids/Electrolytes/Nutrition: Routine I&O with follow-up chemistries 8. Acute blood loss anemia. Follow-up CBC 9. Hypertension. Toprol-XL 50 mg daily. Patient also on lisinopril 5 mg daily prior to admission. Resume as needed 10. Hyperlipidemia. Pravachol   Post Admission Physician Evaluation: 1. Functional deficits secondary  to left IT hip fx. 2. Patient is admitted to receive collaborative, interdisciplinary care between the physiatrist, rehab nursing staff, and therapy team. 3. Patient's level of medical complexity and substantial therapy needs in context of that medical necessity cannot be provided at a lesser intensity of care such as a SNF. 4. Patient has experienced substantial functional loss from his/her baseline which was documented above under the "Functional History" and "Functional Status" headings.  Judging by the patient's diagnosis, physical exam, and functional history, the patient has potential for functional progress which will result in measurable gains while on inpatient rehab.  These gains will be of substantial and practical use upon discharge  in facilitating mobility and self-care at the household level. 5. Physiatrist will provide 24 hour management of medical needs as well as oversight of the therapy plan/treatment and provide guidance as appropriate regarding the interaction of the two. 6. The Preadmission Screening has been reviewed and patient status is unchanged unless otherwise stated above. 7. 24 hour rehab nursing will assist with bladder management, bowel management, safety, skin/wound care, disease management, medication administration, pain management and patient education  and help integrate  therapy concepts, techniques,education, etc. 8. PT will assess and treat for/with: Lower extremity strength, range of motion, stamina, balance, functional mobility, safety, adaptive techniques and equipment, pain mgt, ortho precautions .   Goals are: supervision to min assist goals. 9. OT will assess and treat for/with: ADL's, functional mobility, safety, upper extremity  strength, adaptive techniques and equipment, ortho precautions, pain control, community reintegration.   Goals are: supervision to min assist. Therapy may proceed with showering this patient. 10. SLP will assess and treat for/with: n/a.  Goals are: n/a. 11. Case Management and Social Worker will assess and treat for psychological issues and discharge planning. 12. Team conference will be held weekly to assess progress toward goals and to determine barriers to discharge. 13. Patient will receive at least 3 hours of therapy per day at least 5 days per week. 14. ELOS: 13-16 days       15. Prognosis:  excellent     Meredith Staggers, MD, [facility] 04/01/2017  Cathlyn Parsons., PA-C 04/01/2017

## 2017-04-01 NOTE — Progress Notes (Addendum)
Patient stable Less pain with left leg passive range of motion Plan for rehabilitation today Touchdown weightbearing for transfers left lower extremity okay We will advance weightbearing in 10 days after first postop visit and radiographs On Xarelto for DVT prophylaxis

## 2017-04-01 NOTE — Progress Notes (Signed)
Benefit check sent for [facility]

## 2017-04-02 ENCOUNTER — Inpatient Hospital Stay (HOSPITAL_COMMUNITY): Payer: Medicare Other | Admitting: Occupational Therapy

## 2017-04-02 ENCOUNTER — Inpatient Hospital Stay (HOSPITAL_COMMUNITY): Payer: Medicare Other | Admitting: Physical Therapy

## 2017-04-02 ENCOUNTER — Encounter (HOSPITAL_COMMUNITY): Payer: Medicare Other

## 2017-04-02 DIAGNOSIS — R269 Unspecified abnormalities of gait and mobility: Secondary | ICD-10-CM

## 2017-04-02 DIAGNOSIS — E8809 Other disorders of plasma-protein metabolism, not elsewhere classified: Secondary | ICD-10-CM

## 2017-04-02 DIAGNOSIS — E46 Unspecified protein-calorie malnutrition: Secondary | ICD-10-CM

## 2017-04-02 DIAGNOSIS — G8918 Other acute postprocedural pain: Secondary | ICD-10-CM

## 2017-04-02 DIAGNOSIS — S72145S Nondisplaced intertrochanteric fracture of left femur, sequela: Secondary | ICD-10-CM

## 2017-04-02 DIAGNOSIS — D62 Acute posthemorrhagic anemia: Secondary | ICD-10-CM

## 2017-04-02 LAB — COMPREHENSIVE METABOLIC PANEL
ALT: 12 U/L — ABNORMAL LOW (ref 17–63)
ANION GAP: 8 (ref 5–15)
AST: 49 U/L — ABNORMAL HIGH (ref 15–41)
Albumin: 2.5 g/dL — ABNORMAL LOW (ref 3.5–5.0)
Alkaline Phosphatase: 53 U/L (ref 38–126)
BUN: 15 mg/dL (ref 6–20)
CO2: 27 mmol/L (ref 22–32)
Calcium: 8.2 mg/dL — ABNORMAL LOW (ref 8.9–10.3)
Chloride: 100 mmol/L — ABNORMAL LOW (ref 101–111)
Creatinine, Ser: 1.03 mg/dL (ref 0.61–1.24)
GFR calc non Af Amer: >60 mL/min (ref >60)
Glucose, Bld: 103 mg/dL — ABNORMAL HIGH (ref 65–99)
POTASSIUM: 3.9 mmol/L (ref 3.5–5.1)
SODIUM: 135 mmol/L (ref 135–145)
Total Bilirubin: 1 mg/dL (ref 0.3–1.2)
Total Protein: 5.3 g/dL — ABNORMAL LOW (ref 6.5–8.1)

## 2017-04-02 LAB — CBC WITH DIFFERENTIAL/PLATELET
Basophils Absolute: 0 10*3/uL (ref 0.0–0.1)
Basophils Relative: 0 %
EOS ABS: 0.2 10*3/uL (ref 0.0–0.7)
EOS PCT: 3 %
HCT: 28.6 % — ABNORMAL LOW (ref 39.0–52.0)
Hemoglobin: 9.3 g/dL — ABNORMAL LOW (ref 13.0–17.0)
LYMPHS ABS: 1.8 10*3/uL (ref 0.7–4.0)
Lymphocytes Relative: 20 %
MCH: 30.7 pg (ref 26.0–34.0)
MCHC: 32.5 g/dL (ref 30.0–36.0)
MCV: 94.4 fL (ref 78.0–100.0)
MONOS PCT: 9 %
Monocytes Absolute: 0.8 10*3/uL (ref 0.1–1.0)
Neutro Abs: 6 10*3/uL (ref 1.7–7.7)
Neutrophils Relative %: 68 %
PLATELETS: 161 10*3/uL (ref 150–400)
RBC: 3.03 MIL/uL — AB (ref 4.22–5.81)
RDW: 12.7 % (ref 11.5–15.5)
WBC: 8.8 10*3/uL (ref 4.0–10.5)

## 2017-04-02 MED ORDER — PRO-STAT SUGAR FREE PO LIQD
30.0000 mL | Freq: Two times a day (BID) | ORAL | Status: DC
  Administered 2017-04-02 – 2017-04-15 (×27): 30 mL via ORAL
  Filled 2017-04-02 (×27): qty 30

## 2017-04-02 MED ORDER — SENNOSIDES-DOCUSATE SODIUM 8.6-50 MG PO TABS
2.0000 | ORAL_TABLET | Freq: Every day | ORAL | Status: DC
  Administered 2017-04-03 – 2017-04-04 (×2): 2 via ORAL
  Filled 2017-04-02 (×2): qty 2

## 2017-04-02 MED ORDER — POLYETHYLENE GLYCOL 3350 17 G PO PACK
17.0000 g | PACK | Freq: Every day | ORAL | Status: DC
  Administered 2017-04-02 – 2017-04-04 (×3): 17 g via ORAL
  Filled 2017-04-02 (×2): qty 1

## 2017-04-02 NOTE — Progress Notes (Signed)
Social Work Assessment and Plan Social Work Assessment and Plan  Patient Details  Name: [name] MRN: [id number] Date of Birth: 1932-06-15  Today's Date: 04/02/2017  Problem List:  Patient Active Problem List   Diagnosis Date Noted  . Post-operative pain   . Hypoalbuminemia due to protein-calorie malnutrition ([place])   . Abnormality of gait   . Closed intertrochanteric fracture of left hip ([place]) 04/01/2017  . Acute blood loss anemia   . Leg edema   . Hip fracture ([place]) 03/30/2017  . Leukocytosis 03/29/2017  . Closed left femoral fracture ([place]) 03/29/2017  . Dehydration 03/29/2017  . Closed left hip fracture ([place]) 03/29/2017  . Mild intermittent asthma with acute exacerbation 01/29/2017  . Chronic renal insufficiency 09/22/2015  . Tachycardia 09/22/2015  . PVC (premature ventricular contraction) 09/22/2015  . Osteoporosis/osteopenia increased risk 06/03/2014  . Basal cell carcinoma of face 06/03/2014  . BPH (benign prostatic hypertrophy) 05/27/2013  . Eczema 04/23/2012  . HTN (hypertension) 04/23/2012  . Dyslipidemia 04/23/2012   Past Medical History:  Past Medical History:  Diagnosis Date  . Allergy    seasonal  fall  . Cancer ([place])    Basal cell carcinoma; multiple  . Eczema   . Hyperlipidemia   . Hypertension    Past Surgical History:  Past Surgical History:  Procedure Laterality Date  . CATARACT EXTRACTION, BILATERAL  06/29/2015  . INTRAMEDULLARY (IM) NAIL INTERTROCHANTERIC Left 03/30/2017   Procedure: INTRAMEDULLARY (IM) NAIL INTERTROCHANTRIC;  Surgeon: [name], MD;  Location: [facility];  Service: Orthopedics;  Laterality: Left;  . MOHS SURGERY    . TONSILLECTOMY  1940 maybe   Social History:  reports that he has never smoked. He has never used smokeless tobacco. He reports that he does not drink alcohol or use drugs.  Family / Support Systems Marital Status: Divorced Patient Roles: Parent, Other (Comment) (Ex-wife in town) Children: [name]  Cumm[place]-daughter [phone]  [phone] Other Supports: Son out of town Anticipated Caregiver: Daughter and ex-wife Ability/Limitations of Caregiver: Daughter is self employed and is fleixible with her schedule. Ex-wife can check on him Caregiver Availability: Intermittent Family Dynamics: Close knit with children and ex-wife, pt has been very independent and self sufficient. He prides himself on this and wants to get back to this level. He has a few friends and church members who visit and will check on him at home.  Social History Preferred language: English Religion:  Cultural Background: No issues Education: Secretary/administrator educated Read: Yes Write: Yes Employment Status: Retired Freight forwarder Issues: No issues Guardian/Conservator: None-according to MD pt is capable of making his own decisions while here.   Abuse/Neglect Physical Abuse: Denies Verbal Abuse: Denies Sexual Abuse: Denies Exploitation of patient/patient's resources: Denies Self-Neglect: Denies  Emotional Status Pt's affect, behavior adn adjustment status: Pt is motivated to recover and be able to return home independently, he does not want to burden his daughter due to she is busy. He feels once he can get the hang of this he will do well and be able to reach the goals he set for himself. Recent Psychosocial Issues: other health issues managed by PCP Pyschiatric History: No history deferred depression screen due to doing well and adjusting to his hip surgery. Will monitor and ask team for their input.  Substance Abuse History: No issues  Patient / Family Perceptions, Expectations & Goals Pt/Family understanding of illness & functional limitations: Pt and son can expalin his hip surgery and precautions-WB status. He does talk with the MD  and feels his questions have been answered and no concerns at this time. His son and daughter are involved and aware of what is happening here. Premorbid pt/family  roles/activities: Father, retiree, home owner, church member, friend, etc Anticipated changes in roles/activities/participation: resume Pt/family expectations/goals: Pt states: " I want to be able to take care of myself before I leave here."  Son states: " We will do what we can for him."  [employer]: None Premorbid Home Care/DME Agencies: None Transportation available at discharge: Daughter provides transportation and did prior to admission  Discharge Planning Living Arrangements: [place]: Children, Other relatives, Water engineer, Social worker community Type of Residence: Private residence Insurance Resources: [employer] Financial Resources: Day Referred: No Living Expenses: Own Money Management: Patient Does the patient have any problems obtaining your medications?: No Home Management: Pt and daughter helps with this Patient/Family Preliminary Plans: Return home with intermittent assist from daughter and ex-wife, pt will need to be mod/i to be able to be at home alone. Will await team's evaluations and work on a safe plan for him. As long as he can adhere to the [facility] then he should do well here. Social Work Anticipated Follow Up Needs: HH/OP  Clinical Impression Pleasant gentleman who is willing to work hard in therapies to achieve his goals of mod/i level. His son is here today to observe him in therapies and communicate back to his sister. Will work on a safe discharge plan for him. If he is unable to reach mod/i level will need to come up with an alternative plan. Await therapy team's goals.  Elease Hashimoto 04/02/2017, 12:55 PM

## 2017-04-02 NOTE — IPOC Note (Signed)
Overall Plan of Care [facility]) Patient Details Name: [name] MRN: [id number] DOB: January 27, 1932  Admitting Diagnosis: L IT HIP fracture  Hospital Problems: Principal Problem:   Closed intertrochanteric fracture of left hip ([place]) Active Problems:   Post-operative pain   Hypoalbuminemia due to protein-calorie malnutrition ([place])   Abnormality of gait     Functional Problem List: Nursing Safety, Pain, Skin Integrity  PT Balance, Endurance, Motor, Pain, Safety  OT Balance, Endurance, Pain, Safety  SLP    TR         Basic ADL's: OT Grooming, Bathing, Dressing, Toileting     Advanced  ADL's: OT Simple Meal Preparation     Transfers: PT Bed Mobility, Bed to Chair, Car, Furniture, Floor  OT Toilet, Metallurgist: PT Ambulation, Emergency planning/management officer, Stairs     Additional Impairments: OT None  SLP        TR      Anticipated Outcomes Item Anticipated Outcome  Self Feeding independent  Swallowing      Basic self-care  supervision  Toileting  supervision   Bathroom Transfers supervision  Bowel/Bladder  patient will be continent of bowel and bladder during stay here  Transfers  supervision with assistive device  Locomotion  supervision with assistive device  Communication     Cognition     Pain  patient pain will be less than 3 or pain free during stay here  Safety/Judgment   patient will be free from falls and follow the safey plan   Therapy Plan: PT Intensity: Minimum of 1-2 x/day ,45 to 90 minutes PT Frequency: 5 out of 7 days PT Duration Estimated Length of Stay: 14-16 days OT Intensity: Minimum of 1-2 x/day, 45 to 90 minutes OT Frequency: 5 out of 7 days OT Duration/Estimated Length of Stay: 12-14 days         Team Interventions: Nursing Interventions Patient/Family Education, Skin Care/Wound Management, Pain Management  PT interventions Ambulation/gait training, Training and development officer, Community reintegration, Discharge planning,  DME/adaptive equipment instruction, Functional electrical stimulation, Functional mobility training, Neuromuscular re-education, Pain management, Patient/family education, Psychosocial support, Stair training, Therapeutic Activities, Therapeutic Exercise, UE/LE Strength taining/ROM, Wheelchair propulsion/positioning  OT Interventions Training and development officer, Cognitive remediation/compensation, Academic librarian, Discharge planning, Functional mobility training, DME/adaptive equipment instruction, Pain management, Patient/family education, UE/LE Strength taining/ROM, Therapeutic Exercise, Therapeutic Activities, Self Care/advanced ADL retraining  SLP Interventions    TR Interventions    SW/CM Interventions Psychosocial Support, Discharge Planning, Patient/Family Education    Team Discharge Planning: Destination: PT- (undetermined) ,OT- Home (If 24 hour can be arranged) , SLP-  Projected Follow-up: PT-Home health PT, OT-  24 hour supervision/assistance, SLP-  Projected Equipment Needs: PT-Rolling walker with 5" wheels, Wheelchair (measurements), Wheelchair cushion (measurements), OT- 3 in 1 bedside comode, To be determined, SLP-  Equipment Details: PT- , OT-  Patient/family involved in discharge planning: PT- Patient,  OT-Patient, SLP-   MD ELOS: 12-15 days. Medical Rehab Prognosis:  Good Assessment: 83 y.o. right handed male with history of hypertension, CKD stage II and chronic leg edema. Presented 03/29/2017 after a fall in which he was down for extended time. No loss of consciousness. X-rays and imaging revealed left intertrochanteric hip fracture. Underwent IM nailing 03/30/2017 per Dr. Marlou Sa. Partial weightbearing left lower extremity of 25 pounds. Acute blood loss anemia monitored. Maintained on Xarelto for DVT prophylaxis. Pt with resulting functional deficits with mobility, self-care, and endurance.  Will set goals for supervision with PT/OT.  See Team Conference Notes for weekly  updates to the plan of care

## 2017-04-02 NOTE — Progress Notes (Signed)
[facility] PHYSICAL MEDICINE & REHABILITATION     PROGRESS NOTE  Subjective/Complaints:  Pt seen laying in bed this AM.  He slept well overnight and is ready to begin therapies.  ROS: Denies CP, SOB, N/V/D.  Objective: Vital Signs: Blood pressure (!) 119/57, pulse 87, temperature 97.6 F (36.4 C), temperature source Oral, resp. rate 18, height 5\' 8"  (1.727 m), weight 69.6 kg (153 lb 8 oz), SpO2 99 %. No results found.  Recent Labs  04/01/17 0558 04/02/17 0621  WBC 9.1 8.8  HGB 9.1* 9.3*  HCT 27.4* 28.6*  PLT 129* 161    Recent Labs  04/01/17 0558 04/02/17 0621  NA 137 135  K 4.0 3.9  CL 100* 100*  GLUCOSE 93 103*  BUN 16 15  CREATININE 1.19 1.03  CALCIUM 8.3* 8.2*   CBG (last 3)  No results for input(s): GLUCAP in the last 72 hours.  Wt Readings from Last 3 Encounters:  04/01/17 69.6 kg (153 lb 8 oz)  04/01/17 67.1 kg (148 lb)  01/29/17 67.1 kg (148 lb)    Physical Exam:  BP (!) 119/57 (BP Location: Right Arm)   Pulse 87   Temp 97.6 F (36.4 C) (Oral)   Resp 18   Ht 5\' 8"  (1.727 m)   Wt 69.6 kg (153 lb 8 oz)   SpO2 99%   BMI 23.34 kg/m  Constitutional: He appears well-developed. NAD. HENT: Normocephalic. Atraumatic Eyes: EOMare normal. No discharge. Cardiovascular: Normal rateand regular rhythm. No JVD. Respiratory: Effort normaland breath sounds normal.  GI: Soft. Bowel sounds are normal.  Musculoskeletal:  LLE edema. Tender to palpation.  Neurological: He is alertand oriented 5/5 UE motor.  RLE 4/5 proximally, 5/5 distally.  LLE: HF 2/5,  KE 2+/5, ADF/PF 4+/5 Psychiatric: He has a normal mood and affect. His behavior is normal.  Skin. Surgical site clean and dry appropriately tender   Assessment/Plan: 1. Functional deficits secondary to Left intertrochanteric hip fracture which require 3+ hours per day of interdisciplinary therapy in a comprehensive inpatient rehab setting. Physiatrist is providing close team supervision and 24 hour  management of active medical problems listed below. Physiatrist and rehab team continue to assess barriers to discharge/monitor patient progress toward functional and medical goals.  Function:  Bathing Bathing position      Bathing parts      Bathing assist        Upper Body Dressing/Undressing Upper body dressing                    Upper body assist        Lower Body Dressing/Undressing Lower body dressing                                  Lower body assist        Toileting Toileting          Toileting assist     Transfers Chair/bed transfer             Locomotion Ambulation           Wheelchair          Cognition Comprehension    Expression    Social Interaction    Problem Solving    Memory      Medical Problem List and Plan: 1. Decreased functional mobilitysecondary to left intertrochanteric hip fracture status post IM nailing.   Partial weight bearing 25 pounds  Begin CIR 2. DVT Prophylaxis/Anticoagulation: Xarelto.   Vascular study pending 3. Pain Management: Hydrocodone and Robaxin as needed 4. Mood: Provide emotional support 5. Neuropsych: This patient iscapable of making decisions on hisown behalf. 6. Skin/Wound Care: Routine skin checks 7. Fluids/Electrolytes/Nutrition: Routine I&Os  BMP within acceptable range on 6/6 8.Acute blood loss anemia.   Hb 9.3 on 6/6 9.Hypertension. Toprol-XL 50 mg daily. Patient also on lisinopril 5 mg daily prior to admission. Resume as needed  Monitor with increased mobility 10.Hyperlipidemia. Pravachol 11. Hypoalbuminemia  Supplement initiated 6/6  LOS (Days) 1 A FACE TO FACE EVALUATION WAS PERFORMED  [name] 04/02/2017 8:51 AM

## 2017-04-02 NOTE — Progress Notes (Signed)
Social Work Evalynne Locurto, Eliezer Champagne Social Worker Signed   Patient Care Conference Date of Service: 04/02/2017  3:27 PM      Hide copied text Hover for attribution information Inpatient RehabilitationTeam Conference and Plan of Care Update Date: 04/02/2017   Time: 2:15 PM      Patient Name: [name]      Medical Record Number: [id number]  Date of Birth: 11-23-1931 Sex: Male         Room/Bed: 4M10C/4M10C-01 Payor Info: Payor: MEDICARE / Plan: MEDICARE PART A AND B / Product Type: *No Product type* /     Admitting Diagnosis: L IT HIP fracture  Admit Date/Time:  04/01/2017  4:07 PM Admission Comments: No comment available    Primary Diagnosis:  Closed intertrochanteric fracture of left hip (HCC) Principal Problem: Closed intertrochanteric fracture of left hip [facility])       Patient Active Problem List    Diagnosis Date Noted  . Post-operative pain    . Hypoalbuminemia due to protein-calorie malnutrition ([place])    . Abnormality of gait    . Closed intertrochanteric fracture of left hip ([place]) 04/01/2017  . Acute blood loss anemia    . Leg edema    . Hip fracture ([place]) 03/30/2017  . Leukocytosis 03/29/2017  . Closed left femoral fracture ([place]) 03/29/2017  . Dehydration 03/29/2017  . Closed left hip fracture ([place]) 03/29/2017  . Mild intermittent asthma with acute exacerbation 01/29/2017  . Chronic renal insufficiency 09/22/2015  . Tachycardia 09/22/2015  . PVC (premature ventricular contraction) 09/22/2015  . Osteoporosis/osteopenia increased risk 06/03/2014  . Basal cell carcinoma of face 06/03/2014  . BPH (benign prostatic hypertrophy) 05/27/2013  . Eczema 04/23/2012  . HTN (hypertension) 04/23/2012  . Dyslipidemia 04/23/2012      Expected Discharge Date: Expected Discharge Date: 04/15/17   Team Members Present: Physician leading conference: Dr. Delice Lesch Social Worker Present: Ovidio Kin, LCSW Nurse Present: Other (comment) Alda Lea) PT Present: Other (comment)  (ben Zaino-PT) OT Present: Clyda Greener, OT PPS Coordinator present : Daiva Nakayama, RN, CRRN       Current Status/Progress Goal Weekly Team Focus  Medical     Decreased functional mobility secondary to left intertrochanteric hip fracture status post IM nailing  Improve mobility, transfers, BP  See above   Bowel/Bladder     continent of B/B; LBM 6/1 per pt and per report; pt given sorbitol  maintain continence with min assist  assess for changes in continence q shift and prn   Swallow/Nutrition/ Hydration               ADL's     supervision for UB selfcare,          Mobility     max assist with bed mobility, transfers, ambulating 2 ft with rw and max assist  supervision level  transfers, ambulation, consistency with weightbearing, w/c propulsion.    Communication               Safety/Cognition/ Behavioral Observations             Pain     pain with activity; rolling from side to side; norco 1-2 tabs q6 prn  <3  assess for pain q shift and prn   Skin     2 aquacel dressings to L hip; eczema; brusing to left side of penis and left side of scrotum  skin free of infection and breakdown with min assist  assess skin q shift and prn     *  See Care Plan and progress notes for long and short-term goals.   Barriers to Discharge: Mobility, transfers, ABLA, HTN     Possible Resolutions to Barriers:  Therapies, follow labs, optimize BP meds     Discharge Planning/Teaching Needs:    Home with intermittent assist from daughter and ex-wife. Unsure if can provide 24 hr care at discharge.     Team Discussion:  Goals supervision level due to WB restrictions. Currently max assist level. MD-adjusting BP and working on pain and anemia issues. New eval today, so one day of therapies. Unsure if will go home from here or to a NH for short time at discharge.  Revisions to Treatment Plan:  New eval  DC 6/19    Continued Need for Acute Rehabilitation Level of Care: The patient requires daily medical  management by a physician with specialized training in physical medicine and rehabilitation for the following conditions: Daily direction of a multidisciplinary physical rehabilitation program to ensure safe treatment while eliciting the highest outcome that is of practical value to the patient.: Yes Daily medical management of patient stability for increased activity during participation in an intensive rehabilitation regime.: Yes Daily analysis of laboratory values and/or radiology reports with any subsequent need for medication adjustment of medical intervention for : Post surgical problems;Blood pressure problems;Other   Elease Hashimoto 04/02/2017, 3:27 PM       Patient ID: [name], male   DOB: [birth year], 83 y.o.   MRN: [id number]

## 2017-04-02 NOTE — Progress Notes (Signed)
Patient information reviewed and entered into eRehab system by Taryn Nave, RN, CRRN, PPS Coordinator.  Information including medical coding and functional independence measure will be reviewed and updated through discharge.     Per nursing patient was given "Data Collection Information Summary for Patients in Inpatient Rehabilitation Facilities with attached "Privacy Act Statement-Health Care Records" upon admission.  

## 2017-04-02 NOTE — Evaluation (Signed)
Occupational Therapy Assessment and Plan  Patient Details  Name: [name] MRN: [id number] Date of Birth: August 16, 1932  OT Diagnosis: acute pain, muscle weakness (generalized) and pain in joint Rehab Potential: Rehab Potential (ACUTE ONLY): Good ELOS: 12-14 days   Today's Date: 04/02/2017 OT Individual Time: 1300-1402 OT Individual Time Calculation (min): 62 min     Problem List:  Patient Active Problem List   Diagnosis Date Noted  . Post-operative pain   . Hypoalbuminemia due to protein-calorie malnutrition ([place])   . Abnormality of gait   . Closed intertrochanteric fracture of left hip ([place]) 04/01/2017  . Acute blood loss anemia   . Leg edema   . Hip fracture ([place]) 03/30/2017  . Leukocytosis 03/29/2017  . Closed left femoral fracture ([place]) 03/29/2017  . Dehydration 03/29/2017  . Closed left hip fracture ([place]) 03/29/2017  . Mild intermittent asthma with acute exacerbation 01/29/2017  . Chronic renal insufficiency 09/22/2015  . Tachycardia 09/22/2015  . PVC (premature ventricular contraction) 09/22/2015  . Osteoporosis/osteopenia increased risk 06/03/2014  . Basal cell carcinoma of face 06/03/2014  . BPH (benign prostatic hypertrophy) 05/27/2013  . Eczema 04/23/2012  . HTN (hypertension) 04/23/2012  . Dyslipidemia 04/23/2012    Past Medical History:  Past Medical History:  Diagnosis Date  . Allergy    seasonal  fall  . Cancer ([place])    Basal cell carcinoma; multiple  . Eczema   . Hyperlipidemia   . Hypertension    Past Surgical History:  Past Surgical History:  Procedure Laterality Date  . CATARACT EXTRACTION, BILATERAL  06/29/2015  . INTRAMEDULLARY (IM) NAIL INTERTROCHANTERIC Left 03/30/2017   Procedure: INTRAMEDULLARY (IM) NAIL INTERTROCHANTRIC;  Surgeon: [name], MD;  Location: [facility];  Service: Orthopedics;  Laterality: Left;  . MOHS SURGERY    . TONSILLECTOMY  1940 maybe    Assessment & Plan Clinical Impression: Patient is a 83 y.o. year old male  with recent admission to the hospital on 03/29/2017 after a fall of which he was down for extended time. No loss of consciousness. X-rays and imaging revealed left intertrochanteric hip fracture. Underwent IM nailing 03/30/2017 per Dr. Marlou Sa. Partial weightbearing left lower extremity of 25 pounds. Acute blood loss anemia 9.1and monitored.   Patient transferred to CIR on 04/01/2017 .    Patient currently requires max with basic self-care skills secondary to muscle weakness and decreased standing balance and decreased balance strategies.  Prior to hospitalization, patient could complete ADLs with independent .  Patient will benefit from skilled intervention to decrease level of assist with basic self-care skills and increase independence with basic self-care skills prior to discharge home with care partner.  Anticipate patient will require 24 hour supervision and follow up home health.  OT - End of Session Activity Tolerance: Improving Endurance Deficit: Yes OT Assessment Rehab Potential (ACUTE ONLY): Good Barriers to Discharge: Inaccessible home environment Barriers to Discharge Comments: Needs 24 hour supervision OT Patient demonstrates impairments in the following area(s): Balance;Endurance;Pain;Safety OT Basic ADL's Functional Problem(s): Grooming;Bathing;Dressing;Toileting OT Advanced ADL's Functional Problem(s): Simple Meal Preparation OT Transfers Functional Problem(s): Toilet;Tub/Shower OT Additional Impairment(s): None OT Plan OT Intensity: Minimum of 1-2 x/day, 45 to 90 minutes OT Frequency: 5 out of 7 days OT Duration/Estimated Length of Stay: 12-14 days OT Treatment/Interventions: Balance/vestibular training;Cognitive remediation/compensation;Community reintegration;Discharge planning;Functional mobility training;DME/adaptive equipment instruction;Pain management;Patient/family education;UE/LE Strength taining/ROM;Therapeutic Exercise;Therapeutic Activities;Self Care/advanced ADL  retraining OT Self Feeding Anticipated Outcome(s): independent OT Basic Self-Care Anticipated Outcome(s): supervision OT Toileting Anticipated Outcome(s): supervision OT Bathroom Transfers Anticipated  Outcome(s): supervision OT Recommendation Patient destination: Home (If 24 hour can be arranged) Follow Up Recommendations: 24 hour supervision/assistance Equipment Recommended: 3 in 1 bedside comode;To be determined   Skilled Therapeutic Intervention Began education on self care retraining sit to stand from the wheelchair.  Pt with no clothing this session but spoke with son who left at start of session and he is bringing clothing back.  Increased pain in the LLE with transitional movements.  Mod demonstrational cueing for positioning the LLE out in front of him prior to standing.  Max assist for sit to stand and for stand pivot with use of the RW for support.  Decreased ability to maintain weightbearing during stand pivot transfer as well.  Pt needing mod instructional cueing for thoroughness as he did not want to wash more than his face.  Educated pt on the need for 24 hour supervision at discharge and this will need to be arranged with family.  Pt left in wheelchair at end of session with safety belt in place and call button and phone in reach.    OT Evaluation Precautions/Restrictions  Precautions Precautions: Fall Restrictions Weight Bearing Restrictions: Yes LLE Weight Bearing: Partial weight bearing LLE Partial Weight Bearing Percentage or Pounds: 25%  Vital Signs Therapy Vitals Temp: 97.8 F (36.6 C) Temp Source: Oral Pulse Rate: 88 Resp: 17 BP: 136/60 Patient Position (if appropriate): Lying Oxygen Therapy SpO2: 100 % O2 Device: Not Delivered Pain Pain Assessment Pain Assessment: Faces Faces Pain Scale: Hurts little more Pain Type: Acute pain Pain Location: Leg Pain Orientation: Left Pain Descriptors / Indicators: Discomfort Pain Onset: With Activity Pain  Intervention(s): Repositioned Multiple Pain Sites: No Home Living/Prior Functioning Home Living Family/patient expects to be discharged to:: Private residence Living Arrangements: Alone Available Help at Discharge: Friend(s), Available PRN/intermittently Type of Home: House Home Access: Stairs to enter [employer] of Steps: 5-7 Entrance Stairs-Rails:  (both front (wide), none bace) Home Layout: One level Bathroom Shower/Tub: Chiropodist: Standard Bathroom Accessibility: No Additional Comments: Daughter lives 3 blocks away. Does work from home. Niece lives in town also.   Lives With: Alone IADL History Homemaking Responsibilities: Yes Meal Prep Responsibility: Primary Cleaning Responsibility: Secondary Current License: No Occupation: Retired Prior Function Level of Independence: Independent with homemaking with ambulation  Able to Take Stairs?: Reciprically Driving:  (never driven) Vocation: Retired Leisure: Hobbies-yes (Comment) Comments: has walker and cane at home.  ADL  See Function Section of chart for details  Vision Baseline Vision/History: No visual deficits Patient Visual Report: No change from baseline Eye Alignment: Within Functional Limits Perception  Perception: Within Functional Limits Praxis Praxis: Intact Cognition Overall Cognitive Status: Within Functional Limits for tasks assessed Arousal/Alertness: Awake/alert Orientation Level: Person;Place;Situation Person: Oriented Place: Oriented Situation: Oriented Year: 2018 Month: June Day of Week: Correct Memory: Appears intact Immediate Memory Recall: Sock;Blue;Bed Memory Recall: Sock;Blue;Bed Memory Recall Sock: Without Cue Memory Recall Blue: Without Cue Memory Recall Bed: Without Cue Attention: Sustained Focused Attention: Appears intact Awareness: Appears intact Problem Solving: Appears intact Safety/Judgment: Appears intact Sensation Sensation Light Touch:  Appears Intact Stereognosis: Appears Intact Hot/Cold: Appears Intact Proprioception: Appears Intact Coordination Gross Motor Movements are Fluid and Coordinated: Yes Fine Motor Movements are Fluid and Coordinated: Yes Motor  Motor Motor: Within Functional Limits Motor - Skilled Clinical Observations: LLE strength and motion are limited by reports of pain.  Mobility  Transfers Transfers: Sit to Stand;Stand to Sit Sit to Stand: 2: Max assist;With armrests;With upper extremity assist;From chair/3-in-1 Sit  to Stand Details: Manual facilitation for weight shifting Stand to Sit: 2: Max assist;With upper extremity assist;To chair/3-in-1 Stand to Sit Details (indicate cue type and reason): Manual facilitation for weight shifting  Trunk/Postural Assessment  Cervical Assessment Cervical Assessment: Exceptions to [facility] (slight cervical protraction) Thoracic Assessment Thoracic Assessment: Exceptions to [facility] (slight kyphosis) Lumbar Assessment Lumbar Assessment: Within Functional Limits Postural Control Postural Control: Within Functional Limits  Balance Balance Balance Assessed: Yes Static Sitting Balance Static Sitting - Balance Support: Feet supported Static Sitting - Level of Assistance: 5: Stand by assistance Dynamic Sitting Balance Dynamic Sitting - Balance Support: Right upper extremity supported Dynamic Sitting - Level of Assistance: 5: Stand by assistance Static Standing Balance Static Standing - Balance Support: Bilateral upper extremity supported Static Standing - Level of Assistance: 3: Mod assist Dynamic Standing Balance Dynamic Standing - Balance Support: Bilateral upper extremity supported;During functional activity Dynamic Standing - Level of Assistance: 2: Max assist Extremity/Trunk Assessment RUE Assessment RUE Assessment: Within Functional Limits (strength 4/5 throughout) LUE Assessment LUE Assessment: Within Functional Limits (strength 4/5 throughout)   See  Function Navigator for Current Functional Status.   Refer to Care Plan for Long Term Goals  Recommendations for other services: None    Discharge Criteria: Patient will be discharged from OT if patient refuses treatment 3 consecutive times without medical reason, if treatment goals not met, if there is a change in medical status, if patient makes no progress towards goals or if patient is discharged from hospital.  The above assessment, treatment plan, treatment alternatives and goals were discussed and mutually agreed upon: by patient  Sebrina Kessner OTR/L 04/02/2017, 4:59 PM

## 2017-04-02 NOTE — Care Management Note (Signed)
[facility] Individual Statement of Services  Patient Name:  [name]  Date:  04/02/2017  Welcome to the [facility].  Our goal is to provide you with an individualized program based on your diagnosis and situation, designed to meet your specific needs.  With this comprehensive rehabilitation program, you will be expected to participate in at least 3 hours of rehabilitation therapies Monday-Friday, with modified therapy programming on the weekends.  Your rehabilitation program will include the following services:  Physical Therapy (PT), Occupational Therapy (OT), 24 hour per day rehabilitation nursing, Case Management (Social Worker), Rehabilitation Medicine, Nutrition Services and Pharmacy Services  Weekly team conferences will be held on Wednesday to discuss your progress.  Your Social Worker will talk with you frequently to get your input and to update you on team discussions.  Team conferences with you and your family in attendance may also be held.  Expected length of stay: 12-14 days  Overall anticipated outcome: supervision level  Depending on your progress and recovery, your program may change. Your Social Worker will coordinate services and will keep you informed of any changes. Your Social Worker's name and contact numbers are listed  below.  The following services may also be recommended but are not provided by the [facility]:    [place] will be made to provide these services after discharge if needed.  Arrangements include referral to agencies that provide these services.  Your insurance has been verified to be:  Medicare Your primary doctor is:  Reginia Forts  Pertinent information will be shared with your doctor and your insurance company.  Social Worker:  Ovidio Kin, [place] or ([phone]  Information discussed with and  copy given to patient by: Elease Hashimoto, 04/02/2017, 9:50 AM

## 2017-04-02 NOTE — Patient Care Conference (Signed)
Inpatient RehabilitationTeam Conference and Plan of Care Update Date: 04/02/2017   Time: 2:15 PM    Patient Name: Ulus Hazen      Medical Record Number: [id number]  Date of Birth: 14-Jan-1932 Sex: Male         Room/Bed: 4M10C/4M10C-01 Payor Info: Payor: MEDICARE / Plan: MEDICARE PART A AND B / Product Type: *No Product type* /    Admitting Diagnosis: L IT HIP fracture  Admit Date/Time:  04/01/2017  4:07 PM Admission Comments: No comment available   Primary Diagnosis:  Closed intertrochanteric fracture of left hip (HCC) Principal Problem: Closed intertrochanteric fracture of left hip [facility])  Patient Active Problem List   Diagnosis Date Noted  . Post-operative pain   . Hypoalbuminemia due to protein-calorie malnutrition ([place])   . Abnormality of gait   . Closed intertrochanteric fracture of left hip ([place]) 04/01/2017  . Acute blood loss anemia   . Leg edema   . Hip fracture ([place]) 03/30/2017  . Leukocytosis 03/29/2017  . Closed left femoral fracture ([place]) 03/29/2017  . Dehydration 03/29/2017  . Closed left hip fracture ([place]) 03/29/2017  . Mild intermittent asthma with acute exacerbation 01/29/2017  . Chronic renal insufficiency 09/22/2015  . Tachycardia 09/22/2015  . PVC (premature ventricular contraction) 09/22/2015  . Osteoporosis/osteopenia increased risk 06/03/2014  . Basal cell carcinoma of face 06/03/2014  . BPH (benign prostatic hypertrophy) 05/27/2013  . Eczema 04/23/2012  . HTN (hypertension) 04/23/2012  . Dyslipidemia 04/23/2012    Expected Discharge Date: Expected Discharge Date: 04/15/17  Team Members Present: Physician leading conference: Dr. Delice Lesch Social Worker Present: Ovidio Kin, LCSW Nurse Present: Other (comment) Alda Lea) PT Present: Other (comment) (ben Zaino-PT) OT Present: Clyda Greener, OT PPS Coordinator present : Daiva Nakayama, RN, CRRN     Current Status/Progress Goal Weekly Team Focus  Medical   Decreased functional mobility secondary  to left intertrochanteric hip fracture status post IM nailing  Improve mobility, transfers, BP  See above   Bowel/Bladder   continent of B/B; LBM 6/1 per pt and per report; pt given sorbitol  maintain continence with min assist  assess for changes in continence q shift and prn   Swallow/Nutrition/ Hydration             ADL's   supervision for UB selfcare,          Mobility   max assist with bed mobility, transfers, ambulating 2 ft with rw and max assist  supervision level  transfers, ambulation, consistency with weightbearing, w/c propulsion.    Communication             Safety/Cognition/ Behavioral Observations            Pain   pain with activity; rolling from side to side; norco 1-2 tabs q6 prn  <3  assess for pain q shift and prn   Skin   2 aquacel dressings to L hip; eczema; brusing to left side of penis and left side of scrotum  skin free of infection and breakdown with min assist  assess skin q shift and prn      *See Care Plan and progress notes for long and short-term goals.  Barriers to Discharge: Mobility, transfers, ABLA, HTN    Possible Resolutions to Barriers:  Therapies, follow labs, optimize BP meds    Discharge Planning/Teaching Needs:    Home with intermittent assist from daughter and ex-wife. Unsure if can provide 24 hr care at discharge.     Team Discussion:  Goals  supervision level due to WB restrictions. Currently max assist level. MD-adjusting BP and working on pain and anemia issues. New eval today, so one day of therapies. Unsure if will go home from here or to a NH for short time at discharge.  Revisions to Treatment Plan:  New eval  DC 6/19   Continued Need for Acute Rehabilitation Level of Care: The patient requires daily medical management by a physician with specialized training in physical medicine and rehabilitation for the following conditions: Daily direction of a multidisciplinary physical rehabilitation program to ensure safe treatment  while eliciting the highest outcome that is of practical value to the patient.: Yes Daily medical management of patient stability for increased activity during participation in an intensive rehabilitation regime.: Yes Daily analysis of laboratory values and/or radiology reports with any subsequent need for medication adjustment of medical intervention for : Post surgical problems;Blood pressure problems;Other  Alexiah Koroma, [place] 04/02/2017, 3:27 PM

## 2017-04-02 NOTE — Progress Notes (Signed)
Occupational Therapy Session Note  Patient Details  Name: [name] MRN: [id number] Date of Birth: [birth year]  Today's Date: 04/02/2017 OT Individual Time: 1500-1600 OT Individual Time Calculation (min): 60 min    Short Term Goals: Week 1:  OT Short Term Goal 1 (Week 1): Pt will complete LB bathing with min assist sit to stand using AE. OT Short Term Goal 2 (Week 1): Pt will complete LB dressing sit to stand with min assist using AE PRN. OT Short Term Goal 3 (Week 1): Pt will complete shower transfers with min assist during ADL in room. OT Short Term Goal 4 (Week 1): Pt will maintain standing balance with min assist while managing clothing during toileting.    Skilled Therapeutic Interventions/Progress Updates:  Upon entering the room, pt seated in wheelchair with 6/10 pain in L hip. Pt requests to transfer from wheelchair to bed secondary to pain and fatigue. Stand pivot transfer with max A for lifting and lowering and use of RW. Pt having difficulty maintaining PWB precaution during transfer. Pt required mod A for sit >supine. Pt verbalized feeling very anxious and breathing very shallow. OT demonstrated and education pursed lip breathing as well as discussion regarding other calming strategies such as music and visual imagery. Pt returning pursed lip demonstration with min demonstrational cues needed for technique.  Breathing strategies incorporated into B UE strengthening exercises with use of level 2 resistive therabands. Pt performs 2 sets of 10 alternating punches and bicep curls. Pt remained in bed with ice applied to L hip. Call bell and all needed items within reach. Bed alarm activated.   Therapy Documentation Precautions:  Precautions Precautions: Fall Restrictions Weight Bearing Restrictions: Yes LLE Weight Bearing: Partial weight bearing LLE Partial Weight Bearing Percentage or Pounds: 25% General:   Vital Signs: Therapy Vitals Temp: 97.8 F (36.6 C) Temp Source:  Oral Pulse Rate: 88 Resp: 17 BP: 136/60 Patient Position (if appropriate): Lying Oxygen Therapy SpO2: 100 % O2 Device: Not Delivered Pain: Pain Assessment Pain Assessment: Faces Faces Pain Scale: Hurts little more Pain Type: Acute pain Pain Location: Leg Pain Orientation: Left Pain Descriptors / Indicators: Discomfort Pain Onset: With Activity Pain Intervention(s): Repositioned Multiple Pain Sites: No ADL:   Vision Baseline Vision/History: No visual deficits Patient Visual Report: No change from baseline Eye Alignment: Within Functional Limits Perception  Perception: Within Functional Limits Praxis Praxis: Intact Exercises:   Other Treatments:    See Function Navigator for Current Functional Status.   Therapy/Group: Individual Therapy  Gypsy Decant 04/02/2017, 5:29 PM

## 2017-04-02 NOTE — Progress Notes (Signed)
Physical Therapy Assessment and Plan  Patient Details  Name: [name] MRN: [id number] Date of Birth: [birth year]  PT Diagnosis: Abnormality of gait, Difficulty walking, Muscle spasms, Muscle weakness and Pain in Lt LE Rehab Potential: Good ELOS: 14-16 days   Today's Date: 04/02/2017 PT Individual Time: 1101-1212 PT Individual Time Calculation (min): 71 min    Problem List:  Patient Active Problem List   Diagnosis Date Noted  . Post-operative pain   . Hypoalbuminemia due to protein-calorie malnutrition ([place])   . Abnormality of gait   . Closed intertrochanteric fracture of left hip ([place]) 04/01/2017  . Acute blood loss anemia   . Leg edema   . Hip fracture ([place]) 03/30/2017  . Leukocytosis 03/29/2017  . Closed left femoral fracture ([place]) 03/29/2017  . Dehydration 03/29/2017  . Closed left hip fracture ([place]) 03/29/2017  . Mild intermittent asthma with acute exacerbation 01/29/2017  . Chronic renal insufficiency 09/22/2015  . Tachycardia 09/22/2015  . PVC (premature ventricular contraction) 09/22/2015  . Osteoporosis/osteopenia increased risk 06/03/2014  . Basal cell carcinoma of face 06/03/2014  . BPH (benign prostatic hypertrophy) 05/27/2013  . Eczema 04/23/2012  . HTN (hypertension) 04/23/2012  . Dyslipidemia 04/23/2012    Past Medical History:  Past Medical History:  Diagnosis Date  . Allergy    seasonal  fall  . Cancer ([place])    Basal cell carcinoma; multiple  . Eczema   . Hyperlipidemia   . Hypertension    Past Surgical History:  Past Surgical History:  Procedure Laterality Date  . CATARACT EXTRACTION, BILATERAL  06/29/2015  . INTRAMEDULLARY (IM) NAIL INTERTROCHANTERIC Left 03/30/2017   Procedure: INTRAMEDULLARY (IM) NAIL INTERTROCHANTRIC;  Surgeon: [name], MD;  Location: [facility];  Service: Orthopedics;  Laterality: Left;  . MOHS SURGERY    . TONSILLECTOMY  1940 maybe    Assessment & Plan Clinical Impression: Patient is an 83 y.o.right handed  malewith history of hypertension, CKD stage II and chronic leg edema. Per chart review and patient, patient independent living alone prior to admission. He has a supportive daughter lives close by. 1 level home with 2 steps to entry. Presented 03/29/2017 after a fall of which he was down for extended time. No loss of consciousness. X-rays and imaging revealed left intertrochanteric hip fracture. Underwent IM nailing 03/30/2017 per Dr. Marlou Sa. Partial weightbearing left lower extremity of 25 pounds. Acute blood loss anemia 9.1and monitored. Maintained on Xareltofor DVT prophylaxis. Physical and occupationaltherapy evaluationscompleted with recommendations of physical medicine rehabilitation consult.Patient was admitted for a comprehensive rehabilitation program. Patient transferred to CIR on 04/01/2017 .   Patient currently requires max with mobility secondary to muscle weakness and muscle joint tightness and decreased sitting balance, decreased standing balance and difficulty maintaining precautions.  Prior to hospitalization, patient was independent  with mobility and lived with Alone in a House home.  Home access is 5-7 (5-7 front, 2 back)Stairs to enter.  Patient will benefit from skilled PT intervention to maximize safe functional mobility and minimize fall risk for planned discharge home with 24 hour supervision.  Anticipate patient will benefit from follow up [place] at discharge.  PT - End of Session Activity Tolerance: Tolerates 30+ min activity with multiple rests PT Assessment Rehab Potential (ACUTE/IP ONLY): Good Barriers to Discharge: Decreased caregiver support Barriers to Discharge Comments: lives alone, limited available support PT Patient demonstrates impairments in the following area(s): Balance;Endurance;Motor;Pain;Safety PT Transfers Functional Problem(s): Bed Mobility;Bed to Chair;Car;Furniture;Floor PT Locomotion Functional Problem(s): Ambulation;Wheelchair Mobility;Stairs PT  Plan PT Intensity: Minimum  of 1-2 x/day ,45 to 90 minutes PT Frequency: 5 out of 7 days PT Duration Estimated Length of Stay: 14-16 days PT Treatment/Interventions: Ambulation/gait training;Balance/vestibular training;Community reintegration;Discharge planning;DME/adaptive equipment instruction;Functional electrical stimulation;Functional mobility training;Neuromuscular re-education;Pain management;Patient/family education;Psychosocial support;Stair training;Therapeutic Activities;Therapeutic Exercise;UE/LE Strength taining/ROM;Wheelchair propulsion/positioning PT Transfers Anticipated Outcome(s): supervision with assistive device PT Locomotion Anticipated Outcome(s): supervision with assistive device PT Recommendation Recommendations for Other Services: Therapeutic Recreation consult Therapeutic Recreation Interventions: Outing/community reintergration Follow Up Recommendations: Home health PT Patient destination:  (undetermined) Equipment Recommended: Rolling walker with 5" wheels;Wheelchair (measurements);Wheelchair cushion (measurements)  Skilled Therapeutic Intervention   PT Evaluation Precautions/Restrictions Precautions Precautions: Fall Restrictions Weight Bearing Restrictions: Yes LLE Weight Bearing: Partial weight bearing LLE Partial Weight Bearing Percentage or Pounds: 25% Pain  Denies pain at rest, increasing with activity. Monitored during session.  Home Living/Prior Functioning Home Living Living Arrangements: Alone Available Help at Discharge: Friend(s);Available PRN/intermittently Type of Home: House Home Access: Stairs to enter [employer] of Steps: 5-7 (5-7 front, 2 back) Entrance Stairs-Rails:  (both front (wide), none bace) Home Layout: One level Additional Comments: Daughter lives 3 blocks away. Does work from home. Niece lives in town also.   Lives With: Alone Prior Function Level of Independence: Independent with homemaking with ambulation   Able to Take Stairs?: Reciprically Driving:  (never driven) Vocation: Retired Leisure: Hobbies-yes (Comment) (reading, watching the [employer]) Comments: has walker and cane at home.  Vision/Perception  Vision - History Baseline Vision: No visual deficits Visual History:  (had cataracts removed) Vision - Assessment Eye Alignment: Within Functional Limits  Cognition Overall Cognitive Status: Within Functional Limits for tasks assessed Orientation Level: Oriented X4 Attention: Focused Focused Attention: Appears intact Memory: Appears intact Awareness: Appears intact Problem Solving: Appears intact Safety/Judgment: Appears intact Sensation Sensation Light Touch: Appears Intact Proprioception: Appears Intact Coordination Gross Motor Movements are Fluid and Coordinated: Yes Fine Motor Movements are Fluid and Coordinated: Yes Motor  Motor Motor - Skilled Clinical Observations: LLE strength and motion are limited by reports of pain.     Balance Balance Balance Assessed: Yes Static Sitting Balance Static Sitting - Balance Support: Right upper extremity supported Static Sitting - Level of Assistance: 5: Stand by assistance Dynamic Sitting Balance Dynamic Sitting - Balance Support: Right upper extremity supported Dynamic Sitting - Level of Assistance: 5: Stand by assistance Static Standing Balance Static Standing - Balance Support: Bilateral upper extremity supported Static Standing - Level of Assistance: 4: Min assist Dynamic Standing Balance Dynamic Standing - Balance Support: Bilateral upper extremity supported Dynamic Standing - Level of Assistance: 4: Min assist Extremity Assessment  RUE Assessment RUE Assessment: Within Functional Limits LUE Assessment LUE Assessment: Within Functional Limits RLE Assessment RLE Assessment: Within Functional Limits LLE Assessment LLE Assessment: Exceptions to WFL LLE PROM (degrees) Left Hip Flexion: 30 Left Hip ABduction: 10 LLE  Strength LLE Overall Strength Comments: Strength limited by pain, able to demonstrate active dorsiflexion/plantar flexion and trace quad activation.    See Function Navigator for Current Functional Status.   Refer to Care Plan for Long Term Goals  Recommendations for other services: Therapeutic Recreation  Outing/community reintegration  Discharge Criteria: Patient will be discharged from PT if patient refuses treatment 3 consecutive times without medical reason, if treatment goals not met, if there is a change in medical status, if patient makes no progress towards goals or if patient is discharged from hospital.  The above assessment, treatment plan, treatment alternatives and goals were discussed and mutually agreed upon: by patient  Linard Millers, PT  04/02/2017, 4:20 PM

## 2017-04-03 ENCOUNTER — Inpatient Hospital Stay (HOSPITAL_COMMUNITY): Payer: Medicare Other | Admitting: Physical Therapy

## 2017-04-03 ENCOUNTER — Inpatient Hospital Stay (HOSPITAL_COMMUNITY): Payer: Medicare Other | Admitting: Occupational Therapy

## 2017-04-03 ENCOUNTER — Encounter (HOSPITAL_COMMUNITY): Payer: Medicare Other

## 2017-04-03 DIAGNOSIS — I10 Essential (primary) hypertension: Secondary | ICD-10-CM

## 2017-04-03 MED ORDER — BISACODYL 10 MG RE SUPP
10.0000 mg | Freq: Once | RECTAL | Status: AC
  Administered 2017-04-03: 10 mg via RECTAL
  Filled 2017-04-03: qty 1

## 2017-04-03 NOTE — Progress Notes (Signed)
[facility] PHYSICAL MEDICINE & REHABILITATION     PROGRESS NOTE  Subjective/Complaints:  Pt seen laying in bed this AM.  He slept well overnight and had a good first day of therapies yesterday.    ROS: Denies CP, SOB, N/V/D.  Objective: Vital Signs: Blood pressure (!) 115/55, pulse 71, temperature 97.9 F (36.6 C), temperature source Oral, resp. rate 18, height 5\' 8"  (1.727 m), weight 70.7 kg (155 lb 14.4 oz), SpO2 99 %. No results found.  Recent Labs  04/01/17 0558 04/02/17 0621  WBC 9.1 8.8  HGB 9.1* 9.3*  HCT 27.4* 28.6*  PLT 129* 161    Recent Labs  04/01/17 0558 04/02/17 0621  NA 137 135  K 4.0 3.9  CL 100* 100*  GLUCOSE 93 103*  BUN 16 15  CREATININE 1.19 1.03  CALCIUM 8.3* 8.2*   CBG (last 3)  No results for input(s): GLUCAP in the last 72 hours.  Wt Readings from Last 3 Encounters:  04/02/17 70.7 kg (155 lb 14.4 oz)  04/01/17 67.1 kg (148 lb)  01/29/17 67.1 kg (148 lb)    Physical Exam:  BP (!) 115/55 (BP Location: Left Arm)   Pulse 71   Temp 97.9 F (36.6 C) (Oral)   Resp 18   Ht 5\' 8"  (1.727 m)   Wt 70.7 kg (155 lb 14.4 oz)   SpO2 99%   BMI 23.70 kg/m  Constitutional: He appears well-developed. NAD. HENT: Normocephalic. Atraumatic Eyes: EOMare normal. No discharge. Cardiovascular: RRR. No JVD. Respiratory: Effort normal and breath sounds normal.  GI: Soft. Bowel sounds are normal.  Musculoskeletal:  LLE edema.  Neurological: He is alertand oriented 5/5 B/l UE motor.  RLE 4/5 proximally, 5/5 distally.  LLE: HF 2/5,  KE 2+/5, ADF/PF 4+/5 (stable) Psychiatric: He has a normal mood and affect. His behavior is normal.  Skin. Surgical site clean and dry appropriately tender   Assessment/Plan: 1. Functional deficits secondary to Left intertrochanteric hip fracture which require 3+ hours per day of interdisciplinary therapy in a comprehensive inpatient rehab setting. Physiatrist is providing close team supervision and 24 hour management  of active medical problems listed below. Physiatrist and rehab team continue to assess barriers to discharge/monitor patient progress toward functional and medical goals.  Function:  Bathing Bathing position   Position: Wheelchair/chair at sink  Bathing parts Body parts bathed by patient: Right arm, Left arm, Chest, Abdomen, Front perineal area, Right upper leg, Left upper leg, Right lower leg Body parts bathed by helper: Left lower leg, Buttocks, Back  Bathing assist        Upper Body Dressing/Undressing Upper body dressing   What is the patient wearing?: Hospital gown                Upper body assist        Lower Body Dressing/Undressing Lower body dressing   What is the patient wearing?: Non-skid slipper socks         Non-skid slipper socks- Performed by patient: Don/doff right sock Non-skid slipper socks- Performed by helper: Don/doff left sock                  Lower body assist        Toileting Toileting     Toileting steps completed by helper: Adjust clothing prior to toileting, Performs perineal hygiene, Adjust clothing after toileting Toileting Assistive Devices: Grab bar or rail  Toileting assist     Transfers Chair/bed transfer   Chair/bed transfer method: Stand  pivot Chair/bed transfer assist level: Maximal assist (Pt 25 - 49%/lift and lower) Chair/bed transfer assistive device: Armrests, Medical sales representative     Max distance: 2 ft Assist level: Maximal assist (Pt 25 - 49%)   Wheelchair   Type: Manual Max wheelchair distance: 30 ft Assist Level: Supervision or verbal cues  Cognition Comprehension Comprehension assist level: Follows complex conversation/direction with no assist  Expression Expression assist level: Expresses complex ideas: With no assist  Social Interaction Social Interaction assist level: Interacts appropriately with others - No medications needed.  Problem Solving Problem solving assist level: Solves  complex problems: With extra time  Memory Memory assist level: Complete Independence: No helper    Medical Problem List and Plan: 1. Decreased functional mobilitysecondary to left intertrochanteric hip fracture status post IM nailing.   Partial weight bearing 25 pounds  Cont CIR 2. DVT Prophylaxis/Anticoagulation: Xarelto.  3. Pain Management: Hydrocodone and Robaxin as needed 4. Mood: Provide emotional support 5. Neuropsych: This patient iscapable of making decisions on hisown behalf. 6. Skin/Wound Care: Routine skin checks 7. Fluids/Electrolytes/Nutrition: Routine I&Os  BMP within acceptable range on 6/6 8.Acute blood loss anemia.   Hb 9.3 on 6/6 9.Hypertension.   Toprol-XL 50 mg daily.   Patient also on lisinopril 5 mg daily PTA, will resume as needed  Monitor with increased mobility  Controlled 6/7 10.Hyperlipidemia. Pravachol 11. Hypoalbuminemia  Supplement initiated 6/6  LOS (Days) 2 A FACE TO FACE EVALUATION WAS PERFORMED  Laraya Pestka Lorie Phenix 04/03/2017 8:45 AM

## 2017-04-03 NOTE — Progress Notes (Signed)
Physical Therapy Session Note  Patient Details  Name: [name] MRN: [id number] Date of Birth: 1932-02-03  Today's Date: 04/03/2017 PT Individual Time: 0900-1000 PT Individual Time Calculation (min): 60 min   Short Term Goals: Week 1:  PT Short Term Goal 1 (Week 1): Pt to perform supine<>sitting with min assist.  PT Short Term Goal 2 (Week 1): Pt to perfrom sit<>stand with rw and mod assist PT Short Term Goal 3 (Week 1): Pt to ambulate 10 ft with rw while maintaining weight bearing restrictions.  PT Short Term Goal 4 (Week 1): Pt to tolerate Lt hip flexion to 60 degrees for improved ADLs.   Skilled Therapeutic Interventions/Progress Updates:    Session 1: Pt in bed upon arrival, agreeable to PT session. W/C exchanged for improved fit with elevating leg rest. Bed mobility: supine>sitting - mod assist with cues for use of rail and assist with LEs. Sit>supine on mat table, mod assist with LEs. Transfers: sit<>stand max assist from w/c, mod assist from bed with elevated surface. Cues for hand placement and reminder for weightbearing restrictions. Exercises: LLE; assisted extension into neutral hip and knee position, quad sets, glute sets. Pt returned to room after session, with all needs in reach. Quick release belt on.  Session 2: Pt sitting in chair upon arrival, agreeable to PT session. Transfers: sit<>stand performed with cues for hand placement and mod assist. Encouraging forward lean to transition weight to Rt LE. Gait: 7 ft X1, 8 ft X1 - sequential cues provided for pattern, providing verbal and tactile cue for 25% WB. Following session, pt returned to room, up in w/c with all needs in reach and family present.   Therapy Documentation Precautions:  Precautions Precautions: Fall Restrictions Weight Bearing Restrictions: Yes LLE Weight Bearing: Partial weight bearing LLE Partial Weight Bearing Percentage or Pounds: 25  Pain:  6-7/10, pain medication provided during session.   See  Function Navigator for Current Functional Status.   Therapy/Group: Individual Therapy  Linard Millers, PT 04/03/2017, 9:38 AM

## 2017-04-03 NOTE — Progress Notes (Signed)
Social Work Patient ID: [name], male   DOB: December 16, 1931, 83 y.o.   MRN: [id number]  Met with [name]-pt's son to discuss team meeting and recommendation pt will require 24 hr supervision at discharge due to WB issues. He is not sure they can provide 24/7 care. Discussed private duty and short term NHP. Have given son both lists and will touch base after next week's conference. He will discuss with his sister and dad and come up with a plan  For discharge. Encouraged him to visit some of the facilities and provide covering social worker a preference of facilities. Will see next Wed after next team conference to discuss discharge disposition.

## 2017-04-03 NOTE — Progress Notes (Signed)
Occupational Therapy Session Note  Patient Details  Name: [name] MRN: [id number] Date of Birth: 15-Mar-1932  Today's Date: 04/03/2017 OT Individual Time: 1050-1200 OT Individual Time Calculation (min): 70 min    Short Term Goals: Week 1:  OT Short Term Goal 1 (Week 1): Pt will complete LB bathing with min assist sit to stand using AE. OT Short Term Goal 2 (Week 1): Pt will complete LB dressing sit to stand with min assist using AE PRN. OT Short Term Goal 3 (Week 1): Pt will complete shower transfers with min assist during ADL in room. OT Short Term Goal 4 (Week 1): Pt will maintain standing balance with min assist while managing clothing during toileting.    Skilled Therapeutic Interventions/Progress Updates:    Pt completed grooming tasks in sitting at the sink to start session with setup only.  Progressed to bathing and dressing sit to stand with integration of reacher and sock aide for LB dressing.  Pt needs max assist for sit to stand with mod demonstrational cueing for correct technique to maintain TDWBing status in the LLE.  Max assist needed for integration of the reacher to thread underpants and pants over the LLE.  He did not need it to dress the right side as he could bend his RLE up far enough to reach it.  Setup with min instructional cueing to use sockaide for donning left sock.  Finished session with pt in wheelchair and son present in room.  Safety belt in place and call button and phone in reach.    Therapy Documentation Precautions:  Precautions Precautions: Fall Restrictions Weight Bearing Restrictions: Yes LLE Weight Bearing: Partial weight bearing LLE Partial Weight Bearing Percentage or Pounds: 25  Pain:  No report of pain at rest.  Increases to 7/10 with transition sit to stand  See Function Navigator for Current Functional Status.   Therapy/Group: Individual Therapy  Skylyn Slezak OTR/L 04/03/2017, 5:08 PM

## 2017-04-04 ENCOUNTER — Inpatient Hospital Stay (HOSPITAL_COMMUNITY): Payer: Medicare Other

## 2017-04-04 ENCOUNTER — Inpatient Hospital Stay (HOSPITAL_COMMUNITY): Payer: Medicare Other | Admitting: Physical Therapy

## 2017-04-04 DIAGNOSIS — M7989 Other specified soft tissue disorders: Secondary | ICD-10-CM

## 2017-04-04 DIAGNOSIS — R159 Full incontinence of feces: Secondary | ICD-10-CM

## 2017-04-04 MED ORDER — SENNOSIDES-DOCUSATE SODIUM 8.6-50 MG PO TABS
1.0000 | ORAL_TABLET | Freq: Every day | ORAL | Status: DC
  Administered 2017-04-05 – 2017-04-15 (×11): 1 via ORAL
  Filled 2017-04-04 (×10): qty 1

## 2017-04-04 NOTE — Progress Notes (Signed)
Physical Therapy Session Note  Patient Details  Name: [name] MRN: [id number] Date of Birth: September 08, 1932  Today's Date: 04/03/2017 PT Individual Time: 2637  -  1449   34 min   Short Term Goals: Week 1:  PT Short Term Goal 1 (Week 1): Pt to perform supine<>sitting with min assist.  PT Short Term Goal 2 (Week 1): Pt to perfrom sit<>stand with rw and mod assist PT Short Term Goal 3 (Week 1): Pt to ambulate 10 ft with rw while maintaining weight bearing restrictions.  PT Short Term Goal 4 (Week 1): Pt to tolerate Lt hip flexion to 60 degrees for improved ADLs.   Skilled Therapeutic Interventions/Progress Updates:    Pt received sitting in WC and agreeable to PT Pt noted to have incontinent bladded episode. Pt declined changing pants even with several attempts from PT. Pt initiated WC mobility but PT noted cut on L index finger.  RN made aware and provided bandage for open wound. WC mobility in WC x 127ft with min assist for improved turning technique. PT fit pt for 18x18 WC to allow deceased energy expenditure with WC mobility and and improved control of WC. Squat pivot transfer for WC<>WC with min- mod assist from PT and moderate cues for decreased WC through the LLE and proper set up for transfer. Additional gait training x 172ft in 18x18 WC with increased control in turns and improved maintenance of straight path.      Therapy Documentation Precautions:  Precautions Precautions: Fall Restrictions Weight Bearing Restrictions: Yes LLE Weight Bearing: Partial weight bearing LLE Partial Weight Bearing Percentage or Pounds: 25 Vital Signs: Therapy Vitals Temp: 98.8 F (37.1 C) Temp Source: Oral Pulse Rate: 100 Resp: 18 BP: 117/66 Patient Position (if appropriate): Lying Oxygen Therapy SpO2: 96 % O2 Device: Not Delivered Pain: Pain Assessment Pain Assessment: (P) 0-10 Pain Score: (P) 9  Faces Pain Scale: (P) Hurts whole lot Pain Type: (P) Acute pain;Surgical pain Pain  Location: (P) Hip Pain Orientation: (P) Left Pain Radiating Towards: (P) leg, rectal discomfort Pain Descriptors / Indicators: (P) Aching;Pressure;Sore;Shooting;Throbbing Pain Frequency: (P) Intermittent Pain Onset: (P) Unable to tell Patients Stated Pain Goal: (P) Other (Comment) Pain Intervention(s): (P) Medication (See eMAR);Cold applied Multiple Pain Sites: (P) No   See Function Navigator for Current Functional Status.   Therapy/Group: Individual Therapy  Lorie Phenix 04/04/2017, 5:19 AM

## 2017-04-04 NOTE — Progress Notes (Signed)
Physical Therapy Session Note  Patient Details  Name: [name] MRN: [id number] Date of Birth: [birth year]  Today's Date: 04/04/2017 PT Individual Time: 1000-1100 PT Individual Time Calculation (min): 60 min   Short Term Goals: Week 1:  PT Short Term Goal 1 (Week 1): Pt to perform supine<>sitting with min assist.  PT Short Term Goal 2 (Week 1): Pt to perfrom sit<>stand with rw and mod assist PT Short Term Goal 3 (Week 1): Pt to ambulate 10 ft with rw while maintaining weight bearing restrictions.  PT Short Term Goal 4 (Week 1): Pt to tolerate Lt hip flexion to 60 degrees for improved ADLs.   Skilled Therapeutic Interventions/Progress Updates:    Pt in bed upon arrival, agreeable to PT session. Bed mobility mod assist supine>sitting with verbal and manual cues for sequence. Transfers: sit<>stand using rw and multimodal cues for sequence. Initial stand from elevated bed performed with mod assist. Following transfers from w/c performed with max assist. Stand-pivot to w/c performed when pt noted to bed incontinent of bowels. Total assist for doffing pants and for hygiene. When starting to don new pants, the pt was incontinent of bowels again and transferred to [facility]. +2 assist needed for hygiene and donning brief/pants in standing. Pt transported to gym following and ambulation was attempted. Pt able to ambulate 5 ft with max assist -support provided at trunk and to advance LLE. Cue for sequence and reminder for weightbearing restrictions. Following session, pt returned to room via w/c. Pt able to propel w/c 50 ft with supervision. Pt left in w/c with son present and all needs in reach.   Therapy Documentation Precautions:  Precautions Precautions: Fall Restrictions Weight Bearing Restrictions: Yes LLE Weight Bearing: Partial weight bearing LLE Partial Weight Bearing Percentage or Pounds: 25 Pain: Pain Assessment Pain Score: 6/10, LLE - nurse providing pain medication.  See Function Navigator  for Current Functional Status.   Therapy/Group: Individual Therapy  Linard Millers, PT 04/04/2017, 3:02 PM

## 2017-04-04 NOTE — Progress Notes (Signed)
[facility] PHYSICAL MEDICINE & REHABILITATION     PROGRESS NOTE  Subjective/Complaints:  Pt seen laying in bed this AM.  He did not sleep well overnight and states he was just uncomfortable.  He is tired this AM as a result.  He feels as if he needs to use the restroom this AM.    ROS: Denies CP, SOB, N/V/D.  Objective: Vital Signs: Blood pressure 117/66, pulse 100, temperature 98.8 F (37.1 C), temperature source Oral, resp. rate 18, height 5\' 8"  (1.727 m), weight 70.7 kg (155 lb 14.4 oz), SpO2 96 %. No results found.  Recent Labs  04/02/17 0621  WBC 8.8  HGB 9.3*  HCT 28.6*  PLT 161    Recent Labs  04/02/17 0621  NA 135  K 3.9  CL 100*  GLUCOSE 103*  BUN 15  CREATININE 1.03  CALCIUM 8.2*   CBG (last 3)  No results for input(s): GLUCAP in the last 72 hours.  Wt Readings from Last 3 Encounters:  04/02/17 70.7 kg (155 lb 14.4 oz)  04/01/17 67.1 kg (148 lb)  01/29/17 67.1 kg (148 lb)    Physical Exam:  BP 117/66 (BP Location: Left Arm)   Pulse 100   Temp 98.8 F (37.1 C) (Oral)   Resp 18   Ht 5\' 8"  (1.727 m)   Wt 70.7 kg (155 lb 14.4 oz)   SpO2 96%   BMI 23.70 kg/m  Constitutional: He appears well-developed. NAD. HENT: Normocephalic. Atraumatic Eyes: EOMare normal. No discharge. Cardiovascular: RRR. No JVD. Respiratory: Effort normal and breath sounds normal.  GI: Soft. Bowel sounds are normal.  Musculoskeletal:  LLE edema.  Neurological: He is alertand oriented 5/5 B/l UE motor.  RLE 4/5 proximally, 5/5 distally.  LLE: HF 2/5,  KE 2+/5, ADF/PF 4+/5 (unchanged) Psychiatric: He has a normal mood and affect. His behavior is normal.  Skin. Surgical site clean and dry appropriately tender   Assessment/Plan: 1. Functional deficits secondary to Left intertrochanteric hip fracture which require 3+ hours per day of interdisciplinary therapy in a comprehensive inpatient rehab setting. Physiatrist is providing close team supervision and 24 hour  management of active medical problems listed below. Physiatrist and rehab team continue to assess barriers to discharge/monitor patient progress toward functional and medical goals.  Function:  Bathing Bathing position   Position: Wheelchair/chair at sink  Bathing parts Body parts bathed by patient: Right arm, Left arm, Chest, Abdomen, Front perineal area, Right upper leg, Left upper leg, Right lower leg Body parts bathed by helper: Buttocks, Left lower leg, Back  Bathing assist        Upper Body Dressing/Undressing Upper body dressing   What is the patient wearing?: Pull over shirt/dress     Pull over shirt/dress - Perfomed by patient: Put head through opening, Thread/unthread right sleeve, Thread/unthread left sleeve, Pull shirt over trunk          Upper body assist        Lower Body Dressing/Undressing Lower body dressing   What is the patient wearing?: Underwear, Pants, Non-skid slipper socks Underwear - Performed by patient: Thread/unthread right underwear leg Underwear - Performed by helper: Thread/unthread left underwear leg, Pull underwear up/down Pants- Performed by patient: Thread/unthread right pants leg Pants- Performed by helper: Thread/unthread left pants leg, Pull pants up/down Non-skid slipper socks- Performed by patient: Don/doff right sock, Don/doff left sock Non-skid slipper socks- Performed by helper: Don/doff left sock  Lower body assist        Toileting Toileting     Toileting steps completed by helper: Adjust clothing prior to toileting, Performs perineal hygiene, Adjust clothing after toileting Toileting Assistive Devices: Grab bar or rail  Toileting assist Assist level: Touching or steadying assistance (Pt.75%)   Transfers Chair/bed transfer   Chair/bed transfer method: Stand pivot Chair/bed transfer assist level: Maximal assist (Pt 25 - 49%/lift and lower) Chair/bed transfer assistive device: Armrests, Walker      Locomotion Ambulation     Max distance: 8 ft Assist level: Moderate assist (Pt 50 - 74%)   Wheelchair   Type: Manual Max wheelchair distance: 30 ft Assist Level: Supervision or verbal cues  Cognition Comprehension Comprehension assist level: Follows complex conversation/direction with no assist  Expression Expression assist level: Expresses complex ideas: With no assist  Social Interaction Social Interaction assist level: Interacts appropriately with others - No medications needed.  Problem Solving Problem solving assist level: Solves complex problems: With extra time  Memory Memory assist level: Recognizes or recalls 90% of the time/requires cueing < 10% of the time    Medical Problem List and Plan: 1. Decreased functional mobilitysecondary to left intertrochanteric hip fracture status post IM nailing on 03/30/17.   Partial weight bearing 25 pounds  Cont CIR 2. DVT Prophylaxis/Anticoagulation: Xarelto.  3. Pain Management: Hydrocodone and Robaxin as needed 4. Mood: Provide emotional support 5. Neuropsych: This patient iscapable of making decisions on hisown behalf. 6. Skin/Wound Care: Routine skin checks 7. Fluids/Electrolytes/Nutrition: Routine I&Os  BMP within acceptable range on 6/6 8.Acute blood loss anemia.   Hb 9.3 on 6/6 9.Hypertension.   Toprol-XL 50 mg daily.   Patient also on lisinopril 5 mg daily PTA, will resume as needed  Monitor with increased mobility  Controlled 6/8 10.Hyperlipidemia. Pravachol 11. Hypoalbuminemia  Supplement initiated 6/6 12. Bowel incontinence  After several days of constipation  Cont to monitor, bowel meds adjusted  LOS (Days) 3 A FACE TO FACE EVALUATION WAS PERFORMED  Liliann File Lorie Phenix 04/04/2017 8:31 AM

## 2017-04-04 NOTE — Progress Notes (Signed)
Occupational Therapy Session Note  Patient Details  Name: [name] MRN: [id number] Date of Birth: 08-18-1932  Today's Date: 04/04/2017 OT Individual Time: 8616-8372 OT Individual Time Calculation (min): 57 min    Short Term Goals: Week 1:  OT Short Term Goal 1 (Week 1): Pt will complete LB bathing with min assist sit to stand using AE. OT Short Term Goal 2 (Week 1): Pt will complete LB dressing sit to stand with min assist using AE PRN. OT Short Term Goal 3 (Week 1): Pt will complete shower transfers with min assist during ADL in room. OT Short Term Goal 4 (Week 1): Pt will maintain standing balance with min assist while managing clothing during toileting.    Skilled Therapeutic Interventions/Progress Updates:    1:1. Pt reporting [pain in hip but does not rate and reports extreme fatigue from lack of sleep. Focus of sessionon sit to stand, functional transfers, AE for LB dressing and endurance in context of ADL. Pt had incontinent bowel episode on chuck pad, and reports needing to void bowel. Pt stand pivot transfer with RW with MAX A and Vc for WB precautions and safety awareness EOB>BSC>w/c. Pt dresses with significantly increased time while seated on commode with VC for technique for AE use and A to don L sock 2/2 to decreased strength from fatigue. Pt sit to stand with MAX A for OT to complete hygiene and clothing management. Exited session with pt seated in w/c with call light in reach and safety belt in place.   Therapy Documentation Precautions:  Precautions Precautions: Fall Restrictions Weight Bearing Restrictions: Yes LLE Weight Bearing: Partial weight bearing LLE Partial Weight Bearing Percentage or Pounds: 25  See Function Navigator for Current Functional Status.   Therapy/Group: Individual Therapy  Tonny Branch 04/04/2017, 8:58 AM

## 2017-04-04 NOTE — Progress Notes (Signed)
Occupational Therapy Session Note  Patient Details  Name: Cordie Buening MRN: [id number] Date of Birth: [birth year]  Today's Date: 04/04/2017 OT Individual Time: 1430-1540 OT Individual Time Calculation (min): 70 min    Short Term Goals: Week 1:  OT Short Term Goal 1 (Week 1): Pt will complete LB bathing with min assist sit to stand using AE. OT Short Term Goal 2 (Week 1): Pt will complete LB dressing sit to stand with min assist using AE PRN. OT Short Term Goal 3 (Week 1): Pt will complete shower transfers with min assist during ADL in room. OT Short Term Goal 4 (Week 1): Pt will maintain standing balance with min assist while managing clothing during toileting.    Skilled Therapeutic Interventions/Progress Updates:    1:1. No pain reported. Pt requiring increased time for rest breaks 2/2 fatigue. Pt supine>EOB with MOD A for LLE management and trunk facilitation. Pt dons pull over shorts and requires question cues to recall dressing techniques using reacher/dressing LLE first. OT dons TED hose and pt uses sock aide to don B socks with VC for technique. Pt completes sit to stand with MAX A for OT to advance pants over hips. Pt brushes teeth and shaves at sink with increased time. Pt completes 4 sit to stands throughout session with MAX A fading to mod A with VC for safety awareness and WB precautions. Exited session with pt seated in w/c, call light in reach and safety belt donned.   Therapy Documentation Precautions:  Precautions Precautions: Fall Restrictions Weight Bearing Restrictions: Yes LLE Weight Bearing: Partial weight bearing LLE Partial Weight Bearing Percentage or Pounds: 25  See Function Navigator for Current Functional Status.   Therapy/Group: Individual Therapy  Tonny Branch 04/04/2017, 3:46 PM

## 2017-04-04 NOTE — Progress Notes (Signed)
Received order from on call doe dulcolax suppository with excellent results several large BM stools, incontinent care provided,

## 2017-04-04 NOTE — Progress Notes (Signed)
*  PRELIMINARY RESULTS* Vascular Ultrasound Bilateral lower extremity venous duplex has been completed.  Preliminary findings: No evidence of deep vein thrombosis in the visualized veins of the lower extremities.  Negative for baker's cysts bilaterally.   [name] 04/04/2017, 12:03 PM

## 2017-04-05 ENCOUNTER — Inpatient Hospital Stay (HOSPITAL_COMMUNITY): Payer: Medicare Other

## 2017-04-05 ENCOUNTER — Inpatient Hospital Stay (HOSPITAL_COMMUNITY): Payer: Medicare Other | Admitting: Physical Therapy

## 2017-04-05 DIAGNOSIS — N3944 Nocturnal enuresis: Secondary | ICD-10-CM

## 2017-04-05 DIAGNOSIS — W19XXXA Unspecified fall, initial encounter: Secondary | ICD-10-CM

## 2017-04-05 NOTE — Progress Notes (Signed)
Physical Therapy Session Note  Patient Details  Name: [name] MRN: [id number] Date of Birth: 1932/07/14  Today's Date: 04/05/2017 PT Individual Time: 1420-1520 PT Individual Time Calculation (min): 60 min   Short Term Goals: Week 1:  PT Short Term Goal 1 (Week 1): Pt to perform supine<>sitting with min assist.  PT Short Term Goal 2 (Week 1): Pt to perfrom sit<>stand with rw and mod assist PT Short Term Goal 3 (Week 1): Pt to ambulate 10 ft with rw while maintaining weight bearing restrictions.  PT Short Term Goal 4 (Week 1): Pt to tolerate Lt hip flexion to 60 degrees for improved ADLs.   Skilled Therapeutic Interventions/Progress Updates:   Pt received sitting in WC and agreeable to PT  Pt reports fall earlier in day while trying to urinate with RN, and states mild pain in entire LLE. PT attempted to stand pt to pull pants to waist with max assist. Pt unable to rise to standing position due to pain in L hip and knee as well as L flank. PT instructed pt in Max assist squat pivot to bed. Sit>supine with mod-max assist from PT to manage the LLE. Significant pain in L knee with transitional movement.  Pt instructed pt in Supine UE and LE therex.  UE: weight ball chest press. Lat pull down level 2 tband , tricep extension level 2 tband. , chest press with 1kg ball. Trunk rotation with 1kg ball. All UE therex completed x 12 BLE.  LE: hip abduction (AROM RLE and AAROM on the L) RLE heel slides. RLE SLR. RLE bridge, RLEankle PF/DF with level 2 tband. LLE SAQ AAROM.  All RLE therex complete x 12 all LLE completed x 8.   Pt reports steady increase in pain throughout treatment. Pt left supine in bed with call bell in reach at end of treatment.      Therapy Documentation Precautions:  Precautions Precautions: Fall Restrictions Weight Bearing Restrictions: Yes LLE Weight Bearing: Partial weight bearing LLE Partial Weight Bearing Percentage or Pounds: 25 General: PT Amount of Missed Time  (min): 15 Minutes PT Missed Treatment Reason: Pain;Patient fatigue Vital Signs: Therapy Vitals Temp: 98.1 F (36.7 C) Temp Source: Oral Pulse Rate: 91 Resp: 18 BP: (!) 117/57 Patient Position (if appropriate): Sitting Oxygen Therapy SpO2: 100 % O2 Device: Not Delivered Pain: Pain Assessment Pain Assessment: 0-10 Pain Score: 2  Pain Type: Surgical pain Pain Location: Hip Pain Orientation: Left Pain Descriptors / Indicators: Aching Pain Frequency: Intermittent Pain Onset: With Activity Patients Stated Pain Goal: 4 Pain Intervention(s): Medication (See eMAR)  See Function Navigator for Current Functional Status.   Therapy/Group: Individual Therapy  Lorie Phenix 04/05/2017, 4:16 PM

## 2017-04-05 NOTE — Progress Notes (Signed)
At 1220, writer was assisting patient with the urinal, patient stood with his walker so his pants and brief could be lowered. When he started to sit, he pushed the walker away, writer had a hand on the patient and assisted the fall to assure patient's left hip and head did not strike anything. Patient denies pain in the left hip area. Dr. Posey Pronto notified. Post fall procedures initiated.

## 2017-04-05 NOTE — Progress Notes (Signed)
Brief note:   Pt with assisted fall.  Will monitor for now and consider further workup if necessary.

## 2017-04-05 NOTE — Progress Notes (Signed)
Occupational Therapy Session Note  Patient Details  Name: [name] MRN: [id number] Date of Birth: 1932/10/04  Today's Date: 04/05/2017 OT Individual Time: 1583-0940 OT Individual Time Calculation (min): 60 min    Short Term Goals: Week 1:  OT Short Term Goal 1 (Week 1): Pt will complete LB bathing with min assist sit to stand using AE. OT Short Term Goal 2 (Week 1): Pt will complete LB dressing sit to stand with min assist using AE PRN. OT Short Term Goal 3 (Week 1): Pt will complete shower transfers with min assist during ADL in room. OT Short Term Goal 4 (Week 1): Pt will maintain standing balance with min assist while managing clothing during toileting.    Skilled Therapeutic Interventions/Progress Updates:    1:1. Pt reporting pain in L hip when mobile. RN aware. Focus of session on LB dressing AE, sit to stand, standing balance and functional transfers within ADL. Pt supine>EOB>w/c stand pivot with RW with MAX A for lifting and lowering assistance with VC for WB precautions, sequencing and safety. Pt dresses LB with reacher and sock aide with subtle cue to dress LLE first requiring significantly less time to thread legs as compared to yesterday. Pt completes sit to stand with MOD A for lifting to maintain static stand as OT advances pants past hips. Pt requests to void bowel. Pt trasnsfers to [facility] in same manner as stated above to void bowel with increased time. Pt requires total A for toileting, however pt able to maintain static standing balance with touching A. Exited session with pt seated in w/c with safety belt donned and call light in reach.   Therapy Documentation Precautions:  Precautions Precautions: Fall Restrictions Weight Bearing Restrictions: Yes LLE Weight Bearing: Partial weight bearing LLE Partial Weight Bearing Percentage or Pounds: 25  See Function Navigator for Current Functional Status.   Therapy/Group: Individual Therapy  Tonny Branch 04/05/2017,  10:19 AM

## 2017-04-05 NOTE — Progress Notes (Signed)
Occupational Therapy Session Note  Patient Details  Name: [name] MRN: [id number] Date of Birth: [birth year]  Today's Date: 04/05/2017 OT Individual Time: 4235-3614 OT Individual Time Calculation (min): 60 min    Short Term Goals: Week 1:  OT Short Term Goal 1 (Week 1): Pt will complete LB bathing with min assist sit to stand using AE. OT Short Term Goal 2 (Week 1): Pt will complete LB dressing sit to stand with min assist using AE PRN. OT Short Term Goal 3 (Week 1): Pt will complete shower transfers with min assist during ADL in room. OT Short Term Goal 4 (Week 1): Pt will maintain standing balance with min assist while managing clothing during toileting.    Skilled Therapeutic Interventions/Progress Updates:    1;1. Pt reportng fall earlier with RN, however pain medication has helped and pt agreeable to complete OOB activity. Focus of session on transfer training to [facility], grooming and therex. Pt completes stand pivot transfers EOB<>w/c<> BSC x2 with overall MAX A for lifting and lowering, Vc for sequencing, safety and WB precautions. Pt benefits from cue nose over toes for sit to stand & stand to sit transition. Pt grooms seated at sink with increased time. Pt completes UB strengthening 1x10 set of exercises in all planes of motion with 4 # dowel rod or  2# wrist weights on BUE with instructional cues for technique to improve BUE strength required for BADLS/transfers. Exited session with pt supine in bed with call light in reach, bed exit alarm on and all needs met.   Therapy Documentation Precautions:  Precautions Precautions: Fall Restrictions Weight Bearing Restrictions: Yes LLE Weight Bearing: Partial weight bearing LLE Partial Weight Bearing Percentage or Pounds: 25  See Function Navigator for Current Functional Status.   Therapy/Group: Individual Therapy  Tonny Branch 04/05/2017, 6:45 PM

## 2017-04-05 NOTE — Progress Notes (Signed)
[place] PHYSICAL MEDICINE & REHABILITATION     PROGRESS NOTE  Subjective/Complaints:  Pt seen laying in bed this AM.  He states he slept well, except for 2 incontinent urine episodes overnight.   ROS: Denies CP, SOB, N/V/D.  Objective: Vital Signs: Blood pressure 119/60, pulse 77, temperature 98.2 F (36.8 C), temperature source Oral, resp. rate 18, height 5\' 8"  (1.727 m), weight 70.7 kg (155 lb 14.4 oz), SpO2 98 %. No results found. No results for input(s): WBC, HGB, HCT, PLT in the last 72 hours. No results for input(s): NA, K, CL, GLUCOSE, BUN, CREATININE, CALCIUM in the last 72 hours.  Invalid input(s): CO CBG (last 3)  No results for input(s): GLUCAP in the last 72 hours.  Wt Readings from Last 3 Encounters:  04/02/17 70.7 kg (155 lb 14.4 oz)  04/01/17 67.1 kg (148 lb)  01/29/17 67.1 kg (148 lb)    Physical Exam:  BP 119/60   Pulse 77   Temp 98.2 F (36.8 C) (Oral)   Resp 18   Ht 5\' 8"  (1.727 m)   Wt 70.7 kg (155 lb 14.4 oz)   SpO2 98%   BMI 23.70 kg/m  Constitutional: He appears well-developed. NAD. HENT: Normocephalic. Atraumatic Eyes: EOMare normal. No discharge. Cardiovascular: RRR. No JVD. Respiratory: Effort normal and breath sounds normal.  GI: Soft. Bowel sounds are normal.  Musculoskeletal:  LLE edema.  Neurological: He is alertand oriented 5/5 B/l UE motor.  RLE 4/5 proximally, 5/5 distally.  LLE: HF 2/5,  KE 2+/5, ADF/PF 4+/5 (stable) Psychiatric: He has a normal mood and affect. His behavior is normal.  Skin. Surgical site clean and dry appropriately tender   Assessment/Plan: 1. Functional deficits secondary to Left intertrochanteric hip fracture which require 3+ hours per day of interdisciplinary therapy in a comprehensive inpatient rehab setting. Physiatrist is providing close team supervision and 24 hour management of active medical problems listed below. Physiatrist and rehab team continue to assess barriers to discharge/monitor  patient progress toward functional and medical goals.  Function:  Bathing Bathing position   Position: Wheelchair/chair at sink  Bathing parts Body parts bathed by patient: Right arm, Left arm, Chest, Abdomen, Front perineal area, Right upper leg, Left upper leg, Right lower leg Body parts bathed by helper: Buttocks, Left lower leg, Back  Bathing assist        Upper Body Dressing/Undressing Upper body dressing   What is the patient wearing?: Pull over shirt/dress     Pull over shirt/dress - Perfomed by patient: Put head through opening, Thread/unthread right sleeve, Thread/unthread left sleeve, Pull shirt over trunk          Upper body assist        Lower Body Dressing/Undressing Lower body dressing   What is the patient wearing?: Ted Hose, Pants, Non-skid slipper socks Underwear - Performed by patient: Thread/unthread right underwear leg, Thread/unthread left underwear leg Underwear - Performed by helper: Pull underwear up/down Pants- Performed by patient: Thread/unthread right pants leg, Thread/unthread left pants leg Pants- Performed by helper: Pull pants up/down Non-skid slipper socks- Performed by patient: Don/doff right sock, Don/doff left sock Non-skid slipper socks- Performed by helper: Don/doff right sock               TED Hose - Performed by helper: Don/doff right TED hose, Don/doff left TED hose  Lower body assist Assist for lower body dressing:  (MOD A)      Toileting Toileting Toileting activity did not occur: No continent  bowel/bladder event   Toileting steps completed by helper: Adjust clothing prior to toileting, Performs perineal hygiene, Adjust clothing after toileting Toileting Assistive Devices: Grab bar or rail  Toileting assist Assist level:  (TOTAL A)   Transfers Chair/bed transfer   Chair/bed transfer method: Stand pivot Chair/bed transfer assist level: Maximal assist (Pt 25 - 49%/lift and lower) Chair/bed transfer assistive device:  Armrests, Medical sales representative     Max distance: 5 Assist level: Maximal assist (Pt 25 - 49%)   Wheelchair   Type: Manual Max wheelchair distance: 50 ft Assist Level: Supervision or verbal cues  Cognition Comprehension Comprehension assist level: Understands complex 90% of the time/cues 10% of the time  Expression Expression assist level: Expresses complex ideas: With no assist  Social Interaction Social Interaction assist level: Interacts appropriately with others - No medications needed.  Problem Solving Problem solving assist level: Solves complex problems: With extra time  Memory Memory assist level: Recognizes or recalls 90% of the time/requires cueing < 10% of the time    Medical Problem List and Plan: 1. Decreased functional mobilitysecondary to left intertrochanteric hip fracture status post IM nailing on 03/30/17.   Partial weight bearing 25 pounds  Cont CIR 2. DVT Prophylaxis/Anticoagulation: Xarelto.  Vascular study neg for DVT  3. Pain Management: Hydrocodone and Robaxin as needed 4. Mood: Provide emotional support 5. Neuropsych: This patient iscapable of making decisions on hisown behalf. 6. Skin/Wound Care: Routine skin checks 7. Fluids/Electrolytes/Nutrition: Routine I&Os  BMP within acceptable range on 6/6 8.Acute blood loss anemia.   Hb 9.3 on 6/6 9.Hypertension.   Toprol-XL 50 mg daily.   Patient also on lisinopril 5 mg daily PTA, will resume as needed  Monitor with increased mobility  Controlled 6/8 10.Hyperlipidemia. Pravachol 11. Hypoalbuminemia  Supplement initiated 6/6 12. Bowel incontinence  After several days of constipation  Cont to monitor, bowel meds adjusted, continues to empty 13. Nocturnal enuresis  At bedtime, will perform timed voiding prior to sleep  LOS (Days) 4 A FACE TO FACE EVALUATION WAS PERFORMED  Sajan Cheatwood Lorie Phenix 04/05/2017 8:36 AM

## 2017-04-06 DIAGNOSIS — W19XXXD Unspecified fall, subsequent encounter: Secondary | ICD-10-CM

## 2017-04-06 NOTE — Progress Notes (Signed)
At approximately 1220, writer was helping patient use a urinal, when patient attempted to sit, he lost his balance and began to fall.  Writer had a hand on the patient and was able to assist the patient to the floor preventing a hard impact. Patient did not strike his left hip or head.  Patient stated that he did not feel any pain or discomfort from this incident.  Dr Posey Pronto and the patient's family were notified, post fall huddle was accomplished, the safety zone was entered and patient was placed on the 24 hour post fall vital sign schedule.

## 2017-04-06 NOTE — Progress Notes (Addendum)
Received report from previous nurse that patient fell during day shift, but mentioned that he did not hit his head and prevented him from landing on his left hip. Upon assessment, pt denied his pain. He was alert and oriented x 4 , no signs of distress. Will continue to monitor.

## 2017-04-06 NOTE — Progress Notes (Signed)
[place] PHYSICAL MEDICINE & REHABILITATION     PROGRESS NOTE  Subjective/Complaints:  Pt seen laying in bed this AM.  He slept well overnight.  He denies pain after assisted fall yesterday.  He also reports he did not have incontinence overnight.   ROS: Denies CP, SOB, N/V/D.  Objective: Vital Signs: Blood pressure (!) 116/58, pulse 82, temperature 98.1 F (36.7 C), temperature source Oral, resp. rate 18, height 5\' 8"  (1.727 m), weight 68 kg (150 lb), SpO2 96 %. No results found. No results for input(s): WBC, HGB, HCT, PLT in the last 72 hours. No results for input(s): NA, K, CL, GLUCOSE, BUN, CREATININE, CALCIUM in the last 72 hours.  Invalid input(s): CO CBG (last 3)  No results for input(s): GLUCAP in the last 72 hours.  Wt Readings from Last 3 Encounters:  04/06/17 68 kg (150 lb)  04/01/17 67.1 kg (148 lb)  01/29/17 67.1 kg (148 lb)    Physical Exam:  BP (!) 116/58   Pulse 82   Temp 98.1 F (36.7 C) (Oral)   Resp 18   Ht 5\' 8"  (1.727 m)   Wt 68 kg (150 lb)   SpO2 96%   BMI 22.81 kg/m  Constitutional: He appears well-developed. NAD. HENT: Normocephalic. Atraumatic Eyes: EOMare normal. No discharge. Cardiovascular: RRR. No JVD. Respiratory: Effort normal and breath sounds normal.  GI: Soft. Bowel sounds are normal.  Musculoskeletal:  LLE edema.  Neurological: He is alertand oriented 5/5 B/l UE motor.  RLE 4/5 proximally, 5/5 distally.  LLE: HF 2/5,  KE 2+/5, ADF/PF 4+/5 (unchanged) Psychiatric: He has a normal mood and affect. His behavior is normal.  Skin. Surgical site clean and dry appropriately tender   Assessment/Plan: 1. Functional deficits secondary to Left intertrochanteric hip fracture which require 3+ hours per day of interdisciplinary therapy in a comprehensive inpatient rehab setting. Physiatrist is providing close team supervision and 24 hour management of active medical problems listed below. Physiatrist and rehab team continue to assess  barriers to discharge/monitor patient progress toward functional and medical goals.  Function:  Bathing Bathing position   Position: Wheelchair/chair at sink  Bathing parts Body parts bathed by patient: Right arm, Left arm, Chest, Abdomen, Front perineal area, Right upper leg, Left upper leg, Right lower leg Body parts bathed by helper: Buttocks, Left lower leg, Back  Bathing assist        Upper Body Dressing/Undressing Upper body dressing   What is the patient wearing?: Pull over shirt/dress     Pull over shirt/dress - Perfomed by patient: Put head through opening, Thread/unthread right sleeve, Thread/unthread left sleeve, Pull shirt over trunk          Upper body assist        Lower Body Dressing/Undressing Lower body dressing   What is the patient wearing?: Ted Hose, Pants, Non-skid slipper socks Underwear - Performed by patient: Thread/unthread right underwear leg, Thread/unthread left underwear leg Underwear - Performed by helper: Pull underwear up/down Pants- Performed by patient: Thread/unthread right pants leg, Thread/unthread left pants leg Pants- Performed by helper: Pull pants up/down Non-skid slipper socks- Performed by patient: Don/doff right sock, Don/doff left sock Non-skid slipper socks- Performed by helper: Don/doff right sock               TED Hose - Performed by helper: Don/doff right TED hose, Don/doff left TED hose  Lower body assist Assist for lower body dressing: Touching or steadying assistance (Pt > 75%)  Toileting Toileting Toileting activity did not occur: No continent bowel/bladder event Toileting steps completed by patient: Performs perineal hygiene Toileting steps completed by helper: Adjust clothing prior to toileting, Adjust clothing after toileting Toileting Assistive Devices: Grab bar or rail, Toilet aid  Toileting assist Assist level: More than reasonable time   Transfers Chair/bed transfer   Chair/bed transfer method: Squat  pivot Chair/bed transfer assist level: Maximal assist (Pt 25 - 49%/lift and lower) Chair/bed transfer assistive device: Armrests, Medical sales representative     Max distance: 5 Assist level: Maximal assist (Pt 25 - 49%)   Wheelchair   Type: Manual Max wheelchair distance: 50 ft Assist Level: Supervision or verbal cues  Cognition Comprehension Comprehension assist level: Follows basic conversation/direction with no assist  Expression Expression assist level: Expresses complex ideas: With no assist  Social Interaction Social Interaction assist level: Interacts appropriately with others - No medications needed.  Problem Solving Problem solving assist level: Solves basic 90% of the time/requires cueing < 10% of the time  Memory Memory assist level: Recognizes or recalls 75 - 89% of the time/requires cueing 10 - 24% of the time    Medical Problem List and Plan: 1. Decreased functional mobilitysecondary to left intertrochanteric hip fracture status post IM nailing on 03/30/17.   Partial weight bearing 25 pounds  Cont CIR 2. DVT Prophylaxis/Anticoagulation: Xarelto.  Vascular study neg for DVT  3. Pain Management: Hydrocodone and Robaxin as needed 4. Mood: Provide emotional support 5. Neuropsych: This patient iscapable of making decisions on hisown behalf. 6. Skin/Wound Care: Routine skin checks 7. Fluids/Electrolytes/Nutrition: Routine I&Os  BMP within acceptable range on 6/6 8.Acute blood loss anemia.   Hb 9.3 on 6/6 9.Hypertension.   Toprol-XL 50 mg daily.   Patient also on lisinopril 5 mg daily PTA, will resume as needed  Monitor with increased mobility  Controlled 6/10 10.Hyperlipidemia. Pravachol 11. Hypoalbuminemia  Supplement initiated 6/6 12. Bowel incontinence  After several days of constipation  Cont to monitor, bowel meds adjusted, continues to empty  Improving 13. Nocturnal enuresis  At bedtime, will perform timed voiding prior to  sleep  Improving  LOS (Days) 5 A FACE TO FACE EVALUATION WAS PERFORMED  Ankit Lorie Phenix 04/06/2017 8:17 AM

## 2017-04-07 ENCOUNTER — Inpatient Hospital Stay (HOSPITAL_COMMUNITY): Payer: Medicare Other | Admitting: Occupational Therapy

## 2017-04-07 ENCOUNTER — Inpatient Hospital Stay (HOSPITAL_COMMUNITY): Payer: Medicare Other | Admitting: Physical Therapy

## 2017-04-07 NOTE — Progress Notes (Signed)
Occupational Therapy Session Note  Patient Details  Name: [name] MRN: [id number] Date of Birth: 16-Nov-1931  Today's Date: 04/07/2017 OT Individual Time: 2119-4174 OT Individual Time Calculation (min): 53 min    Short Term Goals: Week 1:  OT Short Term Goal 1 (Week 1): Pt will complete LB bathing with min assist sit to stand using AE. OT Short Term Goal 2 (Week 1): Pt will complete LB dressing sit to stand with min assist using AE PRN. OT Short Term Goal 3 (Week 1): Pt will complete shower transfers with min assist during ADL in room. OT Short Term Goal 4 (Week 1): Pt will maintain standing balance with min assist while managing clothing during toileting.    Skilled Therapeutic Interventions/Progress Updates:    Pt completed grooming tasks from seated position at the sink.  He stated he had just put on clean shorts and shirt earlier this am and was not interested in washing up, but did want to shave.  Overall, supervision for shaving, washing his face, and brushing his teeth.  He was then able to propel himself half way down to the therapy gym with supervision.  Had him complete 2 intervals of 4 mins on the UE erongometer with resistance on level 8 for the first set and level 12 for the second set.  He was able to maintain speed at 30-32 on level 8 and then 26-28 on level 12 with HR increasing to 96 BPM and O2 at 96 as well.  Finished session with therapist pushing him back to the room.  Pt left at bedside in wheelchair with safety belt in place and call button and phone in reach.    Therapy Documentation Precautions:  Precautions Precautions: Fall Restrictions Weight Bearing Restrictions: Yes LLE Weight Bearing: Partial weight bearing LLE Partial Weight Bearing Percentage or Pounds: 25%  Pain: Pain Assessment Pain Assessment: Faces Pain Score: 3  Faces Pain Scale: Hurts little more Pain Type: Surgical pain Pain Location: Leg Pain Orientation: Left Pain Onset: With  Activity Pain Intervention(s): Repositioned ADL: See Function Navigator for Current Functional Status.   Therapy/Group: Individual Therapy  Christropher Gintz OTR/L 04/07/2017, 12:46 PM

## 2017-04-07 NOTE — Progress Notes (Signed)
[facility] PHYSICAL MEDICINE & REHABILITATION     PROGRESS NOTE  Subjective/Complaints:  Pt seen laying in bed this AM.  He asks me to make sure his breakfast is what he ordered and then to make him more comfortable in bed to eat.   ROS: Denies CP, SOB, N/V/D.  Objective: Vital Signs: Blood pressure 119/75, pulse 82, temperature 97.7 F (36.5 C), temperature source Oral, resp. rate 18, height 5\' 8"  (1.727 m), weight 68 kg (150 lb), SpO2 97 %. No results found. No results for input(s): WBC, HGB, HCT, PLT in the last 72 hours. No results for input(s): NA, K, CL, GLUCOSE, BUN, CREATININE, CALCIUM in the last 72 hours.  Invalid input(s): CO CBG (last 3)  No results for input(s): GLUCAP in the last 72 hours.  Wt Readings from Last 3 Encounters:  04/06/17 68 kg (150 lb)  04/01/17 67.1 kg (148 lb)  01/29/17 67.1 kg (148 lb)    Physical Exam:  BP 119/75 (BP Location: Left Arm)   Pulse 82   Temp 97.7 F (36.5 C) (Oral)   Resp 18   Ht 5\' 8"  (1.727 m)   Wt 68 kg (150 lb)   SpO2 97%   BMI 22.81 kg/m  Constitutional: He appears well-developed. NAD. HENT: Normocephalic. Atraumatic Eyes: EOMare normal. No discharge. Cardiovascular: RRR. No JVD. Respiratory: Effort normal and breath sounds normal.  GI: Soft. Bowel sounds are normal.  Musculoskeletal:  LLE edema.  Neurological: He is alertand oriented 5/5 B/l UE motor.  RLE 4/5 proximally, 5/5 distally.  LLE: HF 2/5,  KE 2+/5, ADF/PF 4+/5 (stable) Psychiatric: He has a normal mood and affect. His behavior is normal.  Skin. Surgical site clean and dry appropriately tender   Assessment/Plan: 1. Functional deficits secondary to Left intertrochanteric hip fracture which require 3+ hours per day of interdisciplinary therapy in a comprehensive inpatient rehab setting. Physiatrist is providing close team supervision and 24 hour management of active medical problems listed below. Physiatrist and rehab team continue to assess  barriers to discharge/monitor patient progress toward functional and medical goals.  Function:  Bathing Bathing position   Position: Wheelchair/chair at sink  Bathing parts Body parts bathed by patient: Right arm, Left arm, Chest, Abdomen, Front perineal area, Right upper leg, Left upper leg, Right lower leg Body parts bathed by helper: Buttocks, Left lower leg, Back  Bathing assist        Upper Body Dressing/Undressing Upper body dressing   What is the patient wearing?: Pull over shirt/dress     Pull over shirt/dress - Perfomed by patient: Put head through opening, Thread/unthread left sleeve, Pull shirt over trunk          Upper body assist        Lower Body Dressing/Undressing Lower body dressing   What is the patient wearing?: Non-skid slipper socks Underwear - Performed by patient: Thread/unthread right underwear leg, Thread/unthread left underwear leg Underwear - Performed by helper: Pull underwear up/down Pants- Performed by patient: Thread/unthread right pants leg, Thread/unthread left pants leg Pants- Performed by helper: Pull pants up/down Non-skid slipper socks- Performed by patient: Don/doff right sock, Don/doff left sock Non-skid slipper socks- Performed by helper: Don/doff right sock               TED Hose - Performed by helper: Don/doff right TED hose, Don/doff left TED hose  Lower body assist Assist for lower body dressing: Touching or steadying assistance (Pt > 75%)      Toileting Toileting  Toileting activity did not occur: No continent bowel/bladder event Toileting steps completed by patient: Performs perineal hygiene Toileting steps completed by helper: Adjust clothing prior to toileting, Adjust clothing after toileting Toileting Assistive Devices: Grab bar or rail, Toilet aid  Toileting assist Assist level: Two helpers   Transfers Chair/bed transfer   Chair/bed transfer method: Squat pivot Chair/bed transfer assist level: Maximal assist (Pt  25 - 49%/lift and lower) Chair/bed transfer assistive device: Armrests, Medical sales representative     Max distance: 5 Assist level: Maximal assist (Pt 25 - 49%)   Wheelchair   Type: Manual Max wheelchair distance: 50 ft Assist Level: Supervision or verbal cues  Cognition Comprehension Comprehension assist level: Follows basic conversation/direction with no assist  Expression Expression assist level: Expresses complex ideas: With no assist  Social Interaction Social Interaction assist level: Interacts appropriately with others - No medications needed.  Problem Solving Problem solving assist level: Solves basic 90% of the time/requires cueing < 10% of the time  Memory Memory assist level: Recognizes or recalls 90% of the time/requires cueing < 10% of the time    Medical Problem List and Plan: 1. Decreased functional mobilitysecondary to left intertrochanteric hip fracture status post IM nailing on 03/30/17.   Partial weight bearing 25 pounds  Cont CIR 2. DVT Prophylaxis/Anticoagulation: Xarelto.  Vascular study neg for DVT  3. Pain Management: Hydrocodone and Robaxin as needed 4. Mood: Provide emotional support 5. Neuropsych: This patient iscapable of making decisions on hisown behalf. 6. Skin/Wound Care: Routine skin checks 7. Fluids/Electrolytes/Nutrition: Routine I&Os  BMP within acceptable range on 6/6  Labs ordered for tomorrow 8.Acute blood loss anemia.   Hb 9.3 on 6/6  Labs ordered for tomorrow 9.Hypertension.   Toprol-XL 50 mg daily.   Patient also on lisinopril 5 mg daily PTA, will resume as needed  Monitor with increased mobility  Controlled 6/11 10.Hyperlipidemia. Pravachol 11. Hypoalbuminemia  Supplement initiated 6/6 12. Bowel incontinence  After several days of constipation  Cont to monitor, bowel meds adjusted, continues to empty  Improving 13. Nocturnal enuresis  At bedtime, will perform timed voiding prior to sleep  Improving  LOS  (Days) 6 A FACE TO FACE EVALUATION WAS PERFORMED  [name] Lorie Phenix 04/07/2017 9:06 AM

## 2017-04-07 NOTE — Progress Notes (Signed)
Physical Therapy Session Note  Patient Details  Name: [name] MRN: [id number] Date of Birth: 05-01-1932  Today's Date: 04/07/2017 PT Individual Time: 0900-1000 PT Individual Time Calculation (min): 60 min   Short Term Goals: Week 1:  PT Short Term Goal 1 (Week 1): Pt to perform supine<>sitting with min assist.  PT Short Term Goal 2 (Week 1): Pt to perfrom sit<>stand with rw and mod assist PT Short Term Goal 3 (Week 1): Pt to ambulate 10 ft with rw while maintaining weight bearing restrictions.  PT Short Term Goal 4 (Week 1): Pt to tolerate Lt hip flexion to 60 degrees for improved ADLs.   Skilled Therapeutic Interventions/Progress Updates:    Pt in bed upon arrival, agreeable to PT session. Pt requests using BSC before starting session. Pt assisted to [facility] where small BM was had (nursing notified). Bed Mobility: supine>sitting mod assist with cues for hand placement and use of rail. PT assisting with LLE and trunk. Transfers: Stand-pivot performed with mod assist bed>BSC>w/c (cues for sequence provided). Propelling w/c 100 ft with min assist due to drifting Rt, 3 rest breaks taken. Ambulation: 20 ft X1 with rw, mod assist. PT assisting with advancing LLE and weightbearing when advancing RLE. Exercise: seated LAQ with mod assist, isometric hip abd/add - each 1X10. Following session, pt returned to room, up in w/c with all needs in reach. Quick release belt on .  Session 2: Pt sitting in w/c upon arrival, agreeable to PT session. Pt propelling w/c to ortho gym with min assist to avoid drifting Rt. Pt ambulating 15 ft with rw, intermittent assist with advancing LLE. Sitting<>supine mod assist from mat table. Exercise: ankle pumps, assisted range into knee and hip extension, quad sets, heel slides, hip abd/add (mod assist), SAQ min assist, seated knee flexion, LAQ. All exercises performed 1X10. Sit <>stand X3 from w/c with emphasis on forward lean and hand placement. Following session, pt up in w/c  with all needs in reach.   Therapy Documentation Precautions:  Precautions Precautions: Fall Restrictions Weight Bearing Restrictions: Yes LLE Weight Bearing: Partial weight bearing LLE Partial Weight Bearing Percentage or Pounds: 25    Pain:  3/10 - LLE, monitored during session.   See Function Navigator for Current Functional Status.   Therapy/Group: Individual Therapy  Linard Millers, PT 04/07/2017, 9:32 AM

## 2017-04-08 ENCOUNTER — Inpatient Hospital Stay (HOSPITAL_COMMUNITY): Payer: Medicare Other | Admitting: Occupational Therapy

## 2017-04-08 ENCOUNTER — Inpatient Hospital Stay (HOSPITAL_COMMUNITY): Payer: Medicare Other | Admitting: Physical Therapy

## 2017-04-08 DIAGNOSIS — R799 Abnormal finding of blood chemistry, unspecified: Secondary | ICD-10-CM

## 2017-04-08 LAB — BASIC METABOLIC PANEL
Anion gap: 6 (ref 5–15)
BUN: 25 mg/dL — AB (ref 6–20)
CHLORIDE: 102 mmol/L (ref 101–111)
CO2: 27 mmol/L (ref 22–32)
CREATININE: 1.07 mg/dL (ref 0.61–1.24)
Calcium: 8.5 mg/dL — ABNORMAL LOW (ref 8.9–10.3)
GFR calc Af Amer: >60 mL/min (ref >60)
GFR calc non Af Amer: >60 mL/min (ref >60)
Glucose, Bld: 99 mg/dL (ref 65–99)
Potassium: 4 mmol/L (ref 3.5–5.1)
Sodium: 135 mmol/L (ref 135–145)

## 2017-04-08 LAB — CBC WITH DIFFERENTIAL/PLATELET
Basophils Absolute: 0 10*3/uL (ref 0.0–0.1)
Basophils Relative: 0 %
EOS ABS: 0.8 10*3/uL — AB (ref 0.0–0.7)
EOS PCT: 9 %
HCT: 28.4 % — ABNORMAL LOW (ref 39.0–52.0)
Hemoglobin: 9.4 g/dL — ABNORMAL LOW (ref 13.0–17.0)
LYMPHS ABS: 1.7 10*3/uL (ref 0.7–4.0)
Lymphocytes Relative: 20 %
MCH: 31.8 pg (ref 26.0–34.0)
MCHC: 33.1 g/dL (ref 30.0–36.0)
MCV: 95.9 fL (ref 78.0–100.0)
MONO ABS: 0.6 10*3/uL (ref 0.1–1.0)
MONOS PCT: 7 %
Neutro Abs: 5.4 10*3/uL (ref 1.7–7.7)
Neutrophils Relative %: 64 %
PLATELETS: 314 10*3/uL (ref 150–400)
RBC: 2.96 MIL/uL — ABNORMAL LOW (ref 4.22–5.81)
RDW: 13.3 % (ref 11.5–15.5)
WBC: 8.4 10*3/uL (ref 4.0–10.5)

## 2017-04-08 NOTE — Progress Notes (Signed)
Occupational Therapy Weekly Progress Note  Patient Details  Name: [name] MRN: [id number] Date of Birth: [birth year]  Beginning of progress report period: April 01, 2017 End of progress report period: April 08, 2017  Today's Date: 04/08/2017 OT Individual Time: 1446-1531 OT Individual Time Calculation (min): 45 min    Patient has met 0 of 4 short term goals.  Pt continues to make slow but steady progress with OT at this time.  He is able to complete UB selfcare with supervision and LB selfcare with mod assist.  Mod assist also needed for toilet and walk-in shower transfers using the RW for support.  [name] continues to need mod demonstrational cueing for sequencing sit to stand in order to maintain TDWBing status in the LLE.  Based on current progress and anticipation of not having 24 hour supervision, feel pt will likely need SNF for follow-up as goals are currently set for supervision level.  Will continue with current OT POC and discuss with team on discharge situation.    Patient continues to demonstrate the following deficits: muscle weakness, decreased memory and decreased standing balance and difficulty maintaining precautions and therefore will continue to benefit from skilled OT intervention to enhance overall performance with BADL.  Patient progressing toward long term goals..  Continue plan of care.  OT Short Term Goals Week 2:  OT Short Term Goal 1 (Week 2): Pt will complete LB bathing with min assist sit to stand using AE. OT Short Term Goal 2 (Week 2): Pt will maintain standing balance with min assist while managing clothing during toileting.   OT Short Term Goal 3 (Week 2): Pt will complete LB dressing sit to stand with min assist using AE PRN. OT Short Term Goal 4 (Week 2): Pt will complete shower transfers with min assist during ADL in room.  Skilled Therapeutic Interventions/Progress Updates:    Pt worked on functional transfers from wheelchair to mat with mod assist.   Also worked on sit to stand from the [employer] with mod assist from varying heights.  Pt needs mod demonstrational cueing for sequencing sit to stand as well as for functional transfers.  Therapist kept his foot under the pt's LLE and he was able to adhere to [place].  Finished session with pt propelling to the room with supervision using the wheelchair and transferred back to the bed with mod assist.  Call button and phone in reach.  Bed alarm in place as well.    Therapy Documentation Precautions:  Precautions Precautions: Fall Restrictions Weight Bearing Restrictions: Yes LLE Weight Bearing: Partial weight bearing LLE Partial Weight Bearing Percentage or Pounds: 25%  Pain: Pain Assessment Pain Assessment: Faces Faces Pain Scale: Hurts little more Pain Type: Surgical pain Pain Location: Leg Pain Orientation: Left Pain Descriptors / Indicators: Discomfort Pain Onset: With Activity Pain Intervention(s): Medication (See eMAR);Repositioned ADL: See Function Navigator for Current Functional Status.   Therapy/Group: Individual Therapy  Emile Ringgenberg OTR/L 04/08/2017, 4:34 PM

## 2017-04-08 NOTE — Progress Notes (Signed)
Physical Therapy Session Note  Patient Details  Name: [name] MRN: [id number] Date of Birth: Sep 12, 1932  Today's Date: 04/08/2017 PT Individual Time: 0800-0900, 1400-1445 PT Individual Time Calculation (min): 60 min, 45 min  Short Term Goals: Week 1:  PT Short Term Goal 1 (Week 1): Pt to perform supine<>sitting with min assist.  PT Short Term Goal 2 (Week 1): Pt to perfrom sit<>stand with rw and mod assist PT Short Term Goal 3 (Week 1): Pt to ambulate 10 ft with rw while maintaining weight bearing restrictions.  PT Short Term Goal 4 (Week 1): Pt to tolerate Lt hip flexion to 60 degrees for improved ADLs.   Skilled Therapeutic Interventions/Progress Updates:    Session 1: Pt in bed upon arrival, agreeable to PT session. Exercises performed in bed including: ankle pumps, QS, heel slides, SAQ, hip abd/add - all 1X10 with assist provided. Bed mobility: mod assist with LE/trunk to come to sitting EOB- using rail with UEs. Transfers: sit<>stand from bed and w/c with mod assist. Reinforcing 25% weightbearing on LLE. Ambulation performed x25 ft X1, 5 ftX1, 10 ft X1 with mod assist and rw, cues for sequence. Following session, pt up in w/c with all needs in reach.  Session 2: Pt sitting in w/c upon arrival, agreeable to PT session. Requesting to use bathroom prior to session. Pt ambulating from w/c to toilet with mod assist, cues for sequence. Following BM, pt requiring total assist for hygiene as well as pulling brief and shorts up. Returned to w/c and pt propelling w/c in hall with intermittent assist for maintaining straight line. Pt ambulating 24 ft in gym using rw and mod assist. Cues provided for hand placement with transfers as well as LLE placement. Seated hip abduction, adduction, knee extension (all 1X10). Following session, pt returned to room, up in w/c with all needs in reach.   Therapy Documentation Precautions:  Precautions Precautions: Fall Restrictions Weight Bearing Restrictions:  Yes LLE Weight Bearing: Partial weight bearing LLE Partial Weight Bearing Percentage or Pounds: 25% Pain: No pain at rest, increases with activity through LLE. Pt refusing pain medication.    See Function Navigator for Current Functional Status.   Therapy/Group: Individual Therapy  Linard Millers, PT 04/08/2017, 12:13 PM

## 2017-04-08 NOTE — Progress Notes (Signed)
[facility] PHYSICAL MEDICINE & REHABILITATION     PROGRESS NOTE  Subjective/Complaints:  Pt seen laying in bed this AM.  He slept well overnight.  Working with PT this Am.    ROS: Denies CP, SOB, N/V/D.  Objective: Vital Signs: Blood pressure (!) 110/55, pulse 88, temperature 97.9 F (36.6 C), temperature source Oral, resp. rate 17, height 5\' 8"  (1.727 m), weight 68 kg (150 lb), SpO2 97 %. No results found.  Recent Labs  04/08/17 0512  WBC 8.4  HGB 9.4*  HCT 28.4*  PLT 314    Recent Labs  04/08/17 0512  NA 135  K 4.0  CL 102  GLUCOSE 99  BUN 25*  CREATININE 1.07  CALCIUM 8.5*   CBG (last 3)  No results for input(s): GLUCAP in the last 72 hours.  Wt Readings from Last 3 Encounters:  04/06/17 68 kg (150 lb)  04/01/17 67.1 kg (148 lb)  01/29/17 67.1 kg (148 lb)    Physical Exam:  BP (!) 110/55   Pulse 88   Temp 97.9 F (36.6 C) (Oral)   Resp 17   Ht 5\' 8"  (1.727 m)   Wt 68 kg (150 lb)   SpO2 97%   BMI 22.81 kg/m  Constitutional: He appears well-developed. NAD. HENT: Normocephalic. Atraumatic Eyes: EOMare normal. No discharge. Cardiovascular: RRR. No JVD. Respiratory: Effort normal and breath sounds normal.  GI: Soft. Bowel sounds are normal.  Musculoskeletal:  LLE edema.  Neurological: He is alertand oriented 5/5 B/l UE motor.  RLE 4/5 proximally, 5/5 distally.  LLE: HF 2/5,  KE 2+/5, ADF/PF 4+/5 (stable) Psychiatric: He has a normal mood and affect. His behavior is normal.  Skin. Surgical site with sutures c/d/i   Assessment/Plan: 1. Functional deficits secondary to Left intertrochanteric hip fracture which require 3+ hours per day of interdisciplinary therapy in a comprehensive inpatient rehab setting. Physiatrist is providing close team supervision and 24 hour management of active medical problems listed below. Physiatrist and rehab team continue to assess barriers to discharge/monitor patient progress toward functional and medical  goals.  Function:  Bathing Bathing position   Position: Wheelchair/chair at sink  Bathing parts Body parts bathed by patient: Right arm, Left arm, Chest, Abdomen, Front perineal area, Right upper leg, Left upper leg, Right lower leg Body parts bathed by helper: Buttocks, Left lower leg, Back  Bathing assist        Upper Body Dressing/Undressing Upper body dressing   What is the patient wearing?: Pull over shirt/dress     Pull over shirt/dress - Perfomed by patient: Put head through opening, Thread/unthread left sleeve, Pull shirt over trunk          Upper body assist Assist Level: Set up, Supervision or verbal cues      Lower Body Dressing/Undressing Lower body dressing   What is the patient wearing?: Non-skid slipper socks, Ted Hose Underwear - Performed by patient: Thread/unthread right underwear leg, Thread/unthread left underwear leg Underwear - Performed by helper: Pull underwear up/down Pants- Performed by patient: Thread/unthread right pants leg, Thread/unthread left pants leg Pants- Performed by helper: Pull pants up/down Non-skid slipper socks- Performed by patient: Don/doff right sock, Don/doff left sock Non-skid slipper socks- Performed by helper: Don/doff right sock               TED Hose - Performed by helper: Don/doff right TED hose, Don/doff left TED hose  Lower body assist Assist for lower body dressing: Touching or steadying assistance (Pt >  75%)      Toileting Toileting Toileting activity did not occur: No continent bowel/bladder event Toileting steps completed by patient: Performs perineal hygiene Toileting steps completed by helper: Adjust clothing prior to toileting, Performs perineal hygiene, Adjust clothing after toileting Toileting Assistive Devices: Other (comment) (walker)  Toileting assist Assist level: Two helpers   Transfers Chair/bed transfer   Chair/bed transfer method: Stand pivot Chair/bed transfer assist level: Moderate assist  (Pt 50 - 74%/lift or lower) Chair/bed transfer assistive device: Armrests, Medical sales representative     Max distance: 20 ft Assist level: Moderate assist (Pt 50 - 74%)   Wheelchair   Type: Manual Max wheelchair distance: 100 ft Assist Level: Touching or steadying assistance (Pt > 75%)  Cognition Comprehension Comprehension assist level: Follows complex conversation/direction with no assist  Expression Expression assist level: Expresses complex ideas: With no assist  Social Interaction Social Interaction assist level: Interacts appropriately with others - No medications needed.  Problem Solving Problem solving assist level: Solves complex problems: With extra time  Memory Memory assist level: Recognizes or recalls 90% of the time/requires cueing < 10% of the time    Medical Problem List and Plan: 1. Decreased functional mobilitysecondary to left intertrochanteric hip fracture status post IM nailing on 03/30/17.   Partial weight bearing 25 pounds  Cont CIR 2. DVT Prophylaxis/Anticoagulation: Xarelto.  Vascular study neg for DVT  3. Pain Management: Hydrocodone and Robaxin as needed 4. Mood: Provide emotional support 5. Neuropsych: This patient iscapable of making decisions on hisown behalf. 6. Skin/Wound Care: Routine skin checks 7. Fluids/Electrolytes/Nutrition: Routine I&Os  BMP within acceptable range on 6/12, slightly elevated BUN  Encourage fluids 8.Acute blood loss anemia.   Hb 9.4 on 6/12 9.Hypertension.   Toprol-XL 50 mg daily.   Patient also on lisinopril 5 mg daily PTA, will resume as needed  Monitor with increased mobility  Controlled 6/12 10.Hyperlipidemia. Pravachol 11. Hypoalbuminemia  Supplement initiated 6/6 12. Bowel incontinence  After several days of constipation  Cont to monitor, bowel meds adjusted, continues to empty  Improving 13. Nocturnal enuresis  At bedtime, will perform timed voiding prior to sleep  Improving   LOS (Days)  7 A FACE TO FACE EVALUATION WAS PERFORMED  Viveca Beckstrom Lorie Phenix 04/08/2017 10:17 AM

## 2017-04-08 NOTE — Progress Notes (Signed)
Occupational Therapy Session Note  Patient Details  Name: [name] MRN: [id number] Date of Birth: 02-23-1932  Today's Date: 04/08/2017 OT Individual Time: 1105-1200 OT Individual Time Calculation (min): 55 min    Short Term Goals: Week 1:  OT Short Term Goal 1 (Week 1): Pt will complete LB bathing with min assist sit to stand using AE. OT Short Term Goal 2 (Week 1): Pt will complete LB dressing sit to stand with min assist using AE PRN. OT Short Term Goal 3 (Week 1): Pt will complete shower transfers with min assist during ADL in room. OT Short Term Goal 4 (Week 1): Pt will maintain standing balance with min assist while managing clothing during toileting.    Skilled Therapeutic Interventions/Progress Updates:    Pt completed shower and dressing during session.  Max assist for stand pivot transfer to the tub bench from the wheelchair, with use of the RW.  Mod instructional cueing for technique in order to adhere to [place] status.   Supervision for UB bathing with mod assist for LB bathing during sit to stand with use of the grab bar.  He was able to transfer out to the wheelchair with max assist for dressing tasks at the sink.  Mod assist for LB dressing with use of the reacher to thread undergarment protector and pants over the LLE.  Mod assist for sit to stand to pull them over hips.  He utilized the sockaide with supervision for donning gripper socks.  Finished session with pt sitting in wheelchair with call button and phone in reach and safety belt in place.    Therapy Documentation Precautions:  Precautions Precautions: Fall Restrictions Weight Bearing Restrictions: Yes LLE Weight Bearing: Partial weight bearing LLE Partial Weight Bearing Percentage or Pounds: 25%  Pain: Pain Assessment Pain Assessment: Faces Faces Pain Scale: Hurts a little bit Pain Type: Surgical pain Pain Location: Leg Pain Orientation: Left Pain Descriptors / Indicators: Discomfort Pain Onset: With  Activity Pain Intervention(s): Repositioned;Emotional support ADL: See Function Navigator for Current Functional Status.   Therapy/Group: Individual Therapy  Alexes Lamarque OTR/L 04/08/2017, 12:38 PM

## 2017-04-09 ENCOUNTER — Inpatient Hospital Stay (HOSPITAL_COMMUNITY): Payer: Medicare Other | Admitting: Occupational Therapy

## 2017-04-09 ENCOUNTER — Inpatient Hospital Stay (HOSPITAL_COMMUNITY): Payer: Medicare Other | Admitting: Physical Therapy

## 2017-04-09 NOTE — Patient Care Conference (Signed)
Inpatient RehabilitationTeam Conference and Plan of Care Update Date: 04/09/2017   Time: 10:00 AM    Patient Name: [name]      Medical Record Number: [id number]  Date of Birth: 12-07-1931 Sex: Male         Room/Bed: 4M10C/4M10C-01 Payor Info: Payor: MEDICARE / Plan: MEDICARE PART A AND B / Product Type: *No Product type* /    Admitting Diagnosis: L IT HIP fracture  Admit Date/Time:  04/01/2017  4:07 PM Admission Comments: No comment available   Primary Diagnosis:  Closed intertrochanteric fracture of left hip (HCC) Principal Problem: Closed intertrochanteric fracture of left hip [facility])  Patient Active Problem List   Diagnosis Date Noted  . Elevated BUN   . Nocturnal enuresis   . Fall   . Full incontinence of feces   . Benign essential HTN   . Post-operative pain   . Hypoalbuminemia due to protein-calorie malnutrition ([place])   . Abnormality of gait   . Closed intertrochanteric fracture of left hip ([place]) 04/01/2017  . Acute blood loss anemia   . Leg edema   . Hip fracture ([place]) 03/30/2017  . Leukocytosis 03/29/2017  . Closed left femoral fracture ([place]) 03/29/2017  . Dehydration 03/29/2017  . Closed left hip fracture ([place]) 03/29/2017  . Mild intermittent asthma with acute exacerbation 01/29/2017  . Chronic renal insufficiency 09/22/2015  . Tachycardia 09/22/2015  . PVC (premature ventricular contraction) 09/22/2015  . Osteoporosis/osteopenia increased risk 06/03/2014  . Basal cell carcinoma of face 06/03/2014  . BPH (benign prostatic hypertrophy) 05/27/2013  . Eczema 04/23/2012  . HTN (hypertension) 04/23/2012  . Dyslipidemia 04/23/2012    Expected Discharge Date: Expected Discharge Date: 04/18/17 (vs SNF)  Team Members Present: Physician leading conference: Dr. Delice Lesch Social Worker Present: Alfonse Alpers, LCSW Nurse Present: Rayetta Humphrey, RN PT Present: Other (comment) Leavy Cella, PT) OT Present: Clyda Greener, OT PPS Coordinator present : Daiva Nakayama, RN,  CRRN     Current Status/Progress Goal Weekly Team Focus  Medical   Decreased functional mobility secondary to left intertrochanteric hip fracture status post IM nailing on 03/30/17  Improve mobility, transfers  See above   Bowel/Bladder   continent of bowel; sometimes incontinent of bladder at night; LBM 06/11  urinal use q4 during shift with min assist  encourage bladder emptying before bed q shift   Swallow/Nutrition/ Hydration             ADL's   Supervision for UB selfcare,  mod assist for LB selfcare and stand pivot transfers,  supervision overall  selfcare retraining, balance retraining, functional transfers, pt/family education, therapeutic exercise   Mobility   mod assist with bed mobility and transfers, ambulating 25 ft with rw and mod assist - reinforcing 25% WB.   supervision level  LLE strengthening/ROM, bed mobility, transfers, ambulation. Reinforcing safety and mobility technique.    Communication             Safety/Cognition/ Behavioral Observations            Pain   pain with activity; norco 1-2 tabs q6 prn  <3  assess for pain q shift and prn   Skin   2 dry gauze dressings to L hip with sutures (clean, dry, intact- no drainage); eczema (tenalog); brusing to L side of penis and scrotum; new brusing to inner L thigh  skin free of infection and breakdown with min assist  assess skin q shift and prn    Rehab Goals Patient on target to  meet rehab goals: Yes Rehab Goals Revised: No goals have been revised, but pt is making slow progress due to cognition. *See Care Plan and progress notes for long and short-term goals.  Barriers to Discharge: Mobility, transfers, safety    Possible Resolutions to Barriers:  Therapies, pt and family education    Discharge Planning/Teaching Needs:  Pt's family is deciding between home with private duty vs. SNF.  Family/private duty caregiver to come in for training if plan is for home d/c.   Team Discussion:  Pt's BP is stable, but  pt has some expected blood loss anemia.  Bowel and bladder are improving.  Pt has pain and burning with movement, but does not want to take anything for it.  Sutures are dry and pt is a DNR.  Pt is supervision for UB bathing and dressing, but mod for LB.  Mod to max with stand/pivot transfers and walking and needs mod cues to maintain TDWB status.  Pt needs 24/7 supervision due to no safety awareness.  Revisions to Treatment Plan:  none   Continued Need for Acute Rehabilitation Level of Care: The patient requires daily medical management by a physician with specialized training in physical medicine and rehabilitation for the following conditions: Daily direction of a multidisciplinary physical rehabilitation program to ensure safe treatment while eliciting the highest outcome that is of practical value to the patient.: Yes Daily medical management of patient stability for increased activity during participation in an intensive rehabilitation regime.: Yes Daily analysis of laboratory values and/or radiology reports with any subsequent need for medication adjustment of medical intervention for : Post surgical problems;Blood pressure problems;Other  [name], Silvestre Mesi 04/09/2017, 12:40 PM

## 2017-04-09 NOTE — Discharge Summary (Signed)
Physician Discharge Summary  [name] ALP:[id number] DOB: 07-07-1932 DOA: 03/29/2017  PCP: Wardell Honour, MD  Admit date: 03/29/2017 Discharge date: 04/01/2017  Time spent: 35 minutes  Recommendations for Outpatient Follow-up:  1. Ortho Dr.Dean in 2weeks 2. CIR for Rehab 3. Started on Xarelto for DVT prophylaxis after Hip fracture-stop this in 3-4weeks   Discharge Diagnoses:    Closed left femoral fracture (HCC)   Dehydration   Closed left hip fracture (HCC)   Hip fracture (HCC)   Acute blood loss anemia   Leg edema   HTN (hypertension)   Dyslipidemia   Chronic renal insufficiency   Leukocytosis    Discharge Condition: stable  Diet recommendation:heart healthy  Filed Weights   04/01/17 1159  Weight: 67.1 kg (148 lb)    History of present illness:  Wesley Hodgeis a 83 y.o.malewith medical history significant of HTN,HLD, Eczema, CKD stage 2, chronic leg edema, Hx of Bigeminy. Presented to the ED after a mechanical fall, noted to have a Hip Fracture  Hospital Course:  . Closed left femoral fracture ([place])  -Orthopedics consulted, s/p IM Nail 6/3 per Dr.Dean -started on Xarelto per Dr.Dean for DVT prophylaxis -PT eval completed, CIR recommended -discharged to CIR for Rehab  ACute post op Blood loss anemia -Hb drop 2grams post op -stable since     HTN -Stable, continue metoprolol, stopped lisinopril due to soft BPs  . Dyslipidemia  - stable continue home medications  . Leukocytosis -reactive, afebrile  H/o CKD 2 -creatinine in normal range, monitor  Procedures: 6/3: PROCEDURE:  Left hip intertrochanteric fracture open reduction and internal fixation using [employer], IMHS 10 x 40 cm, 125-degree left TRIGEN INTERTAN nail with 105-mm lag screw, 100 mm compression  screw.  Consultations:  Ortho   Discharge Exam: Vitals:   03/31/17 2015 04/01/17 0500  BP: (!) 103/49 (!) 106/55  Pulse: 60 (!) 56  Resp: 16 16  Temp: 98.6 F (37 C) 97.7 F  (36.5 C)    General: AAOx3 Cardiovascular: S1S2/RRR Respiratory: CTAB  Discharge Instructions   Discharge Instructions    Diet - low sodium heart healthy    Complete by:  As directed      Discharge Medication List as of 04/01/2017  3:50 PM    START taking these medications   Details  HYDROcodone-acetaminophen (NORCO/VICODIN) 5-325 MG tablet Take 1 tablet by mouth every 6 (six) hours as needed for moderate pain., Starting Mon 03/31/2017, Print    rivaroxaban (XARELTO) 10 MG TABS tablet Take 1 tablet (10 mg total) by mouth daily., Starting Tue 04/01/2017, Normal      CONTINUE these medications which have NOT CHANGED   Details  albuterol (PROVENTIL HFA;VENTOLIN HFA) 108 (90 Base) MCG/ACT inhaler Inhale 2 puffs into the lungs every 6 (six) hours as needed for wheezing or shortness of breath (cough, shortness of breath or wheezing.)., Starting Wed 01/29/2017, Normal    metoprolol succinate (TOPROL-XL) 50 MG 24 hr tablet Take 1 tablet (50 mg total) by mouth daily., Starting Tue 06/11/2016, Normal    pravastatin (PRAVACHOL) 40 MG tablet TAKE 1 TABLET ONCE DAILY., Normal    triamcinolone cream (KENALOG) 0.1 % Apply topically 2 (two) times daily. APPLY TWICE DAILY.  1:1 RATIO PLEASE, Starting Fri 02/14/2017, Normal      STOP taking these medications     lisinopril (PRINIVIL,ZESTRIL) 5 MG tablet      furosemide (LASIX) 20 MG tablet        No Known Allergies Follow-up Information  [name], MD. Schedule an appointment as soon as possible for a visit in 2 week(s).   Specialty:  Orthopedic Surgery Contact information: [place] [address] [phone]            The results of significant diagnostics from this hospitalization (including imaging, microbiology, ancillary and laboratory) are listed below for reference.    Significant Diagnostic Studies: Dg Chest 1 View  Result Date: 03/29/2017 CLINICAL DATA:  Preop evaluation, known left hip  fracture EXAM: CHEST 1 VIEW COMPARISON:  03/19/2016 FINDINGS: Cardiac shadow is within normal limits. Elevation of the right hemidiaphragm is seen. A hiatal hernia is noted. No focal infiltrate or sizable effusion is noted. No bony abnormality is seen. IMPRESSION: No acute abnormality seen. Electronically Signed   By: Inez Catalina M.D.   On: 03/29/2017 16:38   Dg Ankle Complete Left  Result Date: 03/29/2017 CLINICAL DATA:  Fall, ankle pain EXAM: LEFT ANKLE COMPLETE - 3+ VIEW COMPARISON:  None. FINDINGS: No acute bony abnormality. Specifically, no fracture, subluxation, or dislocation. Soft tissues are intact. IMPRESSION: No acute bony abnormality. Electronically Signed   By: Rolm Baptise M.D.   On: 03/29/2017 16:39   Pelvis Portable  Result Date: 03/30/2017 CLINICAL DATA:  Status post fixation of a left intertrochanteric fracture today. The patient suffered a fracture and a fall 03/29/2017. Initial encounter. EXAM: PORTABLE PELVIS 1-2 VIEWS COMPARISON:  Plain films left hip 03/29/2017. Intraoperative imaging today. FINDINGS: Long intramedullary nail with 2 hip screws is in place. Hardware is intact. There is improved position and alignment of the patient's intertrochanteric fracture. No acute abnormality. IMPRESSION: Status post fixation of a left intertrochanteric fracture. No acute finding. Electronically Signed   By: Inge Rise M.D.   On: 03/30/2017 14:57   Dg Knee Complete 4 Views Left  Result Date: 03/29/2017 CLINICAL DATA:  Fall.  Pain EXAM: LEFT KNEE - COMPLETE 4+ VIEW COMPARISON:  None. FINDINGS: No acute bony abnormality. Specifically, no fracture, subluxation, or dislocation. Soft tissues are intact. No joint effusion. IMPRESSION: No acute bony abnormality. Electronically Signed   By: Rolm Baptise M.D.   On: 03/29/2017 16:38   Dg C-arm 1-60 Min  Result Date: 03/30/2017 CLINICAL DATA:  ORIF left femur fracture. EXAM: DG C-ARM 61-120 MIN; LEFT FEMUR 2 VIEWS COMPARISON:  03/29/2017 FINDINGS:  Four intraoperative spot films of the left femur are submitted postoperatively for interpretation. Placement of an intramedullary nail and screws noted traversing a intertrochanteric left femur fracture, in near-anatomic alignment and position. No definite complicating features noted. IMPRESSION: ORIF proximal left femur fracture, in near-anatomic alignment and position. Electronically Signed   By: Margarette Canada M.D.   On: 03/30/2017 13:05   Dg Hip Unilat With Pelvis 2-3 Views Left  Result Date: 03/29/2017 CLINICAL DATA:  Set fall, hip pain EXAM: DG HIP (WITH OR WITHOUT PELVIS) 2-3V LEFT COMPARISON:  None. FINDINGS: There is a comminuted left femoral intertrochanteric fracture. Varus angulation. No subluxation or dislocation. IMPRESSION: Comminuted left femoral intertrochanteric fracture with varus angulation. Electronically Signed   By: Rolm Baptise M.D.   On: 03/29/2017 16:38   Dg Femur Min 2 Views Left  Result Date: 03/30/2017 CLINICAL DATA:  ORIF left femur fracture. EXAM: DG C-ARM 61-120 MIN; LEFT FEMUR 2 VIEWS COMPARISON:  03/29/2017 FINDINGS: Four intraoperative spot films of the left femur are submitted postoperatively for interpretation. Placement of an intramedullary nail and screws noted traversing a intertrochanteric left femur fracture, in near-anatomic alignment and position. No definite  complicating features noted. IMPRESSION: ORIF proximal left femur fracture, in near-anatomic alignment and position. Electronically Signed   By: Margarette Canada M.D.   On: 03/30/2017 13:05    Microbiology: No results found for this or any previous visit (from the past 240 hour(s)).   Labs: Basic Metabolic Panel:  Recent Labs Lab 04/08/17 0512  NA 135  K 4.0  CL 102  CO2 27  GLUCOSE 99  BUN 25*  CREATININE 1.07  CALCIUM 8.5*   Liver Function Tests: No results for input(s): AST, ALT, ALKPHOS, BILITOT, PROT, ALBUMIN in the last 168 hours. No results for input(s): LIPASE, AMYLASE in the last 168  hours. No results for input(s): AMMONIA in the last 168 hours. CBC:  Recent Labs Lab 04/08/17 0512  WBC 8.4  NEUTROABS 5.4  HGB 9.4*  HCT 28.4*  MCV 95.9  PLT 314   Cardiac Enzymes: No results for input(s): CKTOTAL, CKMB, CKMBINDEX, TROPONINI in the last 168 hours. BNP: BNP (last 3 results) No results for input(s): BNP in the last 8760 hours.  ProBNP (last 3 results) No results for input(s): PROBNP in the last 8760 hours.  CBG: No results for input(s): GLUCAP in the last 168 hours.     SignedDomenic Polite MD.  [facility] 04/09/2017, 4:22 PM

## 2017-04-09 NOTE — Progress Notes (Signed)
Occupational Therapy Session Note  Patient Details  Name: [name] MRN: [id number] Date of Birth: 30-Nov-1931  Today's Date: 04/09/2017 OT Individual Time: 9628-3662 OT Individual Time Calculation (min): 59 min    Short Term Goals: Week 2:  OT Short Term Goal 1 (Week 2): Pt will complete LB bathing with min assist sit to stand using AE. OT Short Term Goal 2 (Week 2): Pt will maintain standing balance with min assist while managing clothing during toileting.   OT Short Term Goal 3 (Week 2): Pt will complete LB dressing sit to stand with min assist using AE PRN. OT Short Term Goal 4 (Week 2): Pt will complete shower transfers with min assist during ADL in room.  Skilled Therapeutic Interventions/Progress Updates:   Pt completed grooming tasks at the sink from wheelchair level to start session.  He completed shaving and brushing his teeth with setup but needed cueing to rinse out the toothbrush as he did not put the top back on the toothpaste once he finished brushing and did not rinse out the toothbrush.  Transitioned to wheelchair mobility to the therapy orthopedic gym.  Worked on functional transfers to the mat from wheelchair and to a regular chair.  He was able to complete transfers from the therapy mat with min assist and from the wheelchair, which is slightly lower with mod assist.  Max demonstrational cueing needed for sequencing transfer, including hand placement and for moving the LLE out in front of him to maintain weightbearing status.  Pt propelled wheelchair back to the room with supervision to conclude session.  Call button and phone in reach with safety belt in place.     Therapy Documentation Precautions:  Precautions Precautions: Fall Restrictions Weight Bearing Restrictions: Yes LLE Weight Bearing: Partial weight bearing LLE Partial Weight Bearing Percentage or Pounds: 25%  Pain: Pain Assessment Pain Assessment: No/denies pain Pain Score: 6  Faces Pain Scale: Hurts  little more Pain Type: Acute pain Pain Location: Leg Pain Orientation: Left Pain Descriptors / Indicators: Aching Pain Frequency: Intermittent Pain Onset: With Activity Patients Stated Pain Goal: 2 Pain Intervention(s): Medication (See eMAR) Multiple Pain Sites: No ADL: See Function Navigator for Current Functional Status.   Therapy/Group: Individual Therapy  Roniesha Hollingshead OTR/L 04/09/2017, 12:15 PM

## 2017-04-09 NOTE — Progress Notes (Signed)
Occupational Therapy Session Note  Patient Details  Name: [name] MRN: [id number] Date of Birth: 02-09-1932  Today's Date: 04/09/2017 OT Individual Time: 1300-1359 OT Individual Time Calculation (min): 59 min    Short Term Goals: Week 2:  OT Short Term Goal 1 (Week 2): Pt will complete LB bathing with min assist sit to stand using AE. OT Short Term Goal 2 (Week 2): Pt will maintain standing balance with min assist while managing clothing during toileting.   OT Short Term Goal 3 (Week 2): Pt will complete LB dressing sit to stand with min assist using AE PRN. OT Short Term Goal 4 (Week 2): Pt will complete shower transfers with min assist during ADL in room.  Skilled Therapeutic Interventions/Progress Updates:    Pt completed sit to stand transitions in the gym with mod assist and mod demonstrational cueing.  Worked on standing balance as well while engaged in Presenter, broadcasting with min assist.  Pt utilized the reacher to pick them up on last interval.  Transitioned to working on UE endurance and strengthening by completing 2 sets on the UE ergonometer.  First set completed on level 8 for 4 mins with RPMs maintained at 32-34 per minute.  Second set completed for 5 mins using random setting on level 5, increasing to level 8 after 1 minute.  Pt's O2 sats 94% after completion of last set with HR increasing into the upper 90s.  Pt returned to room at end of session with nursing present and call button in reach.  Safety belt also in place.  Therapy Documentation Precautions:  Precautions Precautions: Fall Restrictions Weight Bearing Restrictions: Yes LLE Weight Bearing: Partial weight bearing LLE Partial Weight Bearing Percentage or Pounds: 25%  Pain: Pain Assessment Pain Assessment: Faces Faces Pain Scale: Hurts little more Pain Type: Acute pain Pain Location: Leg Pain Orientation: Left Pain Descriptors / Indicators: Discomfort Pain Onset: With Activity Pain Intervention(s):  Repositioned Multiple Pain Sites: No ADL: See Function Navigator for Current Functional Status.   Therapy/Group: Individual Therapy  Arasely Akkerman OTR/L 04/09/2017, 3:48 PM

## 2017-04-09 NOTE — Progress Notes (Signed)
[facility]IC[facility] RE[facility]ABILITATION     PROGRESS NOTE  Subjective/Complaints:  Pt seen sitting up in bed this AM.  He slept well overnight. He denies complaints.   ROS: Denies CP, SOB, N/V/D.  Objective: Vital Signs: Blood pressure (!) 123/55, pulse 86, temperature 98 F (36.7 C), temperature source Oral, resp. rate 18, height 5\' 8"  (1.727 m), weight 67.4 kg (148 lb 11.2 oz), SpO2 96 %. No results found.  Recent Labs  04/08/17 0512  WBC 8.4  HGB 9.4*  HCT 28.4*  PLT 314    Recent Labs  04/08/17 0512  NA 135  K 4.0  CL 102  GLUCOSE 99  BUN 25*  CREATININE 1.07  CALCIUM 8.5*   CBG (last 3)  No results for input(s): GLUCAP in the last 72 hours.  Wt Readings from Last 3 Encounters:  04/09/17 67.4 kg (148 lb 11.2 oz)  04/01/17 67.1 kg (148 lb)  01/29/17 67.1 kg (148 lb)    Physical Exam:  BP (!) 123/55 (BP Location: Right Arm)   Pulse 86   Temp 98 F (36.7 C) (Oral)   Resp 18   Ht 5\' 8"  (1.727 m)   Wt 67.4 kg (148 lb 11.2 oz)   SpO2 96%   BMI 22.61 kg/m  Constitutional: He appears well-developed. NAD. HENT: Normocephalic. Atraumatic Eyes: EOMare normal. No discharge. Cardiovascular: RRR. No JVD. Respiratory: Effort normal and breath sounds normal.  GI: Soft. Bowel sounds are normal.  Musculoskeletal:  LLE edema.  Neurological: He is alertand oriented 5/5 B/l UE motor.  RLE 4/5 proximally, 5/5 distally.  LLE: HF 2/5,  KE 2+/5, ADF/PF 4+/5 (unchanged) Psychiatric: He has a normal mood and affect. His behavior is normal.  Skin. Surgical site with sutures c/d/i   Assessment/Plan: 1. Functional deficits secondary to Left intertrochanteric hip fracture which require 3+ hours per day of interdisciplinary therapy in a comprehensive inpatient rehab setting. Physiatrist is providing close team supervision and 24 hour management of active medical problems listed below. Physiatrist and rehab team continue to assess barriers to discharge/monitor  patient progress toward functional and medical goals.  Function:  Bathing Bathing position   Position: Shower  Bathing parts Body parts bathed by patient: Right arm, Left arm, Chest, Abdomen, Front perineal area, Right upper leg, Left upper leg, Right lower leg Body parts bathed by helper: Back, Buttocks, Left lower leg  Bathing assist        Upper Body Dressing/Undressing Upper body dressing   What is the patient wearing?: Pull over shirt/dress     Pull over shirt/dress - Perfomed by patient: Put head through opening, Thread/unthread left sleeve, Pull shirt over trunk, Thread/unthread right sleeve          Upper body assist Assist Level: Set up      Lower Body Dressing/Undressing Lower body dressing   What is the patient wearing?: Non-skid slipper socks, Ted Hose, Pants Underwear - Performed by patient: Thread/unthread right underwear leg, Thread/unthread left underwear leg Underwear - Performed by helper: Pull underwear up/down Pants- Performed by patient: Thread/unthread right pants leg, Thread/unthread left pants leg Pants- Performed by helper: Pull pants up/down Non-skid slipper socks- Performed by patient: Don/doff right sock, Don/doff left sock Non-skid slipper socks- Performed by helper: Don/doff right sock               TED Hose - Performed by helper: Don/doff right TED hose, Don/doff left TED hose  Lower body assist Assist for lower body dressing: Touching  or steadying assistance (Pt > 75%)      Toileting Toileting Toileting activity did not occur: No continent bowel/bladder event Toileting steps completed by patient: Performs perineal hygiene Toileting steps completed by helper: Adjust clothing prior to toileting, Performs perineal hygiene, Adjust clothing after toileting Toileting Assistive Devices: Other (comment) (walker)  Toileting assist Assist level: Two helpers   Transfers Chair/bed transfer   Chair/bed transfer method: Ambulatory Chair/bed  transfer assist level: Moderate assist (Pt 50 - 74%/lift or lower) Chair/bed transfer assistive device: Armrests, Medical sales representative     Max distance: 25 ft Assist level: Moderate assist (Pt 50 - 74%)   Wheelchair   Type: Manual Max wheelchair distance: 100 ft Assist Level: Touching or steadying assistance (Pt > 75%)  Cognition Comprehension Comprehension assist level: Follows complex conversation/direction with no assist  Expression Expression assist level: Expresses complex ideas: With no assist  Social Interaction Social Interaction assist level: Interacts appropriately with others - No medications needed.  Problem Solving Problem solving assist level: Solves basic 90% of the time/requires cueing < 10% of the time  Memory Memory assist level: Recognizes or recalls 75 - 89% of the time/requires cueing 10 - 24% of the time    Medical Problem List and Plan: 1. Decreased functional mobilitysecondary to left intertrochanteric hip fracture status post IM nailing on 03/30/17.   Partial weight bearing 25 pounds  Cont CIR 2. DVT Prophylaxis/Anticoagulation: Xarelto.  Vascular study neg for DVT  3. Pain Management: Hydrocodone and Robaxin as needed 4. Mood: Provide emotional support 5. Neuropsych: This patient iscapable of making decisions on hisown behalf. 6. Skin/Wound Care: Routine skin checks 7. Fluids/Electrolytes/Nutrition: Routine I&Os  BMP within acceptable range on 6/12, slightly elevated BUN  Encourage fluids 8.Acute blood loss anemia.   Hb 9.4 on 6/12 9.Hypertension.   Toprol-XL 50 mg daily.   Patient also on lisinopril 5 mg daily PTA, will resume as needed  Monitor with increased mobility  Overall controlled 6/13 10.Hyperlipidemia. Pravachol 11. Hypoalbuminemia  Supplement initiated 6/6 12. Bowel incontinence  After several days of constipation  Cont to monitor, bowel meds adjusted, continues to empty  Improved 13. Nocturnal enuresis  Timed  voiding prior to sleep  Improved  LOS (Days) 8 A FACE TO FACE EVALUATION WAS PERFORMED  Stepen Prins Lorie Phenix 04/09/2017 9:07 AM

## 2017-04-09 NOTE — Progress Notes (Signed)
Physical Therapy Weekly Progress Note  Patient Details  Name: [name] MRN: [id number] Date of Birth: 28-Aug-1932  Beginning of progress report period: April 02, 2017 End of progress report period: April 09, 2017  Today's Date: 04/09/2017 PT Individual Time: 0800-0900 PT Individual Time Calculation (min): 60 min   Patient has met 2 of 4 short term goals.  Pt is making gradual progress with mobility but gains have been slow. At this time the pt is requiring moderate assist with bed mobility and transfers. Ambulation has been performed with a max of 25 ft and mod assist. Pt is having difficulty with advancing his LLE consistently as well as tolerating hip ROM. Anticipating that the pt may require SNF for further rehabilitation following D/C due to need for 24 hour supervision. PT to continue to work to maximize mobility and safety.   Patient continues to demonstrate the following deficits muscle weakness and muscle joint tightness and decreased standing balance, decreased balance strategies and difficulty maintaining precautions and therefore will continue to benefit from skilled PT intervention to increase functional independence with mobility.  Patient progressing toward long term goals..  Continue plan of care.  PT Short Term Goals Week 2:  PT Short Term Goal 1 (Week 2): Pt to perform supine>sitting with min assist.  PT Short Term Goal 2 (Week 2): Pt to ambulate 50 ft with rw and assistance as needed.  PT Short Term Goal 3 (Week 2): Pt to perform sit<>stand with min assist. PT Short Term Goal 4 (Week 2): Pt to be able to independently advance LLE during gait for 30 ft or more.  Skilled Therapeutic Interventions/Progress Updates:  Ambulation/gait training;Balance/vestibular training;Community reintegration;Discharge planning;DME/adaptive equipment instruction;Functional electrical stimulation;Functional mobility training;Neuromuscular re-education;Pain management;Patient/family  education;Psychosocial support;Stair training;Therapeutic Activities;Therapeutic Exercise;UE/LE Strength taining/ROM;Wheelchair propulsion/positioning.   Pt in bed upon arrival, agreeable to PT session. Standing EOB for with total assist for pulling pants/brief up. Bed Mobility: mod assist supine<>sitting with assist at trunk and LEs. Transfers: sit<>stand mod assist, reminder for LLE placement forward. Performed from EOB, w/c, mat table. Ambulation: 25 ft X2 with rw, min assist for balance and intermittent assist with advancing LLE. Exercise: LLE; quad sets, heel slides, hip abd/add, SAQ, ankle pumps, seated LAQ (each 1X10). Lt hip flexion 50 degrees, limited by pain. W/c mobility supervision with occasional min assist. Following session, pt returned to room sitting in w/c with all needs in reach.   Therapy Documentation Precautions:  Precautions Precautions: Fall Restrictions Weight Bearing Restrictions: Yes LLE Weight Bearing: Partial weight bearing LLE Partial Weight Bearing Percentage or Pounds: 25%   Pain:  6/10 with activity in LLE, nursing giving pain med during session.    See Function Navigator for Current Functional Status.  Therapy/Group: Individual Therapy  Linard Millers, PT 04/09/2017, 7:47 AM

## 2017-04-10 ENCOUNTER — Inpatient Hospital Stay (HOSPITAL_COMMUNITY): Payer: Medicare Other | Admitting: Occupational Therapy

## 2017-04-10 ENCOUNTER — Inpatient Hospital Stay (HOSPITAL_COMMUNITY): Payer: Medicare Other | Admitting: Physical Therapy

## 2017-04-10 NOTE — Progress Notes (Signed)
Occupational Therapy Session Note  Patient Details  Name: [name] MRN: [id number] Date of Birth: [birth year]  Today's Date: 04/10/2017 OT Individual Time: 1310-1345 OT Individual Time Calculation (min): 35 min    Short Term Goals: Week 2:  OT Short Term Goal 1 (Week 2): Pt will complete LB bathing with min assist sit to stand using AE. OT Short Term Goal 2 (Week 2): Pt will maintain standing balance with min assist while managing clothing during toileting.   OT Short Term Goal 3 (Week 2): Pt will complete LB dressing sit to stand with min assist using AE PRN. OT Short Term Goal 4 (Week 2): Pt will complete shower transfers with min assist during ADL in room.  Skilled Therapeutic Interventions/Progress Updates:    Pt completed wheelchair mobility down to the ADL apartment with supervision to begin session.  Had him complete sit to stand and standing balance at the kitchen counter, with use of the RW for support, in order to place items in the cabinet.  Mod assist needed for sit to stand to complete this task.  He was then able to complete functional mobility to the recliner chair in the apartment with min assist and mod instructional cueing to not take as large a step on the left side, as he would at times step his foot past the front of the walker.  Completed recliner to bed transfer with mod assist, including mod assist for lifting the LLE into the bed.  Min assist for transitioning to sitting and completing sit to stand from elevated bed.  He then transferred back to the wheelchair and returned to room to complete session.  Pt left up in chair with safety belt in place and call button in reach.    Therapy Documentation Precautions:  Precautions Precautions: Fall Restrictions Weight Bearing Restrictions: Yes LLE Weight Bearing: Partial weight bearing LLE Partial Weight Bearing Percentage or Pounds: 25%  Pain: Pain Assessment Pain Assessment: Faces Faces Pain Scale: Hurts a little  bit Pain Type: Surgical pain Pain Location: Leg Pain Orientation: Left Pain Descriptors / Indicators: Discomfort Pain Onset: With Activity Pain Intervention(s): Repositioned ADL: See Function Navigator for Current Functional Status.   Therapy/Group: Individual Therapy  Jeyda Siebel OTR/L 04/10/2017, 4:02 PM

## 2017-04-10 NOTE — Progress Notes (Signed)
Physical Therapy Session Note  Patient Details  Name: [name] MRN: [id number] Date of Birth: Jan 09, 1932  Today's Date: 04/10/2017 PT Individual Time: 4709-2957 PT Individual Time Calculation (min): 75 min   Short Term Goals: Week 2:  PT Short Term Goal 1 (Week 2): Pt to perform supine>sitting with min assist.  PT Short Term Goal 2 (Week 2): Pt to ambulate 50 ft with rw and assistance as needed.  PT Short Term Goal 3 (Week 2): Pt to perform sit<>stand with min assist. PT Short Term Goal 4 (Week 2): Pt to be able to independently advance LLE during gait for 30 ft or more.  Skilled Therapeutic Interventions/Progress Updates:    Pt sitting in w/c upon arrival, agreeable to PT session.  Propelling w/c to gym with supervision, cues to avoid drifting Rt. Ambulation: 75 ft X1, 12 ft X1, using rw and min assist. Bed mobility: sit>supine with supervision-min assist with fatigue using leg lifter. Supine>sitting with min assistX1, supervision X1 with leg lifter. Transfers: sit<>stand with cues for sequence min assist-supervision. Exercise: muscle relaxation techniques into knee/hip extension to neutral, quad sets,  Heel slides, SAQ, hip abd/add (each 1X10). Sit<>stand X5 with supervision, cues for technique provided as needed. Pt propelling w/c back to room with supervision. Pt in w/c with quick release belt on and all needs in reach.   Therapy Documentation Precautions:  Precautions Precautions: Fall Restrictions Weight Bearing Restrictions: Yes LLE Weight Bearing: Partial weight bearing LLE Partial Weight Bearing Percentage or Pounds: 25% Pain: Reports minimal pain at rest, 3-4/10 during activity.   See Function Navigator for Current Functional Status.   Therapy/Group: Individual Therapy  Linard Millers 04/10/2017, 2:40 PM

## 2017-04-10 NOTE — Progress Notes (Signed)
Occupational Therapy Session Note  Patient Details  Name: [name] MRN: [id number] Date of Birth: 03/14/1932  Today's Date: 04/10/2017 OT Individual Time: 5916-3846 OT Individual Time Calculation (min): 72 min    Short Term Goals: Week 2:  OT Short Term Goal 1 (Week 2): Pt will complete LB bathing with min assist sit to stand using AE. OT Short Term Goal 2 (Week 2): Pt will maintain standing balance with min assist while managing clothing during toileting.   OT Short Term Goal 3 (Week 2): Pt will complete LB dressing sit to stand with min assist using AE PRN. OT Short Term Goal 4 (Week 2): Pt will complete shower transfers with min assist during ADL in room.  Skilled Therapeutic Interventions/Progress Updates:    Pt completed transfer from supine to sit EOB with mod assist to begin session.  Mod assist for all sit to stand transitions during transfer to the wheelchair from the EOB as well as when completing LB selfcare.  He was able to complete grooming tasks of washing his face, brushing his hair, and brushing his teeth, all with supervision.  Utilized reacher for removing gripper socks but he was able to wash his feet bilaterally and donn lace up shoes, after therapist assisted with TEDs.  Pt continues to need mod demonstrational cueing to sequence all steps for sit to stand and functional transfers.  Pt completed session in wheelchair with call button and phone in reach and safety belt in place.    Therapy Documentation Precautions:  Precautions Precautions: Fall Restrictions Weight Bearing Restrictions: Yes LLE Weight Bearing: Partial weight bearing LLE Partial Weight Bearing Percentage or Pounds: 25%  Pain: Pain Assessment Pain Assessment: No/denies pain Pain Score: 2  Faces Pain Scale: Hurts a little bit Pain Type: Surgical pain Pain Location: Leg Pain Orientation: Left Pain Descriptors / Indicators: Aching Pain Onset: With Activity Patients Stated Pain Goal: 0 Pain  Intervention(s): Medication (See eMAR) Multiple Pain Sites: No PAINAD (Pain Assessment in Advanced Dementia) Breathing: normal Body Language: relaxed Critical Care Pain Observation Tool (CPOT) Facial Expression: Relaxed, neutral Body Movements: Absence of movements Muscle Tension: Relaxed ADL: See Function Navigator for Current Functional Status.   Therapy/Group: Individual Therapy  [name] OTR/L 04/10/2017, 12:28 PM

## 2017-04-10 NOTE — Progress Notes (Signed)
[facility] PHYSICAL MEDICINE & REHABILITATION     PROGRESS NOTE  Subjective/Complaints:  Pt seen laying in bed this AM.  He states he "slept like a log"  ROS: Denies CP, SOB, N/V/D.  Objective: Vital Signs: Blood pressure 115/67, pulse 62, temperature 97.7 F (36.5 C), temperature source Oral, resp. rate 16, height 5\' 8"  (1.727 m), weight 67.4 kg (148 lb 11.2 oz), SpO2 98 %. No results found.  Recent Labs  04/08/17 0512  WBC 8.4  HGB 9.4*  HCT 28.4*  PLT 314    Recent Labs  04/08/17 0512  NA 135  K 4.0  CL 102  GLUCOSE 99  BUN 25*  CREATININE 1.07  CALCIUM 8.5*   CBG (last 3)  No results for input(s): GLUCAP in the last 72 hours.  Wt Readings from Last 3 Encounters:  04/09/17 67.4 kg (148 lb 11.2 oz)  04/01/17 67.1 kg (148 lb)  01/29/17 67.1 kg (148 lb)    Physical Exam:  BP 115/67 (BP Location: Right Arm)   Pulse 62   Temp 97.7 F (36.5 C) (Oral)   Resp 16   Ht 5\' 8"  (1.727 m)   Wt 67.4 kg (148 lb 11.2 oz)   SpO2 98%   BMI 22.61 kg/m  Constitutional: He appears well-developed. NAD. HENT: Normocephalic. Atraumatic Eyes: EOMare normal. No discharge. Cardiovascular: RRR. No JVD. Respiratory: Effort normal and breath sounds normal.  GI: Soft. Bowel sounds are normal.  Musculoskeletal:  LLE edema.  Neurological: He is alertand oriented 5/5 B/l UE motor.  RLE 4/5 proximally, 5/5 distally.  LLE: HF 2/5,  KE 2+/5, ADF/PF 4+/5 (stable) Psychiatric: He has a normal mood and affect. His behavior is normal.  Skin. Surgical site with sutures c/d/i   Assessment/Plan: 1. Functional deficits secondary to Left intertrochanteric hip fracture which require 3+ hours per day of interdisciplinary therapy in a comprehensive inpatient rehab setting. Physiatrist is providing close team supervision and 24 hour management of active medical problems listed below. Physiatrist and rehab team continue to assess barriers to discharge/monitor patient progress toward  functional and medical goals.  Function:  Bathing Bathing position   Position: Shower  Bathing parts Body parts bathed by patient: Right arm, Left arm, Chest, Abdomen, Front perineal area, Right upper leg, Left upper leg, Right lower leg Body parts bathed by helper: Back, Buttocks, Left lower leg  Bathing assist        Upper Body Dressing/Undressing Upper body dressing   What is the patient wearing?: Pull over shirt/dress     Pull over shirt/dress - Perfomed by patient: Put head through opening, Thread/unthread left sleeve, Pull shirt over trunk, Thread/unthread right sleeve          Upper body assist Assist Level: Set up      Lower Body Dressing/Undressing Lower body dressing   What is the patient wearing?: Non-skid slipper socks, Ted Hose, Pants Underwear - Performed by patient: Thread/unthread right underwear leg, Thread/unthread left underwear leg Underwear - Performed by helper: Pull underwear up/down Pants- Performed by patient: Thread/unthread right pants leg, Thread/unthread left pants leg Pants- Performed by helper: Pull pants up/down Non-skid slipper socks- Performed by patient: Don/doff right sock, Don/doff left sock Non-skid slipper socks- Performed by helper: Don/doff right sock               TED Hose - Performed by helper: Don/doff right TED hose, Don/doff left TED hose  Lower body assist Assist for lower body dressing: Touching or steadying assistance (Pt >  75%)      Toileting Toileting Toileting activity did not occur: No continent bowel/bladder event Toileting steps completed by patient: Performs perineal hygiene Toileting steps completed by helper: Adjust clothing prior to toileting, Performs perineal hygiene, Adjust clothing after toileting Toileting Assistive Devices: Other (comment) (walker)  Toileting assist Assist level: Two helpers   Transfers Chair/bed transfer   Chair/bed transfer method: Ambulatory Chair/bed transfer assist level:  Moderate assist (Pt 50 - 74%/lift or lower) Chair/bed transfer assistive device: Armrests, Medical sales representative     Max distance: 25 ft Assist level: Moderate assist (Pt 50 - 74%)   Wheelchair   Type: Manual Max wheelchair distance: 120 ft Assist Level: Supervision or verbal cues  Cognition Comprehension Comprehension assist level: Follows complex conversation/direction with no assist  Expression Expression assist level: Expresses complex ideas: With no assist  Social Interaction Social Interaction assist level: Interacts appropriately with others - No medications needed.  Problem Solving Problem solving assist level: Solves basic 90% of the time/requires cueing < 10% of the time  Memory Memory assist level: Recognizes or recalls 75 - 89% of the time/requires cueing 10 - 24% of the time    Medical Problem List and Plan: 1. Decreased functional mobilitysecondary to left intertrochanteric hip fracture status post IM nailing on 03/30/17.   Partial weight bearing 25 pounds  Cont CIR, discussing caregiver support with family to determine discharge disposition.  2. DVT Prophylaxis/Anticoagulation: Xarelto.  Vascular study neg for DVT  3. Pain Management: Hydrocodone and Robaxin as needed 4. Mood: Provide emotional support 5. Neuropsych: This patient iscapable of making decisions on hisown behalf. 6. Skin/Wound Care: Routine skin checks 7. Fluids/Electrolytes/Nutrition: Routine I&Os  BMP within acceptable range on 6/12, slightly elevated BUN  Encourage fluids 8.Acute blood loss anemia.   Hb 9.4 on 6/12 9.Hypertension.   Toprol-XL 50 mg daily.   Patient also on lisinopril 5 mg daily PTA, will resume as needed  Monitor with increased mobility  Overall controlled 6/14 10.Hyperlipidemia. Pravachol 11. Hypoalbuminemia  Supplement initiated 6/6 12. Bowel incontinence  After several days of constipation  Cont to monitor, bowel meds adjusted, continues to  empty  Improved 13. Nocturnal enuresis  Timed voiding prior to sleep  Improved  LOS (Days) 9 A FACE TO FACE EVALUATION WAS PERFORMED  Ankit Lorie Phenix 04/10/2017 8:10 AM

## 2017-04-11 ENCOUNTER — Inpatient Hospital Stay (HOSPITAL_COMMUNITY): Payer: Medicare Other | Admitting: Occupational Therapy

## 2017-04-11 ENCOUNTER — Inpatient Hospital Stay (HOSPITAL_COMMUNITY): Payer: Medicare Other | Admitting: Physical Therapy

## 2017-04-11 NOTE — Progress Notes (Signed)
Social Work Patient ID: [name], male   DOB: 05-18-1932, 83 y.o.   MRN: [id number]   CSW met with pt 04-10-17 to update him on team conference discussion.  Pt gave CSW permission to talk with his dtr.  CSW left a message for dtr and await return call back.  CSW did hear from a local SNF who is looking into taking pt to their facility.  Will follow up with family and SNF next week.

## 2017-04-11 NOTE — Progress Notes (Signed)
Physical Therapy Session Note  Patient Details  Name: [name] MRN: [id number] Date of Birth: 1932/07/14  Today's Date: 04/11/2017 PT Individual Time: 0900-1000 and 1415-1521 PT Individual Time Calculation (min): 60 min and 66 min  Short Term Goals: Week 2:  PT Short Term Goal 1 (Week 2): Pt to perform supine>sitting with min assist.  PT Short Term Goal 2 (Week 2): Pt to ambulate 50 ft with rw and assistance as needed.  PT Short Term Goal 3 (Week 2): Pt to perform sit<>stand with min assist. PT Short Term Goal 4 (Week 2): Pt to be able to independently advance LLE during gait for 30 ft or more.  Skilled Therapeutic Interventions/Progress Updates: Tx1: Pt presented in bed agreeable to therapy. Donned pants with minA and use of reacher. Performed supine to sit with minA and cues for sequencing and hand placement. PTA donned shoes for time management. Pt propelled to rehab gym with supervision and min cues for drifting to R. Stand pivot transfer w/c to mat with minA pt able to verbalize sequencing prior to action. Performed supine therex including heel slides, QS, SAQ, hip abd/add x 10. Trialed AASLR from elevated position x 5. Supine to sit minA with additional time required. Pt ambulated 23f with RW, cues for bringing RLE equal to LLE.  Increased fatigue noted with LLE. Pt propelled remaining distance back to room and remained in w/c at end of session with call bell within reach and needs met.   Tx2: Pt presented in w/c asleep but easily aroused and agreeable to therapy. Pt propelled to rehab gym and performed stand pivot to NuStep. Performed NuStep L1 x 5 min on self selected speed. Pt returned to room after NuStep to use urinal. Performed static standing placing horseshoes on basketball hoop total standing time approx 5 min. Performed sit to/from stand with 1in step placed on LLE x5 for strengthening. Pt performed all sit to stands with min guard. Pt with increasing fatigue throughout session  requiring frequent and lengthening rest breaks. Pt ambulated approx [id number] with significant fatigue and increased difficulty clearing/advancing LLE. Pt returned to w/c and PTA transported back to room. Performed stand pivot to bed and pt required minA sit to supine with use of leg lifter due to fatigue. Pt repositioned in bed with minA and left in bed with alarm on and needs met.      Therapy Documentation Precautions:  Precautions Precautions: Fall Restrictions Weight Bearing Restrictions: Yes LLE Weight Bearing: Partial weight bearing LLE Partial Weight Bearing Percentage or Pounds: 25%   See Function Navigator for Current Functional Status.   Therapy/Group: Individual Therapy  Tadarrius Burch  Yolunda Kloos, PTA  04/11/2017, 12:20 PM

## 2017-04-11 NOTE — Progress Notes (Signed)
Occupational Therapy Session Note  Patient Details  Name: [name] MRN: [id number] Date of Birth: 04/09/1932  Today's Date: 04/11/2017 OT Individual Time: 1101-1157 OT Individual Time Calculation (min): 56 min    Short Term Goals: Week 2:  OT Short Term Goal 1 (Week 2): Pt will complete LB bathing with min assist sit to stand using AE. OT Short Term Goal 2 (Week 2): Pt will maintain standing balance with min assist while managing clothing during toileting.   OT Short Term Goal 3 (Week 2): Pt will complete LB dressing sit to stand with min assist using AE PRN. OT Short Term Goal 4 (Week 2): Pt will complete shower transfers with min assist during ADL in room.  Skilled Therapeutic Interventions/Progress Updates:    Pt completed toilet transfers and toileting during session with min assist.  He needed mod instructional cueing for technique to keep weight off of the LLE during sit to stand and stand to sit transitions.  Pt forgets to place the LLE out in front during these transitions.  He then completed grooming tasks at the wheelchair with setup for washing face, brushing teeth, and shaving.  Second part of session focused on UE exercises as listed below with mod demonstrational cueing for technique.  Pt left in wheelchair with call button and phone in reach.  Safety belt also in place.  Therapy Documentation Precautions:  Precautions Precautions: Fall Restrictions Weight Bearing Restrictions: Yes LLE Weight Bearing: Partial weight bearing LLE Partial Weight Bearing Percentage or Pounds: 25%   Pain 3/10 on the faces scale with transitional movements sit to stand and standing balance.  Exercises: General Exercises - Upper Extremity Shoulder Flexion: Strengthening;Both;10 reps;Seated;Theraband Theraband Level (Shoulder Flexion): Level 2 (Red) Shoulder Extension: Strengthening;Both;20 reps;Seated;Theraband Theraband Level (Shoulder Extension): Level 2 (Red) Shoulder Horizontal  ABduction: Strengthening;Both;10 reps;Seated;Theraband Theraband Level (Shoulder Horizontal Abduction): Level 2 (Red) Elbow Flexion: Strengthening;Both;20 reps;Theraband;Seated Theraband Level (Elbow Flexion): Level 2 (Red) Other Treatments:    See Function Navigator for Current Functional Status.   Therapy/Group: Individual Therapy  Elaysha Bevard OTR/L 04/11/2017, 3:54 PM

## 2017-04-11 NOTE — Progress Notes (Signed)
[place] PHYSICAL MEDICINE & REHABILITATION     PROGRESS NOTE  Subjective/Complaints:  Seen in PT, no c/os.  States he has more Left knee and ankle  pain than hip pain  ROS: Denies CP, SOB, N/V/D.  Objective: Vital Signs: Blood pressure 118/68, pulse 80, temperature 98.9 F (37.2 C), temperature source Oral, resp. rate 16, height 5\' 8"  (1.727 m), weight 67.4 kg (148 lb 11.2 oz), SpO2 100 %. No results found. No results for input(s): WBC, HGB, HCT, PLT in the last 72 hours. No results for input(s): NA, K, CL, GLUCOSE, BUN, CREATININE, CALCIUM in the last 72 hours.  Invalid input(s): CO CBG (last 3)  No results for input(s): GLUCAP in the last 72 hours.  Wt Readings from Last 3 Encounters:  04/09/17 67.4 kg (148 lb 11.2 oz)  04/01/17 67.1 kg (148 lb)  01/29/17 67.1 kg (148 lb)    Physical Exam:  BP 118/68   Pulse 80   Temp 98.9 F (37.2 C) (Oral)   Resp 16   Ht 5\' 8"  (1.727 m)   Wt 67.4 kg (148 lb 11.2 oz)   SpO2 100%   BMI 22.61 kg/m  Constitutional: He appears well-developed. NAD. HENT: Normocephalic. Atraumatic Eyes: EOMare normal. No discharge. Cardiovascular: RRR. No JVD. Respiratory: Effort normal and breath sounds normal.  GI: Soft. Bowel sounds are normal.  Musculoskeletal:  LLE edema. No pain with Left ankle ROM, no Left knee effusion or tenderness  Neurological: He is alertand oriented 5/5 B/l UE motor.  RLE 4/5 proximally, 5/5 distally.  LLE: HF 2/5,  KE 2+/5, ADF/PF 4+/5 (stable) Psychiatric: He has a normal mood and affect. His behavior is normal.  Skin. Surgical site with sutures c/d/i   Assessment/Plan: 1. Functional deficits secondary to Left intertrochanteric hip fracture which require 3+ hours per day of interdisciplinary therapy in a comprehensive inpatient rehab setting. Physiatrist is providing close team supervision and 24 hour management of active medical problems listed below. Physiatrist and rehab team continue to assess barriers to  discharge/monitor patient progress toward functional and medical goals.  Function:  Bathing Bathing position   Position: Shower  Bathing parts Body parts bathed by patient: Right arm, Left arm, Chest, Abdomen, Front perineal area, Right upper leg, Left upper leg, Right lower leg, Left lower leg Body parts bathed by helper: Buttocks, Back  Bathing assist        Upper Body Dressing/Undressing Upper body dressing   What is the patient wearing?: Pull over shirt/dress     Pull over shirt/dress - Perfomed by patient: Put head through opening, Thread/unthread left sleeve, Pull shirt over trunk, Thread/unthread right sleeve          Upper body assist Assist Level: Supervision or verbal cues      Lower Body Dressing/Undressing Lower body dressing   What is the patient wearing?: Ted Hose, Pants, Shoes Underwear - Performed by patient: Thread/unthread right underwear leg, Thread/unthread left underwear leg Underwear - Performed by helper: Pull underwear up/down Pants- Performed by patient: Thread/unthread right pants leg, Thread/unthread left pants leg Pants- Performed by helper: Pull pants up/down Non-skid slipper socks- Performed by patient: Don/doff right sock Non-skid slipper socks- Performed by helper: Don/doff left sock     Shoes - Performed by patient: Don/doff right shoe, Don/doff left shoe, Fasten right, Fasten left         TED Hose - Performed by helper: Don/doff right TED hose, Don/doff left TED hose  Lower body assist Assist for lower body  dressing: Touching or steadying assistance (Pt > 75%)      Toileting Toileting Toileting activity did not occur: No continent bowel/bladder event Toileting steps completed by patient: Adjust clothing prior to toileting, Adjust clothing after toileting Toileting steps completed by helper: Adjust clothing prior to toileting, Performs perineal hygiene, Adjust clothing after toileting Toileting Assistive Devices: Other (comment)  (walker)  Toileting assist Assist level: Two helpers   Transfers Chair/bed transfer   Chair/bed transfer method: Ambulatory Chair/bed transfer assist level: Touching or steadying assistance (Pt > 75%) Chair/bed transfer assistive device: Armrests, Medical sales representative     Max distance: 75 Assist level: Touching or steadying assistance (Pt > 75%)   Wheelchair   Type: Manual Max wheelchair distance: 150 ft Assist Level: Supervision or verbal cues  Cognition Comprehension Comprehension assist level: Follows complex conversation/direction with no assist  Expression Expression assist level: Expresses complex ideas: With no assist  Social Interaction Social Interaction assist level: Interacts appropriately with others - No medications needed.  Problem Solving Problem solving assist level: Solves basic 90% of the time/requires cueing < 10% of the time  Memory Memory assist level: Recognizes or recalls 75 - 89% of the time/requires cueing 10 - 24% of the time    Medical Problem List and Plan: 1. Decreased functional mobilitysecondary to left intertrochanteric hip fracture status post IM nailing on 03/30/17.   Partial weight bearing 25 pounds  CIR PT, OT 2. DVT Prophylaxis/Anticoagulation: Xarelto.  Vascular study neg for DVT  3. Pain Management: Hydrocodone and Robaxin as needed 4. Mood: Provide emotional support 5. Neuropsych: This patient iscapable of making decisions on hisown behalf. 6. Skin/Wound Care: Routine skin checks 7. Fluids/Electrolytes/Nutrition: Routine I&Os  BMP within acceptable range on 6/12, slightly elevated BUN  Encourage fluids 8.Acute blood loss anemia.   Hb 9.4 on 6/12 9.Hypertension.   Toprol-XL 50 mg daily.   Patient also on lisinopril 5 mg daily PTA, will resume as needed  Monitor with increased mobility  Overall controlled 6/15 Vitals:   04/11/17 0504 04/11/17 0750  BP: (!) 103/57 118/68  Pulse: 81 80  Resp: 16   Temp: 98.9 F  (37.2 C)    10.Hyperlipidemia. Pravachol 11. Hypoalbuminemia  Supplement initiated 6/6 12. Bowel incontinence  After several days of constipation  Cont to monitor, bowel meds adjusted, continues to empty  Improved 13. Nocturnal enuresis  Timed voiding prior to sleep  Improved  LOS (Days) 10 A FACE TO FACE EVALUATION WAS PERFORMED  Charlett Blake 04/11/2017 9:34 AM

## 2017-04-12 ENCOUNTER — Inpatient Hospital Stay (HOSPITAL_COMMUNITY): Payer: Medicare Other

## 2017-04-12 NOTE — Progress Notes (Signed)
[name] is a 83 y.o. male 1932-03-09 [id number]  Subjective: No new complaints. No new problems. Slept well. Feeling OK. C/o constipation  Objective: Vital signs in last 24 hours: Temp:  [97.8 F (36.6 C)-98 F (36.7 C)] 97.8 F (36.6 C) (06/16 0423) Pulse Rate:  [51-80] 80 (06/16 0905) Resp:  [16-18] 16 (06/16 0423) BP: (101-115)/(46-47) 115/46 (06/16 0905) SpO2:  [97 %-100 %] 97 % (06/16 0423) Weight change:  Last BM Date: 04/09/17  Intake/Output from previous day: 06/15 0701 - 06/16 0700 In: 840 [P.O.:840] Out: 1100 [Urine:1100] Last cbgs: CBG (last 3)  No results for input(s): GLUCAP in the last 72 hours.   Physical Exam General: No apparent distress   HEENT: not dry Lungs: Normal effort. Lungs clear to auscultation, no crackles or wheezes. Cardiovascular: Regular rate and rhythm, no edema Abdomen: S/NT/ND; BS(+) Musculoskeletal:  unchanged Neurological: No new neurological deficits Wounds: N/A    Skin: clear  Aging changes Mental state: Alert, cooperative    Lab Results: BMET    Component Value Date/Time   NA 135 04/08/2017 0512   NA 141 01/29/2017 1106   K 4.0 04/08/2017 0512   CL 102 04/08/2017 0512   CO2 27 04/08/2017 0512   GLUCOSE 99 04/08/2017 0512   BUN 25 (H) 04/08/2017 0512   BUN 16 01/29/2017 1106   CREATININE 1.07 04/08/2017 0512   CREATININE 1.18 (H) 09/25/2016 1504   CALCIUM 8.5 (L) 04/08/2017 0512   GFRNONAA >60 04/08/2017 0512   GFRNONAA 56 (L) 06/03/2014 1013   GFRAA >60 04/08/2017 0512   GFRAA 64 06/03/2014 1013   CBC    Component Value Date/Time   WBC 8.4 04/08/2017 0512   RBC 2.96 (L) 04/08/2017 0512   HGB 9.4 (L) 04/08/2017 0512   HGB 15.3 01/29/2017 1106   HCT 28.4 (L) 04/08/2017 0512   HCT 46.0 01/29/2017 1106   PLT 314 04/08/2017 0512   PLT 209 01/29/2017 1106   MCV 95.9 04/08/2017 0512   MCV 95 01/29/2017 1106   MCH 31.8 04/08/2017 0512   MCHC 33.1 04/08/2017 0512   RDW 13.3 04/08/2017 0512   RDW 13.3  01/29/2017 1106   LYMPHSABS 1.7 04/08/2017 0512   LYMPHSABS 1.5 01/29/2017 1106   MONOABS 0.6 04/08/2017 0512   EOSABS 0.8 (H) 04/08/2017 0512   EOSABS 0.9 (H) 01/29/2017 1106   BASOSABS 0.0 04/08/2017 0512   BASOSABS 0.0 01/29/2017 1106    Studies/Results: No results found.  Medications: I have reviewed the patient's current medications.  Assessment/Plan:   1. L hip fx, s/p IM nailing - CIR 2. DVT proph w/Xarelto 3. Pain control - Norco prn 4.  Anemia. Monitor CBC 5. HTN. Toprol, Lisinoproil 6. Dyslipidemia - Pravachol 7. Constipation - Sorbitol prn.             Length of stay, days: [place] , MD 04/12/2017, 1:01 PM

## 2017-04-12 NOTE — Plan of Care (Signed)
Problem: RH SKIN INTEGRITY Goal: RH STG SKIN FREE OF INFECTION/BREAKDOWN Skin free of infection/breakdown with min assist  Outcome: Progressing No skin infection or breakdown noted  Problem: RH SAFETY Goal: RH STG ADHERE TO SAFETY PRECAUTIONS W/ASSISTANCE/DEVICE STG Adhere to Safety Precautions With min Assistance/Device.   Outcome: Progressing Safety precautions maintained  Problem: RH PAIN MANAGEMENT Goal: RH STG PAIN MANAGED AT OR BELOW PT'S PAIN GOAL <3 on 0-10  Outcome: Progressing Denies pain

## 2017-04-12 NOTE — Progress Notes (Signed)
Occupational Therapy Session Note  Patient Details  Name: Marquis Down MRN: [id number] Date of Birth: Oct 28, 1932  Today's Date: 04/12/2017 OT Individual Time: 1130-1200 OT Individual Time Calculation (min): 30 min    Skilled Therapeutic Interventions/Progress Updates:    1:1. OT cued pt to use leg lifter to supine>EOB with instructional cues. Pt stand pivot transfer with MOD A EOB>w/c<>BSC using RW with VC for WB precautions and RW management. Pt stands and OT completes clothing management and hygiene without steadying A from OT, but required VC for posture. Pt sits in w/c at sink to complete oral care and shave with set up. Exited session with pt seated in w/c with call light in reach and all needs met.   Therapy Documentation Precautions:  Precautions Precautions: Fall Restrictions Weight Bearing Restrictions: Yes LLE Weight Bearing: Partial weight bearing LLE Partial Weight Bearing Percentage or Pounds: 25%  See Function Navigator for Current Functional Status.   Therapy/Group: Individual Therapy  Tonny Branch 04/12/2017, 12:20 PM

## 2017-04-12 NOTE — Plan of Care (Signed)
Problem: RH SKIN INTEGRITY Goal: RH STG SKIN FREE OF INFECTION/BREAKDOWN Skin free of infection/breakdown with min assist  Outcome: Progressing No skin infection or breakdown noted Goal: RH STG ABLE TO PERFORM INCISION/WOUND CARE W/ASSISTANCE STG Able To Perform Incision/Wound Care With min Assistance.   Outcome: Progressing Dressings clean, dry and intact to left hip incision  Problem: RH SAFETY Goal: RH STG ADHERE TO SAFETY PRECAUTIONS W/ASSISTANCE/DEVICE STG Adhere to Safety Precautions With min Assistance/Device.   Outcome: Progressing Safety precautions and fall prevention maintained   Problem: RH PAIN MANAGEMENT Goal: RH STG PAIN MANAGED AT OR BELOW PT'S PAIN GOAL <3 on 0-10  Outcome: Progressing Denies pain and discomfort

## 2017-04-13 ENCOUNTER — Inpatient Hospital Stay (HOSPITAL_COMMUNITY): Payer: Medicare Other | Admitting: Occupational Therapy

## 2017-04-13 DIAGNOSIS — S72142D Displaced intertrochanteric fracture of left femur, subsequent encounter for closed fracture with routine healing: Principal | ICD-10-CM

## 2017-04-13 NOTE — Progress Notes (Signed)
Occupational Therapy Session Note  Patient Details  Name: [name] MRN: [id number] Date of Birth: November 11, 1931  Today's Date: 04/13/2017 OT Individual Time: 3151-7616 OT Individual Time Calculation (min): 41 min    Short Term Goals: Week 2:  OT Short Term Goal 1 (Week 2): Pt will complete LB bathing with min assist sit to stand using AE. OT Short Term Goal 2 (Week 2): Pt will maintain standing balance with min assist while managing clothing during toileting.   OT Short Term Goal 3 (Week 2): Pt will complete LB dressing sit to stand with min assist using AE PRN. OT Short Term Goal 4 (Week 2): Pt will complete shower transfers with min assist during ADL in room.  Skilled Therapeutic Interventions/Progress Updates:    Tx focus on functional transfers, standing balance, awareness, and endurance during self care tasks.   Pt greeted supine in bed, agreeable to tx. Pt transitioning to EOB with leg lifter, initiating use of device himself. Pt requiring Mod A for stand pivot transfer, max cues for adherence to L LE precautions. Had him keep L LE forward for sit<stand, cued to hop on Rt foot, however pt had Lt foot on floor during transfer, and had difficultly positioning L LE forward during stand<sit. Pt required these cues during toilet transfers also. Max A for toileting. Pt then completed oral care/grooming tasks w/c level at sink. UB dressing completed with supervision and LB dressing with assist for lifting pants over hips, Teds, and footwear. Pt able to thread LEs into pants without reacher. Sit<stand with Mod A at sink. At end of tx pt was left in w/c with all needs within reach.   Pt audibly straining on toilet during BM. RN made aware.   Therapy Documentation Precautions:  Precautions Precautions: Fall Restrictions Weight Bearing Restrictions: Yes LLE Weight Bearing: Partial weight bearing LLE Partial Weight Bearing Percentage or Pounds: 25%  Pain: No c/o pain during tx    ADL:       See Function Navigator for Current Functional Status.   Therapy/Group: Individual Therapy  [name] 04/13/2017, 4:45 PM

## 2017-04-13 NOTE — Plan of Care (Signed)
Problem: RH SKIN INTEGRITY Goal: RH STG SKIN FREE OF INFECTION/BREAKDOWN Skin free of infection/breakdown with min assist  Outcome: Progressing No skin breakdown or infection noted  Problem: RH SAFETY Goal: RH STG ADHERE TO SAFETY PRECAUTIONS W/ASSISTANCE/DEVICE STG Adhere to Safety Precautions With min Assistance/Device.   Outcome: Progressing No safety issues noted  Problem: RH PAIN MANAGEMENT Goal: RH STG PAIN MANAGED AT OR BELOW PT'S PAIN GOAL <3 on 0-10  Outcome: Progressing Denies pain and discomfort

## 2017-04-13 NOTE — Progress Notes (Signed)
Patient ID: [name], male   DOB: [birth year], 83 y.o.   MRN: [id number]   04/13/17.  [name] is a 83 y.o. male admitted for CIR with decreased functional mobility secondary to left intertrochanteric hip fracture; status post IM nailing on 03/30/2017  Subjective: No new complaints. No new problems. Slept well.   Past Medical History:  Diagnosis Date  . Allergy    seasonal  fall  . Cancer ([place])    Basal cell carcinoma; multiple  . Eczema   . Hyperlipidemia   . Hypertension    BP Readings from Last 3 Encounters:  04/13/17 (!) 118/58  04/01/17 (!) 106/55  01/29/17 (!) 157/87    Objective: Vital signs in last 24 hours: Temp:  [97.4 F (36.3 C)-98.4 F (36.9 C)] 98.4 F (36.9 C) (06/17 0433) Pulse Rate:  [81-84] 81 (06/17 0433) Resp:  [17-18] 18 (06/17 0433) BP: (110-118)/(45-58) 118/58 (06/17 0433) SpO2:  [97 %-98 %] 97 % (06/17 0433) Weight change:  Last BM Date: 04/12/17  Intake/Output from previous day: 06/16 0701 - 06/17 0700 In: 720 [P.O.:720] Out: 1375 [Urine:1375] Last cbgs: CBG (last 3)  No results for input(s): GLUCAP in the last 72 hours.   Physical Exam General: No apparent distress   HEENT: not dry Lungs: Normal effort. Lungs clear to auscultation, no crackles or wheezes. Cardiovascular: Rhythm is irregular with frequent ectopics, no edema Abdomen: S/NT/ND; BS(+) Musculoskeletal:  unchanged Neurological: No new neurological deficits Wounds: Stable  Mental state: Alert, oriented, cooperative    Lab Results: BMET    Component Value Date/Time   NA 135 04/08/2017 0512   NA 141 01/29/2017 1106   K 4.0 04/08/2017 0512   CL 102 04/08/2017 0512   CO2 27 04/08/2017 0512   GLUCOSE 99 04/08/2017 0512   BUN 25 (H) 04/08/2017 0512   BUN 16 01/29/2017 1106   CREATININE 1.07 04/08/2017 0512   CREATININE 1.18 (H) 09/25/2016 1504   CALCIUM 8.5 (L) 04/08/2017 0512   GFRNONAA >60 04/08/2017 0512   GFRNONAA 56 (L) 06/03/2014 1013   GFRAA >60  04/08/2017 0512   GFRAA 64 06/03/2014 1013   CBC    Component Value Date/Time   WBC 8.4 04/08/2017 0512   RBC 2.96 (L) 04/08/2017 0512   HGB 9.4 (L) 04/08/2017 0512   HGB 15.3 01/29/2017 1106   HCT 28.4 (L) 04/08/2017 0512   HCT 46.0 01/29/2017 1106   PLT 314 04/08/2017 0512   PLT 209 01/29/2017 1106   MCV 95.9 04/08/2017 0512   MCV 95 01/29/2017 1106   MCH 31.8 04/08/2017 0512   MCHC 33.1 04/08/2017 0512   RDW 13.3 04/08/2017 0512   RDW 13.3 01/29/2017 1106   LYMPHSABS 1.7 04/08/2017 0512   LYMPHSABS 1.5 01/29/2017 1106   MONOABS 0.6 04/08/2017 0512   EOSABS 0.8 (H) 04/08/2017 0512   EOSABS 0.9 (H) 01/29/2017 1106   BASOSABS 0.0 04/08/2017 0512   BASOSABS 0.0 01/29/2017 1106    Studies/Results: No results found.  Medications: I have reviewed the patient's current medications.  Assessment/Plan:  Functional mobility secondary to left intertrochanteric hip fracture History of acute blood loss anemia Hypertension.  Well-controlled    Length of stay, days: [place] , MD 04/13/2017, 10:18 AM

## 2017-04-14 ENCOUNTER — Inpatient Hospital Stay (HOSPITAL_COMMUNITY): Payer: Medicare Other | Admitting: Physical Therapy

## 2017-04-14 ENCOUNTER — Inpatient Hospital Stay (HOSPITAL_COMMUNITY): Payer: Medicare Other | Admitting: Occupational Therapy

## 2017-04-14 DIAGNOSIS — W19XXXS Unspecified fall, sequela: Secondary | ICD-10-CM

## 2017-04-14 MED ORDER — METOPROLOL SUCCINATE ER 50 MG PO TB24: 50.0000 mg | ORAL_TABLET | Freq: Every day | ORAL | 3 refills | Status: DC

## 2017-04-14 MED ORDER — DOCUSATE SODIUM 100 MG PO CAPS
200.0000 mg | ORAL_CAPSULE | Freq: Two times a day (BID) | ORAL | Status: DC
  Administered 2017-04-14 – 2017-04-15 (×3): 200 mg via ORAL
  Filled 2017-04-14 (×3): qty 2

## 2017-04-14 MED ORDER — HYDROCODONE-ACETAMINOPHEN 5-325 MG PO TABS: 1.0000 | ORAL_TABLET | Freq: Four times a day (QID) | ORAL | 0 refills | Status: DC | PRN

## 2017-04-14 MED ORDER — PRAVASTATIN SODIUM 40 MG PO TABS: ORAL_TABLET | ORAL | 3 refills | Status: DC

## 2017-04-14 MED ORDER — RIVAROXABAN 10 MG PO TABS: 10.0000 mg | ORAL_TABLET | Freq: Every day | ORAL | 0 refills | Status: DC

## 2017-04-14 MED ORDER — METHOCARBAMOL 500 MG PO TABS: 500.0000 mg | ORAL_TABLET | Freq: Four times a day (QID) | ORAL | 0 refills | Status: DC | PRN

## 2017-04-14 NOTE — Progress Notes (Signed)
Occupational Therapy Session Note  Patient Details  Name: [name] MRN: [id number] Date of Birth: 1932-05-20  Today's Date: 04/14/2017 OT Individual Time: 4383-7793 OT Individual Time Calculation (min): 58 min    Short Term Goals: Week 2:  OT Short Term Goal 1 (Week 2): Pt will complete LB bathing with min assist sit to stand using AE. OT Short Term Goal 2 (Week 2): Pt will maintain standing balance with min assist while managing clothing during toileting.   OT Short Term Goal 3 (Week 2): Pt will complete LB dressing sit to stand with min assist using AE PRN. OT Short Term Goal 4 (Week 2): Pt will complete shower transfers with min assist during ADL in room.  Skilled Therapeutic Interventions/Progress Updates:    Pt completed transfer to the wheelchair with min assist and mod demonstrational cueing for hand placement and LLE placement to maintain PWBing status.  He was able to complete all UB selfcare in sitting with setup and LB selfcare sit to stand with min assist.  He utilized the reacher for removing the LLE secondary to being too painful to flex down to reach the foot.  He was able to thread his underpants and shorts without using the reacher however.  He was able to donn both of his shoes and tie them with increased time after therapist assisted with donning TEDs.  Pt completed several sit to stands during session with overall min assist.    Therapy Documentation Precautions:  Precautions Precautions: Fall Restrictions Weight Bearing Restrictions: Yes LLE Weight Bearing: Partial weight bearing LLE Partial Weight Bearing Percentage or Pounds: 25%  Pain: Pain Assessment Pain Assessment: Faces Faces Pain Scale: Hurts little more Pain Type: Surgical pain Pain Location: Leg Pain Orientation: Left Pain Descriptors / Indicators: Discomfort Pain Onset: With Activity Pain Intervention(s): Repositioned Multiple Pain Sites: No ADL: See Function Navigator for Current Functional  Status.   Therapy/Group: Individual Therapy  Dajon Rowe OTR/L 04/14/2017, 12:07 PM

## 2017-04-14 NOTE — Progress Notes (Signed)
Occupational Therapy Session Note  Patient Details  Name: Rajan Burgard MRN: [id number] Date of Birth: 01-09-1932  Today's Date: 04/14/2017 OT Individual Time: 1345-1430 OT Individual Time Calculation (min): 45 min    Short Term Goals: Week 1:  OT Short Term Goal 1 (Week 1): Pt will complete LB bathing with min assist sit to stand using AE. OT Short Term Goal 1 - Progress (Week 1): Not met OT Short Term Goal 2 (Week 1): Pt will complete LB dressing sit to stand with min assist using AE PRN. OT Short Term Goal 2 - Progress (Week 1): Not met OT Short Term Goal 3 (Week 1): Pt will complete shower transfers with min assist during ADL in room. OT Short Term Goal 3 - Progress (Week 1): Not met OT Short Term Goal 4 (Week 1): Pt will maintain standing balance with min assist while managing clothing during toileting.   OT Short Term Goal 4 - Progress (Week 1): Not met Week 2:  OT Short Term Goal 1 (Week 2): Pt will complete LB bathing with min assist sit to stand using AE. OT Short Term Goal 2 (Week 2): Pt will maintain standing balance with min assist while managing clothing during toileting.   OT Short Term Goal 3 (Week 2): Pt will complete LB dressing sit to stand with min assist using AE PRN. OT Short Term Goal 4 (Week 2): Pt will complete shower transfers with min assist during ADL in room.  Skilled Therapeutic Interventions/Progress Updates:    Treatment Focus:  Sit to stand, LB dressing, sitting balance, UE AROM.  Pt engaged in functional mobility activities while using the wc.  Instructed in proper technique with foot sliding and pushing from arm rest.  Pt repeated 3 times before correctly returning demonstration.  Practiced abdominal breathing for increased energy conservation and to accent sit to stand.  Ppt using lots of grunting and yelling with efforts to get up.  After instruction on good breathing strategies, hs did better    Engaged in theraband exercise with focus on upright posture, and  scapular adduction (diagnoal patterns, scapula adduction, and tricps.  Ppt demonstrated and returned instructions.   Therapy Documentation Precautions:  Precautions Precautions: Fall Restrictions Weight Bearing Restrictions: Yes LLE Weight Bearing: Partial weight bearing LLE Partial Weight Bearing Percentage or Pounds: 25%    Vital Signs: Therapy Vitals Temp: 97.6 F (36.4 C) Temp Source: Oral Pulse Rate: (!) 132 Resp: 17 BP: (!) 106/44 Patient Position (if appropriate): Sitting Oxygen Therapy SpO2: (!) 86 % O2 Device: Not Delivered Pain: Pain Assessment Pain Score: 3  ADL:   Vision Eye Alignment: Within Functional Limits Perception  Perception: Within Functional Limits          See Function Navigator for Current Functional Status.   Therapy/Group: Individual Therapy  Lisa Roca 04/14/2017, 3:49 PM

## 2017-04-14 NOTE — Progress Notes (Addendum)
[facility] PHYSICAL MEDICINE & REHABILITATION     PROGRESS NOTE  Subjective/Complaints:  Pt seen laying in bed this AM.  He slept well overnight and had a good weekend.   ROS: Denies CP, SOB, N/V/D.  Objective: Vital Signs: Blood pressure (!) 160/64, pulse 81, temperature 98 F (36.7 C), temperature source Oral, resp. rate 17, height 5\' 8"  (1.727 m), weight 67.4 kg (148 lb 11.2 oz), SpO2 96 %. No results found. No results for input(s): WBC, HGB, HCT, PLT in the last 72 hours. No results for input(s): NA, K, CL, GLUCOSE, BUN, CREATININE, CALCIUM in the last 72 hours.  Invalid input(s): CO CBG (last 3)  No results for input(s): GLUCAP in the last 72 hours.  Wt Readings from Last 3 Encounters:  04/09/17 67.4 kg (148 lb 11.2 oz)  04/01/17 67.1 kg (148 lb)  01/29/17 67.1 kg (148 lb)    Physical Exam:  BP (!) 160/64 (BP Location: Right Arm)   Pulse 81   Temp 98 F (36.7 C) (Oral)   Resp 17   Ht 5\' 8"  (1.727 m)   Wt 67.4 kg (148 lb 11.2 oz)   SpO2 96%   BMI 22.61 kg/m  Constitutional: He appears well-developed. NAD. HENT: Normocephalic. Atraumatic Eyes: EOMare normal. No discharge. Cardiovascular: RRR. No JVD. Respiratory: Effort normal and breath sounds normal.  GI: Soft. Bowel sounds are normal.  Musculoskeletal: LLE edema and tenderness Neurological: He is alertand oriented 5/5 B/l UE motor.  RLE 4/5 proximally, 5/5 distally.  LLE: HF 2/5,  KE 2+/5, ADF/PF 4+/5 (unchanged) Skin. Surgical site with sutures c/d/i Psychiatric: He has a normal mood and affect. His behavior is normal.   Assessment/Plan: 1. Functional deficits secondary to Left intertrochanteric hip fracture which require 3+ hours per day of interdisciplinary therapy in a comprehensive inpatient rehab setting. Physiatrist is providing close team supervision and 24 hour management of active medical problems listed below. Physiatrist and rehab team continue to assess barriers to discharge/monitor patient  progress toward functional and medical goals.  Function:  Bathing Bathing position   Position: Shower  Bathing parts Body parts bathed by patient: Right arm, Left arm, Chest, Abdomen, Front perineal area, Right upper leg, Left upper leg, Right lower leg, Left lower leg Body parts bathed by helper: Buttocks, Back  Bathing assist        Upper Body Dressing/Undressing Upper body dressing   What is the patient wearing?: Pull over shirt/dress     Pull over shirt/dress - Perfomed by patient: Put head through opening, Thread/unthread left sleeve, Pull shirt over trunk, Thread/unthread right sleeve          Upper body assist Assist Level: Set up      Lower Body Dressing/Undressing Lower body dressing   What is the patient wearing?: Ted Hose, Pants, Non-skid slipper socks Underwear - Performed by patient: Thread/unthread right underwear leg, Thread/unthread left underwear leg Underwear - Performed by helper: Pull underwear up/down Pants- Performed by patient: Thread/unthread right pants leg, Thread/unthread left pants leg Pants- Performed by helper: Pull pants up/down Non-skid slipper socks- Performed by patient: Don/doff right sock Non-skid slipper socks- Performed by helper: Don/doff right sock, Don/doff left sock     Shoes - Performed by patient: Don/doff right shoe, Don/doff left shoe, Fasten right, Fasten left         TED Hose - Performed by helper: Don/doff right TED hose, Don/doff left TED hose  Lower body assist Assist for lower body dressing: Touching or steadying assistance (  Pt > 75%)      Toileting Toileting Toileting activity did not occur: No continent bowel/bladder event Toileting steps completed by patient: Adjust clothing prior to toileting, Adjust clothing after toileting Toileting steps completed by helper: Adjust clothing prior to toileting, Performs perineal hygiene, Adjust clothing after toileting Toileting Assistive Devices: Grab bar or rail  Toileting  assist Assist level: Touching or steadying assistance (Pt.75%)   Transfers Chair/bed transfer   Chair/bed transfer method: Ambulatory Chair/bed transfer assist level: Touching or steadying assistance (Pt > 75%) Chair/bed transfer assistive device: Armrests, Medical sales representative     Max distance: 75 Assist level: Touching or steadying assistance (Pt > 75%)   Wheelchair   Type: Manual Max wheelchair distance: 150 ft Assist Level: Supervision or verbal cues  Cognition Comprehension Comprehension assist level: Follows complex conversation/direction with no assist  Expression Expression assist level: Expresses basic 90% of the time/requires cueing < 10% of the time.  Social Interaction Social Interaction assist level: Interacts appropriately 90% of the time - Needs monitoring or encouragement for participation or interaction.  Problem Solving Problem solving assist level: Solves basic 75 - 89% of the time/requires cueing 10 - 24% of the time  Memory Memory assist level: Recognizes or recalls 50 - 74% of the time/requires cueing 25 - 49% of the time    Medical Problem List and Plan: 1. Decreased functional mobilitysecondary to left intertrochanteric hip fracture status post IM nailing on 03/30/17.   Partial weight bearing 25 pounds  Cont CIR   D/c sutures 2. DVT Prophylaxis/Anticoagulation: Xarelto.  Vascular study neg for DVT  3. Pain Management: Hydrocodone and Robaxin as needed 4. Mood: Provide emotional support 5. Neuropsych: This patient iscapable of making decisions on hisown behalf. 6. Skin/Wound Care: Routine skin checks 7. Fluids/Electrolytes/Nutrition: Routine I&Os  BMP within acceptable range on 6/12, slightly elevated BUN  Encourage fluids  Labs ordered for tomorrow 8.Acute blood loss anemia.   Hb 9.4 on 6/12  Labs ordered for tomorrow 9.Hypertension.   Toprol-XL 50 mg daily.   Patient also on lisinopril 5 mg daily PTA, will resume as  needed  Monitor with increased mobility  Labile this AM, cont to monitor 10.Hyperlipidemia. Pravachol 11. Hypoalbuminemia  Supplement initiated 6/6 12. Bowel incontinence  After several days of constipation  Cont to monitor, bowel meds adjusted, continues to empty  Resolved 13. Nocturnal enuresis  Timed voiding prior to sleep  Resolved  LOS (Days) 13 A FACE TO FACE EVALUATION WAS PERFORMED  Kresha Abelson Lorie Phenix 04/14/2017 8:49 AM

## 2017-04-14 NOTE — Progress Notes (Signed)
Physical Therapy Session Note  Patient Details  Name: [name] MRN: [id number] Date of Birth: 1931-11-12  Today's Date: 04/14/2017 PT Individual Time: 0902-1000 PT Individual Time Calculation (min): 58 min   Short Term Goals: Week 2:  PT Short Term Goal 1 (Week 2): Pt to perform supine>sitting with min assist.  PT Short Term Goal 2 (Week 2): Pt to ambulate 50 ft with rw and assistance as needed.  PT Short Term Goal 3 (Week 2): Pt to perform sit<>stand with min assist. PT Short Term Goal 4 (Week 2): Pt to be able to independently advance LLE during gait for 30 ft or more.  Skilled Therapeutic Interventions/Progress Updates:    Pt sitting in w/c upon arrival, agreeable to PT session. Pt propelling w/c to/from gym with supervision, difficulty with straight trajectory. Ambulation: 90 ft X1, 50 ft X1, 15 ft X1 with rw and min guard assist. Exercise:  Standing: knee flexion, march, hip extension, hip abduction - each with LLE and 1X10 (using rw for support and sit<>stand between each exercise). Transfers: sit<>stand supervision with occasional cues. Working on controlling descent and LLE position with sit and stand. Following session, pt returned to room, up in w/c with all needs in reach.   Therapy Documentation Precautions:  Precautions Precautions: Fall Restrictions Weight Bearing Restrictions: Yes LLE Weight Bearing: Partial weight bearing LLE Partial Weight Bearing Percentage or Pounds: 25% Pain:  3-4 with activity in Lt hip   See Function Navigator for Current Functional Status.   Therapy/Group: Individual Therapy  Linard Millers, PT 04/14/2017, 9:38 AM

## 2017-04-14 NOTE — Progress Notes (Signed)
Social Work Patient ID: [name], male   DOB: 10/23/1932, 83 y.o.   MRN: [id number]  Spoke with daughter-[name] via telephone to discuss discharge plan. Have her preferences and will look for available bed today. FL2 sent out and will await bed offer. May need to extend discharge, will work on today.

## 2017-04-14 NOTE — Plan of Care (Signed)
Problem: RH Balance Goal: LTG Patient will maintain dynamic standing with ADLs (OT) LTG:  Patient will maintain dynamic standing balance with assist during activities of daily living (OT)   Downgraded based on LOS and change in discharge plan.

## 2017-04-14 NOTE — Progress Notes (Signed)
Physical Therapy Discharge Summary  Patient Details  Name: [name] MRN: [id number] Date of Birth: [birth year]  Today's Date: 04/14/2017 PT Individual Time: 3143-8887 PT Individual Time Calculation (min): 47 min    Patient has met 6 of 10 long term goals due to improved activity tolerance, improved balance, increased strength, increased range of motion, decreased pain and ability to compensate for deficits.  Patient to discharge at a wheelchair level for community distances and ambulatory for short household distances at Supervision to min assist level.  Patient's care partner unavailable to provide the necessary physical assistance at discharge. Pt to be going to SNF for further rehabilitation.   Reasons goals not met: Pt made slower than anticipated progress with ambulation goals. Pt unable to attempt stairs safety at this time.   Recommendation:  Patient will benefit from ongoing skilled PT services in skilled nursing facility setting to continue to advance safe functional mobility, address ongoing impairments in strength, ROM, balance, bed mobility, transfers, and minimize fall risk.  Equipment: To be addressed at next venue  Reasons for discharge: pt is needing further rehabilitation at SNF at this time.   Patient/family agrees with progress made and goals achieved: Yes  PT Discharge Precautions/Restrictions Precautions Precautions: Fall Restrictions Weight Bearing Restrictions: Yes LLE Weight Bearing: Partial weight bearing LLE Partial Weight Bearing Percentage or Pounds: 25% Pain Pain Assessment Pain Score: 3  Vision/Perception  Vision - History Baseline Vision: No visual deficits Vision - Assessment Eye Alignment: Within Functional Limits Perception Perception: Within Functional Limits  Cognition Overall Cognitive Status: Within Functional Limits for tasks assessed Arousal/Alertness: Awake/alert Orientation Level: Oriented X4 Focused Attention: Appears  intact Memory: Appears intact Awareness: Appears intact Problem Solving: Appears intact Safety/Judgment: Appears intact Comments: Pt needing occasional cues for task and activity sequence.  Sensation Sensation Light Touch: Appears Intact Proprioception: Appears Intact Coordination Gross Motor Movements are Fluid and Coordinated: Yes Fine Motor Movements are Fluid and Coordinated: Yes Motor  Motor Motor: Within Functional Limits Motor - Discharge Observations: decreased LLE strength   Balance Static Sitting Balance Static Sitting - Balance Support: Feet supported Static Sitting - Level of Assistance: 7: Independent Dynamic Sitting Balance Dynamic Sitting - Balance Support: No upper extremity supported Dynamic Sitting - Level of Assistance: 7: Independent Static Standing Balance Static Standing - Balance Support: Bilateral upper extremity supported Static Standing - Level of Assistance: 5: Stand by assistance Dynamic Standing Balance Dynamic Standing - Balance Support: Bilateral upper extremity supported;During functional activity Dynamic Standing - Level of Assistance: 5: Stand by assistance Extremity Assessment  RUE Assessment RUE Assessment: Within Functional Limits LUE Assessment LUE Assessment: Within Functional Limits RLE Assessment RLE Assessment: Within Functional Limits LLE Assessment LLE Assessment: Exceptions to [facility] LLE PROM (degrees) Left Hip Flexion: 90 degrees Left Hip ABduction: 20 degrees LLE Strength LLE Overall Strength Comments: knee extension 2+/5, 2/5, hip abduction 2+/5, 3-/5.    See Function Navigator for Current Functional Status.  Linard Millers, PT 04/14/2017, 3:43 PM

## 2017-04-14 NOTE — Discharge Summary (Signed)
Discharge summary job [id number]

## 2017-04-14 NOTE — Progress Notes (Signed)
Social Work  Discharge Note  The overall goal for the admission was met for:   Discharge location: Yes-[facility]-SNF  Length of Stay: Yes-14 DAYS  Discharge activity level: Yes-SUPERVISION-MIN ASSIST LEVEL  Home/community participation: Yes  Services provided included: MD, RD, PT, OT, RN, CM, Pharmacy and SW  Financial Services: Medicare  Follow-up services arranged: Other: SHORT TERM NHP  Comments (or additional information):PT NEEDS MORE [place]  Patient/Family verbalized understanding of follow-up arrangements: Yes  Individual responsible for coordination of the follow-up plan: SELF & [name]-DAUGHTER  Confirmed correct DME delivered: Elease Hashimoto 04/14/2017    Elease Hashimoto

## 2017-04-14 NOTE — Progress Notes (Signed)
Social Work Patient ID: [name], male   DOB: [birth year], 83 y.o.   MRN: [id number]  Spoke with [facility] who has offered a bed for pt tomorrow. Discussed with daughter and pt and are in agreement. The plan for transfer tomorrow. Daughter to contact kelly and set up time for paperwork.

## 2017-04-14 NOTE — NC FL2 (Signed)
[place] LEVEL OF CARE SCREENING TOOL     IDENTIFICATION  Patient Name: [name] Birthdate: 1932-07-26 Sex: male Admission Date (Current Location): 04/01/2017  [facility] and [place] Number:  Herbalist and Address:  [facility], [place] [address], [place], [place] [address]      Provider Number: [id number]  Attending Physician Name and Address:  [name], MD  Relative Name and Phone Number:  [name] Cummiongs-daughter [phone]    Current Level of Care: Other (Comment) (rehab) Recommended Level of Care: [place] Prior Approval Number:    Date Approved/Denied:   PASRR Number: [id number]  Discharge Plan: SNF    Current Diagnoses: Patient Active Problem List   Diagnosis Date Noted  . Elevated BUN   . Nocturnal enuresis   . Fall   . Full incontinence of feces   . Benign essential HTN   . Post-operative pain   . Hypoalbuminemia due to protein-calorie malnutrition ([place])   . Abnormality of gait   . Closed intertrochanteric fracture of left hip ([place]) 04/01/2017  . Acute blood loss anemia   . Leg edema   . Hip fracture ([place]) 03/30/2017  . Leukocytosis 03/29/2017  . Closed left femoral fracture ([place]) 03/29/2017  . Dehydration 03/29/2017  . Closed left hip fracture ([place]) 03/29/2017  . Mild intermittent asthma with acute exacerbation 01/29/2017  . Chronic renal insufficiency 09/22/2015  . Tachycardia 09/22/2015  . PVC (premature ventricular contraction) 09/22/2015  . Osteoporosis/osteopenia increased risk 06/03/2014  . Basal cell carcinoma of face 06/03/2014  . BPH (benign prostatic hypertrophy) 05/27/2013  . Eczema 04/23/2012  . HTN (hypertension) 04/23/2012  . Dyslipidemia 04/23/2012    Orientation RESPIRATION BLADDER Height & Weight     Self, Time, Situation, Place  Normal Continent Weight: 148 lb 11.2 oz (67.4 kg) Height:  5\' 8"  (172.7 cm)  BEHAVIORAL SYMPTOMS/MOOD NEUROLOGICAL BOWEL  NUTRITION STATUS      Continent Diet (regular)  AMBULATORY STATUS COMMUNICATION OF NEEDS Skin   Limited Assist Verbally Surgical wounds                       Personal Care Assistance Level of Assistance  Bathing, Dressing Bathing Assistance: Limited assistance   Dressing Assistance: Limited assistance     Functional Limitations Info  Sight Sight Info: Impaired        SPECIAL CARE FACTORS FREQUENCY  PT (By licensed PT), OT (By licensed OT)     PT Frequency: 5x week OT Frequency: 5x week            Contractures Contractures Info: Not present    Additional Factors Info  Code Status Code Status Info: DNR             Current Medications (04/14/2017):  This is the current hospital active medication list Current Facility-Administered Medications  Medication Dose Route Frequency Provider Last Rate Last Dose  . feeding supplement (PRO-STAT SUGAR FREE 64) liquid 30 mL  30 mL Oral BID [name], MD   30 mL at 04/14/17 0912  . HYDROcodone-acetaminophen (NORCO/VICODIN) 5-325 MG per tablet 1-2 tablet  1-2 tablet Oral Q6H PRN Cathlyn Parsons, PA-C   2 tablet at 04/11/17 7619  . methocarbamol (ROBAXIN) tablet 500 mg  500 mg Oral Q6H PRN Angiulli, Lavon Paganini, PA-C       Or  . methocarbamol (ROBAXIN) 500 mg in dextrose 5 % 50 mL IVPB  500 mg Intravenous Q6H  PRN Cathlyn Parsons, PA-C      . metoprolol succinate (TOPROL-XL) 24 hr tablet 50 mg  50 mg Oral Daily Cathlyn Parsons, PA-C   50 mg at 04/14/17 0911  . ondansetron (ZOFRAN) tablet 4 mg  4 mg Oral Q6H PRN Angiulli, Lavon Paganini, PA-C       Or  . ondansetron [facility]) injection 4 mg  4 mg Intravenous Q6H PRN Angiulli, Lavon Paganini, PA-C      . pravastatin (PRAVACHOL) tablet 40 mg  40 mg Oral Daily Cathlyn Parsons, PA-C   40 mg at 04/14/17 0912  . rivaroxaban (XARELTO) tablet 10 mg  10 mg Oral Daily Cathlyn Parsons, PA-C   10 mg at 04/14/17 0912  . senna-docusate (Senokot-S) tablet 1 tablet  1 tablet Oral QPC  breakfast [name], MD   1 tablet at 04/13/17 0855  . sorbitol 70 % solution 30 mL  30 mL Oral Daily PRN Cathlyn Parsons, PA-C   30 mL at 04/11/17 0754  . triamcinolone cream (KENALOG) 0.1 %   Topical BID Cathlyn Parsons, PA-C         Discharge Medications: Please see discharge summary for a list of discharge medications.  Relevant Imaging Results:  Relevant Lab Results:   Additional Information    Teriyah Purington, Gardiner Rhyme, LCSW

## 2017-04-14 NOTE — Discharge Summary (Signed)
[name], [name] NO.:  [id number]  MEDICAL RECORD NO.:  [id number]  LOCATION:                                 FACILITY:  PHYSICIAN:  Delice Lesch, MD        DATE OF BIRTH:  1932-06-24  DATE OF ADMISSION:  04/01/2017 DATE OF DISCHARGE:  04/15/2017                              DISCHARGE SUMMARY   DISCHARGE DIAGNOSES: 1. Left intertrochanteric hip fracture with intramedullary nailing,     March 30, 2017.  Xarelto for deep vein thrombosis prophylaxis. 2. Pain management. 3. Acute blood loss anemia. 4. Hypertension. 5. Hyperlipidemia. 6. Constipation.  HISTORY OF PRESENT ILLNESS:  This is an 83 year old right-handed male, history of hypertension, CKD stage 2, chronic leg edema.  The patient independent, living alone prior to admission.  Has a supportive daughter.  Presented on March 29, 2017, after a fall and was down for an extended time.  No loss of consciousness.  X-rays and imaging revealed left intertrochanteric hip fracture.  Underwent IM nailing on March 30, 2017, per Dr. Marlou Moore.  Partial weightbearing left lower extremity, acute blood loss anemia 9.1 and monitored.  Maintained on Xarelto for DVT prophylaxis.  Physical and Occupational therapy ongoing.  The patient was admitted for a comprehensive rehab program.  PAST MEDICAL HISTORY:  See discharge diagnoses.  SOCIAL HISTORY:  Independent, living alone prior to admission.  FUNCTIONAL STATUS:  Upon admission to rehab services was minimal assist 5 feet, rolling walker; moderate assist, sit to stand; min-to-mod assist activities of daily living.  PHYSICAL EXAMINATION:  VITAL SIGNS:  Blood pressure 106/55, pulse 56, temperature 97, respirations 16. GENERAL:  Alert male, in no acute distress.  EOMs intact. NECK:  Supple.  Nontender.  No JVD. CARDIAC:  Rate regular rate and rhythm.  No murmur. ABDOMEN:  Soft, nontender.  Good bowel sounds. LUNGS:  Clear to auscultation without wheeze. EXTREMITIES:  Left  thigh with some surrounding edema.  Surgical site clean and dry.  REHABILITATION HOSPITAL COURSE:  The patient was admitted to inpatient rehab services with therapies initiated on a 3-hour daily basis, consisting of physical therapy, occupational therapy, and rehabilitation nursing.  The following issues were addressed during the patient's rehabilitation stay.  Pertaining to [name], left intertrochanteric hip fracture.  He had undergone IM nailing on March 30, 2017, per Dr. Marcene Moore, partial weightbearing 25 pounds, neurovascular sensation intact.  He continued on Xarelto for DVT prophylaxis.  Venous Doppler study is negative.  Pain management with the use of hydrocodone as well as Robaxin with good results.  Acute blood loss anemia stable.  No bleeding episodes.  Latest hemoglobin of 9.3.  Blood pressure is well controlled on Toprol 50 mg daily.  No orthostatic blood pressures reported.  Bouts of constipation resolved with laxative assistance.  The patient received weekly collaborative interdisciplinary team conferences to discuss estimated length of stay, family teaching, any barriers to his discharge.  The patient can don his pants with minimal assistance using a reacher, propel his wheelchair to the rehab gym with supervision, stand pivot transfers, wheelchair to mat with minimal assistance.  Ambulate 50 feet rolling walker.  Working with  energy conservation techniques.  Gather belongings for activities of daily living and homemaking.  Stand pivot transfers, wheelchair to bed.  Due to limited assistance at home, it was felt skilled nursing facility was needed with bed becoming available on April 15, 2017.  DISCHARGE MEDICATIONS: 1. Toprol-XL 50 mg p.o. daily. 2. Pravachol 40 mg p.o. daily. 3. Xarelto 10 mg p.o. daily. 4. Senokot S 1 tablet p.o. breakfast, hold for loose stools. 5. Hydrocodone 1-2 tablets every 6 hours as needed for pain. 6. Robaxin 500 mg p.o. every 6 hours  as needed for muscle spasms.  DIET:  His diet was regular.  The patient would follow up with [facility], call for appointment; Dr. Delice Lesch as directed.  SPECIAL INSTRUCTIONS:  Partial weightbearing left lower extremity.     [name], P.A.   ______________________________ Delice Lesch, MD    DA/MEDQ  D:  04/14/2017  T:  04/14/2017  Job:  [id number]  cc:   [name], M.D. [name], M.D. Delice Lesch, MD

## 2017-04-15 DIAGNOSIS — N3944 Nocturnal enuresis: Secondary | ICD-10-CM | POA: Diagnosis not present

## 2017-04-15 DIAGNOSIS — R269 Unspecified abnormalities of gait and mobility: Secondary | ICD-10-CM | POA: Diagnosis not present

## 2017-04-15 DIAGNOSIS — Z4789 Encounter for other orthopedic aftercare: Secondary | ICD-10-CM | POA: Diagnosis not present

## 2017-04-15 DIAGNOSIS — R278 Other lack of coordination: Secondary | ICD-10-CM | POA: Diagnosis not present

## 2017-04-15 DIAGNOSIS — S72009A Fracture of unspecified part of neck of unspecified femur, initial encounter for closed fracture: Secondary | ICD-10-CM | POA: Diagnosis not present

## 2017-04-15 DIAGNOSIS — R52 Pain, unspecified: Secondary | ICD-10-CM | POA: Diagnosis not present

## 2017-04-15 DIAGNOSIS — M6281 Muscle weakness (generalized): Secondary | ICD-10-CM | POA: Diagnosis not present

## 2017-04-15 DIAGNOSIS — R159 Full incontinence of feces: Secondary | ICD-10-CM | POA: Diagnosis not present

## 2017-04-15 DIAGNOSIS — I10 Essential (primary) hypertension: Secondary | ICD-10-CM | POA: Diagnosis not present

## 2017-04-15 DIAGNOSIS — Z9181 History of falling: Secondary | ICD-10-CM | POA: Diagnosis not present

## 2017-04-15 DIAGNOSIS — S72145A Nondisplaced intertrochanteric fracture of left femur, initial encounter for closed fracture: Secondary | ICD-10-CM | POA: Diagnosis not present

## 2017-04-15 DIAGNOSIS — S8990XA Unspecified injury of unspecified lower leg, initial encounter: Secondary | ICD-10-CM | POA: Diagnosis not present

## 2017-04-15 DIAGNOSIS — R7989 Other specified abnormal findings of blood chemistry: Secondary | ICD-10-CM | POA: Diagnosis not present

## 2017-04-15 DIAGNOSIS — R2689 Other abnormalities of gait and mobility: Secondary | ICD-10-CM | POA: Diagnosis not present

## 2017-04-15 DIAGNOSIS — S72142S Displaced intertrochanteric fracture of left femur, sequela: Secondary | ICD-10-CM | POA: Diagnosis not present

## 2017-04-15 DIAGNOSIS — K5900 Constipation, unspecified: Secondary | ICD-10-CM | POA: Diagnosis not present

## 2017-04-15 DIAGNOSIS — E784 Other hyperlipidemia: Secondary | ICD-10-CM | POA: Diagnosis not present

## 2017-04-15 LAB — CBC WITH DIFFERENTIAL/PLATELET
BASOS ABS: 0 10*3/uL (ref 0.0–0.1)
BASOS PCT: 1 %
EOS ABS: 0.4 10*3/uL (ref 0.0–0.7)
Eosinophils Relative: 6 %
HCT: 32.9 % — ABNORMAL LOW (ref 39.0–52.0)
Hemoglobin: 10.5 g/dL — ABNORMAL LOW (ref 13.0–17.0)
Lymphocytes Relative: 26 %
Lymphs Abs: 1.7 10*3/uL (ref 0.7–4.0)
MCH: 31.5 pg (ref 26.0–34.0)
MCHC: 31.9 g/dL (ref 30.0–36.0)
MCV: 98.8 fL (ref 78.0–100.0)
MONO ABS: 0.4 10*3/uL (ref 0.1–1.0)
MONOS PCT: 5 %
NEUTROS PCT: 62 %
Neutro Abs: 4.2 10*3/uL (ref 1.7–7.7)
Platelets: 374 10*3/uL (ref 150–400)
RBC: 3.33 MIL/uL — ABNORMAL LOW (ref 4.22–5.81)
RDW: 14.2 % (ref 11.5–15.5)
WBC: 6.6 10*3/uL (ref 4.0–10.5)

## 2017-04-15 LAB — BASIC METABOLIC PANEL
Anion gap: 9 (ref 5–15)
BUN: 19 mg/dL (ref 6–20)
CALCIUM: 8.8 mg/dL — AB (ref 8.9–10.3)
CO2: 24 mmol/L (ref 22–32)
CREATININE: 1.01 mg/dL (ref 0.61–1.24)
Chloride: 104 mmol/L (ref 101–111)
Glucose, Bld: 108 mg/dL — ABNORMAL HIGH (ref 65–99)
Potassium: 3.8 mmol/L (ref 3.5–5.1)
Sodium: 137 mmol/L (ref 135–145)

## 2017-04-15 NOTE — Progress Notes (Signed)
[facility] PHYSICAL MEDICINE & REHABILITATION     PROGRESS NOTE  Subjective/Complaints:  Pt seen laying in bed this AM.  He slept well overnight and is ready for discharge.  ROS: Denies CP, SOB, N/V/D.  Objective: Vital Signs: Blood pressure 127/82, pulse 63, temperature 97.6 F (36.4 C), temperature source Oral, resp. rate 17, height 5\' 8"  (1.727 m), weight 67.4 kg (148 lb 11.2 oz), SpO2 97 %. No results found.  Recent Labs  04/15/17 0726  WBC 6.6  HGB 10.5*  HCT 32.9*  PLT 374    Recent Labs  04/15/17 0726  NA 137  K 3.8  CL 104  GLUCOSE 108*  BUN 19  CREATININE 1.01  CALCIUM 8.8*   CBG (last 3)  No results for input(s): GLUCAP in the last 72 hours.  Wt Readings from Last 3 Encounters:  04/09/17 67.4 kg (148 lb 11.2 oz)  04/01/17 67.1 kg (148 lb)  01/29/17 67.1 kg (148 lb)    Physical Exam:  BP 127/82 (BP Location: Left Arm)   Pulse 63   Temp 97.6 F (36.4 C) (Oral)   Resp 17   Ht 5\' 8"  (1.727 m)   Wt 67.4 kg (148 lb 11.2 oz)   SpO2 97%   BMI 22.61 kg/m  Constitutional: He appears well-developed. NAD. HENT: Normocephalic. Atraumatic Eyes: EOMare normal. No discharge. Cardiovascular: RRR. No JVD. Respiratory: Effort normal and breath sounds normal.  GI: Soft. Bowel sounds are normal.  Musculoskeletal: LLE edema and tenderness Neurological: He is alertand oriented 5/5 B/l UE motor.  RLE 4/5 proximally, 5/5 distally.  LLE: HF 2/5,  KE 3/5, ADF/PF 4+/5  Skin. Surgical site c/d/i Psychiatric: He has a normal mood and affect. His behavior is normal.   Assessment/Plan: 1. Functional deficits secondary to Left intertrochanteric hip fracture which require 3+ hours per day of interdisciplinary therapy in a comprehensive inpatient rehab setting. Physiatrist is providing close team supervision and 24 hour management of active medical problems listed below. Physiatrist and rehab team continue to assess barriers to discharge/monitor patient progress  toward functional and medical goals.  Function:  Bathing Bathing position   Position: Wheelchair/chair at sink  Bathing parts Body parts bathed by patient: Right arm, Left arm, Chest, Abdomen, Front perineal area, Right upper leg, Left upper leg, Right lower leg, Left lower leg, Buttocks Body parts bathed by helper: Back  Bathing assist        Upper Body Dressing/Undressing Upper body dressing   What is the patient wearing?: Pull over shirt/dress     Pull over shirt/dress - Perfomed by patient: Put head through opening, Thread/unthread left sleeve, Pull shirt over trunk, Thread/unthread right sleeve          Upper body assist Assist Level: Supervision or verbal cues      Lower Body Dressing/Undressing Lower body dressing   What is the patient wearing?: Ted Hose, Pants, Non-skid slipper socks Underwear - Performed by patient: Thread/unthread right underwear leg, Thread/unthread left underwear leg Underwear - Performed by helper: Pull underwear up/down Pants- Performed by patient: Thread/unthread right pants leg, Thread/unthread left pants leg, Pull pants up/down Pants- Performed by helper: Pull pants up/down Non-skid slipper socks- Performed by patient: Don/doff right sock, Don/doff left sock Non-skid slipper socks- Performed by helper: Don/doff right sock, Don/doff left sock     Shoes - Performed by patient: Don/doff right shoe, Don/doff left shoe, Fasten right, Fasten left         TED Hose - Performed by helper:  Don/doff right TED hose, Don/doff left TED hose  Lower body assist Assist for lower body dressing: Touching or steadying assistance (Pt > 75%)      Toileting Toileting Toileting activity did not occur: No continent bowel/bladder event Toileting steps completed by patient: Adjust clothing prior to toileting, Adjust clothing after toileting Toileting steps completed by helper: Adjust clothing prior to toileting, Performs perineal hygiene, Adjust clothing after  toileting Toileting Assistive Devices: Grab bar or rail  Toileting assist Assist level: Touching or steadying assistance (Pt.75%)   Transfers Chair/bed transfer   Chair/bed transfer method: Ambulatory Chair/bed transfer assist level: Supervision or verbal cues Chair/bed transfer assistive device: Armrests, Medical sales representative     Max distance: 90 ft Assist level: Touching or steadying assistance (Pt > 75%)   Wheelchair   Type: Manual Max wheelchair distance: 150 ft Assist Level: Supervision or verbal cues  Cognition Comprehension Comprehension assist level: Understands complex 90% of the time/cues 10% of the time  Expression Expression assist level: Expresses complex 90% of the time/cues < 10% of the time  Social Interaction Social Interaction assist level: Interacts appropriately 90% of the time - Needs monitoring or encouragement for participation or interaction.  Problem Solving Problem solving assist level: Solves basic 90% of the time/requires cueing < 10% of the time  Memory Memory assist level: Recognizes or recalls 75 - 89% of the time/requires cueing 10 - 24% of the time    Medical Problem List and Plan: 1. Decreased functional mobilitysecondary to left intertrochanteric hip fracture status post IM nailing on 03/30/17.   Partial weight bearing 25 pounds  D/c today  Will see patient in 1 month for hospital follow up 2. DVT Prophylaxis/Anticoagulation: Xarelto.  Vascular study neg for DVT  3. Pain Management: Hydrocodone and Robaxin as needed 4. Mood: Provide emotional support 5. Neuropsych: This patient iscapable of making decisions on hisown behalf. 6. Skin/Wound Care: Routine skin checks 7. Fluids/Electrolytes/Nutrition: Routine I&Os  BMP within acceptable range on 6/19  Encourage fluids 8.Acute blood loss anemia.   Hb 10.5 on 6/19 9.Hypertension.   Toprol-XL 50 mg daily.   Patient also on lisinopril 5 mg daily PTA, will resume as  needed  Monitor with increased mobility  Controlled 6/19 10.Hyperlipidemia. Pravachol 11. Hypoalbuminemia  Supplement initiated 6/6 12. Bowel incontinence  After several days of constipation  Cont to monitor, bowel meds adjusted, continues to empty  Resolved 13. Nocturnal enuresis  Timed voiding prior to sleep  Resolved  LOS (Days) 14 A FACE TO FACE EVALUATION WAS PERFORMED  Brunetta [place] Lorie Phenix 04/15/2017 8:37 AM

## 2017-04-15 NOTE — Plan of Care (Signed)
Problem: RH SKIN INTEGRITY Goal: RH STG SKIN FREE OF INFECTION/BREAKDOWN Skin free of infection/breakdown with min assist  Outcome: Completed/Met Date Met: 04/15/17 Discharged with no skin break down

## 2017-04-15 NOTE — Progress Notes (Signed)
Report given to Rubin Payor LPN at [facility]. Ambulance called by [facility] CSW.

## 2017-04-21 ENCOUNTER — Encounter (INDEPENDENT_AMBULATORY_CARE_PROVIDER_SITE_OTHER): Payer: Self-pay | Admitting: Orthopedic Surgery

## 2017-04-21 ENCOUNTER — Ambulatory Visit (INDEPENDENT_AMBULATORY_CARE_PROVIDER_SITE_OTHER): Payer: Medicare Other

## 2017-04-21 ENCOUNTER — Ambulatory Visit (INDEPENDENT_AMBULATORY_CARE_PROVIDER_SITE_OTHER): Payer: Medicare Other | Admitting: Orthopedic Surgery

## 2017-04-21 DIAGNOSIS — S72142S Displaced intertrochanteric fracture of left femur, sequela: Secondary | ICD-10-CM

## 2017-04-21 NOTE — Progress Notes (Signed)
   Post-Op Visit Note   Patient: [name]           Date of Birth: 05-30-1932           MRN: [id number] Visit Date: 04/21/2017 PCP: Wardell Honour, MD   Assessment & Plan:  Chief Complaint:  Chief Complaint  Patient presents with  . Left Leg - Routine Post Op   Visit Diagnoses:  1. Closed displaced intertrochanteric fracture of left femur, sequela     Plan: [name] is an 83 year old patient who is doing well following intertrochanteric fracture fixation of the left hip.  I'm going to allow him to be weightbearing as tolerated and will come back in 3 weeks for clinical recheck repeat radiographs and likely release.  Follow-Up Instructions: No Follow-up on file.   Orders:  Orders Placed This Encounter  Procedures  . XR FEMUR MIN 2 VIEWS LEFT   No orders of the defined types were placed in this encounter.   Imaging: Xr Femur Min 2 Views Left  Result Date: 04/21/2017 AP lateral left femur reviewed.  Intramedullary nail transfixing high intertrochanteric femur fracture is noted.  Good position and alignment of the prosthesis is observed.  No evidence of complicating features.   PMFS History: Patient Active Problem List   Diagnosis Date Noted  . Elevated BUN   . Nocturnal enuresis   . Fall   . Full incontinence of feces   . Benign essential HTN   . Post-operative pain   . Hypoalbuminemia due to protein-calorie malnutrition ([place])   . Abnormality of gait   . Closed intertrochanteric fracture of left hip ([place]) 04/01/2017  . Acute blood loss anemia   . Leg edema   . Hip fracture ([place]) 03/30/2017  . Leukocytosis 03/29/2017  . Closed left femoral fracture ([place]) 03/29/2017  . Dehydration 03/29/2017  . Closed left hip fracture ([place]) 03/29/2017  . Mild intermittent asthma with acute exacerbation 01/29/2017  . Chronic renal insufficiency 09/22/2015  . Tachycardia 09/22/2015  . PVC (premature ventricular contraction) 09/22/2015  . Osteoporosis/osteopenia increased risk  06/03/2014  . Basal cell carcinoma of face 06/03/2014  . BPH (benign prostatic hypertrophy) 05/27/2013  . Eczema 04/23/2012  . HTN (hypertension) 04/23/2012  . Dyslipidemia 04/23/2012   Past Medical History:  Diagnosis Date  . Allergy    seasonal  fall  . Cancer ([place])    Basal cell carcinoma; multiple  . Eczema   . Hyperlipidemia   . Hypertension     Family History  Problem Relation Age of Onset  . Arthritis Mother   . Heart disease Mother 45  . Hypertension Father     Past Surgical History:  Procedure Laterality Date  . CATARACT EXTRACTION, BILATERAL  06/29/2015  . INTRAMEDULLARY (IM) NAIL INTERTROCHANTERIC Left 03/30/2017   Procedure: INTRAMEDULLARY (IM) NAIL INTERTROCHANTRIC;  Surgeon: [name], MD;  Location: [place];  Service: Orthopedics;  Laterality: Left;  . MOHS SURGERY    . TONSILLECTOMY  1940 maybe   Social History   Occupational History  . retired    Social History Main Topics  . Smoking status: Never Smoker  . Smokeless tobacco: Never Used  . Alcohol use No     Comment: occas  . Drug use: No  . Sexual activity: Not Currently

## 2017-05-09 ENCOUNTER — Telehealth (INDEPENDENT_AMBULATORY_CARE_PROVIDER_SITE_OTHER): Payer: Self-pay | Admitting: Orthopedic Surgery

## 2017-05-09 DIAGNOSIS — M6281 Muscle weakness (generalized): Secondary | ICD-10-CM | POA: Diagnosis not present

## 2017-05-09 DIAGNOSIS — S72145D Nondisplaced intertrochanteric fracture of left femur, subsequent encounter for closed fracture with routine healing: Secondary | ICD-10-CM | POA: Diagnosis not present

## 2017-05-09 DIAGNOSIS — R0602 Shortness of breath: Secondary | ICD-10-CM | POA: Diagnosis not present

## 2017-05-09 DIAGNOSIS — Z9181 History of falling: Secondary | ICD-10-CM | POA: Diagnosis not present

## 2017-05-09 DIAGNOSIS — I10 Essential (primary) hypertension: Secondary | ICD-10-CM | POA: Diagnosis not present

## 2017-05-09 NOTE — Telephone Encounter (Signed)
IC verbal order given.  °

## 2017-05-09 NOTE — Telephone Encounter (Signed)
[facility] REQUESTING VERBAL FOR 2X WK FOR 5 WKS. ALSO REQUESTS EVAL FOR MED MANAGEMENT.  [place]

## 2017-05-09 NOTE — Telephone Encounter (Signed)
THERAPIST ALSO ADVISED PT WAS ON THE FLOOR WHEN SHE SAW HIM, BUT PT DENIED HAVING ANY PAIN WHEN SHE HELPED HIM UP.  [phone]

## 2017-05-12 ENCOUNTER — Ambulatory Visit (INDEPENDENT_AMBULATORY_CARE_PROVIDER_SITE_OTHER): Payer: Medicare Other | Admitting: Orthopedic Surgery

## 2017-05-12 ENCOUNTER — Encounter (INDEPENDENT_AMBULATORY_CARE_PROVIDER_SITE_OTHER): Payer: Self-pay | Admitting: Orthopedic Surgery

## 2017-05-12 ENCOUNTER — Ambulatory Visit (INDEPENDENT_AMBULATORY_CARE_PROVIDER_SITE_OTHER): Payer: Medicare Other

## 2017-05-12 DIAGNOSIS — S72142S Displaced intertrochanteric fracture of left femur, sequela: Secondary | ICD-10-CM | POA: Diagnosis not present

## 2017-05-12 NOTE — Progress Notes (Signed)
Post-Op Visit Note   Patient: [name]           Date of Birth: February 10, 1932           MRN: [id number] Visit Date: 05/12/2017 PCP: Wardell Honour, MD   Assessment & Plan:  Chief Complaint:  Chief Complaint  Patient presents with  . Left Leg - Routine Post Op   Visit Diagnoses:  1. Closed displaced intertrochanteric fracture of left femur, sequela     Plan: Welton is an 83 year old patient with intertrochanteric fracture left femur 6 weeks out.  He is doing well in terms of walking but he is having some balance issues and is followed twice.  I examined ankles knees and hips and no evidence of fracture or crepitus grinding.  No real pain with range of motion of that left hip.  Does have a little bit of left hip flexor weakness which is predictable.  Bilateral upper extremities reasonable passive range of motion elbow shoulders and wrists.  Radial grafts show healing of the fracture plan is weightbearing as tolerated with gait training with physical therapy.  He may need to get his balance issues workedup.  I'll see him back as needed.  Follow-Up Instructions: Return if symptoms worsen or fail to improve.   Orders:  Orders Placed This Encounter  Procedures  . XR FEMUR MIN 2 VIEWS LEFT   No orders of the defined types were placed in this encounter.   Imaging: Xr Femur Min 2 Views Left  Result Date: 05/12/2017 2 views left femur reviewed.  Intramedullary hip screw is in good position.  Fracture intertrochanteric is healed.  No complicating features.   PMFS History: Patient Active Problem List   Diagnosis Date Noted  . Elevated BUN   . Nocturnal enuresis   . Fall   . Full incontinence of feces   . Benign essential HTN   . Post-operative pain   . Hypoalbuminemia due to protein-calorie malnutrition ([place])   . Abnormality of gait   . Closed intertrochanteric fracture of left hip ([place]) 04/01/2017  . Acute blood loss anemia   . Leg edema   . Hip fracture ([place]) 03/30/2017  .  Leukocytosis 03/29/2017  . Closed left femoral fracture ([place]) 03/29/2017  . Dehydration 03/29/2017  . Closed left hip fracture ([place]) 03/29/2017  . Mild intermittent asthma with acute exacerbation 01/29/2017  . Chronic renal insufficiency 09/22/2015  . Tachycardia 09/22/2015  . PVC (premature ventricular contraction) 09/22/2015  . Osteoporosis/osteopenia increased risk 06/03/2014  . Basal cell carcinoma of face 06/03/2014  . BPH (benign prostatic hypertrophy) 05/27/2013  . Eczema 04/23/2012  . HTN (hypertension) 04/23/2012  . Dyslipidemia 04/23/2012   Past Medical History:  Diagnosis Date  . Allergy    seasonal  fall  . Cancer ([place])    Basal cell carcinoma; multiple  . Eczema   . Hyperlipidemia   . Hypertension     Family History  Problem Relation Age of Onset  . Arthritis Mother   . Heart disease Mother 54  . Hypertension Father     Past Surgical History:  Procedure Laterality Date  . CATARACT EXTRACTION, BILATERAL  06/29/2015  . INTRAMEDULLARY (IM) NAIL INTERTROCHANTERIC Left 03/30/2017   Procedure: INTRAMEDULLARY (IM) NAIL INTERTROCHANTRIC;  Surgeon: [name], MD;  Location: [place] [facility];  Service: Orthopedics;  Laterality: Left;  . MOHS SURGERY    . TONSILLECTOMY  1940 maybe   Social History   Occupational History  . retired    Science writer  History Main Topics  . Smoking status: Never Smoker  . Smokeless tobacco: Never Used  . Alcohol use No     Comment: occas  . Drug use: No  . Sexual activity: Not Currently

## 2017-05-13 DIAGNOSIS — R0602 Shortness of breath: Secondary | ICD-10-CM | POA: Diagnosis not present

## 2017-05-13 DIAGNOSIS — M6281 Muscle weakness (generalized): Secondary | ICD-10-CM | POA: Diagnosis not present

## 2017-05-13 DIAGNOSIS — Z9181 History of falling: Secondary | ICD-10-CM | POA: Diagnosis not present

## 2017-05-13 DIAGNOSIS — I10 Essential (primary) hypertension: Secondary | ICD-10-CM | POA: Diagnosis not present

## 2017-05-13 DIAGNOSIS — S72145D Nondisplaced intertrochanteric fracture of left femur, subsequent encounter for closed fracture with routine healing: Secondary | ICD-10-CM | POA: Diagnosis not present

## 2017-05-15 DIAGNOSIS — Z9181 History of falling: Secondary | ICD-10-CM | POA: Diagnosis not present

## 2017-05-15 DIAGNOSIS — M6281 Muscle weakness (generalized): Secondary | ICD-10-CM | POA: Diagnosis not present

## 2017-05-15 DIAGNOSIS — I10 Essential (primary) hypertension: Secondary | ICD-10-CM | POA: Diagnosis not present

## 2017-05-15 DIAGNOSIS — R0602 Shortness of breath: Secondary | ICD-10-CM | POA: Diagnosis not present

## 2017-05-15 DIAGNOSIS — S72145D Nondisplaced intertrochanteric fracture of left femur, subsequent encounter for closed fracture with routine healing: Secondary | ICD-10-CM | POA: Diagnosis not present

## 2017-05-16 ENCOUNTER — Telehealth: Payer: Self-pay | Admitting: Family Medicine

## 2017-05-16 DIAGNOSIS — Z9181 History of falling: Secondary | ICD-10-CM | POA: Diagnosis not present

## 2017-05-16 DIAGNOSIS — I10 Essential (primary) hypertension: Secondary | ICD-10-CM | POA: Diagnosis not present

## 2017-05-16 DIAGNOSIS — R0602 Shortness of breath: Secondary | ICD-10-CM | POA: Diagnosis not present

## 2017-05-16 DIAGNOSIS — S72145D Nondisplaced intertrochanteric fracture of left femur, subsequent encounter for closed fracture with routine healing: Secondary | ICD-10-CM | POA: Diagnosis not present

## 2017-05-16 DIAGNOSIS — M6281 Muscle weakness (generalized): Secondary | ICD-10-CM | POA: Diagnosis not present

## 2017-05-19 DIAGNOSIS — S72145D Nondisplaced intertrochanteric fracture of left femur, subsequent encounter for closed fracture with routine healing: Secondary | ICD-10-CM | POA: Diagnosis not present

## 2017-05-19 DIAGNOSIS — M6281 Muscle weakness (generalized): Secondary | ICD-10-CM | POA: Diagnosis not present

## 2017-05-19 DIAGNOSIS — R0602 Shortness of breath: Secondary | ICD-10-CM | POA: Diagnosis not present

## 2017-05-19 DIAGNOSIS — I10 Essential (primary) hypertension: Secondary | ICD-10-CM | POA: Diagnosis not present

## 2017-05-19 DIAGNOSIS — Z9181 History of falling: Secondary | ICD-10-CM | POA: Diagnosis not present

## 2017-05-20 ENCOUNTER — Telehealth (INDEPENDENT_AMBULATORY_CARE_PROVIDER_SITE_OTHER): Payer: Self-pay

## 2017-05-20 DIAGNOSIS — I10 Essential (primary) hypertension: Secondary | ICD-10-CM | POA: Diagnosis not present

## 2017-05-20 DIAGNOSIS — S72145D Nondisplaced intertrochanteric fracture of left femur, subsequent encounter for closed fracture with routine healing: Secondary | ICD-10-CM | POA: Diagnosis not present

## 2017-05-20 DIAGNOSIS — R0602 Shortness of breath: Secondary | ICD-10-CM | POA: Diagnosis not present

## 2017-05-20 DIAGNOSIS — M6281 Muscle weakness (generalized): Secondary | ICD-10-CM | POA: Diagnosis not present

## 2017-05-20 DIAGNOSIS — Z9181 History of falling: Secondary | ICD-10-CM | POA: Diagnosis not present

## 2017-05-20 NOTE — Telephone Encounter (Signed)
Please advise 

## 2017-05-20 NOTE — Telephone Encounter (Signed)
IC advised.  

## 2017-05-20 NOTE — Telephone Encounter (Signed)
Ok for skilled nursing. 

## 2017-05-20 NOTE — Telephone Encounter (Signed)
[name] with [facility] would like to know if skilled nursing visit can be added to patient [name].  Stated that she noticed that patient has a minium to moderate shortness of breath with exertion.  CB# is [phone].  Please advise.  Thank You.

## 2017-05-21 ENCOUNTER — Encounter: Payer: Medicare Other | Admitting: Physical Medicine & Rehabilitation

## 2017-05-21 DIAGNOSIS — S72145D Nondisplaced intertrochanteric fracture of left femur, subsequent encounter for closed fracture with routine healing: Secondary | ICD-10-CM | POA: Diagnosis not present

## 2017-05-21 DIAGNOSIS — R0602 Shortness of breath: Secondary | ICD-10-CM | POA: Diagnosis not present

## 2017-05-21 DIAGNOSIS — Z9181 History of falling: Secondary | ICD-10-CM | POA: Diagnosis not present

## 2017-05-21 DIAGNOSIS — I10 Essential (primary) hypertension: Secondary | ICD-10-CM | POA: Diagnosis not present

## 2017-05-21 DIAGNOSIS — M6281 Muscle weakness (generalized): Secondary | ICD-10-CM | POA: Diagnosis not present

## 2017-05-22 ENCOUNTER — Telehealth (INDEPENDENT_AMBULATORY_CARE_PROVIDER_SITE_OTHER): Payer: Self-pay | Admitting: Orthopedic Surgery

## 2017-05-22 DIAGNOSIS — Z9181 History of falling: Secondary | ICD-10-CM | POA: Diagnosis not present

## 2017-05-22 DIAGNOSIS — R0602 Shortness of breath: Secondary | ICD-10-CM | POA: Diagnosis not present

## 2017-05-22 DIAGNOSIS — I10 Essential (primary) hypertension: Secondary | ICD-10-CM | POA: Diagnosis not present

## 2017-05-22 DIAGNOSIS — S72145D Nondisplaced intertrochanteric fracture of left femur, subsequent encounter for closed fracture with routine healing: Secondary | ICD-10-CM | POA: Diagnosis not present

## 2017-05-22 DIAGNOSIS — M6281 Muscle weakness (generalized): Secondary | ICD-10-CM | POA: Diagnosis not present

## 2017-05-22 NOTE — Telephone Encounter (Signed)
[facility] faxed 05/21/17

## 2017-05-23 DIAGNOSIS — I10 Essential (primary) hypertension: Secondary | ICD-10-CM | POA: Diagnosis not present

## 2017-05-23 DIAGNOSIS — R0602 Shortness of breath: Secondary | ICD-10-CM | POA: Diagnosis not present

## 2017-05-23 DIAGNOSIS — M6281 Muscle weakness (generalized): Secondary | ICD-10-CM | POA: Diagnosis not present

## 2017-05-23 DIAGNOSIS — Z9181 History of falling: Secondary | ICD-10-CM | POA: Diagnosis not present

## 2017-05-23 DIAGNOSIS — S72145D Nondisplaced intertrochanteric fracture of left femur, subsequent encounter for closed fracture with routine healing: Secondary | ICD-10-CM | POA: Diagnosis not present

## 2017-05-26 DIAGNOSIS — M6281 Muscle weakness (generalized): Secondary | ICD-10-CM | POA: Diagnosis not present

## 2017-05-26 DIAGNOSIS — R0602 Shortness of breath: Secondary | ICD-10-CM | POA: Diagnosis not present

## 2017-05-26 DIAGNOSIS — S72145D Nondisplaced intertrochanteric fracture of left femur, subsequent encounter for closed fracture with routine healing: Secondary | ICD-10-CM | POA: Diagnosis not present

## 2017-05-26 DIAGNOSIS — Z9181 History of falling: Secondary | ICD-10-CM | POA: Diagnosis not present

## 2017-05-26 DIAGNOSIS — I10 Essential (primary) hypertension: Secondary | ICD-10-CM | POA: Diagnosis not present

## 2017-05-27 DIAGNOSIS — S72145D Nondisplaced intertrochanteric fracture of left femur, subsequent encounter for closed fracture with routine healing: Secondary | ICD-10-CM | POA: Diagnosis not present

## 2017-05-27 DIAGNOSIS — M6281 Muscle weakness (generalized): Secondary | ICD-10-CM | POA: Diagnosis not present

## 2017-05-27 DIAGNOSIS — I10 Essential (primary) hypertension: Secondary | ICD-10-CM | POA: Diagnosis not present

## 2017-05-27 DIAGNOSIS — Z9181 History of falling: Secondary | ICD-10-CM | POA: Diagnosis not present

## 2017-05-27 DIAGNOSIS — R0602 Shortness of breath: Secondary | ICD-10-CM | POA: Diagnosis not present

## 2017-05-28 DIAGNOSIS — S72145D Nondisplaced intertrochanteric fracture of left femur, subsequent encounter for closed fracture with routine healing: Secondary | ICD-10-CM | POA: Diagnosis not present

## 2017-05-28 DIAGNOSIS — M6281 Muscle weakness (generalized): Secondary | ICD-10-CM | POA: Diagnosis not present

## 2017-05-28 DIAGNOSIS — Z9181 History of falling: Secondary | ICD-10-CM | POA: Diagnosis not present

## 2017-05-28 DIAGNOSIS — R0602 Shortness of breath: Secondary | ICD-10-CM | POA: Diagnosis not present

## 2017-05-28 DIAGNOSIS — I10 Essential (primary) hypertension: Secondary | ICD-10-CM | POA: Diagnosis not present

## 2017-05-29 ENCOUNTER — Ambulatory Visit (INDEPENDENT_AMBULATORY_CARE_PROVIDER_SITE_OTHER): Payer: Medicare Other | Admitting: Podiatry

## 2017-05-29 ENCOUNTER — Encounter: Payer: Self-pay | Admitting: Podiatry

## 2017-05-29 VITALS — BP 141/79 | HR 72

## 2017-05-29 DIAGNOSIS — S72145D Nondisplaced intertrochanteric fracture of left femur, subsequent encounter for closed fracture with routine healing: Secondary | ICD-10-CM | POA: Diagnosis not present

## 2017-05-29 DIAGNOSIS — B351 Tinea unguium: Secondary | ICD-10-CM

## 2017-05-29 DIAGNOSIS — Z9181 History of falling: Secondary | ICD-10-CM | POA: Diagnosis not present

## 2017-05-29 DIAGNOSIS — M79676 Pain in unspecified toe(s): Secondary | ICD-10-CM

## 2017-05-29 DIAGNOSIS — I10 Essential (primary) hypertension: Secondary | ICD-10-CM | POA: Diagnosis not present

## 2017-05-29 DIAGNOSIS — M6281 Muscle weakness (generalized): Secondary | ICD-10-CM | POA: Diagnosis not present

## 2017-05-29 DIAGNOSIS — R0602 Shortness of breath: Secondary | ICD-10-CM | POA: Diagnosis not present

## 2017-05-29 NOTE — Progress Notes (Signed)
   Subjective:    Patient ID: Tripp Goins, male    DOB: 1931/12/09, 83 y.o.   MRN: [id number]  HPI this patient presents to the office with chief complaint of long thick painful nails.  This patient is brought into the office by his daughter.  This patient states that it is painful walking and wearing his shoes.  He says the his nails have not been done in over a year and a half at least.  This patient presents the office for preventative foot care services.    Review of Systems  Constitutional: Positive for activity change.  Respiratory: Positive for cough and wheezing.   Cardiovascular: Positive for leg swelling.  Musculoskeletal: Positive for gait problem.  Skin: Positive for rash.  All other systems reviewed and are negative.      Objective:   Physical Exam  Podiatric Exam: Vascular: dorsalis pedis and posterior tibial pulses are palpable bilateral. Capillary return is immediate. Temperature gradient is WNL. Skin turgor WNL  Sensorium: Normal Semmes Weinstein monofilament test. Normal tactile sensation bilaterally. Nail Exam: Pt has thick disfigured discolored nails with subungual debris noted bilateral entire nail hallux through fifth toenails Ulcer Exam: There is no evidence of ulcer or pre-ulcerative changes or infection. Orthopedic Exam: Muscle tone and strength are WNL. No limitations in general ROM. No crepitus or effusions noted. Foot type and digits show no abnormalities. Bony prominences are unremarkable. Skin: No Porokeratosis. No infection or ulcers          Assessment & Plan:  Diagnosis  Onychomycosis  With pain toes  B/L  IE  Debridement of nails  B/L.  There was subungual blister under second toenail right foot..  Neosporin/DSD.  RTC 3 months   Gardiner Barefoot DPM

## 2017-05-30 ENCOUNTER — Telehealth: Payer: Self-pay | Admitting: Family Medicine

## 2017-05-30 ENCOUNTER — Telehealth (INDEPENDENT_AMBULATORY_CARE_PROVIDER_SITE_OTHER): Payer: Self-pay

## 2017-05-30 NOTE — Telephone Encounter (Signed)
Juliann Pulse from [facility] called to let us know she went to the pt's home for a house visit and the pt refused service. Juliann Pulse can be contacted at [phone].

## 2017-05-30 NOTE — Telephone Encounter (Signed)
FYI--[facility] called stating patient refused HHPT today

## 2017-05-31 NOTE — Telephone Encounter (Signed)
FYI

## 2017-05-31 NOTE — Telephone Encounter (Signed)
No further action warranted. Appreciate update.   Hospital follow-up scheduled for 06/06/17.

## 2017-06-02 DIAGNOSIS — S72145D Nondisplaced intertrochanteric fracture of left femur, subsequent encounter for closed fracture with routine healing: Secondary | ICD-10-CM | POA: Diagnosis not present

## 2017-06-02 DIAGNOSIS — I10 Essential (primary) hypertension: Secondary | ICD-10-CM | POA: Diagnosis not present

## 2017-06-02 DIAGNOSIS — Z9181 History of falling: Secondary | ICD-10-CM | POA: Diagnosis not present

## 2017-06-02 DIAGNOSIS — M6281 Muscle weakness (generalized): Secondary | ICD-10-CM | POA: Diagnosis not present

## 2017-06-02 DIAGNOSIS — R0602 Shortness of breath: Secondary | ICD-10-CM | POA: Diagnosis not present

## 2017-06-02 NOTE — Telephone Encounter (Signed)
FYI patient refusing HHPT

## 2017-06-03 ENCOUNTER — Encounter: Payer: Medicare Other | Admitting: Family Medicine

## 2017-06-03 DIAGNOSIS — R0602 Shortness of breath: Secondary | ICD-10-CM | POA: Diagnosis not present

## 2017-06-03 DIAGNOSIS — Z9181 History of falling: Secondary | ICD-10-CM | POA: Diagnosis not present

## 2017-06-03 DIAGNOSIS — M6281 Muscle weakness (generalized): Secondary | ICD-10-CM | POA: Diagnosis not present

## 2017-06-03 DIAGNOSIS — I10 Essential (primary) hypertension: Secondary | ICD-10-CM | POA: Diagnosis not present

## 2017-06-03 DIAGNOSIS — S72145D Nondisplaced intertrochanteric fracture of left femur, subsequent encounter for closed fracture with routine healing: Secondary | ICD-10-CM | POA: Diagnosis not present

## 2017-06-04 DIAGNOSIS — I10 Essential (primary) hypertension: Secondary | ICD-10-CM | POA: Diagnosis not present

## 2017-06-04 DIAGNOSIS — R0602 Shortness of breath: Secondary | ICD-10-CM | POA: Diagnosis not present

## 2017-06-04 DIAGNOSIS — M6281 Muscle weakness (generalized): Secondary | ICD-10-CM | POA: Diagnosis not present

## 2017-06-04 DIAGNOSIS — Z9181 History of falling: Secondary | ICD-10-CM | POA: Diagnosis not present

## 2017-06-04 DIAGNOSIS — S72145D Nondisplaced intertrochanteric fracture of left femur, subsequent encounter for closed fracture with routine healing: Secondary | ICD-10-CM | POA: Diagnosis not present

## 2017-06-06 ENCOUNTER — Encounter: Payer: Self-pay | Admitting: Family Medicine

## 2017-06-06 ENCOUNTER — Ambulatory Visit (INDEPENDENT_AMBULATORY_CARE_PROVIDER_SITE_OTHER): Payer: Medicare Other | Admitting: Family Medicine

## 2017-06-06 VITALS — BP 124/74 | HR 97 | Temp 98.2°F | Resp 17 | Ht 67.5 in | Wt 140.0 lb

## 2017-06-06 DIAGNOSIS — I10 Essential (primary) hypertension: Secondary | ICD-10-CM

## 2017-06-06 DIAGNOSIS — W19XXXS Unspecified fall, sequela: Secondary | ICD-10-CM

## 2017-06-06 DIAGNOSIS — S72002D Fracture of unspecified part of neck of left femur, subsequent encounter for closed fracture with routine healing: Secondary | ICD-10-CM

## 2017-06-06 DIAGNOSIS — M8080XA Other osteoporosis with current pathological fracture, unspecified site, initial encounter for fracture: Secondary | ICD-10-CM

## 2017-06-06 DIAGNOSIS — L308 Other specified dermatitis: Secondary | ICD-10-CM | POA: Diagnosis not present

## 2017-06-06 DIAGNOSIS — E46 Unspecified protein-calorie malnutrition: Secondary | ICD-10-CM | POA: Diagnosis not present

## 2017-06-06 DIAGNOSIS — I493 Ventricular premature depolarization: Secondary | ICD-10-CM | POA: Diagnosis not present

## 2017-06-06 DIAGNOSIS — E7800 Pure hypercholesterolemia, unspecified: Secondary | ICD-10-CM | POA: Diagnosis not present

## 2017-06-06 LAB — POCT URINALYSIS DIP (MANUAL ENTRY)
BILIRUBIN UA: NEGATIVE
Blood, UA: NEGATIVE
GLUCOSE UA: NEGATIVE mg/dL
Ketones, POC UA: NEGATIVE mg/dL
LEUKOCYTES UA: NEGATIVE
NITRITE UA: NEGATIVE
PH UA: 6 (ref 5.0–8.0)
Spec Grav, UA: 1.02 (ref 1.010–1.025)
UROBILINOGEN UA: 0.2 U/dL

## 2017-06-06 MED ORDER — ALENDRONATE SODIUM 70 MG PO TABS: 70.0000 mg | ORAL_TABLET | ORAL | 11 refills | Status: DC

## 2017-06-06 NOTE — Progress Notes (Signed)
Subjective:    Patient ID: [name], male    DOB: Jun 13, 1932, 83 y.o.   MRN: [id number]  06/06/2017  Hospitalization Follow-up   HPI This 83 y.o. male presents for evaluation of Thomas from rehabilitation for recent LEFT intertrocharnteric hip fracture with nailing.  Discharged 1 week ago. Dr. Marlou Sa performed surgery. Discharged from [facility]  No pain.  Feels pain in knee if walks without walker.  Never felt any pain.  Had pain at hip.   Hs returned home to live; using rolling walker; hsa taken out rugs; hard wood floors are in good condition.  Social worker and physical therapist coming out for counseling.   Cooking for self.  Also blood pressure checks at home by [facility].     BP Readings from Last 3 Encounters:  06/06/17 124/74  05/29/17 (!) 141/79  04/15/17 127/82   Wt Readings from Last 3 Encounters:  06/06/17 140 lb (63.5 kg)  04/09/17 148 lb 11.2 oz (67.4 kg)  04/01/17 148 lb (67.1 kg)   Immunization History  Administered Date(s) Administered  . Influenza, Seasonal, Injecte, Preservative Fre 10/14/2012  . Influenza,inj,Quad PF,6+ Mos 12/07/2014, 08/11/2015, 09/11/2016  . Pneumococcal Conjugate-13 06/03/2014  . Pneumococcal Polysaccharide-23 04/14/2007, 09/22/2015  . Tdap 04/14/2007, 09/25/2016  . Zoster 12/06/2011    Review of Systems  Constitutional: Negative for activity change, appetite change, chills, diaphoresis, fatigue and fever.  Respiratory: Negative for cough and shortness of breath.   Cardiovascular: Negative for chest pain, palpitations and leg swelling.  Gastrointestinal: Negative for abdominal pain, diarrhea, nausea and vomiting.  Endocrine: Negative for cold intolerance, heat intolerance, polydipsia, polyphagia and polyuria.  Skin: Negative for color change, rash and wound.  Neurological: Negative for dizziness, tremors, seizures, syncope, facial asymmetry, speech difficulty, weakness, light-headedness, numbness and  headaches.  Psychiatric/Behavioral: Negative for dysphoric mood and sleep disturbance. The patient is not nervous/anxious.     Past Medical History:  Diagnosis Date  . Allergy    seasonal  fall  . Cancer ([place])    Basal cell carcinoma; multiple  . Eczema   . Hyperlipidemia   . Hypertension    Past Surgical History:  Procedure Laterality Date  . CATARACT EXTRACTION, BILATERAL  06/29/2015  . INTRAMEDULLARY (IM) NAIL INTERTROCHANTERIC Left 03/30/2017   Procedure: INTRAMEDULLARY (IM) NAIL INTERTROCHANTRIC;  Surgeon: [name], MD;  Location: [place];  Service: Orthopedics;  Laterality: Left;  . MOHS SURGERY    . TONSILLECTOMY  1940 maybe   Allergies  Allergen Reactions  . Seasonal Ic [Cholestatin]     Social History   Social History  . Marital status: Married    Spouse name: N/A  . Number of children: N/A  . Years of education: N/A   Occupational History  . retired    Social History Main Topics  . Smoking status: Never Smoker  . Smokeless tobacco: Never Used  . Alcohol use No     Comment: occas  . Drug use: No  . Sexual activity: Not Currently   Other Topics Concern  . Not on file   Social History Narrative   Marital status: divorced; good friend with ex-wife; not dating      Children: 2 children; 4 grandchildren; no gg      Lives:  Alone in house; daughter six blocks away      Employment: college professor; retired in 1997; history; [place]:  Never      Alcohol: tequila loves  but won't buy it.      Exercise:  As much as can; previous competitive biking.      ADLs: quit mowing grass in 2017; no assistant devices; cooks and cleans and pays bills.  No driving; HAS NEVER DRIVEN; has always ridden bike.  Daughter takes patient to grocery store and to appointments.        Advanced Directives:  No living; DNR; do not resuscitate.  HCPOA: [name] Cummings   Family History  Problem Relation Age of Onset  . Arthritis Mother   . Heart disease  Mother 40  . Hypertension Father        Objective:    BP 124/74   Pulse 97   Temp 98.2 F (36.8 C) (Oral)   Resp 17   Ht 5' 7.5" (1.715 m)   Wt 140 lb (63.5 kg)   SpO2 98%   BMI 21.60 kg/m  Physical Exam  Constitutional: He is oriented to person, place, and time. He appears well-developed and well-nourished. No distress.  HENT:  Head: Normocephalic and atraumatic.  Right Ear: External ear normal.  Left Ear: External ear normal.  Nose: Nose normal.  Mouth/Throat: Oropharynx is clear and moist.  Eyes: Pupils are equal, round, and reactive to light. Conjunctivae and EOM are normal.  Neck: Normal range of motion. Neck supple. Carotid bruit is not present. No thyromegaly present.  Cardiovascular: Normal rate, regular rhythm, normal heart sounds and intact distal pulses.  Exam reveals no gallop and no friction rub.   No murmur heard. Pulmonary/Chest: Effort normal and breath sounds normal. He has no wheezes. He has no rales.  Abdominal: Soft. Bowel sounds are normal. He exhibits no distension and no mass. There is no tenderness. There is no rebound and no guarding.  Lymphadenopathy:    He has no cervical adenopathy.  Neurological: He is alert and oriented to person, place, and time. No cranial nerve deficit.  Skin: Skin is warm and dry. No rash noted. He is not diaphoretic.  Psychiatric: He has a normal mood and affect. His behavior is normal.  Nursing note and vitals reviewed.   No results found. Depression screen [facility] 2/9 06/06/2017 01/29/2017 01/29/2017 09/25/2016 09/11/2016  Decreased Interest 0 0 0 0 0  Down, Depressed, Hopeless 0 0 0 0 0  PHQ - 2 Score 0 0 0 0 0   Fall Risk  06/06/2017 01/29/2017 01/29/2017 09/25/2016 09/11/2016  Falls in the past year? No Yes No Yes Yes  Comment - - - once -  Number falls in past yr: - 1 - - 1  Injury with Fall? - Yes - - No        Assessment & Plan:   1. Closed fracture of left hip with routine healing, subsequent encounter   2. Fall,  sequela   3. Other eczema   4. Benign essential HTN   5. PVC (premature ventricular contraction)   6. Pure hypercholesterolemia   7. Localized osteoporosis with current pathological fracture, initial encounter   8. Hypoalbuminemia due to protein-calorie malnutrition ([place])    -s/p admission for LEFT hip closed fracture with routine healing; doing well and living independently. -discussed dx of osteoporosis and need for bone density scan; rx for Fosamax provided.  Discussed with patient that he can no longer have frequent oral steroid for eczema management.  Continue topical steroids and follow closely with dermatology; pt expressed understanding. -obtain labs for chronic disease management.   Orders Placed This Encounter  Procedures  .  CBC with Differential/Platelet  . Comprehensive metabolic panel    Order Specific Question:   Has the patient fasted?    Answer:   Yes  . Lipid panel    Order Specific Question:   Has the patient fasted?    Answer:   Yes  . POCT urinalysis dipstick   Meds ordered this encounter  Medications  . alendronate (FOSAMAX) 70 MG tablet    Sig: Take 1 tablet (70 mg total) by mouth every 7 (seven) days. Take with a full glass of water on an empty stomach.    Dispense:  4 tablet    Refill:  11    Return in about 3 months (around 09/06/2017) for complete physical examiniation.   Kristi Elayne Guerin, M.D. Primary Care at [facility] previously [facility] [place] [address] [place], [place]  [address] [phone] phone [phone] fax Norwood Levo, M.D. Primary Care at [facility] previously [facility] [place] [address] [place], [place]  [address] [phone] phone [phone] fax

## 2017-06-06 NOTE — Patient Instructions (Addendum)
CALCIUM PLUS D (CALTRATE) ---- ONE TABLET TWICE DAILY FOR BRITTLE BONES.   Osteoporosis Osteoporosis is the thinning and loss of density in the bones. Osteoporosis makes the bones more brittle, fragile, and likely to break (fracture). Over time, osteoporosis can cause the bones to become so weak that they fracture after a simple fall. The bones most likely to fracture are the bones in the hip, wrist, and spine. What are the causes? The exact cause is not known. What increases the risk? Anyone can develop osteoporosis. You may be at greater risk if you have a family history of the condition or have poor nutrition. You may also have a higher risk if you are:  Male.  81 years old or older.  A smoker.  Not physically active.  White or Asian.  Slender.  What are the signs or symptoms? A fracture might be the first sign of the disease, especially if it results from a fall or injury that would not usually cause a bone to break. Other signs and symptoms include:  Low back and neck pain.  Stooped posture.  Height loss.  How is this diagnosed? To make a diagnosis, your health care provider may:  Take a medical history.  Perform a physical exam.  Order tests, such as: ? A bone mineral density test. ? A dual-energy X-ray absorptiometry test.  How is this treated? The goal of osteoporosis treatment is to strengthen your bones to reduce your risk of a fracture. Treatment may involve:  Making lifestyle changes, such as: ? Eating a diet rich in calcium. ? Doing weight-bearing and muscle-strengthening exercises. ? Stopping tobacco use. ? Limiting alcohol intake.  Taking medicine to slow the process of bone loss or to increase bone density.  Monitoring your levels of calcium and vitamin D.  Follow these instructions at home:  Include calcium and vitamin D in your diet. Calcium is important for bone health, and vitamin D helps the body absorb calcium.  Perform  weight-bearing and muscle-strengthening exercises as directed by your health care provider.  Do not use any tobacco products, including cigarettes, chewing tobacco, and electronic cigarettes. If you need help quitting, ask your health care provider.  Limit your alcohol intake.  Take medicines only as directed by your health care provider.  Keep all follow-up visits as directed by your health care provider. This is important.  Take precautions at home to lower your risk of falling, such as: ? Keeping rooms well lit and clutter free. ? Installing safety rails on stairs. ? Using rubber mats in the bathroom and other areas that are often wet or slippery. Get help right away if: You fall or injure yourself. This information is not intended to replace advice given to you by your health care provider. Make sure you discuss any questions you have with your health care provider. Document Released: 07/24/2005 Document Revised: 03/18/2016 Document Reviewed: 03/24/2014 Elsevier Interactive Patient Education  2017 [employer].   IF you received an x-ray today, you will receive an invoice from [facility]. Please contact [facility] [place] [facility] at [phone] with questions or concerns regarding your invoice.   IF you received labwork today, you will receive an invoice from [place]. Please contact [facility] at [phone] with questions or concerns regarding your invoice.   Our billing staff will not be able to assist you with questions regarding bills from these companies.  You will be contacted with the lab results as soon as they are available. The fastest way to get  your results is to activate your My Chart account. Instructions are located on the last page of this paperwork. If you have not heard from [place] regarding the results in 2 weeks, please contact this office.

## 2017-06-07 LAB — COMPREHENSIVE METABOLIC PANEL
ALBUMIN: 4.5 g/dL (ref 3.5–4.7)
ALT: 9 IU/L (ref 0–44)
AST: 21 IU/L (ref 0–40)
Albumin/Globulin Ratio: 1.5 (ref 1.2–2.2)
Alkaline Phosphatase: 123 IU/L — ABNORMAL HIGH (ref 39–117)
BUN / CREAT RATIO: 11 (ref 10–24)
BUN: 14 mg/dL (ref 8–27)
Bilirubin Total: 0.5 mg/dL (ref 0.0–1.2)
CALCIUM: 10.1 mg/dL (ref 8.6–10.2)
CO2: 28 mmol/L (ref 20–29)
CREATININE: 1.26 mg/dL (ref 0.76–1.27)
Chloride: 102 mmol/L (ref 96–106)
GFR calc Af Amer: 61 mL/min/{1.73_m2} (ref >59)
GFR, EST NON AFRICAN AMERICAN: 52 mL/min/{1.73_m2} — AB (ref >59)
GLOBULIN, TOTAL: 3.1 g/dL (ref 1.5–4.5)
Glucose: 89 mg/dL (ref 65–99)
Potassium: 4.6 mmol/L (ref 3.5–5.2)
SODIUM: 144 mmol/L (ref 134–144)
Total Protein: 7.6 g/dL (ref 6.0–8.5)

## 2017-06-07 LAB — LIPID PANEL
CHOL/HDL RATIO: 2.7 ratio (ref 0.0–5.0)
Cholesterol, Total: 159 mg/dL (ref 100–199)
HDL: 59 mg/dL (ref >39)
LDL Calculated: 79 mg/dL (ref 0–99)
Triglycerides: 104 mg/dL (ref 0–149)
VLDL Cholesterol Cal: 21 mg/dL (ref 5–40)

## 2017-06-07 LAB — CBC WITH DIFFERENTIAL/PLATELET
BASOS: 1 %
Basophils Absolute: 0 10*3/uL (ref 0.0–0.2)
EOS (ABSOLUTE): 0.7 10*3/uL — ABNORMAL HIGH (ref 0.0–0.4)
EOS: 9 %
HEMATOCRIT: 49 % (ref 37.5–51.0)
HEMOGLOBIN: 15.8 g/dL (ref 13.0–17.7)
IMMATURE GRANULOCYTES: 0 %
Immature Grans (Abs): 0 10*3/uL (ref 0.0–0.1)
LYMPHS ABS: 2 10*3/uL (ref 0.7–3.1)
Lymphs: 25 %
MCH: 31.4 pg (ref 26.6–33.0)
MCHC: 32.2 g/dL (ref 31.5–35.7)
MCV: 97 fL (ref 79–97)
MONOCYTES: 7 %
Monocytes Absolute: 0.6 10*3/uL (ref 0.1–0.9)
Neutrophils Absolute: 4.9 10*3/uL (ref 1.4–7.0)
Neutrophils: 58 %
Platelets: 204 10*3/uL (ref 150–379)
RBC: 5.03 x10E6/uL (ref 4.14–5.80)
RDW: 12.9 % (ref 12.3–15.4)
WBC: 8.2 10*3/uL (ref 3.4–10.8)

## 2017-06-09 DIAGNOSIS — S72145D Nondisplaced intertrochanteric fracture of left femur, subsequent encounter for closed fracture with routine healing: Secondary | ICD-10-CM | POA: Diagnosis not present

## 2017-06-09 DIAGNOSIS — Z9181 History of falling: Secondary | ICD-10-CM | POA: Diagnosis not present

## 2017-06-09 DIAGNOSIS — M6281 Muscle weakness (generalized): Secondary | ICD-10-CM | POA: Diagnosis not present

## 2017-06-09 DIAGNOSIS — R0602 Shortness of breath: Secondary | ICD-10-CM | POA: Diagnosis not present

## 2017-06-09 DIAGNOSIS — I10 Essential (primary) hypertension: Secondary | ICD-10-CM | POA: Diagnosis not present

## 2017-06-10 DIAGNOSIS — S72145D Nondisplaced intertrochanteric fracture of left femur, subsequent encounter for closed fracture with routine healing: Secondary | ICD-10-CM | POA: Diagnosis not present

## 2017-06-10 DIAGNOSIS — R0602 Shortness of breath: Secondary | ICD-10-CM | POA: Diagnosis not present

## 2017-06-10 DIAGNOSIS — I10 Essential (primary) hypertension: Secondary | ICD-10-CM | POA: Diagnosis not present

## 2017-06-10 DIAGNOSIS — M6281 Muscle weakness (generalized): Secondary | ICD-10-CM | POA: Diagnosis not present

## 2017-06-10 DIAGNOSIS — Z9181 History of falling: Secondary | ICD-10-CM | POA: Diagnosis not present

## 2017-06-11 ENCOUNTER — Telehealth (INDEPENDENT_AMBULATORY_CARE_PROVIDER_SITE_OTHER): Payer: Self-pay | Admitting: Orthopedic Surgery

## 2017-06-11 DIAGNOSIS — I10 Essential (primary) hypertension: Secondary | ICD-10-CM | POA: Diagnosis not present

## 2017-06-11 DIAGNOSIS — S72145D Nondisplaced intertrochanteric fracture of left femur, subsequent encounter for closed fracture with routine healing: Secondary | ICD-10-CM | POA: Diagnosis not present

## 2017-06-11 DIAGNOSIS — R0602 Shortness of breath: Secondary | ICD-10-CM | POA: Diagnosis not present

## 2017-06-11 DIAGNOSIS — Z9181 History of falling: Secondary | ICD-10-CM | POA: Diagnosis not present

## 2017-06-11 DIAGNOSIS — M6281 Muscle weakness (generalized): Secondary | ICD-10-CM | POA: Diagnosis not present

## 2017-06-11 NOTE — Telephone Encounter (Signed)
IC LM with ok for continuation.

## 2017-06-11 NOTE — Telephone Encounter (Signed)
Almira called from [facility] asking for a verbal order to continue PT for twice a week for 3 weeks. CB # [phone]

## 2017-06-16 DIAGNOSIS — Z9181 History of falling: Secondary | ICD-10-CM | POA: Diagnosis not present

## 2017-06-16 DIAGNOSIS — S72145D Nondisplaced intertrochanteric fracture of left femur, subsequent encounter for closed fracture with routine healing: Secondary | ICD-10-CM | POA: Diagnosis not present

## 2017-06-16 DIAGNOSIS — I10 Essential (primary) hypertension: Secondary | ICD-10-CM | POA: Diagnosis not present

## 2017-06-16 DIAGNOSIS — R0602 Shortness of breath: Secondary | ICD-10-CM | POA: Diagnosis not present

## 2017-06-16 DIAGNOSIS — M6281 Muscle weakness (generalized): Secondary | ICD-10-CM | POA: Diagnosis not present

## 2017-06-18 DIAGNOSIS — M6281 Muscle weakness (generalized): Secondary | ICD-10-CM | POA: Diagnosis not present

## 2017-06-18 DIAGNOSIS — I10 Essential (primary) hypertension: Secondary | ICD-10-CM | POA: Diagnosis not present

## 2017-06-18 DIAGNOSIS — S72145D Nondisplaced intertrochanteric fracture of left femur, subsequent encounter for closed fracture with routine healing: Secondary | ICD-10-CM | POA: Diagnosis not present

## 2017-06-18 DIAGNOSIS — Z9181 History of falling: Secondary | ICD-10-CM | POA: Diagnosis not present

## 2017-06-18 DIAGNOSIS — R0602 Shortness of breath: Secondary | ICD-10-CM | POA: Diagnosis not present

## 2017-06-20 ENCOUNTER — Other Ambulatory Visit: Payer: Self-pay | Admitting: Family Medicine

## 2017-06-20 DIAGNOSIS — I10 Essential (primary) hypertension: Secondary | ICD-10-CM

## 2017-06-20 DIAGNOSIS — R000 Tachycardia, unspecified: Secondary | ICD-10-CM

## 2017-06-23 ENCOUNTER — Encounter: Payer: Self-pay | Admitting: Family Medicine

## 2017-06-25 DIAGNOSIS — R0602 Shortness of breath: Secondary | ICD-10-CM | POA: Diagnosis not present

## 2017-06-25 DIAGNOSIS — I10 Essential (primary) hypertension: Secondary | ICD-10-CM | POA: Diagnosis not present

## 2017-06-25 DIAGNOSIS — S72145D Nondisplaced intertrochanteric fracture of left femur, subsequent encounter for closed fracture with routine healing: Secondary | ICD-10-CM | POA: Diagnosis not present

## 2017-06-25 DIAGNOSIS — M6281 Muscle weakness (generalized): Secondary | ICD-10-CM | POA: Diagnosis not present

## 2017-06-25 DIAGNOSIS — Z9181 History of falling: Secondary | ICD-10-CM | POA: Diagnosis not present

## 2017-07-04 DIAGNOSIS — I10 Essential (primary) hypertension: Secondary | ICD-10-CM | POA: Diagnosis not present

## 2017-07-04 DIAGNOSIS — S72145D Nondisplaced intertrochanteric fracture of left femur, subsequent encounter for closed fracture with routine healing: Secondary | ICD-10-CM | POA: Diagnosis not present

## 2017-07-04 DIAGNOSIS — R0602 Shortness of breath: Secondary | ICD-10-CM | POA: Diagnosis not present

## 2017-07-04 DIAGNOSIS — M6281 Muscle weakness (generalized): Secondary | ICD-10-CM | POA: Diagnosis not present

## 2017-07-04 DIAGNOSIS — Z9181 History of falling: Secondary | ICD-10-CM | POA: Diagnosis not present

## 2017-07-08 DIAGNOSIS — M6281 Muscle weakness (generalized): Secondary | ICD-10-CM | POA: Diagnosis not present

## 2017-07-08 DIAGNOSIS — Z9181 History of falling: Secondary | ICD-10-CM | POA: Diagnosis not present

## 2017-07-08 DIAGNOSIS — I10 Essential (primary) hypertension: Secondary | ICD-10-CM | POA: Diagnosis not present

## 2017-07-08 DIAGNOSIS — S72145D Nondisplaced intertrochanteric fracture of left femur, subsequent encounter for closed fracture with routine healing: Secondary | ICD-10-CM | POA: Diagnosis not present

## 2017-07-11 DIAGNOSIS — M6281 Muscle weakness (generalized): Secondary | ICD-10-CM | POA: Diagnosis not present

## 2017-07-11 DIAGNOSIS — I10 Essential (primary) hypertension: Secondary | ICD-10-CM | POA: Diagnosis not present

## 2017-07-11 DIAGNOSIS — S72145D Nondisplaced intertrochanteric fracture of left femur, subsequent encounter for closed fracture with routine healing: Secondary | ICD-10-CM | POA: Diagnosis not present

## 2017-07-11 DIAGNOSIS — Z9181 History of falling: Secondary | ICD-10-CM | POA: Diagnosis not present

## 2017-07-16 DIAGNOSIS — Z9181 History of falling: Secondary | ICD-10-CM | POA: Diagnosis not present

## 2017-07-16 DIAGNOSIS — I10 Essential (primary) hypertension: Secondary | ICD-10-CM | POA: Diagnosis not present

## 2017-07-16 DIAGNOSIS — S72145D Nondisplaced intertrochanteric fracture of left femur, subsequent encounter for closed fracture with routine healing: Secondary | ICD-10-CM | POA: Diagnosis not present

## 2017-07-16 DIAGNOSIS — M6281 Muscle weakness (generalized): Secondary | ICD-10-CM | POA: Diagnosis not present

## 2017-07-18 DIAGNOSIS — Z9181 History of falling: Secondary | ICD-10-CM | POA: Diagnosis not present

## 2017-07-18 DIAGNOSIS — I10 Essential (primary) hypertension: Secondary | ICD-10-CM | POA: Diagnosis not present

## 2017-07-18 DIAGNOSIS — M6281 Muscle weakness (generalized): Secondary | ICD-10-CM | POA: Diagnosis not present

## 2017-07-18 DIAGNOSIS — S72145D Nondisplaced intertrochanteric fracture of left femur, subsequent encounter for closed fracture with routine healing: Secondary | ICD-10-CM | POA: Diagnosis not present

## 2017-07-21 DIAGNOSIS — Z9181 History of falling: Secondary | ICD-10-CM | POA: Diagnosis not present

## 2017-07-21 DIAGNOSIS — I10 Essential (primary) hypertension: Secondary | ICD-10-CM | POA: Diagnosis not present

## 2017-07-21 DIAGNOSIS — S72145D Nondisplaced intertrochanteric fracture of left femur, subsequent encounter for closed fracture with routine healing: Secondary | ICD-10-CM | POA: Diagnosis not present

## 2017-07-21 DIAGNOSIS — M6281 Muscle weakness (generalized): Secondary | ICD-10-CM | POA: Diagnosis not present

## 2017-07-23 DIAGNOSIS — I10 Essential (primary) hypertension: Secondary | ICD-10-CM | POA: Diagnosis not present

## 2017-07-23 DIAGNOSIS — M6281 Muscle weakness (generalized): Secondary | ICD-10-CM | POA: Diagnosis not present

## 2017-07-23 DIAGNOSIS — S72145D Nondisplaced intertrochanteric fracture of left femur, subsequent encounter for closed fracture with routine healing: Secondary | ICD-10-CM | POA: Diagnosis not present

## 2017-07-23 DIAGNOSIS — Z9181 History of falling: Secondary | ICD-10-CM | POA: Diagnosis not present

## 2017-07-28 DIAGNOSIS — Z9181 History of falling: Secondary | ICD-10-CM | POA: Diagnosis not present

## 2017-07-28 DIAGNOSIS — I10 Essential (primary) hypertension: Secondary | ICD-10-CM | POA: Diagnosis not present

## 2017-07-28 DIAGNOSIS — S72145D Nondisplaced intertrochanteric fracture of left femur, subsequent encounter for closed fracture with routine healing: Secondary | ICD-10-CM | POA: Diagnosis not present

## 2017-07-28 DIAGNOSIS — M6281 Muscle weakness (generalized): Secondary | ICD-10-CM | POA: Diagnosis not present

## 2017-08-06 DIAGNOSIS — I10 Essential (primary) hypertension: Secondary | ICD-10-CM | POA: Diagnosis not present

## 2017-08-06 DIAGNOSIS — S72145D Nondisplaced intertrochanteric fracture of left femur, subsequent encounter for closed fracture with routine healing: Secondary | ICD-10-CM | POA: Diagnosis not present

## 2017-08-06 DIAGNOSIS — M6281 Muscle weakness (generalized): Secondary | ICD-10-CM | POA: Diagnosis not present

## 2017-08-06 DIAGNOSIS — Z9181 History of falling: Secondary | ICD-10-CM | POA: Diagnosis not present

## 2017-08-09 ENCOUNTER — Other Ambulatory Visit: Payer: Self-pay | Admitting: Family Medicine

## 2017-08-11 ENCOUNTER — Other Ambulatory Visit: Payer: Self-pay | Admitting: Family Medicine

## 2017-08-11 NOTE — Telephone Encounter (Signed)
Call patient --- I did not approve refill of Lisinopril as Lisinopril was discontinued during hospitalization.

## 2017-08-14 NOTE — Telephone Encounter (Signed)
Called and informed pt to stop taking Lisinopril until next office visit.

## 2017-08-25 ENCOUNTER — Ambulatory Visit (INDEPENDENT_AMBULATORY_CARE_PROVIDER_SITE_OTHER): Payer: Medicare Other

## 2017-08-25 VITALS — BP 126/80 | HR 72 | Temp 97.6°F | Ht 68.0 in | Wt 143.5 lb

## 2017-08-25 DIAGNOSIS — Z23 Encounter for immunization: Secondary | ICD-10-CM | POA: Diagnosis not present

## 2017-08-25 DIAGNOSIS — Z0000 Encounter for general adult medical examination without abnormal findings: Secondary | ICD-10-CM

## 2017-08-25 NOTE — Patient Instructions (Addendum)
[name] , Thank you for taking time to come for your Medicare Wellness Visit. I appreciate your ongoing commitment to your health goals. Please review the following plan we discussed and let me know if I can assist you in the future.   Screening recommendations/referrals: Colonoscopy: stops after age 81 Recommended yearly ophthalmology/optometry visit for glaucoma screening and checkup Recommended yearly dental visit for hygiene and checkup  Vaccinations: Influenza vaccine: administered today  Pneumococcal vaccine: up to date Tdap vaccine: up to date, next due 09/25/2026 Shingles vaccine: up to date    Advanced directives: Advance directive discussed with you today. Even though you declined this today please call our office should you change your mind and we can give you the proper paperwork for you to fill out.  Conditions/risks identified: Try to start back exercising daily. (strength training)  Next appointment: 09/10/17 @ 2:40 pm with Dr. Tamala Julian   Preventive Care 65 Years and Older, Male Preventive care refers to lifestyle choices and visits with your health care provider that can promote health and wellness. What does preventive care include?  A yearly physical exam. This is also called an annual well check.  Dental exams once or twice a year.  Routine eye exams. Ask your health care provider how often you should have your eyes checked.  Personal lifestyle choices, including:  Daily care of your teeth and gums.  Regular physical activity.  Eating a healthy diet.  Avoiding tobacco and drug use.  Limiting alcohol use.  Practicing safe sex.  Taking low doses of aspirin every day.  Taking vitamin and mineral supplements as recommended by your health care provider. What happens during an annual well check? The services and screenings done by your health care provider during your annual well check will depend on your age, overall health, lifestyle risk factors, and  family history of disease. Counseling  Your health care provider may ask you questions about your:  Alcohol use.  Tobacco use.  Drug use.  Emotional well-being.  Home and relationship well-being.  Sexual activity.  Eating habits.  History of falls.  Memory and ability to understand (cognition).  Work and work Statistician. Screening  You may have the following tests or measurements:  Height, weight, and BMI.  Blood pressure.  Lipid and cholesterol levels. These may be checked every 5 years, or more frequently if you are over 67 years old.  Skin check.  Lung cancer screening. You may have this screening every year starting at age 58 if you have a 30-pack-year history of smoking and currently smoke or have quit within the past 15 years.  Fecal occult blood test (FOBT) of the stool. You may have this test every year starting at age 57.  Flexible sigmoidoscopy or colonoscopy. You may have a sigmoidoscopy every 5 years or a colonoscopy every 10 years starting at age 24.  Prostate cancer screening. Recommendations will vary depending on your family history and other risks.  Hepatitis C blood test.  Hepatitis B blood test.  Sexually transmitted disease (STD) testing.  Diabetes screening. This is done by checking your blood sugar (glucose) after you have not eaten for a while (fasting). You may have this done every 1-3 years.  Abdominal aortic aneurysm (AAA) screening. You may need this if you are a current or former smoker.  Osteoporosis. You may be screened starting at age 85 if you are at high risk. Talk with your health care provider about your test results, treatment options, and if necessary,  the need for more tests. Vaccines  Your health care provider may recommend certain vaccines, such as:  Influenza vaccine. This is recommended every year.  Tetanus, diphtheria, and acellular pertussis (Tdap, Td) vaccine. You may need a Td booster every 10 years.  Zoster  vaccine. You may need this after age 85.  Pneumococcal 13-valent conjugate (PCV13) vaccine. One dose is recommended after age 48.  Pneumococcal polysaccharide (PPSV23) vaccine. One dose is recommended after age 83. Talk to your health care provider about which screenings and vaccines you need and how often you need them. This information is not intended to replace advice given to you by your health care provider. Make sure you discuss any questions you have with your health care provider. Document Released: 11/10/2015 Document Revised: 07/03/2016 Document Reviewed: 08/15/2015 Elsevier Interactive Patient Education  2017 [employer] Prevention in the Home Falls can cause injuries. They can happen to people of all ages. There are many things you can do to make your home safe and to help prevent falls. What can I do on the outside of my home?  Regularly fix the edges of walkways and driveways and fix any cracks.  Remove anything that might make you trip as you walk through a door, such as a raised step or threshold.  Trim any bushes or trees on the path to your home.  Use bright outdoor lighting.  Clear any walking paths of anything that might make someone trip, such as rocks or tools.  Regularly check to see if handrails are loose or broken. Make sure that both sides of any steps have handrails.  Any raised decks and porches should have guardrails on the edges.  Have any leaves, snow, or ice cleared regularly.  Use sand or salt on walking paths during winter.  Clean up any spills in your garage right away. This includes oil or grease spills. What can I do in the bathroom?  Use night lights.  Install grab bars by the toilet and in the tub and shower. Do not use towel bars as grab bars.  Use non-skid mats or decals in the tub or shower.  If you need to sit down in the shower, use a plastic, non-slip stool.  Keep the floor dry. Clean up any water that spills on the  floor as soon as it happens.  Remove soap buildup in the tub or shower regularly.  Attach bath mats securely with double-sided non-slip rug tape.  Do not have throw rugs and other things on the floor that can make you trip. What can I do in the bedroom?  Use night lights.  Make sure that you have a light by your bed that is easy to reach.  Do not use any sheets or blankets that are too big for your bed. They should not hang down onto the floor.  Have a firm chair that has side arms. You can use this for support while you get dressed.  Do not have throw rugs and other things on the floor that can make you trip. What can I do in the kitchen?  Clean up any spills right away.  Avoid walking on wet floors.  Keep items that you use a lot in easy-to-reach places.  If you need to reach something above you, use a strong step stool that has a grab bar.  Keep electrical cords out of the way.  Do not use floor polish or wax that makes floors slippery. If you must use  wax, use non-skid floor wax.  Do not have throw rugs and other things on the floor that can make you trip. What can I do with my stairs?  Do not leave any items on the stairs.  Make sure that there are handrails on both sides of the stairs and use them. Fix handrails that are broken or loose. Make sure that handrails are as long as the stairways.  Check any carpeting to make sure that it is firmly attached to the stairs. Fix any carpet that is loose or worn.  Avoid having throw rugs at the top or bottom of the stairs. If you do have throw rugs, attach them to the floor with carpet tape.  Make sure that you have a light switch at the top of the stairs and the bottom of the stairs. If you do not have them, ask someone to add them for you. What else can I do to help prevent falls?  Wear shoes that:  Do not have high heels.  Have rubber bottoms.  Are comfortable and fit you well.  Are closed at the toe. Do not wear  sandals.  If you use a stepladder:  Make sure that it is fully opened. Do not climb a closed stepladder.  Make sure that both sides of the stepladder are locked into place.  Ask someone to hold it for you, if possible.  Clearly mark and make sure that you can see:  Any grab bars or handrails.  First and last steps.  Where the edge of each step is.  Use tools that help you move around (mobility aids) if they are needed. These include:  Canes.  Walkers.  Scooters.  Crutches.  Turn on the lights when you go into a dark area. Replace any light bulbs as soon as they burn out.  Set up your furniture so you have a clear path. Avoid moving your furniture around.  If any of your floors are uneven, fix them.  If there are any pets around you, be aware of where they are.  Review your medicines with your doctor. Some medicines can make you feel dizzy. This can increase your chance of falling. Ask your doctor what other things that you can do to help prevent falls. This information is not intended to replace advice given to you by your health care provider. Make sure you discuss any questions you have with your health care provider. Document Released: 08/10/2009 Document Revised: 03/21/2016 Document Reviewed: 11/18/2014 Elsevier Interactive Patient Education  2017 [employer].

## 2017-08-25 NOTE — Progress Notes (Signed)
Subjective:   [name] is a 83 y.o. male who presents for Medicare Annual/Subsequent preventive examination.  Review of Systems:  N/A Cardiac Risk Factors include: advanced age (>85men, >67 women);hypertension;dyslipidemia;male gender     Objective:    Vitals: BP 126/80   Pulse 72   Temp 97.6 F (36.4 C) (Oral)   Ht 5\' 8"  (1.727 m)   Wt 143 lb 8 oz (65.1 kg)   BMI 21.82 kg/m   Body mass index is 21.82 kg/m.  Tobacco History  Smoking Status  . Never Smoker  Smokeless Tobacco  . Never Used     Counseling given: Not Answered   Past Medical History:  Diagnosis Date  . Allergy    seasonal  fall  . Cancer ([place])    Basal cell carcinoma; multiple  . Eczema   . Hyperlipidemia   . Hypertension   . Osteoporosis    Past Surgical History:  Procedure Laterality Date  . CATARACT EXTRACTION, BILATERAL  06/29/2015  . INTRAMEDULLARY (IM) NAIL INTERTROCHANTERIC Left 03/30/2017   Procedure: INTRAMEDULLARY (IM) NAIL INTERTROCHANTRIC;  Surgeon: [name], MD;  Location: [facility];  Service: Orthopedics;  Laterality: Left;  . MOHS SURGERY    . TONSILLECTOMY  1940 maybe   Family History  Problem Relation Age of Onset  . Arthritis Mother   . Heart disease Mother 23  . Hypertension Father    History  Sexual Activity  . Sexual activity: Not Currently    Outpatient Encounter Prescriptions as of 08/25/2017  Medication Sig  . alendronate (FOSAMAX) 70 MG tablet Take 1 tablet (70 mg total) by mouth every 7 (seven) days. Take with a full glass of water on an empty stomach.  Marland Kitchen lisinopril (PRINIVIL,ZESTRIL) 5 MG tablet TAKE 1 TABLET DAILY.  . metoprolol succinate (TOPROL-XL) 50 MG 24 hr tablet TAKE 1 TABLET DAILY.  . pravastatin (PRAVACHOL) 40 MG tablet TAKE 1 TABLET DAILY.  Marland Kitchen PREDNISONE PO Take by mouth as needed. prn   No facility-administered encounter medications on file as of 08/25/2017.     Activities of Daily Living In your present state of health, do you have any  difficulty performing the following activities: 08/25/2017 06/06/2017  Hearing? N N  Vision? N N  Difficulty concentrating or making decisions? N Y  Walking or climbing stairs? N Y  Dressing or bathing? N N  Doing errands, shopping? N Y  Conservation officer, nature and eating ? N -  Using the Toilet? N -  In the past six months, have you accidently leaked urine? N -  Do you have problems with loss of bowel control? N -  Managing your Medications? N -  Managing your Finances? N -  Housekeeping or managing your Housekeeping? N -  Some recent data might be hidden    Patient Care Team: Wardell Honour, MD as PCP - General (Family Medicine)   Assessment:     Exercise Activities and Dietary recommendations Current Exercise Habits: The patient does not participate in regular exercise at present, Exercise limited by: None identified  Goals    . Exercise 3x per week (30 min per time)          Patient wants to try to start back exercising daily. (strength training)      Fall Risk Fall Risk  08/25/2017 06/06/2017 01/29/2017 01/29/2017 09/25/2016  Falls in the past year? Yes No Yes No Yes  Comment - - - - once  Number falls in past yr: 1 - 1 - -  Injury with Fall? Yes - Yes - -  Comment Broke his leg  - - - -  Risk Factor Category  High Fall Risk - - - -  Risk for fall due to : Impaired balance/gait - - - -  Follow up Falls prevention discussed - - - -   Depression Screen PHQ 2/9 Scores 08/25/2017 06/06/2017 01/29/2017 01/29/2017  PHQ - 2 Score 0 0 0 0    Cognitive Function MMSE - Mini Mental State Exam 06/11/2016  Orientation to time 5  Orientation to Place 5  Registration 3  Attention/ Calculation 5  Recall 3  Language- name 2 objects 2  Language- repeat 1  Language- follow 3 step command 3  Language- read & follow direction 1  Write a sentence 1  Copy design 1  Total score 30     6CIT Screen 08/25/2017  What Year? 0 points  What month? 0 points  What time? 0 points  Count back from  20 0 points  Months in reverse 2 points  Repeat phrase 0 points  Total Score 2    Immunization History  Administered Date(s) Administered  . Influenza, Seasonal, Injecte, Preservative Fre 10/14/2012  . Influenza,inj,Quad PF,6+ Mos 12/07/2014, 08/11/2015, 09/11/2016, 08/25/2017  . Pneumococcal Conjugate-13 06/03/2014  . Pneumococcal Polysaccharide-23 04/14/2007, 09/22/2015  . Tdap 04/14/2007, 09/25/2016  . Zoster 12/06/2011   Screening Tests Health Maintenance  Topic Date Due  . INFLUENZA VACCINE  05/28/2017  . TETANUS/TDAP  09/25/2026  . PNA vac Low Risk Adult  Completed      Plan:   I have personally reviewed and noted the following in the patient's chart:   . Medical and social history . Use of alcohol, tobacco or illicit drugs  . Current medications and supplements . Functional ability and status . Nutritional status . Physical activity . Advanced directives . List of other physicians . Hospitalizations, surgeries, and ER visits in previous 12 months . Vitals . Screenings to include cognitive, depression, and falls . Referrals and appointments  In addition, I have reviewed and discussed with patient certain preventive protocols, quality metrics, and best practice recommendations. A written personalized care plan for preventive services as well as general preventive health recommendations were provided to patient.     Khaleb, Broz, LPN  [id number]

## 2017-08-29 ENCOUNTER — Encounter: Payer: Self-pay | Admitting: Podiatry

## 2017-08-29 ENCOUNTER — Ambulatory Visit (INDEPENDENT_AMBULATORY_CARE_PROVIDER_SITE_OTHER): Payer: Medicare Other | Admitting: Podiatry

## 2017-08-29 DIAGNOSIS — B351 Tinea unguium: Secondary | ICD-10-CM

## 2017-08-29 DIAGNOSIS — M79676 Pain in unspecified toe(s): Secondary | ICD-10-CM

## 2017-08-29 NOTE — Progress Notes (Signed)
Complaint:  Visit Type: Patient returns to my office for continued preventative foot care services. Complaint: Patient states" my nails have grown long and thick and become painful to walk and wear shoes" Patient has been diagnosed with DM with no foot complications. The patient presents for preventative foot care services. No changes to ROS  Podiatric Exam: Vascular: dorsalis pedis and posterior tibial pulses are palpable bilateral. Capillary return is immediate. Temperature gradient is WNL. Skin turgor WNL  Sensorium: Normal Semmes Weinstein monofilament test. Normal tactile sensation bilaterally. Nail Exam: Pt has thick disfigured discolored nails with subungual debris noted bilateral entire nail hallux through fifth toenails Ulcer Exam: There is no evidence of ulcer or pre-ulcerative changes or infection. Orthopedic Exam: Muscle tone and strength are WNL. No limitations in general ROM. No crepitus or effusions noted. Foot type and digits show no abnormalities. Bony prominences are unremarkable. Skin: No Porokeratosis. No infection or ulcers.  Eczema noted skin both feet.  Diagnosis:  Onychomycosis, , Pain in right toe, pain in left toes  Treatment & Plan Procedures and Treatment: Consent by patient was obtained for treatment procedures.   Debridement of mycotic and hypertrophic toenails, 1 through 5 bilateral and clearing of subungual debris. No ulceration, no infection noted. ABN signed for 2018. Return Visit-Office Procedure: Patient instructed to return to the office for a follow up visit 3 months for continued evaluation and treatment.    Gardiner Barefoot DPM

## 2017-09-10 ENCOUNTER — Other Ambulatory Visit: Payer: Self-pay

## 2017-09-10 ENCOUNTER — Encounter: Payer: Self-pay | Admitting: Family Medicine

## 2017-09-10 ENCOUNTER — Ambulatory Visit (INDEPENDENT_AMBULATORY_CARE_PROVIDER_SITE_OTHER): Payer: Medicare Other | Admitting: Family Medicine

## 2017-09-10 VITALS — BP 122/62 | HR 87 | Temp 97.6°F | Resp 16 | Ht 67.72 in | Wt 140.0 lb

## 2017-09-10 DIAGNOSIS — I10 Essential (primary) hypertension: Secondary | ICD-10-CM

## 2017-09-10 DIAGNOSIS — I493 Ventricular premature depolarization: Secondary | ICD-10-CM | POA: Diagnosis not present

## 2017-09-10 DIAGNOSIS — C44310 Basal cell carcinoma of skin of unspecified parts of face: Secondary | ICD-10-CM

## 2017-09-10 DIAGNOSIS — R000 Tachycardia, unspecified: Secondary | ICD-10-CM | POA: Diagnosis not present

## 2017-09-10 DIAGNOSIS — J4521 Mild intermittent asthma with (acute) exacerbation: Secondary | ICD-10-CM

## 2017-09-10 DIAGNOSIS — L308 Other specified dermatitis: Secondary | ICD-10-CM | POA: Diagnosis not present

## 2017-09-10 LAB — POCT URINALYSIS DIP (MANUAL ENTRY)
Bilirubin, UA: NEGATIVE
Blood, UA: NEGATIVE
GLUCOSE UA: NEGATIVE mg/dL
Ketones, POC UA: NEGATIVE mg/dL
LEUKOCYTES UA: NEGATIVE
Nitrite, UA: NEGATIVE
PROTEIN UA: NEGATIVE mg/dL
Spec Grav, UA: 1.01 (ref 1.010–1.025)
UROBILINOGEN UA: 0.2 U/dL
pH, UA: 5.5 (ref 5.0–8.0)

## 2017-09-10 MED ORDER — CLOBETASOL PROPIONATE 0.05 % EX OINT: 1.0000 "application " | TOPICAL_OINTMENT | Freq: Two times a day (BID) | CUTANEOUS | 99 refills | Status: DC

## 2017-09-10 MED ORDER — METOPROLOL SUCCINATE ER 50 MG PO TB24: 50.0000 mg | ORAL_TABLET | Freq: Every day | ORAL | 1 refills | Status: DC

## 2017-09-10 MED ORDER — ZOSTER VAC RECOMB ADJUVANTED 50 MCG/0.5ML IM SUSR: 0.5000 mL | Freq: Once | INTRAMUSCULAR | 1 refills | Status: AC

## 2017-09-10 MED ORDER — PRAVASTATIN SODIUM 40 MG PO TABS: 40.0000 mg | ORAL_TABLET | Freq: Every day | ORAL | 1 refills | Status: DC

## 2017-09-10 MED ORDER — FUROSEMIDE 20 MG PO TABS: 20.0000 mg | ORAL_TABLET | Freq: Every day | ORAL | 1 refills | Status: DC | PRN

## 2017-09-10 MED ORDER — LISINOPRIL 5 MG PO TABS: 5.0000 mg | ORAL_TABLET | Freq: Every day | ORAL | 1 refills | Status: DC

## 2017-09-10 NOTE — Progress Notes (Signed)
Subjective:    Patient ID: [name], male    DOB: June 08, 1932, 83 y.o.   MRN: [id number]  09/10/2017  Annual Exam    HPI This 83 y.o. male presents for three month follow-up of hypertension, hypercholesterolemia, osteoporosis, eczema.  Has not taken any Prednisone.  Feels great.  Morale is good.    Takes Fosamax on Fridays with glass of water on empty stomach.   Recurrent Lasix; has been taking Lasix four days ago.  Much improved.  Taking Lisinopril, Pravastatin, Metoprolol. Lasix PRN.   Not checking BP at home.  Physical therapy taking weekly; normal BP. Released from physical therapy. Dermatologist none any longer.  Does not want to go anywhere else.     BP Readings from Last 3 Encounters:  09/10/17 122/62  08/25/17 126/80  06/06/17 124/74   Wt Readings from Last 3 Encounters:  09/10/17 140 lb (63.5 kg)  08/25/17 143 lb 8 oz (65.1 kg)  06/06/17 140 lb (63.5 kg)   Immunization History  Administered Date(s) Administered  . Influenza, Seasonal, Injecte, Preservative Fre 10/14/2012  . Influenza,inj,Quad PF,6+ Mos 12/07/2014, 08/11/2015, 09/11/2016, 08/25/2017  . Pneumococcal Conjugate-13 06/03/2014  . Pneumococcal Polysaccharide-23 04/14/2007, 09/22/2015  . Tdap 04/14/2007, 09/25/2016  . Zoster 12/06/2011    Review of Systems  Constitutional: Negative for activity change, appetite change, chills, diaphoresis, fatigue and fever.  Respiratory: Negative for cough and shortness of breath.   Cardiovascular: Positive for leg swelling. Negative for chest pain and palpitations.  Gastrointestinal: Negative for abdominal pain, diarrhea, nausea and vomiting.  Endocrine: Negative for cold intolerance, heat intolerance, polydipsia, polyphagia and polyuria.  Skin: Positive for rash. Negative for color change and wound.  Neurological: Negative for dizziness, tremors, seizures, syncope, facial asymmetry, speech difficulty, weakness, light-headedness, numbness and headaches.    Psychiatric/Behavioral: Negative for dysphoric mood and sleep disturbance. The patient is not nervous/anxious.     Past Medical History:  Diagnosis Date  . Allergy    seasonal  fall  . Cancer ([place])    Basal cell carcinoma; multiple  . Eczema   . Hyperlipidemia   . Hypertension   . Osteoporosis    Past Surgical History:  Procedure Laterality Date  . CATARACT EXTRACTION, BILATERAL  06/29/2015  . MOHS SURGERY    . TONSILLECTOMY  1940 maybe   Allergies  Allergen Reactions  . Seasonal Ic [Cholestatin]    Current Outpatient Medications on File Prior to Visit  Medication Sig Dispense Refill  . alendronate (FOSAMAX) 70 MG tablet Take 1 tablet (70 mg total) by mouth every 7 (seven) days. Take with a full glass of water on an empty stomach. 4 tablet 11  . PREDNISONE PO Take by mouth as needed. prn     No current facility-administered medications on file prior to visit.    Social History   Socioeconomic History  . Marital status: Married    Spouse name: Not on file  . Number of children: Not on file  . Years of education: Not on file  . Highest education level: Not on file  Social Needs  . Financial resource strain: Not on file  . Food insecurity - worry: Not on file  . Food insecurity - inability: Not on file  . Transportation needs - medical: Not on file  . Transportation needs - non-medical: Not on file  Occupational History  . Occupation: retired  Tobacco Use  . Smoking status: Never Smoker  . Smokeless tobacco: Never Used  Substance and Sexual Activity  .  Alcohol use: No    Comment: occas  . Drug use: No  . Sexual activity: Not Currently  Other Topics Concern  . Not on file  Social History Narrative   Marital status: divorced; good friend with ex-wife; not dating      Children: 2 children; 4 grandchildren; no gg      Lives:  Alone in house; daughter six blocks away      Employment: college professor; retired in 1997; history; [place]:   Never      Alcohol: tequila loves but won't buy it.      Exercise:  As much as can; previous competitive biking.      ADLs: quit mowing grass in 2017; no assistant devices; cooks and cleans and pays bills.  No driving; HAS NEVER DRIVEN; has always ridden bike.  Daughter takes patient to grocery store and to appointments.  Daughter drives patient to grocery store.      Advanced Directives:  No living; DNR; do not resuscitate.  HCPOA: [name] Cummings   Family History  Problem Relation Age of Onset  . Arthritis Mother   . Heart disease Mother 80  . Hypertension Father        Objective:    BP 122/62   Pulse 87   Temp 97.6 F (36.4 C) (Oral)   Resp 16   Ht 5' 7.72" (1.72 m)   Wt 140 lb (63.5 kg)   SpO2 96%   BMI 21.47 kg/m  Physical Exam  Constitutional: He is oriented to person, place, and time. He appears well-developed and well-nourished. No distress.  HENT:  Head: Normocephalic and atraumatic.  Right Ear: External ear normal.  Left Ear: External ear normal.  Nose: Nose normal.  Mouth/Throat: Oropharynx is clear and moist.  Eyes: Conjunctivae and EOM are normal. Pupils are equal, round, and reactive to light.  Neck: Normal range of motion. Neck supple. Carotid bruit is not present. No thyromegaly present.  Cardiovascular: Normal rate, regular rhythm, normal heart sounds and intact distal pulses. Exam reveals no gallop and no friction rub.  No murmur heard. Trace edema B lower extremities to lower calves B.    Pulmonary/Chest: Effort normal and breath sounds normal. He has no wheezes. He has no rales.  Abdominal: Soft. Bowel sounds are normal. He exhibits no distension and no mass. There is no tenderness. There is no rebound and no guarding.  Musculoskeletal: He exhibits edema.  Lymphadenopathy:    He has no cervical adenopathy.  Neurological: He is alert and oriented to person, place, and time. No cranial nerve deficit.  Skin: Skin is warm and dry. Rash noted. He is not  diaphoretic.  Diffuse scaling rash of all four extremities and back.  Scattered excoriations on lower extremities and upper extremities.  Psychiatric: He has a normal mood and affect. His behavior is normal.  Nursing note and vitals reviewed.  No results found. Depression screen [facility] 2/9 09/10/2017 08/25/2017 06/06/2017 01/29/2017 01/29/2017  Decreased Interest 0 0 0 0 0  Down, Depressed, Hopeless 0 0 0 0 0  PHQ - 2 Score 0 0 0 0 0   Fall Risk  09/10/2017 08/25/2017 06/06/2017 01/29/2017 01/29/2017  Falls in the past year? Yes Yes No Yes No  Comment - - - - -  Number falls in past yr: 1 1 - 1 -  Injury with Fall? Yes Yes - Yes -  Comment - Broke his leg  - - -  Risk Factor Category  - High Fall Risk - - -  Risk for fall due to : - Impaired balance/gait - - -  Follow up - Falls prevention discussed - - -        Assessment & Plan:   1. Benign essential HTN   2. PVC (premature ventricular contraction)   3. Mild intermittent asthma with acute exacerbation   4. Basal cell carcinoma of face   5. Other eczema   6. Essential hypertension   7. Tachycardia    Controlled hypertension hypercholesterolemia.  Restarted lisinopril since last visit as patient restarted lisinopril after discharge from rehabilitation for recent hip fracture.  I want to avoid hypoglycemic in this elderly male.  Patient denies dizziness or excessive fatigue.  I will continue to monitor blood pressures closely at each visit.  Goal blood pressure of 140/80 in this elderly male.  Obtain labs for chronic medical management.    Uncontrolled eczema.  Patient requesting prednisone therapy however refused in light of osteoporosis and recent hip fracture.  Educated patient again about the contraindication of prednisone for his eczema.  Prescribed a topical steroid cream.  Recommend Eucerin cream as well twice daily.  Limit showering to every other day to every third day.  Also recommend dermatology consultation however patient does not  desire specialist care.  Ongoing lower extremity edema.  Patient refuses cardiology consultation to evaluate for heart failure.  We will continue treatment with lisinopril, metoprolol, Lasix therapy.  Doing well at this time.  Orders Placed This Encounter  Procedures  . CBC with Differential/Platelet  . Comprehensive metabolic panel  . POCT urinalysis dipstick   Meds ordered this encounter  Medications  . lisinopril (PRINIVIL,ZESTRIL) 5 MG tablet    Sig: Take 1 tablet (5 mg total) daily by mouth.    Dispense:  90 tablet    Refill:  1  . metoprolol succinate (TOPROL-XL) 50 MG 24 hr tablet    Sig: Take 1 tablet (50 mg total) daily by mouth. Take with or immediately following a meal.    Dispense:  90 tablet    Refill:  1  . pravastatin (PRAVACHOL) 40 MG tablet    Sig: Take 1 tablet (40 mg total) daily by mouth.    Dispense:  90 tablet    Refill:  1  . furosemide (LASIX) 20 MG tablet    Sig: Take 1 tablet (20 mg total) daily as needed by mouth.    Dispense:  90 tablet    Refill:  1  . clobetasol ointment (TEMOVATE) 0.05 %    Sig: Apply 1 application 2 (two) times daily topically.    Dispense:  60 g    Refill:  prn  . Zoster Vaccine Adjuvanted [facility]) injection    Sig: Inject 0.5 mLs once for 1 dose into the muscle.    Dispense:  0.5 mL    Refill:  1    Return in about 6 months (around 03/10/2018) for follow-up chronic medical conditions.   Aleyah Balik Elayne Guerin, M.D. Primary [facility] at [facility] previously [facility] [address] [place], [place]  [address] [phone] phone [phone] fax

## 2017-09-10 NOTE — Patient Instructions (Addendum)
IF you received an x-ray today, you will receive an invoice from [facility]. Please contact [facility] at [phone] with questions or concerns regarding your invoice.   IF you received labwork today, you will receive an invoice from [place]. Please contact [facility] at [phone] with questions or concerns regarding your invoice.   Our billing staff will not be able to assist you with questions regarding bills from these companies.  You will be contacted with the lab results as soon as they are available. The fastest way to get your results is to activate your My Chart account. Instructions are located on the last page of this paperwork. If you have not heard from [place] regarding the results in 2 weeks, please contact this office.     Eczema Eczema, also called atopic dermatitis, is a skin disorder that causes inflammation of the skin. It causes a red rash and dry, scaly skin. The skin becomes very itchy. Eczema is generally worse during the cooler winter months and often improves with the warmth of summer. Eczema usually starts showing signs in infancy. Some children outgrow eczema, but it may last through adulthood. What are the causes? The exact cause of eczema is not known, but it appears to run in families. People with eczema often have a family history of eczema, allergies, asthma, or hay fever. Eczema is not contagious. Flare-ups of the condition may be caused by:  Contact with something you are sensitive or allergic to.  Stress.  What are the signs or symptoms?  Dry, scaly skin.  Red, itchy rash.  Itchiness. This may occur before the skin rash and may be very intense. How is this diagnosed? The diagnosis of eczema is usually made based on symptoms and medical history. How is this treated? Eczema cannot be cured, but symptoms usually can be controlled with treatment and other strategies. A treatment plan might include:  Controlling the itching and  scratching. ? Use over-the-counter antihistamines as directed for itching. This is especially useful at night when the itching tends to be worse. ? Use over-the-counter steroid creams as directed for itching. ? Avoid scratching. Scratching makes the rash and itching worse. It may also result in a skin infection (impetigo) due to a break in the skin caused by scratching.  Keeping the skin well moisturized with creams every day. This will seal in moisture and help prevent dryness. Lotions that contain alcohol and water should be avoided because they can dry the skin.  Limiting exposure to things that you are sensitive or allergic to (allergens).  Recognizing situations that cause stress.  Developing a plan to manage stress.  Follow these instructions at home:  Only take over-the-counter or prescription medicines as directed by your health care provider.  Do not use anything on the skin without checking with your health care provider.  Keep baths or showers short (5 minutes) in warm (not hot) water. Use mild cleansers for bathing. These should be unscented. You may add nonperfumed bath oil to the bath water. It is best to avoid soap and bubble bath.  Immediately after a bath or shower, when the skin is still damp, apply a moisturizing ointment to the entire body. This ointment should be a petroleum ointment. This will seal in moisture and help prevent dryness. The thicker the ointment, the better. These should be unscented.  Keep fingernails cut short. Children with eczema may need to wear soft gloves or mittens at night after applying an ointment.  Dress  in clothes made of cotton or cotton blends. Dress lightly, because heat increases itching.  A child with eczema should stay away from anyone with fever blisters or cold sores. The virus that causes fever blisters (herpes simplex) can cause a serious skin infection in children with eczema. Contact a health care provider if:  Your itching  interferes with sleep.  Your rash gets worse or is not better within 1 week after starting treatment.  You see pus or soft yellow scabs in the rash area.  You have a fever.  You have a rash flare-up after contact with someone who has fever blisters. This information is not intended to replace advice given to you by your health care provider. Make sure you discuss any questions you have with your health care provider. Document Released: 10/11/2000 Document Revised: 03/21/2016 Document Reviewed: 05/17/2013 Elsevier Interactive Patient Education  2017 [employer].

## 2017-09-11 LAB — COMPREHENSIVE METABOLIC PANEL
ALBUMIN: 5.1 g/dL — AB (ref 3.5–4.7)
ALK PHOS: 112 IU/L (ref 39–117)
ALT: 15 IU/L (ref 0–44)
AST: 28 IU/L (ref 0–40)
Albumin/Globulin Ratio: 1.5 (ref 1.2–2.2)
BUN/Creatinine Ratio: 10 (ref 10–24)
BUN: 14 mg/dL (ref 8–27)
Bilirubin Total: 0.6 mg/dL (ref 0.0–1.2)
CALCIUM: 10.2 mg/dL (ref 8.6–10.2)
CO2: 26 mmol/L (ref 20–29)
CREATININE: 1.46 mg/dL — AB (ref 0.76–1.27)
Chloride: 99 mmol/L (ref 96–106)
GFR calc Af Amer: 51 mL/min/{1.73_m2} — ABNORMAL LOW (ref >59)
GFR, EST NON AFRICAN AMERICAN: 44 mL/min/{1.73_m2} — AB (ref >59)
GLUCOSE: 93 mg/dL (ref 65–99)
Globulin, Total: 3.3 g/dL (ref 1.5–4.5)
Potassium: 4.2 mmol/L (ref 3.5–5.2)
Sodium: 146 mmol/L — ABNORMAL HIGH (ref 134–144)
Total Protein: 8.4 g/dL (ref 6.0–8.5)

## 2017-09-11 LAB — CBC WITH DIFFERENTIAL/PLATELET
BASOS ABS: 0.1 10*3/uL (ref 0.0–0.2)
Basos: 1 %
EOS (ABSOLUTE): 0.8 10*3/uL — ABNORMAL HIGH (ref 0.0–0.4)
Eos: 9 %
HEMOGLOBIN: 15.5 g/dL (ref 13.0–17.7)
Hematocrit: 45 % (ref 37.5–51.0)
IMMATURE GRANULOCYTES: 0 %
Immature Grans (Abs): 0 10*3/uL (ref 0.0–0.1)
LYMPHS ABS: 1.7 10*3/uL (ref 0.7–3.1)
Lymphs: 19 %
MCH: 32 pg (ref 26.6–33.0)
MCHC: 34.4 g/dL (ref 31.5–35.7)
MCV: 93 fL (ref 79–97)
Monocytes Absolute: 0.6 10*3/uL (ref 0.1–0.9)
Monocytes: 7 %
NEUTROS PCT: 64 %
Neutrophils Absolute: 5.8 10*3/uL (ref 1.4–7.0)
Platelets: 250 10*3/uL (ref 150–379)
RBC: 4.85 x10E6/uL (ref 4.14–5.80)
RDW: 14.3 % (ref 12.3–15.4)
WBC: 9 10*3/uL (ref 3.4–10.8)

## 2017-11-21 ENCOUNTER — Encounter: Payer: Self-pay | Admitting: Podiatry

## 2017-11-21 ENCOUNTER — Ambulatory Visit (INDEPENDENT_AMBULATORY_CARE_PROVIDER_SITE_OTHER): Payer: Medicare Other | Admitting: Podiatry

## 2017-11-21 DIAGNOSIS — M79676 Pain in unspecified toe(s): Secondary | ICD-10-CM

## 2017-11-21 DIAGNOSIS — B351 Tinea unguium: Secondary | ICD-10-CM | POA: Diagnosis not present

## 2017-11-21 DIAGNOSIS — L309 Dermatitis, unspecified: Secondary | ICD-10-CM

## 2017-11-21 MED ORDER — FLUOCINONIDE-E 0.05 % EX CREA: 1.0000 "application " | TOPICAL_CREAM | Freq: Two times a day (BID) | CUTANEOUS | 0 refills | Status: DC

## 2017-11-21 NOTE — Progress Notes (Signed)
Complaint:  Visit Type: Patient returns to my office for continued preventative foot care services. Complaint: Patient states" my nails have grown long and thick and become painful to walk and wear shoes" Patient has been diagnosed with DM with no foot complications. The patient presents for preventative foot care services. No changes to ROS.  Patient has draining eczema front right ankle and behind left ankle.  Podiatric Exam: Vascular: dorsalis pedis and posterior tibial pulses are palpable bilateral. Capillary return is immediate. Temperature gradient is WNL. Skin turgor WNL  Sensorium: Normal Semmes Weinstein monofilament test. Normal tactile sensation bilaterally. Nail Exam: Pt has thick disfigured discolored nails with subungual debris noted bilateral entire nail hallux through fifth toenails Ulcer Exam: There is no evidence of ulcer or pre-ulcerative changes or infection. Orthopedic Exam: Muscle tone and strength are WNL. No limitations in general ROM. No crepitus or effusions noted. Foot type and digits show no abnormalities. Bony prominences are unremarkable. Skin: No Porokeratosis. No infection or ulcers.  Eczema noted skin both feet.  Diagnosis:  Onychomycosis, , Pain in right toe, pain in left toes  Treatment & Plan Procedures and Treatment: Consent by patient was obtained for treatment procedures.   Debridement of mycotic and hypertrophic toenails, 1 through 5 bilateral and clearing of subungual debris. No ulceration, no infection noted. ABN signed for 2019. Prescribed lidex for usage on dorsal aspect both feet.  Told her to vaseline plantar aspect of both feet. Return Visit-Office Procedure: Patient instructed to return to the office for a follow up visit 10 weeks  for continued evaluation and treatment.    Gardiner Barefoot DPM

## 2018-01-16 ENCOUNTER — Emergency Department (HOSPITAL_COMMUNITY): Payer: Medicare Other

## 2018-01-16 ENCOUNTER — Inpatient Hospital Stay (HOSPITAL_COMMUNITY)
Admission: EM | Admit: 2018-01-16 | Discharge: 2018-01-20 | DRG: 065 | Disposition: A | Discharge status: Skilled Nursing Facility | Payer: Medicare Other | Attending: Family Medicine | Admitting: Family Medicine

## 2018-01-16 ENCOUNTER — Encounter: Payer: Self-pay | Admitting: Urgent Care

## 2018-01-16 ENCOUNTER — Ambulatory Visit (INDEPENDENT_AMBULATORY_CARE_PROVIDER_SITE_OTHER): Payer: Medicare Other | Admitting: Urgent Care

## 2018-01-16 ENCOUNTER — Other Ambulatory Visit: Payer: Self-pay

## 2018-01-16 ENCOUNTER — Encounter (HOSPITAL_COMMUNITY): Payer: Self-pay | Admitting: Nurse Practitioner

## 2018-01-16 ENCOUNTER — Ambulatory Visit: Payer: Self-pay

## 2018-01-16 VITALS — BP 100/60 | HR 73 | Ht 67.0 in | Wt 145.0 lb

## 2018-01-16 DIAGNOSIS — R2681 Unsteadiness on feet: Secondary | ICD-10-CM | POA: Diagnosis not present

## 2018-01-16 DIAGNOSIS — R488 Other symbolic dysfunctions: Secondary | ICD-10-CM | POA: Diagnosis not present

## 2018-01-16 DIAGNOSIS — Z91048 Other nonmedicinal substance allergy status: Secondary | ICD-10-CM | POA: Diagnosis not present

## 2018-01-16 DIAGNOSIS — M6281 Muscle weakness (generalized): Secondary | ICD-10-CM | POA: Diagnosis not present

## 2018-01-16 DIAGNOSIS — G9340 Encephalopathy, unspecified: Secondary | ICD-10-CM | POA: Diagnosis present

## 2018-01-16 DIAGNOSIS — N183 Chronic kidney disease, stage 3 (moderate): Secondary | ICD-10-CM | POA: Diagnosis not present

## 2018-01-16 DIAGNOSIS — R29702 NIHSS score 2: Secondary | ICD-10-CM | POA: Diagnosis present

## 2018-01-16 DIAGNOSIS — I639 Cerebral infarction, unspecified: Secondary | ICD-10-CM | POA: Diagnosis not present

## 2018-01-16 DIAGNOSIS — Z85828 Personal history of other malignant neoplasm of skin: Secondary | ICD-10-CM | POA: Diagnosis not present

## 2018-01-16 DIAGNOSIS — Z7983 Long term (current) use of bisphosphonates: Secondary | ICD-10-CM | POA: Diagnosis not present

## 2018-01-16 DIAGNOSIS — R29818 Other symptoms and signs involving the nervous system: Secondary | ICD-10-CM | POA: Diagnosis not present

## 2018-01-16 DIAGNOSIS — N400 Enlarged prostate without lower urinary tract symptoms: Secondary | ICD-10-CM | POA: Diagnosis not present

## 2018-01-16 DIAGNOSIS — R402252 Coma scale, best verbal response, oriented, at arrival to emergency department: Secondary | ICD-10-CM | POA: Diagnosis present

## 2018-01-16 DIAGNOSIS — R270 Ataxia, unspecified: Secondary | ICD-10-CM | POA: Diagnosis not present

## 2018-01-16 DIAGNOSIS — E785 Hyperlipidemia, unspecified: Secondary | ICD-10-CM | POA: Diagnosis present

## 2018-01-16 DIAGNOSIS — N189 Chronic kidney disease, unspecified: Secondary | ICD-10-CM | POA: Diagnosis present

## 2018-01-16 DIAGNOSIS — E7800 Pure hypercholesterolemia, unspecified: Secondary | ICD-10-CM | POA: Diagnosis present

## 2018-01-16 DIAGNOSIS — N182 Chronic kidney disease, stage 2 (mild): Secondary | ICD-10-CM

## 2018-01-16 DIAGNOSIS — I10 Essential (primary) hypertension: Secondary | ICD-10-CM | POA: Diagnosis not present

## 2018-01-16 DIAGNOSIS — I69398 Other sequelae of cerebral infarction: Secondary | ICD-10-CM | POA: Diagnosis not present

## 2018-01-16 DIAGNOSIS — R402362 Coma scale, best motor response, obeys commands, at arrival to emergency department: Secondary | ICD-10-CM | POA: Diagnosis present

## 2018-01-16 DIAGNOSIS — R41841 Cognitive communication deficit: Secondary | ICD-10-CM | POA: Diagnosis not present

## 2018-01-16 DIAGNOSIS — I6389 Other cerebral infarction: Secondary | ICD-10-CM | POA: Diagnosis not present

## 2018-01-16 DIAGNOSIS — L309 Dermatitis, unspecified: Secondary | ICD-10-CM | POA: Diagnosis present

## 2018-01-16 DIAGNOSIS — R402441 Other coma, without documented Glasgow coma scale score, or with partial score reported, in the field [EMT or ambulance]: Secondary | ICD-10-CM | POA: Diagnosis not present

## 2018-01-16 DIAGNOSIS — Z66 Do not resuscitate: Secondary | ICD-10-CM | POA: Diagnosis present

## 2018-01-16 DIAGNOSIS — I341 Nonrheumatic mitral (valve) prolapse: Secondary | ICD-10-CM | POA: Diagnosis not present

## 2018-01-16 DIAGNOSIS — I129 Hypertensive chronic kidney disease with stage 1 through stage 4 chronic kidney disease, or unspecified chronic kidney disease: Secondary | ICD-10-CM | POA: Diagnosis present

## 2018-01-16 DIAGNOSIS — R410 Disorientation, unspecified: Secondary | ICD-10-CM

## 2018-01-16 DIAGNOSIS — M810 Age-related osteoporosis without current pathological fracture: Secondary | ICD-10-CM | POA: Diagnosis present

## 2018-01-16 DIAGNOSIS — Z5189 Encounter for other specified aftercare: Secondary | ICD-10-CM | POA: Diagnosis not present

## 2018-01-16 DIAGNOSIS — R278 Other lack of coordination: Secondary | ICD-10-CM | POA: Diagnosis not present

## 2018-01-16 DIAGNOSIS — R402142 Coma scale, eyes open, spontaneous, at arrival to emergency department: Secondary | ICD-10-CM | POA: Diagnosis present

## 2018-01-16 DIAGNOSIS — Z8673 Personal history of transient ischemic attack (TIA), and cerebral infarction without residual deficits: Secondary | ICD-10-CM | POA: Diagnosis present

## 2018-01-16 LAB — POCT URINALYSIS DIP (MANUAL ENTRY)
Glucose, UA: NEGATIVE mg/dL
Leukocytes, UA: NEGATIVE
NITRITE UA: NEGATIVE
PH UA: 6.5 (ref 5.0–8.0)
RBC UA: NEGATIVE
Spec Grav, UA: 1.02 (ref 1.010–1.025)
Urobilinogen, UA: 4 E.U./dL — AB

## 2018-01-16 LAB — BASIC METABOLIC PANEL
ANION GAP: 10 (ref 5–15)
BUN: 18 mg/dL (ref 6–20)
CALCIUM: 8.7 mg/dL — AB (ref 8.9–10.3)
CHLORIDE: 104 mmol/L (ref 101–111)
CO2: 26 mmol/L (ref 22–32)
CREATININE: 1.27 mg/dL — AB (ref 0.61–1.24)
GFR calc non Af Amer: 50 mL/min — ABNORMAL LOW (ref >60)
GFR, EST AFRICAN AMERICAN: 58 mL/min — AB (ref >60)
Glucose, Bld: 103 mg/dL — ABNORMAL HIGH (ref 65–99)
Potassium: 4.6 mmol/L (ref 3.5–5.1)
SODIUM: 140 mmol/L (ref 135–145)

## 2018-01-16 LAB — CBC WITH DIFFERENTIAL/PLATELET
BASOS ABS: 0 10*3/uL (ref 0.0–0.1)
BASOS PCT: 0 %
EOS PCT: 7 %
Eosinophils Absolute: 0.7 10*3/uL (ref 0.0–0.7)
HCT: 42.4 % (ref 39.0–52.0)
Hemoglobin: 13.6 g/dL (ref 13.0–17.0)
Lymphocytes Relative: 18 %
Lymphs Abs: 1.9 10*3/uL (ref 0.7–4.0)
MCH: 32.2 pg (ref 26.0–34.0)
MCHC: 32.1 g/dL (ref 30.0–36.0)
MCV: 100.5 fL — ABNORMAL HIGH (ref 78.0–100.0)
Monocytes Absolute: 0.7 10*3/uL (ref 0.1–1.0)
Monocytes Relative: 6 %
Neutro Abs: 7.3 10*3/uL (ref 1.7–7.7)
Neutrophils Relative %: 69 %
PLATELETS: 269 10*3/uL (ref 150–400)
RBC: 4.22 MIL/uL (ref 4.22–5.81)
RDW: 12.7 % (ref 11.5–15.5)
WBC: 10.6 10*3/uL — AB (ref 4.0–10.5)

## 2018-01-16 LAB — I-STAT TROPONIN, ED: TROPONIN I, POC: 0.01 ng/mL (ref 0.00–0.08)

## 2018-01-16 MED ORDER — SODIUM CHLORIDE 0.9 % IV BOLUS (SEPSIS)
500.0000 mL | Freq: Once | INTRAVENOUS | Status: AC
  Administered 2018-01-16: 500 mL via INTRAVENOUS

## 2018-01-16 NOTE — Patient Instructions (Addendum)
Confusion Confusion is the inability to think with your usual speed or clarity. Confusion may come on quickly or slowly over time. How quickly the confusion comes on depends on the cause. Confusion can be due to any number of causes. What are the causes?  Concussion, head injury, or head trauma.  Seizures.  Stroke.  Fever.  Brain tumor.  Age related decreased brain function (dementia).  Heightened emotional states like rage or terror.  Mental illness in which the person loses the ability to determine what is real and what is not (hallucinations).  Infections such as a urinary tract infection (UTI).  Toxic effects from alcohol, drugs, or prescription medicines.  Dehydration and an imbalance of salts in the body (electrolytes).  Lack of sleep.  Low blood sugar (diabetes).  Low levels of oxygen from conditions such as chronic lung disorders.  Drug interactions or other medicine side effects.  Nutritional deficiencies, especially niacin, thiamine, vitamin C, or vitamin B.  Sudden drop in body temperature (hypothermia).  Change in routine, such as when traveling or hospitalized. What are the signs or symptoms? People often describe their thinking as cloudy or unclear when they are confused. Confusion can also include feeling disoriented. That means you are unaware of where or who you are. You may also not know what the date or time is. If confused, you may also have difficulty paying attention, remembering, and making decisions. Some people also act aggressively when they are confused. How is this diagnosed? The medical evaluation of confusion may include:  Blood and urine tests.  X-rays.  Brain and nervous system tests.  Analyzing your brain waves (electroencephalogram or EEG).  Magnetic resonance imaging (MRI) of your head.  Computed tomography (CT) scan of your head.  Mental status tests in which your health care provider may ask many questions. Some of these  questions may seem silly or strange, but they are a very important test to help diagnose and treat confusion.  How is this treated? An admission to the hospital may not be needed, but a person with confusion should not be left alone. Stay with a family member or friend until the confusion clears. Avoid alcohol, pain relievers, or sedative drugs until you have fully recovered. Do not drive until directed by your health care provider. Follow these instructions at home: What family and friends can do:  To find out if someone is confused, ask the person to state his or her name, age, and the date. If the person is unsure or answers incorrectly, he or she is confused.  Always introduce yourself, no matter how well the person knows you.  Often remind the person of his or her location.  Place a calendar and clock near the confused person.  Help the person with his or her medicines. You may want to use a pill box, an alarm as a reminder, or give the person each dose as prescribed.  Talk about current events and plans for the day.  Try to keep the environment calm, quiet, and peaceful.  Make sure the person keeps follow-up visits with his or her health care provider.  How is this prevented? Ways to prevent confusion:  Avoid alcohol.  Eat a balanced diet.  Get enough sleep.  Take medicine only as directed by your health care provider.  Do not become isolated. Spend time with other people and make plans for your days.  Keep careful watch on your blood sugar levels if you are diabetic.  Get help right   away if:  You develop severe headaches, repeated vomiting, seizures, blackouts, or slurred speech.  There is increasing confusion, weakness, numbness, restlessness, or personality changes.  You develop a loss of balance, have marked dizziness, feel uncoordinated, or fall.  You have delusions, hallucinations, or develop severe anxiety.  Your family members think you need to be  rechecked. This information is not intended to replace advice given to you by your health care provider. Make sure you discuss any questions you have with your health care provider. Document Released: 11/21/2004 Document Revised: 05/03/2016 Document Reviewed: 11/19/2013 Elsevier Interactive Patient Education  2018 [employer].     IF you received an x-ray today, you will receive an invoice from [facility]. Please contact [facility] at [phone] with questions or concerns regarding your invoice.   IF you received labwork today, you will receive an invoice from [place]. Please contact [facility] at [phone] with questions or concerns regarding your invoice.   Our billing staff will not be able to assist you with questions regarding bills from these companies.  You will be contacted with the lab results as soon as they are available. The fastest way to get your results is to activate your My Chart account. Instructions are located on the last page of this paperwork. If you have not heard from [place] regarding the results in 2 weeks, please contact this office.

## 2018-01-16 NOTE — Telephone Encounter (Signed)
rec'd call from pt's daughter.  Reported she noted a change in alertness of her dad.  Arrived at his house about 1:45 PM, and was surprised that he asked about how he was going to get to work.  Reported pt. Retired 20 years ago.  Stated he has c/o extreme fatigue and has been more emotional this week.  Also reported he fell on Sunday; EMS was called and assessed him; no injury was noted.  Per daughter, the pt. and she went on about their day. Then, he fell again, Sunday   Afternoon.  Daughter did not think he hit his head.  Reported she thinks he seems different today.  Reported he spilled his pills beside bed this morning.  He also had not shaved or gotten ready for her arrival today, which is very unusual.  Questioned if he is complaining of any other ill feeling.  Denied any other complaints when questioned per daughter.  Reported that he was alert to the day of week, and able state name of bank that they were planning to go to this afternoon.  Daughter requested an office appt. To have him looked at.  Dr. Tamala Julian not available.  Appt. Given at 3:20 PM with PA.  Daughter agreed.        Reason for Disposition . [1] Acting confused (e.g., disoriented, slurred speech) AND [2] brief (now gone)    Daughter reported new onset confusion today; Questions how he will get to work; retired about 20 years ago; no report of slurred speech, extremity weakness, or dizziness.  Answer Assessment - Initial Assessment Questions 1. LEVEL OF CONSCIOUSNESS: "How is he (she, the patient) acting right now?" (e.g., alert-oriented, confused, lethargic, stuporous, comatose)    Confused this morning with his age and the need to get to work 2. ONSET: "When did the confusion start?"  (minutes, hours, days)     Confusion  3. PATTERN "Does this come and go, or has it been constant since it started?"  "Is it present now?"     Just noticed today 4. ALCOHOL or DRUGS: "Has he been drinking alcohol or taking any drugs?"      *No  Answer* 5. NARCOTIC MEDICATIONS: "Has he been receiving any narcotic medications?" (e.g., morphine, Vicodin)     *No Answer* 6. CAUSE: "What do you think is causing the confusion?"      Unknown 7. OTHER SYMPTOMS: "Are there any other symptoms?" (e.g., difficulty breathing, headache, fever, weakness)     fell on Sunday; EMS assessed him; fell again later same day; no apparent injury; spilled his pills this morning; noticably more emotional  and extreme fatigue ; denied speech problems, no noted weakness in unilateral extremities, denied dizziness, headache  Answer Assessment - Initial Assessment Questions 1. SYMPTOM: "What is the main symptom you are concerned about?" (e.g., weakness, numbness)     Today; confused; thought he needed to get to work; retired about 20 years ago 2. ONSET: "When did this start?" (minutes, hours, days; while sleeping)     Noted change in mentation upon arrival at his house today about 1:45 PM  3. LAST NORMAL: "When was the last time you were normal (no symptoms)?"     He was last observed on Sunday by his daughter 4. PATTERN "Does this come and go, or has it been constant since it started?"  "Is it present now?"     Still thinks he should be working 5. CARDIAC SYMPTOMS: "Have you had any of the following  symptoms: chest pain, difficulty breathing, palpitations?"     Denies any complaints 6. NEUROLOGIC SYMPTOMS: "Have you had any of the following symptoms: headache, dizziness, vision loss, double vision, changes in speech, unsteady on your feet?"     Denied dizziness or weakness of extremities; denied headache, denied vision changes 7. OTHER SYMPTOMS: "Do you have any other symptoms?"     Extremely tired 8. PREGNANCY: "Is there any chance you are pregnant?" "When was your last menstrual period?"     n/a  Protocols used: CONFUSION - DELIRIUM-A-AH, NEUROLOGIC DEFICIT-A-AH

## 2018-01-16 NOTE — H&P (Signed)
[name] QAS:[id number] DOB: 16-Sep-1932 DOA: 01/16/2018     PCP: Wardell Honour, MD   Outpatient Specialists: Ortho Dr. Marlou Sa    Patient arrived to ER on 01/16/18 at 1653  Patient coming from:   home Lives  alone    Chief Complaint:  Chief Complaint  Patient presents with  . Altered Mental Status    HPI: [name] is a 84 y.o. male with medical history significant of  hypertension, hypercholesterolemia, osteoporosis, BPH, history of hip fracture ,CKD       Presented with confusion unknown when this started when patient's daughter arrived to his house at 1:45 PM he was asking her how he supposed to get to work although he has retired 20 years ago he has been having some emotional problems lately fall almost a week ago at that point he did call EMS but did not find any injuries no history of head injury.  He has been having trouble standing up from sitting position have spilled some of his pills today which is not usual for him has not shaved which is usually unusual given change in mental status daughter called primary care office and requested an appointment 312 PCP office he was noted to have positive Romberg sign and referred to emergency department for stroke evaluation Otherwise he has been kind of feeling tired for the past 2-week have not had any headache or visual changes denies localized weakness no shortness of breath nausea vomiting abdominal pain No prior history of CVA   While in ER: CT of the head was noted to be showing subacute CVA  Significant initial  Findings: Abnormal Labs Reviewed  BASIC METABOLIC PANEL - Abnormal; Notable for the following components:      Result Value   Glucose, Bld 103 (*)    Creatinine, Ser 1.27 (*)    Calcium 8.7 (*)    GFR calc non Af Amer 50 (*)    GFR calc Af Amer 58 (*)    All other components within normal limits  CBC WITH DIFFERENTIAL/PLATELET - Abnormal; Notable for the following components:   WBC 10.6 (*)    MCV 100.5  (*)    All other components within normal limits      Na 140 K 4.6  Cr    Stable,  Lab Results  Component Value Date   CREATININE 1.27 (H) 01/16/2018   CREATININE 1.46 (H) 09/10/2017   CREATININE 1.26 06/06/2017      HG/HCT  stable,       Component Value Date/Time   HGB 13.6 01/16/2018 1730   HGB 15.5 09/10/2017 1644   HCT 42.4 01/16/2018 1730   HCT 45.0 09/10/2017 1644       Troponin (Point of Care Test) Recent Labs    01/16/18 1734  TROPIPOC 0.01      UA  ordered   CT HEAD subacute stroke in the right caudate head and adjacent lentiform nucleus  CXR - NON acute   ECG:  Personally reviewed by me showing: HR : 85 Rhythm:   NSR with PVCs Ischemic changes  no evidence of ischemic changes QTC 477       ED Triage Vitals  Enc Vitals Group     BP 01/16/18 1717 118/65     Pulse Rate 01/16/18 1717 85     Resp 01/16/18 1717 16     Temp 01/16/18 1717 98.1 F (36.7 C)     Temp Source 01/16/18 1717 Oral  SpO2 01/16/18 1717 100 %     Weight --      Height --      Head Circumference --      Peak Flow --      Pain Score 01/16/18 1724 0     Pain Loc --      Pain Edu? --      Excl. in [place]? --   TMAX(24)@     on arrival  ED Triage Vitals  Enc Vitals Group     BP 01/16/18 1717 118/65     Pulse Rate 01/16/18 1717 85     Resp 01/16/18 1717 16     Temp 01/16/18 1717 98.1 F (36.7 C)     Temp Source 01/16/18 1717 Oral     SpO2 01/16/18 1717 100 %     Weight --      Height --      Head Circumference --      Peak Flow --      Pain Score 01/16/18 1724 0     Pain Loc --      Pain Edu? --      Excl. in [place]? --      Latest  Blood pressure 118/65, pulse 85, temperature 98.1 F (36.7 C), temperature source Oral, resp. rate 16, SpO2 100 %.   Following Medications were ordered in ER: Medications  sodium chloride 0.9 % bolus 500 mL (500 mLs Intravenous New Bag/Given 01/16/18 1916)       ER Provider Called: Neurology   DrLeonel Ramsay    They  Recommend transferred to [facility] Will see on arrival  Hospitalist was called for admission for CVA  Regarding pertinent Chronic problems: Known history of hypertension takes Lasix metoprolol lisinopril History of high cholesterol on pravastatin Review of Systems:    Pertinent positives include: Confusion  Constitutional:  No weight loss, night sweats, Fevers, chills, fatigue, weight loss  HEENT:  No headaches, Difficulty swallowing,Tooth/dental problems,Sore throat,  No sneezing, itching, ear ache, nasal congestion, post nasal drip,  Cardio-vascular:  No chest pain, Orthopnea, PND, anasarca, dizziness, palpitations.no Bilateral lower extremity swelling  GI:  No heartburn, indigestion, abdominal pain, nausea, vomiting, diarrhea, change in bowel habits, loss of appetite, melena, blood in stool, hematemesis Resp:  no shortness of breath at rest. No dyspnea on exertion, No excess mucus, no productive cough, No non-productive cough, No coughing up of blood.No change in color of mucus.No wheezing. Skin:  no rash or lesions. No jaundice GU:  no dysuria, change in color of urine, no urgency or frequency. No straining to urinate.  No flank pain.  Musculoskeletal:  No joint pain or no joint swelling. No decreased range of motion. No back pain.  Psych:  No change in mood or affect. No depression or anxiety. No memory loss.  Neuro: no localizing neurological complaints, no tingling, no weakness, no double vision,  no slurred speech,    As per HPI otherwise 10 point review of systems negative.   Past Medical History: Blood pressure 118/65, pulse 85, temperature 98.1 F (36.7 C), temperature source Oral, resp. rate 16, SpO2 100 %.EDMEDS Past Medical History:  Diagnosis Date  . Allergy    seasonal  fall  . Cancer ([place])    Basal cell carcinoma; multiple  . Eczema   . Hyperlipidemia   . Hypertension   . Osteoporosis         Past Surgical History:  Procedure Laterality Date  .  CATARACT EXTRACTION, BILATERAL  06/29/2015  . INTRAMEDULLARY (IM) NAIL INTERTROCHANTERIC Left 03/30/2017   Procedure: INTRAMEDULLARY (IM) NAIL INTERTROCHANTRIC;  Surgeon: [name], MD;  Location: [facility];  Service: Orthopedics;  Laterality: Left;  . MOHS SURGERY    . TONSILLECTOMY  1940 maybe    Social History:  Ambulatory   independently     reports that he has never smoked. He has never used smokeless tobacco. He reports that he does not drink alcohol or use drugs.     Family History:   Family History  Problem Relation Age of Onset  . Arthritis Mother   . Heart disease Mother 11  . Hypertension Father     Allergies: Allergies  Allergen Reactions  . Seasonal Ic [Cholestatin]      Prior to Admission medications   Medication Sig Start Date End Date Taking? Authorizing Provider  alendronate (FOSAMAX) 70 MG tablet Take 1 tablet (70 mg total) by mouth every 7 (seven) days. Take with a full glass of water on an empty stomach. 06/06/17  Yes Wardell Honour, MD  clobetasol ointment (TEMOVATE) 5.36 % Apply 1 application 2 (two) times daily topically. 09/10/17  Yes Wardell Honour, MD  fluocinonide-emollient (LIDEX-E) 0.05 % cream Apply 1 application topically 2 (two) times daily. 11/21/17  Yes Gardiner Barefoot, DPM  furosemide (LASIX) 20 MG tablet Take 1 tablet (20 mg total) daily as needed by mouth. 09/10/17  Yes Wardell Honour, MD  lisinopril (PRINIVIL,ZESTRIL) 5 MG tablet Take 1 tablet (5 mg total) daily by mouth. 09/10/17  Yes Wardell Honour, MD  metoprolol succinate (TOPROL-XL) 50 MG 24 hr tablet Take 1 tablet (50 mg total) daily by mouth. Take with or immediately following a meal. 09/10/17  Yes Wardell Honour, MD  pravastatin (PRAVACHOL) 40 MG tablet Take 1 tablet (40 mg total) daily by mouth. 09/10/17  Yes Wardell Honour, MD  Skin Protectants, Misc. (EUCERIN) cream Apply 1 application topically as needed for dry skin.   Yes [provider]   Physical Exam:  1.  General:  in No Acute distress  well   -appearing 2. Psychological: Alert and  Oriented 3. Head/ENT:     Dry Mucous Membranes                          Head Non traumatic, neck supple                            Poor Dentition 4. SKIN:   decreased Skin turgor,  Skin clean Dry extensive exzema noted, laceration of left LE  Old well healing 5. Heart: Regular rate and rhythm no Murmur, no Rub or gallop 6. Lungs:  Clear to auscultation bilaterally, no wheezes or crackles   7. Abdomen: Soft,  non-tender, Non distended  bowel sounds present 8. Lower extremities: no clubbing, cyanosis, or edema 9. Neurologically  strength 5 out of 5 in all 4 extremities cranial nerves II through XII intact 10. MSK: Normal range of motion   LABS:     Recent Labs  Lab 01/16/18 1730  WBC 10.6*  NEUTROABS 7.3  HGB 13.6  HCT 42.4  MCV 100.5*  PLT 644   Basic Metabolic Panel: Recent Labs  Lab 01/16/18 1730  NA 140  K 4.6  CL 104  CO2 26  GLUCOSE 103*  BUN 18  CREATININE 1.27*  CALCIUM 8.7*       Cultures: No  results found for: SDES, SPECREQUEST, CULT, REPTSTATUS   Radiological Exams on Admission: Ct Head Wo Contrast  Result Date: 01/16/2018 CLINICAL DATA:  84 year old with sudden onset of confusion today. Increased fatigue and confusion over the last 2 weeks. EXAM: CT HEAD WITHOUT CONTRAST TECHNIQUE: Contiguous axial images were obtained from the base of the skull through the vertex without intravenous contrast. COMPARISON:  None. FINDINGS: Brain: There is a large area of ill-defined low-density in the right caudate head and adjacent lentiform nucleus, most consistent with a subacute stroke. This measures up to 2.7 x 2.2 cm on image 15/2. There is no evidence of associated acute hemorrhage. There is localized mass effect on the frontal horn of the right lateral ventricle. No hydrocephalus. There is generalized atrophy with prominence of the ventricles and subarachnoid spaces. Patchy low-density  is present throughout the periventricular white matter. No evidence acute cortical based infarct. Vascular: Extensive intracranial vascular calcifications. No dense vessel sign. Skull: Intact without acute or focal lesions. Sinuses/Orbits: The visualized paranasal sinuses, mastoid air cells and middle ears are clear. Other: None. IMPRESSION: 1. Findings are most consistent with a subacute stroke in the right caudate head and adjacent lentiform nucleus. Consider MRI for further evaluation/aging. 2. No evidence of cortical stroke or intracranial hemorrhage. Electronically Signed   By: Richardean Sale M.D.   On: 01/16/2018 18:32    Chart has been reviewed    Assessment/Plan   84 y.o. male with medical history significant of  hypertension, hypercholesterolemia, osteoporosis, BPH, history of hip fracture ,CKD        Admitted for subacute CVA  Present on Admission: . Acute encephalopathy most likely de to recent CVA . CVA (cerebral vascular accident) ([place]) -  - will admit based on TIA/CVA protocol, await results of MRA/MRI, Carotid Doppler and Echo, obtain cardiac enzymes,  ECG,   Lipid panel, TSH. Order PT/OT evaluation. Will make sure patient is on antiplatelet agent.   Neurology consult.      . Dyslipidemia -stable continue statin check lipid panel . Benign prostatic hyperplasia - stable . Benign essential HTN - allow permissive hypertension for tonight . Chronic renal insufficiency - stable at baseline avoid nephrotoxic medications.   Other plan as per orders.  DVT prophylaxis:  SCD    Code Status:    DNR/DNI   as per patient   Family Communication:   Family not  at  Bedside   Disposition Plan:   To home once workup is complete and patient is stable                         Would benefit from PT/OT eval prior to DC   ordered                                                Consults called: Neurology    Admission status:    inpatient       Level of care     tele   transferred to  [facility]     I have spent a total of 48min on this admission  Tylicia Sherman 01/16/2018, 9:10 PM    [facility]  Pager [phone]   after 2 AM please page floor coverage PA If 7AM-7PM, please contact the day team taking care of the patient  [email or web address]  Password [email or web address]

## 2018-01-16 NOTE — Progress Notes (Signed)
   MRN: [id number] DOB: 1932-07-11  Subjective:   [name] is a 84 y.o. male presenting for sudden onset of confusion today. 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 He has been more fatigued in the past 2 weeks and has been having difficulty/confusion with his memory. Denies headache, vision changes, weakness on one side of the body, chest pain, shob, n/v, abdominal pain, dysuria, hematuria, urinary frequency. Patient is on Lasix, metoprolol, lisinopril for HTN, also takes pravastatin for HL. Denies smoking cigarettes or drinking alcohol. Denies history of stroke.  [name] has a current medication list which includes the following prescription(s): alendronate, clobetasol ointment, fluocinonide-emollient, furosemide, lisinopril, metoprolol succinate, pravastatin, and prednisone. Also is allergic to seasonal ic [cholestatin].  [name]  has a past medical history of Allergy, Cancer ([place]), Eczema, Hyperlipidemia, Hypertension, and Osteoporosis. Also  has a past surgical history that includes Tonsillectomy (1940 maybe); Cataract extraction, bilateral (06/29/2015); Mohs surgery; and Intramedullary (im) nail intertrochanteric (Left, 03/30/2017).  Objective:   Vitals: BP 100/60 (BP Location: Left Arm, Patient Position: Sitting, Cuff Size: Normal)   Pulse 73   Ht 5\' 7"  (1.702 m)   Wt 145 lb (65.8 kg)   SpO2 96%   BMI 22.71 kg/m   BP Readings from Last 3 Encounters:  01/16/18 100/60  09/10/17 122/62  08/25/17 126/80    Physical Exam  Constitutional: He is oriented to person, place, and time. He appears well-developed and well-nourished.  HENT:  Mouth/Throat: Oropharynx is clear and moist.  Eyes: Pupils are equal, round, and reactive to light. EOM are normal. No scleral icterus.  Cardiovascular: Normal rate, regular rhythm and intact distal pulses. Exam reveals no  gallop and no friction rub.  No murmur heard. Pulmonary/Chest: No respiratory distress. He has no wheezes. He has no rales.  Abdominal: Soft. Bowel sounds are normal. He exhibits no distension and no mass. There is no tenderness. There is no guarding.  Neurological: He is alert and oriented to person, place, and time. No cranial nerve deficit.  Speech intact. Positive Romberg, negative Pronator drift.  Skin: Skin is warm and dry.  Psychiatric: He has a normal mood and affect.   Results for orders placed or performed in visit on 01/16/18 (from the past 24 hour(s))  POCT urinalysis dipstick     Status: Abnormal   Collection Time: 01/16/18  3:51 PM  Result Value Ref Range   Color, UA yellow yellow   Clarity, UA clear clear   Glucose, UA negative negative mg/dL   Bilirubin, UA small (A) negative   Ketones, POC UA trace (5) (A) negative mg/dL   Spec Grav, UA 1.020 1.010 - 1.025   Blood, UA negative negative   pH, UA 6.5 5.0 - 8.0   Protein Ur, POC trace (A) negative mg/dL   Urobilinogen, UA 4.0 (A) 0.2 or 1.0 E.U./dL   Nitrite, UA Negative Negative   Leukocytes, UA Negative Negative   Assessment and Plan :   Confusion - Plan: POCT urinalysis dipstick  Positive Romberg test  Essential hypertension  Case precepted with Dr. Mitchel Honour. Will refer emergently to ER for stroke like symptoms. Patient and his daughter are in agreement with treatment plan.  Jaynee Eagles, PA-C [facility] [phone] 01/16/2018  3:31 PM

## 2018-01-16 NOTE — ED Triage Notes (Signed)
Pt has been sent from [facility] per report where family presented him stating that he was "acting different at home." Pt has no complaints but seems to be itching reporting that he has eczema. Family and PCP requested further medical evaluation concerned for a TIA and ataxia.

## 2018-01-16 NOTE — ED Provider Notes (Signed)
Emergency Department Provider Note   I have reviewed the triage vital signs and the nursing notes.   HISTORY  Chief Complaint Altered Mental Status   HPI [name] is a 84 y.o. male with PMH of HLD and HTN presents to the emergency department for evaluation of worsening generalized weakness and intermittent confusion.  Symptoms have been worsening over the last week but were especially bad this morning.  Patient's daughter is at bedside and states that he was very confused.  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  He was trying to find his bicycle which he rode to work in the past.  The home health aide also noted that he seemed more fatigued and had trouble getting out of the seated position.  The patient was seen at the primary care providers office and referred to the emergency department for further evaluation.  At the time of my evaluation, the patient's daughter states he seems to return to his mental status baseline.  He denies any pain such as headache, chest pain, abdominal pain.  No recent medication changes.  No recent falls or head trauma.    Past Medical History:  Diagnosis Date  . Allergy    seasonal  fall  . Cancer ([place])    Basal cell carcinoma; multiple  . Eczema   . Hyperlipidemia   . Hypertension   . Osteoporosis     Patient Active Problem List   Diagnosis Date Noted  . Acute encephalopathy 01/16/2018  . CVA (cerebral vascular accident) ([place]) 01/16/2018  . Nocturnal enuresis   . Fall   . Full incontinence of feces   . Benign essential HTN   . Hypoalbuminemia due to protein-calorie malnutrition ([place])   . Abnormality of gait   . Closed intertrochanteric fracture of left hip ([place]) 04/01/2017  . Acute blood loss anemia   . Leg edema   . Leukocytosis 03/29/2017  . Closed left femoral fracture ([place]) 03/29/2017  . Closed left hip fracture ([place]) 03/29/2017  . Mild intermittent asthma with acute exacerbation 01/29/2017   . Chronic renal insufficiency 09/22/2015  . Tachycardia 09/22/2015  . PVC (premature ventricular contraction) 09/22/2015  . Osteoporosis/osteopenia increased risk 06/03/2014  . Basal cell carcinoma of face 06/03/2014  . Benign prostatic hyperplasia 05/27/2013  . Eczema 04/23/2012  . Dyslipidemia 04/23/2012    Past Surgical History:  Procedure Laterality Date  . CATARACT EXTRACTION, BILATERAL  06/29/2015  . INTRAMEDULLARY (IM) NAIL INTERTROCHANTERIC Left 03/30/2017   Procedure: INTRAMEDULLARY (IM) NAIL INTERTROCHANTRIC;  Surgeon: [name], MD;  Location: [facility];  Service: Orthopedics;  Laterality: Left;  . MOHS SURGERY    . TONSILLECTOMY  1940 maybe    Current Outpatient Rx  . Order #: [id number] Class: Normal  . Order #: [id number] Class: Normal  . Order #: [id number] Class: Normal  . Order #: [id number] Class: Normal  . Order #: [id number] Class: Normal  . Order #: [id number] Class: Normal  . Order #: [id number] Class: Normal  . Order #: [id number] Class: Historical Med    Allergies Seasonal ic [cholestatin]  Family History  Problem Relation Age of Onset  . Arthritis Mother   . Heart disease Mother 15  . Hypertension Father     Social History Social History   Tobacco Use  . Smoking status: Never Smoker  . Smokeless tobacco: Never Used  Substance Use Topics  . Alcohol use: No    Comment: occas  . Drug use: No  Review of Systems  Constitutional: No fever/chills. Positive confusion earlier (resolved) and generalized weakness.  Eyes: No visual changes. ENT: No sore throat. Cardiovascular: Denies chest pain. Respiratory: Denies shortness of breath. Gastrointestinal: No abdominal pain.  No nausea, no vomiting.  No diarrhea.  No constipation. Genitourinary: Negative for dysuria. Musculoskeletal: Negative for back pain. Skin: Negative for rash. Neurological: Negative for headaches, focal weakness or numbness. Positive gait instability.   10-point ROS otherwise  negative.  ____________________________________________   PHYSICAL EXAM:  VITAL SIGNS: ED Triage Vitals  Enc Vitals Group     BP 01/16/18 1717 118/65     Pulse Rate 01/16/18 1717 85     Resp 01/16/18 1717 16     Temp 01/16/18 1717 98.1 F (36.7 C)     Temp Source 01/16/18 1717 Oral     SpO2 01/16/18 1717 100 %     Pain Score 01/16/18 1724 0   Constitutional: Alert and oriented x 3. Well appearing and in no acute distress. Eyes: Conjunctivae are normal. PERRL. EOMI. Head: Atraumatic. Nose: No congestion/rhinnorhea. Mouth/Throat: Mucous membranes are moist.  Oropharynx non-erythematous. Neck: No stridor.   Cardiovascular: Normal rate, regular rhythm. Good peripheral circulation. Grossly normal heart sounds.   Respiratory: Normal respiratory effort.  No retractions. Lungs CTAB. Gastrointestinal: Soft and nontender. No distention.  Musculoskeletal: No lower extremity tenderness nor edema. No gross deformities of extremities. Neurologic:  Normal speech and language. No gross focal neurologic deficits are appreciated. No pronator drift.   Skin:  Skin is warm, dry and intact. No rash noted.  ____________________________________________   LABS (all labs ordered are listed, but only abnormal results are displayed)  Labs Reviewed  BASIC METABOLIC PANEL - Abnormal; Notable for the following components:      Result Value   Glucose, Bld 103 (*)    Creatinine, Ser 1.27 (*)    Calcium 8.7 (*)    GFR calc non Af Amer 50 (*)    GFR calc Af Amer 58 (*)    All other components within normal limits  CBC WITH DIFFERENTIAL/PLATELET - Abnormal; Notable for the following components:   WBC 10.6 (*)    MCV 100.5 (*)    All other components within normal limits  URINE CULTURE  HEPATIC FUNCTION PANEL  LIPASE, BLOOD  URINALYSIS, ROUTINE W REFLEX MICROSCOPIC  I-STAT TROPONIN, ED   ____________________________________________  EKG   EKG Interpretation  Date/Time:  Friday January 16 2018  18:28:07 EDT Ventricular Rate:  85 PR Interval:    QRS Duration: 97 QT Interval:  401 QTC Calculation: 477 R Axis:   71 Text Interpretation:  Sinus rhythm Ventricular premature complex Probable anteroseptal infarct, old No STEMI.  Confirmed by Nanda Quinton [phone]) on 01/16/2018 6:34:02 PM       ____________________________________________  RADIOLOGY  Dg Chest 2 View  Result Date: 01/16/2018 CLINICAL DATA:  Worsening ataxia and confusion EXAM: CHEST - 2 VIEW COMPARISON:  03/29/2017, 03/19/2016 FINDINGS: Similar appearance of elevated right diaphragm with colonic inter positioning. No acute consolidation or pleural effusion. Stable cardiomediastinal silhouette with aortic atherosclerosis. No pneumothorax. Chronic moderate compression deformity of upper thoracic vertebra IMPRESSION: No active cardiopulmonary disease. Electronically Signed   By: Donavan Foil M.D.   On: 01/16/2018 20:09   Ct Head Wo Contrast  Result Date: 01/16/2018 CLINICAL DATA:  84 year old with sudden onset of confusion today. Increased fatigue and confusion over the last 2 weeks. EXAM: CT HEAD WITHOUT CONTRAST TECHNIQUE: Contiguous axial images were obtained from the base of the  skull through the vertex without intravenous contrast. COMPARISON:  None. FINDINGS: Brain: There is a large area of ill-defined low-density in the right caudate head and adjacent lentiform nucleus, most consistent with a subacute stroke. This measures up to 2.7 x 2.2 cm on image 15/2. There is no evidence of associated acute hemorrhage. There is localized mass effect on the frontal horn of the right lateral ventricle. No hydrocephalus. There is generalized atrophy with prominence of the ventricles and subarachnoid spaces. Patchy low-density is present throughout the periventricular white matter. No evidence acute cortical based infarct. Vascular: Extensive intracranial vascular calcifications. No dense vessel sign. Skull: Intact without acute or focal  lesions. Sinuses/Orbits: The visualized paranasal sinuses, mastoid air cells and middle ears are clear. Other: None. IMPRESSION: 1. Findings are most consistent with a subacute stroke in the right caudate head and adjacent lentiform nucleus. Consider MRI for further evaluation/aging. 2. No evidence of cortical stroke or intracranial hemorrhage. Electronically Signed   By: Richardean Sale M.D.   On: 01/16/2018 18:32    ____________________________________________   PROCEDURES  Procedure(s) performed:   Procedures  None ____________________________________________   INITIAL IMPRESSION / ASSESSMENT AND PLAN / ED COURSE  Pertinent labs & imaging results that were available during my care of the patient were reviewed by me and considered in my medical decision making (see chart for details).  Patient presents to the emergency department with intermittent confusion with generalized weakness.  The patient has no focal neurological deficits.  He does have a wound to the left lower extremity that is 68 weeks old and occurred when the car door swung out and struck him in the leg.  There is no surrounding cellulitis or purulent drainage from the wound.  Does not appear to be infected.  Patient has no focal neurological deficits that I can appreciate but ambulation was deferred in the setting of generalized weakness.  Plan for labs and  07:59 PM Spoke with Dr. Leonel Ramsay regarding the case. Will see in consult at [facility].  Discussed patient's case with Hospitlaist, Dr. Clayton Lefort to request admission. Patient and family (if present) updated with plan. Care transferred to Hospitalist service.  I reviewed all nursing notes, vitals, pertinent old records, EKGs, labs, imaging (as available).   ____________________________________________  FINAL CLINICAL IMPRESSION(S) / ED DIAGNOSES  Final diagnoses:  Cerebrovascular accident (CVA), unspecified mechanism ([place])     MEDICATIONS GIVEN DURING THIS  VISIT:  Medications  sodium chloride 0.9 % bolus 500 mL (500 mLs Intravenous New Bag/Given 01/16/18 1916)    Note:  This document was prepared using Dragon voice recognition software and may include unintentional dictation errors.  Nanda Quinton, MD Emergency Medicine    Long, Wonda [facility], MD 01/16/18 2041

## 2018-01-16 NOTE — ED Notes (Signed)
Pt unable to give urine sample at this time 

## 2018-01-16 NOTE — ED Notes (Signed)
Please [phone] Alanda Slim Daughter updated.

## 2018-01-16 NOTE — ED Notes (Signed)
Patient transported to X-ray 

## 2018-01-17 ENCOUNTER — Inpatient Hospital Stay (HOSPITAL_COMMUNITY): Payer: Medicare Other

## 2018-01-17 DIAGNOSIS — I63311 Cerebral infarction due to thrombosis of right middle cerebral artery: Secondary | ICD-10-CM

## 2018-01-17 DIAGNOSIS — G9340 Encephalopathy, unspecified: Secondary | ICD-10-CM

## 2018-01-17 LAB — URINALYSIS, ROUTINE W REFLEX MICROSCOPIC
Bilirubin Urine: NEGATIVE
Glucose, UA: NEGATIVE mg/dL
Hgb urine dipstick: NEGATIVE
KETONES UR: 20 mg/dL — AB
LEUKOCYTES UA: NEGATIVE
NITRITE: NEGATIVE
PROTEIN: NEGATIVE mg/dL
Specific Gravity, Urine: 1.021 (ref 1.005–1.030)
pH: 5 (ref 5.0–8.0)

## 2018-01-17 LAB — LIPID PANEL
CHOL/HDL RATIO: 2.7 ratio
CHOLESTEROL: 138 mg/dL (ref 0–200)
HDL: 52 mg/dL (ref >40)
LDL Cholesterol: 73 mg/dL (ref 0–99)
Triglycerides: 67 mg/dL (ref <150)
VLDL: 13 mg/dL (ref 0–40)

## 2018-01-17 LAB — HEMOGLOBIN A1C
Hgb A1c MFr Bld: 5 % (ref 4.8–5.6)
MEAN PLASMA GLUCOSE: 96.8 mg/dL

## 2018-01-17 LAB — TROPONIN I

## 2018-01-17 MED ORDER — FLUOCINONIDE-E 0.05 % EX CREA
TOPICAL_CREAM | Freq: Two times a day (BID) | CUTANEOUS | Status: DC
  Administered 2018-01-17: 1 via TOPICAL
  Administered 2018-01-18: 08:00:00 via TOPICAL
  Administered 2018-01-18: 1 via TOPICAL
  Administered 2018-01-19 (×2): via TOPICAL
  Administered 2018-01-20: 1 via TOPICAL
  Filled 2018-01-17: qty 15

## 2018-01-17 MED ORDER — HYDROCERIN EX CREA
1.0000 "application " | TOPICAL_CREAM | CUTANEOUS | Status: DC | PRN
  Administered 2018-01-17: 1 via TOPICAL
  Filled 2018-01-17: qty 113

## 2018-01-17 MED ORDER — METOPROLOL SUCCINATE ER 25 MG PO TB24: 50.0000 mg | ORAL_TABLET | Freq: Every day | ORAL | Status: DC

## 2018-01-17 MED ORDER — ASPIRIN 325 MG PO TABS: 325.0000 mg | ORAL_TABLET | Freq: Every day | ORAL | Status: DC

## 2018-01-17 MED ORDER — ACETAMINOPHEN 325 MG PO TABS: 650.0000 mg | ORAL_TABLET | ORAL | Status: DC | PRN

## 2018-01-17 MED ORDER — ACETAMINOPHEN 160 MG/5ML PO SOLN: 650.0000 mg | ORAL | Status: DC | PRN

## 2018-01-17 MED ORDER — CLOBETASOL PROPIONATE 0.05 % EX OINT
TOPICAL_OINTMENT | Freq: Two times a day (BID) | CUTANEOUS | Status: DC
  Administered 2018-01-17: 1 via TOPICAL
  Administered 2018-01-17 – 2018-01-19 (×5): via TOPICAL
  Administered 2018-01-20: 1 via TOPICAL
  Filled 2018-01-17 (×2): qty 15

## 2018-01-17 MED ORDER — SODIUM CHLORIDE 0.9 % IV SOLN
INTRAVENOUS | Status: DC
  Administered 2018-01-17: 21:00:00 via INTRAVENOUS
  Administered 2018-01-17: 1000 mL via INTRAVENOUS

## 2018-01-17 MED ORDER — ACETAMINOPHEN 650 MG RE SUPP: 650.0000 mg | RECTAL | Status: DC | PRN

## 2018-01-17 MED ORDER — CLOPIDOGREL BISULFATE 75 MG PO TABS
75.0000 mg | ORAL_TABLET | Freq: Every day | ORAL | Status: DC
  Administered 2018-01-18 – 2018-01-20 (×3): 75 mg via ORAL
  Filled 2018-01-17 (×3): qty 1

## 2018-01-17 MED ORDER — ASPIRIN 300 MG RE SUPP: 300.0000 mg | Freq: Every day | RECTAL | Status: DC

## 2018-01-17 MED ORDER — ASPIRIN EC 81 MG PO TBEC
81.0000 mg | DELAYED_RELEASE_TABLET | Freq: Every day | ORAL | Status: DC
  Administered 2018-01-17 – 2018-01-20 (×4): 81 mg via ORAL
  Filled 2018-01-17 (×4): qty 1

## 2018-01-17 MED ORDER — SENNOSIDES-DOCUSATE SODIUM 8.6-50 MG PO TABS: 1.0000 | ORAL_TABLET | Freq: Every evening | ORAL | Status: DC | PRN

## 2018-01-17 MED ORDER — PRAVASTATIN SODIUM 40 MG PO TABS
40.0000 mg | ORAL_TABLET | Freq: Every day | ORAL | Status: DC
  Administered 2018-01-17 – 2018-01-20 (×4): 40 mg via ORAL
  Filled 2018-01-17 (×4): qty 1

## 2018-01-17 MED ORDER — CLOPIDOGREL BISULFATE 75 MG PO TABS
300.0000 mg | ORAL_TABLET | Freq: Once | ORAL | Status: AC
  Administered 2018-01-17: 300 mg via ORAL
  Filled 2018-01-17: qty 4

## 2018-01-17 MED ORDER — STROKE: EARLY STAGES OF RECOVERY BOOK
Freq: Once | Status: AC
  Administered 2018-01-17: 03:00:00

## 2018-01-17 NOTE — Consult Note (Signed)
Neurology Consultation Reason for Consult: Stroke Referring Physician: Roel Cluck, A  CC: Confusion  History is obtained from: Patient, chart  HPI: [name] is a 84 y.o. male who presents with confusion that was first noted yesterday around 1:45 PM.  He was brought into the emergency department at [facility] was noted that he was confused.  As part of his workup, he had a CT head which demonstrates a subacute basal ganglia infarct and he was therefore admitted to [facility] for stroke workup.     LKW: Unclear tpa given?: no, unclear time of onset   ROS: A 14 point ROS was performed and is negative except as noted in the HPI.   Past Medical History:  Diagnosis Date  . Allergy    seasonal  fall  . Cancer ([place])    Basal cell carcinoma; multiple  . Eczema   . Hyperlipidemia   . Hypertension   . Osteoporosis      Family History  Problem Relation Age of Onset  . Arthritis Mother   . Heart disease Mother 22  . Hypertension Father      Social History:  reports that he has never smoked. He has never used smokeless tobacco. He reports that he does not drink alcohol or use drugs.   Exam: Current vital signs: BP (!) 104/55 (BP Location: Left Arm)   Pulse 80   Temp 98.6 F (37 C)   Resp (!) 21   Ht 5\' 8"  (1.727 m)   Wt 65.2 kg (143 lb 11.8 oz)   SpO2 100%   BMI 21.86 kg/m  Vital signs in last 24 hours: Temp:  [98.1 F (36.7 C)-98.6 F (37 C)] 98.6 F (37 C) (03/22 2014) Pulse Rate:  [73-92] 80 (03/23 0100) Resp:  [16-21] 21 (03/23 0100) BP: (100-138)/(55-71) 104/55 (03/23 0300) SpO2:  [96 %-100 %] 100 % (03/23 0100) Weight:  [65.2 kg (143 lb 11.8 oz)-65.8 kg (145 lb)] 65.2 kg (143 lb 11.8 oz) (03/23 0100)   Physical Exam  Constitutional: Appears well-developed and well-nourished.  Psych: Affect appropriate to situation Eyes: No scleral injection HENT: No OP obstrucion Head: Normocephalic.  Cardiovascular: Normal rate and regular rhythm.  Respiratory:  Effort normal, non-labored breathing GI: Soft.  No distension. There is no tenderness.  Skin: WDI  Neuro: Mental Status: Patient is awake, alert, oriented to person, place, month, year, and situation. Patient is able to give a clear and coherent history. No signs of aphasia or neglect Cranial Nerves: II: Visual Fields are full. Pupils are equal, round, and reactive to light.   III,IV, VI: EOMI without ptosis or diploplia.  V: Facial sensation is symmetric to temperature VII: Facial movement is symmetric.  VIII: hearing is intact to voice X: Uvula elevates symmetrically XI: Shoulder shrug is symmetric. XII: tongue is midline without atrophy or fasciculations.  Motor: Tone is normal. Bulk is normal.  He has 4/5 weakness of the left arm and leg. Sensory: Sensation is symmetric to light touch and temperature in the arms and legs. Cerebellar: He has intact finger-nose-finger on the right, on the left he has mild difficulty distant with weakness   I have reviewed labs in epic and the results pertinent to this consultation are: Borderline creatinine at 1.27  I have reviewed the images obtained: MRI brain-large basal ganglia infarct on the right  Impression: 84 year old male with acute basal ganglia infarct.  Though it is large for looking, I think that small vessel disease is still a likely culprit.  He will need to be admitted for further workup and therapy.  Recommendations: 1. HgbA1c, fasting lipid panel 2. Frequent neuro checks 3. Echocardiogram 4. Carotid dopplers 5. Prophylactic therapy-Antiplatelet med: Aspirin and Plavix dual therapy for 3 weeks followed by monotherapy 6. Risk factor modification 7. Telemetry monitoring 8. PT consult, OT consult, Speech consult 9. please page stroke NP  Or  PA  Or MD  from 8am -4 pm as this patient will be followed by the stroke team at this point.   You can look them up on [email or web address]      Roland Rack, MD [facility] [phone]  If 7pm- 7am, please page neurology on call as listed in [place].

## 2018-01-17 NOTE — Evaluation (Signed)
Physical Therapy Evaluation Patient Details Name: [name] MRN: [id number] DOB: [birth year] Today's Date: 01/17/2018   History of Present Illness  Pt is an 84 y/o male admitted secondary to AMS and confusion. MRI revealed 2.7 x 2.7 x 3.0 cm acute ischemic right basal ganglia infarct. PMH including but not limited to HTN, HLD and L IM nail in 2018.   Clinical Impression  Pt presented sitting in recliner chair, awake and willing to participate in therapy session. Prior to admission, pt reported that he ambulated with use of RW and was independent with ADLs. Pt lives alone in a single level house with eight steps to enter. Pt currently requires min A for transfers and min A to ambulate with RW. Pt also with poor safety awareness and has had a couple of falls recently (two on Sunday per daughter). Pt remains a bit confused this session as well. PT recommending pt for CIR to maximize his independence with functional mobility prior to returning home. PT will continue to follow pt acutely to progress mobility as tolerated.    Follow Up Recommendations CIR    Equipment Recommendations  None recommended by PT    Recommendations for Other Services Rehab consult     Precautions / Restrictions Precautions Precautions: Fall Restrictions Weight Bearing Restrictions: No      Mobility  Bed Mobility               General bed mobility comments: pt OOB in recliner chair  Transfers Overall transfer level: Needs assistance Equipment used: Rolling walker (2 wheeled) Transfers: Sit to/from Stand Sit to Stand: Min assist         General transfer comment: pt required multiple attempts and use of momentum to stand from recliner chair; assist to power into standing and for stability as pt with posterior lean  Ambulation/Gait Ambulation/Gait assistance: Min assist Ambulation Distance (Feet): 100 Feet Assistive device: Rolling walker (2 wheeled) Gait Pattern/deviations: Step-through  pattern;Decreased stride length;Trunk flexed Gait velocity: WFL   General Gait Details: pt with poor safety awareness requiring constant min A and cueing to maintain safe distance from RW and for general safety  Stairs            Wheelchair Mobility    Modified Rankin (Stroke Patients Only) Modified Rankin (Stroke Patients Only) Pre-Morbid Rankin Score: Moderately severe disability Modified Rankin: Moderately severe disability     Balance Overall balance assessment: Needs assistance;History of Falls Sitting-balance support: Feet supported Sitting balance-Leahy Scale: Fair     Standing balance support: During functional activity;Bilateral upper extremity supported Standing balance-Leahy Scale: Poor                               Pertinent Vitals/Pain Pain Assessment: No/denies pain    Home Living Family/patient expects to be discharged to:: Private residence Living Arrangements: Alone Available Help at Discharge: Family;Available PRN/intermittently Type of Home: House Home Access: Stairs to enter Entrance Stairs-Rails: Psychiatric nurse of Steps: 8 Home Layout: One level Home Equipment: Walker - 2 wheels;Bedside commode      Prior Function Level of Independence: Independent with assistive device(s)         Comments: ambulates with RW     Hand Dominance        Extremity/Trunk Assessment   Upper Extremity Assessment Upper Extremity Assessment: Defer to OT evaluation    Lower Extremity Assessment Lower Extremity Assessment: Generalized weakness    Cervical / Trunk Assessment  Cervical / Trunk Assessment: Kyphotic  Communication   Communication: HOH  Cognition Arousal/Alertness: Awake/alert Behavior During Therapy: WFL for tasks assessed/performed Overall Cognitive Status: Impaired/Different from baseline Area of Impairment: Safety/judgement;Awareness;Problem solving                          Safety/Judgement: Decreased awareness of deficits;Decreased awareness of safety Awareness: Emergent Problem Solving: Difficulty sequencing;Requires verbal cues        General Comments      Exercises     Assessment/Plan    PT Assessment Patient needs continued PT services  PT Problem List Decreased balance;Decreased mobility;Decreased coordination;Decreased cognition;Decreased knowledge of use of DME;Decreased safety awareness       PT Treatment Interventions DME instruction;Gait training;Stair training;Functional mobility training;Therapeutic activities;Therapeutic exercise;Balance training;Neuromuscular re-education;Patient/family education;Cognitive remediation    PT Goals (Current goals can be found in the Care Plan section)  Acute Rehab PT Goals Patient Stated Goal: per daughter - get a really good bath/shower PT Goal Formulation: With patient/family Time For Goal Achievement: 01/31/18 Potential to Achieve Goals: Good    Frequency Min 4X/week   Barriers to discharge Decreased caregiver support      Co-evaluation               AM-PAC PT "6 Clicks" Daily Activity  Outcome Measure Difficulty turning over in bed (including adjusting bedclothes, sheets and blankets)?: A Little Difficulty moving from lying on back to sitting on the side of the bed? : Unable Difficulty sitting down on and standing up from a chair with arms (e.g., wheelchair, bedside commode, etc,.)?: Unable Help needed moving to and from a bed to chair (including a wheelchair)?: A Little Help needed walking in hospital room?: A Little Help needed climbing 3-5 steps with a railing? : A Lot 6 Click Score: 13    End of Session Equipment Utilized During Treatment: Gait belt Activity Tolerance: Patient tolerated treatment well Patient left: in chair;with call bell/phone within reach;with chair alarm set;with family/visitor present Nurse Communication: Mobility status PT Visit Diagnosis: Other  abnormalities of gait and mobility (R26.89);Other symptoms and signs involving the nervous system (R29.898)    Time: 1510-1537 PT Time Calculation (min) (ACUTE ONLY): 27 min   Charges:   PT Evaluation $PT Eval Moderate Complexity: 1 Mod PT Treatments $Therapeutic Activity: 8-22 mins   PT G Codes:        [place], PT, DPT Center 01/17/2018, 4:28 PM

## 2018-01-17 NOTE — Progress Notes (Signed)
OT Cancellation Note  Patient Details Name: [name] MRN: [id number] DOB: [birth year]   Cancelled Treatment:    Reason Eval/Treat Not Completed: Patient at procedure or test/ unavailable  [place], OT/L  [phone] 01/17/2018 01/17/2018, 2:19 PM

## 2018-01-17 NOTE — Progress Notes (Signed)
VASCULAR LAB PRELIMINARY  PRELIMINARY  PRELIMINARY  PRELIMINARY  Carotid duplex completed.    Preliminary report:  1-39% ICA stenosis.  Right vertebral artery flow exhibits an abnormal waveform.  Left vertebral artery flow is antegrade.   Bluma Buresh, RVT 01/17/2018, 12:52 PM

## 2018-01-17 NOTE — Progress Notes (Signed)
PROGRESS NOTE    [name]  YBO:[id number] DOB: 02-10-1932 DOA: 01/16/2018 PCP: Wardell Honour, MD    Brief Narrative:  84 y.o. male with medical history significant of hypertension, hypercholesterolemia, osteoporosis, BPH, history of hip fracture ,CKD     Presented with confusion.  Assessment & Plan:   Acute encephalopathy   CVA (cerebral vascular accident) ([place]) - If cephalopathy secondary to CVA?  - Pt is currently undergoing stroke work up.  Active Problems:   Dyslipidemia - Pt on statin, will continue  Eczema - Not in favor of using oral prednisone as requested by daughter. I would however be ok with initiating topical steroid to help with symptoms (itching)    Benign prostatic hyperplasia - stable    Chronic renal insufficiency: suspected stage 2 or 3   Benign essential HTN - BP's soft  DVT prophylaxis: SCD's Code Status: DNR Family Communication: none at bedside Disposition Plan: pending results from work up   Consultants:   Neurology   Procedures: Stroke work up  Antimicrobials: none  Subjective: Pt has no new complaints.  Objective: Vitals:   01/17/18 0922 01/17/18 1150 01/17/18 1302 01/17/18 1334  BP: (!) 88/52 126/66 (!) 90/50   Pulse: 89  84   Resp: 19 19 (!) 22   Temp:    98 F (36.7 C)  TempSrc:    Oral  SpO2: 98%  96%   Weight:      Height:        Intake/Output Summary (Last 24 hours) at 01/17/2018 1411 Last data filed at 01/17/2018 0900 Gross per 24 hour  Intake 1790 ml  Output -  Net 1790 ml   Filed Weights   01/17/18 0100  Weight: 65.2 kg (143 lb 11.8 oz)    Examination:  General exam: Appears calm and comfortable, in nad. Respiratory system: Clear to auscultation. Respiratory effort normal. Equal chest rise.  Cardiovascular system: S1 & S2 heard, RRR. No JVD, murmurs, rubs, gallops or clicks. No pedal edema. Gastrointestinal system: Abdomen is nondistended, soft and nontender. No organomegaly or masses felt. Normal  bowel sounds heard. Central nervous system: no facial asymmetry. Answers questions appropriately Extremities: warm and dry Skin: No rashes, lesions or ulcers, on limited exam. Psychiatry:Mood & affect appropriate.     Data Reviewed: I have personally reviewed following labs and imaging studies  CBC: Recent Labs  Lab 01/16/18 1730  WBC 10.6*  NEUTROABS 7.3  HGB 13.6  HCT 42.4  MCV 100.5*  PLT 277   Basic Metabolic Panel: Recent Labs  Lab 01/16/18 1730  NA 140  K 4.6  CL 104  CO2 26  GLUCOSE 103*  BUN 18  CREATININE 1.27*  CALCIUM 8.7*   GFR: Estimated Creatinine Clearance: 39.9 mL/min (A) (by C-G formula based on SCr of 1.27 mg/dL (H)). Liver Function Tests: No results for input(s): AST, ALT, ALKPHOS, BILITOT, PROT, ALBUMIN in the last 168 hours. No results for input(s): LIPASE, AMYLASE in the last 168 hours. No results for input(s): AMMONIA in the last 168 hours. Coagulation Profile: No results for input(s): INR, PROTIME in the last 168 hours. Cardiac Enzymes: Recent Labs  Lab 01/17/18 0121  TROPONINI <0.03   BNP (last 3 results) No results for input(s): PROBNP in the last 8760 hours. HbA1C: Recent Labs    01/17/18 0121  HGBA1C 5.0   CBG: No results for input(s): GLUCAP in the last 168 hours. Lipid Profile: Recent Labs    01/17/18 0121  CHOL 138  HDL 52  LDLCALC 73  TRIG 67  CHOLHDL 2.7   Thyroid Function Tests: No results for input(s): TSH, T4TOTAL, FREET4, T3FREE, THYROIDAB in the last 72 hours. Anemia Panel: No results for input(s): VITAMINB12, FOLATE, FERRITIN, TIBC, IRON, RETICCTPCT in the last 72 hours. Sepsis Labs: No results for input(s): PROCALCITON, LATICACIDVEN in the last 168 hours.  No results found for this or any previous visit (from the past 240 hour(s)).       Radiology Studies: Dg Chest 2 View  Result Date: 01/16/2018 CLINICAL DATA:  Worsening ataxia and confusion EXAM: CHEST - 2 VIEW COMPARISON:  03/29/2017,  03/19/2016 FINDINGS: Similar appearance of elevated right diaphragm with colonic inter positioning. No acute consolidation or pleural effusion. Stable cardiomediastinal silhouette with aortic atherosclerosis. No pneumothorax. Chronic moderate compression deformity of upper thoracic vertebra IMPRESSION: No active cardiopulmonary disease. Electronically Signed   By: Donavan Foil M.D.   On: 01/16/2018 20:09   Ct Head Wo Contrast  Result Date: 01/16/2018 CLINICAL DATA:  84 year old with sudden onset of confusion today. Increased fatigue and confusion over the last 2 weeks. EXAM: CT HEAD WITHOUT CONTRAST TECHNIQUE: Contiguous axial images were obtained from the base of the skull through the vertex without intravenous contrast. COMPARISON:  None. FINDINGS: Brain: There is a large area of ill-defined low-density in the right caudate head and adjacent lentiform nucleus, most consistent with a subacute stroke. This measures up to 2.7 x 2.2 cm on image 15/2. There is no evidence of associated acute hemorrhage. There is localized mass effect on the frontal horn of the right lateral ventricle. No hydrocephalus. There is generalized atrophy with prominence of the ventricles and subarachnoid spaces. Patchy low-density is present throughout the periventricular white matter. No evidence acute cortical based infarct. Vascular: Extensive intracranial vascular calcifications. No dense vessel sign. Skull: Intact without acute or focal lesions. Sinuses/Orbits: The visualized paranasal sinuses, mastoid air cells and middle ears are clear. Other: None. IMPRESSION: 1. Findings are most consistent with a subacute stroke in the right caudate head and adjacent lentiform nucleus. Consider MRI for further evaluation/aging. 2. No evidence of cortical stroke or intracranial hemorrhage. Electronically Signed   By: Richardean Sale M.D.   On: 01/16/2018 18:32   Mr Brain Wo Contrast  Result Date: 01/17/2018 CLINICAL DATA:  Initial evaluation  for worsening generalized weakness and confusion. EXAM: MRI HEAD WITHOUT CONTRAST MRA HEAD WITHOUT CONTRAST TECHNIQUE: Multiplanar, multiecho pulse sequences of the brain and surrounding structures were obtained without intravenous contrast. Angiographic images of the head were obtained using MRA technique without contrast. COMPARISON:  Prior CT from 01/16/2018. FINDINGS: MRI HEAD FINDINGS Brain: Generalized age related cerebral volume loss. Patchy T2/FLAIR hyperintensity within the periventricular and deep white matter both cerebral hemispheres, most consistent with chronic small vessel ischemic disease, mild for age. Chronic microvascular changes present within the pons. Few scattered remote lacunar infarcts present within the bilateral basal ganglia and right thalamus as well as the periventricular white matter. Scattered remote bilateral cerebellar infarcts noted. Acute ischemic infarct involving the right caudate, anterior limb of the right internal capsule, and right lentiform nucleus present. Infarct measures 2.7 x 2.7 x 3.0 cm in size. Mild localized edema without significant mass effect. Few scattered foci of petechial hemorrhage present within the area of infarction (series 16001, image 33). No frank hemorrhagic transformation. No other evidence for acute or subacute ischemia. Gray-white matter differentiation otherwise maintained. No mass lesion, midline shift or mass effect. Mild ventricular prominence related global parenchymal volume loss of hydrocephalus. No extra-axial  fluid collection. Major dural sinuses are grossly patent. Pituitary gland suprasellar region normal. Midline structures intact and normal. Vascular: Major intracranial vascular flow voids are maintained. Skull and upper cervical spine: Craniocervical junction within normal limits. Upper cervical spine normal. Bone marrow signal intensity within normal limits. No scalp soft tissue abnormality. Sinuses/Orbits: Globes and orbital soft  tissues within normal limits. Patient status post cataract extraction bilaterally. Paranasal sinuses are clear. No significant mastoid effusion. Inner ear structures normal. Other: None. MRA HEAD FINDINGS ANTERIOR CIRCULATION: Study mildly degraded by motion artifact. Distal cervical segments of the internal carotid arteries are patent with antegrade flow. Petrous, cavernous, and supraclinoid segments patent without high-grade stenosis. Origin of the ophthalmic arteries patent. ICA termini widely patent. A1 segments, anterior communicating artery common anterior cerebral arteries grossly patent without high-grade stenosis. Mild atheromatous irregularity within the M1 segments without high-grade stenosis. MCA bifurcations within normal limits. No proximal M2 occlusion. Distal small vessel atheromatous irregularity throughout the MCA branches bilaterally. POSTERIOR CIRCULATION: Vertebral arteries patent to the vertebrobasilar junction without stenosis. Left vertebral artery slightly dominant. Posterior inferior cerebral arteries not well evaluated on this exam. Basilar artery widely patent to its distal aspect without stenosis. Superior cerebral arteries patent bilaterally. Both of the posterior cerebral arteries primarily supplied via the basilar. Scattered atheromatous irregularity throughout the PCAs bilaterally with associated moderate stenosis on the left with a more severe distal right P2 stenosis noted. Distal right PCA is somewhat attenuated as compared to the left. No aneurysm. IMPRESSION: MRI HEAD IMPRESSION 1. 2.7 x 2.7 x 3.0 cm acute ischemic right basal ganglia infarct. Associated mild petechial hemorrhage without frank hemorrhagic transformation. 2. No other acute intracranial abnormality. 3. Age-related cerebral atrophy with mild chronic small vessel ischemic disease with scattered remote lacunar infarcts involving the deep gray nuclei, periventricular white matter, and bilateral cerebellar hemispheres.  MRA HEAD IMPRESSION 1. Negative intracranial MRA for large vessel occlusion. 2. Moderate intracranial atherosclerotic change, most pronounced within the bilateral PCAs with there are moderate to severe bilateral P2 stenoses, right greater than left. 3. Distal small vessel atheromatous irregularity. Electronically Signed   By: Jeannine Boga M.D.   On: 01/17/2018 05:54   Mr [name] Head Wo Contrast  Result Date: 01/17/2018 CLINICAL DATA:  Initial evaluation for worsening generalized weakness and confusion. EXAM: MRI HEAD WITHOUT CONTRAST MRA HEAD WITHOUT CONTRAST TECHNIQUE: Multiplanar, multiecho pulse sequences of the brain and surrounding structures were obtained without intravenous contrast. Angiographic images of the head were obtained using MRA technique without contrast. COMPARISON:  Prior CT from 01/16/2018. FINDINGS: MRI HEAD FINDINGS Brain: Generalized age related cerebral volume loss. Patchy T2/FLAIR hyperintensity within the periventricular and deep white matter both cerebral hemispheres, most consistent with chronic small vessel ischemic disease, mild for age. Chronic microvascular changes present within the pons. Few scattered remote lacunar infarcts present within the bilateral basal ganglia and right thalamus as well as the periventricular white matter. Scattered remote bilateral cerebellar infarcts noted. Acute ischemic infarct involving the right caudate, anterior limb of the right internal capsule, and right lentiform nucleus present. Infarct measures 2.7 x 2.7 x 3.0 cm in size. Mild localized edema without significant mass effect. Few scattered foci of petechial hemorrhage present within the area of infarction (series 16001, image 33). No frank hemorrhagic transformation. No other evidence for acute or subacute ischemia. Gray-white matter differentiation otherwise maintained. No mass lesion, midline shift or mass effect. Mild ventricular prominence related global parenchymal volume loss of  hydrocephalus. No extra-axial fluid collection. Major dural sinuses  are grossly patent. Pituitary gland suprasellar region normal. Midline structures intact and normal. Vascular: Major intracranial vascular flow voids are maintained. Skull and upper cervical spine: Craniocervical junction within normal limits. Upper cervical spine normal. Bone marrow signal intensity within normal limits. No scalp soft tissue abnormality. Sinuses/Orbits: Globes and orbital soft tissues within normal limits. Patient status post cataract extraction bilaterally. Paranasal sinuses are clear. No significant mastoid effusion. Inner ear structures normal. Other: None. MRA HEAD FINDINGS ANTERIOR CIRCULATION: Study mildly degraded by motion artifact. Distal cervical segments of the internal carotid arteries are patent with antegrade flow. Petrous, cavernous, and supraclinoid segments patent without high-grade stenosis. Origin of the ophthalmic arteries patent. ICA termini widely patent. A1 segments, anterior communicating artery common anterior cerebral arteries grossly patent without high-grade stenosis. Mild atheromatous irregularity within the M1 segments without high-grade stenosis. MCA bifurcations within normal limits. No proximal M2 occlusion. Distal small vessel atheromatous irregularity throughout the MCA branches bilaterally. POSTERIOR CIRCULATION: Vertebral arteries patent to the vertebrobasilar junction without stenosis. Left vertebral artery slightly dominant. Posterior inferior cerebral arteries not well evaluated on this exam. Basilar artery widely patent to its distal aspect without stenosis. Superior cerebral arteries patent bilaterally. Both of the posterior cerebral arteries primarily supplied via the basilar. Scattered atheromatous irregularity throughout the PCAs bilaterally with associated moderate stenosis on the left with a more severe distal right P2 stenosis noted. Distal right PCA is somewhat attenuated as compared  to the left. No aneurysm. IMPRESSION: MRI HEAD IMPRESSION 1. 2.7 x 2.7 x 3.0 cm acute ischemic right basal ganglia infarct. Associated mild petechial hemorrhage without frank hemorrhagic transformation. 2. No other acute intracranial abnormality. 3. Age-related cerebral atrophy with mild chronic small vessel ischemic disease with scattered remote lacunar infarcts involving the deep gray nuclei, periventricular white matter, and bilateral cerebellar hemispheres. MRA HEAD IMPRESSION 1. Negative intracranial MRA for large vessel occlusion. 2. Moderate intracranial atherosclerotic change, most pronounced within the bilateral PCAs with there are moderate to severe bilateral P2 stenoses, right greater than left. 3. Distal small vessel atheromatous irregularity. Electronically Signed   By: Jeannine Boga M.D.   On: 01/17/2018 05:54    Scheduled Meds: . aspirin EC  81 mg Oral Daily  . [START ON 01/18/2018] clopidogrel  75 mg Oral Daily  . pravastatin  40 mg Oral Daily   Continuous Infusions: . sodium chloride Stopped (01/17/18 1327)     LOS: 1 day    Time spent: > [place], MD [facility] Pager [phone]  If 7PM-7AM, please contact night-coverage [email or web address] Password [facility] 01/17/2018, 2:11 PM

## 2018-01-17 NOTE — Progress Notes (Signed)
Patient daughter requesting "steroids" for patient eczema. Dr Wendee Beavers made aware. Home medication resumed per pharmacy advise. Patient daughter also requesting for update from neurology. NP made aware.

## 2018-01-17 NOTE — Progress Notes (Signed)
STROKE TEAM PROGRESS NOTE   SUBJECTIVE (INTERVAL HISTORY) His RN is at the bedside.  Patient is sitting in bed for lunch.  Stated that he does not know why he is in hospital, but was told to have a stroke.  Patient fully orientated, with no speech difficulty.  OBJECTIVE Temp:  [98 F (36.7 C)-98.6 F (37 C)] 98 F (36.7 C) (03/23 1334) Pulse Rate:  [65-92] 84 (03/23 1302) Cardiac Rhythm: Normal sinus rhythm (03/23 0945) Resp:  [16-23] 22 (03/23 1302) BP: (88-138)/(50-71) 90/50 (03/23 1302) SpO2:  [90 %-100 %] 96 % (03/23 1302) Weight:  [143 lb 11.8 oz (65.2 kg)-145 lb (65.8 kg)] 143 lb 11.8 oz (65.2 kg) (03/23 0100)  CBC:  Recent Labs  Lab 01/16/18 1730  WBC 10.6*  NEUTROABS 7.3  HGB 13.6  HCT 42.4  MCV 100.5*  PLT 858    Basic Metabolic Panel:  Recent Labs  Lab 01/16/18 1730  NA 140  K 4.6  CL 104  CO2 26  GLUCOSE 103*  BUN 18  CREATININE 1.27*  CALCIUM 8.7*    Lipid Panel:     Component Value Date/Time   CHOL 138 01/17/2018 0121   CHOL 159 06/06/2017 1337   TRIG 67 01/17/2018 0121   HDL 52 01/17/2018 0121   HDL 59 06/06/2017 1337   CHOLHDL 2.7 01/17/2018 0121   VLDL 13 01/17/2018 0121   LDLCALC 73 01/17/2018 0121   LDLCALC 79 06/06/2017 1337   HgbA1c:  Lab Results  Component Value Date   HGBA1C 5.0 01/17/2018   Urine Drug Screen: No results found for: LABOPIA, COCAINSCRNUR, LABBENZ, AMPHETMU, THCU, LABBARB  Alcohol Level No results found for: [place] I have personally reviewed the radiological images below and agree with the radiology interpretations.  Ct Head Wo Contrast 01/16/2018 IMPRESSION:  1. Findings are most consistent with a subacute stroke in the right caudate head and adjacent lentiform nucleus. Consider MRI for further evaluation/aging.  2. No evidence of cortical stroke or intracranial hemorrhage.    [name] Head Wo Contrast 01/17/2018 IMPRESSION:   MRI HEAD  IMPRESSION  1. 2.7 x 2.7 x 3.0 cm acute ischemic right basal  ganglia infarct. Associated mild petechial hemorrhage without frank hemorrhagic transformation.  2. No other acute intracranial abnormality.  3. Age-related cerebral atrophy with mild chronic small vessel ischemic disease with scattered remote lacunar infarcts involving the deep gray nuclei, periventricular white matter, and bilateral cerebellar hemispheres.   MRA HEAD  IMPRESSION  1. Negative intracranial MRA for large vessel occlusion.  2. Moderate intracranial atherosclerotic change, most pronounced within the bilateral PCAs with there are moderate to severe bilateral P2 stenoses, right greater than left.  3. Distal small vessel atheromatous irregularity.    Transthoracic Echocardiogram - pending   Bilateral Carotid Dopplers  01/17/2018 1-39% ICA stenosis.   Right vertebral artery flow exhibits an abnormal waveform.   Left vertebral artery flow is antegrade.    PHYSICAL EXAM Temp:  [98 F (36.7 C)-98.6 F (37 C)] 98 F (36.7 C) (03/23 1334) Pulse Rate:  [65-92] 84 (03/23 1302) Resp:  [16-23] 22 (03/23 1302) BP: (88-138)/(50-71) 90/50 (03/23 1302) SpO2:  [90 %-100 %] 96 % (03/23 1302) Weight:  [143 lb 11.8 oz (65.2 kg)] 143 lb 11.8 oz (65.2 kg) (03/23 0100)  General - Well nourished, well developed, in no apparent distress.  Ophthalmologic - Fundi not visualized due to noncooperation.  Cardiovascular - Regular rate and rhythm.  Mental Status -  Level of arousal  and orientation to time, place, and person were intact. Language including expression, naming, repetition, comprehension was assessed and found intact.  Cranial Nerves II - XII - II - Visual field intact OU. III, IV, VI - Extraocular movements intact. V - Facial sensation intact bilaterally. VII - Facial movement intact bilaterally. VIII - Hearing & vestibular intact bilaterally. X - Palate elevates symmetrically. XI - Chin turning & shoulder shrug intact bilaterally. XII - Tongue protrusion intact.  Motor  Strength - The patient's strength was symmetrical in all extremities and pronator drift was absent.  Bulk was normal and fasciculations were absent.   Motor Tone - Muscle tone was assessed at the neck and appendages and was normal.  Reflexes - The patient's reflexes were 1+ in all extremities and he had no pathological reflexes.  Sensory - Light touch, temperature/pinprick were assessed and were symmetrical.    Coordination - The patient had normal movements in the hands with no ataxia or dysmetria.  Tremor was absent.  Gait and Station - deferred   ASSESSMENT/PLAN Wesley. Dadrian Ballantine is a 84 y.o. male with history of hyperlipidemia, previous strokes by imaging and hypertension presenting with confusion. He did not receive IV t-PA due to unclear time of onset.  Stroke - Rt BG/caudate head acute infarct - small vessel disease.  Resultant back to baseline  CT head - subacute stroke in the right caudate head and adjacent lentiform nucleus  MRI head -  acute ischemic right BG infarct, remote bilateral cerebellum and left BG/caudate head infarcts  MRA head - moderate to severe bilateral P2 stenoses, right greater than left.  Patent right VA and the basal artery.  Carotid Doppler - Rt VA flow exhibits an abnormal waveform   2D Echo - pending  LDL - 73  HgbA1c - 5.0  VTE prophylaxis - SCDs Fall precautions Aspiration precautions Diet Heart Room service appropriate? Yes; Fluid consistency: Thin  No antithrombotic prior to admission, now on aspirin 81 mg daily and clopidogrel 75 mg daily.  Continue DAPT for 3 weeks and then aspirin or Plavix alone.  Patient counseled to be compliant with his antithrombotic medications  Ongoing aggressive stroke risk factor management  Therapy recommendations:  pending  Disposition:  Pending  Hypertension  Blood pressure somewhat low at times - 500 cc NS bolus yesterday  Permissive hypertension (OK if < 220/120) but gradually normalize in 5-7  days  Long-term BP goal normotensive  Hyperlipidemia  Lipid lowering medication PTA: Pravachol 40 mg daily  LDL 73, goal < 70  Current lipid lowering medication: Pravachol 40 mg daily  Continue statin at discharge  Other Stroke Risk Factors  Advanced age  Hx strokes by imaging  Other Active Problems  Mildly elevated creatinine - 1.27  Hospital day # 1  Neurology will sign off. Please call with questions. Pt will follow up with stroke clinic NP at [facility] in about 4 weeks. Thanks for the consult.  Rosalin Hawking, MD PhD Stroke Neurology 01/17/2018 4:57 PM   To contact Stroke Continuity provider, please refer to [email or web address] After hours, contact General Neurology

## 2018-01-18 ENCOUNTER — Inpatient Hospital Stay (HOSPITAL_COMMUNITY): Payer: Medicare Other

## 2018-01-18 DIAGNOSIS — I341 Nonrheumatic mitral (valve) prolapse: Secondary | ICD-10-CM

## 2018-01-18 LAB — ECHOCARDIOGRAM COMPLETE
HEIGHTINCHES: 68 in
Weight: 2299.84 oz

## 2018-01-18 NOTE — Plan of Care (Signed)
progressing 

## 2018-01-18 NOTE — Progress Notes (Signed)
PROGRESS NOTE    [name]  TDV:[id number] DOB: August 25, 1932 DOA: 01/16/2018 PCP: Wardell Honour, MD    Brief Narrative:  84 y.o. male with medical history significant of hypertension, hypercholesterolemia, osteoporosis, BPH, history of hip fracture ,CKD     Presented with confusion.  Assessment & Plan:   Acute encephalopathy   CVA (cerebral vascular accident) ([place]) - ? Is if cephalopathy secondary to CVA?  - Pt is currently undergoing stroke work up. - He seems to be answering my questions well and I suspect he may be at or near baseline  Active Problems:   Dyslipidemia - Pt on statin, will continue  Eczema - Not in favor of using oral prednisone as requested by daughter. I would however be ok with initiating topical steroid to help with symptoms (itching)    Benign prostatic hyperplasia - stable    Chronic renal insufficiency: suspected stage 2 or 3   Benign essential HTN - BP's soft  DVT prophylaxis: SCD's Code Status: DNR Family Communication: none at bedside Disposition Plan: CIR consult   Consultants:   Neurology   Procedures: Stroke work up  Antimicrobials: none  Subjective: Pt has no new complaints.  Objective: Vitals:   01/17/18 2037 01/18/18 0100 01/18/18 0500 01/18/18 1130  BP: (!) 119/58 106/63 107/63 119/68  Pulse: 87 87 80 85  Resp:      Temp:  98.2 F (36.8 C) 98.4 F (36.9 C) 98.6 F (37 C)  TempSrc: Oral Oral Oral Oral  SpO2: 94% 94% 100% 98%  Weight:      Height:        Intake/Output Summary (Last 24 hours) at 01/18/2018 1449 Last data filed at 01/17/2018 1720 Gross per 24 hour  Intake 240 ml  Output -  Net 240 ml   Filed Weights   01/17/18 0100  Weight: 65.2 kg (143 lb 11.8 oz)    Examination: Exam unchanged when compared to 01/17/2018  General exam: Appears calm and comfortable, in nad. Respiratory system: Clear to auscultation. Respiratory effort normal. Equal chest rise.  Cardiovascular system: S1 & S2 heard,  RRR. No JVD, murmurs, rubs, gallops or clicks. No pedal edema. Gastrointestinal system: Abdomen is nondistended, soft and nontender. No organomegaly or masses felt. Normal bowel sounds heard. Central nervous system: no facial asymmetry. Answers questions appropriately Extremities: warm and dry Skin: No rashes, lesions or ulcers, on limited exam. Psychiatry: Mood & affect appropriate.   Data Reviewed: I have personally reviewed following labs and imaging studies  CBC: Recent Labs  Lab 01/16/18 1730  WBC 10.6*  NEUTROABS 7.3  HGB 13.6  HCT 42.4  MCV 100.5*  PLT 062   Basic Metabolic Panel: Recent Labs  Lab 01/16/18 1730  NA 140  K 4.6  CL 104  CO2 26  GLUCOSE 103*  BUN 18  CREATININE 1.27*  CALCIUM 8.7*   GFR: Estimated Creatinine Clearance: 39.9 mL/min (A) (by C-G formula based on SCr of 1.27 mg/dL (H)). Liver Function Tests: No results for input(s): AST, ALT, ALKPHOS, BILITOT, PROT, ALBUMIN in the last 168 hours. No results for input(s): LIPASE, AMYLASE in the last 168 hours. No results for input(s): AMMONIA in the last 168 hours. Coagulation Profile: No results for input(s): INR, PROTIME in the last 168 hours. Cardiac Enzymes: Recent Labs  Lab 01/17/18 0121  TROPONINI <0.03   BNP (last 3 results) No results for input(s): PROBNP in the last 8760 hours. HbA1C: Recent Labs    01/17/18 0121  HGBA1C 5.0  CBG: No results for input(s): GLUCAP in the last 168 hours. Lipid Profile: Recent Labs    01/17/18 0121  CHOL 138  HDL 52  LDLCALC 73  TRIG 67  CHOLHDL 2.7   Thyroid Function Tests: No results for input(s): TSH, T4TOTAL, FREET4, T3FREE, THYROIDAB in the last 72 hours. Anemia Panel: No results for input(s): VITAMINB12, FOLATE, FERRITIN, TIBC, IRON, RETICCTPCT in the last 72 hours. Sepsis Labs: No results for input(s): PROCALCITON, LATICACIDVEN in the last 168 hours.  No results found for this or any previous visit (from the past 240 hour(s)).        Radiology Studies: Dg Chest 2 View  Result Date: 01/16/2018 CLINICAL DATA:  Worsening ataxia and confusion EXAM: CHEST - 2 VIEW COMPARISON:  03/29/2017, 03/19/2016 FINDINGS: Similar appearance of elevated right diaphragm with colonic inter positioning. No acute consolidation or pleural effusion. Stable cardiomediastinal silhouette with aortic atherosclerosis. No pneumothorax. Chronic moderate compression deformity of upper thoracic vertebra IMPRESSION: No active cardiopulmonary disease. Electronically Signed   By: Donavan Foil M.D.   On: 01/16/2018 20:09   Ct Head Wo Contrast  Result Date: 01/16/2018 CLINICAL DATA:  84 year old with sudden onset of confusion today. Increased fatigue and confusion over the last 2 weeks. EXAM: CT HEAD WITHOUT CONTRAST TECHNIQUE: Contiguous axial images were obtained from the base of the skull through the vertex without intravenous contrast. COMPARISON:  None. FINDINGS: Brain: There is a large area of ill-defined low-density in the right caudate head and adjacent lentiform nucleus, most consistent with a subacute stroke. This measures up to 2.7 x 2.2 cm on image 15/2. There is no evidence of associated acute hemorrhage. There is localized mass effect on the frontal horn of the right lateral ventricle. No hydrocephalus. There is generalized atrophy with prominence of the ventricles and subarachnoid spaces. Patchy low-density is present throughout the periventricular white matter. No evidence acute cortical based infarct. Vascular: Extensive intracranial vascular calcifications. No dense vessel sign. Skull: Intact without acute or focal lesions. Sinuses/Orbits: The visualized paranasal sinuses, mastoid air cells and middle ears are clear. Other: None. IMPRESSION: 1. Findings are most consistent with a subacute stroke in the right caudate head and adjacent lentiform nucleus. Consider MRI for further evaluation/aging. 2. No evidence of cortical stroke or intracranial  hemorrhage. Electronically Signed   By: Richardean Sale M.D.   On: 01/16/2018 18:32   Mr Brain Wo Contrast  Result Date: 01/17/2018 CLINICAL DATA:  Initial evaluation for worsening generalized weakness and confusion. EXAM: MRI HEAD WITHOUT CONTRAST MRA HEAD WITHOUT CONTRAST TECHNIQUE: Multiplanar, multiecho pulse sequences of the brain and surrounding structures were obtained without intravenous contrast. Angiographic images of the head were obtained using MRA technique without contrast. COMPARISON:  Prior CT from 01/16/2018. FINDINGS: MRI HEAD FINDINGS Brain: Generalized age related cerebral volume loss. Patchy T2/FLAIR hyperintensity within the periventricular and deep white matter both cerebral hemispheres, most consistent with chronic small vessel ischemic disease, mild for age. Chronic microvascular changes present within the pons. Few scattered remote lacunar infarcts present within the bilateral basal ganglia and right thalamus as well as the periventricular white matter. Scattered remote bilateral cerebellar infarcts noted. Acute ischemic infarct involving the right caudate, anterior limb of the right internal capsule, and right lentiform nucleus present. Infarct measures 2.7 x 2.7 x 3.0 cm in size. Mild localized edema without significant mass effect. Few scattered foci of petechial hemorrhage present within the area of infarction (series 16001, image 33). No frank hemorrhagic transformation. No other evidence for acute or  subacute ischemia. Gray-white matter differentiation otherwise maintained. No mass lesion, midline shift or mass effect. Mild ventricular prominence related global parenchymal volume loss of hydrocephalus. No extra-axial fluid collection. Major dural sinuses are grossly patent. Pituitary gland suprasellar region normal. Midline structures intact and normal. Vascular: Major intracranial vascular flow voids are maintained. Skull and upper cervical spine: Craniocervical junction within  normal limits. Upper cervical spine normal. Bone marrow signal intensity within normal limits. No scalp soft tissue abnormality. Sinuses/Orbits: Globes and orbital soft tissues within normal limits. Patient status post cataract extraction bilaterally. Paranasal sinuses are clear. No significant mastoid effusion. Inner ear structures normal. Other: None. MRA HEAD FINDINGS ANTERIOR CIRCULATION: Study mildly degraded by motion artifact. Distal cervical segments of the internal carotid arteries are patent with antegrade flow. Petrous, cavernous, and supraclinoid segments patent without high-grade stenosis. Origin of the ophthalmic arteries patent. ICA termini widely patent. A1 segments, anterior communicating artery common anterior cerebral arteries grossly patent without high-grade stenosis. Mild atheromatous irregularity within the M1 segments without high-grade stenosis. MCA bifurcations within normal limits. No proximal M2 occlusion. Distal small vessel atheromatous irregularity throughout the MCA branches bilaterally. POSTERIOR CIRCULATION: Vertebral arteries patent to the vertebrobasilar junction without stenosis. Left vertebral artery slightly dominant. Posterior inferior cerebral arteries not well evaluated on this exam. Basilar artery widely patent to its distal aspect without stenosis. Superior cerebral arteries patent bilaterally. Both of the posterior cerebral arteries primarily supplied via the basilar. Scattered atheromatous irregularity throughout the PCAs bilaterally with associated moderate stenosis on the left with a more severe distal right P2 stenosis noted. Distal right PCA is somewhat attenuated as compared to the left. No aneurysm. IMPRESSION: MRI HEAD IMPRESSION 1. 2.7 x 2.7 x 3.0 cm acute ischemic right basal ganglia infarct. Associated mild petechial hemorrhage without frank hemorrhagic transformation. 2. No other acute intracranial abnormality. 3. Age-related cerebral atrophy with mild chronic  small vessel ischemic disease with scattered remote lacunar infarcts involving the deep gray nuclei, periventricular white matter, and bilateral cerebellar hemispheres. MRA HEAD IMPRESSION 1. Negative intracranial MRA for large vessel occlusion. 2. Moderate intracranial atherosclerotic change, most pronounced within the bilateral PCAs with there are moderate to severe bilateral P2 stenoses, right greater than left. 3. Distal small vessel atheromatous irregularity. Electronically Signed   By: Jeannine Boga M.D.   On: 01/17/2018 05:54   Mr [name] Head Wo Contrast  Result Date: 01/17/2018 CLINICAL DATA:  Initial evaluation for worsening generalized weakness and confusion. EXAM: MRI HEAD WITHOUT CONTRAST MRA HEAD WITHOUT CONTRAST TECHNIQUE: Multiplanar, multiecho pulse sequences of the brain and surrounding structures were obtained without intravenous contrast. Angiographic images of the head were obtained using MRA technique without contrast. COMPARISON:  Prior CT from 01/16/2018. FINDINGS: MRI HEAD FINDINGS Brain: Generalized age related cerebral volume loss. Patchy T2/FLAIR hyperintensity within the periventricular and deep white matter both cerebral hemispheres, most consistent with chronic small vessel ischemic disease, mild for age. Chronic microvascular changes present within the pons. Few scattered remote lacunar infarcts present within the bilateral basal ganglia and right thalamus as well as the periventricular white matter. Scattered remote bilateral cerebellar infarcts noted. Acute ischemic infarct involving the right caudate, anterior limb of the right internal capsule, and right lentiform nucleus present. Infarct measures 2.7 x 2.7 x 3.0 cm in size. Mild localized edema without significant mass effect. Few scattered foci of petechial hemorrhage present within the area of infarction (series 16001, image 33). No frank hemorrhagic transformation. No other evidence for acute or subacute ischemia.  Gray-white matter differentiation  otherwise maintained. No mass lesion, midline shift or mass effect. Mild ventricular prominence related global parenchymal volume loss of hydrocephalus. No extra-axial fluid collection. Major dural sinuses are grossly patent. Pituitary gland suprasellar region normal. Midline structures intact and normal. Vascular: Major intracranial vascular flow voids are maintained. Skull and upper cervical spine: Craniocervical junction within normal limits. Upper cervical spine normal. Bone marrow signal intensity within normal limits. No scalp soft tissue abnormality. Sinuses/Orbits: Globes and orbital soft tissues within normal limits. Patient status post cataract extraction bilaterally. Paranasal sinuses are clear. No significant mastoid effusion. Inner ear structures normal. Other: None. MRA HEAD FINDINGS ANTERIOR CIRCULATION: Study mildly degraded by motion artifact. Distal cervical segments of the internal carotid arteries are patent with antegrade flow. Petrous, cavernous, and supraclinoid segments patent without high-grade stenosis. Origin of the ophthalmic arteries patent. ICA termini widely patent. A1 segments, anterior communicating artery common anterior cerebral arteries grossly patent without high-grade stenosis. Mild atheromatous irregularity within the M1 segments without high-grade stenosis. MCA bifurcations within normal limits. No proximal M2 occlusion. Distal small vessel atheromatous irregularity throughout the MCA branches bilaterally. POSTERIOR CIRCULATION: Vertebral arteries patent to the vertebrobasilar junction without stenosis. Left vertebral artery slightly dominant. Posterior inferior cerebral arteries not well evaluated on this exam. Basilar artery widely patent to its distal aspect without stenosis. Superior cerebral arteries patent bilaterally. Both of the posterior cerebral arteries primarily supplied via the basilar. Scattered atheromatous irregularity  throughout the PCAs bilaterally with associated moderate stenosis on the left with a more severe distal right P2 stenosis noted. Distal right PCA is somewhat attenuated as compared to the left. No aneurysm. IMPRESSION: MRI HEAD IMPRESSION 1. 2.7 x 2.7 x 3.0 cm acute ischemic right basal ganglia infarct. Associated mild petechial hemorrhage without frank hemorrhagic transformation. 2. No other acute intracranial abnormality. 3. Age-related cerebral atrophy with mild chronic small vessel ischemic disease with scattered remote lacunar infarcts involving the deep gray nuclei, periventricular white matter, and bilateral cerebellar hemispheres. MRA HEAD IMPRESSION 1. Negative intracranial MRA for large vessel occlusion. 2. Moderate intracranial atherosclerotic change, most pronounced within the bilateral PCAs with there are moderate to severe bilateral P2 stenoses, right greater than left. 3. Distal small vessel atheromatous irregularity. Electronically Signed   By: Jeannine Boga M.D.   On: 01/17/2018 05:54    Scheduled Meds: . aspirin EC  81 mg Oral Daily  . clobetasol ointment   Topical BID  . clopidogrel  75 mg Oral Daily  . fluocinonide-emollient   Topical BID  . pravastatin  40 mg Oral Daily   Continuous Infusions: . sodium chloride Stopped (01/18/18 0815)     LOS: 2 days    Time spent: > [place], MD [facility] Pager [phone]  If 7PM-7AM, please contact night-coverage [email or web address] Password [facility] 01/18/2018, 2:49 PM

## 2018-01-18 NOTE — Progress Notes (Signed)
  Echocardiogram 2D Echocardiogram has been performed.  [name] 01/18/2018, 10:43 AM

## 2018-01-19 DIAGNOSIS — N183 Chronic kidney disease, stage 3 (moderate): Secondary | ICD-10-CM

## 2018-01-19 DIAGNOSIS — I639 Cerebral infarction, unspecified: Principal | ICD-10-CM

## 2018-01-19 DIAGNOSIS — E785 Hyperlipidemia, unspecified: Secondary | ICD-10-CM

## 2018-01-19 DIAGNOSIS — I10 Essential (primary) hypertension: Secondary | ICD-10-CM

## 2018-01-19 NOTE — Clinical Social Work Note (Signed)
Clinical Social Work Assessment  Patient Details  Name: [name] MRN: [id number] Date of Birth: 13-Feb-1932  Date of referral:  01/19/18               Reason for consult:  Facility Placement                Permission sought to share information with:  Facility Sport and exercise psychologist, Family Supports Permission granted to share information::  Yes, Verbal Permission Granted  Name::     [name]  Agency::  SNF  Relationship::  Daughter  Contact Information:     Housing/Transportation Living arrangements for the past 2 months:  Single Family Home Source of Information:  Patient Patient Interpreter Needed:  None Criminal Activity/Legal Involvement Pertinent to Current Situation/Hospitalization:  No - Comment as needed Significant Relationships:  Adult Children Lives with:  Self Do you feel safe going back to the place where you live?  Yes Need for family participation in patient care:  Yes (Comment)(patient wanted CSW to call daughter)  Care giving concerns:  Patient lives home alone but will benefit from short term rehab at discharge.   Social Worker assessment / plan:  CSW met with patient and spoke with patient's daughter over the phone to discuss recommendation for SNF. CSW received permission to fax out referral and discuss bed offers. CSW checked in with facility preferences on bed availability. CSW will continue to follow.  Employment status:  Retired Forensic scientist:  Medicare PT Recommendations:  Inpatient [place] / Referral to community resources:  [place]  Patient/Family's Response to care:  Patient and daughter agreeable to SNF placement.  Patient/Family's Understanding of and Emotional Response to Diagnosis, Current Treatment, and Prognosis:  Patient discussed how he would really like to be able to go home, but agreed for CSW to call his daughter. Patient's daughter says she'll defer to medical judgment on what the team thinks is best.  Patient's daughter would prefer [place], [place], or [place] for SNF.  Emotional Assessment Appearance:  Appears stated age Attitude/Demeanor/Rapport:  Engaged Affect (typically observed):  Pleasant Orientation:  Oriented to Self, Oriented to Situation, Oriented to Place, Oriented to  Time Alcohol / Substance use:  Not Applicable Psych involvement (Current and /or in the community):  No (Comment)  Discharge Needs  Concerns to be addressed:  Care Coordination Readmission within the last 30 days:  No Current discharge risk:  Dependent with Mobility Barriers to Discharge:  Continued Medical Work up   [employer], [place] 01/19/2018, 8:53 PM

## 2018-01-19 NOTE — NC FL2 (Signed)
[place] LEVEL OF CARE SCREENING TOOL     IDENTIFICATION  Patient Name: [name] Birthdate: Mar 04, 1932 Sex: male Admission Date (Current Location): 01/16/2018  [facility] and [place] Number:  Herbalist and Address:  [facility], [place] [address] [place], [place] [address]      Provider Number: [id number]  Attending Physician Name and Address:  Velvet Bathe, MD  Relative Name and Phone Number:       Current Level of Care: Hospital Recommended Level of Care: [place] Prior Approval Number:    Date Approved/Denied:   PASRR Number: [id number]  Discharge Plan: SNF    Current Diagnoses: Patient Active Problem List   Diagnosis Date Noted  . Acute encephalopathy 01/16/2018  . CVA (cerebral vascular accident) ([place]) 01/16/2018  . Nocturnal enuresis   . Fall   . Full incontinence of feces   . Benign essential HTN   . Hypoalbuminemia due to protein-calorie malnutrition ([place])   . Abnormality of gait   . Closed intertrochanteric fracture of left hip ([place]) 04/01/2017  . Acute blood loss anemia   . Leg edema   . Leukocytosis 03/29/2017  . Closed left femoral fracture ([place]) 03/29/2017  . Closed left hip fracture ([place]) 03/29/2017  . Mild intermittent asthma with acute exacerbation 01/29/2017  . Chronic renal insufficiency 09/22/2015  . Tachycardia 09/22/2015  . PVC (premature ventricular contraction) 09/22/2015  . Osteoporosis/osteopenia increased risk 06/03/2014  . Basal cell carcinoma of face 06/03/2014  . Benign prostatic hyperplasia 05/27/2013  . Eczema 04/23/2012  . Dyslipidemia 04/23/2012    Orientation RESPIRATION BLADDER Height & Weight     Self, Time, Situation, Place  Normal Incontinent, Continent(Incontinent at times) Weight: 143 lb 11.8 oz (65.2 kg) Height:  5\' 8"  (172.7 cm)  BEHAVIORAL SYMPTOMS/MOOD NEUROLOGICAL BOWEL NUTRITION STATUS      Continent Diet  AMBULATORY STATUS COMMUNICATION OF  NEEDS Skin   Limited Assist Verbally Skin abrasions(open left leg wound on shin, foam dressing)                       Personal Care Assistance Level of Assistance  Bathing, Feeding, Dressing Bathing Assistance: Limited assistance Feeding assistance: Independent Dressing Assistance: Limited assistance     Functional Limitations Info  Sight, Hearing, Speech Sight Info: Adequate Hearing Info: Adequate Speech Info: Adequate    SPECIAL CARE FACTORS FREQUENCY  PT (By licensed PT), OT (By licensed OT)(heart healthy)     PT Frequency: 5x/wk OT Frequency: 5x/wk            Contractures Contractures Info: Not present    Additional Factors Info  Code Status, Allergies Code Status Info: DNR Allergies Info: Seasonal Ic Cholestatin           Current Medications (01/19/2018):  This is the current hospital active medication list Current Facility-Administered Medications  Medication Dose Route Frequency Provider Last Rate Last Dose  . 0.9 %  sodium chloride infusion   Intravenous Continuous Toy Baker, MD   Stopped at 01/18/18 0815  . acetaminophen (TYLENOL) tablet 650 mg  650 mg Oral Q4H PRN Toy Baker, MD       Or  . acetaminophen (TYLENOL) solution 650 mg  650 mg Per Tube Q4H PRN Doutova, Anastassia, MD       Or  . acetaminophen (TYLENOL) suppository 650 mg  650 mg Rectal Q4H PRN Doutova, Anastassia, MD      . aspirin EC tablet 81 mg  81 mg Oral Daily Rosalin Hawking, MD   81 mg at 01/19/18 0848  . clobetasol ointment (TEMOVATE) 0.05 %   Topical BID Velvet Bathe, MD      . clopidogrel (PLAVIX) tablet 75 mg  75 mg Oral Daily Greta Doom, MD   75 mg at 01/19/18 0848  . fluocinonide-emollient (LIDEX-E) 0.05 % cream   Topical BID Velvet Bathe, MD      . hydrocerin (EUCERIN) cream 1 application  1 application Topical PRN Toy Baker, MD   1 application at 76/54/65 2113  . pravastatin (PRAVACHOL) tablet 40 mg  40 mg Oral Daily Doutova,  Nyoka Lint, MD   40 mg at 01/19/18 0848  . senna-docusate (Senokot-S) tablet 1 tablet  1 tablet Oral QHS PRN Toy Baker, MD         Discharge Medications: Please see discharge summary for a list of discharge medications.  Relevant Imaging Results:  Relevant Lab Results:   Additional Information SS#: [id number]  Geralynn Ochs, LCSW

## 2018-01-19 NOTE — Evaluation (Signed)
Occupational Therapy Evaluation Patient Details Name: [name] MRN: [id number] DOB: [birth year] Today's Date: 01/19/2018    History of Present Illness Pt is an 84 y/o male admitted secondary to AMS and confusion. MRI revealed 2.7 x 2.7 x 3.0 cm acute ischemic right basal ganglia infarct. PMH including but not limited to HTN, HLD and L IM nail in 2018.   Clinical Impression   Pt admitted with above. He demonstrates the below listed deficits and will benefit from continued OT to maximize safety and independence with BADLs.  Pt presents to OT with generalized weakness, impaired balance, decreased safety awareness, decreased problem solving and sequencing.  Pt with bil. UE tremors that daughter reports are new.  He requires min A for ADLs due to impaired balance, and verbal cues due to cognitive impairment.  He lives alone with daily check ins from family.  He does not drive.  He will benefit from post acute rehab at discharge.  Will follow acutely.       Follow Up Recommendations  CIR;Supervision/Assistance - 24 hour    Equipment Recommendations  Tub/shower seat    Recommendations for Other Services       Precautions / Restrictions Precautions Precautions: Fall      Mobility Bed Mobility Overal bed mobility: Needs Assistance Bed Mobility: Supine to Sit     Supine to sit: Supervision        Transfers Overall transfer level: Needs assistance Equipment used: Rolling walker (2 wheeled) Transfers: Sit to/from [facility] Sit to Stand: Min assist Stand pivot transfers: Min assist       General transfer comment: verbal cues for hand placement.  uses momentum to move into standing.  Requires assist for balance     Balance Overall balance assessment: Needs assistance;History of Falls Sitting-balance support: Feet supported Sitting balance-Leahy Scale: Good Sitting balance - Comments: able to don/doff socks while EOB without LOB    Standing balance support:  During functional activity;Single extremity supported Standing balance-Leahy Scale: Poor Standing balance comment: requires UE support and occasional min A                            ADL either performed or assessed with clinical judgement   ADL Overall ADL's : Needs assistance/impaired Eating/Feeding: Modified independent;Sitting   Grooming: Wash/dry hands;Wash/dry face;Oral care;Brushing hair;Minimal assistance;Standing Grooming Details (indicate cue type and reason): occasional min A for balance  Upper Body Bathing: Supervision/ safety;Set up;Sitting   Lower Body Bathing: Minimal assistance;Sit to/from stand   Upper Body Dressing : Set up;Supervision/safety;Sitting   Lower Body Dressing: Minimal assistance;Sit to/from stand   Toilet Transfer: Minimal assistance;Ambulation;Comfort height toilet;Grab bars;RW Toilet Transfer Details (indicate cue type and reason): Pt requires cues for walker safety.  Attempted to leave BR without RW  Toileting- Clothing Manipulation and Hygiene: Minimal assistance;Sit to/from stand Toileting - Clothing Manipulation Details (indicate cue type and reason): assist for balance      Functional mobility during ADLs: Rolling walker;Moderate assistance;Cueing for sequencing;Cueing for safety General ADL Comments: assist for balance      Vision Baseline Vision/History: No visual deficits Patient Visual Report: No change from baseline Vision Assessment?: Yes Eye Alignment: Within Functional Limits Ocular Range of Motion: Within Functional Limits Alignment/Gaze Preference: Within Defined Limits Tracking/Visual Pursuits: Able to track stimulus in all quads without difficulty Visual Fields: No apparent deficits Additional Comments: able to locate items in room without difficulty      Perception Perception Perception  Tested?: Yes   Praxis Praxis Praxis tested?: Within functional limits    Pertinent Vitals/Pain Pain Assessment: No/denies  pain     Hand Dominance Right   Extremity/Trunk Assessment Upper Extremity Assessment Upper Extremity Assessment: RUE deficits/detail;LUE deficits/detail RUE Deficits / Details: bil. UEs tremulous  RUE Coordination: decreased fine motor LUE Deficits / Details: bil. UEs tremulous  LUE Coordination: decreased fine motor   Lower Extremity Assessment Lower Extremity Assessment: Defer to PT evaluation   Cervical / Trunk Assessment Cervical / Trunk Assessment: Kyphotic   Communication Communication Communication: HOH   Cognition Arousal/Alertness: Awake/alert Behavior During Therapy: Flat affect Overall Cognitive Status: Impaired/Different from baseline Area of Impairment: Safety/judgement;Awareness;Problem solving                         Safety/Judgement: Decreased awareness of safety;Decreased awareness of deficits Awareness: Emergent Problem Solving: Slow processing;Difficulty sequencing;Requires verbal cues General Comments: Pt is unsafe wtih RW use.  Becomes impulsive with urinary urgency    General Comments  Daughter present.  She reports that family can provide intermittent assist at discharge     Exercises     Shoulder Instructions      Home Living Family/patient expects to be discharged to:: Private residence Living Arrangements: Alone Available Help at Discharge: Family;Available PRN/intermittently Type of Home: House Home Access: Stairs to enter [employer] of Steps: 8 Entrance Stairs-Rails: Right;Left Home Layout: One level     Bathroom Shower/Tub: Teacher, early years/pre: Standard Bathroom Accessibility: No   Home Equipment: Environmental consultant - 2 wheels;Bedside commode   Additional Comments: Daughter lives 3 blocks away. Does work from home. Niece lives in town also.       Prior Functioning/Environment Level of Independence: Independent with assistive device(s)        Comments: ambulates with RW, or SPC.  He does simple meal  prep at home, does not drive.  has life alert         OT Problem List: Decreased strength;Decreased activity tolerance;Impaired balance (sitting and/or standing);Decreased coordination;Decreased cognition;Decreased safety awareness;Decreased knowledge of use of DME or AE      OT Treatment/Interventions: Self-care/ADL training;Neuromuscular education;DME and/or AE instruction;Therapeutic activities;Cognitive remediation/compensation;Patient/family education;Balance training    OT Goals(Current goals can be found in the care plan section) Acute Rehab OT Goals Patient Stated Goal: (to do whatever they tell me to do ) OT Goal Formulation: With patient/family Time For Goal Achievement: 02/02/18 Potential to Achieve Goals: Good ADL Goals Pt Will Perform Grooming: with supervision;standing Pt Will Perform Upper Body Bathing: with supervision;sitting Pt Will Perform Lower Body Bathing: with supervision;sit to/from stand Pt Will Perform Upper Body Dressing: with supervision;sitting Pt Will Perform Lower Body Dressing: with supervision;sit to/from stand Pt Will Transfer to Toilet: with supervision;ambulating;regular height toilet;bedside commode;grab bars Pt Will Perform Toileting - Clothing Manipulation and hygiene: with supervision;sit to/from stand  OT Frequency: Min 2X/week   Barriers to D/C: Decreased caregiver support          Co-evaluation              AM-PAC PT "6 Clicks" Daily Activity     Outcome Measure Help from another person eating meals?: None Help from another person taking care of personal grooming?: A Little Help from another person toileting, which includes using toliet, bedpan, or urinal?: A Little Help from another person bathing (including washing, rinsing, drying)?: A Little Help from another person to put on and taking off regular upper body clothing?: A  Little Help from another person to put on and taking off regular lower body clothing?: A Little 6 Click  Score: 19   End of Session Equipment Utilized During Treatment: Gait belt;Rolling walker Nurse Communication: Mobility status  Activity Tolerance: Patient tolerated treatment well Patient left: in chair;with call bell/phone within reach;with family/visitor present;with chair alarm set  OT Visit Diagnosis: Unsteadiness on feet (R26.81);Cognitive communication deficit (R41.841) Symptoms and signs involving cognitive functions: Cerebral infarction                Time: 5537-4827 OT Time Calculation (min): 36 min Charges:  OT General Charges $OT Visit: 1 Visit OT Evaluation $OT Eval Moderate Complexity: 1 Mod OT Treatments $Self Care/Home Management : 8-22 mins G-Codes:     [facility], OTR/L [phone]   Lucille Passy M 01/19/2018, 12:34 PM

## 2018-01-19 NOTE — Consult Note (Signed)
Physical Medicine and Rehabilitation Consult Reason for Consult: Decreased functional mobility Referring Physician: [facility]   HPI: [name] is a 84 y.o. right-handed male with history of hypertension, hyperlipidemia, CKD stage III.  History taken from chart review and patient's daughter. Per report patient lives alone.  One level home 8 steps to entry.  Independent with a rolling walker prior to admission.  Patient has never driven.  He has a daughter in the area that is self-employed.  Presented 01/16/2018 with altered mental status as well as reported fall as well as generalized weakness over the past 2 weeks.  MRI reviewed, suggesting right basal ganglia infarct.  Per report, CT/MRI with associated mild petechial hemorrhage without frank hemorrhagic transformation.  MRA negative for large vessel occlusion.  Patient did not receive TPA.  Echocardiogram with ejection fraction of 60% no wall motion abnormalities.  Carotid Dopplers with no ICA stenosis.  Neurology consulted and maintained on aspirin and Plavix times 3 weeks then Plavix alone.  Physical therapy evaluation completed with recommendations of physical medicine rehab consult. Patient believes he is at baseline level of functioning.  Daughter believe his right side appears slightly weaker.   Review of Systems  Constitutional: Negative for fever.  HENT: Negative for hearing loss.   Eyes: Negative for blurred vision and double vision.  Respiratory: Negative for cough and shortness of breath.   Cardiovascular: Negative for chest pain and palpitations.  Gastrointestinal: Positive for constipation. Negative for nausea and vomiting.  Genitourinary: Positive for urgency. Negative for dysuria, flank pain and hematuria.  Musculoskeletal: Positive for falls and myalgias.  Skin: Negative for rash.  Neurological: Positive for tremors.  All other systems reviewed and are negative.  Past Medical History:  Diagnosis Date  . Allergy    seasonal  fall  . Cancer ([place])    Basal cell carcinoma; multiple  . Eczema   . Hyperlipidemia   . Hypertension   . Osteoporosis    Past Surgical History:  Procedure Laterality Date  . CATARACT EXTRACTION, BILATERAL  06/29/2015  . INTRAMEDULLARY (IM) NAIL INTERTROCHANTERIC Left 03/30/2017   Procedure: INTRAMEDULLARY (IM) NAIL INTERTROCHANTRIC;  Surgeon: [name], MD;  Location: [place];  Service: Orthopedics;  Laterality: Left;  . MOHS SURGERY    . TONSILLECTOMY  1940 maybe   Family History  Problem Relation Age of Onset  . Arthritis Mother   . Heart disease Mother 39  . Hypertension Father    Social History:  reports that he has never smoked. He has never used smokeless tobacco. He reports that he does not drink alcohol or use drugs. Allergies:  Allergies  Allergen Reactions  . Seasonal Ic [Cholestatin]    Medications Prior to Admission  Medication Sig Dispense Refill  . alendronate (FOSAMAX) 70 MG tablet Take 1 tablet (70 mg total) by mouth every 7 (seven) days. Take with a full glass of water on an empty stomach. 4 tablet 11  . clobetasol ointment (TEMOVATE) 9.56 % Apply 1 application 2 (two) times daily topically. 60 g prn  . fluocinonide-emollient (LIDEX-E) 0.05 % cream Apply 1 application topically 2 (two) times daily. 30 g 0  . furosemide (LASIX) 20 MG tablet Take 1 tablet (20 mg total) daily as needed by mouth. 90 tablet 1  . lisinopril (PRINIVIL,ZESTRIL) 5 MG tablet Take 1 tablet (5 mg total) daily by mouth. 90 tablet 1  . metoprolol succinate (TOPROL-XL) 50 MG 24 hr tablet Take 1 tablet (50 mg total) daily by  mouth. Take with or immediately following a meal. 90 tablet 1  . pravastatin (PRAVACHOL) 40 MG tablet Take 1 tablet (40 mg total) daily by mouth. 90 tablet 1  . Skin Protectants, Misc. (EUCERIN) cream Apply 1 application topically as needed for dry skin.      Home: Home Living Family/patient expects to be discharged to:: Private residence Living  Arrangements: Alone Available Help at Discharge: Family, Available PRN/intermittently Type of Home: House Home Access: Stairs to enter [employer] of Steps: 8 Entrance Stairs-Rails: Right, Left Home Layout: One level Bathroom Shower/Tub: Tub/shower unit Home Equipment: Environmental consultant - 2 wheels, Bedside commode  Functional History: Prior Function Level of Independence: Independent with assistive device(s) Comments: ambulates with RW Functional Status:  Mobility: Bed Mobility General bed mobility comments: pt OOB in recliner chair Transfers Overall transfer level: Needs assistance Equipment used: Rolling walker (2 wheeled) Transfers: Sit to/from Stand Sit to Stand: Min assist General transfer comment: pt required multiple attempts and use of momentum to stand from recliner chair; assist to power into standing and for stability as pt with posterior lean Ambulation/Gait Ambulation/Gait assistance: Min assist Ambulation Distance (Feet): 100 Feet Assistive device: Rolling walker (2 wheeled) Gait Pattern/deviations: Step-through pattern, Decreased stride length, Trunk flexed General Gait Details: pt with poor safety awareness requiring constant min A and cueing to maintain safe distance from RW and for general safety Gait velocity: WFL    ADL:    Cognition: Cognition Overall Cognitive Status: Impaired/Different from baseline Orientation Level: Oriented X4 Cognition Arousal/Alertness: Awake/alert Behavior During Therapy: WFL for tasks assessed/performed Overall Cognitive Status: Impaired/Different from baseline Area of Impairment: Safety/judgement, Awareness, Problem solving Safety/Judgement: Decreased awareness of deficits, Decreased awareness of safety Awareness: Emergent Problem Solving: Difficulty sequencing, Requires verbal cues  Blood pressure 113/62, pulse 84, temperature 97.8 F (36.6 C), temperature source Axillary, resp. rate 16, height 5\' 8"  (1.727 m), weight  65.2 kg (143 lb 11.8 oz), SpO2 97 %. Physical Exam  Vitals reviewed. Constitutional: He is oriented to person, place, and time. He appears well-developed.  Frail  HENT:  Head: Normocephalic and atraumatic.  Eyes: EOM are normal.  Injected sclera  Neck: Normal range of motion. Neck supple. No thyromegaly present.  Cardiovascular: Normal rate, regular rhythm and normal heart sounds.  Respiratory: Effort normal and breath sounds normal. No respiratory distress.  GI: Soft. Bowel sounds are normal. He exhibits no distension.  Musculoskeletal:  No edema or tenderness in extremities  Neurological: He is alert and oriented to person, place, and time.  Patient provides his name, age and date of birth.   Follows simple commands. Motor: 4-4+/5 grossly throughout Tremors  Skin: Skin is warm and dry.  Psychiatric: He has a normal mood and affect. His behavior is normal. Thought content normal.    No results found for this or any previous visit (from the past 24 hour(s)). No results found.  Assessment/Plan: Diagnosis: acute ischemic right basal ganglia infarction Labs and images independently reviewed.  Records reviewed and summated above. Stroke: Continue secondary stroke prophylaxis and Risk Factor Modification listed below:   Antiplatelet therapy:   Blood Pressure Management:  Continue current medication with prn's with permisive HTN per primary team Statin Agent:    1. Does the need for close, 24 hr/day medical supervision in concert with the patient's rehab needs make it unreasonable for this patient to be served in a less intensive setting? No  2. Co-Morbidities requiring supervision/potential complications: HTN (monitor and provide prns in accordance with increased physical exertion  and pain), hyperlipidemia (meds), CKD stage III (avoid nephrotoxic meds). 3. Due to safety, disease management and patient education, does the patient require 24 hr/day rehab nursing? Potentially 4. Does  the patient require coordinated care of a physician, rehab nurse, PT (1-2 hrs/day, 5 days/week) and OT (1-2 hrs/day, 5 days/week) to address physical and functional deficits in the context of the above medical diagnosis(es)? Potentially Addressing deficits in the following areas: balance, endurance, locomotion, strength, transferring, bathing, dressing, toileting and psychosocial support 5. Can the patient actively participate in an intensive therapy program of at least 3 hrs of therapy per day at least 5 days per week? Yes 6. The potential for patient to make measurable gains while on inpatient rehab is excellent 7. Anticipated functional outcomes upon discharge from inpatient rehab are supervision  with PT, supervision with OT, n/a with SLP. 8. Estimated rehab length of stay to reach the above functional goals is: 4-7 days. 9. Anticipated D/C setting: Home 10. Anticipated post D/C treatments: HH therapy and Home excercise program 11. Overall Rehab/Functional Prognosis: good  RECOMMENDATIONS: This patient's condition is appropriate for continued rehabilitative care in the following setting: Patient believes he is at his baseline level of functioning.  He did well on his therapy day of evaluation.  Further, patient does not have a medical need to warrant IRF.  Recommend home with [facility] if caregiver support available, otherwise a short SNF stay with PM&R outpt follow up. Patient has agreed to participate in recommended program. Yes Note that insurance prior authorization may be required for reimbursement for recommended care.  Comment: Rehab Admissions Coordinator to follow up.  Delice Lesch, MD, ABPMR Lavon Paganini Angiulli, PA-C 01/19/2018

## 2018-01-19 NOTE — Progress Notes (Signed)
Physical Therapy Treatment Patient Details Name: [name] MRN: [id number] DOB: 10-01-1932 Today's Date: 01/19/2018    History of Present Illness Pt is an 84 y/o male admitted secondary to AMS and confusion. MRI revealed 2.7 x 2.7 x 3.0 cm acute ischemic right basal ganglia infarct. PMH including but not limited to HTN, HLD and L IM nail in 2018.    PT Comments    Patient progressing well towards PT goals. Continues to demonstrate imbalance and instability during gait training requiring external support to prevent fall. Pt with posterior bias and LOB when not supported during standing rest breaks. Worked on functional sit to stands and standing balance. 2/4 DOE noted with HR up to 120 bpm. Performed gait training without use of RW today. Pt with poor safety awareness. "I am fine." Will continue to follow.    Follow Up Recommendations  CIR     Equipment Recommendations  None recommended by PT    Recommendations for Other Services       Precautions / Restrictions Precautions Precautions: Fall Restrictions Weight Bearing Restrictions: No    Mobility  Bed Mobility Overal bed mobility: Needs Assistance Bed Mobility: Supine to Sit     Supine to sit: Supervision     General bed mobility comments: pt OOB in recliner chair  Transfers Overall transfer level: Needs assistance Equipment used: None Transfers: Sit to/from Stand Sit to Stand: Min assist Stand pivot transfers: Min assist       General transfer comment: Cues for hand placement/technique. Pt with posterior LOB multiple times when trying to stand.   Ambulation/Gait Ambulation/Gait assistance: Min assist Ambulation Distance (Feet): 115 Feet Assistive device: None Gait Pattern/deviations: Step-through pattern;Decreased stride length;Wide base of support;Staggering right;Staggering left   Gait velocity interpretation: at or above normal speed for age/gender General Gait Details: Slow, unsteady gait with  staggering to right/left and posterior lean when stopping; if PT removes support, pt with LOB. Increased gait speed. Poor safety awareness requiring constant assist for balance/support. 2/4 DOE. HR up in 120s bpm.   Stairs            Wheelchair Mobility    Modified Rankin (Stroke Patients Only) Modified Rankin (Stroke Patients Only) Pre-Morbid Rankin Score: Moderately severe disability Modified Rankin: Moderately severe disability     Balance Overall balance assessment: Needs assistance;History of Falls Sitting-balance support: Feet supported;No upper extremity supported Sitting balance-Leahy Scale: Good Sitting balance - Comments: able to don/doff socks while EOB without LOB    Standing balance support: During functional activity Standing balance-Leahy Scale: Fair Standing balance comment: Able to stand statically at times wihtout support but has posterior bias and lean with LOB multiple times.                            Cognition Arousal/Alertness: Awake/alert Behavior During Therapy: Flat affect Overall Cognitive Status: Impaired/Different from baseline Area of Impairment: Safety/judgement;Awareness;Problem solving                         Safety/Judgement: Decreased awareness of safety;Decreased awareness of deficits Awareness: Emergent Problem Solving: Slow processing;Difficulty sequencing;Requires verbal cues General Comments: poor safety awareness- has no idea he is falling backwards when stopping during walking. "I am fine"      Exercises Other Exercises Other Exercises: Sit to stand from chair x7 with cues for slow descent and working on equaling weight on BLEs with weight anterior.    General Comments  General comments (skin integrity, edema, etc.): Daughter present.  She reports that family can provide intermittent assist at discharge       Pertinent Vitals/Pain Pain Assessment: No/denies pain    Home Living Family/patient expects  to be discharged to:: Private residence Living Arrangements: Alone Available Help at Discharge: Family;Available PRN/intermittently Type of Home: House Home Access: Stairs to enter Entrance Stairs-Rails: Right;Left Home Layout: One level Home Equipment: Environmental consultant - 2 wheels;Bedside commode Additional Comments: Daughter lives 3 blocks away. Does work from home. Niece lives in town also.     Prior Function Level of Independence: Independent with assistive device(s)      Comments: ambulates with RW, or SPC.  He does simple meal prep at home, does not drive.  has life alert    PT Goals (current goals can now be found in the care plan section) Acute Rehab PT Goals Patient Stated Goal: (to do whatever they tell me to do ) Progress towards PT goals: Progressing toward goals    Frequency    Min 4X/week      PT Plan Current plan remains appropriate    Co-evaluation              AM-PAC PT "6 Clicks" Daily Activity  Outcome Measure  Difficulty turning over in bed (including adjusting bedclothes, sheets and blankets)?: None Difficulty moving from lying on back to sitting on the side of the bed? : A Little Difficulty sitting down on and standing up from a chair with arms (e.g., wheelchair, bedside commode, etc,.)?: Unable Help needed moving to and from a bed to chair (including a wheelchair)?: A Little Help needed walking in hospital room?: A Little Help needed climbing 3-5 steps with a railing? : A Lot 6 Click Score: 16    End of Session Equipment Utilized During Treatment: Gait belt Activity Tolerance: Patient tolerated treatment well Patient left: in chair;with call bell/phone within reach;with chair alarm set Nurse Communication: Mobility status PT Visit Diagnosis: Other abnormalities of gait and mobility (R26.89);Other symptoms and signs involving the nervous system (I45.809)     Time: 9833-8250 PT Time Calculation (min) (ACUTE ONLY): 15 min  Charges:  $Gait Training:  8-22 mins                    G Codes:       Wray Kearns, PT, DPT [phone]     Marguarite Arbour A Amor Packard 01/19/2018, 3:55 PM

## 2018-01-19 NOTE — Progress Notes (Signed)
I met with pt at bedside and discussed Dr. Serita Grit recommendation for [facility] with 24/7 assist of family if available or SNF if no caregiver support is available. I have left a message for his daughter, [name], to call me. I will alert RN Cm and SW. We will sign off at this time.  [phone]

## 2018-01-19 NOTE — Progress Notes (Signed)
PROGRESS NOTE    [name]  WNU:[id number] DOB: [birth year] DOA: 01/16/2018 PCP: Wardell Honour, MD    Brief Narrative:  84 y.o. male with medical history significant of hypertension, hypercholesterolemia, osteoporosis, BPH, history of hip fracture ,CKD     Presented with confusion.  Assessment & Plan:   Acute encephalopathy   CVA (cerebral vascular accident) ([place]) - suspect AMS was secondary to CVA - Pt suspected to be at baseline currently.  Active Problems:   Dyslipidemia - Pt on statin, will continue  Eczema - Not in favor of using oral prednisone as requested by daughter. I would however be ok with initiating topical steroid to help with symptoms (itching)    Benign prostatic hyperplasia - stable    Chronic renal insufficiency: suspected stage 2 or 3   Benign essential HTN - BP's soft  DVT prophylaxis: SCD's Code Status: DNR Family Communication: none at bedside Disposition Plan: CIR consulted and currently we are awaiting acceptance. Medically stable to transition once bed available  Consultants:   Neurology   Procedures: Stroke work up  Antimicrobials: none  Subjective: No acute problems reported. Daughters questions answered to her satisfaction.  Objective: Vitals:   01/18/18 2000 01/18/18 2350 01/19/18 0354 01/19/18 0928  BP: (!) 158/78 132/70 113/62 98/65  Pulse: 97 98 84 69  Resp:   16 16  Temp: 97.6 F (36.4 C) 97.7 F (36.5 C) 97.8 F (36.6 C) 98 F (36.7 C)  TempSrc: Oral Oral Axillary Axillary  SpO2: 90% 100% 97% 98%  Weight:      Height:        Intake/Output Summary (Last 24 hours) at 01/19/2018 1243 Last data filed at 01/18/2018 2210 Gross per 24 hour  Intake -  Output 200 ml  Net -200 ml   Filed Weights   01/17/18 0100  Weight: 65.2 kg (143 lb 11.8 oz)    Examination: Exam unchanged when compared to 01/18/2018  General exam: Appears calm and comfortable, in nad. Respiratory system: Clear to auscultation. Respiratory  effort normal. Equal chest rise.  Cardiovascular system: S1 & S2 heard, RRR. No JVD, murmurs, rubs, gallops or clicks. No pedal edema. Gastrointestinal system: Abdomen is nondistended, soft and nontender. No organomegaly or masses felt. Normal bowel sounds heard. Central nervous system: no facial asymmetry. Answers questions appropriately Extremities: warm and dry Skin: No rashes, lesions or ulcers, on limited exam. Psychiatry: Mood & affect appropriate.   Data Reviewed: I have personally reviewed following labs and imaging studies  CBC: Recent Labs  Lab 01/16/18 1730  WBC 10.6*  NEUTROABS 7.3  HGB 13.6  HCT 42.4  MCV 100.5*  PLT 034   Basic Metabolic Panel: Recent Labs  Lab 01/16/18 1730  NA 140  K 4.6  CL 104  CO2 26  GLUCOSE 103*  BUN 18  CREATININE 1.27*  CALCIUM 8.7*   GFR: Estimated Creatinine Clearance: 39.9 mL/min (A) (by C-G formula based on SCr of 1.27 mg/dL (H)). Liver Function Tests: No results for input(s): AST, ALT, ALKPHOS, BILITOT, PROT, ALBUMIN in the last 168 hours. No results for input(s): LIPASE, AMYLASE in the last 168 hours. No results for input(s): AMMONIA in the last 168 hours. Coagulation Profile: No results for input(s): INR, PROTIME in the last 168 hours. Cardiac Enzymes: Recent Labs  Lab 01/17/18 0121  TROPONINI <0.03   BNP (last 3 results) No results for input(s): PROBNP in the last 8760 hours. HbA1C: Recent Labs    01/17/18 0121  HGBA1C 5.0  CBG: No results for input(s): GLUCAP in the last 168 hours. Lipid Profile: Recent Labs    01/17/18 0121  CHOL 138  HDL 52  LDLCALC 73  TRIG 67  CHOLHDL 2.7   Thyroid Function Tests: No results for input(s): TSH, T4TOTAL, FREET4, T3FREE, THYROIDAB in the last 72 hours. Anemia Panel: No results for input(s): VITAMINB12, FOLATE, FERRITIN, TIBC, IRON, RETICCTPCT in the last 72 hours. Sepsis Labs: No results for input(s): PROCALCITON, LATICACIDVEN in the last 168 hours.  No  results found for this or any previous visit (from the past 240 hour(s)).   Radiology Studies: No results found.  Scheduled Meds: . aspirin EC  81 mg Oral Daily  . clobetasol ointment   Topical BID  . clopidogrel  75 mg Oral Daily  . fluocinonide-emollient   Topical BID  . pravastatin  40 mg Oral Daily   Continuous Infusions: . sodium chloride Stopped (01/18/18 0815)     LOS: 3 days    Time spent: > [place], MD [facility] Pager [phone]  If 7PM-7AM, please contact night-coverage [email or web address] Password [facility] 01/19/2018, 12:43 PM

## 2018-01-19 NOTE — Progress Notes (Signed)
SLP Cancellation Note  Patient Details Name: [name] MRN: [id number] DOB: July 26, 1932   Cancelled treatment:    Patient very tired and refused evaluation at this time. Will check back.     Charlynne Cousins Kambree Krauss 01/19/2018, 2:33 PM

## 2018-01-20 DIAGNOSIS — I639 Cerebral infarction, unspecified: Secondary | ICD-10-CM | POA: Diagnosis not present

## 2018-01-20 DIAGNOSIS — R41841 Cognitive communication deficit: Secondary | ICD-10-CM | POA: Diagnosis not present

## 2018-01-20 DIAGNOSIS — I6930 Unspecified sequelae of cerebral infarction: Secondary | ICD-10-CM | POA: Diagnosis not present

## 2018-01-20 DIAGNOSIS — L03116 Cellulitis of left lower limb: Secondary | ICD-10-CM | POA: Diagnosis not present

## 2018-01-20 DIAGNOSIS — L309 Dermatitis, unspecified: Secondary | ICD-10-CM | POA: Diagnosis not present

## 2018-01-20 DIAGNOSIS — N400 Enlarged prostate without lower urinary tract symptoms: Secondary | ICD-10-CM | POA: Diagnosis not present

## 2018-01-20 DIAGNOSIS — N189 Chronic kidney disease, unspecified: Secondary | ICD-10-CM | POA: Diagnosis not present

## 2018-01-20 DIAGNOSIS — E785 Hyperlipidemia, unspecified: Secondary | ICD-10-CM | POA: Diagnosis not present

## 2018-01-20 DIAGNOSIS — I10 Essential (primary) hypertension: Secondary | ICD-10-CM | POA: Diagnosis not present

## 2018-01-20 DIAGNOSIS — R2681 Unsteadiness on feet: Secondary | ICD-10-CM | POA: Diagnosis not present

## 2018-01-20 DIAGNOSIS — L270 Generalized skin eruption due to drugs and medicaments taken internally: Secondary | ICD-10-CM | POA: Diagnosis not present

## 2018-01-20 DIAGNOSIS — Z5189 Encounter for other specified aftercare: Secondary | ICD-10-CM | POA: Diagnosis not present

## 2018-01-20 DIAGNOSIS — R488 Other symbolic dysfunctions: Secondary | ICD-10-CM | POA: Diagnosis not present

## 2018-01-20 DIAGNOSIS — R278 Other lack of coordination: Secondary | ICD-10-CM | POA: Diagnosis not present

## 2018-01-20 DIAGNOSIS — Z8673 Personal history of transient ischemic attack (TIA), and cerebral infarction without residual deficits: Secondary | ICD-10-CM | POA: Diagnosis not present

## 2018-01-20 DIAGNOSIS — M6281 Muscle weakness (generalized): Secondary | ICD-10-CM | POA: Diagnosis not present

## 2018-01-20 DIAGNOSIS — I69398 Other sequelae of cerebral infarction: Secondary | ICD-10-CM | POA: Diagnosis not present

## 2018-01-20 DIAGNOSIS — G8194 Hemiplegia, unspecified affecting left nondominant side: Secondary | ICD-10-CM | POA: Diagnosis not present

## 2018-01-20 LAB — GLUCOSE, CAPILLARY: GLUCOSE-CAPILLARY: 198 mg/dL — AB (ref 65–99)

## 2018-01-20 MED ORDER — ASPIRIN 81 MG PO TBEC: 81.0000 mg | DELAYED_RELEASE_TABLET | Freq: Every day | ORAL | 0 refills | Status: DC

## 2018-01-20 MED ORDER — CLOPIDOGREL BISULFATE 75 MG PO TABS: 75.0000 mg | ORAL_TABLET | Freq: Every day | ORAL | 0 refills | Status: DC

## 2018-01-20 NOTE — Progress Notes (Signed)
Physical Therapy Treatment Patient Details Name: [name] MRN: [id number] DOB: 1932/03/07 Today's Date: 01/20/2018    History of Present Illness Pt is an 84 y/o male admitted secondary to AMS and confusion. MRI revealed 2.7 x 2.7 x 3.0 cm acute ischemic right basal ganglia infarct. PMH including but not limited to HTN, HLD and L IM nail in 2018.    PT Comments    Patient progressing well towards PT goals. Tolerated higher level balance challenges- side stepping, stepping over objects with Min A for balance/safety. Overall balance seems improved today with less posterior bias and no overt LOB with functional tasks. Family decided on SNF for disposition. HR ranged from 90-115 bpm. Will continue to follow and progress as tolerated.     Follow Up Recommendations  SNF     Equipment Recommendations  None recommended by PT    Recommendations for Other Services       Precautions / Restrictions Precautions Precautions: Fall Restrictions Weight Bearing Restrictions: No    Mobility  Bed Mobility Overal bed mobility: Needs Assistance Bed Mobility: Supine to Sit     Supine to sit: Supervision;HOB elevated     General bed mobility comments: No assist needed, increased time.   Transfers Overall transfer level: Needs assistance Equipment used: None Transfers: Sit to/from Stand Sit to Stand: Min guard         General transfer comment: Min guard for safety. Posterior bias in standing but no LOB. Stood from [employer], transferred to chair post ambulation.  Ambulation/Gait Ambulation/Gait assistance: Min assist Ambulation Distance (Feet): 130 Feet Assistive device: None Gait Pattern/deviations: Step-through pattern;Decreased stride length;Wide base of support;Staggering right;Staggering left   Gait velocity interpretation: at or above normal speed for age/gender General Gait Details: Slow, unsteady gait with staggering to right/left; Poor safety awareness requiring Min assist for  balance/support. 2/4 DOE. HR ranged from 90-115 bpm.   Stairs            Wheelchair Mobility    Modified Rankin (Stroke Patients Only) Modified Rankin (Stroke Patients Only) Pre-Morbid Rankin Score: Moderately severe disability Modified Rankin: Moderately severe disability     Balance Overall balance assessment: Needs assistance;History of Falls Sitting-balance support: Feet supported;No upper extremity supported Sitting balance-Leahy Scale: Good     Standing balance support: During functional activity Standing balance-Leahy Scale: Fair Standing balance comment: Able to stand at sink and brush teeth without LOB.              High level balance activites: Side stepping;Direction changes;Head turns;Sudden stops High Level Balance Comments: Able to perform side stepping along side rail with BUE support progressing to 1 UE support in both directions. Able to step over objects with Min A for support.             Cognition Arousal/Alertness: Awake/alert Behavior During Therapy: Flat affect Overall Cognitive Status: Impaired/Different from baseline Area of Impairment: Safety/judgement                         Safety/Judgement: Decreased awareness of safety;Decreased awareness of deficits Awareness: Emergent Problem Solving: Slow processing;Requires verbal cues General Comments: Safety awareness seems somewhat improved today. "I will do whatever you want me too."      Exercises      General Comments        Pertinent Vitals/Pain Pain Assessment: No/denies pain    Home Living  Prior Function            PT Goals (current goals can now be found in the care plan section) Progress towards PT goals: Progressing toward goals    Frequency    Min 3X/week      PT Plan Frequency needs to be updated;Discharge plan needs to be updated    Co-evaluation              AM-PAC PT "6 Clicks" Daily Activity  Outcome  Measure  Difficulty turning over in bed (including adjusting bedclothes, sheets and blankets)?: None Difficulty moving from lying on back to sitting on the side of the bed? : None Difficulty sitting down on and standing up from a chair with arms (e.g., wheelchair, bedside commode, etc,.)?: A Little Help needed moving to and from a bed to chair (including a wheelchair)?: A Little Help needed walking in hospital room?: A Little Help needed climbing 3-5 steps with a railing? : [employer] 6 Click Score: 19    End of Session Equipment Utilized During Treatment: Gait belt Activity Tolerance: Patient tolerated treatment well Patient left: in chair;with call bell/phone within reach;with chair alarm set Nurse Communication: Mobility status PT Visit Diagnosis: Other abnormalities of gait and mobility (R26.89);Other symptoms and signs involving the nervous system (X72.620)     Time: 3559-7416 PT Time Calculation (min) (ACUTE ONLY): 20 min  Charges:  $Neuromuscular Re-education: 8-22 mins                    G Codes:       Wray Kearns, PT, DPT [phone]     Marguarite Arbour A Surah Pelley 01/20/2018, 10:06 AM

## 2018-01-20 NOTE — Consult Note (Signed)
[facility] CM Primary Care Navigator  01/20/2018  [name] 08-23-1932 [id number]  Met with patient at the bedside to identify possible discharge needs. Patientreportsthat he had confusion and having trouble standing up (weak) that had resulted to this admission.  Patient endorses Dr.Kristi Tamala Moore with [facility] at [facility] care provider.   [facility].  Patientstates that he has beenmanaginghismedicationsat homestraight out of the containers.  According to patient, daughter ([name]) has beenprovidingtransportation to hisdoctors'appointments.  Patientreportsliving athomealone but his daughter is providingassistance withhiscare needs when needed.  Per RN report, anticipated discharge plan is skilled nursing facility (SNF) for rehabilitationper therapy recommendation.   Patient expressed understanding to callprimary care provider's officewhen he returnsbackhometo schedulea post discharge follow-upvisit within1- 2 weeksor sooner if needed. Patient letter (with PCP's contact number) was provided as areminder. Patient's RN is aware to notify and give patient's daughter the reminder letter when she discharges patient.   Explained topatient regarding[facility] CM services available for health managementand resourcesat home.  Patient indicated having no current needs or concerns for now. Patient voiced understandingofneedto seek referral from primary care provider to [facility] care management ifdeemed necessary and appropriatefor services in the [place] hereturnsback home.  [facility] care management information provided for future needs thathe may have.   For additional questions please contact:  [name], BSN, RN-BC [facility] PRIMARY CARE Navigator Cell: [phone]

## 2018-01-20 NOTE — Care Management Important Message (Signed)
Important Message  Patient Details  Name: [name] MRN: [id number] Date of Birth: 1932/07/19   Medicare Important Message Given:  Yes    [name] Circle 01/20/2018, 1:45 PM

## 2018-01-20 NOTE — Discharge Summary (Addendum)
Physician Discharge Summary  [name] ATF:[id number] DOB: 1932-04-22 DOA: 01/16/2018  PCP: Wardell Honour, MD  Admit date: 01/16/2018 Discharge date: 01/20/2018  Time spent: > 35 minutes  Recommendations for Outpatient Follow-up:  1. Ensure f/u with neurologist. 2. Pt is to go on dual antiplatelet therapy for 3 weeks then transition to either aspirin or plavix alone see details below.    Discharge Diagnoses:  Active Problems:   Dyslipidemia   Benign prostatic hyperplasia   Chronic renal insufficiency   Benign essential HTN   Acute encephalopathy   CVA (cerebral vascular accident) [facility])   Discharge Condition: stable  Diet recommendation: Heart healthy  Filed Weights   01/17/18 0100  Weight: 65.2 kg (143 lb 11.8 oz)    History of present illness:  84 y.o.malewith medical history significant of hypertension, hypercholesterolemia, osteoporosis,BPH,history of hip fracture ,CKD  Presented withconfusion.  Dx with new cva  Hospital Course:  ASSESSMENT/PLAN [name] is a 84 y.o. male with history of hyperlipidemia, previous strokes by imaging and hypertension presenting with confusion. He did not receive IV t-PA due to unclear time of onset.  Stroke - Rt BG/caudate head acute infarct - small vessel disease.  Resultant back to baseline  CT head - subacute stroke in the right caudate head and adjacent lentiform nucleus  MRI head -  acute ischemic right BG infarct, remote bilateral cerebellum and left BG/caudate head infarcts  MRA head - moderate to severe bilateral P2 stenoses, right greater than left.  Patent right VA and the basal artery.  Carotid Doppler - Rt VA flow exhibits an abnormal waveform   LDL - 73  HgbA1c - 5.0  VTE prophylaxis - SCDs  Fall precautions  Aspiration precautions  Diet Heart Room service appropriate? Yes; Fluid consistency: Thin  No antithrombotic prior to admission, now on aspirin 81 mg daily and clopidogrel 75 mg  daily.  Continue DAPT for 3 weeks and then aspirin or Plavix alone.  Patient counseled to be compliant with his antithrombotic medications  Ongoing aggressive stroke risk factor management  Disposition:  SNF  Hypertension  Blood pressure somewhat low at times - 500 cc NS bolus yesterday              Permissive hypertension (OK if < 220/120) but gradually normalize in 5-7 days              Long-term BP goal normotensive  Hyperlipidemia  Lipid lowering medication PTA: Pravachol 40 mg daily  LDL 73, goal < 70  Current lipid lowering medication: Pravachol 40 mg daily  Continue statin at discharge  Other Stroke Risk Factors  Advanced age  Hx strokes by imaging  Other Active Problems  Mildly elevated creatinine - 1.27 - Pt has CKD stage 2 or 3  Agree with neuro notes above.   Procedures: Ct Head Wo Contrast 01/16/2018 IMPRESSION:  1. Findings are most consistent with a subacute stroke in the right caudate head and adjacent lentiform nucleus. Consider MRI for further evaluation/aging.  2. No evidence of cortical stroke or intracranial hemorrhage.    Mr [name] Head Wo Contrast 01/17/2018 IMPRESSION:   MRI HEAD  IMPRESSION  1. 2.7 x 2.7 x 3.0 cm acute ischemic right basal ganglia infarct. Associated mild petechial hemorrhage without frank hemorrhagic transformation.  2. No other acute intracranial abnormality.  3. Age-related cerebral atrophy with mild chronic small vessel ischemic disease with scattered remote lacunar infarcts involving the deep gray nuclei, periventricular white matter, and bilateral cerebellar hemispheres.  MRA HEAD  IMPRESSION  1. Negative intracranial MRA for large vessel occlusion.  2. Moderate intracranial atherosclerotic change, most pronounced within the bilateral PCAs with there are moderate to severe bilateral P2 stenoses, right greater than left.  3. Distal small vessel atheromatous irregularity.   Bilateral Carotid Dopplers   01/17/2018 1-39% ICA stenosis.  Right vertebral artery flow exhibits an abnormal waveform.  Left vertebral artery flow is antegrade.    Consultations:  Neurology  Discharge Exam: Vitals:   01/20/18 0805 01/20/18 1304  BP: (!) 120/55 110/63  Pulse: 97 96  Resp:  20  Temp: 97.8 F (36.6 C) 97.8 F (36.6 C)  SpO2: 100% 96%    General: Pt in nad, alert and awake Cardiovascular: rrr, no rubs Respiratory: no increased wob, no wheezes  Discharge Instructions   Discharge Instructions    Ambulatory referral to Neurology   Complete by:  As directed    Follow up with stroke clinic NP (Jessica Vanschaick or Cecille Rubin, if both not available, consider Zachery Dauer, or Ahern) at [facility] in about 4 weeks. Thanks.   Ambulatory referral to Physical Medicine Rehab   Complete by:  As directed    1 month post-hospital stroke follow up   Call MD for:  severe uncontrolled pain   Complete by:  As directed    Call MD for:  temperature >100.4   Complete by:  As directed    Diet - low sodium heart healthy   Complete by:  As directed    Discharge instructions   Complete by:  As directed    Please f/u with your neurologist for further evaluation and recommendations.   Increase activity slowly   Complete by:  As directed      Allergies as of 01/20/2018      Reactions   Seasonal Ic [cholestatin]       Medication List    STOP taking these medications   lisinopril 5 MG tablet Commonly known as:  PRINIVIL,ZESTRIL     TAKE these medications   alendronate 70 MG tablet Commonly known as:  FOSAMAX Take 1 tablet (70 mg total) by mouth every 7 (seven) days. Take with a full glass of water on an empty stomach.   aspirin 81 MG EC tablet Take 1 tablet (81 mg total) by mouth daily. Start taking on:  01/21/2018   clobetasol ointment 0.05 % Commonly known as:  TEMOVATE Apply 1 application 2 (two) times daily topically.   clopidogrel 75 MG tablet Commonly known as:  PLAVIX Take 1  tablet (75 mg total) by mouth daily. Start taking on:  01/21/2018   eucerin cream Apply 1 application topically as needed for dry skin.   fluocinonide-emollient 0.05 % cream Commonly known as:  LIDEX-E Apply 1 application topically 2 (two) times daily.   furosemide 20 MG tablet Commonly known as:  LASIX Take 1 tablet (20 mg total) daily as needed by mouth.   metoprolol succinate 50 MG 24 hr tablet Commonly known as:  TOPROL-XL Take 1 tablet (50 mg total) daily by mouth. Take with or immediately following a meal.   pravastatin 40 MG tablet Commonly known as:  PRAVACHOL Take 1 tablet (40 mg total) daily by mouth.      Allergies  Allergen Reactions  . Seasonal Ic [Cholestatin]    Follow-up Information    Venancio Poisson, NP. Schedule an appointment as soon as possible for a visit in 4 week(s).   Specialty:  Nurse Practitioner Contact information: [id number] [place] [address] [phone]            The results of significant diagnostics from this hospitalization (including imaging, microbiology, ancillary and laboratory) are listed below for reference.    Significant Diagnostic Studies: Dg Chest 2 View  Result Date: 01/16/2018 CLINICAL DATA:  Worsening ataxia and confusion EXAM: CHEST - 2 VIEW COMPARISON:  03/29/2017, 03/19/2016 FINDINGS: Similar appearance of elevated right diaphragm with colonic inter positioning. No acute consolidation or pleural effusion. Stable cardiomediastinal silhouette with aortic atherosclerosis. No pneumothorax. Chronic moderate compression deformity of upper thoracic vertebra IMPRESSION: No active cardiopulmonary disease. Electronically Signed   By: Donavan Foil M.D.   On: 01/16/2018 20:09   Ct Head Wo Contrast  Result Date: 01/16/2018 CLINICAL DATA:  84 year old with sudden onset of confusion today. Increased fatigue and confusion over the last 2 weeks. EXAM: CT HEAD WITHOUT CONTRAST TECHNIQUE: Contiguous axial images were  obtained from the base of the skull through the vertex without intravenous contrast. COMPARISON:  None. FINDINGS: Brain: There is a large area of ill-defined low-density in the right caudate head and adjacent lentiform nucleus, most consistent with a subacute stroke. This measures up to 2.7 x 2.2 cm on image 15/2. There is no evidence of associated acute hemorrhage. There is localized mass effect on the frontal horn of the right lateral ventricle. No hydrocephalus. There is generalized atrophy with prominence of the ventricles and subarachnoid spaces. Patchy low-density is present throughout the periventricular white matter. No evidence acute cortical based infarct. Vascular: Extensive intracranial vascular calcifications. No dense vessel sign. Skull: Intact without acute or focal lesions. Sinuses/Orbits: The visualized paranasal sinuses, mastoid air cells and middle ears are clear. Other: None. IMPRESSION: 1. Findings are most consistent with a subacute stroke in the right caudate head and adjacent lentiform nucleus. Consider MRI for further evaluation/aging. 2. No evidence of cortical stroke or intracranial hemorrhage. Electronically Signed   By: Richardean Sale M.D.   On: 01/16/2018 18:32   Mr Brain Wo Contrast  Result Date: 01/17/2018 CLINICAL DATA:  Initial evaluation for worsening generalized weakness and confusion. EXAM: MRI HEAD WITHOUT CONTRAST MRA HEAD WITHOUT CONTRAST TECHNIQUE: Multiplanar, multiecho pulse sequences of the brain and surrounding structures were obtained without intravenous contrast. Angiographic images of the head were obtained using MRA technique without contrast. COMPARISON:  Prior CT from 01/16/2018. FINDINGS: MRI HEAD FINDINGS Brain: Generalized age related cerebral volume loss. Patchy T2/FLAIR hyperintensity within the periventricular and deep white matter both cerebral hemispheres, most consistent with chronic small vessel ischemic disease, mild for age. Chronic microvascular  changes present within the pons. Few scattered remote lacunar infarcts present within the bilateral basal ganglia and right thalamus as well as the periventricular white matter. Scattered remote bilateral cerebellar infarcts noted. Acute ischemic infarct involving the right caudate, anterior limb of the right internal capsule, and right lentiform nucleus present. Infarct measures 2.7 x 2.7 x 3.0 cm in size. Mild localized edema without significant mass effect. Few scattered foci of petechial hemorrhage present within the area of infarction (series 16001, image 33). No frank hemorrhagic transformation. No other evidence for acute or subacute ischemia. Gray-white matter differentiation otherwise maintained. No mass lesion, midline shift or mass effect. Mild ventricular prominence related global parenchymal volume loss of hydrocephalus. No extra-axial fluid collection. Major dural sinuses are grossly patent. Pituitary gland suprasellar region normal. Midline structures intact and normal. Vascular: Major intracranial vascular flow voids are maintained. Skull and upper cervical spine: Craniocervical junction within normal limits. Upper cervical  spine normal. Bone marrow signal intensity within normal limits. No scalp soft tissue abnormality. Sinuses/Orbits: Globes and orbital soft tissues within normal limits. Patient status post cataract extraction bilaterally. Paranasal sinuses are clear. No significant mastoid effusion. Inner ear structures normal. Other: None. MRA HEAD FINDINGS ANTERIOR CIRCULATION: Study mildly degraded by motion artifact. Distal cervical segments of the internal carotid arteries are patent with antegrade flow. Petrous, cavernous, and supraclinoid segments patent without high-grade stenosis. Origin of the ophthalmic arteries patent. ICA termini widely patent. A1 segments, anterior communicating artery common anterior cerebral arteries grossly patent without high-grade stenosis. Mild atheromatous  irregularity within the M1 segments without high-grade stenosis. MCA bifurcations within normal limits. No proximal M2 occlusion. Distal small vessel atheromatous irregularity throughout the MCA branches bilaterally. POSTERIOR CIRCULATION: Vertebral arteries patent to the vertebrobasilar junction without stenosis. Left vertebral artery slightly dominant. Posterior inferior cerebral arteries not well evaluated on this exam. Basilar artery widely patent to its distal aspect without stenosis. Superior cerebral arteries patent bilaterally. Both of the posterior cerebral arteries primarily supplied via the basilar. Scattered atheromatous irregularity throughout the PCAs bilaterally with associated moderate stenosis on the left with a more severe distal right P2 stenosis noted. Distal right PCA is somewhat attenuated as compared to the left. No aneurysm. IMPRESSION: MRI HEAD IMPRESSION 1. 2.7 x 2.7 x 3.0 cm acute ischemic right basal ganglia infarct. Associated mild petechial hemorrhage without frank hemorrhagic transformation. 2. No other acute intracranial abnormality. 3. Age-related cerebral atrophy with mild chronic small vessel ischemic disease with scattered remote lacunar infarcts involving the deep gray nuclei, periventricular white matter, and bilateral cerebellar hemispheres. MRA HEAD IMPRESSION 1. Negative intracranial MRA for large vessel occlusion. 2. Moderate intracranial atherosclerotic change, most pronounced within the bilateral PCAs with there are moderate to severe bilateral P2 stenoses, right greater than left. 3. Distal small vessel atheromatous irregularity. Electronically Signed   By: Jeannine Boga M.D.   On: 01/17/2018 05:54   Mr [name] Head Wo Contrast  Result Date: 01/17/2018 CLINICAL DATA:  Initial evaluation for worsening generalized weakness and confusion. EXAM: MRI HEAD WITHOUT CONTRAST MRA HEAD WITHOUT CONTRAST TECHNIQUE: Multiplanar, multiecho pulse sequences of the brain and  surrounding structures were obtained without intravenous contrast. Angiographic images of the head were obtained using MRA technique without contrast. COMPARISON:  Prior CT from 01/16/2018. FINDINGS: MRI HEAD FINDINGS Brain: Generalized age related cerebral volume loss. Patchy T2/FLAIR hyperintensity within the periventricular and deep white matter both cerebral hemispheres, most consistent with chronic small vessel ischemic disease, mild for age. Chronic microvascular changes present within the pons. Few scattered remote lacunar infarcts present within the bilateral basal ganglia and right thalamus as well as the periventricular white matter. Scattered remote bilateral cerebellar infarcts noted. Acute ischemic infarct involving the right caudate, anterior limb of the right internal capsule, and right lentiform nucleus present. Infarct measures 2.7 x 2.7 x 3.0 cm in size. Mild localized edema without significant mass effect. Few scattered foci of petechial hemorrhage present within the area of infarction (series 16001, image 33). No frank hemorrhagic transformation. No other evidence for acute or subacute ischemia. Gray-white matter differentiation otherwise maintained. No mass lesion, midline shift or mass effect. Mild ventricular prominence related global parenchymal volume loss of hydrocephalus. No extra-axial fluid collection. Major dural sinuses are grossly patent. Pituitary gland suprasellar region normal. Midline structures intact and normal. Vascular: Major intracranial vascular flow voids are maintained. Skull and upper cervical spine: Craniocervical junction within normal limits. Upper cervical spine normal. Bone marrow signal intensity  within normal limits. No scalp soft tissue abnormality. Sinuses/Orbits: Globes and orbital soft tissues within normal limits. Patient status post cataract extraction bilaterally. Paranasal sinuses are clear. No significant mastoid effusion. Inner ear structures normal.  Other: None. MRA HEAD FINDINGS ANTERIOR CIRCULATION: Study mildly degraded by motion artifact. Distal cervical segments of the internal carotid arteries are patent with antegrade flow. Petrous, cavernous, and supraclinoid segments patent without high-grade stenosis. Origin of the ophthalmic arteries patent. ICA termini widely patent. A1 segments, anterior communicating artery common anterior cerebral arteries grossly patent without high-grade stenosis. Mild atheromatous irregularity within the M1 segments without high-grade stenosis. MCA bifurcations within normal limits. No proximal M2 occlusion. Distal small vessel atheromatous irregularity throughout the MCA branches bilaterally. POSTERIOR CIRCULATION: Vertebral arteries patent to the vertebrobasilar junction without stenosis. Left vertebral artery slightly dominant. Posterior inferior cerebral arteries not well evaluated on this exam. Basilar artery widely patent to its distal aspect without stenosis. Superior cerebral arteries patent bilaterally. Both of the posterior cerebral arteries primarily supplied via the basilar. Scattered atheromatous irregularity throughout the PCAs bilaterally with associated moderate stenosis on the left with a more severe distal right P2 stenosis noted. Distal right PCA is somewhat attenuated as compared to the left. No aneurysm. IMPRESSION: MRI HEAD IMPRESSION 1. 2.7 x 2.7 x 3.0 cm acute ischemic right basal ganglia infarct. Associated mild petechial hemorrhage without frank hemorrhagic transformation. 2. No other acute intracranial abnormality. 3. Age-related cerebral atrophy with mild chronic small vessel ischemic disease with scattered remote lacunar infarcts involving the deep gray nuclei, periventricular white matter, and bilateral cerebellar hemispheres. MRA HEAD IMPRESSION 1. Negative intracranial MRA for large vessel occlusion. 2. Moderate intracranial atherosclerotic change, most pronounced within the bilateral PCAs with  there are moderate to severe bilateral P2 stenoses, right greater than left. 3. Distal small vessel atheromatous irregularity. Electronically Signed   By: Jeannine Boga M.D.   On: 01/17/2018 05:54    Microbiology: No results found for this or any previous visit (from the past 240 hour(s)).   Labs: Basic Metabolic Panel: Recent Labs  Lab 01/16/18 1730  NA 140  K 4.6  CL 104  CO2 26  GLUCOSE 103*  BUN 18  CREATININE 1.27*  CALCIUM 8.7*   Liver Function Tests: No results for input(s): AST, ALT, ALKPHOS, BILITOT, PROT, ALBUMIN in the last 168 hours. No results for input(s): LIPASE, AMYLASE in the last 168 hours. No results for input(s): AMMONIA in the last 168 hours. CBC: Recent Labs  Lab 01/16/18 1730  WBC 10.6*  NEUTROABS 7.3  HGB 13.6  HCT 42.4  MCV 100.5*  PLT 269   Cardiac Enzymes: Recent Labs  Lab 01/17/18 0121  TROPONINI <0.03   BNP: BNP (last 3 results) No results for input(s): BNP in the last 8760 hours.  ProBNP (last 3 results) No results for input(s): PROBNP in the last 8760 hours.  CBG: Recent Labs  Lab 01/20/18 0320  GLUCAP 198*    Signed:  Velvet Bathe MD.  [facility] 01/20/2018, 1:35 PM

## 2018-01-20 NOTE — Clinical Social Work Placement (Signed)
   CLINICAL SOCIAL WORK PLACEMENT  NOTE  Date:  01/20/2018  Patient Details  Name: [name] MRN: [id number] Date of Birth: 1932/03/22  Clinical Social Work is seeking post-discharge placement for this patient at the [place] level of care (*CSW will initial, date and re-position this form in  chart as items are completed):  Yes   Patient/family provided with [place] Work Department's list of facilities offering this level of care within the geographic area requested by the patient (or if unable, by the patient's family).  Yes   Patient/family informed of their freedom to choose among providers that offer the needed level of care, that participate in Medicare, Medicaid or managed care program needed by the patient, have an available bed and are willing to accept the patient.  Yes   Patient/family informed of [facility]'s ownership interest in [facility] and [facility], as well as of the fact that they are under no obligation to receive care at these facilities.  PASRR submitted to EDS on       PASRR number received on       Existing PASRR number confirmed on 01/19/18     FL2 transmitted to all facilities in geographic area requested by pt/family on       FL2 transmitted to all facilities within larger geographic area on 01/19/18     Patient informed that his/her managed care company has contracts with or will negotiate with certain facilities, including the following:  [employer]     Yes   Patient/family informed of bed offers received.  Patient chooses bed at [facility]     Physician recommends and patient chooses bed at [facility]    Patient to be transferred to [facility] on 01/20/18.  Patient to be transferred to facility by [name] (daughter) [phone]     Patient family notified on 01/20/18 of transfer.  Name of family member notified:  [name]      PHYSICIAN       Additional Comment:      _______________________________________________ Ernesto Rutherford, [place] Work 01/20/2018, 3:42 PM

## 2018-01-20 NOTE — Progress Notes (Signed)
Dr Wendee Beavers notified the 10 beats of V. Tach on heart monitor.  No special ordered.  To discontinue telemerty.

## 2018-01-20 NOTE — Social Work (Signed)
Patient is medically stable and ready for discharge. Mrs. [name] [phone]) contacted and  informed of discharge. Per Mrs. Bondy-Cumming, she will pick up and transport patient to [facility]. CSW intern signing off as no other social work intervention services needed.   Massie Kluver, [place] intern

## 2018-01-20 NOTE — Progress Notes (Signed)
Patient left to [facility] for short term rehab.  Daughter is with the patient will transport patient to the facility. Patient is alert oriented X4 , denies any discomfort upon discharge.

## 2018-01-21 DIAGNOSIS — E785 Hyperlipidemia, unspecified: Secondary | ICD-10-CM | POA: Diagnosis not present

## 2018-01-21 DIAGNOSIS — G8194 Hemiplegia, unspecified affecting left nondominant side: Secondary | ICD-10-CM | POA: Diagnosis not present

## 2018-01-21 DIAGNOSIS — Z8673 Personal history of transient ischemic attack (TIA), and cerebral infarction without residual deficits: Secondary | ICD-10-CM | POA: Diagnosis not present

## 2018-01-21 DIAGNOSIS — I10 Essential (primary) hypertension: Secondary | ICD-10-CM | POA: Diagnosis not present

## 2018-01-21 DIAGNOSIS — N400 Enlarged prostate without lower urinary tract symptoms: Secondary | ICD-10-CM | POA: Diagnosis not present

## 2018-01-21 DIAGNOSIS — R2681 Unsteadiness on feet: Secondary | ICD-10-CM | POA: Diagnosis not present

## 2018-01-22 DIAGNOSIS — L03116 Cellulitis of left lower limb: Secondary | ICD-10-CM | POA: Diagnosis not present

## 2018-01-23 DIAGNOSIS — N189 Chronic kidney disease, unspecified: Secondary | ICD-10-CM | POA: Diagnosis not present

## 2018-01-23 DIAGNOSIS — I10 Essential (primary) hypertension: Secondary | ICD-10-CM | POA: Diagnosis not present

## 2018-01-23 DIAGNOSIS — N400 Enlarged prostate without lower urinary tract symptoms: Secondary | ICD-10-CM | POA: Diagnosis not present

## 2018-01-23 DIAGNOSIS — Z8673 Personal history of transient ischemic attack (TIA), and cerebral infarction without residual deficits: Secondary | ICD-10-CM | POA: Diagnosis not present

## 2018-01-27 DIAGNOSIS — L03116 Cellulitis of left lower limb: Secondary | ICD-10-CM | POA: Diagnosis not present

## 2018-01-27 NOTE — Care Management Important Message (Signed)
Important Message  Patient Details  Name: [name] MRN: [id number] Date of Birth: 1932/05/19   Medicare Important Message Given:  Yes    Kordelia Severin Montine Circle 01/27/2018, 2:31 PM

## 2018-01-28 DIAGNOSIS — I6930 Unspecified sequelae of cerebral infarction: Secondary | ICD-10-CM | POA: Diagnosis not present

## 2018-01-28 DIAGNOSIS — R2681 Unsteadiness on feet: Secondary | ICD-10-CM | POA: Diagnosis not present

## 2018-01-28 DIAGNOSIS — L03116 Cellulitis of left lower limb: Secondary | ICD-10-CM | POA: Diagnosis not present

## 2018-01-28 DIAGNOSIS — L309 Dermatitis, unspecified: Secondary | ICD-10-CM | POA: Diagnosis not present

## 2018-01-28 DIAGNOSIS — L270 Generalized skin eruption due to drugs and medicaments taken internally: Secondary | ICD-10-CM | POA: Diagnosis not present

## 2018-01-29 DIAGNOSIS — L03116 Cellulitis of left lower limb: Secondary | ICD-10-CM | POA: Diagnosis not present

## 2018-01-29 DIAGNOSIS — L270 Generalized skin eruption due to drugs and medicaments taken internally: Secondary | ICD-10-CM | POA: Diagnosis not present

## 2018-01-29 DIAGNOSIS — L309 Dermatitis, unspecified: Secondary | ICD-10-CM | POA: Diagnosis not present

## 2018-02-03 DIAGNOSIS — I6930 Unspecified sequelae of cerebral infarction: Secondary | ICD-10-CM | POA: Diagnosis not present

## 2018-02-03 DIAGNOSIS — R2681 Unsteadiness on feet: Secondary | ICD-10-CM | POA: Diagnosis not present

## 2018-02-05 DIAGNOSIS — R2681 Unsteadiness on feet: Secondary | ICD-10-CM | POA: Diagnosis not present

## 2018-02-05 DIAGNOSIS — Z8673 Personal history of transient ischemic attack (TIA), and cerebral infarction without residual deficits: Secondary | ICD-10-CM | POA: Diagnosis not present

## 2018-02-05 DIAGNOSIS — I6930 Unspecified sequelae of cerebral infarction: Secondary | ICD-10-CM | POA: Diagnosis not present

## 2018-02-05 DIAGNOSIS — I10 Essential (primary) hypertension: Secondary | ICD-10-CM | POA: Diagnosis not present

## 2018-02-05 DIAGNOSIS — N400 Enlarged prostate without lower urinary tract symptoms: Secondary | ICD-10-CM | POA: Diagnosis not present

## 2018-02-05 DIAGNOSIS — E785 Hyperlipidemia, unspecified: Secondary | ICD-10-CM | POA: Diagnosis not present

## 2018-02-06 ENCOUNTER — Ambulatory Visit: Payer: Medicare Other | Admitting: Podiatry

## 2018-02-07 DIAGNOSIS — I69398 Other sequelae of cerebral infarction: Secondary | ICD-10-CM | POA: Diagnosis not present

## 2018-02-07 DIAGNOSIS — R2689 Other abnormalities of gait and mobility: Secondary | ICD-10-CM | POA: Diagnosis not present

## 2018-02-07 DIAGNOSIS — I10 Essential (primary) hypertension: Secondary | ICD-10-CM | POA: Diagnosis not present

## 2018-02-07 DIAGNOSIS — Z7902 Long term (current) use of antithrombotics/antiplatelets: Secondary | ICD-10-CM | POA: Diagnosis not present

## 2018-02-07 DIAGNOSIS — E785 Hyperlipidemia, unspecified: Secondary | ICD-10-CM | POA: Diagnosis not present

## 2018-02-07 DIAGNOSIS — M6281 Muscle weakness (generalized): Secondary | ICD-10-CM | POA: Diagnosis not present

## 2018-02-10 ENCOUNTER — Telehealth: Payer: Self-pay | Admitting: Family Medicine

## 2018-02-10 DIAGNOSIS — I10 Essential (primary) hypertension: Secondary | ICD-10-CM | POA: Diagnosis not present

## 2018-02-10 DIAGNOSIS — E785 Hyperlipidemia, unspecified: Secondary | ICD-10-CM | POA: Diagnosis not present

## 2018-02-10 DIAGNOSIS — M6281 Muscle weakness (generalized): Secondary | ICD-10-CM | POA: Diagnosis not present

## 2018-02-10 DIAGNOSIS — Z7902 Long term (current) use of antithrombotics/antiplatelets: Secondary | ICD-10-CM | POA: Diagnosis not present

## 2018-02-10 DIAGNOSIS — I69398 Other sequelae of cerebral infarction: Secondary | ICD-10-CM | POA: Diagnosis not present

## 2018-02-10 DIAGNOSIS — R2689 Other abnormalities of gait and mobility: Secondary | ICD-10-CM | POA: Diagnosis not present

## 2018-02-10 NOTE — Telephone Encounter (Signed)
Copied from [place] [phone]. Topic: General - Other >> Feb 10, 2018 12:10 PM Margot Ables wrote: Reason for CRM: Pt had a fall yesterday 8:00am, he was on the floor on his bottom for several hours until his housekeeper helped him up, no injuries to report, pt does not indicate that he is sore, pt didn't feel need to go to ER. Back of belt caught on corner of kitchen cabinet and he lost balance.

## 2018-02-10 NOTE — Telephone Encounter (Signed)
Copied from [place] [id number]. Topic: Quick Communication - See Telephone Encounter >> Feb 10, 2018  4:45 PM Arletha Grippe wrote: CRM for notification. See Telephone encounter for: 02/10/18. Will from [employer] called  Verbal order  Occupational therapy  2 week 3  Cb [phone]

## 2018-02-11 ENCOUNTER — Telehealth: Payer: Self-pay | Admitting: Family Medicine

## 2018-02-11 NOTE — Telephone Encounter (Signed)
Call to pt to f/u on message r/t fall yesterday.  Pt denies injury, states his shirt was caught on the counter.  Denies any bruising, injury.  Did not strike head.  Denies any needs at this time.    Call to Will.  Provided VO for therapy as requested.

## 2018-02-11 NOTE — Telephone Encounter (Signed)
Pt needs office visit

## 2018-02-11 NOTE — Telephone Encounter (Signed)
Copied from [place] [phone]. Topic: General - Other >> Feb 11, 2018 12:24 PM Oneta Rack wrote: Osvaldo Human name: Elizabeth Haff  Relation to pt: son  Call back number: [phone]   Reason for call:  Son calling on behalf of patient inquiring about refilling or d/c clopidogrel (PLAVIX) 75 MG tablet, medication was prescribed in the hospital, please advise  >> Feb 11, 2018 12:26 PM Oneta Rack wrote: Osvaldo Human name: Allison Silva  Relation to pt: son  Call back number: [phone]   Reason for call:  Son calling on behalf of patient inquiring about refilling or d/c clopidogrel (PLAVIX) 75 MG tablet, medication was prescribed in the hospital

## 2018-02-11 NOTE — Telephone Encounter (Signed)
>>   Feb 11, 2018 12:26 PM Oneta Rack wrote: Osvaldo Human name: [name]  Relation to pt: son  Call back number: [phone]   Reason for call:  Son calling on behalf of patient inquiring about refilling or d/c clopidogrel (PLAVIX) 75 MG tablet, medication was prescribed in the hospital, please advise

## 2018-02-12 ENCOUNTER — Telehealth: Payer: Self-pay | Admitting: Family Medicine

## 2018-02-12 DIAGNOSIS — E785 Hyperlipidemia, unspecified: Secondary | ICD-10-CM | POA: Diagnosis not present

## 2018-02-12 DIAGNOSIS — M6281 Muscle weakness (generalized): Secondary | ICD-10-CM | POA: Diagnosis not present

## 2018-02-12 DIAGNOSIS — I69398 Other sequelae of cerebral infarction: Secondary | ICD-10-CM | POA: Diagnosis not present

## 2018-02-12 DIAGNOSIS — Z7902 Long term (current) use of antithrombotics/antiplatelets: Secondary | ICD-10-CM | POA: Diagnosis not present

## 2018-02-12 DIAGNOSIS — I10 Essential (primary) hypertension: Secondary | ICD-10-CM | POA: Diagnosis not present

## 2018-02-12 DIAGNOSIS — R2689 Other abnormalities of gait and mobility: Secondary | ICD-10-CM | POA: Diagnosis not present

## 2018-02-12 NOTE — Telephone Encounter (Signed)
Phone call to [name] at [facility] to gather more information, unable to reach.   If [name] calls back, please transfer to nurse triage to take more information.

## 2018-02-12 NOTE — Telephone Encounter (Signed)
Copied from [place] [id number]. Topic: General - Other >> Feb 12, 2018  9:40 AM Margot Ables wrote: Pt had another fall 02/11/18 approximately 8:00am. Pt did have walker with him but fell backwards. Pt bumped head on the wall and has a friction abrasion. No pain reported.

## 2018-02-13 DIAGNOSIS — E785 Hyperlipidemia, unspecified: Secondary | ICD-10-CM | POA: Diagnosis not present

## 2018-02-13 DIAGNOSIS — Z7902 Long term (current) use of antithrombotics/antiplatelets: Secondary | ICD-10-CM | POA: Diagnosis not present

## 2018-02-13 DIAGNOSIS — M6281 Muscle weakness (generalized): Secondary | ICD-10-CM | POA: Diagnosis not present

## 2018-02-13 DIAGNOSIS — R2689 Other abnormalities of gait and mobility: Secondary | ICD-10-CM | POA: Diagnosis not present

## 2018-02-13 DIAGNOSIS — I69398 Other sequelae of cerebral infarction: Secondary | ICD-10-CM | POA: Diagnosis not present

## 2018-02-13 DIAGNOSIS — I10 Essential (primary) hypertension: Secondary | ICD-10-CM | POA: Diagnosis not present

## 2018-02-14 ENCOUNTER — Ambulatory Visit (INDEPENDENT_AMBULATORY_CARE_PROVIDER_SITE_OTHER): Payer: Medicare Other | Admitting: Family Medicine

## 2018-02-14 ENCOUNTER — Other Ambulatory Visit: Payer: Self-pay

## 2018-02-14 ENCOUNTER — Encounter: Payer: Self-pay | Admitting: Family Medicine

## 2018-02-14 VITALS — BP 110/70 | HR 71 | Temp 98.0°F | Resp 16 | Ht 67.0 in | Wt 144.0 lb

## 2018-02-14 DIAGNOSIS — R600 Localized edema: Secondary | ICD-10-CM

## 2018-02-14 DIAGNOSIS — I69111 Memory deficit following nontraumatic intracerebral hemorrhage: Secondary | ICD-10-CM

## 2018-02-14 DIAGNOSIS — N3944 Nocturnal enuresis: Secondary | ICD-10-CM | POA: Diagnosis not present

## 2018-02-14 DIAGNOSIS — H918X3 Other specified hearing loss, bilateral: Secondary | ICD-10-CM | POA: Diagnosis not present

## 2018-02-14 DIAGNOSIS — E785 Hyperlipidemia, unspecified: Secondary | ICD-10-CM | POA: Diagnosis not present

## 2018-02-14 DIAGNOSIS — I10 Essential (primary) hypertension: Secondary | ICD-10-CM | POA: Diagnosis not present

## 2018-02-14 DIAGNOSIS — H6123 Impacted cerumen, bilateral: Secondary | ICD-10-CM

## 2018-02-14 DIAGNOSIS — R269 Unspecified abnormalities of gait and mobility: Secondary | ICD-10-CM

## 2018-02-14 DIAGNOSIS — I63311 Cerebral infarction due to thrombosis of right middle cerebral artery: Secondary | ICD-10-CM

## 2018-02-14 DIAGNOSIS — I493 Ventricular premature depolarization: Secondary | ICD-10-CM

## 2018-02-14 DIAGNOSIS — L2084 Intrinsic (allergic) eczema: Secondary | ICD-10-CM

## 2018-02-14 DIAGNOSIS — R000 Tachycardia, unspecified: Secondary | ICD-10-CM

## 2018-02-14 DIAGNOSIS — G9340 Encephalopathy, unspecified: Secondary | ICD-10-CM

## 2018-02-14 MED ORDER — METOPROLOL SUCCINATE ER 50 MG PO TB24: 50.0000 mg | ORAL_TABLET | Freq: Every day | ORAL | 1 refills | Status: DC

## 2018-02-14 MED ORDER — PRAVASTATIN SODIUM 40 MG PO TABS: 40.0000 mg | ORAL_TABLET | Freq: Every day | ORAL | 1 refills | Status: DC

## 2018-02-14 MED ORDER — ALENDRONATE SODIUM 70 MG PO TABS: 70.0000 mg | ORAL_TABLET | ORAL | 11 refills | Status: DC

## 2018-02-14 MED ORDER — FLUOCINONIDE-E 0.05 % EX CREA: 1.0000 "application " | TOPICAL_CREAM | Freq: Two times a day (BID) | CUTANEOUS | 5 refills | Status: DC

## 2018-02-14 MED ORDER — PREDNISONE 20 MG PO TABS: 20.0000 mg | ORAL_TABLET | Freq: Two times a day (BID) | ORAL | 0 refills | Status: DC

## 2018-02-14 NOTE — Patient Instructions (Addendum)
   CHANGE FUROSEMIDE/LASIX 20MG  TO ONCE DAILY ONLY AS NEEDED FOR LEG SWELLING; DO NOT TAKE EVERY DAY. STOP PLAVIX. START ASPIRIN 81MG  ONE TABLET DAILY.  BABY ASPIRIN.  IF you received an x-ray today, you will receive an invoice from [facility]. Please contact [facility] at [phone] with questions or concerns regarding your invoice.   IF you received labwork today, you will receive an invoice from [place] [facility]. Please contact [facility] at [phone] with questions or concerns regarding your invoice.   Our billing staff will not be able to assist you with questions regarding bills from these companies.  You will be contacted with the lab results as soon as they are available. The fastest way to get your results is to activate your My Chart account. Instructions are located on the last page of this paperwork. If you have not heard from [place] regarding the results in 2 weeks, please contact this office.

## 2018-02-14 NOTE — Progress Notes (Signed)
Subjective:    Patient ID: [name], male    DOB: July 30, 1932, 84 y.o.   MRN: [id number]  02/14/2018  Hospitalization Follow-up (Stroke; needs Plavix rx)    HPI This 84 y.o. male presents with daughter for [name] for confusion and diagnosed with CVA.  Details of discharge summary include the following:    Admit date: 01/16/2018 Discharge date: 01/20/2018 Time spent: > 35 minutes Recommendations for Outpatient Follow-up:  1. Ensure f/u with neurologist. 2. Pt is to go on dual antiplatelet therapy for 3 weeks then transition to either aspirin or plavix alone see details below.  Discharge Diagnoses:  Active Problems:   Dyslipidemia   Benign prostatic hyperplasia   Chronic renal insufficiency   Benign essential HTN   Acute encephalopathy   CVA (cerebral vascular accident) [facility]) Discharge Condition: stable Diet recommendation: Heart healthy    Filed Weights   01/17/18 0100  Weight: 65.2 kg (143 lb 11.8 oz)   History of present illness:  84 y.o.malewith medical history significant of hypertension, hypercholesterolemia, osteoporosis,BPH,history of hip fracture ,CKD Presented withconfusion. Dx with new cva Hospital Course:  ASSESSMENT/PLAN Wesley Hodgeis a 84 y.o.malewith history of hyperlipidemia,previous strokes by imaging andhypertensionpresenting with confusion.Hedidnot receive IV t-PA due tounclear time of onset.  Stroke-RtBG/caudate head acuteinfarct - small vessel disease.  Resultantback to baseline  CT head-subacute stroke in the right caudate head and adjacent lentiform nucleus  MRIhead-acute ischemic right BGinfarct, remote bilateral cerebellum and left BG/caudate head infarcts  MRAhead-moderate to severe bilateral P2 stenoses, right greater than left.Patent right VA and the basal artery.  CarotidDoppler-RtVAflow exhibits an abnormal waveform   LDL- 73  HgbA1c -5.0  VTE prophylaxis-  SCDs  Fall precautions  Aspiration precautions  Diet Heart Room service appropriate? Yes; Fluid consistency: Thin  No antithromboticprior to admission, now on aspirin 81 mg daily and clopidogrel 75 mg daily.Continue DAPTfor 3 weeks and then aspirin or Plavix alone.  Patient counseled to be compliant withhisantithrombotic medications  Ongoing aggressive stroke risk factor management  Disposition: SNF  Hypertension  Blood pressure somewhat low at times- 500 cc NS bolus yesterday  Permissive hypertension (OK if <220/120) but gradually normalize in 5-7 days  Long-term BP goal normotensive Hyperlipidemia  Lipid lowering medication KKX:[id number] 40 mg daily  LDL73, goal < 70  Current lipid lowering medication:Pravachol 40 mg daily  Continue statin at discharge Other Stroke Risk Factors  Advanced age  Hx strokes by imaging Other Active Problems  Mildly elevated creatinine - 1.27 - Pt has CKD stage 2 or 3  Agree with neuro notes above.   Procedures: Ct Head Wo Contrast 01/16/2018 IMPRESSION:  1. Findings are most consistent with a subacute stroke in the right caudate head and adjacent lentiform nucleus. Consider MRI for further evaluation/aging.  2. No evidence of cortical stroke or intracranial hemorrhage.  Mr [name] Head Wo Contrast 01/17/2018 IMPRESSION:  MRI HEAD  IMPRESSION  1.2.7 x 2.7 x 3.0 cm acute ischemic right basal ganglia infarct. Associated mild petechial hemorrhage without frank hemorrhagic transformation.  2. No other acute intracranial abnormality.  3. Age-related cerebral atrophy with mild chronic small vessel ischemic disease with scattered remotelacunar infarcts involving the deep gray nuclei, periventricular white matter, and bilateral cerebellarhemispheres.  MRA HEAD  IMPRESSION  1. Negative intracranial MRA for large vessel occlusion. 2. Moderate intracranial atherosclerotic change, most pronounced within the bilateral  PCAswith there are moderate to severe bilateral P2 stenoses, right greater than left. 3. Distal small vessel atheromatous irregularity. Bilateral Carotid Dopplers 01/17/2018  1-39% ICA stenosis.  Right vertebral artery flow exhibits an abnormal waveform.  Left vertebral artery flow is antegrade.  Consultations:  Neurology STOP taking these medications   lisinopril 5 MG tablet Commonly known as:  PRINIVIL,ZESTRIL     TAKE these medications   alendronate 70 MG tablet Commonly known as:  FOSAMAX Take 1 tablet (70 mg total) by mouth every 7 (seven) days. Take with a full glass of water on an empty stomach.   aspirin 81 MG EC tablet Take 1 tablet (81 mg total) by mouth daily. Start taking on:  01/21/2018   clobetasol ointment 0.05 % Commonly known as:  TEMOVATE Apply 1 application 2 (two) times daily topically.   clopidogrel 75 MG tablet Commonly known as:  PLAVIX Take 1 tablet (75 mg total) by mouth daily. Start taking on:  01/21/2018   eucerin cream Apply 1 application topically as needed for dry skin.   fluocinonide-emollient 0.05 % cream Commonly known as:  LIDEX-E Apply 1 application topically 2 (two) times daily.   furosemide 20 MG tablet Commonly known as:  LASIX Take 1 tablet (20 mg total) daily as needed by mouth.   metoprolol succinate 50 MG 24 hr tablet Commonly known as:  TOPROL-XL Take 1 tablet (50 mg total) daily by mouth. Take with or immediately following a meal.   pravastatin 40 MG tablet Commonly known as:  PRAVACHOL Take 1 tablet (40 mg total) daily by mouth.          Allergies  Allergen Reactions  . Seasonal Ic [Cholestatin]    Update: Balance is off since CVA; good and bad days.   Rehab facility/[facility]; duration 18 days.   Has been home for one week; two tumbles this week.   One was a freak accident; belt caught on cabinet. Second fall was with ambulation into living room. Hit back of head.  No loss of  consciousness.  No headaches.  No blurred vision; no nausea or vomiting .   Home alone; someone in morning and midday.  Someone checking at night. Home therapy physical twice week; occupational therapy once per week.   Juda aide provided to give bath.  Scrubbed patient well.   Housekeeper looks after pt.   Has forgotten to take medication this week.  Lifeline in place; did not have on for first fall. Pill box.  No longer spilling pills with opening bottles.  Lisinopril stopped because BP very low in hospital.  Low BP during admission 90/50. Has been taking Lasix every day; having some incontinence issues.  Enuresis during the night.  Swelling is stable and not bothersome; daughter wants to know if Lasix necessary. Ran out of Plavix; not taking ASA.   Hearing loss: requesting ear irrigation.   BP Readings from Last 3 Encounters:  03/11/18 118/72  02/24/18 121/78  02/14/18 110/70   Wt Readings from Last 3 Encounters:  03/11/18 146 lb (66.2 kg)  02/24/18 145 lb (65.8 kg)  02/14/18 144 lb (65.3 kg)   Immunization History  Administered Date(s) Administered  . Influenza, Seasonal, Injecte, Preservative Fre 10/14/2012  . Influenza,inj,Quad PF,6+ Mos 12/07/2014, 08/11/2015, 09/11/2016, 08/25/2017  . Pneumococcal Conjugate-13 06/03/2014  . Pneumococcal Polysaccharide-23 04/14/2007, 09/22/2015  . Tdap 04/14/2007, 09/25/2016  . Zoster 12/06/2011    Review of Systems  Constitutional: Negative for activity change, appetite change, chills, diaphoresis, fatigue, fever and unexpected weight change.  HENT: Positive for hearing loss. Negative for congestion, dental problem, drooling, ear discharge, ear pain, facial swelling, mouth sores, nosebleeds,  postnasal drip, rhinorrhea, sinus pressure, sneezing, sore throat, tinnitus, trouble swallowing and voice change.   Eyes: Negative for photophobia, pain, discharge, redness, itching and visual disturbance.  Respiratory: Negative for apnea, cough,  choking, chest tightness, shortness of breath, wheezing and stridor.   Cardiovascular: Negative for chest pain, palpitations and leg swelling.  Gastrointestinal: Negative for abdominal pain, blood in stool, constipation, diarrhea, nausea and vomiting.  Endocrine: Negative for cold intolerance, heat intolerance, polydipsia, polyphagia and polyuria.  Genitourinary: Negative for decreased urine volume, difficulty urinating, discharge, dysuria, enuresis, flank pain, frequency, genital sores, hematuria, penile pain, penile swelling, scrotal swelling, testicular pain and urgency.  Musculoskeletal: Positive for gait problem. Negative for arthralgias, back pain, joint swelling, myalgias, neck pain and neck stiffness.  Skin: Negative for color change, pallor, rash and wound.  Allergic/Immunologic: Negative for environmental allergies, food allergies and immunocompromised state.  Neurological: Negative for dizziness, tremors, seizures, syncope, facial asymmetry, speech difficulty, weakness, light-headedness, numbness and headaches.  Hematological: Negative for adenopathy. Does not bruise/bleed easily.  Psychiatric/Behavioral: Positive for decreased concentration. Negative for agitation, behavioral problems, confusion, dysphoric mood, hallucinations, self-injury, sleep disturbance and suicidal ideas. The patient is not nervous/anxious and is not hyperactive.     Past Medical History:  Diagnosis Date  . Allergy    seasonal  fall  . Cancer ([place])    Basal cell carcinoma; multiple  . Eczema   . Hyperlipidemia   . Hypertension   . Osteoporosis   . Stroke [facility])    Past Surgical History:  Procedure Laterality Date  . CATARACT EXTRACTION, BILATERAL  06/29/2015  . INTRAMEDULLARY (IM) NAIL INTERTROCHANTERIC Left 03/30/2017   Procedure: INTRAMEDULLARY (IM) NAIL INTERTROCHANTRIC;  Surgeon: [name], MD;  Location: [facility];  Service: Orthopedics;  Laterality: Left;  . MOHS SURGERY    . TONSILLECTOMY   1940 maybe   Allergies  Allergen Reactions  . Seasonal Ic [Cholestatin]    Current Outpatient Medications on File Prior to Visit  Medication Sig Dispense Refill  . aspirin EC 81 MG EC tablet Take 1 tablet (81 mg total) by mouth daily. 30 tablet 0  . clobetasol ointment (TEMOVATE) 5.63 % Apply 1 application 2 (two) times daily topically. 60 g prn  . furosemide (LASIX) 20 MG tablet Take 1 tablet (20 mg total) daily as needed by mouth. 90 tablet 1   No current facility-administered medications on file prior to visit.    Social History   Socioeconomic History  . Marital status: Married    Spouse name: Not on file  . Number of children: Not on file  . Years of education: Not on file  . Highest education level: Not on file  Occupational History  . Occupation: retired  Scientific laboratory technician  . Financial resource strain: Not on file  . Food insecurity:    Worry: Not on file    Inability: Not on file  . Transportation needs:    Medical: Not on file    Non-medical: Not on file  Tobacco Use  . Smoking status: Never Smoker  . Smokeless tobacco: Never Used  Substance and Sexual Activity  . Alcohol use: No    Comment: occas  . Drug use: No  . Sexual activity: Not Currently  Lifestyle  . Physical activity:    Days per week: Not on file    Minutes per session: Not on file  . Stress: Not on file  Relationships  . Social connections:    Talks on phone: Not on file  Gets together: Not on file    Attends religious service: Not on file    Active member of club or organization: Not on file    Attends meetings of clubs or organizations: Not on file    Relationship status: Not on file  . Intimate partner violence:    Fear of current or ex partner: Not on file    Emotionally abused: Not on file    Physically abused: Not on file    Forced sexual activity: Not on file  Other Topics Concern  . Not on file  Social History Narrative   Marital status: divorced; good friend with ex-wife; not  dating      Children: 2 children; 4 grandchildren; no gg      Lives:  Alone in house; daughter six blocks away      Employment: college professor; retired in 1997; history; [place]:  Never      Alcohol: tequila loves but won't buy it.      Exercise:  As much as can; previous competitive biking.      ADLs: quit mowing grass in 2017; no assistant devices; cooks and cleans and pays bills.  No driving; HAS NEVER DRIVEN; has always ridden bike.  Daughter takes patient to grocery store and to appointments.  Daughter drives patient to grocery store.      Advanced Directives:  No living; DNR; do not resuscitate.  HCPOA: [name] Cummings   Family History  Problem Relation Age of Onset  . Arthritis Mother   . Heart disease Mother 43  . Hypertension Father        Objective:    BP 110/70   Pulse 71   Temp 98 F (36.7 C) (Oral)   Resp 16   Ht 5\' 7"  (1.702 m)   Wt 144 lb (65.3 kg)   SpO2 95%   BMI 22.55 kg/m  Physical Exam  Constitutional: He is oriented to person, place, and time. He appears well-developed and well-nourished. No distress.  HENT:  Head: Normocephalic and atraumatic.  Right Ear: External ear normal.  Left Ear: External ear normal.  Nose: Nose normal.  Mouth/Throat: Oropharynx is clear and moist.  Bilateral cerumen impaction  Eyes: Pupils are equal, round, and reactive to light. Conjunctivae and EOM are normal.  Neck: Normal range of motion. Neck supple. Carotid bruit is not present. No thyromegaly present.  Cardiovascular: Normal rate, regular rhythm, normal heart sounds and intact distal pulses. Exam reveals no gallop and no friction rub.  No murmur heard. Trace to 1+ pitting edema bilateral lower extremities.  Pulmonary/Chest: Effort normal and breath sounds normal. He has no wheezes. He has no rales.  Abdominal: Soft. Bowel sounds are normal. He exhibits no distension and no mass. There is no tenderness. There is no rebound and no guarding.   Musculoskeletal: He exhibits edema.       Right shoulder: Normal.       Left shoulder: Normal.       Cervical back: Normal.  Lymphadenopathy:    He has no cervical adenopathy.  Neurological: He is alert and oriented to person, place, and time. He has normal reflexes. No cranial nerve deficit or sensory deficit. He exhibits normal muscle tone. Coordination normal.  Skin: Skin is warm and dry. No rash noted. He is not diaphoretic.  Diffusely dry and scaling skin throughout.  Psychiatric: He has a normal mood and affect. His behavior is normal. Judgment and thought content normal.  No results found. Depression screen [facility] 2/9 03/11/2018 02/14/2018 01/16/2018 09/10/2017 08/25/2017  Decreased Interest 0 0 0 0 0  Down, Depressed, Hopeless 0 0 0 0 0  PHQ - 2 Score 0 0 0 0 0   Fall Risk  03/11/2018 02/24/2018 02/14/2018 01/16/2018 09/10/2017  Falls in the past year? Yes Yes Yes No Yes  Comment - - - - -  Number falls in past yr: 2 or more 2 or more 1 - 1  Injury with Fall? - No No - Yes  Comment - - - - -  Risk Factor Category  - - - - -  Risk for fall due to : - - - - -  Follow up - - - - -        Assessment & Plan:   1. Cerebrovascular accident (CVA) due to thrombosis of right middle cerebral artery ([place])   2. Acute encephalopathy   3. Essential hypertension   4. Tachycardia   5. Bilateral impacted cerumen   6. PVC (premature ventricular contraction)   7. Intrinsic eczema   8. Nocturnal enuresis   9. Leg edema   10. Dyslipidemia   11. Abnormality of gait     Status post admission for acute CVA with a right middle cerebral artery: Admission records reviewed in detail.  Status post rehabilitation.  Now has been discharged to home with support from daughter.  No focal neurological deficits other than memory loss.  Recommend aspirin 81 mg 1 tablet daily.  Has completed Plavix therapy.  Suffered with acute encephalopathy with acute CVA.  Encephalopathy has resolved he is suffering with  mild memory loss.  Hypertension: Lisinopril discontinued during admission due to hypotension.  Metoprolol and Lasix therapy continued at discharge.  Recommend discontinuing Lasix therapy and using Lasix only as needed for swelling treated we will continue metoprolol as suffers with tachycardia and frequent PVCs in the past.  Patient has refused further cardiac work-up in the past.    Dyslipidemia: Controlled at this time on current regimen.  Eczema: Severe.  Agreeable to prednisone 1 prescription for use in the future due to severity of symptoms.  Continue topical steroid cream with Eucerin.  Bilateral cerumen impaction: Status post ear irrigation bilaterally in office.  Provider/myself performed completion of cerumen removal.   Orders Placed This Encounter  Procedures  . Ear wax removal    Scheduling Instructions:     BILATERAL   Meds ordered this encounter  Medications  . predniSONE (DELTASONE) 20 MG tablet    Sig: Take 1 tablet (20 mg total) by mouth 2 (two) times daily with a meal. For 5 days then one tablet daily x 5 days    Dispense:  15 tablet    Refill:  0  . alendronate (FOSAMAX) 70 MG tablet    Sig: Take 1 tablet (70 mg total) by mouth every 7 (seven) days. Take with a full glass of water on an empty stomach.    Dispense:  4 tablet    Refill:  11  . metoprolol succinate (TOPROL-XL) 50 MG 24 hr tablet    Sig: Take 1 tablet (50 mg total) by mouth daily. Take with or immediately following a meal.    Dispense:  90 tablet    Refill:  1  . pravastatin (PRAVACHOL) 40 MG tablet    Sig: Take 1 tablet (40 mg total) by mouth daily.    Dispense:  90 tablet    Refill:  1  . fluocinonide-emollient (LIDEX-E)  0.05 % cream    Sig: Apply 1 application topically 2 (two) times daily.    Dispense:  30 g    Refill:  5    Return in about 1 month (around 03/14/2018) for recheck.   Marlyne Totaro Elayne Guerin, M.D. Primary Care at [facility] [address] [place], [place]  [address] [phone] phone [phone] fax

## 2018-02-16 DIAGNOSIS — E785 Hyperlipidemia, unspecified: Secondary | ICD-10-CM | POA: Diagnosis not present

## 2018-02-16 DIAGNOSIS — I10 Essential (primary) hypertension: Secondary | ICD-10-CM | POA: Diagnosis not present

## 2018-02-16 DIAGNOSIS — Z7902 Long term (current) use of antithrombotics/antiplatelets: Secondary | ICD-10-CM | POA: Diagnosis not present

## 2018-02-16 DIAGNOSIS — R2689 Other abnormalities of gait and mobility: Secondary | ICD-10-CM | POA: Diagnosis not present

## 2018-02-16 DIAGNOSIS — M6281 Muscle weakness (generalized): Secondary | ICD-10-CM | POA: Diagnosis not present

## 2018-02-16 DIAGNOSIS — I69398 Other sequelae of cerebral infarction: Secondary | ICD-10-CM | POA: Diagnosis not present

## 2018-02-17 ENCOUNTER — Other Ambulatory Visit: Payer: Self-pay | Admitting: Family Medicine

## 2018-02-18 ENCOUNTER — Other Ambulatory Visit: Payer: Self-pay | Admitting: Family Medicine

## 2018-02-18 ENCOUNTER — Other Ambulatory Visit: Payer: Self-pay

## 2018-02-18 DIAGNOSIS — E785 Hyperlipidemia, unspecified: Secondary | ICD-10-CM | POA: Diagnosis not present

## 2018-02-18 DIAGNOSIS — Z7902 Long term (current) use of antithrombotics/antiplatelets: Secondary | ICD-10-CM | POA: Diagnosis not present

## 2018-02-18 DIAGNOSIS — M6281 Muscle weakness (generalized): Secondary | ICD-10-CM | POA: Diagnosis not present

## 2018-02-18 DIAGNOSIS — R2689 Other abnormalities of gait and mobility: Secondary | ICD-10-CM | POA: Diagnosis not present

## 2018-02-18 DIAGNOSIS — I10 Essential (primary) hypertension: Secondary | ICD-10-CM | POA: Diagnosis not present

## 2018-02-18 DIAGNOSIS — I69398 Other sequelae of cerebral infarction: Secondary | ICD-10-CM | POA: Diagnosis not present

## 2018-02-18 MED ORDER — EUCERIN EX CREA: 1.0000 "application " | TOPICAL_CREAM | CUTANEOUS | 1 refills | Status: DC | PRN

## 2018-02-19 DIAGNOSIS — I10 Essential (primary) hypertension: Secondary | ICD-10-CM | POA: Diagnosis not present

## 2018-02-19 DIAGNOSIS — Z7902 Long term (current) use of antithrombotics/antiplatelets: Secondary | ICD-10-CM | POA: Diagnosis not present

## 2018-02-19 DIAGNOSIS — E785 Hyperlipidemia, unspecified: Secondary | ICD-10-CM | POA: Diagnosis not present

## 2018-02-19 DIAGNOSIS — R2689 Other abnormalities of gait and mobility: Secondary | ICD-10-CM | POA: Diagnosis not present

## 2018-02-19 DIAGNOSIS — I69398 Other sequelae of cerebral infarction: Secondary | ICD-10-CM | POA: Diagnosis not present

## 2018-02-19 DIAGNOSIS — M6281 Muscle weakness (generalized): Secondary | ICD-10-CM | POA: Diagnosis not present

## 2018-02-24 ENCOUNTER — Encounter: Payer: Self-pay | Admitting: Adult Health

## 2018-02-24 ENCOUNTER — Ambulatory Visit (INDEPENDENT_AMBULATORY_CARE_PROVIDER_SITE_OTHER): Payer: Medicare Other | Admitting: Adult Health

## 2018-02-24 VITALS — BP 121/78 | HR 70 | Ht 67.0 in | Wt 145.0 lb

## 2018-02-24 DIAGNOSIS — E785 Hyperlipidemia, unspecified: Secondary | ICD-10-CM

## 2018-02-24 DIAGNOSIS — R2689 Other abnormalities of gait and mobility: Secondary | ICD-10-CM | POA: Diagnosis not present

## 2018-02-24 DIAGNOSIS — I10 Essential (primary) hypertension: Secondary | ICD-10-CM | POA: Diagnosis not present

## 2018-02-24 DIAGNOSIS — I63511 Cerebral infarction due to unspecified occlusion or stenosis of right middle cerebral artery: Secondary | ICD-10-CM

## 2018-02-24 DIAGNOSIS — Z7902 Long term (current) use of antithrombotics/antiplatelets: Secondary | ICD-10-CM | POA: Diagnosis not present

## 2018-02-24 DIAGNOSIS — I739 Peripheral vascular disease, unspecified: Secondary | ICD-10-CM

## 2018-02-24 DIAGNOSIS — M6281 Muscle weakness (generalized): Secondary | ICD-10-CM | POA: Diagnosis not present

## 2018-02-24 DIAGNOSIS — I639 Cerebral infarction, unspecified: Secondary | ICD-10-CM | POA: Diagnosis not present

## 2018-02-24 DIAGNOSIS — I499 Cardiac arrhythmia, unspecified: Secondary | ICD-10-CM | POA: Diagnosis not present

## 2018-02-24 DIAGNOSIS — I69398 Other sequelae of cerebral infarction: Secondary | ICD-10-CM | POA: Diagnosis not present

## 2018-02-24 NOTE — Progress Notes (Signed)
[facility] [address] [place]. [place] [address] [phone]       OFFICE FOLLOW UP NOTE  [name] Date of Birth:  [birth year] Medical Record Number:  [id number]   Reason for Referral:  hospital stroke follow up  CHIEF COMPLAINT:  Chief Complaint  Patient presents with  . Follow-up    Stroke follow up hospital follow up was seen by Dr. Erlinda Hong    HPI: [name] is being seen today for initial visit in the office for right BG/caudate infarct on 01/16/18. History obtained from patient and chart review. Reviewed all radiology images and labs personally.  [name] is a 84 y.o. male with history of hyperlipidemia, previous strokes by imaging and hypertension presenting with confusion. He did not receive IV t-PA due to unclear time of onset.  CT head reviewed and showed subacute stroke in the right caudate head and adjacent lentiform nucleus.  MRI head reviewed and showed acute ischemic right BG infarct, remote bilateral cerebellum and left BG/buttock head infarcts.  MRA head showed moderate to severe bilateral PT stenosis with a right was greater than left.  Carotid Dopplers showed right VA flow exhibits an abnormal waveform.  2D echo showed an EF of 55 to 60% and negative for PFO.  LDL 73 and A1c satisfactory at 5.0.  Patient was not on antithrombotic prior to admission and recommended DAPT for 3 weeks and then aspirin or Plavix alone.  Patient was previously on Pravachol 40 mg for cholesterol and recommended to continue this at discharge. Patient discharged to SNF   Since discharge, patient has been doing well and is accompanied at todays appointment by his daughter. Continues to take aspirin without bleeding and minor bruising. Continues to take Pravachol without side effects of myalgias. BP today satisfactory at todays visit at 129/78. Continues home PT/OT and currently ambulates with a rolling walker at all times. Does continue to have slight memory issues such as  being more forgetful. Daughter thinks this has improved slightly since discharge. Continues to live alone and does have assistance with cleaning, meal prep and bathing. Denies new or worsening stroke/ TIA symptoms.   ROS:   14 system review of systems performed and negative with exception of rash and skin sensitivity  PMH:  Past Medical History:  Diagnosis Date  . Allergy    seasonal  fall  . Cancer ([place])    Basal cell carcinoma; multiple  . Eczema   . Hyperlipidemia   . Hypertension   . Osteoporosis   . Stroke [facility])     PSH:  Past Surgical History:  Procedure Laterality Date  . CATARACT EXTRACTION, BILATERAL  06/29/2015  . INTRAMEDULLARY (IM) NAIL INTERTROCHANTERIC Left 03/30/2017   Procedure: INTRAMEDULLARY (IM) NAIL INTERTROCHANTRIC;  Surgeon: [name], MD;  Location: [place];  Service: Orthopedics;  Laterality: Left;  . MOHS SURGERY    . TONSILLECTOMY  1940 maybe    Social History:  Social History   Socioeconomic History  . Marital status: Married    Spouse name: Not on file  . Number of children: Not on file  . Years of education: Not on file  . Highest education level: Not on file  Occupational History  . Occupation: retired  Scientific laboratory technician  . Financial resource strain: Not on file  . Food insecurity:    Worry: Not on file    Inability: Not on file  . Transportation needs:    Medical: Not on file  Non-medical: Not on file  Tobacco Use  . Smoking status: Never Smoker  . Smokeless tobacco: Never Used  Substance and Sexual Activity  . Alcohol use: No    Comment: occas  . Drug use: No  . Sexual activity: Not Currently  Lifestyle  . Physical activity:    Days per week: Not on file    Minutes per session: Not on file  . Stress: Not on file  Relationships  . Social connections:    Talks on phone: Not on file    Gets together: Not on file    Attends religious service: Not on file    Active member of club or organization: Not on file    Attends  meetings of clubs or organizations: Not on file    Relationship status: Not on file  . Intimate partner violence:    Fear of current or ex partner: Not on file    Emotionally abused: Not on file    Physically abused: Not on file    Forced sexual activity: Not on file  Other Topics Concern  . Not on file  Social History Narrative   Marital status: divorced; good friend with ex-wife; not dating      Children: 2 children; 4 grandchildren; no gg      Lives:  Alone in house; daughter six blocks away      Employment: college professor; retired in 1997; history; [place]:  Never      Alcohol: tequila loves but won't buy it.      Exercise:  As much as can; previous competitive biking.      ADLs: quit mowing grass in 2017; no assistant devices; cooks and cleans and pays bills.  No driving; HAS NEVER DRIVEN; has always ridden bike.  Daughter takes patient to grocery store and to appointments.  Daughter drives patient to grocery store.      Advanced Directives:  No living; DNR; do not resuscitate.  HCPOA: [name]    Family History:  Family History  Problem Relation Age of Onset  . Arthritis Mother   . Heart disease Mother 33  . Hypertension Father     Medications:   Current Outpatient Medications on File Prior to Visit  Medication Sig Dispense Refill  . alendronate (FOSAMAX) 70 MG tablet Take 1 tablet (70 mg total) by mouth every 7 (seven) days. Take with a full glass of water on an empty stomach. 4 tablet 11  . aspirin EC 81 MG EC tablet Take 1 tablet (81 mg total) by mouth daily. 30 tablet 0  . clobetasol ointment (TEMOVATE) 9.73 % Apply 1 application 2 (two) times daily topically. 60 g prn  . fluocinonide-emollient (LIDEX-E) 0.05 % cream Apply 1 application topically 2 (two) times daily. 30 g 5  . furosemide (LASIX) 20 MG tablet Take 1 tablet (20 mg total) daily as needed by mouth. 90 tablet 1  . metoprolol succinate (TOPROL-XL) 50 MG 24 hr tablet Take 1  tablet (50 mg total) by mouth daily. Take with or immediately following a meal. 90 tablet 1  . pravastatin (PRAVACHOL) 40 MG tablet Take 1 tablet (40 mg total) by mouth daily. 90 tablet 1  . predniSONE (DELTASONE) 20 MG tablet Take 1 tablet (20 mg total) by mouth 2 (two) times daily with a meal. For 5 days then one tablet daily x 5 days (Patient taking differently: Take 20 mg by mouth as needed. For 5 days then one  tablet daily x 5 days) 15 tablet 0  . Skin Protectants, Misc. (EUCERIN) cream Apply 1 application topically as needed for dry skin. 454 g 1   No current facility-administered medications on file prior to visit.     Allergies:   Allergies  Allergen Reactions  . Seasonal Ic [Cholestatin]      Physical Exam  Vitals:   02/24/18 1247  BP: 121/78  Pulse: 70  Weight: 145 lb (65.8 kg)  Height: 5\' 7"  (1.702 m)   Body mass index is 22.71 kg/m. No exam data present  General: well developed, pleasant elderly caucasian male, well nourished, seated, in no evident distress Head: head normocephalic and atraumatic.   Neck: supple with no carotid or supraclavicular bruits Cardiovascular: irregular rate and rhythm, no murmurs Musculoskeletal: no deformity Skin:  no rash/petichiae Vascular:  Normal pulses all extremities  Neurologic Exam Mental Status: Awake and fully alert. Oriented to place and time. Recent and remote memory intact. Attention span, concentration and fund of knowledge appropriate. Mood and affect appropriate.  Cranial Nerves: Fundoscopic exam reveals sharp disc margins. Pupils equal, briskly reactive to light. Extraocular movements full without nystagmus. Visual fields full to confrontation. Hearing intact. Facial sensation intact. Face, tongue, palate moves normally and symmetrically.  Motor: Normal bulk and tone. Normal strength in all tested extremity muscles. Sensory.: intact to touch , pinprick , position and vibratory sensation.  Coordination: Rapid alternating  movements normal in all extremities. Finger-to-nose and heel-to-shin performed accurately bilaterally. Gait and Station: Arises from chair without difficulty. Stance is normal. Gait demonstrates normal stride length and balance . Able to heel, toe and tandem walk without difficulty.  Reflexes: 1+ and symmetric. Toes downgoing.    NIHSS  0 Modified Rankin  2    Diagnostic Data (Labs, Imaging, Testing)  Ct Head Wo Contrast 01/16/2018 IMPRESSION:  1. Findings are most consistent with a subacute stroke in the right caudate head and adjacent lentiform nucleus. Consider MRI for further evaluation/aging.  2. No evidence of cortical stroke or intracranial hemorrhage.   Mr [name] Head Wo Contrast 01/17/2018 IMPRESSION:   MRI HEAD  IMPRESSION  1. 2.7 x 2.7 x 3.0 cm acute ischemic right basal ganglia infarct. Associated mild petechial hemorrhage without frank hemorrhagic transformation.  2. No other acute intracranial abnormality.  3. Age-related cerebral atrophy with mild chronic small vessel ischemic disease with scattered remote lacunar infarcts involving the deep gray nuclei, periventricular white matter, and bilateral cerebellar hemispheres.   MRA HEAD  IMPRESSION  1. Negative intracranial MRA for large vessel occlusion.  2. Moderate intracranial atherosclerotic change, most pronounced within the bilateral PCAs with there are moderate to severe bilateral P2 stenoses, right greater than left.  3. Distal small vessel atheromatous irregularity.    Transthoracic Echocardiogram 01/18/18 Study Conclusions - Left ventricle: The cavity size was normal. Systolic function was   normal. The estimated ejection fraction was in the range of 55%   to 60%. Wall motion was normal; there were no regional wall   motion abnormalities. Left ventricular diastolic function   parameters were normal. - Aortic valve: There was mild stenosis. Valve area (VTI): 1.07   cm^2. Valve area (Vmax): 1.12 cm^2.  Valve area (Vmean): 1.03   cm^2. - Mitral valve: Calcified annulus. Moderately thickened, moderately   calcified leaflets . There was mild regurgitation. - Left atrium: The atrium was mildly dilated. - Atrial septum: No defect or patent foramen ovale was identified. - Pulmonary arteries: PA peak pressure: 46 mm Hg (S).  Bilateral Carotid Dopplers  01/17/2018 1-39% ICA stenosis.  Right vertebral artery flow exhibits an abnormal waveform.  Left vertebral artery flow is antegrade.    ASSESSMENT: [name] is a 84 y.o. year old male here with right BG/caudate infarct on 01/16/18 secondary to SVD. Vascular risk factors include HTN and HLD.    PLAN: -Continue aspirin 81 mg daily  and pravachol  for secondary stroke prevention -F/u with PCP regarding your HLD and HTN management -continue to monitor BP at home -continue home PT/OT  -referral placed for cardiology for assessment of frequent PVC's -Maintain strict control of hypertension with blood pressure goal below 130/90, diabetes with hemoglobin A1c goal below 6.5% and cholesterol with LDL cholesterol (bad cholesterol) goal below 70 mg/dL. I also advised the patient to eat a healthy diet with plenty of whole grains, cereals, fruits and vegetables, exercise regularly and maintain ideal body weight.  Follow up in 4 months or call earlier if needed   Greater than 50% time during this 25 minute consultation visit was spent on counseling and coordination of care about HLD, and HTN, discussion about risk benefit of anticoagulation and answering questions.    Venancio Poisson, AGNP-BC  [facility] [address] [place], [place] [address]  Phone [phone] Fax [phone]

## 2018-02-24 NOTE — Patient Instructions (Signed)
Continue aspirin 81 mg daily  and pravachol  for secondary stroke prevention  Continue to follow up with PCP regarding cholesterol and blood pressure management  Continue to monitor blood pressure at home  Continue to be active and eat a healthy diet  Maintain strict control of hypertension with blood pressure goal below 130/90, diabetes with hemoglobin A1c goal below 6.5% and cholesterol with LDL cholesterol (bad cholesterol) goal below 70 mg/dL. I also advised the patient to eat a healthy diet with plenty of whole grains, cereals, fruits and vegetables, exercise regularly and maintain ideal body weight.  Followup in the future with me in 4 months or call earlier if needed

## 2018-02-25 ENCOUNTER — Telehealth: Payer: Self-pay | Admitting: Family Medicine

## 2018-02-25 ENCOUNTER — Ambulatory Visit: Payer: Medicare Other | Admitting: Podiatry

## 2018-02-25 DIAGNOSIS — E785 Hyperlipidemia, unspecified: Secondary | ICD-10-CM | POA: Diagnosis not present

## 2018-02-25 DIAGNOSIS — R2689 Other abnormalities of gait and mobility: Secondary | ICD-10-CM | POA: Diagnosis not present

## 2018-02-25 DIAGNOSIS — I10 Essential (primary) hypertension: Secondary | ICD-10-CM | POA: Diagnosis not present

## 2018-02-25 DIAGNOSIS — I69398 Other sequelae of cerebral infarction: Secondary | ICD-10-CM | POA: Diagnosis not present

## 2018-02-25 DIAGNOSIS — M6281 Muscle weakness (generalized): Secondary | ICD-10-CM | POA: Diagnosis not present

## 2018-02-25 DIAGNOSIS — Z7902 Long term (current) use of antithrombotics/antiplatelets: Secondary | ICD-10-CM | POA: Diagnosis not present

## 2018-02-25 NOTE — Telephone Encounter (Signed)
Copied from [place]. Topic: Quick Communication - See Telephone Encounter >> Feb 25, 2018 10:17 AM Ivar Drape wrote: CRM for notification. See Telephone encounter for: 02/25/18. Buel Ream, Physical Therapist Assistant w/[facility] [phone] wanted to report that the patient fell today.  He has a minor scrape on his left elbow.  Patient expressed he's not in pain and there's no reason to go to the ER.

## 2018-02-27 DIAGNOSIS — Z7902 Long term (current) use of antithrombotics/antiplatelets: Secondary | ICD-10-CM | POA: Diagnosis not present

## 2018-02-27 DIAGNOSIS — I10 Essential (primary) hypertension: Secondary | ICD-10-CM | POA: Diagnosis not present

## 2018-02-27 DIAGNOSIS — E785 Hyperlipidemia, unspecified: Secondary | ICD-10-CM | POA: Diagnosis not present

## 2018-02-27 DIAGNOSIS — I69398 Other sequelae of cerebral infarction: Secondary | ICD-10-CM | POA: Diagnosis not present

## 2018-02-27 DIAGNOSIS — M6281 Muscle weakness (generalized): Secondary | ICD-10-CM | POA: Diagnosis not present

## 2018-02-27 DIAGNOSIS — R2689 Other abnormalities of gait and mobility: Secondary | ICD-10-CM | POA: Diagnosis not present

## 2018-02-27 NOTE — Progress Notes (Signed)
I reviewed above note and agree with the assessment and plan.   Rosalin Hawking, MD PhD Stroke Neurology 02/27/2018 4:29 PM

## 2018-03-02 DIAGNOSIS — I69398 Other sequelae of cerebral infarction: Secondary | ICD-10-CM | POA: Diagnosis not present

## 2018-03-02 DIAGNOSIS — E785 Hyperlipidemia, unspecified: Secondary | ICD-10-CM | POA: Diagnosis not present

## 2018-03-02 DIAGNOSIS — M6281 Muscle weakness (generalized): Secondary | ICD-10-CM | POA: Diagnosis not present

## 2018-03-02 DIAGNOSIS — R2689 Other abnormalities of gait and mobility: Secondary | ICD-10-CM | POA: Diagnosis not present

## 2018-03-02 DIAGNOSIS — I10 Essential (primary) hypertension: Secondary | ICD-10-CM | POA: Diagnosis not present

## 2018-03-02 DIAGNOSIS — Z7902 Long term (current) use of antithrombotics/antiplatelets: Secondary | ICD-10-CM | POA: Diagnosis not present

## 2018-03-03 DIAGNOSIS — R2689 Other abnormalities of gait and mobility: Secondary | ICD-10-CM | POA: Diagnosis not present

## 2018-03-03 DIAGNOSIS — Z7902 Long term (current) use of antithrombotics/antiplatelets: Secondary | ICD-10-CM | POA: Diagnosis not present

## 2018-03-03 DIAGNOSIS — I10 Essential (primary) hypertension: Secondary | ICD-10-CM | POA: Diagnosis not present

## 2018-03-03 DIAGNOSIS — I69398 Other sequelae of cerebral infarction: Secondary | ICD-10-CM | POA: Diagnosis not present

## 2018-03-03 DIAGNOSIS — E785 Hyperlipidemia, unspecified: Secondary | ICD-10-CM | POA: Diagnosis not present

## 2018-03-03 DIAGNOSIS — M6281 Muscle weakness (generalized): Secondary | ICD-10-CM | POA: Diagnosis not present

## 2018-03-05 ENCOUNTER — Telehealth: Payer: Self-pay | Admitting: Family Medicine

## 2018-03-05 DIAGNOSIS — Z7902 Long term (current) use of antithrombotics/antiplatelets: Secondary | ICD-10-CM | POA: Diagnosis not present

## 2018-03-05 DIAGNOSIS — R2689 Other abnormalities of gait and mobility: Secondary | ICD-10-CM | POA: Diagnosis not present

## 2018-03-05 DIAGNOSIS — I69398 Other sequelae of cerebral infarction: Secondary | ICD-10-CM | POA: Diagnosis not present

## 2018-03-05 DIAGNOSIS — E785 Hyperlipidemia, unspecified: Secondary | ICD-10-CM | POA: Diagnosis not present

## 2018-03-05 DIAGNOSIS — I10 Essential (primary) hypertension: Secondary | ICD-10-CM | POA: Diagnosis not present

## 2018-03-05 DIAGNOSIS — M6281 Muscle weakness (generalized): Secondary | ICD-10-CM | POA: Diagnosis not present

## 2018-03-05 NOTE — Telephone Encounter (Signed)
Copied from [place] [id number]. Topic: Quick Communication - See Telephone Encounter >> Mar 05, 2018  9:36 AM Boyd Kerbs wrote: CRM for notification. See Telephone encounter for: 03/05/18.  Gwinda Passe from [facility] Verbal orders PT 2 x 4 and 1 x 1

## 2018-03-05 NOTE — Telephone Encounter (Signed)
Verbal orders given  

## 2018-03-08 NOTE — Telephone Encounter (Signed)
Information noted. No further orders at this time.

## 2018-03-10 DIAGNOSIS — I69398 Other sequelae of cerebral infarction: Secondary | ICD-10-CM | POA: Diagnosis not present

## 2018-03-10 DIAGNOSIS — I10 Essential (primary) hypertension: Secondary | ICD-10-CM | POA: Diagnosis not present

## 2018-03-10 DIAGNOSIS — Z7902 Long term (current) use of antithrombotics/antiplatelets: Secondary | ICD-10-CM | POA: Diagnosis not present

## 2018-03-10 DIAGNOSIS — R2689 Other abnormalities of gait and mobility: Secondary | ICD-10-CM | POA: Diagnosis not present

## 2018-03-10 DIAGNOSIS — M6281 Muscle weakness (generalized): Secondary | ICD-10-CM | POA: Diagnosis not present

## 2018-03-10 DIAGNOSIS — E785 Hyperlipidemia, unspecified: Secondary | ICD-10-CM | POA: Diagnosis not present

## 2018-03-11 ENCOUNTER — Ambulatory Visit (INDEPENDENT_AMBULATORY_CARE_PROVIDER_SITE_OTHER): Payer: Medicare Other | Admitting: Family Medicine

## 2018-03-11 ENCOUNTER — Other Ambulatory Visit: Payer: Self-pay

## 2018-03-11 ENCOUNTER — Encounter: Payer: Self-pay | Admitting: Family Medicine

## 2018-03-11 VITALS — BP 118/72 | HR 82 | Temp 98.0°F | Resp 16 | Wt 146.0 lb

## 2018-03-11 DIAGNOSIS — I6930 Unspecified sequelae of cerebral infarction: Secondary | ICD-10-CM | POA: Diagnosis not present

## 2018-03-11 DIAGNOSIS — M810 Age-related osteoporosis without current pathological fracture: Secondary | ICD-10-CM | POA: Diagnosis not present

## 2018-03-11 DIAGNOSIS — I493 Ventricular premature depolarization: Secondary | ICD-10-CM

## 2018-03-11 DIAGNOSIS — J4521 Mild intermittent asthma with (acute) exacerbation: Secondary | ICD-10-CM | POA: Diagnosis not present

## 2018-03-11 DIAGNOSIS — E785 Hyperlipidemia, unspecified: Secondary | ICD-10-CM | POA: Diagnosis not present

## 2018-03-11 DIAGNOSIS — L2084 Intrinsic (allergic) eczema: Secondary | ICD-10-CM

## 2018-03-11 DIAGNOSIS — H6122 Impacted cerumen, left ear: Secondary | ICD-10-CM | POA: Diagnosis not present

## 2018-03-11 DIAGNOSIS — I63311 Cerebral infarction due to thrombosis of right middle cerebral artery: Secondary | ICD-10-CM

## 2018-03-11 DIAGNOSIS — I10 Essential (primary) hypertension: Secondary | ICD-10-CM | POA: Diagnosis not present

## 2018-03-11 DIAGNOSIS — W19XXXA Unspecified fall, initial encounter: Secondary | ICD-10-CM | POA: Diagnosis not present

## 2018-03-11 DIAGNOSIS — R000 Tachycardia, unspecified: Secondary | ICD-10-CM | POA: Diagnosis not present

## 2018-03-11 NOTE — Progress Notes (Signed)
Subjective:    Patient ID: [name], male    DOB: 08-27-1932, 84 y.o.   MRN: [id number]  03/11/2018  Chronic Conditions (follow-up )    HPI This 84 y.o. male presents for one month follow-up of recent CVA due to thrombosis of R middle cerebral artery.  Management changes made last visit include the following: CHANGE FUROSEMIDE/LASIX 20MG  TO ONCE DAILY ONLY AS NEEDED FOR LEG SWELLING; DO NOT TAKE EVERY DAY. STOP PLAVIX. START ASPIRIN 81MG  ONE TABLET DAILY.  BABY ASPIRIN.  UPDATE:  [place] [address] one time two weeks ago; fell off of the bed and landed forward with a crash.  Physical therapy advised to get up slowly.  Wearing emergency call necklace more now.  Not always sleeping with it.   Three police came because had a vivid dream and alarmed the neighbors.   Wheezes with exertion.   Previous use of inhaler.   Physical therapy twice weekly for the upcoming month Physical therapy has noticed wheezing.   Still having urinary incontinence despite stopping Lasix.   BP Readings from Last 3 Encounters:  05/11/18 130/82  03/11/18 118/72  02/24/18 121/78   Wt Readings from Last 3 Encounters:  05/11/18 145 lb (65.8 kg)  03/11/18 146 lb (66.2 kg)  02/24/18 145 lb (65.8 kg)   Immunization History  Administered Date(s) Administered  . Influenza, Seasonal, Injecte, Preservative Fre 10/14/2012  . Influenza,inj,Quad PF,6+ Mos 12/07/2014, 08/11/2015, 09/11/2016, 08/25/2017  . Pneumococcal Conjugate-13 06/03/2014  . Pneumococcal Polysaccharide-23 04/14/2007, 09/22/2015  . Tdap 04/14/2007, 09/25/2016  . Zoster 12/06/2011    Review of Systems  Constitutional: Negative for activity change, appetite change, chills, diaphoresis, fatigue, fever and unexpected weight change.  HENT: Negative for congestion, dental problem, drooling, ear discharge, ear pain, facial swelling, hearing loss, mouth sores, nosebleeds, postnasal drip, rhinorrhea, sinus pressure, sneezing, sore throat, tinnitus, trouble  swallowing and voice change.   Eyes: Negative for photophobia, pain, discharge, redness, itching and visual disturbance.  Respiratory: Negative for apnea, cough, choking, chest tightness, shortness of breath, wheezing and stridor.   Cardiovascular: Positive for leg swelling. Negative for chest pain and palpitations.  Gastrointestinal: Negative for abdominal pain, blood in stool, constipation, diarrhea, nausea and vomiting.  Endocrine: Negative for cold intolerance, heat intolerance, polydipsia, polyphagia and polyuria.  Genitourinary: Negative for decreased urine volume, difficulty urinating, discharge, dysuria, enuresis, flank pain, frequency, genital sores, hematuria, penile pain, penile swelling, scrotal swelling, testicular pain and urgency.  Musculoskeletal: Positive for gait problem. Negative for arthralgias, back pain, joint swelling, myalgias, neck pain and neck stiffness.  Skin: Positive for rash. Negative for color change, pallor and wound.  Allergic/Immunologic: Negative for environmental allergies, food allergies and immunocompromised state.  Neurological: Negative for dizziness, tremors, seizures, syncope, facial asymmetry, speech difficulty, weakness, light-headedness, numbness and headaches.  Hematological: Negative for adenopathy. Does not bruise/bleed easily.  Psychiatric/Behavioral: Negative for agitation, behavioral problems, confusion, decreased concentration, dysphoric mood, hallucinations, self-injury, sleep disturbance and suicidal ideas. The patient is not nervous/anxious and is not hyperactive.     Past Medical History:  Diagnosis Date  . Allergy    seasonal  fall  . Cancer ([place])    Basal cell carcinoma; multiple  . Eczema   . Hyperlipidemia   . Hypertension   . Osteoporosis   . Stroke [facility])    Past Surgical History:  Procedure Laterality Date  . CATARACT EXTRACTION, BILATERAL  06/29/2015  . INTRAMEDULLARY (IM) NAIL INTERTROCHANTERIC Left 03/30/2017   Procedure:  INTRAMEDULLARY (IM) NAIL INTERTROCHANTRIC;  Surgeon: Marlou Sa,  Tonna Corner, MD;  Location: [facility];  Service: Orthopedics;  Laterality: Left;  . MOHS SURGERY    . TONSILLECTOMY  1940 maybe   Allergies  Allergen Reactions  . Seasonal Ic [Cholestatin]    Current Outpatient Medications on File Prior to Visit  Medication Sig Dispense Refill  . alendronate (FOSAMAX) 70 MG tablet Take 1 tablet (70 mg total) by mouth every 7 (seven) days. Take with a full glass of water on an empty stomach. 4 tablet 11  . aspirin EC 81 MG EC tablet Take 1 tablet (81 mg total) by mouth daily. 30 tablet 0  . clobetasol ointment (TEMOVATE) 9.14 % Apply 1 application 2 (two) times daily topically. 60 g prn  . fluocinonide-emollient (LIDEX-E) 0.05 % cream Apply 1 application topically 2 (two) times daily. 30 g 5  . furosemide (LASIX) 20 MG tablet Take 1 tablet (20 mg total) daily as needed by mouth. 90 tablet 1  . metoprolol succinate (TOPROL-XL) 50 MG 24 hr tablet Take 1 tablet (50 mg total) by mouth daily. Take with or immediately following a meal. 90 tablet 1  . pravastatin (PRAVACHOL) 40 MG tablet Take 1 tablet (40 mg total) by mouth daily. 90 tablet 1  . Skin Protectants, Misc. (EUCERIN) cream Apply 1 application topically as needed for dry skin. 454 g 1   No current facility-administered medications on file prior to visit.    Social History   Socioeconomic History  . Marital status: Married    Spouse name: Not on file  . Number of children: Not on file  . Years of education: Not on file  . Highest education level: Not on file  Occupational History  . Occupation: retired  Scientific laboratory technician  . Financial resource strain: Not on file  . Food insecurity:    Worry: Not on file    Inability: Not on file  . Transportation needs:    Medical: Not on file    Non-medical: Not on file  Tobacco Use  . Smoking status: Never Smoker  . Smokeless tobacco: Never Used  Substance and Sexual Activity  . Alcohol use: No     Comment: occas  . Drug use: No  . Sexual activity: Not Currently  Lifestyle  . Physical activity:    Days per week: Not on file    Minutes per session: Not on file  . Stress: Not on file  Relationships  . Social connections:    Talks on phone: Not on file    Gets together: Not on file    Attends religious service: Not on file    Active member of club or organization: Not on file    Attends meetings of clubs or organizations: Not on file    Relationship status: Not on file  . Intimate partner violence:    Fear of current or ex partner: Not on file    Emotionally abused: Not on file    Physically abused: Not on file    Forced sexual activity: Not on file  Other Topics Concern  . 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      Exercise:  As much as can; previous competitive biking.      ADLs: quit mowing grass in 2017; no assistant devices; cooks and cleans and pays bills.  No driving; HAS NEVER DRIVEN; has always ridden bike.  Daughter takes patient to grocery store and to appointments.  Daughter drives patient to grocery store.      Advanced Directives:  No living; DNR; do not resuscitate.  HCPOA: [name] Cummings   Family History  Problem Relation Age of Onset  . Arthritis Mother   . Heart disease Mother 66  . Hypertension Father        Objective:    BP 118/72   Pulse 82   Temp 98 F (36.7 C) (Oral)   Resp 16   Wt 146 lb (66.2 kg)   SpO2 96%   BMI 22.87 kg/m  Physical Exam  Constitutional: He is oriented to person, place, and time. He appears well-developed and well-nourished. No distress.  HENT:  Head: Normocephalic and atraumatic.  Right Ear: External ear  normal.  Left Ear: External ear normal.  Nose: Nose normal.  Mouth/Throat: Oropharynx is clear and moist.  Cerumen impaction LEFT ear.  Eyes: Pupils are equal, round, and reactive to light. Conjunctivae and EOM are normal.  Neck: Normal range of motion. Neck supple. Carotid bruit is not present. No thyromegaly present.  Cardiovascular: Normal rate, regular rhythm, normal heart sounds and intact distal pulses. Exam reveals no gallop and no friction rub.  No murmur heard. Trace edema B lower extremities.  Pulmonary/Chest: Effort normal and breath sounds normal. He has no wheezes. He has no rales.  Abdominal: Soft. Bowel sounds are normal. He exhibits no distension and no mass. There is no tenderness. There is no rebound and no guarding.  Musculoskeletal: He exhibits edema.  Lymphadenopathy:    He has no cervical adenopathy.  Neurological: He is alert and oriented to person, place, and time. No cranial nerve deficit or sensory deficit. He exhibits normal muscle tone.  Skin: Skin is warm and dry. No rash noted. He is not diaphoretic.  Diffusely scaling skin B forearms.  Psychiatric: He has a normal mood and affect. His behavior is normal.  Nursing note and vitals reviewed.  No results found. Depression screen [facility] 2/9 05/11/2018 03/11/2018 02/14/2018 01/16/2018 09/10/2017  Decreased Interest 0 0 0 0 0  Down, Depressed, Hopeless 0 0 0 0 0  PHQ - 2 Score 0 0 0 0 0   Fall Risk  05/11/2018 03/11/2018 02/24/2018 02/14/2018 01/16/2018  Falls in the past year? Yes Yes Yes Yes No  Comment - - - - -  Number falls in past yr: 1 2 or more 2 or more 1 -  Injury with Fall? No - No No -  Comment - - - - -  Risk Factor Category  High Fall Risk - - - -  Risk for fall due to : - - - - -  Follow up - - - - -    PROCEDURE: LEFT EAR IRRIGATION.    Assessment & Plan:   1. Benign essential HTN   2. Cerebrovascular accident (CVA) due to thrombosis of right middle cerebral artery ([place])   3. Dyslipidemia   4.  Tachycardia   5. PVC (premature ventricular contraction)   6. Impacted cerumen of left ear   7. Fall, initial encounter   8. Mild intermittent asthma with acute exacerbation   9. Age-related osteoporosis without current pathological fracture  10. Intrinsic eczema     Stable chronic medical conditions other than recurrent falls due to advanced age and recent CVA.  Continue  Physical therapy and occupational therapy; continue to ambulate with rolling walker.  Needs to avoid falls due to osteoporosis and history of hip fracture.  Good family support by daughter. Obtain labs for chronic disease management.   L ear cerumen impaction: New; s/p irrigation in office.  Orders Placed This Encounter  Procedures  . CBC with Differential/Platelet  . Comprehensive metabolic panel    Order Specific Question:   Has the patient fasted?    Answer:   No  . Lipid panel    Order Specific Question:   Has the patient fasted?    Answer:   No  . Ear wax removal    LEFT   No orders of the defined types were placed in this encounter.   Return in about 2 months (around 05/11/2018) for recheck [facility].   Jann Milkovich Elayne Guerin, M.D. Primary Care at [facility] previously [facility] [address] [place], [place]  [address] [phone] phone [phone] fax

## 2018-03-11 NOTE — Patient Instructions (Signed)
     IF you received an x-ray today, you will receive an invoice from [facility]. Please contact  Radiology at [phone] with questions or concerns regarding your invoice.   IF you received labwork today, you will receive an invoice from [facility]. Please contact [facility] at [phone] with questions or concerns regarding your invoice.   Our billing staff will not be able to assist you with questions regarding bills from these companies.  You will be contacted with the lab results as soon as they are available. The fastest way to get your results is to activate your My Chart account. Instructions are located on the last page of this paperwork. If you have not heard from us regarding the results in 2 weeks, please contact this office.     

## 2018-03-12 ENCOUNTER — Telehealth: Payer: Self-pay | Admitting: Family Medicine

## 2018-03-12 DIAGNOSIS — E785 Hyperlipidemia, unspecified: Secondary | ICD-10-CM | POA: Diagnosis not present

## 2018-03-12 DIAGNOSIS — M6281 Muscle weakness (generalized): Secondary | ICD-10-CM | POA: Diagnosis not present

## 2018-03-12 DIAGNOSIS — I10 Essential (primary) hypertension: Secondary | ICD-10-CM | POA: Diagnosis not present

## 2018-03-12 DIAGNOSIS — Z7902 Long term (current) use of antithrombotics/antiplatelets: Secondary | ICD-10-CM | POA: Diagnosis not present

## 2018-03-12 DIAGNOSIS — I69398 Other sequelae of cerebral infarction: Secondary | ICD-10-CM | POA: Diagnosis not present

## 2018-03-12 DIAGNOSIS — R2689 Other abnormalities of gait and mobility: Secondary | ICD-10-CM | POA: Diagnosis not present

## 2018-03-12 LAB — COMPREHENSIVE METABOLIC PANEL
ALT: 11 IU/L (ref 0–44)
AST: 20 IU/L (ref 0–40)
Albumin/Globulin Ratio: 1.4 (ref 1.2–2.2)
Albumin: 3.9 g/dL (ref 3.5–4.7)
Alkaline Phosphatase: 93 IU/L (ref 39–117)
BUN/Creatinine Ratio: 14 (ref 10–24)
BUN: 18 mg/dL (ref 8–27)
Bilirubin Total: 0.4 mg/dL (ref 0.0–1.2)
CALCIUM: 9.5 mg/dL (ref 8.6–10.2)
CO2: 25 mmol/L (ref 20–29)
CREATININE: 1.27 mg/dL (ref 0.76–1.27)
Chloride: 100 mmol/L (ref 96–106)
GFR calc Af Amer: 60 mL/min/{1.73_m2} (ref >59)
GFR calc non Af Amer: 52 mL/min/{1.73_m2} — ABNORMAL LOW (ref >59)
Globulin, Total: 2.7 g/dL (ref 1.5–4.5)
Glucose: 89 mg/dL (ref 65–99)
Potassium: 4.7 mmol/L (ref 3.5–5.2)
SODIUM: 142 mmol/L (ref 134–144)
Total Protein: 6.6 g/dL (ref 6.0–8.5)

## 2018-03-12 LAB — CBC WITH DIFFERENTIAL/PLATELET
Basophils Absolute: 0 10*3/uL (ref 0.0–0.2)
Basos: 1 %
EOS (ABSOLUTE): 1.1 10*3/uL — AB (ref 0.0–0.4)
EOS: 14 %
Hematocrit: 43.6 % (ref 37.5–51.0)
Hemoglobin: 14.3 g/dL (ref 13.0–17.7)
IMMATURE GRANULOCYTES: 0 %
Immature Grans (Abs): 0 10*3/uL (ref 0.0–0.1)
Lymphocytes Absolute: 1.5 10*3/uL (ref 0.7–3.1)
Lymphs: 19 %
MCH: 31.5 pg (ref 26.6–33.0)
MCHC: 32.8 g/dL (ref 31.5–35.7)
MCV: 96 fL (ref 79–97)
MONOS ABS: 0.7 10*3/uL (ref 0.1–0.9)
Monocytes: 9 %
NEUTROS PCT: 57 %
Neutrophils Absolute: 4.6 10*3/uL (ref 1.4–7.0)
PLATELETS: 198 10*3/uL (ref 150–379)
RBC: 4.54 x10E6/uL (ref 4.14–5.80)
RDW: 12.7 % (ref 12.3–15.4)
WBC: 8.1 10*3/uL (ref 3.4–10.8)

## 2018-03-12 LAB — LIPID PANEL
CHOLESTEROL TOTAL: 161 mg/dL (ref 100–199)
Chol/HDL Ratio: 2.5 ratio (ref 0.0–5.0)
HDL: 64 mg/dL (ref >39)
LDL CALC: 79 mg/dL (ref 0–99)
TRIGLYCERIDES: 92 mg/dL (ref 0–149)
VLDL Cholesterol Cal: 18 mg/dL (ref 5–40)

## 2018-03-12 NOTE — Telephone Encounter (Signed)
Returned call to [facility] verbal order for nursing eval for disease mgmt.

## 2018-03-12 NOTE — Telephone Encounter (Signed)
Copied from [place] [id number]. Topic: Quick Communication - See Telephone Encounter >> Mar 12, 2018 10:07 AM Rutherford Nail, NT wrote: CRM for notification. See Telephone encounter for: 03/12/18. Gwinda Passe, Physical therapist with [facility], calling and is needing verbal orders for a nursing evaluation for disease management. Please advise. CB#: [phone]

## 2018-03-16 ENCOUNTER — Telehealth: Payer: Self-pay | Admitting: Family Medicine

## 2018-03-16 DIAGNOSIS — Z7902 Long term (current) use of antithrombotics/antiplatelets: Secondary | ICD-10-CM | POA: Diagnosis not present

## 2018-03-16 DIAGNOSIS — I69398 Other sequelae of cerebral infarction: Secondary | ICD-10-CM | POA: Diagnosis not present

## 2018-03-16 DIAGNOSIS — E785 Hyperlipidemia, unspecified: Secondary | ICD-10-CM | POA: Diagnosis not present

## 2018-03-16 DIAGNOSIS — R2689 Other abnormalities of gait and mobility: Secondary | ICD-10-CM | POA: Diagnosis not present

## 2018-03-16 DIAGNOSIS — M6281 Muscle weakness (generalized): Secondary | ICD-10-CM | POA: Diagnosis not present

## 2018-03-16 DIAGNOSIS — I10 Essential (primary) hypertension: Secondary | ICD-10-CM | POA: Diagnosis not present

## 2018-03-16 NOTE — Telephone Encounter (Signed)
Provider, patient fell twice within 3 days. Do you want to see him for an appointment? Patient just had office visit on 03/11/18 with you. Please advise.

## 2018-03-16 NOTE — Telephone Encounter (Signed)
Copied from [place] [id number]. Topic: Quick Communication - See Telephone Encounter >> Mar 16, 2018 11:06 AM Boyd Kerbs wrote: CRM for notification. See Telephone encounter for: 03/16/18.  Gwinda Passe [facility] [phone]  pt had a fall today and another fall on Saturday when in [place] with family.  He states no pain or injuries.

## 2018-03-17 ENCOUNTER — Encounter: Payer: Self-pay | Admitting: Family Medicine

## 2018-03-17 NOTE — Telephone Encounter (Signed)
Please call patient and/or daughter to check status.  Please get detail of falls. I do recommend follow-up visit in the upcoming two weeks.

## 2018-03-18 NOTE — Telephone Encounter (Signed)
Tried to reach patient  No voicemail set up

## 2018-03-19 DIAGNOSIS — I69398 Other sequelae of cerebral infarction: Secondary | ICD-10-CM | POA: Diagnosis not present

## 2018-03-19 DIAGNOSIS — I10 Essential (primary) hypertension: Secondary | ICD-10-CM | POA: Diagnosis not present

## 2018-03-19 DIAGNOSIS — M6281 Muscle weakness (generalized): Secondary | ICD-10-CM | POA: Diagnosis not present

## 2018-03-19 DIAGNOSIS — E785 Hyperlipidemia, unspecified: Secondary | ICD-10-CM | POA: Diagnosis not present

## 2018-03-19 DIAGNOSIS — R2689 Other abnormalities of gait and mobility: Secondary | ICD-10-CM | POA: Diagnosis not present

## 2018-03-19 DIAGNOSIS — Z7902 Long term (current) use of antithrombotics/antiplatelets: Secondary | ICD-10-CM | POA: Diagnosis not present

## 2018-03-19 NOTE — Telephone Encounter (Signed)
Called pt daughter. First fall was while pt was shaving. She stated that patient lost his balance from leaning too far back. No injuries to this fall. The second fall was from the bed to floor.  His mattress pad slipped and it was more of an assisted fall to the ground with no injuries. [facility] evaluated pt for these with no evidence of injury and vitals stable. Pt daughter stated that she did not believe he needed to come in. Had pt schedule for a couple weeks while we await your response on need for eval.

## 2018-03-19 NOTE — Telephone Encounter (Signed)
No further action warranted; no office visit needed at this time. Can wait until 04/01/18 as scheduled.

## 2018-03-24 DIAGNOSIS — M6281 Muscle weakness (generalized): Secondary | ICD-10-CM | POA: Diagnosis not present

## 2018-03-24 DIAGNOSIS — R2689 Other abnormalities of gait and mobility: Secondary | ICD-10-CM | POA: Diagnosis not present

## 2018-03-24 DIAGNOSIS — Z7902 Long term (current) use of antithrombotics/antiplatelets: Secondary | ICD-10-CM | POA: Diagnosis not present

## 2018-03-24 DIAGNOSIS — I69398 Other sequelae of cerebral infarction: Secondary | ICD-10-CM | POA: Diagnosis not present

## 2018-03-24 DIAGNOSIS — E785 Hyperlipidemia, unspecified: Secondary | ICD-10-CM | POA: Diagnosis not present

## 2018-03-24 DIAGNOSIS — I10 Essential (primary) hypertension: Secondary | ICD-10-CM | POA: Diagnosis not present

## 2018-03-25 ENCOUNTER — Encounter: Payer: Self-pay | Admitting: Family Medicine

## 2018-03-25 DIAGNOSIS — I10 Essential (primary) hypertension: Secondary | ICD-10-CM | POA: Diagnosis not present

## 2018-03-25 DIAGNOSIS — I69398 Other sequelae of cerebral infarction: Secondary | ICD-10-CM | POA: Diagnosis not present

## 2018-03-25 DIAGNOSIS — M6281 Muscle weakness (generalized): Secondary | ICD-10-CM | POA: Diagnosis not present

## 2018-03-25 DIAGNOSIS — E785 Hyperlipidemia, unspecified: Secondary | ICD-10-CM | POA: Diagnosis not present

## 2018-03-25 DIAGNOSIS — Z7902 Long term (current) use of antithrombotics/antiplatelets: Secondary | ICD-10-CM | POA: Diagnosis not present

## 2018-03-25 DIAGNOSIS — R2689 Other abnormalities of gait and mobility: Secondary | ICD-10-CM | POA: Diagnosis not present

## 2018-03-26 DIAGNOSIS — M6281 Muscle weakness (generalized): Secondary | ICD-10-CM | POA: Diagnosis not present

## 2018-03-26 DIAGNOSIS — E785 Hyperlipidemia, unspecified: Secondary | ICD-10-CM | POA: Diagnosis not present

## 2018-03-26 DIAGNOSIS — R2689 Other abnormalities of gait and mobility: Secondary | ICD-10-CM | POA: Diagnosis not present

## 2018-03-26 DIAGNOSIS — Z7902 Long term (current) use of antithrombotics/antiplatelets: Secondary | ICD-10-CM | POA: Diagnosis not present

## 2018-03-26 DIAGNOSIS — I10 Essential (primary) hypertension: Secondary | ICD-10-CM | POA: Diagnosis not present

## 2018-03-26 DIAGNOSIS — I69398 Other sequelae of cerebral infarction: Secondary | ICD-10-CM | POA: Diagnosis not present

## 2018-03-30 DIAGNOSIS — Z7902 Long term (current) use of antithrombotics/antiplatelets: Secondary | ICD-10-CM | POA: Diagnosis not present

## 2018-03-30 DIAGNOSIS — M6281 Muscle weakness (generalized): Secondary | ICD-10-CM | POA: Diagnosis not present

## 2018-03-30 DIAGNOSIS — I10 Essential (primary) hypertension: Secondary | ICD-10-CM | POA: Diagnosis not present

## 2018-03-30 DIAGNOSIS — E785 Hyperlipidemia, unspecified: Secondary | ICD-10-CM | POA: Diagnosis not present

## 2018-03-30 DIAGNOSIS — R2689 Other abnormalities of gait and mobility: Secondary | ICD-10-CM | POA: Diagnosis not present

## 2018-03-30 DIAGNOSIS — I69398 Other sequelae of cerebral infarction: Secondary | ICD-10-CM | POA: Diagnosis not present

## 2018-04-01 ENCOUNTER — Ambulatory Visit: Payer: Medicare Other | Admitting: Family Medicine

## 2018-04-01 DIAGNOSIS — I10 Essential (primary) hypertension: Secondary | ICD-10-CM | POA: Diagnosis not present

## 2018-04-01 DIAGNOSIS — E785 Hyperlipidemia, unspecified: Secondary | ICD-10-CM | POA: Diagnosis not present

## 2018-04-01 DIAGNOSIS — Z7902 Long term (current) use of antithrombotics/antiplatelets: Secondary | ICD-10-CM | POA: Diagnosis not present

## 2018-04-01 DIAGNOSIS — I69398 Other sequelae of cerebral infarction: Secondary | ICD-10-CM | POA: Diagnosis not present

## 2018-04-01 DIAGNOSIS — M6281 Muscle weakness (generalized): Secondary | ICD-10-CM | POA: Diagnosis not present

## 2018-04-01 DIAGNOSIS — R2689 Other abnormalities of gait and mobility: Secondary | ICD-10-CM | POA: Diagnosis not present

## 2018-04-06 ENCOUNTER — Telehealth: Payer: Self-pay | Admitting: Family Medicine

## 2018-04-06 DIAGNOSIS — E785 Hyperlipidemia, unspecified: Secondary | ICD-10-CM | POA: Diagnosis not present

## 2018-04-06 DIAGNOSIS — Z7902 Long term (current) use of antithrombotics/antiplatelets: Secondary | ICD-10-CM | POA: Diagnosis not present

## 2018-04-06 DIAGNOSIS — R2689 Other abnormalities of gait and mobility: Secondary | ICD-10-CM | POA: Diagnosis not present

## 2018-04-06 DIAGNOSIS — I69398 Other sequelae of cerebral infarction: Secondary | ICD-10-CM | POA: Diagnosis not present

## 2018-04-06 DIAGNOSIS — I10 Essential (primary) hypertension: Secondary | ICD-10-CM | POA: Diagnosis not present

## 2018-04-06 DIAGNOSIS — M6281 Muscle weakness (generalized): Secondary | ICD-10-CM | POA: Diagnosis not present

## 2018-04-06 NOTE — Telephone Encounter (Signed)
Copied from [place] [id number]. Topic: Inquiry >> Apr 06, 2018 12:10 PM Lennox Solders wrote: Reason for CRM: betsy PT  from [facility] is calling needing verbal orders for phyiscal therapy 2  x 3 wk and then once a wk for 1 wk

## 2018-04-07 NOTE — Telephone Encounter (Signed)
Incoming call from [place], verbal orders for PT given.

## 2018-04-07 NOTE — Telephone Encounter (Signed)
Phone call to [facility]. Unable to reach. Left generic message stating Dr. Thompson Caul office is calling in regards to order for a patient, please call back.   If [place] calls back, please transfer call to Primary Care at [facility] staff so verbal can be given.

## 2018-04-08 DIAGNOSIS — I69398 Other sequelae of cerebral infarction: Secondary | ICD-10-CM | POA: Diagnosis not present

## 2018-04-08 DIAGNOSIS — M6281 Muscle weakness (generalized): Secondary | ICD-10-CM | POA: Diagnosis not present

## 2018-04-08 DIAGNOSIS — Z7902 Long term (current) use of antithrombotics/antiplatelets: Secondary | ICD-10-CM | POA: Diagnosis not present

## 2018-04-08 DIAGNOSIS — R2689 Other abnormalities of gait and mobility: Secondary | ICD-10-CM | POA: Diagnosis not present

## 2018-04-08 DIAGNOSIS — E785 Hyperlipidemia, unspecified: Secondary | ICD-10-CM | POA: Diagnosis not present

## 2018-04-08 DIAGNOSIS — I10 Essential (primary) hypertension: Secondary | ICD-10-CM | POA: Diagnosis not present

## 2018-04-13 DIAGNOSIS — R2689 Other abnormalities of gait and mobility: Secondary | ICD-10-CM | POA: Diagnosis not present

## 2018-04-13 DIAGNOSIS — E785 Hyperlipidemia, unspecified: Secondary | ICD-10-CM | POA: Diagnosis not present

## 2018-04-13 DIAGNOSIS — I69398 Other sequelae of cerebral infarction: Secondary | ICD-10-CM | POA: Diagnosis not present

## 2018-04-13 DIAGNOSIS — Z7902 Long term (current) use of antithrombotics/antiplatelets: Secondary | ICD-10-CM | POA: Diagnosis not present

## 2018-04-13 DIAGNOSIS — M6281 Muscle weakness (generalized): Secondary | ICD-10-CM | POA: Diagnosis not present

## 2018-04-13 DIAGNOSIS — I10 Essential (primary) hypertension: Secondary | ICD-10-CM | POA: Diagnosis not present

## 2018-04-15 DIAGNOSIS — Z7902 Long term (current) use of antithrombotics/antiplatelets: Secondary | ICD-10-CM | POA: Diagnosis not present

## 2018-04-15 DIAGNOSIS — M6281 Muscle weakness (generalized): Secondary | ICD-10-CM | POA: Diagnosis not present

## 2018-04-15 DIAGNOSIS — I69398 Other sequelae of cerebral infarction: Secondary | ICD-10-CM | POA: Diagnosis not present

## 2018-04-15 DIAGNOSIS — R2689 Other abnormalities of gait and mobility: Secondary | ICD-10-CM | POA: Diagnosis not present

## 2018-04-15 DIAGNOSIS — I10 Essential (primary) hypertension: Secondary | ICD-10-CM | POA: Diagnosis not present

## 2018-04-15 DIAGNOSIS — E785 Hyperlipidemia, unspecified: Secondary | ICD-10-CM | POA: Diagnosis not present

## 2018-04-21 DIAGNOSIS — R2689 Other abnormalities of gait and mobility: Secondary | ICD-10-CM | POA: Diagnosis not present

## 2018-04-21 DIAGNOSIS — I69398 Other sequelae of cerebral infarction: Secondary | ICD-10-CM | POA: Diagnosis not present

## 2018-04-21 DIAGNOSIS — Z7902 Long term (current) use of antithrombotics/antiplatelets: Secondary | ICD-10-CM | POA: Diagnosis not present

## 2018-04-21 DIAGNOSIS — E785 Hyperlipidemia, unspecified: Secondary | ICD-10-CM | POA: Diagnosis not present

## 2018-04-21 DIAGNOSIS — M6281 Muscle weakness (generalized): Secondary | ICD-10-CM | POA: Diagnosis not present

## 2018-04-21 DIAGNOSIS — I10 Essential (primary) hypertension: Secondary | ICD-10-CM | POA: Diagnosis not present

## 2018-04-23 DIAGNOSIS — E785 Hyperlipidemia, unspecified: Secondary | ICD-10-CM | POA: Diagnosis not present

## 2018-04-23 DIAGNOSIS — Z7902 Long term (current) use of antithrombotics/antiplatelets: Secondary | ICD-10-CM | POA: Diagnosis not present

## 2018-04-23 DIAGNOSIS — I69398 Other sequelae of cerebral infarction: Secondary | ICD-10-CM | POA: Diagnosis not present

## 2018-04-23 DIAGNOSIS — R2689 Other abnormalities of gait and mobility: Secondary | ICD-10-CM | POA: Diagnosis not present

## 2018-04-23 DIAGNOSIS — I10 Essential (primary) hypertension: Secondary | ICD-10-CM | POA: Diagnosis not present

## 2018-04-23 DIAGNOSIS — M6281 Muscle weakness (generalized): Secondary | ICD-10-CM | POA: Diagnosis not present

## 2018-05-01 DIAGNOSIS — Z7902 Long term (current) use of antithrombotics/antiplatelets: Secondary | ICD-10-CM | POA: Diagnosis not present

## 2018-05-01 DIAGNOSIS — R2689 Other abnormalities of gait and mobility: Secondary | ICD-10-CM | POA: Diagnosis not present

## 2018-05-01 DIAGNOSIS — I69398 Other sequelae of cerebral infarction: Secondary | ICD-10-CM | POA: Diagnosis not present

## 2018-05-01 DIAGNOSIS — I10 Essential (primary) hypertension: Secondary | ICD-10-CM | POA: Diagnosis not present

## 2018-05-01 DIAGNOSIS — M6281 Muscle weakness (generalized): Secondary | ICD-10-CM | POA: Diagnosis not present

## 2018-05-01 DIAGNOSIS — E785 Hyperlipidemia, unspecified: Secondary | ICD-10-CM | POA: Diagnosis not present

## 2018-05-06 DIAGNOSIS — R2689 Other abnormalities of gait and mobility: Secondary | ICD-10-CM | POA: Diagnosis not present

## 2018-05-06 DIAGNOSIS — I10 Essential (primary) hypertension: Secondary | ICD-10-CM | POA: Diagnosis not present

## 2018-05-06 DIAGNOSIS — Z7902 Long term (current) use of antithrombotics/antiplatelets: Secondary | ICD-10-CM | POA: Diagnosis not present

## 2018-05-06 DIAGNOSIS — E785 Hyperlipidemia, unspecified: Secondary | ICD-10-CM | POA: Diagnosis not present

## 2018-05-06 DIAGNOSIS — M6281 Muscle weakness (generalized): Secondary | ICD-10-CM | POA: Diagnosis not present

## 2018-05-06 DIAGNOSIS — I69398 Other sequelae of cerebral infarction: Secondary | ICD-10-CM | POA: Diagnosis not present

## 2018-05-11 ENCOUNTER — Encounter: Payer: Self-pay | Admitting: Family Medicine

## 2018-05-11 ENCOUNTER — Other Ambulatory Visit: Payer: Self-pay

## 2018-05-11 ENCOUNTER — Ambulatory Visit (INDEPENDENT_AMBULATORY_CARE_PROVIDER_SITE_OTHER): Payer: Medicare Other | Admitting: Family Medicine

## 2018-05-11 VITALS — BP 130/82 | HR 77 | Temp 98.0°F | Resp 16 | Ht 67.0 in | Wt 145.0 lb

## 2018-05-11 DIAGNOSIS — I493 Ventricular premature depolarization: Secondary | ICD-10-CM

## 2018-05-11 DIAGNOSIS — L2084 Intrinsic (allergic) eczema: Secondary | ICD-10-CM

## 2018-05-11 DIAGNOSIS — R600 Localized edema: Secondary | ICD-10-CM | POA: Diagnosis not present

## 2018-05-11 DIAGNOSIS — R296 Repeated falls: Secondary | ICD-10-CM

## 2018-05-11 DIAGNOSIS — I10 Essential (primary) hypertension: Secondary | ICD-10-CM | POA: Diagnosis not present

## 2018-05-11 DIAGNOSIS — Z8673 Personal history of transient ischemic attack (TIA), and cerebral infarction without residual deficits: Secondary | ICD-10-CM | POA: Diagnosis not present

## 2018-05-11 DIAGNOSIS — M810 Age-related osteoporosis without current pathological fracture: Secondary | ICD-10-CM

## 2018-05-11 DIAGNOSIS — R269 Unspecified abnormalities of gait and mobility: Secondary | ICD-10-CM | POA: Diagnosis not present

## 2018-05-11 MED ORDER — PREDNISONE 20 MG PO TABS: 20.0000 mg | ORAL_TABLET | ORAL | 0 refills | Status: DC | PRN

## 2018-05-11 NOTE — Patient Instructions (Signed)
     IF you received an x-ray today, you will receive an invoice from [facility]. Please contact [facility] at [phone] with questions or concerns regarding your invoice.   IF you received labwork today, you will receive an invoice from [facility]. Please contact [facility] at [phone] with questions or concerns regarding your invoice.   Our billing staff will not be able to assist you with questions regarding bills from these companies.  You will be contacted with the lab results as soon as they are available. The fastest way to get your results is to activate your My Chart account. Instructions are located on the last page of this paperwork. If you have not heard from us regarding the results in 2 weeks, please contact this office.     

## 2018-05-11 NOTE — Progress Notes (Signed)
Subjective:    Patient ID: [name], male    DOB: 1932-03-01, 84 y.o.   MRN: [id number]  05/11/2018  Hypertension (2 month follow-up ) and Fall    HPI This 84 y.o. male presents with daughter for two month follow-up of recent CVA, hypertension, dyslipidemia, osteopenia with previous hip fracture, frequent falls, eczema.  Patient reports doing well since last visit yet daughter reports recent fall when traveling to visit family.  Patient was in shower and slipped; family member had to assist him to get up.  No major injuries with that fall.  Has been receiving physical therapy at home and has significantly improved ambulation with assistance physical therapist.  Should complete physical therapy this week.  Eczema seems to be most significant health issue for patient.  Daughter reports that patient is only sponge bathing and she feels he would benefit from taking a true bath to remove scaling skin.  Last dermatology evaluation 10 years ago.  Patient takes prednisone orally with acute exacerbations which is significantly helpful.  Daughter is requesting a refill of prednisone which was last provided to patient in May 2019; daughter reports the patient has for prednisone tablets left at 15.  Patient is very resistant to receiving help with bathing from CMA that comes to his house every morning.  He does not want to be bathed to like a child.  Patient did struggle this morning while shaving.  He was falling to the left side as he was shaving with his right hand.  He denies focal left-sided weakness yet struggled to have the strength maintain an upright position while shaving this morning.  BP Readings from Last 3 Encounters:  05/11/18 130/82  03/11/18 118/72  02/24/18 121/78   Wt Readings from Last 3 Encounters:  05/11/18 145 lb (65.8 kg)  03/11/18 146 lb (66.2 kg)  02/24/18 145 lb (65.8 kg)   Immunization History  Administered Date(s) Administered  . Influenza, Seasonal, Injecte, Preservative  Fre 10/14/2012  . Influenza,inj,Quad PF,6+ Mos 12/07/2014, 08/11/2015, 09/11/2016, 08/25/2017  . Pneumococcal Conjugate-13 06/03/2014  . Pneumococcal Polysaccharide-23 04/14/2007, 09/22/2015  . Tdap 04/14/2007, 09/25/2016  . Zoster 12/06/2011    Review of Systems  Constitutional: Negative for activity change, appetite change, chills, diaphoresis, fatigue, fever and unexpected weight change.  HENT: Negative for congestion, dental problem, drooling, ear discharge, ear pain, facial swelling, hearing loss, mouth sores, nosebleeds, postnasal drip, rhinorrhea, sinus pressure, sneezing, sore throat, tinnitus, trouble swallowing and voice change.   Eyes: Negative for photophobia, pain, discharge, redness, itching and visual disturbance.  Respiratory: Negative for apnea, cough, choking, chest tightness, shortness of breath, wheezing and stridor.   Cardiovascular: Negative for chest pain, palpitations and leg swelling.  Gastrointestinal: Negative for abdominal pain, blood in stool, constipation, diarrhea, nausea and vomiting.  Endocrine: Negative for cold intolerance, heat intolerance, polydipsia, polyphagia and polyuria.  Genitourinary: Negative for decreased urine volume, difficulty urinating, discharge, dysuria, enuresis, flank pain, frequency, genital sores, hematuria, penile pain, penile swelling, scrotal swelling, testicular pain and urgency.  Musculoskeletal: Negative for arthralgias, back pain, gait problem, joint swelling, myalgias, neck pain and neck stiffness.  Skin: Positive for rash. Negative for color change, pallor and wound.  Allergic/Immunologic: Negative for environmental allergies, food allergies and immunocompromised state.  Neurological: Negative for dizziness, tremors, seizures, syncope, facial asymmetry, speech difficulty, weakness, light-headedness, numbness and headaches.  Hematological: Negative for adenopathy. Does not bruise/bleed easily.  Psychiatric/Behavioral: Negative for  agitation, behavioral problems, confusion, decreased concentration, dysphoric mood, hallucinations, self-injury, sleep disturbance  and suicidal ideas. The patient is not nervous/anxious and is not hyperactive.     Past Medical History:  Diagnosis Date  . Allergy    seasonal  fall  . Cancer ([facility])    Basal cell carcinoma; multiple  . Eczema   . Hyperlipidemia   . Hypertension   . Osteoporosis   . Stroke [facility])    Past Surgical History:  Procedure Laterality Date  . CATARACT EXTRACTION, BILATERAL  06/29/2015  . INTRAMEDULLARY (IM) NAIL INTERTROCHANTERIC Left 03/30/2017   Procedure: INTRAMEDULLARY (IM) NAIL INTERTROCHANTRIC;  Surgeon: [name], MD;  Location: [facility];  Service: Orthopedics;  Laterality: Left;  . MOHS SURGERY    . TONSILLECTOMY  1940 maybe   Allergies  Allergen Reactions  . Seasonal Ic [Cholestatin]    Current Outpatient Medications on File Prior to Visit  Medication Sig Dispense Refill  . alendronate (FOSAMAX) 70 MG tablet Take 1 tablet (70 mg total) by mouth every 7 (seven) days. Take with a full glass of water on an empty stomach. 4 tablet 11  . aspirin EC 81 MG EC tablet Take 1 tablet (81 mg total) by mouth daily. 30 tablet 0  . clobetasol ointment (TEMOVATE) 9.56 % Apply 1 application 2 (two) times daily topically. 60 g prn  . fluocinonide-emollient (LIDEX-E) 0.05 % cream Apply 1 application topically 2 (two) times daily. 30 g 5  . furosemide (LASIX) 20 MG tablet Take 1 tablet (20 mg total) daily as needed by mouth. 90 tablet 1  . metoprolol succinate (TOPROL-XL) 50 MG 24 hr tablet Take 1 tablet (50 mg total) by mouth daily. Take with or immediately following a meal. 90 tablet 1  . pravastatin (PRAVACHOL) 40 MG tablet Take 1 tablet (40 mg total) by mouth daily. 90 tablet 1  . Skin Protectants, Misc. (EUCERIN) cream Apply 1 application topically as needed for dry skin. 454 g 1   No current facility-administered medications on file prior to visit.    Social  History   Socioeconomic History  . Marital status: Married    Spouse name: Not on file  . Number of children: Not on file  . Years of education: Not on file  . Highest education level: Not on file  Occupational History  . Occupation: retired  Scientific laboratory technician  . Financial resource strain: Not on file  . Food insecurity:    Worry: Not on file    Inability: Not on file  . Transportation needs:    Medical: Not on file    Non-medical: Not on file  Tobacco Use  . Smoking status: Never Smoker  . Smokeless tobacco: Never Used  Substance and Sexual Activity  . Alcohol use: No    Comment: occas  . Drug use: No  . Sexual activity: Not Currently  Lifestyle  . Physical activity:    Days per week: Not on file    Minutes per session: Not on file  . Stress: Not on file  Relationships  . Social connections:    Talks on phone: Not on file    Gets together: Not on file    Attends religious service: Not on file    Active member of club or organization: Not on file    Attends meetings of clubs or organizations: Not on file    Relationship status: Not on file  . Intimate partner violence:    Fear of current or ex partner: Not on file    Emotionally abused: Not on file  Physically abused: Not on file    Forced sexual activity: Not on file  Other Topics Concern  . Not on file  Social History Narrative   Marital status: divorced; good friend with ex-wife; not dating      Children: 2 children; 4 grandchildren; no gg      Lives:  Alone in house; daughter six blocks away      Employment: college professor; retired in 1997; history; [place]:  Never      Alcohol: tequila loves but won't buy it.      Exercise:  As much as can; previous competitive biking.      ADLs: quit mowing grass in 2017; no assistant devices; cooks and cleans and pays bills.  No driving; HAS NEVER DRIVEN; has always ridden bike.  Daughter takes patient to grocery store and to appointments.  Daughter  drives patient to grocery store.      Advanced Directives:  No living; DNR; do not resuscitate.  HCPOA: [name] Cummings   Family History  Problem Relation Age of Onset  . Arthritis Mother   . Heart disease Mother 60  . Hypertension Father        Objective:    BP 130/82   Pulse 77   Temp 98 F (36.7 C) (Oral)   Resp 16   Ht 5\' 7"  (1.702 m)   Wt 145 lb (65.8 kg)   SpO2 94%   BMI 22.71 kg/m  Physical Exam  Constitutional: He is oriented to person, place, and time. He appears well-developed and well-nourished. No distress.  HENT:  Head: Normocephalic and atraumatic.  Right Ear: External ear normal.  Left Ear: External ear normal.  Nose: Nose normal.  Mouth/Throat: Oropharynx is clear and moist.  Eyes: Pupils are equal, round, and reactive to light. Conjunctivae and EOM are normal. Right eye exhibits discharge. Left eye exhibits discharge. Right conjunctiva is not injected. Right conjunctiva has no hemorrhage. Left conjunctiva is not injected. Left conjunctiva has no hemorrhage.  Tearing from bilateral eyes.  Small amount of exudate in the inferior aspect of bilateral eyes.  Neck: Normal range of motion. Neck supple. Carotid bruit is not present. No thyromegaly present.  Cardiovascular: Normal rate, regular rhythm, normal heart sounds and intact distal pulses. Exam reveals no gallop and no friction rub.  No murmur heard. Pulmonary/Chest: Effort normal and breath sounds normal. He has no wheezes. He has no rales.  Abdominal: Soft. Bowel sounds are normal. He exhibits no distension and no mass. There is no tenderness. There is no rebound and no guarding.  Lymphadenopathy:    He has no cervical adenopathy.  Neurological: He is alert and oriented to person, place, and time. No cranial nerve deficit or sensory deficit. He exhibits normal muscle tone.  Skin: Skin is warm and dry. Rash noted. He is not diaphoretic.  Excoriations of bilateral forearms present.  Mild scaling of extremities  as well as back however improved from previous visits.  Psychiatric: He has a normal mood and affect. His behavior is normal.  Nursing note and vitals reviewed.  No results found. Depression screen [facility] 2/9 05/11/2018 03/11/2018 02/14/2018 01/16/2018 09/10/2017  Decreased Interest 0 0 0 0 0  Down, Depressed, Hopeless 0 0 0 0 0  PHQ - 2 Score 0 0 0 0 0   Fall Risk  05/11/2018 03/11/2018 02/24/2018 02/14/2018 01/16/2018  Falls in the past year? Yes Yes Yes Yes No  Comment - - - - -  Number falls in past yr: 1 2 or more 2 or more 1 -  Injury with Fall? No - No No -  Comment - - - - -  Risk Factor Category  High Fall Risk - - - -  Risk for fall due to : - - - - -  Follow up - - - - -        Assessment & Plan:   1. History of CVA (cerebrovascular accident)   2. PVC (premature ventricular contraction)   3. Benign essential HTN   4. Intrinsic eczema   5. Age-related osteoporosis without current pathological fracture   6. Leg edema   7. Abnormality of gait   8. Recurrent falls     History of CVA recently with recurrent falls: Overall stable within home at this time.  Will complete physical therapy this week.  Daughter has CNA who assist patient for approximately 4 hours/day throughout the week.  Patient has excellent support from daughter.  Patient does continue to live alone yet has this excellent support.  Highly recommend patient get a shower chair for bathing every other day.  Advised patient that he must have assistance getting into the shower and out of the shower.  Hypertension with PVCs and leg edema: Well-controlled at this time with metoprolol therapy and as needed Lasix therapy.  Recent labs in May within normal limits thus will defer labs today.  Patient has refused aggressive cardiac evaluation in the past with the PVCs and edema.  He is currently asymptomatic.  Eczema: Moderately controlled.  Recommend bathing every other day at most yet do encourage getting in the shower to remove  dead skin.  I am providing a refill of prednisone yet have advised patient and daughter of the risk of recurrent hip fracture and worsening osteoporosis with frequent prednisone therapy.  I highly recommend dermatology consultation yet patient is not interested in seeing specialist at this time in his life.  Continue topical steroid cream with Eucerin.  CNA will assist with applying topical creams for eczema.  Osteoporosis with hip fracture in the past year: Continue Fosamax weekly.  Recommend calcium 3 servings daily.  Continue to be cautious regarding fall risk.  No orders of the defined types were placed in this encounter.  Meds ordered this encounter  Medications  . predniSONE (DELTASONE) 20 MG tablet    Sig: Take 1 tablet (20 mg total) by mouth as needed. For 5 days then one tablet daily x 5 days    Dispense:  20 tablet    Refill:  0    Return in about 6 months (around 11/11/2018) for follow-up chronic medical conditions [place].   Jaiona Simien Elayne Guerin, M.D. Primary Care at [facility] [address] [place], [place]  [phone] phone [phone] fax

## 2018-05-12 DIAGNOSIS — R296 Repeated falls: Secondary | ICD-10-CM | POA: Insufficient documentation

## 2018-07-04 ENCOUNTER — Emergency Department (HOSPITAL_COMMUNITY)
Admission: EM | Admit: 2018-07-04 | Discharge: 2018-07-04 | Disposition: A | Discharge status: Home / Self Care | Payer: Medicare Other | Attending: Emergency Medicine | Admitting: Emergency Medicine

## 2018-07-04 ENCOUNTER — Other Ambulatory Visit: Payer: Self-pay

## 2018-07-04 ENCOUNTER — Encounter (HOSPITAL_COMMUNITY): Payer: Self-pay

## 2018-07-04 ENCOUNTER — Emergency Department (HOSPITAL_COMMUNITY): Payer: Medicare Other

## 2018-07-04 DIAGNOSIS — Z85828 Personal history of other malignant neoplasm of skin: Secondary | ICD-10-CM | POA: Insufficient documentation

## 2018-07-04 DIAGNOSIS — S59911A Unspecified injury of right forearm, initial encounter: Secondary | ICD-10-CM | POA: Diagnosis not present

## 2018-07-04 DIAGNOSIS — G5631 Lesion of radial nerve, right upper limb: Secondary | ICD-10-CM | POA: Diagnosis not present

## 2018-07-04 DIAGNOSIS — Y999 Unspecified external cause status: Secondary | ICD-10-CM | POA: Diagnosis not present

## 2018-07-04 DIAGNOSIS — I129 Hypertensive chronic kidney disease with stage 1 through stage 4 chronic kidney disease, or unspecified chronic kidney disease: Secondary | ICD-10-CM | POA: Diagnosis not present

## 2018-07-04 DIAGNOSIS — S60211A Contusion of right wrist, initial encounter: Secondary | ICD-10-CM | POA: Insufficient documentation

## 2018-07-04 DIAGNOSIS — Z8673 Personal history of transient ischemic attack (TIA), and cerebral infarction without residual deficits: Secondary | ICD-10-CM | POA: Diagnosis not present

## 2018-07-04 DIAGNOSIS — Z7982 Long term (current) use of aspirin: Secondary | ICD-10-CM | POA: Insufficient documentation

## 2018-07-04 DIAGNOSIS — W010XXA Fall on same level from slipping, tripping and stumbling without subsequent striking against object, initial encounter: Secondary | ICD-10-CM | POA: Diagnosis not present

## 2018-07-04 DIAGNOSIS — Z79899 Other long term (current) drug therapy: Secondary | ICD-10-CM | POA: Diagnosis not present

## 2018-07-04 DIAGNOSIS — S0990XA Unspecified injury of head, initial encounter: Secondary | ICD-10-CM | POA: Diagnosis not present

## 2018-07-04 DIAGNOSIS — R200 Anesthesia of skin: Secondary | ICD-10-CM | POA: Insufficient documentation

## 2018-07-04 DIAGNOSIS — N189 Chronic kidney disease, unspecified: Secondary | ICD-10-CM | POA: Diagnosis not present

## 2018-07-04 DIAGNOSIS — Y92008 Other place in unspecified non-institutional (private) residence as the place of occurrence of the external cause: Secondary | ICD-10-CM | POA: Diagnosis not present

## 2018-07-04 DIAGNOSIS — Y9301 Activity, walking, marching and hiking: Secondary | ICD-10-CM | POA: Insufficient documentation

## 2018-07-04 DIAGNOSIS — M79631 Pain in right forearm: Secondary | ICD-10-CM | POA: Diagnosis not present

## 2018-07-04 LAB — CBG MONITORING, ED: Glucose-Capillary: 80 mg/dL (ref 70–99)

## 2018-07-04 LAB — DIFFERENTIAL
ABS IMMATURE GRANULOCYTES: 0.1 10*3/uL (ref 0.0–0.1)
BASOS PCT: 1 %
Basophils Absolute: 0.1 10*3/uL (ref 0.0–0.1)
EOS ABS: 0 10*3/uL (ref 0.0–0.7)
Eosinophils Relative: 0 %
IMMATURE GRANULOCYTES: 0 %
Lymphocytes Relative: 13 %
Lymphs Abs: 1.7 10*3/uL (ref 0.7–4.0)
Monocytes Absolute: 1.2 10*3/uL — ABNORMAL HIGH (ref 0.1–1.0)
Monocytes Relative: 9 %
NEUTROS PCT: 77 %
Neutro Abs: 10.7 10*3/uL — ABNORMAL HIGH (ref 1.7–7.7)

## 2018-07-04 LAB — COMPREHENSIVE METABOLIC PANEL
ALBUMIN: 3.6 g/dL (ref 3.5–5.0)
ALT: 16 U/L (ref 0–44)
ANION GAP: 11 (ref 5–15)
AST: 44 U/L — AB (ref 15–41)
Alkaline Phosphatase: 58 U/L (ref 38–126)
BUN: 16 mg/dL (ref 8–23)
CHLORIDE: 105 mmol/L (ref 98–111)
CO2: 25 mmol/L (ref 22–32)
Calcium: 9 mg/dL (ref 8.9–10.3)
Creatinine, Ser: 1.24 mg/dL (ref 0.61–1.24)
GFR calc Af Amer: 60 mL/min — ABNORMAL LOW (ref >60)
GFR, EST NON AFRICAN AMERICAN: 52 mL/min — AB (ref >60)
GLUCOSE: 104 mg/dL — AB (ref 70–99)
POTASSIUM: 4.1 mmol/L (ref 3.5–5.1)
Sodium: 141 mmol/L (ref 135–145)
Total Bilirubin: 1.2 mg/dL (ref 0.3–1.2)
Total Protein: 6.2 g/dL — ABNORMAL LOW (ref 6.5–8.1)

## 2018-07-04 LAB — CBC
HCT: 43.9 % (ref 39.0–52.0)
Hemoglobin: 14.5 g/dL (ref 13.0–17.0)
MCH: 31.9 pg (ref 26.0–34.0)
MCHC: 33 g/dL (ref 30.0–36.0)
MCV: 96.7 fL (ref 78.0–100.0)
PLATELETS: 162 10*3/uL (ref 150–400)
RBC: 4.54 MIL/uL (ref 4.22–5.81)
RDW: 12.7 % (ref 11.5–15.5)
WBC: 13.8 10*3/uL — AB (ref 4.0–10.5)

## 2018-07-04 LAB — I-STAT CHEM 8, ED
BUN: 20 mg/dL (ref 8–23)
CALCIUM ION: 1.07 mmol/L — AB (ref 1.15–1.40)
Chloride: 105 mmol/L (ref 98–111)
Creatinine, Ser: 1.2 mg/dL (ref 0.61–1.24)
GLUCOSE: 99 mg/dL (ref 70–99)
HEMATOCRIT: 42 % (ref 39.0–52.0)
Hemoglobin: 14.3 g/dL (ref 13.0–17.0)
Potassium: 3.9 mmol/L (ref 3.5–5.1)
SODIUM: 139 mmol/L (ref 135–145)
TCO2: 27 mmol/L (ref 22–32)

## 2018-07-04 LAB — APTT: APTT: 29 s (ref 24–36)

## 2018-07-04 LAB — PROTIME-INR
INR: 1.06
PROTHROMBIN TIME: 13.7 s (ref 11.4–15.2)

## 2018-07-04 LAB — CK TOTAL AND CKMB (NOT AT ARMC)
CK, MB: 20.5 ng/mL — AB (ref 0.5–5.0)
Relative Index: 1.4 (ref 0.0–2.5)
Total CK: 1510 U/L — ABNORMAL HIGH (ref 49–397)

## 2018-07-04 LAB — I-STAT TROPONIN, ED: TROPONIN I, POC: 0.05 ng/mL (ref 0.00–0.08)

## 2018-07-04 MED ORDER — SODIUM CHLORIDE 0.9 % IV BOLUS
1000.0000 mL | Freq: Once | INTRAVENOUS | Status: AC
  Administered 2018-07-04: 1000 mL via INTRAVENOUS

## 2018-07-04 NOTE — ED Provider Notes (Signed)
[facility] EMERGENCY DEPARTMENT Provider Note  CSN: [id number] Arrival date & time: 07/04/18  1412  History   Chief Complaint Chief Complaint  Patient presents with  . Arm Injury  . Fall   HPI [name] is a 84 y.o. male with a medical history of stroke (dx in 12/2017), HTN and OP who presented to the ED following a fall. Patient states that he fell in his home around 1am this morning. He reports tripping on his way to the bathroom. He denies head trauma or LOC. Denies preceding symptoms of headache, dizziness, lightheadedness, vision changes, extremity weakness or paresthesias. Denies chest pain, SOB or palpitations. Patient has history of recurrent falls and gait instability. Patient currently complains of inability to extend any of his right digits which he reports began after the fall. Denies pain, paresthesias, color or temperature change in his right upper extremity. Denies facial droop, slurred speech or change in his gait. Patient has tried OTC Aleve x1 prior to coming to the ED and was also seen at an ortho urgent care who referred him to the ED.  Additional history obtained by patient's daughter who found the patient down approx. 9 hours after the fall. She states that patient was found prone on the ground laying comfortable and was conscious. She notices some forearm swelling that has increased since this morning. The daughter does not notice any change in patient's neuro or mental status from when she last saw him yesterday 07/03/18.  Past Medical History:  Diagnosis Date  . Allergy    seasonal  fall  . Cancer ([place])    Basal cell carcinoma; multiple  . Eczema   . Hyperlipidemia   . Hypertension   . Osteoporosis   . Stroke [facility])     Patient Active Problem List   Diagnosis Date Noted  . Recurrent falls 05/12/2018  . History of CVA (cerebrovascular accident) 01/16/2018  . Nocturnal enuresis   . Fall   . Full incontinence of feces   . Benign essential HTN    . Hypoalbuminemia due to protein-calorie malnutrition ([place])   . Abnormality of gait   . Leg edema   . Leukocytosis 03/29/2017  . Mild intermittent asthma with acute exacerbation 01/29/2017  . Chronic renal insufficiency 09/22/2015  . Tachycardia 09/22/2015  . PVC (premature ventricular contraction) 09/22/2015  . Osteoporosis 06/03/2014  . Basal cell carcinoma of face 06/03/2014  . Benign prostatic hyperplasia 05/27/2013  . Eczema 04/23/2012  . Dyslipidemia 04/23/2012    Past Surgical History:  Procedure Laterality Date  . CATARACT EXTRACTION, BILATERAL  06/29/2015  . INTRAMEDULLARY (IM) NAIL INTERTROCHANTERIC Left 03/30/2017   Procedure: INTRAMEDULLARY (IM) NAIL INTERTROCHANTRIC;  Surgeon: [name], MD;  Location: [place];  Service: Orthopedics;  Laterality: Left;  . MOHS SURGERY    . TONSILLECTOMY  1940 maybe        Home Medications    Prior to Admission medications   Medication Sig Start Date End Date Taking? Authorizing Provider  alendronate (FOSAMAX) 70 MG tablet Take 1 tablet (70 mg total) by mouth every 7 (seven) days. Take with a full glass of water on an empty stomach. 02/14/18   Wardell Honour, MD  aspirin EC 81 MG EC tablet Take 1 tablet (81 mg total) by mouth daily. 01/21/18   Velvet Bathe, MD  clobetasol ointment (TEMOVATE) 6.31 % Apply 1 application 2 (two) times daily topically. 09/10/17   Wardell Honour, MD  fluocinonide-emollient (LIDEX-E) 0.05 % cream Apply  1 application topically 2 (two) times daily. 02/14/18   Wardell Honour, MD  furosemide (LASIX) 20 MG tablet Take 1 tablet (20 mg total) daily as needed by mouth. 09/10/17   Wardell Honour, MD  metoprolol succinate (TOPROL-XL) 50 MG 24 hr tablet Take 1 tablet (50 mg total) by mouth daily. Take with or immediately following a meal. 02/14/18   Wardell Honour, MD  pravastatin (PRAVACHOL) 40 MG tablet Take 1 tablet (40 mg total) by mouth daily. 02/14/18   Wardell Honour, MD  predniSONE (DELTASONE) 20 MG  tablet Take 1 tablet (20 mg total) by mouth as needed. For 5 days then one tablet daily x 5 days 05/11/18   Wardell Honour, MD  Skin Protectants, Misc. (EUCERIN) cream Apply 1 application topically as needed for dry skin. 02/18/18   Wardell Honour, MD    Family History Family History  Problem Relation Age of Onset  . Arthritis Mother   . Heart disease Mother 10  . Hypertension Father     Social History Social History   Tobacco Use  . Smoking status: Never Smoker  . Smokeless tobacco: Never Used  Substance Use Topics  . Alcohol use: No    Comment: occas  . Drug use: No     Allergies   Seasonal ic [cholestatin]   Review of Systems Review of Systems  Constitutional: Negative for chills and fever.  Eyes: Negative for visual disturbance.  Respiratory: Negative for chest tightness and shortness of breath.   Cardiovascular: Negative for chest pain, palpitations and leg swelling.  Gastrointestinal: Negative.   Genitourinary: Negative.   Musculoskeletal: Negative for arthralgias and joint swelling.  Skin:       Right forearm swelling  Neurological: Positive for tremors. Negative for dizziness, facial asymmetry, speech difficulty, weakness, light-headedness and numbness.  Hematological: Bruises/bleeds easily.  Psychiatric/Behavioral: Negative for confusion and decreased concentration.   Physical Exam Updated Vital Signs BP 139/90 (BP Location: Right Arm)   Pulse 81   Temp 98 F (36.7 C) (Oral)   Resp 12   SpO2 98%   Physical Exam  Constitutional:  Thin. Chronic ill appearance.  HENT:  Head: Normocephalic and atraumatic.  Mouth/Throat: Uvula is midline, oropharynx is clear and moist and mucous membranes are normal.  Eyes: Pupils are equal, round, and reactive to light. Conjunctivae, EOM and lids are normal.  Neck: Normal range of motion and full passive range of motion without pain. Neck supple. No spinous process tenderness and no muscular tenderness present. Normal  range of motion present.  Musculoskeletal:       Right shoulder: Normal.       Right elbow: Normal.      Right wrist: He exhibits normal range of motion, no tenderness, no bony tenderness and no swelling.       Right forearm: He exhibits swelling. He exhibits no tenderness and no bony tenderness.       Right hand: He exhibits decreased range of motion. He exhibits no tenderness, no bony tenderness and normal capillary refill. Decreased strength noted. He exhibits finger abduction and thumb/finger opposition.  Right wrist drop and patient unable to extend fingers of right hand without assistance. Able to flex digits without issue. 5/5 grip strength. Right wrist, elbow and shoulder ROMs are intact.  Neurological: He is alert. He displays no atrophy. No cranial nerve deficit or sensory deficit. He exhibits abnormal muscle tone.  Reflex Scores:      Bicep reflexes are 2+ on  the right side and 2+ on the left side.      Brachioradialis reflexes are 2+ on the right side and 2+ on the left side. Skin: Skin is warm. Capillary refill takes 2 to 3 seconds. Bruising noted.  Psychiatric: He has a normal mood and affect. His speech is normal and behavior is normal. Thought content normal. Cognition and memory are normal.  Nursing note and vitals reviewed.  ED Treatments / Results  Labs (all labs ordered are listed, but only abnormal results are displayed) Labs Reviewed  CBC - Abnormal; Notable for the following components:      Result Value   WBC 13.8 (*)    All other components within normal limits  DIFFERENTIAL - Abnormal; Notable for the following components:   Neutro Abs 10.7 (*)    Monocytes Absolute 1.2 (*)    All other components within normal limits  COMPREHENSIVE METABOLIC PANEL - Abnormal; Notable for the following components:   Glucose, Bld 104 (*)    Total Protein 6.2 (*)    AST 44 (*)    GFR calc non Af Amer 52 (*)    GFR calc Af Amer 60 (*)    All other components within normal  limits  CK TOTAL AND CKMB (NOT AT [facility]) - Abnormal; Notable for the following components:   Total CK 1,510 (*)    CK, MB 20.5 (*)    All other components within normal limits  I-STAT CHEM 8, ED - Abnormal; Notable for the following components:   Calcium, Ion 1.07 (*)    All other components within normal limits  PROTIME-INR  APTT  I-STAT TROPONIN, ED  CBG MONITORING, ED    EKG None  Radiology Dg Forearm Right  Result Date: 07/04/2018 CLINICAL DATA:  Right forearm pain following a fall yesterday. EXAM: RIGHT FOREARM - 2 VIEW COMPARISON:  None. FINDINGS: Diffuse osteopenia. No fracture or dislocation seen. IMPRESSION: No fracture or dislocation. Electronically Signed   By: Claudie Revering M.D.   On: 07/04/2018 16:26   Ct Head Wo Contrast  Result Date: 07/04/2018 CLINICAL DATA:  Right hand numbness. [place] and hit his head last night. EXAM: CT HEAD WITHOUT CONTRAST TECHNIQUE: Contiguous axial images were obtained from the base of the skull through the vertex without intravenous contrast. COMPARISON:  Brain MR dated 01/17/2018 and head CT dated 01/16/2018. FINDINGS: Brain: Old right caudate/basal ganglia infarct. This corresponds to the previous acute infarct. Small, old bilateral cerebellar hemisphere infarcts, unchanged. Mild patchy low density in the pons without significant change. Diffusely enlarged ventricles and subarachnoid spaces. Patchy white matter low density in both cerebral hemispheres. No intracranial hemorrhage, mass lesion or CT evidence of acute infarction. Vascular: No hyperdense vessel or unexpected calcification. Skull: Normal. Negative for fracture or focal lesion. Sinuses/Orbits: Status post bilateral cataract extraction. Unremarkable included paranasal sinuses. Other: None. IMPRESSION: 1. No acute abnormality. 2. Old right caudate/basal ganglia and bilateral cerebellar hemisphere infarcts. 3. Stable atrophy and chronic small vessel white matter ischemic changes. Electronically  Signed   By: Claudie Revering M.D.   On: 07/04/2018 18:14    Procedures Procedures (including critical care time)  Medications Ordered in ED Medications  sodium chloride 0.9 % bolus 1,000 mL (1,000 mLs Intravenous New Bag/Given 07/04/18 1819)     Initial Impression / Assessment and Plan / ED Course  Triage vital signs and the nursing notes have been reviewed.  Pertinent labs & imaging results that were available during care of the patient were reviewed  and considered in medical decision making (see chart for details).  Patient presents to the ED with right wrist drop and impaired finger flexion following a fall where he landed on his right arm and was laying on it for ~ 8-9 hours before being found by his daughter. Physical exam only significant for right forearm bruising and swelling, wrist drop and decreased finger extension. No focal neuro deficits. He has no other neuro complaints preceding the fall or on ED arrival. Clinical presentation consistent with a radial nerve palsy. However, unclear cause of fall and patient was down on the ground for an extended period of time unmonitored. With history of stroke, will evaluate for an acute CVA as well as obtain imaging of patient's forearm.  Clinical Course as of Jul 04 1932  Sat Jul 04, 2018  1558 Case discussed with Dr. Pattricia Boss. Will proceed with stroke work-up given pt's recent fall, last known normal unknown and stroke history with new neuro findings.   [GM]  6160 Right forearm x-ray does not show any fractures or dislocations of the elbow or wrist.   [GM]  1705 Elevated CK of > 1500 indicative of rhabdomyolysis. No associated AKI. Will begin fluid replenishment with NS bolus x1. Mild leukocytosis likely indicative of acute stress reaction to injury. Remaining labs unremarkable.   [GM]  7371 CT head shows no acute findings such as hemorrhages or areas of new infarct/ischemia. No skull fractures. Evidence of chronic small vessel ischemic  changes and past stroke.   [GM]  1918 Case discussed with neuro hospitalist, Dr. Leonel Ramsay. Agreed with initial diagnosis of radial nerve injury. No additional intervention needed. Patient can follow-up with neuro as outpatient.   [GM]    Clinical Course User Index [GM] Delano Frate, Jonelle Sports, PA-C   Final Clinical Impressions(s) / ED Diagnoses  1. Radial Nerve Palsy. Advised follow-up with PCP. Education provided on OTC and supportive treatment.  Dispo: Home. After thorough clinical evaluation, this patient is determined to be medically stable and can be safely discharged with the previously mentioned treatment and/or outpatient follow-up/referral(s). At this time, there are no other apparent medical conditions that require further screening, evaluation or treatment.   Final diagnoses:  Radial nerve palsy, right    ED Discharge Orders    None        Junita Push 07/04/18 1933    Pattricia Boss, MD 07/06/18 2056

## 2018-07-04 NOTE — Discharge Instructions (Addendum)
Your work-up today was normal. On the head scan, there are no signs of a new stroke or brain bleeding. Your x-ray of the arm was fine too. No broken bones or dislocations.  The wrist drop and issues with your fingers is coming from injury to the radial nerve. This will resolve on its own over time. You may follow-up with your PCP in 1-2 weeks to have this re-evaluated.  Thank you for allowing [place] to take care of you today!

## 2018-07-04 NOTE — ED Triage Notes (Signed)
Pt presents for evaluation of R arm injury after fall last night. Pt was seen at ortho urgent care and told he had R radial nerve palsy. Sent to ED for further eval. Pt has good movement and pms of wrist but limited movement of fingers. No other injuries.

## 2018-07-27 ENCOUNTER — Ambulatory Visit (INDEPENDENT_AMBULATORY_CARE_PROVIDER_SITE_OTHER): Payer: Medicare Other | Admitting: Family Medicine

## 2018-07-27 ENCOUNTER — Other Ambulatory Visit: Payer: Self-pay

## 2018-07-27 ENCOUNTER — Encounter: Payer: Self-pay | Admitting: Family Medicine

## 2018-07-27 VITALS — BP 140/78 | HR 72 | Temp 97.6°F | Resp 18 | Ht 67.05 in | Wt 152.0 lb

## 2018-07-27 DIAGNOSIS — Z23 Encounter for immunization: Secondary | ICD-10-CM

## 2018-07-27 DIAGNOSIS — D492 Neoplasm of unspecified behavior of bone, soft tissue, and skin: Secondary | ICD-10-CM | POA: Diagnosis not present

## 2018-07-27 DIAGNOSIS — R000 Tachycardia, unspecified: Secondary | ICD-10-CM | POA: Diagnosis not present

## 2018-07-27 DIAGNOSIS — G5631 Lesion of radial nerve, right upper limb: Secondary | ICD-10-CM | POA: Diagnosis not present

## 2018-07-27 DIAGNOSIS — I10 Essential (primary) hypertension: Secondary | ICD-10-CM

## 2018-07-27 DIAGNOSIS — Z7409 Other reduced mobility: Secondary | ICD-10-CM

## 2018-07-27 DIAGNOSIS — Z8673 Personal history of transient ischemic attack (TIA), and cerebral infarction without residual deficits: Secondary | ICD-10-CM | POA: Diagnosis not present

## 2018-07-27 DIAGNOSIS — R296 Repeated falls: Secondary | ICD-10-CM

## 2018-07-27 DIAGNOSIS — L2084 Intrinsic (allergic) eczema: Secondary | ICD-10-CM

## 2018-07-27 MED ORDER — WALKER MISC: 0 refills | Status: AC

## 2018-07-27 MED ORDER — ALENDRONATE SODIUM 70 MG PO TABS: 70.0000 mg | ORAL_TABLET | ORAL | 11 refills | Status: DC

## 2018-07-27 MED ORDER — CLOBETASOL PROPIONATE 0.05 % EX OINT: 1.0000 "application " | TOPICAL_OINTMENT | Freq: Two times a day (BID) | CUTANEOUS | 99 refills | Status: DC

## 2018-07-27 MED ORDER — PREDNISONE 20 MG PO TABS: 40.0000 mg | ORAL_TABLET | Freq: Every day | ORAL | 1 refills | Status: DC

## 2018-07-27 MED ORDER — METOPROLOL SUCCINATE ER 50 MG PO TB24: 50.0000 mg | ORAL_TABLET | Freq: Every day | ORAL | 1 refills | Status: DC

## 2018-07-27 MED ORDER — PRAVASTATIN SODIUM 40 MG PO TABS: 40.0000 mg | ORAL_TABLET | Freq: Every day | ORAL | 1 refills | Status: DC

## 2018-07-27 MED ORDER — FLUOCINONIDE-E 0.05 % EX CREA: 1.0000 "application " | TOPICAL_CREAM | Freq: Two times a day (BID) | CUTANEOUS | 5 refills | Status: DC

## 2018-07-27 MED ORDER — PREDNISONE 20 MG PO TABS: 20.0000 mg | ORAL_TABLET | ORAL | 0 refills | Status: DC | PRN

## 2018-07-27 NOTE — Progress Notes (Signed)
9/30/20194:42 PM  [name] 1932/04/27, 84 y.o. male [id number]  Chief Complaint  Patient presents with  . Hospitalization Follow-up    due to a fall- right hand pain  . Mass    X  3 weeks- On Chest   . Medication Refill    prednisone    HPI:   Patient is a 84 y.o. male with past medical history significant for HTN, asthma, BPH, CKD, HLP, CVA who presents today for ER followup  Previous pcp dr Morton Stall visit July 2019 Refilled pred then  ER on 07/04/2018 - tripped and fell, could not get up, radial nerve palsy with CK 1500 and stable crt Right wrist drop, not much improved, maybe fingers not as clawed Right handed Uses walker, needs new one, getting hard to fold, rusty  Lab Results  Component Value Date   CREATININE 1.20 07/04/2018  GFR 52  Fall Risk  07/27/2018 05/11/2018 03/11/2018 02/24/2018 02/14/2018  Falls in the past year? Yes Yes Yes Yes Yes  Comment - - - - -  Number falls in past yr: 2 or more 1 2 or more 2 or more 1  Injury with Fall? Yes No - No No  Comment - - - - -  Risk Factor Category  - High Fall Risk - - -  Risk for fall due to : - - - - -  Follow up - - - - -     Depression screen [facility] 2/9 07/27/2018 05/11/2018 03/11/2018  Decreased Interest 0 0 0  Down, Depressed, Hopeless 0 0 0  PHQ - 2 Score 0 0 0    Allergies  Allergen Reactions  . Seasonal Ic [Cholestatin]     Prior to Admission medications   Medication Sig Start Date End Date Taking? Authorizing Provider  alendronate (FOSAMAX) 70 MG tablet Take 1 tablet (70 mg total) by mouth every 7 (seven) days. Take with a full glass of water on an empty stomach. 02/14/18  Yes Wardell Honour, MD  aspirin EC 81 MG EC tablet Take 1 tablet (81 mg total) by mouth daily. 01/21/18  Yes Velvet Bathe, MD  clobetasol ointment (TEMOVATE) 3.29 % Apply 1 application 2 (two) times daily topically. 09/10/17  Yes Wardell Honour, MD  fluocinonide-emollient (LIDEX-E) 0.05 % cream Apply 1 application topically 2 (two)  times daily. 02/14/18  Yes Wardell Honour, MD  metoprolol succinate (TOPROL-XL) 50 MG 24 hr tablet Take 1 tablet (50 mg total) by mouth daily. Take with or immediately following a meal. 02/14/18  Yes Wardell Honour, MD  pravastatin (PRAVACHOL) 40 MG tablet Take 1 tablet (40 mg total) by mouth daily. 02/14/18  Yes Wardell Honour, MD  predniSONE (DELTASONE) 20 MG tablet Take 1 tablet (20 mg total) by mouth as needed. For 5 days then one tablet daily x 5 days 05/11/18  Yes Wardell Honour, MD  Skin Protectants, Misc. (EUCERIN) cream Apply 1 application topically as needed for dry skin. 02/18/18  Yes Wardell Honour, MD    Past Medical History:  Diagnosis Date  . Allergy    seasonal  fall  . Cancer ([place])    Basal cell carcinoma; multiple  . Eczema   . Hyperlipidemia   . Hypertension   . Osteoporosis   . Stroke [facility])     Past Surgical History:  Procedure Laterality Date  . CATARACT EXTRACTION, BILATERAL  06/29/2015  . INTRAMEDULLARY (IM) NAIL INTERTROCHANTERIC Left 03/30/2017   Procedure: INTRAMEDULLARY (IM) NAIL INTERTROCHANTRIC;  Surgeon: [name], MD;  Location: [place];  Service: Orthopedics;  Laterality: Left;  . MOHS SURGERY    . TONSILLECTOMY  1940 maybe    Social History   Tobacco Use  . Smoking status: Never Smoker  . Smokeless tobacco: Never Used  Substance Use Topics  . Alcohol use: No    Comment: occas    Family History  Problem Relation Age of Onset  . Arthritis Mother   . Heart disease Mother 28  . Hypertension Father     Review of Systems  Constitutional: Negative for chills and fever.  Respiratory: Negative for cough and shortness of breath.   Cardiovascular: Negative for chest pain, palpitations and leg swelling.  Gastrointestinal: Negative for abdominal pain, nausea and vomiting.  Neurological: Positive for tingling, sensory change and focal weakness.    OBJECTIVE:  Blood pressure 140/78, pulse 72, temperature 97.6 F (36.4 C), temperature  source Oral, resp. rate 18, height 5' 7.05" (1.703 m), weight 152 lb (68.9 kg), SpO2 95 %. Body mass index is 23.77 kg/m.   Physical Exam  Constitutional: He is oriented to person, place, and time. He appears well-developed and well-nourished.  HENT:  Head: Normocephalic and atraumatic.  Right Ear: Hearing, tympanic membrane, external ear and ear canal normal.  Left Ear: Hearing, tympanic membrane, external ear and ear canal normal.  Mouth/Throat: Oropharynx is clear and moist. No oropharyngeal exudate.  Eyes: Pupils are equal, round, and reactive to light. Conjunctivae and EOM are normal.  Neck: Neck supple.  Cardiovascular: Normal rate and regular rhythm. Exam reveals no gallop and no friction rub.  No murmur heard. Pulmonary/Chest: Effort normal and breath sounds normal. He has no wheezes. He has no rales.  Musculoskeletal: He exhibits no edema.  Lymphadenopathy:    He has no cervical adenopathy.  Neurological: He is alert and oriented to person, place, and time.  Right wrist drop,   Skin: Skin is warm and dry. Lesion (middle of chest about 1cm ulcerated mildly erythematous nodule) and rash (knees, elbows, wrist with very dry skin, exorated large erythematous plaques) noted.  Psychiatric: He has a normal mood and affect.  Nursing note and vitals reviewed.     ASSESSMENT and PLAN  1. Radial nerve palsy, right - Ambulatory referral to Home Health  2. History of CVA (cerebrovascular accident) - Ambulatory referral to Home Health  3. Recurrent falls - Ambulatory referral to [place]  4. Intrinsic eczema  5. Impaired mobility - Ambulatory referral to Home Health  6. Neoplasm of skin of chest - Ambulatory referral to Dermatology  7. Essential hypertension - metoprolol succinate (TOPROL-XL) 50 MG 24 hr tablet; Take 1 tablet (50 mg total) by mouth daily. Take with or immediately following a meal.  8. Tachycardia - metoprolol succinate (TOPROL-XL) 50 MG 24 hr tablet;  Take 1 tablet (50 mg total) by mouth daily. Take with or immediately following a meal.  9. Need for immunization against influenza  Other orders   Referring to [facility] for PT/OT to help with brace and exercises as this is his dominant hand and mobility is already an issue. Also send to derm for lesion which seems to be BCC on chest. Stable chronic medical conditions. meds refilled. Flu vaccine given today  - Misc. Devices (WALKER) MISC; Use walker whenever walking, standard walker - clobetasol ointment (TEMOVATE) 0.05 %; Apply 1 application topically 2 (two) times daily. - alendronate (FOSAMAX) 70 MG tablet; Take 1 tablet (70 mg total) by mouth every 7 (  seven) days. Take with a full glass of water on an empty stomach. - fluocinonide-emollient (LIDEX-E) 0.05 % cream; Apply 1 application topically 2 (two) times daily. - pravastatin (PRAVACHOL) 40 MG tablet; Take 1 tablet (40 mg total) by mouth daily. - Flu vaccine HIGH DOSE PF (Fluzone High dose) - predniSONE (DELTASONE) 20 MG tablet; Take 2 tablets (40 mg total) by mouth daily.   Return for already scheduled.    Rutherford Guys, MD Primary Care at [place], [place] [address] Ph.  [phone] Fax [phone]

## 2018-07-27 NOTE — Patient Instructions (Signed)
° ° ° °  If you have lab work done today you will be contacted with your lab results within the next 2 weeks.  If you have not heard from us then please contact us. The fastest way to get your results is to register for My Chart. ° ° °IF you received an x-ray today, you will receive an invoice from [facility]. Please contact [facility] at [phone] with questions or concerns regarding your invoice.  ° °IF you received labwork today, you will receive an invoice from [facility]. Please contact [facility] at [phone] with questions or concerns regarding your invoice.  ° °Our billing staff will not be able to assist you with questions regarding bills from these companies. ° °You will be contacted with the lab results as soon as they are available. The fastest way to get your results is to activate your My Chart account. Instructions are located on the last page of this paperwork. If you have not heard from us regarding the results in 2 weeks, please contact this office. °  ° ° ° °

## 2018-08-28 ENCOUNTER — Telehealth: Payer: Self-pay | Admitting: General Practice

## 2018-08-28 NOTE — Telephone Encounter (Signed)
Copied from [place] [id number]. Topic: Referral - Question >> Aug 28, 2018 11:37 AM Antonieta Iba C wrote: Reason for CRM:   Pt's daughter called in to follow up on 2 referrals. Daughter says that she has spoken with [facility] and they stated that they have not received referral for home health services. Fax # for [facility] is: [phone]   Daughter would also like to have office to look into dermatology referral. Pt/daughter says that they still haven't received a call.

## 2018-08-28 NOTE — Telephone Encounter (Signed)
SPOKE TO [name] AND ADV HER THAT REFERRAL WAS RESENT TO [facility] W/FAX NUMBER SHE LEFT BECAUSE WE HAD AN 855# RESENT DERMATOLOGY REFERRAL TO A PROVIDER HE HAD SEEN A FEW YEARS AGO BECAUSE THEY HAD NOT HEARD ANYTHING FORM ORIGINAL PROVIDER REFERRAL WAS SENT TO

## 2018-09-01 ENCOUNTER — Telehealth: Payer: Self-pay | Admitting: Family Medicine

## 2018-09-01 DIAGNOSIS — R2689 Other abnormalities of gait and mobility: Secondary | ICD-10-CM | POA: Diagnosis not present

## 2018-09-01 DIAGNOSIS — M6281 Muscle weakness (generalized): Secondary | ICD-10-CM | POA: Diagnosis not present

## 2018-09-01 DIAGNOSIS — G5631 Lesion of radial nerve, right upper limb: Secondary | ICD-10-CM | POA: Diagnosis not present

## 2018-09-01 DIAGNOSIS — R296 Repeated falls: Secondary | ICD-10-CM | POA: Diagnosis not present

## 2018-09-01 DIAGNOSIS — I10 Essential (primary) hypertension: Secondary | ICD-10-CM | POA: Diagnosis not present

## 2018-09-01 DIAGNOSIS — Z8673 Personal history of transient ischemic attack (TIA), and cerebral infarction without residual deficits: Secondary | ICD-10-CM | POA: Diagnosis not present

## 2018-09-01 NOTE — Telephone Encounter (Signed)
Copied from [place] [id number]. Topic: Quick Communication - Home Health Verbal Orders >> Sep 01, 2018 12:45 PM Margot Ables wrote: Caller/Agency: Gwinda Passe PT w/[facility] Callback Number: [phone], secure VM if not available to answer Requesting OT/PT/Skilled Nursing/Social Work: [facility] was today 09/01/18, requesting VO for PT Frequency: 2x week 3 weeks, 1x week 2 weeks

## 2018-09-01 NOTE — Telephone Encounter (Signed)
pls see note. Last visit was with Dr.Smith

## 2018-09-03 ENCOUNTER — Telehealth: Payer: Self-pay | Admitting: Family Medicine

## 2018-09-03 DIAGNOSIS — M6281 Muscle weakness (generalized): Secondary | ICD-10-CM | POA: Diagnosis not present

## 2018-09-03 DIAGNOSIS — Z8673 Personal history of transient ischemic attack (TIA), and cerebral infarction without residual deficits: Secondary | ICD-10-CM | POA: Diagnosis not present

## 2018-09-03 DIAGNOSIS — R2689 Other abnormalities of gait and mobility: Secondary | ICD-10-CM | POA: Diagnosis not present

## 2018-09-03 DIAGNOSIS — R296 Repeated falls: Secondary | ICD-10-CM | POA: Diagnosis not present

## 2018-09-03 DIAGNOSIS — I10 Essential (primary) hypertension: Secondary | ICD-10-CM | POA: Diagnosis not present

## 2018-09-03 DIAGNOSIS — G5631 Lesion of radial nerve, right upper limb: Secondary | ICD-10-CM | POA: Diagnosis not present

## 2018-09-03 NOTE — Telephone Encounter (Signed)
Copied from [place] [id number]. Topic: Quick Communication - Home Health Verbal Orders >> Sep 03, 2018  3:55 PM Sheran Luz wrote: Caller/Agency: Will, [facility] Callback Number: [phone] secure VM Requesting OT/PT/Skilled Nursing/Social Work: PT  Frequency: 2w3

## 2018-09-04 NOTE — Telephone Encounter (Signed)
Returned call to Will @ [facility]. He is requesting OT 2xwk x 3 wk. Orders given.

## 2018-09-07 ENCOUNTER — Ambulatory Visit: Payer: Self-pay

## 2018-09-07 DIAGNOSIS — I10 Essential (primary) hypertension: Secondary | ICD-10-CM | POA: Diagnosis not present

## 2018-09-07 DIAGNOSIS — R062 Wheezing: Secondary | ICD-10-CM | POA: Diagnosis not present

## 2018-09-07 DIAGNOSIS — M6281 Muscle weakness (generalized): Secondary | ICD-10-CM | POA: Diagnosis not present

## 2018-09-07 DIAGNOSIS — R2689 Other abnormalities of gait and mobility: Secondary | ICD-10-CM | POA: Diagnosis not present

## 2018-09-07 DIAGNOSIS — J22 Unspecified acute lower respiratory infection: Secondary | ICD-10-CM | POA: Diagnosis not present

## 2018-09-07 DIAGNOSIS — R05 Cough: Secondary | ICD-10-CM | POA: Diagnosis not present

## 2018-09-07 DIAGNOSIS — Z8673 Personal history of transient ischemic attack (TIA), and cerebral infarction without residual deficits: Secondary | ICD-10-CM | POA: Diagnosis not present

## 2018-09-07 DIAGNOSIS — R296 Repeated falls: Secondary | ICD-10-CM | POA: Diagnosis not present

## 2018-09-07 DIAGNOSIS — G5631 Lesion of radial nerve, right upper limb: Secondary | ICD-10-CM | POA: Diagnosis not present

## 2018-09-07 DIAGNOSIS — R0602 Shortness of breath: Secondary | ICD-10-CM | POA: Diagnosis not present

## 2018-09-07 NOTE — Telephone Encounter (Signed)
Pt's daughter, [name], called back with questions; she said that the caregiver had arrived and his breathing has come down to 24 and everything is normal; she says that the pt is adamant about not going to urgent care; previous nurse triage note reviewed and recommendaton made per previous nurse triage; [name] says that she will try to get the patient to go to urgent care; the pt is normally seen by Dr Brigitte Pulse at [place].

## 2018-09-07 NOTE — Telephone Encounter (Signed)
Pt daughter called to say her father's home health physical therapist called to say he is wheezing. Daughter reports BP 122/76 HR 86. O2 saturation 96-98%. Daughter is not with her father and gave permission for me to call and speak directly to the home health physical therapist. Call placed to [phone]. I spoke to [place] physical therapist.  She states that Mr Ellender is wheezing at rest and has a cough today that is nonproductive.  She checked his temp and it is 96.9 oral but the environment is cold. She states his O2 saturation has been as low as 93%. Pt has expiratory and inspiratory wheezes on ascultation. Per protocol pt will be seen at urgent care. No appointments found at office for today. Care advice read to daughter. Daughter verbalized understanding of instructions.  Reason for Disposition . [1] MILD difficulty breathing (e.g., minimal/no SOB at rest, SOB with walking, pulse <100) AND [2] NEW-onset or WORSE than normal  Answer Assessment - Initial Assessment Questions 1. RESPIRATORY STATUS: "Describe your breathing?" (e.g., wheezing, shortness of breath, unable to speak, severe coughing)      wheezing 2. ONSET: "When did this breathing problem begin?"      This morning has cough non productive 3. PATTERN "Does the difficult breathing come and go, or has it been constant since it started?"      constant 4. SEVERITY: "How bad is your breathing?" (e.g., mild, moderate, severe)    - MILD: No SOB at rest, mild SOB with walking, speaks normally in sentences, can lay down, no retractions, pulse < 100.    - MODERATE: SOB at rest, SOB with minimal exertion and prefers to sit, cannot lie down flat, speaks in phrases, mild retractions, audible wheezing, pulse 100-120.    - SEVERE: Very SOB at rest, speaks in single words, struggling to breathe, sitting hunched forward, retractions, pulse > 120      moderate 5. RECURRENT SYMPTOM: "Have you had difficulty breathing before?" If  so, ask: "When was the last time?" and "What happened that time?"      Allergy related 6. CARDIAC HISTORY: "Do you have any history of heart disease?" (e.g., heart attack, angina, bypass surgery, angioplasty)      No stroke in 2018 7. LUNG HISTORY: "Do you have any history of lung disease?"  (e.g., pulmonary embolus, asthma, emphysema)     Asthma as child 8. CAUSE: "What do you think is causing the breathing problem?"      Unknown has a cough 9. OTHER SYMPTOMS: "Do you have any other symptoms? (e.g., dizziness, runny nose, cough, chest pain, fever)     Cough feels tired 10. PREGNANCY: "Is there any chance you are pregnant?" "When was your last menstrual period?"       N/A 11. TRAVEL: "Have you traveled out of the country in the last month?" (e.g., travel history, exposures)       no  Protocols used: BREATHING DIFFICULTY-A-AH

## 2018-09-08 DIAGNOSIS — I10 Essential (primary) hypertension: Secondary | ICD-10-CM | POA: Diagnosis not present

## 2018-09-08 DIAGNOSIS — R2689 Other abnormalities of gait and mobility: Secondary | ICD-10-CM | POA: Diagnosis not present

## 2018-09-08 DIAGNOSIS — R296 Repeated falls: Secondary | ICD-10-CM | POA: Diagnosis not present

## 2018-09-08 DIAGNOSIS — Z8673 Personal history of transient ischemic attack (TIA), and cerebral infarction without residual deficits: Secondary | ICD-10-CM | POA: Diagnosis not present

## 2018-09-08 DIAGNOSIS — G5631 Lesion of radial nerve, right upper limb: Secondary | ICD-10-CM | POA: Diagnosis not present

## 2018-09-08 DIAGNOSIS — M6281 Muscle weakness (generalized): Secondary | ICD-10-CM | POA: Diagnosis not present

## 2018-09-09 ENCOUNTER — Telehealth: Payer: Self-pay | Admitting: Family Medicine

## 2018-09-09 DIAGNOSIS — R296 Repeated falls: Secondary | ICD-10-CM | POA: Diagnosis not present

## 2018-09-09 DIAGNOSIS — G5631 Lesion of radial nerve, right upper limb: Secondary | ICD-10-CM | POA: Diagnosis not present

## 2018-09-09 DIAGNOSIS — I10 Essential (primary) hypertension: Secondary | ICD-10-CM | POA: Diagnosis not present

## 2018-09-09 DIAGNOSIS — Z8673 Personal history of transient ischemic attack (TIA), and cerebral infarction without residual deficits: Secondary | ICD-10-CM | POA: Diagnosis not present

## 2018-09-09 DIAGNOSIS — R2689 Other abnormalities of gait and mobility: Secondary | ICD-10-CM | POA: Diagnosis not present

## 2018-09-09 DIAGNOSIS — M6281 Muscle weakness (generalized): Secondary | ICD-10-CM | POA: Diagnosis not present

## 2018-09-09 NOTE — Telephone Encounter (Signed)
Copied from [place] [id number]. Topic: Quick Communication - Home Health Verbal Orders >> Sep 09, 2018  9:34 AM Scherrie Gerlach wrote: Caller/Agency: Teodora Medici (PT) Callback Number: [phone] [place], [place], states pt went to [facility] on Monday and dx with PNA.  Betsy requesting verbal to add on skilled nursing Evaluation Dr Pamella Pert

## 2018-09-10 DIAGNOSIS — I10 Essential (primary) hypertension: Secondary | ICD-10-CM | POA: Diagnosis not present

## 2018-09-10 DIAGNOSIS — M6281 Muscle weakness (generalized): Secondary | ICD-10-CM | POA: Diagnosis not present

## 2018-09-10 DIAGNOSIS — Z8673 Personal history of transient ischemic attack (TIA), and cerebral infarction without residual deficits: Secondary | ICD-10-CM | POA: Diagnosis not present

## 2018-09-10 DIAGNOSIS — R296 Repeated falls: Secondary | ICD-10-CM | POA: Diagnosis not present

## 2018-09-10 DIAGNOSIS — R2689 Other abnormalities of gait and mobility: Secondary | ICD-10-CM | POA: Diagnosis not present

## 2018-09-10 DIAGNOSIS — G5631 Lesion of radial nerve, right upper limb: Secondary | ICD-10-CM | POA: Diagnosis not present

## 2018-09-10 NOTE — Telephone Encounter (Signed)
FYI

## 2018-09-11 ENCOUNTER — Other Ambulatory Visit: Payer: Self-pay

## 2018-09-11 ENCOUNTER — Ambulatory Visit (INDEPENDENT_AMBULATORY_CARE_PROVIDER_SITE_OTHER): Payer: Medicare Other | Admitting: Family Medicine

## 2018-09-11 ENCOUNTER — Encounter: Payer: Self-pay | Admitting: Family Medicine

## 2018-09-11 VITALS — BP 136/78 | HR 73 | Temp 98.2°F | Resp 14 | Ht 66.0 in | Wt 155.6 lb

## 2018-09-11 DIAGNOSIS — J181 Lobar pneumonia, unspecified organism: Secondary | ICD-10-CM | POA: Diagnosis not present

## 2018-09-11 DIAGNOSIS — J189 Pneumonia, unspecified organism: Secondary | ICD-10-CM

## 2018-09-11 NOTE — Progress Notes (Addendum)
11/15/201912:20 PM  [name] 1931-12-28, 84 y.o. male [id number]  Chief Complaint  Patient presents with  . Follow-up    was seen at Urgent Care dx with Pnuemonia, feelings better since been on rx and was told to follow to make sure lungs is clearing up    HPI:   Patient is a 84 y.o. male with past medical history significant for HTN, asthma, BPH, CKD, HLP, CVA  who presents today for follpowup on after urgent care visit for PNA   Went to [facility] 4 days ago Was given albuterol, dexamethasone, rocephin cxr done - showed opacity Given doxy outpatient Patient reports feeling much better, normal breathing Minimal wheezing, albuterol helps Cough much improved, sputum resolved Denies any fever  Otherwise he is doing home PT/OT for right radial nerve palsy He feels it is slowly recovering  Fall Risk  09/11/2018 07/27/2018 05/11/2018 03/11/2018 02/24/2018  Falls in the past year? 1 Yes Yes Yes Yes  Comment - - - - -  Number falls in past yr: 0 2 or more 1 2 or more 2 or more  Injury with Fall? 1 Yes No - No  Comment - - - - -  Risk Factor Category  - - High Fall Risk - -  Risk for fall due to : - - - - -  Follow up - - - - -     Depression screen [facility] 2/9 09/11/2018 07/27/2018 05/11/2018  Decreased Interest 0 0 0  Down, Depressed, Hopeless 0 0 0  PHQ - 2 Score 0 0 0  Altered sleeping 0 - -  Tired, decreased energy 0 - -  Change in appetite 0 - -  Feeling bad or failure about yourself  0 - -  Trouble concentrating 0 - -  Moving slowly or fidgety/restless 0 - -  Suicidal thoughts 0 - -  PHQ-9 Score 0 - -  Difficult doing work/chores Not difficult at all - -    Allergies  Allergen Reactions  . Seasonal Ic [Cholestatin]     Prior to Admission medications   Medication Sig Start Date End Date Taking? Authorizing Provider  alendronate (FOSAMAX) 70 MG tablet Take 1 tablet (70 mg total) by mouth every 7 (seven) days. Take with a full glass of water on an empty  stomach. 07/27/18  Yes Rutherford Guys, MD  aspirin EC 81 MG EC tablet Take 1 tablet (81 mg total) by mouth daily. 01/21/18  Yes Velvet Bathe, MD  clobetasol ointment (TEMOVATE) 2.45 % Apply 1 application topically 2 (two) times daily. 07/27/18  Yes Rutherford Guys, MD  fluocinonide-emollient (LIDEX-E) 0.05 % cream Apply 1 application topically 2 (two) times daily. 07/27/18  Yes Rutherford Guys, MD  metoprolol succinate (TOPROL-XL) 50 MG 24 hr tablet Take 1 tablet (50 mg total) by mouth daily. Take with or immediately following a meal. 07/27/18  Yes Rutherford Guys, MD  Misc. Devices [facility]) MISC Use walker whenever walking, standard walker 07/27/18  Yes Rutherford Guys, MD  pravastatin (PRAVACHOL) 40 MG tablet Take 1 tablet (40 mg total) by mouth daily. 07/27/18  Yes Rutherford Guys, MD  predniSONE (DELTASONE) 20 MG tablet Take 2 tablets (40 mg total) by mouth daily. 07/27/18  Yes Rutherford Guys, MD  Skin Protectants, Misc. (EUCERIN) cream Apply 1 application topically as needed for dry skin. 02/18/18  Yes Wardell Honour, MD    Past Medical History:  Diagnosis Date  . Allergy  seasonal  fall  . Cancer ([place])    Basal cell carcinoma; multiple  . Eczema   . Hyperlipidemia   . Hypertension   . Osteoporosis   . Stroke [facility])     Past Surgical History:  Procedure Laterality Date  . CATARACT EXTRACTION, BILATERAL  06/29/2015  . INTRAMEDULLARY (IM) NAIL INTERTROCHANTERIC Left 03/30/2017   Procedure: INTRAMEDULLARY (IM) NAIL INTERTROCHANTRIC;  Surgeon: [name], MD;  Location: [place];  Service: Orthopedics;  Laterality: Left;  . MOHS SURGERY    . TONSILLECTOMY  1940 maybe    Social History   Tobacco Use  . Smoking status: Never Smoker  . Smokeless tobacco: Never Used  Substance Use Topics  . Alcohol use: No    Comment: occas    Family History  Problem Relation Age of Onset  . Arthritis Mother   . Heart disease Mother 55  . Hypertension Father     ROS Per  hpi  OBJECTIVE:  Blood pressure 136/78, pulse 73, temperature 98.2 F (36.8 C), temperature source Oral, resp. rate 14, height 5\' 6"  (1.676 m), weight 155 lb 9.6 oz (70.6 kg), SpO2 96 %. Body mass index is 25.11 kg/m.   Physical Exam  Constitutional: He is oriented to person, place, and time. He appears well-developed and well-nourished.  HENT:  Head: Normocephalic and atraumatic.  Mouth/Throat: Oropharynx is clear and moist.  Eyes: Pupils are equal, round, and reactive to light. Conjunctivae and EOM are normal.  Neck: Neck supple.  Cardiovascular: Normal rate and regular rhythm. Exam reveals no gallop and no friction rub.  No murmur heard. Pulmonary/Chest: Effort normal and breath sounds normal. He has no wheezes. He has no rales.  Musculoskeletal: He exhibits no edema.  Neurological: He is alert and oriented to person, place, and time.  Skin: Skin is warm and dry.  Psychiatric: He has a normal mood and affect.  Nursing note and vitals reviewed.   ASSESSMENT and PLAN  1. Community acquired pneumonia of right lower lobe of lung ([place]) Resolving, symptomatically much better. Complete abx course. Repeat cxr at next visit. RTC precautions given  Return in about 4 weeks (around 10/09/2018) for repeat CXR.    Rutherford Guys, MD Primary Care at [place], [place] [address] Ph.  [phone] Fax [phone]

## 2018-09-11 NOTE — Patient Instructions (Signed)
° ° ° °  If you have lab work done today you will be contacted with your lab results within the next 2 weeks.  If you have not heard from us then please contact us. The fastest way to get your results is to register for My Chart. ° ° °IF you received an x-ray today, you will receive an invoice from [facility]. Please contact [facility] at [phone] with questions or concerns regarding your invoice.  ° °IF you received labwork today, you will receive an invoice from [facility]. Please contact [facility] at [phone] with questions or concerns regarding your invoice.  ° °Our billing staff will not be able to assist you with questions regarding bills from these companies. ° °You will be contacted with the lab results as soon as they are available. The fastest way to get your results is to activate your My Chart account. Instructions are located on the last page of this paperwork. If you have not heard from us regarding the results in 2 weeks, please contact this office. °  ° ° ° °

## 2018-09-13 NOTE — Telephone Encounter (Signed)
Called [facility] with order for skilled nursing eval for pt.

## 2018-09-14 DIAGNOSIS — R296 Repeated falls: Secondary | ICD-10-CM | POA: Diagnosis not present

## 2018-09-14 DIAGNOSIS — M6281 Muscle weakness (generalized): Secondary | ICD-10-CM | POA: Diagnosis not present

## 2018-09-14 DIAGNOSIS — G5631 Lesion of radial nerve, right upper limb: Secondary | ICD-10-CM | POA: Diagnosis not present

## 2018-09-14 DIAGNOSIS — R2689 Other abnormalities of gait and mobility: Secondary | ICD-10-CM | POA: Diagnosis not present

## 2018-09-14 DIAGNOSIS — I10 Essential (primary) hypertension: Secondary | ICD-10-CM | POA: Diagnosis not present

## 2018-09-14 DIAGNOSIS — Z8673 Personal history of transient ischemic attack (TIA), and cerebral infarction without residual deficits: Secondary | ICD-10-CM | POA: Diagnosis not present

## 2018-09-15 DIAGNOSIS — R296 Repeated falls: Secondary | ICD-10-CM | POA: Diagnosis not present

## 2018-09-15 DIAGNOSIS — M6281 Muscle weakness (generalized): Secondary | ICD-10-CM | POA: Diagnosis not present

## 2018-09-15 DIAGNOSIS — G5631 Lesion of radial nerve, right upper limb: Secondary | ICD-10-CM | POA: Diagnosis not present

## 2018-09-15 DIAGNOSIS — R2689 Other abnormalities of gait and mobility: Secondary | ICD-10-CM | POA: Diagnosis not present

## 2018-09-15 DIAGNOSIS — Z8673 Personal history of transient ischemic attack (TIA), and cerebral infarction without residual deficits: Secondary | ICD-10-CM | POA: Diagnosis not present

## 2018-09-15 DIAGNOSIS — I10 Essential (primary) hypertension: Secondary | ICD-10-CM | POA: Diagnosis not present

## 2018-09-16 DIAGNOSIS — Z8673 Personal history of transient ischemic attack (TIA), and cerebral infarction without residual deficits: Secondary | ICD-10-CM | POA: Diagnosis not present

## 2018-09-16 DIAGNOSIS — R296 Repeated falls: Secondary | ICD-10-CM | POA: Diagnosis not present

## 2018-09-16 DIAGNOSIS — I10 Essential (primary) hypertension: Secondary | ICD-10-CM | POA: Diagnosis not present

## 2018-09-16 DIAGNOSIS — G5631 Lesion of radial nerve, right upper limb: Secondary | ICD-10-CM | POA: Diagnosis not present

## 2018-09-16 DIAGNOSIS — M6281 Muscle weakness (generalized): Secondary | ICD-10-CM | POA: Diagnosis not present

## 2018-09-16 DIAGNOSIS — R2689 Other abnormalities of gait and mobility: Secondary | ICD-10-CM | POA: Diagnosis not present

## 2018-09-17 DIAGNOSIS — R296 Repeated falls: Secondary | ICD-10-CM | POA: Diagnosis not present

## 2018-09-17 DIAGNOSIS — I10 Essential (primary) hypertension: Secondary | ICD-10-CM | POA: Diagnosis not present

## 2018-09-17 DIAGNOSIS — Z8673 Personal history of transient ischemic attack (TIA), and cerebral infarction without residual deficits: Secondary | ICD-10-CM | POA: Diagnosis not present

## 2018-09-17 DIAGNOSIS — M6281 Muscle weakness (generalized): Secondary | ICD-10-CM | POA: Diagnosis not present

## 2018-09-17 DIAGNOSIS — R2689 Other abnormalities of gait and mobility: Secondary | ICD-10-CM | POA: Diagnosis not present

## 2018-09-17 DIAGNOSIS — G5631 Lesion of radial nerve, right upper limb: Secondary | ICD-10-CM | POA: Diagnosis not present

## 2018-09-17 NOTE — Telephone Encounter (Signed)
noted 

## 2018-09-21 DIAGNOSIS — Z8673 Personal history of transient ischemic attack (TIA), and cerebral infarction without residual deficits: Secondary | ICD-10-CM | POA: Diagnosis not present

## 2018-09-21 DIAGNOSIS — R296 Repeated falls: Secondary | ICD-10-CM | POA: Diagnosis not present

## 2018-09-21 DIAGNOSIS — I10 Essential (primary) hypertension: Secondary | ICD-10-CM | POA: Diagnosis not present

## 2018-09-21 DIAGNOSIS — M6281 Muscle weakness (generalized): Secondary | ICD-10-CM | POA: Diagnosis not present

## 2018-09-21 DIAGNOSIS — G5631 Lesion of radial nerve, right upper limb: Secondary | ICD-10-CM | POA: Diagnosis not present

## 2018-09-21 DIAGNOSIS — R2689 Other abnormalities of gait and mobility: Secondary | ICD-10-CM | POA: Diagnosis not present

## 2018-09-22 DIAGNOSIS — R296 Repeated falls: Secondary | ICD-10-CM | POA: Diagnosis not present

## 2018-09-22 DIAGNOSIS — G5631 Lesion of radial nerve, right upper limb: Secondary | ICD-10-CM | POA: Diagnosis not present

## 2018-09-22 DIAGNOSIS — M6281 Muscle weakness (generalized): Secondary | ICD-10-CM | POA: Diagnosis not present

## 2018-09-22 DIAGNOSIS — Z8673 Personal history of transient ischemic attack (TIA), and cerebral infarction without residual deficits: Secondary | ICD-10-CM | POA: Diagnosis not present

## 2018-09-22 DIAGNOSIS — R2689 Other abnormalities of gait and mobility: Secondary | ICD-10-CM | POA: Diagnosis not present

## 2018-09-22 DIAGNOSIS — I10 Essential (primary) hypertension: Secondary | ICD-10-CM | POA: Diagnosis not present

## 2018-09-23 ENCOUNTER — Telehealth: Payer: Self-pay | Admitting: Family Medicine

## 2018-09-23 NOTE — Telephone Encounter (Signed)
Called and spoke with pt's daughter regarding pt's appt on 11/12/18. Due to provider schedule changes, we needed to get pt rescheduled. I was able to reschedule with [name] on 11/12/18 at 9:00. I advised of time, building and late policy. Pt's daughter acknowledged.

## 2018-09-25 DIAGNOSIS — R296 Repeated falls: Secondary | ICD-10-CM | POA: Diagnosis not present

## 2018-09-25 DIAGNOSIS — Z8673 Personal history of transient ischemic attack (TIA), and cerebral infarction without residual deficits: Secondary | ICD-10-CM | POA: Diagnosis not present

## 2018-09-25 DIAGNOSIS — M6281 Muscle weakness (generalized): Secondary | ICD-10-CM | POA: Diagnosis not present

## 2018-09-25 DIAGNOSIS — R2689 Other abnormalities of gait and mobility: Secondary | ICD-10-CM | POA: Diagnosis not present

## 2018-09-25 DIAGNOSIS — I10 Essential (primary) hypertension: Secondary | ICD-10-CM | POA: Diagnosis not present

## 2018-09-25 DIAGNOSIS — G5631 Lesion of radial nerve, right upper limb: Secondary | ICD-10-CM | POA: Diagnosis not present

## 2018-09-28 ENCOUNTER — Telehealth: Payer: Self-pay | Admitting: Family Medicine

## 2018-09-28 DIAGNOSIS — I10 Essential (primary) hypertension: Secondary | ICD-10-CM | POA: Diagnosis not present

## 2018-09-28 DIAGNOSIS — R2689 Other abnormalities of gait and mobility: Secondary | ICD-10-CM | POA: Diagnosis not present

## 2018-09-28 DIAGNOSIS — R296 Repeated falls: Secondary | ICD-10-CM | POA: Diagnosis not present

## 2018-09-28 DIAGNOSIS — M6281 Muscle weakness (generalized): Secondary | ICD-10-CM | POA: Diagnosis not present

## 2018-09-28 DIAGNOSIS — Z8673 Personal history of transient ischemic attack (TIA), and cerebral infarction without residual deficits: Secondary | ICD-10-CM | POA: Diagnosis not present

## 2018-09-28 DIAGNOSIS — G5631 Lesion of radial nerve, right upper limb: Secondary | ICD-10-CM | POA: Diagnosis not present

## 2018-09-28 NOTE — Telephone Encounter (Signed)
Copied from [place] [id number]. Topic: Quick Communication - Home Health Verbal Orders >> Sep 28, 2018  9:37 AM Alanda Slim E wrote: Caller/Agency: Therisa Doyne - [facility]  Callback Number: [phone] Requesting PT extension  Frequency: 2x's a week for 3 weeks

## 2018-09-28 NOTE — Telephone Encounter (Signed)
Left message ok'd verbal order for pt extenstion

## 2018-09-29 ENCOUNTER — Ambulatory Visit (INDEPENDENT_AMBULATORY_CARE_PROVIDER_SITE_OTHER): Payer: Medicare Other

## 2018-09-29 ENCOUNTER — Ambulatory Visit (INDEPENDENT_AMBULATORY_CARE_PROVIDER_SITE_OTHER): Payer: Medicare Other | Admitting: Family Medicine

## 2018-09-29 ENCOUNTER — Encounter: Payer: Self-pay | Admitting: Family Medicine

## 2018-09-29 ENCOUNTER — Other Ambulatory Visit: Payer: Self-pay

## 2018-09-29 VITALS — BP 134/74 | HR 81 | Temp 97.7°F | Resp 16 | Ht 66.0 in | Wt 152.6 lb

## 2018-09-29 DIAGNOSIS — J189 Pneumonia, unspecified organism: Secondary | ICD-10-CM

## 2018-09-29 DIAGNOSIS — I10 Essential (primary) hypertension: Secondary | ICD-10-CM

## 2018-09-29 DIAGNOSIS — I6930 Unspecified sequelae of cerebral infarction: Secondary | ICD-10-CM

## 2018-09-29 DIAGNOSIS — R0989 Other specified symptoms and signs involving the circulatory and respiratory systems: Secondary | ICD-10-CM | POA: Diagnosis not present

## 2018-09-29 DIAGNOSIS — J181 Lobar pneumonia, unspecified organism: Secondary | ICD-10-CM

## 2018-09-29 DIAGNOSIS — R05 Cough: Secondary | ICD-10-CM | POA: Diagnosis not present

## 2018-09-29 MED ORDER — ALBUTEROL SULFATE HFA 108 (90 BASE) MCG/ACT IN AERS: 2.0000 | INHALATION_SPRAY | Freq: Four times a day (QID) | RESPIRATORY_TRACT | 0 refills | Status: DC | PRN

## 2018-09-29 NOTE — Assessment & Plan Note (Signed)
BP stable, continue current doses

## 2018-09-29 NOTE — Progress Notes (Addendum)
Established Patient Office Visit  Subjective:  Patient ID: [name], male    DOB: Aug 25, 1932  Age: 84 y.o. MRN: [id number]  CC:  Chief Complaint  Patient presents with  . wheezing and crackling in lungs per home health nurse and co    pt just finished antibiotic regimen for pneumonia per daughter.    HPI [name] presents for crackles in the lung heard by his PT and home health Patient is here with his daughter who thought she heard wheezing The patient reports that he feels well.  He went to [facility] and had CXR around 09/07/18 and was diagnosed with CAP of the R lower lobe States that he completed the abx.  He uses his inhaler for wheezing 1-2 times a day   Essential Hypertension Pt has history of hypertension No hypotension noted No palpitations Currently takes his meds daily BP Readings from Last 3 Encounters:  09/29/18 134/74  09/11/18 136/78  07/27/18 140/78     Past Medical History:  Diagnosis Date  . Allergy    seasonal  fall  . Cancer ([place])    Basal cell carcinoma; multiple  . Eczema   . Hyperlipidemia   . Hypertension   . Osteoporosis   . Stroke [facility])     Past Surgical History:  Procedure Laterality Date  . CATARACT EXTRACTION, BILATERAL  06/29/2015  . INTRAMEDULLARY (IM) NAIL INTERTROCHANTERIC Left 03/30/2017   Procedure: INTRAMEDULLARY (IM) NAIL INTERTROCHANTRIC;  Surgeon: [name], MD;  Location: [place];  Service: Orthopedics;  Laterality: Left;  . MOHS SURGERY    . TONSILLECTOMY  1940 maybe    Family History  Problem Relation Age of Onset  . Arthritis Mother   . Heart disease Mother 9  . Hypertension Father     Social History   Socioeconomic History  . Marital status: Married    Spouse name: Not on file  . Number of children: 2  . Years of education: Not on file  . Highest education level: Not on file  Occupational History  . Occupation: retired  Scientific laboratory technician  . Financial resource strain: Not on file  . Food insecurity:   Worry: Not on file    Inability: Not on file  . Transportation needs:    Medical: Not on file    Non-medical: Not on file  Tobacco Use  . Smoking status: Never Smoker  . Smokeless tobacco: Never Used  Substance and Sexual Activity  . Alcohol use: No    Comment: occas  . Drug use: No  . Sexual activity: Not Currently  Lifestyle  . Physical activity:    Days per week: Not on file    Minutes per session: Not on file  . Stress: Not on file  Relationships  . Social connections:    Talks on phone: Not on file    Gets together: Not on file    Attends religious service: Not on file    Active member of club or organization: Not on file    Attends meetings of clubs or organizations: Not on file    Relationship status: Not on file  . Intimate partner violence:    Fear of current or ex partner: Not on file    Emotionally abused: Not on file    Physically abused: Not on file    Forced sexual activity: Not on file  Other Topics Concern  . Not on file  Social History Narrative   Marital status: divorced; good friend with ex-wife;  not dating      Children: 2 children; 4 grandchildren; no gg      Lives:  Alone in house; daughter six blocks away      Employment: college professor; retired in 1997; history; Engineer, manufacturing      Tobacco:  Never      Alcohol: tequila loves but won't buy it.      Exercise:  As much as can; previous competitive biking.      ADLs: quit mowing grass in 2017; no assistant devices; cooks and cleans and pays bills.  No driving; HAS NEVER DRIVEN; has always ridden bike.  Daughter takes patient to grocery store and to appointments.  Daughter drives patient to grocery store.      Advanced Directives:  No living; DNR; do not resuscitate.  HCPOA: [name] Cummings    Outpatient Medications Prior to Visit  Medication Sig Dispense Refill  . alendronate (FOSAMAX) 70 MG tablet Take 1 tablet (70 mg total) by mouth every 7 (seven) days. Take with a full glass of water on an  empty stomach. 4 tablet 11  . aspirin EC 81 MG EC tablet Take 1 tablet (81 mg total) by mouth daily. 30 tablet 0  . clobetasol ointment (TEMOVATE) 1.44 % Apply 1 application topically 2 (two) times daily. 60 g prn  . metoprolol succinate (TOPROL-XL) 50 MG 24 hr tablet Take 1 tablet (50 mg total) by mouth daily. Take with or immediately following a meal. 90 tablet 1  . Misc. Devices (WALKER) MISC Use walker whenever walking, standard walker 1 each 0  . pravastatin (PRAVACHOL) 40 MG tablet Take 1 tablet (40 mg total) by mouth daily. 90 tablet 1  . predniSONE (DELTASONE) 20 MG tablet Take 2 tablets (40 mg total) by mouth daily. 10 tablet 1  . Skin Protectants, Misc. (EUCERIN) cream Apply 1 application topically as needed for dry skin. 454 g 1  . fluocinonide-emollient (LIDEX-E) 0.05 % cream Apply 1 application topically 2 (two) times daily. (Patient not taking: Reported on 09/29/2018) 30 g 5   No facility-administered medications prior to visit.     Allergies  Allergen Reactions  . Seasonal Ic [Cholestatin]     ROS Review of Systems   see hpi Review of Systems  Constitutional: Negative for activity change, appetite change, chills and fever.  HENT: Negative for congestion, nosebleeds, trouble swallowing and voice change.   Respiratory: see hpi  Gastrointestinal: Negative for diarrhea, nausea and vomiting.    Objective:     BP 134/74 (BP Location: Left Arm, Patient Position: Sitting, Cuff Size: Normal)   Pulse 81   Temp 97.7 F (36.5 C) (Oral)   Resp 16   Ht 5\' 6"  (1.676 m)   Wt 152 lb 9.6 oz (69.2 kg)   SpO2 95%   BMI 24.63 kg/m   Wt Readings from Last 3 Encounters:  09/29/18 152 lb 9.6 oz (69.2 kg)  09/11/18 155 lb 9.6 oz (70.6 kg)  07/27/18 152 lb (68.9 kg)    Physical Exam  General: alert, oriented, in NAD Head: normocephalic, atraumatic, no sinus tenderness Eyes: EOM intact, no scleral icterus or conjunctival injection Ears: TM clear bilaterally Nose: mucosa  nonerythematous, nonedematous Throat: no pharyngeal exudate or erythema Lymph: no posterior auricular, submental or cervical lymph adenopathy Heart: normal rate, normal sinus rhythm, no murmurs Lungs: crackles heard in right lower lobe    There are no preventive care reminders to display for this patient.  There are no preventive care reminders to  display for this patient.  Lab Results  Component Value Date   TSH 1.349 06/03/2014   Lab Results  Component Value Date   WBC 13.8 (H) 07/04/2018   HGB 14.3 07/04/2018   HCT 42.0 07/04/2018   MCV 96.7 07/04/2018   PLT 162 07/04/2018   Lab Results  Component Value Date   NA 139 07/04/2018   K 3.9 07/04/2018   CO2 25 07/04/2018   GLUCOSE 99 07/04/2018   BUN 20 07/04/2018   CREATININE 1.20 07/04/2018   BILITOT 1.2 07/04/2018   ALKPHOS 58 07/04/2018   AST 44 (H) 07/04/2018   ALT 16 07/04/2018   PROT 6.2 (L) 07/04/2018   ALBUMIN 3.6 07/04/2018   CALCIUM 9.0 07/04/2018   ANIONGAP 11 07/04/2018   Lab Results  Component Value Date   CHOL 161 03/11/2018   Lab Results  Component Value Date   HDL 64 03/11/2018   Lab Results  Component Value Date   LDLCALC 79 03/11/2018   Lab Results  Component Value Date   TRIG 92 03/11/2018   Lab Results  Component Value Date   CHOLHDL 2.5 03/11/2018   Lab Results  Component Value Date   HGBA1C 5.0 01/17/2018      Assessment & Plan:   Problem List Items Addressed This Visit      Cardiovascular and Mediastinum   Benign essential HTN    BP stable, continue current doses        Respiratory   Community acquired pneumonia of right lower lobe of lung ([place]) - Primary    patient recovering well, no respiratory distress, provided reassurance. Since the xray from UC was not available for comparison will plan to repeat xray in January 2020 His previous pneumonia was in the right lower lobe and he already completed treatment.  The interval for reimaging was short thus gave  precautions and advised to continue albuterol inhaler.  No new abx at this time.       Relevant Medications   albuterol (PROVENTIL HFA;VENTOLIN HFA) 108 (90 Base) MCG/ACT inhaler   Other Relevant Orders   DG Chest 2 View (Completed)    Other Visit Diagnoses    Lung crackles       no sign of fluid overload, advised that this takes a while to resolve. provided reassurance    Essential hypertension           Meds ordered this encounter  Medications  . albuterol (PROVENTIL HFA;VENTOLIN HFA) 108 (90 Base) MCG/ACT inhaler    Sig: Inhale 2 puffs into the lungs every 6 (six) hours as needed for wheezing or shortness of breath.    Dispense:  1 Inhaler    Refill:  0    Follow-up: Return if symptoms worsen or fail to improve.    Forrest Moron, MD

## 2018-09-29 NOTE — Assessment & Plan Note (Signed)
patient recovering well, no respiratory distress, provided reassurance. Since the xray from UC was not available for comparison will plan to repeat xray in January 2020 His previous pneumonia was in the right lower lobe and he already completed treatment.  The interval for reimaging was short thus gave precautions and advised to continue albuterol inhaler.  No new abx at this time.

## 2018-09-29 NOTE — Patient Instructions (Addendum)
Continue home health and physical therapy  Crackles are just a part of the evolution of his previously diagnosed pneumonia. He can continue albuterol as needed.     If you have lab work done today you will be contacted with your lab results within the next 2 weeks.  If you have not heard from [place] then please contact us. The fastest way to get your results is to register for My Chart.   IF you received an x-ray today, you will receive an invoice from [facility]. Please contact [facility] at [phone] with questions or concerns regarding your invoice.   IF you received labwork today, you will receive an invoice from [facility]. Please contact [facility] at [phone] with questions or concerns regarding your invoice.   Our billing staff will not be able to assist you with questions regarding bills from these companies.  You will be contacted with the lab results as soon as they are available. The fastest way to get your results is to activate your My Chart account. Instructions are located on the last page of this paperwork. If you have not heard from [place] regarding the results in 2 weeks, please contact this office.

## 2018-09-30 DIAGNOSIS — G5631 Lesion of radial nerve, right upper limb: Secondary | ICD-10-CM | POA: Diagnosis not present

## 2018-09-30 DIAGNOSIS — R296 Repeated falls: Secondary | ICD-10-CM | POA: Diagnosis not present

## 2018-09-30 DIAGNOSIS — Z8673 Personal history of transient ischemic attack (TIA), and cerebral infarction without residual deficits: Secondary | ICD-10-CM | POA: Diagnosis not present

## 2018-09-30 DIAGNOSIS — I10 Essential (primary) hypertension: Secondary | ICD-10-CM | POA: Diagnosis not present

## 2018-09-30 DIAGNOSIS — R2689 Other abnormalities of gait and mobility: Secondary | ICD-10-CM | POA: Diagnosis not present

## 2018-09-30 DIAGNOSIS — M6281 Muscle weakness (generalized): Secondary | ICD-10-CM | POA: Diagnosis not present

## 2018-10-05 DIAGNOSIS — R296 Repeated falls: Secondary | ICD-10-CM | POA: Diagnosis not present

## 2018-10-05 DIAGNOSIS — G5631 Lesion of radial nerve, right upper limb: Secondary | ICD-10-CM | POA: Diagnosis not present

## 2018-10-05 DIAGNOSIS — M6281 Muscle weakness (generalized): Secondary | ICD-10-CM | POA: Diagnosis not present

## 2018-10-05 DIAGNOSIS — Z8673 Personal history of transient ischemic attack (TIA), and cerebral infarction without residual deficits: Secondary | ICD-10-CM | POA: Diagnosis not present

## 2018-10-05 DIAGNOSIS — I10 Essential (primary) hypertension: Secondary | ICD-10-CM | POA: Diagnosis not present

## 2018-10-05 DIAGNOSIS — R2689 Other abnormalities of gait and mobility: Secondary | ICD-10-CM | POA: Diagnosis not present

## 2018-10-07 DIAGNOSIS — R296 Repeated falls: Secondary | ICD-10-CM | POA: Diagnosis not present

## 2018-10-07 DIAGNOSIS — G5631 Lesion of radial nerve, right upper limb: Secondary | ICD-10-CM | POA: Diagnosis not present

## 2018-10-07 DIAGNOSIS — M6281 Muscle weakness (generalized): Secondary | ICD-10-CM | POA: Diagnosis not present

## 2018-10-07 DIAGNOSIS — R2689 Other abnormalities of gait and mobility: Secondary | ICD-10-CM | POA: Diagnosis not present

## 2018-10-07 DIAGNOSIS — I10 Essential (primary) hypertension: Secondary | ICD-10-CM | POA: Diagnosis not present

## 2018-10-07 DIAGNOSIS — Z8673 Personal history of transient ischemic attack (TIA), and cerebral infarction without residual deficits: Secondary | ICD-10-CM | POA: Diagnosis not present

## 2018-10-12 DIAGNOSIS — M6281 Muscle weakness (generalized): Secondary | ICD-10-CM | POA: Diagnosis not present

## 2018-10-12 DIAGNOSIS — R2689 Other abnormalities of gait and mobility: Secondary | ICD-10-CM | POA: Diagnosis not present

## 2018-10-12 DIAGNOSIS — G5631 Lesion of radial nerve, right upper limb: Secondary | ICD-10-CM | POA: Diagnosis not present

## 2018-10-12 DIAGNOSIS — I10 Essential (primary) hypertension: Secondary | ICD-10-CM | POA: Diagnosis not present

## 2018-10-12 DIAGNOSIS — R296 Repeated falls: Secondary | ICD-10-CM | POA: Diagnosis not present

## 2018-10-12 DIAGNOSIS — Z8673 Personal history of transient ischemic attack (TIA), and cerebral infarction without residual deficits: Secondary | ICD-10-CM | POA: Diagnosis not present

## 2018-10-14 DIAGNOSIS — R2689 Other abnormalities of gait and mobility: Secondary | ICD-10-CM | POA: Diagnosis not present

## 2018-10-14 DIAGNOSIS — M6281 Muscle weakness (generalized): Secondary | ICD-10-CM | POA: Diagnosis not present

## 2018-10-14 DIAGNOSIS — R296 Repeated falls: Secondary | ICD-10-CM | POA: Diagnosis not present

## 2018-10-14 DIAGNOSIS — G5631 Lesion of radial nerve, right upper limb: Secondary | ICD-10-CM | POA: Diagnosis not present

## 2018-10-14 DIAGNOSIS — I10 Essential (primary) hypertension: Secondary | ICD-10-CM | POA: Diagnosis not present

## 2018-10-14 DIAGNOSIS — Z8673 Personal history of transient ischemic attack (TIA), and cerebral infarction without residual deficits: Secondary | ICD-10-CM | POA: Diagnosis not present

## 2018-10-28 DIAGNOSIS — B9561 Methicillin susceptible Staphylococcus aureus infection as the cause of diseases classified elsewhere: Secondary | ICD-10-CM

## 2018-10-28 DIAGNOSIS — R7881 Bacteremia: Secondary | ICD-10-CM

## 2018-10-28 HISTORY — DX: Bacteremia: R78.81

## 2018-10-28 HISTORY — DX: Methicillin susceptible Staphylococcus aureus infection as the cause of diseases classified elsewhere: B95.61

## 2018-11-02 ENCOUNTER — Telehealth: Payer: Self-pay | Admitting: Family Medicine

## 2018-11-02 NOTE — Telephone Encounter (Signed)
Copied from [place] [id number]. Topic: Quick Communication - Rx Refill/Question >> Nov 02, 2018  2:28 PM Rayann Heman wrote: Medication:  Has the patient contacted their pharmacy?yes Preferred Pharmacy (with phone number or street name): [place], [place]. [phone] (Phone) [phone] (Fax)   Agent: Please be advised that RX refills may take up to 3 business days. We ask that you follow-up with your pharmacy.

## 2018-11-05 MED ORDER — TRIAMCINOLONE 0.1 % CREAM:EUCERIN CREAM 1:1: 1.0000 "application " | TOPICAL_CREAM | Freq: Two times a day (BID) | CUTANEOUS | 3 refills | Status: DC

## 2018-11-05 NOTE — Telephone Encounter (Signed)
done

## 2018-11-05 NOTE — Telephone Encounter (Signed)
pls see med rx

## 2018-11-05 NOTE — Telephone Encounter (Signed)
Call to [facility] about the cream that the family is trying to fill for patient. The Rx is a compounded Triamcinolone/Eucerin (2 parts/1 part) 450 grams.  It was filled by Dr Tamala Julian 01/29/18.   They would like to know if this can be refilled for use with the patient- please let them know- they have been requesting this for some time- but did not know how to request it.

## 2018-11-11 ENCOUNTER — Emergency Department (HOSPITAL_COMMUNITY): Payer: Medicare Other

## 2018-11-11 ENCOUNTER — Inpatient Hospital Stay (HOSPITAL_COMMUNITY)
Admission: EM | Admit: 2018-11-11 | Discharge: 2018-11-17 | DRG: 480 | Disposition: A | Discharge status: Skilled Nursing Facility | Payer: Medicare Other | Attending: Internal Medicine | Admitting: Internal Medicine

## 2018-11-11 ENCOUNTER — Other Ambulatory Visit: Payer: Self-pay

## 2018-11-11 ENCOUNTER — Encounter (HOSPITAL_COMMUNITY): Payer: Self-pay

## 2018-11-11 DIAGNOSIS — Z8249 Family history of ischemic heart disease and other diseases of the circulatory system: Secondary | ICD-10-CM | POA: Diagnosis not present

## 2018-11-11 DIAGNOSIS — I10 Essential (primary) hypertension: Secondary | ICD-10-CM

## 2018-11-11 DIAGNOSIS — M25551 Pain in right hip: Secondary | ICD-10-CM | POA: Diagnosis not present

## 2018-11-11 DIAGNOSIS — I4581 Long QT syndrome: Secondary | ICD-10-CM | POA: Diagnosis present

## 2018-11-11 DIAGNOSIS — Z8781 Personal history of (healed) traumatic fracture: Secondary | ICD-10-CM

## 2018-11-11 DIAGNOSIS — S72141A Displaced intertrochanteric fracture of right femur, initial encounter for closed fracture: Principal | ICD-10-CM | POA: Diagnosis present

## 2018-11-11 DIAGNOSIS — Z8261 Family history of arthritis: Secondary | ICD-10-CM

## 2018-11-11 DIAGNOSIS — I493 Ventricular premature depolarization: Secondary | ICD-10-CM | POA: Diagnosis present

## 2018-11-11 DIAGNOSIS — S72001A Fracture of unspecified part of neck of right femur, initial encounter for closed fracture: Secondary | ICD-10-CM | POA: Diagnosis not present

## 2018-11-11 DIAGNOSIS — Y92002 Bathroom of unspecified non-institutional (private) residence single-family (private) house as the place of occurrence of the external cause: Secondary | ICD-10-CM | POA: Diagnosis not present

## 2018-11-11 DIAGNOSIS — S72009A Fracture of unspecified part of neck of unspecified femur, initial encounter for closed fracture: Secondary | ICD-10-CM | POA: Diagnosis present

## 2018-11-11 DIAGNOSIS — I129 Hypertensive chronic kidney disease with stage 1 through stage 4 chronic kidney disease, or unspecified chronic kidney disease: Secondary | ICD-10-CM | POA: Diagnosis present

## 2018-11-11 DIAGNOSIS — G9341 Metabolic encephalopathy: Secondary | ICD-10-CM | POA: Diagnosis not present

## 2018-11-11 DIAGNOSIS — S7291XA Unspecified fracture of right femur, initial encounter for closed fracture: Secondary | ICD-10-CM

## 2018-11-11 DIAGNOSIS — M810 Age-related osteoporosis without current pathological fracture: Secondary | ICD-10-CM | POA: Diagnosis not present

## 2018-11-11 DIAGNOSIS — Z79899 Other long term (current) drug therapy: Secondary | ICD-10-CM

## 2018-11-11 DIAGNOSIS — M6281 Muscle weakness (generalized): Secondary | ICD-10-CM | POA: Diagnosis not present

## 2018-11-11 DIAGNOSIS — N179 Acute kidney failure, unspecified: Secondary | ICD-10-CM | POA: Diagnosis not present

## 2018-11-11 DIAGNOSIS — W01198A Fall on same level from slipping, tripping and stumbling with subsequent striking against other object, initial encounter: Secondary | ICD-10-CM | POA: Diagnosis not present

## 2018-11-11 DIAGNOSIS — M7989 Other specified soft tissue disorders: Secondary | ICD-10-CM | POA: Diagnosis not present

## 2018-11-11 DIAGNOSIS — E785 Hyperlipidemia, unspecified: Secondary | ICD-10-CM

## 2018-11-11 DIAGNOSIS — Z9841 Cataract extraction status, right eye: Secondary | ICD-10-CM

## 2018-11-11 DIAGNOSIS — Z7982 Long term (current) use of aspirin: Secondary | ICD-10-CM | POA: Diagnosis not present

## 2018-11-11 DIAGNOSIS — Z8673 Personal history of transient ischemic attack (TIA), and cerebral infarction without residual deficits: Secondary | ICD-10-CM | POA: Diagnosis not present

## 2018-11-11 DIAGNOSIS — Z66 Do not resuscitate: Secondary | ICD-10-CM | POA: Diagnosis present

## 2018-11-11 DIAGNOSIS — Z7952 Long term (current) use of systemic steroids: Secondary | ICD-10-CM

## 2018-11-11 DIAGNOSIS — Z9842 Cataract extraction status, left eye: Secondary | ICD-10-CM | POA: Diagnosis not present

## 2018-11-11 DIAGNOSIS — Z85828 Personal history of other malignant neoplasm of skin: Secondary | ICD-10-CM

## 2018-11-11 DIAGNOSIS — R1312 Dysphagia, oropharyngeal phase: Secondary | ICD-10-CM | POA: Diagnosis not present

## 2018-11-11 DIAGNOSIS — L309 Dermatitis, unspecified: Secondary | ICD-10-CM | POA: Diagnosis present

## 2018-11-11 DIAGNOSIS — M2550 Pain in unspecified joint: Secondary | ICD-10-CM | POA: Diagnosis not present

## 2018-11-11 DIAGNOSIS — R609 Edema, unspecified: Secondary | ICD-10-CM | POA: Diagnosis not present

## 2018-11-11 DIAGNOSIS — Z7983 Long term (current) use of bisphosphonates: Secondary | ICD-10-CM

## 2018-11-11 DIAGNOSIS — Z888 Allergy status to other drugs, medicaments and biological substances status: Secondary | ICD-10-CM

## 2018-11-11 DIAGNOSIS — Z9181 History of falling: Secondary | ICD-10-CM

## 2018-11-11 DIAGNOSIS — J302 Other seasonal allergic rhinitis: Secondary | ICD-10-CM | POA: Diagnosis present

## 2018-11-11 DIAGNOSIS — Z9089 Acquired absence of other organs: Secondary | ICD-10-CM

## 2018-11-11 DIAGNOSIS — W19XXXA Unspecified fall, initial encounter: Secondary | ICD-10-CM | POA: Diagnosis not present

## 2018-11-11 DIAGNOSIS — D62 Acute posthemorrhagic anemia: Secondary | ICD-10-CM | POA: Diagnosis not present

## 2018-11-11 DIAGNOSIS — Z7401 Bed confinement status: Secondary | ICD-10-CM | POA: Diagnosis not present

## 2018-11-11 DIAGNOSIS — S79911A Unspecified injury of right hip, initial encounter: Secondary | ICD-10-CM | POA: Diagnosis not present

## 2018-11-11 DIAGNOSIS — N182 Chronic kidney disease, stage 2 (mild): Secondary | ICD-10-CM | POA: Diagnosis present

## 2018-11-11 DIAGNOSIS — R52 Pain, unspecified: Secondary | ICD-10-CM | POA: Diagnosis not present

## 2018-11-11 DIAGNOSIS — R2681 Unsteadiness on feet: Secondary | ICD-10-CM | POA: Diagnosis not present

## 2018-11-11 DIAGNOSIS — J4520 Mild intermittent asthma, uncomplicated: Secondary | ICD-10-CM | POA: Diagnosis present

## 2018-11-11 DIAGNOSIS — E440 Moderate protein-calorie malnutrition: Secondary | ICD-10-CM | POA: Diagnosis not present

## 2018-11-11 DIAGNOSIS — G9340 Encephalopathy, unspecified: Secondary | ICD-10-CM | POA: Diagnosis not present

## 2018-11-11 DIAGNOSIS — S8991XA Unspecified injury of right lower leg, initial encounter: Secondary | ICD-10-CM | POA: Diagnosis not present

## 2018-11-11 DIAGNOSIS — R0902 Hypoxemia: Secondary | ICD-10-CM | POA: Diagnosis not present

## 2018-11-11 DIAGNOSIS — S72141D Displaced intertrochanteric fracture of right femur, subsequent encounter for closed fracture with routine healing: Secondary | ICD-10-CM | POA: Diagnosis not present

## 2018-11-11 DIAGNOSIS — R2689 Other abnormalities of gait and mobility: Secondary | ICD-10-CM | POA: Diagnosis not present

## 2018-11-11 LAB — CBC
HCT: 42.6 % (ref 39.0–52.0)
HEMOGLOBIN: 14.3 g/dL (ref 13.0–17.0)
MCH: 31.4 pg (ref 26.0–34.0)
MCHC: 33.6 g/dL (ref 30.0–36.0)
MCV: 93.6 fL (ref 80.0–100.0)
Platelets: 185 10*3/uL (ref 150–400)
RBC: 4.55 MIL/uL (ref 4.22–5.81)
RDW: 11.9 % (ref 11.5–15.5)
WBC: 12.8 10*3/uL — AB (ref 4.0–10.5)
nRBC: 0 % (ref 0.0–0.2)

## 2018-11-11 LAB — CBC WITH DIFFERENTIAL/PLATELET
ABS IMMATURE GRANULOCYTES: 0.11 10*3/uL — AB (ref 0.00–0.07)
BASOS PCT: 0 %
Basophils Absolute: 0.1 10*3/uL (ref 0.0–0.1)
Eosinophils Absolute: 0.1 10*3/uL (ref 0.0–0.5)
Eosinophils Relative: 1 %
HCT: 46 % (ref 39.0–52.0)
Hemoglobin: 15 g/dL (ref 13.0–17.0)
IMMATURE GRANULOCYTES: 1 %
Lymphocytes Relative: 7 %
Lymphs Abs: 1.1 10*3/uL (ref 0.7–4.0)
MCH: 32.2 pg (ref 26.0–34.0)
MCHC: 32.6 g/dL (ref 30.0–36.0)
MCV: 98.7 fL (ref 80.0–100.0)
Monocytes Absolute: 1.1 10*3/uL — ABNORMAL HIGH (ref 0.1–1.0)
Monocytes Relative: 7 %
NRBC: 0 % (ref 0.0–0.2)
Neutro Abs: 14 10*3/uL — ABNORMAL HIGH (ref 1.7–7.7)
Neutrophils Relative %: 84 %
Platelets: 198 10*3/uL (ref 150–400)
RBC: 4.66 MIL/uL (ref 4.22–5.81)
RDW: 12.2 % (ref 11.5–15.5)
WBC: 16.5 10*3/uL — ABNORMAL HIGH (ref 4.0–10.5)

## 2018-11-11 LAB — TYPE AND SCREEN
ABO/RH(D): O POS
ABO/RH(D): O POS
ANTIBODY SCREEN: NEGATIVE
Antibody Screen: NEGATIVE

## 2018-11-11 LAB — CREATININE, SERUM
Creatinine, Ser: 1.14 mg/dL (ref 0.61–1.24)
GFR calc Af Amer: >60 mL/min (ref >60)
GFR calc non Af Amer: 59 mL/min — ABNORMAL LOW (ref >60)

## 2018-11-11 LAB — BASIC METABOLIC PANEL
Anion gap: 9 (ref 5–15)
BUN: 17 mg/dL (ref 8–23)
CO2: 27 mmol/L (ref 22–32)
Calcium: 8.9 mg/dL (ref 8.9–10.3)
Chloride: 102 mmol/L (ref 98–111)
Creatinine, Ser: 1.22 mg/dL (ref 0.61–1.24)
GFR calc Af Amer: >60 mL/min (ref >60)
GFR, EST NON AFRICAN AMERICAN: 54 mL/min — AB (ref >60)
Glucose, Bld: 125 mg/dL — ABNORMAL HIGH (ref 70–99)
Potassium: 4 mmol/L (ref 3.5–5.1)
Sodium: 138 mmol/L (ref 135–145)

## 2018-11-11 LAB — ABO/RH: ABO/RH(D): O POS

## 2018-11-11 LAB — PROTIME-INR
INR: 1.19
Prothrombin Time: 14.9 seconds (ref 11.4–15.2)

## 2018-11-11 MED ORDER — ONDANSETRON HCL 4 MG/2ML IJ SOLN: 4.0000 mg | Freq: Four times a day (QID) | INTRAMUSCULAR | Status: DC | PRN

## 2018-11-11 MED ORDER — ALBUTEROL SULFATE (2.5 MG/3ML) 0.083% IN NEBU: 2.5000 mg | INHALATION_SOLUTION | Freq: Four times a day (QID) | RESPIRATORY_TRACT | Status: DC | PRN

## 2018-11-11 MED ORDER — HYDROCODONE-ACETAMINOPHEN 5-325 MG PO TABS: 1.0000 | ORAL_TABLET | ORAL | Status: DC | PRN

## 2018-11-11 MED ORDER — ACETAMINOPHEN 325 MG PO TABS: 650.0000 mg | ORAL_TABLET | Freq: Four times a day (QID) | ORAL | Status: DC | PRN

## 2018-11-11 MED ORDER — CEFAZOLIN SODIUM-DEXTROSE 2-4 GM/100ML-% IV SOLN
2.0000 g | INTRAVENOUS | Status: AC
  Administered 2018-11-12: 2 g via INTRAVENOUS
  Filled 2018-11-11: qty 100

## 2018-11-11 MED ORDER — TRANEXAMIC ACID 1000 MG/10ML IV SOLN
2000.0000 mg | INTRAVENOUS | Status: DC
  Filled 2018-11-11: qty 20

## 2018-11-11 MED ORDER — POLYETHYLENE GLYCOL 3350 17 G PO PACK: 17.0000 g | PACK | Freq: Every day | ORAL | Status: DC | PRN

## 2018-11-11 MED ORDER — POVIDONE-IODINE 10 % EX SWAB: 2.0000 "application " | Freq: Once | CUTANEOUS | Status: DC

## 2018-11-11 MED ORDER — METOPROLOL SUCCINATE ER 50 MG PO TB24
50.0000 mg | ORAL_TABLET | Freq: Every day | ORAL | Status: DC
  Administered 2018-11-12 – 2018-11-17 (×6): 50 mg via ORAL
  Filled 2018-11-11 (×6): qty 1

## 2018-11-11 MED ORDER — ACETAMINOPHEN 650 MG RE SUPP: 650.0000 mg | Freq: Four times a day (QID) | RECTAL | Status: DC | PRN

## 2018-11-11 MED ORDER — CLOBETASOL PROPIONATE 0.05 % EX OINT
1.0000 "application " | TOPICAL_OINTMENT | Freq: Two times a day (BID) | CUTANEOUS | Status: DC
  Administered 2018-11-12 – 2018-11-17 (×8): 1 via TOPICAL
  Filled 2018-11-11: qty 15

## 2018-11-11 MED ORDER — HYDROCERIN EX CREA
1.0000 "application " | TOPICAL_CREAM | CUTANEOUS | Status: DC | PRN
  Filled 2018-11-11: qty 113

## 2018-11-11 MED ORDER — ONDANSETRON HCL 4 MG PO TABS: 4.0000 mg | ORAL_TABLET | Freq: Four times a day (QID) | ORAL | Status: DC | PRN

## 2018-11-11 MED ORDER — TRANEXAMIC ACID-NACL 1000-0.7 MG/100ML-% IV SOLN
1000.0000 mg | INTRAVENOUS | Status: AC
  Administered 2018-11-12: 1000 mg via INTRAVENOUS

## 2018-11-11 MED ORDER — MORPHINE SULFATE (PF) 2 MG/ML IV SOLN
2.0000 mg | INTRAVENOUS | Status: DC | PRN
  Administered 2018-11-11: 2 mg via INTRAVENOUS
  Filled 2018-11-11: qty 1

## 2018-11-11 MED ORDER — SODIUM CHLORIDE 0.9 % IV SOLN
INTRAVENOUS | Status: DC
  Administered 2018-11-11: 23:00:00 via INTRAVENOUS

## 2018-11-11 MED ORDER — PRAVASTATIN SODIUM 40 MG PO TABS
40.0000 mg | ORAL_TABLET | Freq: Every day | ORAL | Status: DC
  Administered 2018-11-12 – 2018-11-17 (×6): 40 mg via ORAL
  Filled 2018-11-11 (×6): qty 1

## 2018-11-11 MED ORDER — ALBUTEROL SULFATE HFA 108 (90 BASE) MCG/ACT IN AERS: 2.0000 | INHALATION_SPRAY | Freq: Four times a day (QID) | RESPIRATORY_TRACT | Status: DC | PRN

## 2018-11-11 NOTE — H&P (Signed)
[facility] History and Physical  [name] MBT:[id number] DOB: 1932/01/15 DOA: 11/11/2018  Referring physician: ED  PCP: Rutherford Guys, MD   Chief Complaint: Right hip pain  HPI: [name] is a 85 y.o. male with past medical history of hypertension, hyperlipidemia, basal cell cancer, history of stroke, from home, presented to the hospital after sustaining a mechanical fall in his bathroom today while changing his clothes he lost his footing with his caused him to fall and hit the right side of his hip.  Patient presented with right hip pain and was unable to stand on his legs.  Patient denies any dizziness, lightheadedness, chest pain, palpitation or diaphoresis prior to fall.  There is no mention of loss of consciousness.  Denies being on any blood thinners.  Patient does have a history of eczema and takes prednisone as needed.  He is taking Fosamax because of the fact that he is on prednisone.  I also spoke with the patient's daughter at bedside.  Patient uses a walker for ambulation at home after his stroke he had some issues with balance.  He recently had radial nerve injury on his right upper extremity and had physical therapy at home.  Normally, patient is ambulatory with the help of walker.  He denies any shortness of breath chest pain palpitation while walking. Patient denies any shortness of breath, cough, fever or chills.  Denies any urinary urgency, frequency or dysuria.  ED Course: In the ED, patient had a x-ray of the right knee which showed mildly displaced and possibly comminuted intertrochanteric and basicervical fracture of the right hip.  Orthopedics Dr Erlinda Hong was consulted from the ED. orthopedics recommended admission at the [facility] for orthopedic surgery.  Review of Systems:  All systems were reviewed and were negative unless otherwise mentioned in the HPI  Past Medical History:  Diagnosis Date  . Allergy    seasonal  fall  . Cancer ([place])    Basal cell carcinoma; multiple  . Eczema   . Hyperlipidemia   . Hypertension   . Osteoporosis   . Stroke [facility])    Past Surgical History:  Procedure Laterality Date  . CATARACT EXTRACTION, BILATERAL  06/29/2015  . INTRAMEDULLARY (IM) NAIL INTERTROCHANTERIC Left 03/30/2017   Procedure: INTRAMEDULLARY (IM) NAIL INTERTROCHANTRIC;  Surgeon: [name], MD;  Location: [place];  Service: Orthopedics;  Laterality: Left;  . MOHS SURGERY    . TONSILLECTOMY  1940 maybe    Social History:  reports that he has never smoked. He has never used smokeless tobacco. He reports that he does not drink alcohol or use drugs.  Allergies  Allergen Reactions  . Seasonal Ic [Cholestatin]     Family History  Problem Relation Age of Onset  . Arthritis Mother   . Heart disease Mother 19  . Hypertension Father      Prior to Admission medications   Medication Sig Start Date End Date Taking? Authorizing Provider  albuterol (PROVENTIL HFA;VENTOLIN HFA) 108 (90 Base) MCG/ACT inhaler Inhale 2 puffs into the lungs every 6 (six) hours as needed for wheezing or shortness of breath. 09/29/18  Yes Forrest Moron, MD  alendronate (FOSAMAX) 70 MG tablet Take 1 tablet (70 mg total) by mouth every 7 (seven) days. Take with a full glass of water on an empty stomach. 07/27/18  Yes Rutherford Guys, MD  aspirin EC 81 MG EC tablet Take 1 tablet (81 mg total) by mouth daily. 01/21/18  Yes Vega,  [place], MD  clobetasol ointment (TEMOVATE) 4.58 % Apply 1 application topically 2 (two) times daily. 07/27/18  Yes Rutherford Guys, MD  metoprolol succinate (TOPROL-XL) 50 MG 24 hr tablet Take 1 tablet (50 mg total) by mouth daily. Take with or immediately following a meal. 07/27/18  Yes Rutherford Guys, MD  pravastatin (PRAVACHOL) 40 MG tablet Take 1 tablet (40 mg total) by mouth daily. 07/27/18  Yes Rutherford Guys, MD  predniSONE (DELTASONE) 20 MG tablet Take 2 tablets (40 mg total) by mouth daily. Patient taking differently: Take  40 mg by mouth daily as needed (eczema).  07/27/18  Yes Rutherford Guys, MD  Skin Protectants, Misc. (EUCERIN) cream Apply 1 application topically as needed for dry skin. 02/18/18  Yes Wardell Honour, MD  Triamcinolone Acetonide (TRIAMCINOLONE 0.1 % CREAM : EUCERIN) CREA Apply 1 application topically 2 (two) times daily. 11/05/18  Yes Rutherford Guys, MD  Misc. Devices [facility]) MISC Use walker whenever walking, standard walker 07/27/18   Rutherford Guys, MD    Physical Exam: Vitals:   11/11/18 1428  BP: (!) 145/79  Pulse: 75  Resp: 17  Temp: 97.6 F (36.4 C)  TempSrc: Oral  SpO2: 98%   Wt Readings from Last 3 Encounters:  09/29/18 69.2 kg  09/11/18 70.6 kg  07/27/18 68.9 kg   There is no height or weight on file to calculate BMI.  General:  Average built, not in obvious distress, HENT: Normocephalic, pupils equally reacting to light and accommodation.  No scleral pallor or icterus noted. Oral mucosa is moist.  Mild eye discharge noted bilaterally Chest:  Clear breath sounds.  Diminished breath sounds bilaterally. No crackles or wheezes.  CVS: S1 &S2 heard. No murmur.  Regular rate and rhythm.  PVCs noted Abdomen: Soft, nontender, nondistended.  Bowel sounds are heard.  Liver is not palpable, no abdominal mass palpated Extremities: No cyanosis, clubbing or edema.  Peripheral pulses are palpable.  Right lower extremity external rotation with tenderness over the right hip. Psych: Alert, awake and oriented, normal mood CNS:  No cranial nerve deficits.  Power equal in all extremities.   No cerebellar signs.   Skin: Warm and dry.  Skin with eczematous rash  Labs on Admission:  Basic Metabolic Panel: Recent Labs  Lab 11/11/18 1456  NA 138  K 4.0  CL 102  CO2 27  GLUCOSE 125*  BUN 17  CREATININE 1.22  CALCIUM 8.9   Liver Function Tests: No results for input(s): AST, ALT, ALKPHOS, BILITOT, PROT, ALBUMIN in the last 168 hours. No results for input(s): LIPASE, AMYLASE in the  last 168 hours. No results for input(s): AMMONIA in the last 168 hours. CBC: Recent Labs  Lab 11/11/18 1456  WBC 16.5*  NEUTROABS 14.0*  HGB 15.0  HCT 46.0  MCV 98.7  PLT 198   Cardiac Enzymes: No results for input(s): CKTOTAL, CKMB, CKMBINDEX, TROPONINI in the last 168 hours.  BNP (last 3 results) No results for input(s): BNP in the last 8760 hours.  ProBNP (last 3 results) No results for input(s): PROBNP in the last 8760 hours.  CBG: No results for input(s): GLUCAP in the last 168 hours.   Radiological Exams on Admission: Dg Knee Complete 4 Views Right  Result Date: 11/11/2018 CLINICAL DATA:  Right knee swelling after fall today. EXAM: RIGHT KNEE - COMPLETE 4+ VIEW COMPARISON:  None. FINDINGS: No evidence of fracture, dislocation, or joint effusion. Moderate narrowing of the medial and lateral joint spaces  is noted. Soft tissues are unremarkable. IMPRESSION: Moderate degenerative joint disease. No acute abnormality seen in the right knee. Electronically Signed   By: Marijo Conception, M.D.   On: 11/11/2018 15:39   Dg Hip Unilat With Pelvis 2-3 Views Right  Result Date: 11/11/2018 CLINICAL DATA:  Right hip pain after fall. EXAM: DG HIP (WITH OR WITHOUT PELVIS) 2-3V RIGHT COMPARISON:  Radiographs of March 29, 2017. FINDINGS: Mildly displaced and possibly comminuted fracture is seen involving the basicervical and intertrochanteric region of the proximal right femur. Status post previous surgical fixation of left hip. IMPRESSION: Mildly displaced and possibly comminuted fracture involving basicervical and intertrochanteric region of proximal right femur. Electronically Signed   By: Marijo Conception, M.D.   On: 11/11/2018 15:36    EKG: EKG was reviewed by me which shows evidence of normal sinus rhythm with PVCs  Assessment/Plan Principal Problem:   Hip fracture ([place]) Active Problems:   Dyslipidemia   Benign essential HTN   History of CVA (cerebrovascular accident)  Right  intertrochanteric hip fracture status post mechanical fall.  Orthopedics has been notified.  We will keep the patient n.p.o from midnight.  Focus on analgesia.  Patient will be [facility] for further care.  Hold off with aspirin low-dose for tonight.  Hold Fosamax for now.  Patient is at low to moderate risk for immediate surgery.  He does not require any cardiac testing at this time.  History of hyperlipidemia.  Continue pravastatin  History of hypertension.  Continue on metoprolol.  PVCs.  Asymptomatic.  Continue metoprolol  Eczema of the skin.  Patient takes prednisone as needed.  Will hold for now.  Hold Fosamax.  Consultant: Orthopedics Dr. Erlinda Hong has been notified by the ED physician  Code Status: DNR  DVT Prophylaxis: Lovenox  Family Communication:  Patients' condition and plan of care including tests being ordered have been discussed with the patient and patient's daughter at bedside who indicate understanding and agree with the plan.  Disposition Plan: Likely to rehabilitation in 2 to 3 days.  Severity of Illness: The appropriate patient status for this patient is INPATIENT. Inpatient status is judged to be reasonable and necessary in order to provide the required intensity of service to ensure the patient's safety. The patient's presenting symptoms, physical exam findings, and initial radiographic and laboratory data in the context of their chronic comorbidities is felt to place them at high risk for further clinical deterioration. Furthermore, it is not anticipated that the patient will be medically stable for discharge from the hospital within 2 midnights of admission.    Signed, Flora Lipps, MD [facility] 11/11/2018

## 2018-11-11 NOTE — ED Triage Notes (Signed)
Pt arrived via EMS from home. Pt had a fall in the bathroom today and states that he lost his footing. Pt c/o rt thigh pain. Pt does report falling to his rt hip. Pt denies LOC,denies Blood thinners. Pt was not able to bear weight on rt leg, pt has some external rotation to rt foot.      EMS 153/80, HR 94, RR 18, 98% RA

## 2018-11-11 NOTE — ED Provider Notes (Signed)
[place] DEPT Provider Note   CSN: [id number] Arrival date & time: 11/11/18  1400     History   Chief Complaint Chief Complaint  Patient presents with  . Fall  . Leg Pain    HPI [name] is a 85 y.o. male presenting for evaluation of right leg pain after fall.  Patient states he was trying to take off his trousers when he fell sideways, hitting his right hip against the counter in the bathroom.  He reports acute onset right hip pain.  He has been unable to ambulate or bear weight on his right leg since.  He denies hitting his head or loss of consciousness.  He is not on blood thinners.  He denies pain elsewhere.  He denies numbness or tingling of the leg.  He denies head pain, neck pain, back pain, chest pain, loss of bowel bladder control.  Additional history obtained from chart review, patient with a history of hypertension, hyperlipidemia, previous left hip fracture.  Patient is on aspirin, no blood thinner noted in the medical chart.  Pt states he lives at home by himself.   HPI  Past Medical History:  Diagnosis Date  . Allergy    seasonal  fall  . Cancer ([place])    Basal cell carcinoma; multiple  . Eczema   . Hyperlipidemia   . Hypertension   . Osteoporosis   . Stroke [facility])     Patient Active Problem List   Diagnosis Date Noted  . S/P right hip fracture 11/11/2018  . Community acquired pneumonia of right lower lobe of lung ([place]) 09/29/2018  . Recurrent falls 05/12/2018  . History of CVA (cerebrovascular accident) 01/16/2018  . Nocturnal enuresis   . Fall   . Full incontinence of feces   . Benign essential HTN   . Hypoalbuminemia due to protein-calorie malnutrition ([place])   . Abnormality of gait   . Leg edema   . Hip fracture ([place]) 03/30/2017  . Leukocytosis 03/29/2017  . Mild intermittent asthma with acute exacerbation 01/29/2017  . Chronic renal insufficiency 09/22/2015  . Tachycardia 09/22/2015  . PVC (premature  ventricular contraction) 09/22/2015  . Osteoporosis 06/03/2014  . Basal cell carcinoma of face 06/03/2014  . Benign prostatic hyperplasia 05/27/2013  . Eczema 04/23/2012  . Dyslipidemia 04/23/2012    Past Surgical History:  Procedure Laterality Date  . CATARACT EXTRACTION, BILATERAL  06/29/2015  . INTRAMEDULLARY (IM) NAIL INTERTROCHANTERIC Left 03/30/2017   Procedure: INTRAMEDULLARY (IM) NAIL INTERTROCHANTRIC;  Surgeon: [name], MD;  Location: [place];  Service: Orthopedics;  Laterality: Left;  . MOHS SURGERY    . TONSILLECTOMY  1940 maybe        Home Medications    Prior to Admission medications   Medication Sig Start Date End Date Taking? Authorizing Provider  albuterol (PROVENTIL HFA;VENTOLIN HFA) 108 (90 Base) MCG/ACT inhaler Inhale 2 puffs into the lungs every 6 (six) hours as needed for wheezing or shortness of breath. 09/29/18  Yes Forrest Moron, MD  alendronate (FOSAMAX) 70 MG tablet Take 1 tablet (70 mg total) by mouth every 7 (seven) days. Take with a full glass of water on an empty stomach. 07/27/18  Yes Rutherford Guys, MD  aspirin EC 81 MG EC tablet Take 1 tablet (81 mg total) by mouth daily. 01/21/18  Yes Velvet Bathe, MD  clobetasol ointment (TEMOVATE) 4.31 % Apply 1 application topically 2 (two) times daily. 07/27/18  Yes Rutherford Guys, MD  metoprolol succinate (  TOPROL-XL) 50 MG 24 hr tablet Take 1 tablet (50 mg total) by mouth daily. Take with or immediately following a meal. 07/27/18  Yes Rutherford Guys, MD  pravastatin (PRAVACHOL) 40 MG tablet Take 1 tablet (40 mg total) by mouth daily. 07/27/18  Yes Rutherford Guys, MD  predniSONE (DELTASONE) 20 MG tablet Take 2 tablets (40 mg total) by mouth daily. Patient taking differently: Take 40 mg by mouth daily as needed (eczema).  07/27/18  Yes Rutherford Guys, MD  Skin Protectants, Misc. (EUCERIN) cream Apply 1 application topically as needed for dry skin. 02/18/18  Yes Wardell Honour, MD  Triamcinolone  Acetonide (TRIAMCINOLONE 0.1 % CREAM : EUCERIN) CREA Apply 1 application topically 2 (two) times daily. 11/05/18  Yes Rutherford Guys, MD  Misc. Devices [facility]) MISC Use walker whenever walking, standard walker 07/27/18   Rutherford Guys, MD    Family History Family History  Problem Relation Age of Onset  . Arthritis Mother   . Heart disease Mother 75  . Hypertension Father     Social History Social History   Tobacco Use  . Smoking status: Never Smoker  . Smokeless tobacco: Never Used  Substance Use Topics  . Alcohol use: No    Comment: occas  . Drug use: No     Allergies   Seasonal ic [cholestatin]   Review of Systems Review of Systems  Musculoskeletal: Positive for arthralgias.  All other systems reviewed and are negative.    Physical Exam Updated Vital Signs BP (!) 164/91 (BP Location: Left Arm)   Pulse 98   Temp 97.6 F (36.4 C) (Oral)   Resp 18   SpO2 99%   Physical Exam Vitals signs and nursing note reviewed.  Constitutional:      General: He is not in acute distress.    Appearance: He is well-developed.     Comments: Elderly male resting comfortably in the bed in no acute distress  HENT:     Head: Normocephalic and atraumatic.     Comments: No obvious head trauma. Eyes:     Conjunctiva/sclera: Conjunctivae normal.     Pupils: Pupils are equal, round, and reactive to light.  Neck:     Musculoskeletal: Normal range of motion and neck supple.  Cardiovascular:     Rate and Rhythm: Normal rate and regular rhythm.     Pulses: Normal pulses.  Pulmonary:     Effort: Pulmonary effort is normal. No respiratory distress.     Breath sounds: Normal breath sounds. No wheezing.  Abdominal:     General: There is no distension.     Palpations: Abdomen is soft. There is no mass.     Tenderness: There is no abdominal tenderness. There is no guarding or rebound.  Musculoskeletal: Normal range of motion.        General: Tenderness present.     Comments:  Tenderness palpation of the right hip.  Right leg shortened and externally rotated.  Abrasion of the right knee.  Pedal pulses intact bilaterally.  Sensation of lower extremities intact.  Skin:    General: Skin is warm and dry.     Capillary Refill: Capillary refill takes less than 2 seconds.  Neurological:     Mental Status: He is alert and oriented to person, place, and time.      ED Treatments / Results  Labs (all labs ordered are listed, but only abnormal results are displayed) Labs Reviewed  BASIC METABOLIC PANEL - Abnormal; Notable  for the following components:      Result Value   Glucose, Bld 125 (*)    GFR calc non Af Amer 54 (*)    All other components within normal limits  CBC WITH DIFFERENTIAL/PLATELET - Abnormal; Notable for the following components:   WBC 16.5 (*)    Neutro Abs 14.0 (*)    Monocytes Absolute 1.1 (*)    Abs Immature Granulocytes 0.11 (*)    All other components within normal limits  PROTIME-INR  TYPE AND SCREEN    EKG EKG Interpretation  Date/Time:  Wednesday November 11 2018 15:04:19 EST Ventricular Rate:  80 PR Interval:  130 QRS Duration: 92 QT Interval:  432 QTC Calculation: 498 R Axis:   39 Text Interpretation:  Sinus rhythm with frequent Premature ventricular complexes Prolonged QT Abnormal ECG No STEMI.  Confirmed by Nanda Quinton [phone]) on 11/11/2018 3:08:45 PM   Radiology Dg Knee Complete 4 Views Right  Result Date: 11/11/2018 CLINICAL DATA:  Right knee swelling after fall today. EXAM: RIGHT KNEE - COMPLETE 4+ VIEW COMPARISON:  None. FINDINGS: No evidence of fracture, dislocation, or joint effusion. Moderate narrowing of the medial and lateral joint spaces is noted. Soft tissues are unremarkable. IMPRESSION: Moderate degenerative joint disease. No acute abnormality seen in the right knee. Electronically Signed   By: Marijo Conception, M.D.   On: 11/11/2018 15:39   Dg Hip Unilat With Pelvis 2-3 Views Right  Result Date:  11/11/2018 CLINICAL DATA:  Right hip pain after fall. EXAM: DG HIP (WITH OR WITHOUT PELVIS) 2-3V RIGHT COMPARISON:  Radiographs of March 29, 2017. FINDINGS: Mildly displaced and possibly comminuted fracture is seen involving the basicervical and intertrochanteric region of the proximal right femur. Status post previous surgical fixation of left hip. IMPRESSION: Mildly displaced and possibly comminuted fracture involving basicervical and intertrochanteric region of proximal right femur. Electronically Signed   By: Marijo Conception, M.D.   On: 11/11/2018 15:36    Procedures Procedures (including critical care time)  Medications Ordered in ED Medications - No data to display   Initial Impression / Assessment and Plan / ED Course  I have reviewed the triage vital signs and the nursing notes.  Pertinent labs & imaging results that were available during my care of the patient were reviewed by me and considered in my medical decision making (see chart for details).     Patient presenting for evaluation after mechanical fall.  Physical exam concerning for right hip fracture, as his right leg is shortened and externally rotated.  However, patient is neurovascularly intact.  Additionally, patient with injury/abrasion of the right knee.  Will obtain imaging of the hip and knee.  Patient denies head injury, is not on blood thinners.  No loss of consciousness.  As such, I do not believe he needs head CT or cervical CT at this time.  Will obtain preop clearance labs and EKG. Case discussed with attending, Dr. Tomi Bamberger evaluated the pt.   X-ray viewed interpreted by me, shows intertrochanteric R hip fx. x-ray of the knee reassuring.  Discussed findings with patient and family.  Will consult with orthopedics.  Discussed with Dr. Erlinda Hong from [place], who requests patient be admitted to the hospitalist at [facility] with a plan for surgery tomorrow. Discussed with Dr. Louanne Belton from [facility], pt to be admitted.    Final Clinical Impressions(s) / ED Diagnoses   Final diagnoses:  Closed fracture of right hip, initial encounter [facility])  Fall, initial encounter  ED Discharge Orders    None       Franchot Heidelberg, PA-C 11/11/18 1942    Dorie Rank, MD 11/12/18 [phone]

## 2018-11-11 NOTE — ED Notes (Signed)
[facility] dispatch notified for need of transport.  

## 2018-11-11 NOTE — ED Notes (Signed)
Report  Called to [place].

## 2018-11-11 NOTE — ED Provider Notes (Signed)
Medical screening examination/treatment/procedure(s) were conducted as a shared visit with non-physician practitioner(s) and myself.  I personally evaluated the patient during the encounter.  EKG Interpretation  Date/Time:  Wednesday November 11 2018 15:04:19 EST Ventricular Rate:  80 PR Interval:  130 QRS Duration: 92 QT Interval:  432 QTC Calculation: 498 R Axis:   39 Text Interpretation:  Sinus rhythm with frequent Premature ventricular complexes Prolonged QT Abnormal ECG No STEMI.  Confirmed by Nanda Quinton [phone]) on 11/11/2018 3:08:45 PM  Patient presented to the emergency room for evaluation after a fall.  Patient had significant pain in his right hip unable to stand.  I have personally reviewed the x-rays.  They are consistent with an intertrochanteric hip fracture.  Formal radiology results are pending.  Anticipate admission orthopedic consultation.   Dorie Rank, MD 11/11/18 [phone]

## 2018-11-11 NOTE — ED Notes (Signed)
ED TO INPATIENT HANDOFF REPORT  Name/Age/Gender [name] 85 y.o. male  Code Status    Code Status Orders  (From admission, onward)         Start     Ordered   11/11/18 1725  Do not attempt resuscitation (DNR)  Continuous    Question Answer Comment  In the event of cardiac or respiratory ARREST Do not call a "code blue"   In the event of cardiac or respiratory ARREST Do not perform Intubation, CPR, defibrillation or ACLS   In the event of cardiac or respiratory ARREST Use medication by any route, position, wound care, and other measures to relive pain and suffering. May use oxygen, suction and manual treatment of airway obstruction as needed for comfort.   Comments Patient has a DNR order and a full code order. Patient and family stated that he is to be a DNR.      11/11/18 1725        Code Status History    Date Active Date Inactive Code Status Order ID Comments User Context   01/17/2018 0106 01/20/2018 2212 DNR [id number]  Toy Baker, MD Inpatient   04/06/2017 1709 04/15/2017 1836 DNR [id number]  Milus Mallick, RN Inpatient   04/01/2017 1610 04/06/2017 1709 Full Code [id number]  Cathlyn Parsons, PA-C Inpatient   04/01/2017 1610 04/01/2017 1610 DNR [id number]  Cathlyn Parsons, PA-C Inpatient   03/29/2017 2238 04/01/2017 1607 DNR [id number]  Toy Baker, MD Inpatient    Advance Directive Documentation     Most Recent Value  Type of Advance Directive  Living will  Pre-existing out of facility DNR order (yellow form or pink MOST form)  -  "MOST" Form in Place?  -      Home/SNF/Other Home  Chief Complaint fall/leg pain  Level of Care/Admitting Diagnosis ED Disposition    ED Disposition Condition [place]: [facility] [[id number]]  Level of Care: Med-Surg [16]  Diagnosis: S/P right hip fracture [[id number]]  Admitting Physician: Flora Lipps [[id number]]  Attending Physician: Flora Lipps [[id number]]  Estimated length of  stay: 3 - 4 days  Certification:: I certify this patient will need inpatient services for at least 2 midnights  PT Class (Do Not Modify): Inpatient [101]  PT Acc Code (Do Not Modify): Private [1]       Medical History Past Medical History:  Diagnosis Date  . Allergy    seasonal  fall  . Cancer ([place])    Basal cell carcinoma; multiple  . Eczema   . Hyperlipidemia   . Hypertension   . Osteoporosis   . Stroke [facility])     Allergies Allergies  Allergen Reactions  . Seasonal Ic [Cholestatin]     IV Location/Drains/Wounds Patient Lines/Drains/Airways Status   Active Line/Drains/Airways    Name:   Placement date:   Placement time:   Site:   Days:   Wound / Incision (Open or Dehisced) 01/17/18 Leg Left Left shin   01/17/18    0700    Leg   298          Labs/Imaging Results for orders placed or performed during the hospital encounter of 11/11/18 (from the past 48 hour(s))  Basic metabolic panel     Status: Abnormal   Collection Time: 11/11/18  2:56 PM  Result Value Ref Range   Sodium 138 135 - 145 mmol/L   Potassium 4.0 3.5 - 5.1 mmol/L   Chloride 102 98 - 111  mmol/L   CO2 27 22 - 32 mmol/L   Glucose, Bld 125 (H) 70 - 99 mg/dL   BUN 17 8 - 23 mg/dL   Creatinine, Ser 1.22 0.61 - 1.24 mg/dL   Calcium 8.9 8.9 - 10.3 mg/dL   GFR calc non Af Amer 54 (L) >60 mL/min   GFR calc Af Amer >60 >60 mL/min   Anion gap 9 5 - 15    Comment: Performed at [facility], [place] [address], [place], [place] [address]  CBC WITH DIFFERENTIAL     Status: Abnormal   Collection Time: 11/11/18  2:56 PM  Result Value Ref Range   WBC 16.5 (H) 4.0 - 10.5 K/uL   RBC 4.66 4.22 - 5.81 MIL/uL   Hemoglobin 15.0 13.0 - 17.0 g/dL   HCT 46.0 39.0 - 52.0 %   MCV 98.7 80.0 - 100.0 fL   MCH 32.2 26.0 - 34.0 pg   MCHC 32.6 30.0 - 36.0 g/dL   RDW 12.2 11.5 - 15.5 %   Platelets 198 150 - 400 K/uL   nRBC 0.0 0.0 - 0.2 %   Neutrophils Relative % 84 %   Neutro Abs 14.0 (H) 1.7 - 7.7 K/uL    Lymphocytes Relative 7 %   Lymphs Abs 1.1 0.7 - 4.0 K/uL   Monocytes Relative 7 %   Monocytes Absolute 1.1 (H) 0.1 - 1.0 K/uL   Eosinophils Relative 1 %   Eosinophils Absolute 0.1 0.0 - 0.5 K/uL   Basophils Relative 0 %   Basophils Absolute 0.1 0.0 - 0.1 K/uL   Immature Granulocytes 1 %   Abs Immature Granulocytes 0.11 (H) 0.00 - 0.07 K/uL    Comment: Performed at [facility], [place] [address] [place], [place] [address]  Protime-INR     Status: None   Collection Time: 11/11/18  2:56 PM  Result Value Ref Range   Prothrombin Time 14.9 11.4 - 15.2 seconds   INR 1.19     Comment: Performed at [facility], [place] [address] [place], [place] [address]   Dg Knee Complete 4 Views Right  Result Date: 11/11/2018 CLINICAL DATA:  Right knee swelling after fall today. EXAM: RIGHT KNEE - COMPLETE 4+ VIEW COMPARISON:  None. FINDINGS: No evidence of fracture, dislocation, or joint effusion. Moderate narrowing of the medial and lateral joint spaces is noted. Soft tissues are unremarkable. IMPRESSION: Moderate degenerative joint disease. No acute abnormality seen in the right knee. Electronically Signed   By: Marijo Conception, M.D.   On: 11/11/2018 15:39   Dg Hip Unilat With Pelvis 2-3 Views Right  Result Date: 11/11/2018 CLINICAL DATA:  Right hip pain after fall. EXAM: DG HIP (WITH OR WITHOUT PELVIS) 2-3V RIGHT COMPARISON:  Radiographs of March 29, 2017. FINDINGS: Mildly displaced and possibly comminuted fracture is seen involving the basicervical and intertrochanteric region of the proximal right femur. Status post previous surgical fixation of left hip. IMPRESSION: Mildly displaced and possibly comminuted fracture involving basicervical and intertrochanteric region of proximal right femur. Electronically Signed   By: Marijo Conception, M.D.   On: 11/11/2018 15:36   EKG Interpretation  Date/Time:  Wednesday November 11 2018 15:04:19 EST Ventricular Rate:  80 PR  Interval:  130 QRS Duration: 92 QT Interval:  432 QTC Calculation: 498 R Axis:   39 Text Interpretation:  Sinus rhythm with frequent Premature ventricular complexes Prolonged QT Abnormal ECG No STEMI.  Confirmed by Nanda Quinton [phone]) on 11/11/2018 3:08:45  PM   Pending Labs Unresulted Labs (From admission, onward)    Start     Ordered   11/11/18 1420  Type and screen [place],   STAT    Comments:  [place]    11/11/18 1420   Signed and Held  CBC  (enoxaparin (LOVENOX)    CrCl >/= 30 ml/min)  Once,   R    Comments:  Baseline for enoxaparin therapy IF NOT ALREADY DRAWN.  Notify MD if PLT < 100 K.    Signed and Held   Signed and Held  Creatinine, serum  (enoxaparin (LOVENOX)    CrCl >/= 30 ml/min)  Once,   R    Comments:  Baseline for enoxaparin therapy IF NOT ALREADY DRAWN.    Signed and Held   Signed and Held  Creatinine, serum  (enoxaparin (LOVENOX)    CrCl >/= 30 ml/min)  Weekly,   R    Comments:  while on enoxaparin therapy    Signed and Held   Signed and Held  Basic metabolic panel  Tomorrow morning,   R     Signed and Held   Signed and Held  CBC  Tomorrow morning,   R     Signed and Held   Signed and Held  Protime-INR  Tomorrow morning,   R     Signed and Held          Vitals/Pain Today's Vitals   11/11/18 1422 11/11/18 1428 11/11/18 1724  BP:  (!) 145/79 (!) 164/91  Pulse:  75 98  Resp:  17 18  Temp:  97.6 F (36.4 C)   TempSrc:  Oral   SpO2:  98% 99%  PainSc: 5       Isolation Precautions No active isolations  Medications Medications - No data to display  Mobility non-ambulatory

## 2018-11-11 NOTE — ED Notes (Signed)
RN not available to take report at this time.

## 2018-11-12 ENCOUNTER — Ambulatory Visit: Payer: Medicare Other | Admitting: Family Medicine

## 2018-11-12 ENCOUNTER — Inpatient Hospital Stay (HOSPITAL_COMMUNITY): Payer: Medicare Other

## 2018-11-12 ENCOUNTER — Inpatient Hospital Stay (HOSPITAL_COMMUNITY): Payer: Medicare Other | Admitting: Certified Registered Nurse Anesthetist

## 2018-11-12 ENCOUNTER — Encounter (HOSPITAL_COMMUNITY): Payer: Self-pay

## 2018-11-12 ENCOUNTER — Encounter (HOSPITAL_COMMUNITY)
Admission: EM | Disposition: A | Discharge status: Skilled Nursing Facility | Payer: Self-pay | Source: Home / Self Care | Attending: Internal Medicine

## 2018-11-12 DIAGNOSIS — S72001A Fracture of unspecified part of neck of right femur, initial encounter for closed fracture: Secondary | ICD-10-CM

## 2018-11-12 HISTORY — PX: INTRAMEDULLARY (IM) NAIL INTERTROCHANTERIC: SHX5875

## 2018-11-12 LAB — CBC
HCT: 39 % (ref 39.0–52.0)
Hemoglobin: 13.2 g/dL (ref 13.0–17.0)
MCH: 31.5 pg (ref 26.0–34.0)
MCHC: 33.8 g/dL (ref 30.0–36.0)
MCV: 93.1 fL (ref 80.0–100.0)
NRBC: 0 % (ref 0.0–0.2)
Platelets: 166 10*3/uL (ref 150–400)
RBC: 4.19 MIL/uL — ABNORMAL LOW (ref 4.22–5.81)
RDW: 12 % (ref 11.5–15.5)
WBC: 11.6 10*3/uL — ABNORMAL HIGH (ref 4.0–10.5)

## 2018-11-12 LAB — BASIC METABOLIC PANEL
Anion gap: 9 (ref 5–15)
BUN: 12 mg/dL (ref 8–23)
CO2: 24 mmol/L (ref 22–32)
Calcium: 8.6 mg/dL — ABNORMAL LOW (ref 8.9–10.3)
Chloride: 103 mmol/L (ref 98–111)
Creatinine, Ser: 1.11 mg/dL (ref 0.61–1.24)
GFR calc non Af Amer: >60 mL/min (ref >60)
Glucose, Bld: 120 mg/dL — ABNORMAL HIGH (ref 70–99)
Potassium: 3.7 mmol/L (ref 3.5–5.1)
SODIUM: 136 mmol/L (ref 135–145)

## 2018-11-12 LAB — PROTIME-INR
INR: 1.06
PROTHROMBIN TIME: 13.8 s (ref 11.4–15.2)

## 2018-11-12 LAB — SURGICAL PCR SCREEN
MRSA, PCR: NEGATIVE
Staphylococcus aureus: POSITIVE — AB

## 2018-11-12 SURGERY — FIXATION, FRACTURE, INTERTROCHANTERIC, WITH INTRAMEDULLARY ROD
Anesthesia: General | Site: Leg Upper | Laterality: Right

## 2018-11-12 MED ORDER — ROCURONIUM BROMIDE 50 MG/5ML IV SOSY
PREFILLED_SYRINGE | INTRAVENOUS | Status: AC
  Filled 2018-11-12: qty 5

## 2018-11-12 MED ORDER — ONDANSETRON HCL 4 MG PO TABS: 4.0000 mg | ORAL_TABLET | Freq: Four times a day (QID) | ORAL | Status: DC | PRN

## 2018-11-12 MED ORDER — PHENYLEPHRINE 40 MCG/ML (10ML) SYRINGE FOR IV PUSH (FOR BLOOD PRESSURE SUPPORT)
PREFILLED_SYRINGE | INTRAVENOUS | Status: DC | PRN
  Administered 2018-11-12: 40 ug via INTRAVENOUS
  Administered 2018-11-12: 80 ug via INTRAVENOUS
  Administered 2018-11-12: 40 ug via INTRAVENOUS
  Administered 2018-11-12 (×4): 80 ug via INTRAVENOUS

## 2018-11-12 MED ORDER — MORPHINE SULFATE (PF) 2 MG/ML IV SOLN: 1.0000 mg | INTRAVENOUS | Status: DC | PRN

## 2018-11-12 MED ORDER — LIDOCAINE 2% (20 MG/ML) 5 ML SYRINGE
INTRAMUSCULAR | Status: AC
  Filled 2018-11-12: qty 5

## 2018-11-12 MED ORDER — 0.9 % SODIUM CHLORIDE (POUR BTL) OPTIME
TOPICAL | Status: DC | PRN
  Administered 2018-11-12: 1000 mL

## 2018-11-12 MED ORDER — TRANEXAMIC ACID-NACL 1000-0.7 MG/100ML-% IV SOLN
INTRAVENOUS | Status: AC
  Filled 2018-11-12: qty 100

## 2018-11-12 MED ORDER — METHOCARBAMOL 1000 MG/10ML IJ SOLN
500.0000 mg | Freq: Four times a day (QID) | INTRAVENOUS | Status: DC | PRN
  Filled 2018-11-12: qty 5

## 2018-11-12 MED ORDER — OXYCODONE-ACETAMINOPHEN 5-325 MG PO TABS: 1.0000 | ORAL_TABLET | Freq: Four times a day (QID) | ORAL | 0 refills | Status: DC | PRN

## 2018-11-12 MED ORDER — FENTANYL CITRATE (PF) 250 MCG/5ML IJ SOLN
INTRAMUSCULAR | Status: AC
  Filled 2018-11-12: qty 5

## 2018-11-12 MED ORDER — MUPIROCIN 2 % EX OINT
1.0000 "application " | TOPICAL_OINTMENT | Freq: Two times a day (BID) | CUTANEOUS | Status: AC
  Administered 2018-11-12 – 2018-11-16 (×10): 1 via NASAL
  Filled 2018-11-12 (×3): qty 22

## 2018-11-12 MED ORDER — HYDROCODONE-ACETAMINOPHEN 7.5-325 MG PO TABS
1.0000 | ORAL_TABLET | ORAL | Status: DC | PRN
  Administered 2018-11-13 (×2): 1 via ORAL
  Filled 2018-11-12 (×2): qty 1

## 2018-11-12 MED ORDER — ROCURONIUM BROMIDE 10 MG/ML (PF) SYRINGE
PREFILLED_SYRINGE | INTRAVENOUS | Status: DC | PRN
  Administered 2018-11-12: 50 mg via INTRAVENOUS

## 2018-11-12 MED ORDER — POLYETHYLENE GLYCOL 3350 17 G PO PACK
17.0000 g | PACK | Freq: Every day | ORAL | Status: DC | PRN
  Administered 2018-11-15 (×2): 17 g via ORAL
  Filled 2018-11-12 (×2): qty 1

## 2018-11-12 MED ORDER — HYDROCODONE-ACETAMINOPHEN 5-325 MG PO TABS
1.0000 | ORAL_TABLET | ORAL | Status: DC | PRN
  Administered 2018-11-14 – 2018-11-16 (×3): 1 via ORAL
  Filled 2018-11-12 (×3): qty 1

## 2018-11-12 MED ORDER — PHENOL 1.4 % MT LIQD: 1.0000 | OROMUCOSAL | Status: DC | PRN

## 2018-11-12 MED ORDER — ACETAMINOPHEN 500 MG PO TABS
500.0000 mg | ORAL_TABLET | Freq: Four times a day (QID) | ORAL | Status: AC
  Administered 2018-11-12 – 2018-11-13 (×4): 500 mg via ORAL
  Filled 2018-11-12 (×4): qty 1

## 2018-11-12 MED ORDER — SODIUM CHLORIDE 0.9 % IV SOLN: INTRAVENOUS | Status: DC

## 2018-11-12 MED ORDER — SORBITOL 70 % SOLN: 30.0000 mL | Freq: Every day | Status: DC | PRN

## 2018-11-12 MED ORDER — PROPOFOL 10 MG/ML IV BOLUS
INTRAVENOUS | Status: DC | PRN
  Administered 2018-11-12: 50 mg via INTRAVENOUS

## 2018-11-12 MED ORDER — ALUM & MAG HYDROXIDE-SIMETH 200-200-20 MG/5ML PO SUSP: 30.0000 mL | ORAL | Status: DC | PRN

## 2018-11-12 MED ORDER — ONDANSETRON HCL 4 MG/2ML IJ SOLN
INTRAMUSCULAR | Status: DC | PRN
  Administered 2018-11-12: 4 mg via INTRAVENOUS

## 2018-11-12 MED ORDER — SUGAMMADEX SODIUM 200 MG/2ML IV SOLN
INTRAVENOUS | Status: DC | PRN
  Administered 2018-11-12: 200 mg via INTRAVENOUS

## 2018-11-12 MED ORDER — FENTANYL CITRATE (PF) 100 MCG/2ML IJ SOLN
INTRAMUSCULAR | Status: DC | PRN
  Administered 2018-11-12 (×2): 50 ug via INTRAVENOUS
  Administered 2018-11-12: 25 ug via INTRAVENOUS

## 2018-11-12 MED ORDER — CEFAZOLIN SODIUM-DEXTROSE 2-4 GM/100ML-% IV SOLN
2.0000 g | Freq: Four times a day (QID) | INTRAVENOUS | Status: AC
  Administered 2018-11-12 – 2018-11-13 (×3): 2 g via INTRAVENOUS
  Filled 2018-11-12 (×3): qty 100

## 2018-11-12 MED ORDER — CHLORHEXIDINE GLUCONATE CLOTH 2 % EX PADS
6.0000 | MEDICATED_PAD | Freq: Every day | CUTANEOUS | Status: AC
  Administered 2018-11-12 – 2018-11-16 (×5): 6 via TOPICAL

## 2018-11-12 MED ORDER — HYDROCODONE-ACETAMINOPHEN 7.5-325 MG PO TABS: 1.0000 | ORAL_TABLET | ORAL | Status: DC | PRN

## 2018-11-12 MED ORDER — METHOCARBAMOL 500 MG PO TABS: 500.0000 mg | ORAL_TABLET | Freq: Four times a day (QID) | ORAL | Status: DC | PRN

## 2018-11-12 MED ORDER — ENOXAPARIN SODIUM 40 MG/0.4ML ~~LOC~~ SOLN: 40.0000 mg | Freq: Every day | SUBCUTANEOUS | 0 refills | Status: DC

## 2018-11-12 MED ORDER — MENTHOL 3 MG MT LOZG: 1.0000 | LOZENGE | OROMUCOSAL | Status: DC | PRN

## 2018-11-12 MED ORDER — DOCUSATE SODIUM 100 MG PO CAPS
100.0000 mg | ORAL_CAPSULE | Freq: Two times a day (BID) | ORAL | Status: DC
  Administered 2018-11-12 – 2018-11-13 (×2): 100 mg via ORAL
  Filled 2018-11-12 (×2): qty 1

## 2018-11-12 MED ORDER — ACETAMINOPHEN 325 MG PO TABS
325.0000 mg | ORAL_TABLET | Freq: Four times a day (QID) | ORAL | Status: DC | PRN
  Administered 2018-11-14 – 2018-11-16 (×3): 650 mg via ORAL
  Filled 2018-11-12 (×4): qty 2

## 2018-11-12 MED ORDER — PROPOFOL 10 MG/ML IV BOLUS
INTRAVENOUS | Status: AC
  Filled 2018-11-12: qty 20

## 2018-11-12 MED ORDER — ENOXAPARIN SODIUM 40 MG/0.4ML ~~LOC~~ SOLN
40.0000 mg | SUBCUTANEOUS | Status: DC
  Administered 2018-11-13 – 2018-11-17 (×5): 40 mg via SUBCUTANEOUS
  Filled 2018-11-12 (×5): qty 0.4

## 2018-11-12 MED ORDER — LIDOCAINE 2% (20 MG/ML) 5 ML SYRINGE
INTRAMUSCULAR | Status: DC | PRN
  Administered 2018-11-12: 70 mg via INTRAVENOUS

## 2018-11-12 MED ORDER — MAGNESIUM CITRATE PO SOLN: 1.0000 | Freq: Once | ORAL | Status: DC | PRN

## 2018-11-12 MED ORDER — HYDROCODONE-ACETAMINOPHEN 5-325 MG PO TABS: 1.0000 | ORAL_TABLET | ORAL | Status: DC | PRN

## 2018-11-12 MED ORDER — DEXAMETHASONE SODIUM PHOSPHATE 10 MG/ML IJ SOLN
INTRAMUSCULAR | Status: DC | PRN
  Administered 2018-11-12: 8 mg via INTRAVENOUS

## 2018-11-12 MED ORDER — ONDANSETRON HCL 4 MG/2ML IJ SOLN
INTRAMUSCULAR | Status: AC
  Filled 2018-11-12: qty 2

## 2018-11-12 MED ORDER — ONDANSETRON HCL 4 MG/2ML IJ SOLN: 4.0000 mg | Freq: Four times a day (QID) | INTRAMUSCULAR | Status: DC | PRN

## 2018-11-12 MED ORDER — DEXAMETHASONE SODIUM PHOSPHATE 10 MG/ML IJ SOLN
INTRAMUSCULAR | Status: AC
  Filled 2018-11-12: qty 1

## 2018-11-12 MED ORDER — SODIUM CHLORIDE 0.9 % IV SOLN
INTRAVENOUS | Status: DC | PRN
  Administered 2018-11-12: 20 ug/min via INTRAVENOUS

## 2018-11-12 SURGICAL SUPPLY — 46 items
BIT DRILL SHORT 4.0 (BIT) IMPLANT
BNDG COHESIVE 4X5 TAN NS LF (GAUZE/BANDAGES/DRESSINGS) ×3 IMPLANT
BNDG COHESIVE 6X5 TAN STRL LF (GAUZE/BANDAGES/DRESSINGS) IMPLANT
BNDG GAUZE ELAST 4 BULKY (GAUZE/BANDAGES/DRESSINGS) ×3 IMPLANT
COVER PERINEAL POST (MISCELLANEOUS) ×3 IMPLANT
COVER SURGICAL LIGHT HANDLE (MISCELLANEOUS) ×3 IMPLANT
COVER WAND RF STERILE (DRAPES) ×3 IMPLANT
DRAPE C-ARMOR (DRAPES) ×2 IMPLANT
DRAPE STERI IOBAN 125X83 (DRAPES) ×3 IMPLANT
DRILL BIT SHORT 4.0 (BIT) ×2
DRSG MEPILEX BORDER 4X4 (GAUZE/BANDAGES/DRESSINGS) ×5 IMPLANT
DRSG MEPILEX BORDER 4X8 (GAUZE/BANDAGES/DRESSINGS) ×3 IMPLANT
DRSG PAD ABDOMINAL 8X10 ST (GAUZE/BANDAGES/DRESSINGS) ×6 IMPLANT
DRSG XEROFORM 1X8 (GAUZE/BANDAGES/DRESSINGS) ×4 IMPLANT
DURAPREP 26ML APPLICATOR (WOUND CARE) ×3 IMPLANT
ELECT REM PT RETURN 9FT ADLT (ELECTROSURGICAL) ×3
ELECTRODE REM PT RTRN 9FT ADLT (ELECTROSURGICAL) ×1 IMPLANT
GAUZE XEROFORM 1X8 LF (GAUZE/BANDAGES/DRESSINGS) ×2 IMPLANT
GLOVE BIOGEL PI IND STRL 7.0 (GLOVE) ×1 IMPLANT
GLOVE BIOGEL PI INDICATOR 7.0 (GLOVE) ×2
GLOVE ECLIPSE 7.0 STRL STRAW (GLOVE) ×3 IMPLANT
GLOVE SKINSENSE NS SZ7.5 (GLOVE) ×4
GLOVE SKINSENSE STRL SZ7.5 (GLOVE) ×2 IMPLANT
GOWN STRL REIN XL XLG (GOWN DISPOSABLE) ×3 IMPLANT
GUIDE PIN 3.2X343 (PIN) ×2
GUIDE PIN 3.2X343MM (PIN) ×4
KIT BASIN OR (CUSTOM PROCEDURE TRAY) ×3 IMPLANT
KIT TURNOVER KIT B (KITS) ×3 IMPLANT
MANIFOLD NEPTUNE II (INSTRUMENTS) ×3 IMPLANT
NAIL TRIGEN INTERTAN RT 10X40 (Nail) ×2 IMPLANT
NS IRRIG 1000ML POUR BTL (IV SOLUTION) ×3 IMPLANT
PACK GENERAL/GYN (CUSTOM PROCEDURE TRAY) ×3 IMPLANT
PAD ARMBOARD 7.5X6 YLW CONV (MISCELLANEOUS) ×6 IMPLANT
PAD CAST 4YDX4 CTTN HI CHSV (CAST SUPPLIES) ×2 IMPLANT
PADDING CAST COTTON 4X4 STRL (CAST SUPPLIES) ×4
PIN GUIDE 3.2X343MM (PIN) IMPLANT
SCREW LAG COMPR KIT 100/95 (Screw) ×2 IMPLANT
SCREW TRIGEN LOW PROF 5.0X37.5 (Screw) ×2 IMPLANT
STAPLER VISISTAT 35W (STAPLE) ×3 IMPLANT
SUT VIC AB 0 CT1 27 (SUTURE) ×2
SUT VIC AB 0 CT1 27XBRD ANBCTR (SUTURE) ×1 IMPLANT
SUT VIC AB 2-0 CT1 27 (SUTURE) ×4
SUT VIC AB 2-0 CT1 TAPERPNT 27 (SUTURE) ×1 IMPLANT
TOWEL OR 17X24 6PK STRL BLUE (TOWEL DISPOSABLE) ×3 IMPLANT
TOWEL OR 17X26 10 PK STRL BLUE (TOWEL DISPOSABLE) ×3 IMPLANT
WATER STERILE IRR 1000ML POUR (IV SOLUTION) ×3 IMPLANT

## 2018-11-12 NOTE — Anesthesia Preprocedure Evaluation (Addendum)
Anesthesia Evaluation  Patient identified by MRN, date of birth, ID band Patient awake    Reviewed: Allergy & Precautions, H&P , NPO status , Patient's Chart, lab work & pertinent test results, reviewed documented beta blocker date and time   Airway Mallampati: III  TM Distance: >3 FB Neck ROM: Full    Dental no notable dental hx. (+) Teeth Intact, Dental Advisory Given   Pulmonary asthma ,    Pulmonary exam normal breath sounds clear to auscultation       Cardiovascular hypertension, Pt. on medications and Pt. on home beta blockers  Rhythm:Regular Rate:Normal     Neuro/Psych CVA, No Residual Symptoms negative psych ROS   GI/Hepatic negative GI ROS, Neg liver ROS,   Endo/Other  negative endocrine ROS  Renal/GU negative Renal ROS  negative genitourinary   Musculoskeletal   Abdominal   Peds  Hematology negative hematology ROS (+)   Anesthesia Other Findings   Reproductive/Obstetrics negative OB ROS                            Anesthesia Physical Anesthesia Plan  ASA: II  Anesthesia Plan: General   Post-op Pain Management:    Induction: Intravenous  PONV Risk Score and Plan: 3 and Ondansetron, Dexamethasone and Treatment may vary due to age or medical condition  Airway Management Planned: Oral ETT  Additional Equipment:   Intra-op Plan:   Post-operative Plan: Extubation in OR  Informed Consent: I have reviewed the patients History and Physical, chart, labs and discussed the procedure including the risks, benefits and alternatives for the proposed anesthesia with the patient or authorized representative who has indicated his/her understanding and acceptance.     Dental advisory given  Plan Discussed with: CRNA  Anesthesia Plan Comments:         Anesthesia Quick Evaluation

## 2018-11-12 NOTE — Transfer of Care (Addendum)
Immediate Anesthesia Transfer of Care Note  Patient: [name]  Procedure(s) Performed: INTRAMEDULLARY (IM) NAIL RIGHT INTERTROCHANTERIC HIP FRACTURE (Right Leg Upper)  Patient Location: PACU  Anesthesia Type:General  Level of Consciousness: drowsy  Airway & Oxygen Therapy: Patient Spontanous Breathing and Patient connected to face mask oxygen  Post-op Assessment: Report given to RN and Post -op Vital signs reviewed and stable  Post vital signs: Reviewed and stable  Last Vitals:  Vitals Value Taken Time  BP 120/52   Temp 36.2   Pulse 75 11/12/2018  3:36 PM  Resp 24 11/12/2018  3:37 PM  SpO2 99 % 11/12/2018  3:36 PM  Vitals shown include unvalidated device data.  Last Pain:  Vitals:   11/12/18 1325  TempSrc:   PainSc: 4       Patients Stated Pain Goal: 2 (49/35/52 1747)  Complications: No apparent anesthesia complications

## 2018-11-12 NOTE — Progress Notes (Addendum)
PROGRESS NOTE  [name] RUE:[id number] DOB: 04/02/1932 DOA: 11/11/2018 PCP: Rutherford Guys, MD  HPI/Recap of past 24 hours: [name] is a 85 y.o. male with past medical history of hypertension, hyperlipidemia, basal cell cancer, history of stroke, from home, presented to the hospital after sustaining a mechanical fall in his bathroom. Denies any dizziness, lightheadedness, chest pain, palpitation or diaphoresis prior to fall.  There is no mention of loss of consciousness.    X-ray of the right hip showed mildly displaced and possibly comminuted fracture involving basicervical and intertrochanteric fracture of the right hip.  Orthopedic surgery Dr. Erlinda Hong consulted and following.  Plan for surgical repair.   11/12/2018: Patient seen and examined at bedside.  No acute events overnight.  Pain is well controlled on current pain management.  He has no new complaints.   Assessment/Plan: Principal Problem:   Hip fracture (HCC) Active Problems:   Dyslipidemia   Benign essential HTN   History of CVA (cerebrovascular accident)   S/P right hip fracture  Right intertrochanteric hip fracture status post mechanical fall.  Orthopedic surgery is following.  Plan for surgical repair.  Continue pain management and bowel regimen.  Low to moderate risk for immediate surgery.  He is on IV fluid normal saline at 75 cc/h and IV morphine as needed for severe pain.  Will get and review daily labs, CBC and BMP.  History of hyperlipidemia.  Continue pravastatin  History of hypertension.    Vital signs are stable.  Continue on metoprolol.  PVCs.  Asymptomatic.  Continue metoprolol.  Continue to closely monitor vital signs.  Eczema of the skin.  Patient takes prednisone as needed.  Will hold for now.  Hold Fosamax.  History of CVA Hold off aspirin for now until after surgery  Ambulatory dysfunction post right hip fracture PT to assess post surgery CSF consulted for placement to SNF  Consultant:  Orthopedics Dr. Erlinda Hong   Code Status: DNR  DVT Prophylaxis: Lovenox  Family Communication:   None at bedside  Disposition Plan: Likely to rehabilitation in 2 to 3 days.     Objective: Vitals:   11/11/18 2045 11/11/18 2203 11/11/18 2210 11/12/18 0401  BP:  (!) 148/91  119/72  Pulse: 88 90  82  Resp: (!) 22   18  Temp:  97.9 F (36.6 C)  (!) 97.5 F (36.4 C)  TempSrc:  Oral  Oral  SpO2: 97% 96%  95%  Weight:   66.7 kg   Height:   5\' 10"  (1.778 m)     Intake/Output Summary (Last 24 hours) at 11/12/2018 0908 Last data filed at 11/12/2018 0500 Gross per 24 hour  Intake -  Output 200 ml  Net -200 ml   Filed Weights   11/11/18 2210  Weight: 66.7 kg    Exam:  . General: 85 y.o. year-old male well developed well nourished in no acute distress.  Alert and interactive. . Cardiovascular: Regular rate and rhythm with no rubs or gallops.  No thyromegaly or JVD noted.   Marland Kitchen Respiratory: Clear to auscultation with no wheezes or rales. Good inspiratory effort. . Abdomen: Soft nontender nondistended with normal bowel sounds x4 quadrants. . Musculoskeletal: No lower extremity edema. 2/4 pulses in all 4 extremities.  Right lower extremity is externally rotated . Psychiatry: Mood is appropriate for condition and setting   Data Reviewed: CBC: Recent Labs  Lab 11/11/18 1456 11/11/18 2225 11/12/18 0147  WBC 16.5* 12.8* 11.6*  NEUTROABS 14.0*  --   --  HGB 15.0 14.3 13.2  HCT 46.0 42.6 39.0  MCV 98.7 93.6 93.1  PLT 198 185 010   Basic Metabolic Panel: Recent Labs  Lab 11/11/18 1456 11/11/18 2225 11/12/18 0147  NA 138  --  136  K 4.0  --  3.7  CL 102  --  103  CO2 27  --  24  GLUCOSE 125*  --  120*  BUN 17  --  12  CREATININE 1.22 1.14 1.11  CALCIUM 8.9  --  8.6*   GFR: Estimated Creatinine Clearance: 46.7 mL/min (by C-G formula based on SCr of 1.11 mg/dL). Liver Function Tests: No results for input(s): AST, ALT, ALKPHOS, BILITOT, PROT, ALBUMIN in the last 168  hours. No results for input(s): LIPASE, AMYLASE in the last 168 hours. No results for input(s): AMMONIA in the last 168 hours. Coagulation Profile: Recent Labs  Lab 11/11/18 1456 11/12/18 0147  INR 1.19 1.06   Cardiac Enzymes: No results for input(s): CKTOTAL, CKMB, CKMBINDEX, TROPONINI in the last 168 hours. BNP (last 3 results) No results for input(s): PROBNP in the last 8760 hours. HbA1C: No results for input(s): HGBA1C in the last 72 hours. CBG: No results for input(s): GLUCAP in the last 168 hours. Lipid Profile: No results for input(s): CHOL, HDL, LDLCALC, TRIG, CHOLHDL, LDLDIRECT in the last 72 hours. Thyroid Function Tests: No results for input(s): TSH, T4TOTAL, FREET4, T3FREE, THYROIDAB in the last 72 hours. Anemia Panel: No results for input(s): VITAMINB12, FOLATE, FERRITIN, TIBC, IRON, RETICCTPCT in the last 72 hours. Urine analysis:    Component Value Date/Time   COLORURINE YELLOW 01/17/2018 0327   APPEARANCEUR CLEAR 01/17/2018 0327   LABSPEC 1.021 01/17/2018 0327   PHURINE 5.0 01/17/2018 0327   GLUCOSEU NEGATIVE 01/17/2018 0327   HGBUR NEGATIVE 01/17/2018 0327   BILIRUBINUR NEGATIVE 01/17/2018 0327   BILIRUBINUR small (A) 01/16/2018 1551   BILIRUBINUR neg 06/03/2014 1014   KETONESUR 20 (A) 01/17/2018 0327   PROTEINUR NEGATIVE 01/17/2018 0327   UROBILINOGEN 4.0 (A) 01/16/2018 1551   NITRITE NEGATIVE 01/17/2018 0327   LEUKOCYTESUR NEGATIVE 01/17/2018 0327   Sepsis Labs: @LABRCNTIP (procalcitonin:4,lacticidven:4)  ) Recent Results (from the past 240 hour(s))  Surgical pcr screen     Status: Abnormal   Collection Time: 11/11/18 10:50 PM  Result Value Ref Range Status   MRSA, PCR NEGATIVE NEGATIVE Final   Staphylococcus aureus POSITIVE (A) NEGATIVE Final    Comment: (NOTE) The Xpert SA Assay (FDA approved for NASAL specimens in patients 72 years of age and older), is one component of a comprehensive surveillance program. It is not intended to diagnose  infection nor to guide or monitor treatment. Performed at [facility], [place] [address], [place], [place] [address]       Studies: Dg Knee Complete 4 Views Right  Result Date: 11/11/2018 CLINICAL DATA:  Right knee swelling after fall today. EXAM: RIGHT KNEE - COMPLETE 4+ VIEW COMPARISON:  None. FINDINGS: No evidence of fracture, dislocation, or joint effusion. Moderate narrowing of the medial and lateral joint spaces is noted. Soft tissues are unremarkable. IMPRESSION: Moderate degenerative joint disease. No acute abnormality seen in the right knee. Electronically Signed   By: Marijo Conception, M.D.   On: 11/11/2018 15:39   Dg Hip Unilat With Pelvis 2-3 Views Right  Result Date: 11/11/2018 CLINICAL DATA:  Right hip pain after fall. EXAM: DG HIP (WITH OR WITHOUT PELVIS) 2-3V RIGHT COMPARISON:  Radiographs of March 29, 2017. FINDINGS: Mildly displaced and possibly comminuted fracture  is seen involving the basicervical and intertrochanteric region of the proximal right femur. Status post previous surgical fixation of left hip. IMPRESSION: Mildly displaced and possibly comminuted fracture involving basicervical and intertrochanteric region of proximal right femur. Electronically Signed   By: Marijo Conception, M.D.   On: 11/11/2018 15:36    Scheduled Meds: . Chlorhexidine Gluconate Cloth  6 each Topical Daily  . clobetasol ointment  1 application Topical BID  . metoprolol succinate  50 mg Oral Daily  . mupirocin ointment  1 application Nasal BID  . povidone-iodine  2 application Topical Once  . pravastatin  40 mg Oral Daily  . tranexamic acid (CYKLOKAPRON) topical -INTRAOP  2,000 mg Topical To OR    Continuous Infusions: . sodium chloride 75 mL/hr at 11/11/18 2256  .  ceFAZolin (ANCEF) IV    . tranexamic acid       LOS: 1 day     Kayleen Memos, MD [facility] Pager [phone]  If 7PM-7AM, please contact night-coverage [email or web address] Password [email or web address] 11/12/2018, 9:08 AM

## 2018-11-12 NOTE — Anesthesia Procedure Notes (Signed)
Procedure Name: Intubation Performed by: Milford Cage, CRNA Pre-anesthesia Checklist: Patient identified, Emergency Drugs available, Suction available and Patient being monitored Patient Re-evaluated:Patient Re-evaluated prior to induction Oxygen Delivery Method: Circle System Utilized Preoxygenation: Pre-oxygenation with 100% oxygen Induction Type: IV induction Ventilation: Mask ventilation without difficulty Laryngoscope Size: Mac and 3 Grade View: Grade III Tube type: Oral Tube size: 7.5 mm Number of attempts: 1 Airway Equipment and Method: Oral airway and Bougie stylet Placement Confirmation: ETT inserted through vocal cords under direct vision,  positive ETCO2 and breath sounds checked- equal and bilateral Secured at: 24 cm Tube secured with: Tape Dental Injury: Teeth and Oropharynx as per pre-operative assessment

## 2018-11-12 NOTE — Discharge Instructions (Signed)
° ° °  1. Change dressings as needed °2. May shower but keep incisions covered and dry °3. Take lovenox to prevent blood clots °4. Take stool softeners as needed °5. Take pain meds as needed ° °

## 2018-11-12 NOTE — Op Note (Signed)
   Date of Surgery: 11/12/2018  INDICATIONS: [name] is a 85 y.o.-year-old male who sustained a right hip fracture. The risks and benefits of the procedure discussed with the patient prior to the procedure and all questions were answered; consent was obtained.  PREOPERATIVE DIAGNOSIS: right intertochanteric hip fracture   POSTOPERATIVE DIAGNOSIS: Same   PROCEDURE: Treatment of intertrochanteric fracture with intramedullary implant. CPT [phone]   SURGEON: N. Eduard Roux, M.D.   ASSIST: Ciro Backer [place], [place]; necessary for the timely completion of procedure and due to complexity of procedure.  ANESTHESIA: general   IV FLUIDS AND URINE: See anesthesia record   ESTIMATED BLOOD LOSS: 150 cc  IMPLANTS: [employer] InterTAN 10 x 40, 100/95 lag screws  DRAINS: None.   COMPLICATIONS: None.   DESCRIPTION OF PROCEDURE: The patient was brought to the operating room and placed supine on the operating table. The patient's leg had been signed prior to the procedure. The patient had the anesthesia placed by the anesthesiologist. The prep verification and incision time-outs were performed to confirm that this was the correct patient, site, side and location. The patient had an SCD on the opposite lower extremity. The patient did receive antibiotics prior to the incision and was re-dosed during the procedure as needed at indicated intervals. The patient was positioned on the fracture table with the table in traction and internal rotation to reduce the hip. The well leg was placed in a scissor position and all bony prominences were well-padded. The patient had the lower extremity prepped and draped in the standard surgical fashion. The incision was made 4 finger breadths superior to the greater trochanter. A guide pin was inserted into the tip of the greater trochanter under fluoroscopic guidance. An opening reamer was used to gain access to the femoral canal. The nail length was measured and  inserted down the femoral canal to its proper depth. The appropriate version of insertion for the lag screw was found under fluoroscopy. A pin was inserted up the femoral neck through the jig. Then, a second antirotation pin was inserted inferior to the first pin. The length of the lag screw was then measured. The lag screw was inserted as near to center-center in the head as possible. The antirotation pin was then taken out and an interdigitating compression screw was placed in its place. The leg was taken out of traction, then the interdigitating compression screw was used to compress across the fracture. Compression was visualized on serial xrays. A static distal interlocking screw was placed using the perfect circle technique.  The wound was copiously irrigated with saline and the subcutaneous layer closed with 2.0 vicryl and the skin was reapproximated with staples. The wounds were cleaned and dried a final time and a sterile dressing was placed. The hip was taken through a range of motion at the end of the case under fluoroscopic imaging to visualize the approach-withdraw phenomenon and confirm implant length in the head. The patient was then awakened from anesthesia and taken to the recovery room in stable condition. All counts were correct at the end of the case.   POSTOPERATIVE PLAN: The patient will be weight bearing as tolerated and will return in 2 weeks for staple removal and the patient will receive DVT prophylaxis based on other medications, activity level, and risk ratio of bleeding to thrombosis.   Wesley Cecil, MD [facility] [phone] 3:11 PM

## 2018-11-12 NOTE — Consult Note (Signed)
ORTHOPAEDIC CONSULTATION  REQUESTING PHYSICIAN: Kayleen Memos, DO  Chief Complaint: Right hip fracture  HPI: [name] is a 85 y.o. male who presents with right hip fracture s/p mechanical fall PTA.  He was changing his clothes in the bathroom when he lost his balance.  He takes fosamax for his osteoporosis.  The patient endorses severe pain in the right hip, that does not radiate, grinding in quality, worse with any movement, better with immobilization.  Denies LOC/fever/chills/nausea/vomiting.  Walks with assistive devices (walker, cane, wheelchair).  Does live independently.  Denies LOC, neck pain, abd pain. Patient had a left intertrochanteric hip fracture in 2018 that was fixed by Dr. Marlou Sa.  Past Medical History:  Diagnosis Date  . Allergy    seasonal  fall  . Cancer ([place])    Basal cell carcinoma; multiple  . Eczema   . Hyperlipidemia   . Hypertension   . Osteoporosis   . Stroke [facility])    Past Surgical History:  Procedure Laterality Date  . CATARACT EXTRACTION, BILATERAL  06/29/2015  . INTRAMEDULLARY (IM) NAIL INTERTROCHANTERIC Left 03/30/2017   Procedure: INTRAMEDULLARY (IM) NAIL INTERTROCHANTRIC;  Surgeon: [name], MD;  Location: [place];  Service: Orthopedics;  Laterality: Left;  . MOHS SURGERY    . TONSILLECTOMY  1940 maybe   Social History   Socioeconomic History  . Marital status: Married    Spouse name: Not on file  . Number of children: 2  . Years of education: Not on file  . Highest education level: Not on file  Occupational History  . Occupation: retired  Scientific laboratory technician  . Financial resource strain: Not on file  . Food insecurity:    Worry: Not on file    Inability: Not on file  . Transportation needs:    Medical: Not on file    Non-medical: Not on file  Tobacco Use  . Smoking status: Never Smoker  . Smokeless tobacco: Never Used  Substance and Sexual Activity  . Alcohol use: No    Comment: occas  . Drug use: No  . Sexual activity: Not  Currently  Lifestyle  . Physical activity:    Days per week: Not on file    Minutes per session: Not on file  . Stress: Not on file  Relationships  . Social connections:    Talks on phone: Not on file    Gets together: Not on file    Attends religious service: Not on file    Active member of club or organization: Not on file    Attends meetings of clubs or organizations: Not on file    Relationship status: Not on file  Other Topics Concern  . Not on file  Social History Narrative   Marital status: divorced; good friend with ex-wife; not dating      Children: 2 children; 4 grandchildren; no gg      Lives:  Alone in house; daughter six blocks away      Employment: college professor; retired in 1997; history; [place]:  Never      Alcohol: tequila loves but won't buy it.      Exercise:  As much as can; previous competitive biking.      ADLs: quit mowing grass in 2017; no assistant devices; cooks and cleans and pays bills.  No driving; HAS NEVER DRIVEN; has always ridden bike.  Daughter takes patient to grocery store and to appointments.  Daughter drives patient to  grocery store.      Advanced Directives:  No living; DNR; do not resuscitate.  HCPOA: [name] Cummings   Family History  Problem Relation Age of Onset  . Arthritis Mother   . Heart disease Mother 41  . Hypertension Father    Allergies  Allergen Reactions  . Seasonal Ic [Cholestatin]    Prior to Admission medications   Medication Sig Start Date End Date Taking? Authorizing Provider  albuterol (PROVENTIL HFA;VENTOLIN HFA) 108 (90 Base) MCG/ACT inhaler Inhale 2 puffs into the lungs every 6 (six) hours as needed for wheezing or shortness of breath. 09/29/18  Yes Forrest Moron, MD  alendronate (FOSAMAX) 70 MG tablet Take 1 tablet (70 mg total) by mouth every 7 (seven) days. Take with a full glass of water on an empty stomach. 07/27/18  Yes Rutherford Guys, MD  aspirin EC 81 MG EC tablet Take 1 tablet  (81 mg total) by mouth daily. 01/21/18  Yes Velvet Bathe, MD  clobetasol ointment (TEMOVATE) 6.96 % Apply 1 application topically 2 (two) times daily. 07/27/18  Yes Rutherford Guys, MD  metoprolol succinate (TOPROL-XL) 50 MG 24 hr tablet Take 1 tablet (50 mg total) by mouth daily. Take with or immediately following a meal. 07/27/18  Yes Rutherford Guys, MD  pravastatin (PRAVACHOL) 40 MG tablet Take 1 tablet (40 mg total) by mouth daily. 07/27/18  Yes Rutherford Guys, MD  predniSONE (DELTASONE) 20 MG tablet Take 2 tablets (40 mg total) by mouth daily. Patient taking differently: Take 40 mg by mouth daily as needed (eczema).  07/27/18  Yes Rutherford Guys, MD  Skin Protectants, Misc. (EUCERIN) cream Apply 1 application topically as needed for dry skin. 02/18/18  Yes Wardell Honour, MD  Triamcinolone Acetonide (TRIAMCINOLONE 0.1 % CREAM : EUCERIN) CREA Apply 1 application topically 2 (two) times daily. 11/05/18  Yes Rutherford Guys, MD  Misc. Devices [facility]) MISC Use walker whenever walking, standard walker 07/27/18   Rutherford Guys, MD   Dg Knee Complete 4 Views Right  Result Date: 11/11/2018 CLINICAL DATA:  Right knee swelling after fall today. EXAM: RIGHT KNEE - COMPLETE 4+ VIEW COMPARISON:  None. FINDINGS: No evidence of fracture, dislocation, or joint effusion. Moderate narrowing of the medial and lateral joint spaces is noted. Soft tissues are unremarkable. IMPRESSION: Moderate degenerative joint disease. No acute abnormality seen in the right knee. Electronically Signed   By: Marijo Conception, M.D.   On: 11/11/2018 15:39   Dg Hip Unilat With Pelvis 2-3 Views Right  Result Date: 11/11/2018 CLINICAL DATA:  Right hip pain after fall. EXAM: DG HIP (WITH OR WITHOUT PELVIS) 2-3V RIGHT COMPARISON:  Radiographs of March 29, 2017. FINDINGS: Mildly displaced and possibly comminuted fracture is seen involving the basicervical and intertrochanteric region of the proximal right femur. Status post previous  surgical fixation of left hip. IMPRESSION: Mildly displaced and possibly comminuted fracture involving basicervical and intertrochanteric region of proximal right femur. Electronically Signed   By: Marijo Conception, M.D.   On: 11/11/2018 15:36    All pertinent xrays, MRI, CT independently reviewed and interpreted  Positive ROS: All other systems have been reviewed and were otherwise negative with the exception of those mentioned in the HPI and as above.  Physical Exam: General: Alert, no acute distress Cardiovascular: No pedal edema Respiratory: No cyanosis, no use of accessory musculature GI: No organomegaly, abdomen is soft and non-tender Skin: No lesions in the area of chief complaint Neurologic:  Sensation intact distally Psychiatric: Patient is competent for consent with normal mood and affect Lymphatic: No axillary or cervical lymphadenopathy  MUSCULOSKELETAL:  - pain with movement of the hip and extremity - skin intact - NVI distally - compartments soft  Assessment: Right intertrochanteric hip fracture  Plan: - surgical fixation is recommended, patient is aware of r/b/a and wish to proceed - consent obtained - medical optimization per primary team - surgery is planned for today around 2 pm  Thank you for the consult and the opportunity to see [name]. Eduard Roux, MD [place] 7:44 AM

## 2018-11-13 ENCOUNTER — Encounter (HOSPITAL_COMMUNITY): Payer: Self-pay | Admitting: Orthopaedic Surgery

## 2018-11-13 LAB — BASIC METABOLIC PANEL
Anion gap: 10 (ref 5–15)
BUN: 16 mg/dL (ref 8–23)
CALCIUM: 8.1 mg/dL — AB (ref 8.9–10.3)
CO2: 22 mmol/L (ref 22–32)
Chloride: 104 mmol/L (ref 98–111)
Creatinine, Ser: 1.3 mg/dL — ABNORMAL HIGH (ref 0.61–1.24)
GFR calc Af Amer: 58 mL/min — ABNORMAL LOW (ref >60)
GFR calc non Af Amer: 50 mL/min — ABNORMAL LOW (ref >60)
Glucose, Bld: 142 mg/dL — ABNORMAL HIGH (ref 70–99)
Potassium: 4.4 mmol/L (ref 3.5–5.1)
Sodium: 136 mmol/L (ref 135–145)

## 2018-11-13 LAB — CBC
HCT: 34.6 % — ABNORMAL LOW (ref 39.0–52.0)
Hemoglobin: 11.8 g/dL — ABNORMAL LOW (ref 13.0–17.0)
MCH: 32.6 pg (ref 26.0–34.0)
MCHC: 34.1 g/dL (ref 30.0–36.0)
MCV: 95.6 fL (ref 80.0–100.0)
Platelets: 144 10*3/uL — ABNORMAL LOW (ref 150–400)
RBC: 3.62 MIL/uL — ABNORMAL LOW (ref 4.22–5.81)
RDW: 12.1 % (ref 11.5–15.5)
WBC: 13.1 10*3/uL — ABNORMAL HIGH (ref 4.0–10.5)
nRBC: 0 % (ref 0.0–0.2)

## 2018-11-13 MED ORDER — SENNOSIDES-DOCUSATE SODIUM 8.6-50 MG PO TABS
2.0000 | ORAL_TABLET | Freq: Two times a day (BID) | ORAL | Status: DC
  Administered 2018-11-13 – 2018-11-17 (×8): 2 via ORAL
  Filled 2018-11-13 (×8): qty 2

## 2018-11-13 NOTE — Evaluation (Signed)
Physical Therapy Evaluation Patient Details Name: [name] MRN: [id number] DOB: 12/06/1931 Today's Date: 11/13/2018   History of Present Illness  Patient is an 85 y/o male presenting to the ED on 11/11/18 with R hip pain s/p fall at home. Xray revealing R hip fracture. Now s/p R hip IM nail on 11/12/18 with patient to be WBAT. PMH significant for hypertension, hyperlipidemia, basal cell cancer, history of stroke.    Clinical Impression  [name] is a very pleasant 85 y/o male admitted with the above listed diagnosis. Prior to admission patient reports he lived alone in a single story home with ~7 steps to enter, performing mobility at Mod I with RW vs SPC, does have a caregiver for 3hr/day that assist with IADLs and family close by. Patient today primarily limited by pain with mobility. Requires Mod A for bed level mobility with Max A for OOB mobility/transfers. Unable to progress mobility further due to limited tolerance for R LE weight bearing. Cueing throughout for safety and sequencing. Will need short term rehab at discharge to progress functional mobility before returning home.     Follow Up Recommendations SNF    Equipment Recommendations  Other (comment)(defer)    Recommendations for Other Services       Precautions / Restrictions Precautions Precautions: Fall Restrictions Weight Bearing Restrictions: Yes RLE Weight Bearing: Weight bearing as tolerated      Mobility  Bed Mobility Overal bed mobility: Needs Assistance Bed Mobility: Supine to Sit     Supine to sit: Min assist;Mod assist     General bed mobility comments: min to mod A for bed level mobility to come to EOB - difficulty due to pain and limited weight bearing through R hip in sitting  Transfers Overall transfer level: Needs assistance Equipment used: Rolling walker (2 wheeled) Transfers: Sit to/from [facility] Sit to Stand: From elevated surface;Max assist Stand pivot transfers: Max  assist       General transfer comment: Max A for sit to stand and stand pivot transfers with limited weight bearing through R LE; patient moaning in pain throughout with difficulty with upright posturing.   Ambulation/Gait             General Gait Details: deferred due to inability to weight shift  Stairs            Wheelchair Mobility    Modified Rankin (Stroke Patients Only)       Balance Overall balance assessment: Needs assistance Sitting-balance support: Bilateral upper extremity supported;Feet supported Sitting balance-Leahy Scale: Fair     Standing balance support: Bilateral upper extremity supported;During functional activity Standing balance-Leahy Scale: Poor Standing balance comment: reliant on UE support and Max A from PT                             Pertinent Vitals/Pain Pain Assessment: No/denies pain    Home Living Family/patient expects to be discharged to:: Private residence Living Arrangements: Alone Available Help at Discharge: Family;Available PRN/intermittently Type of Home: House Home Access: Stairs to enter Entrance Stairs-Rails: Right;Left;Can reach both Entrance Stairs-Number of Steps: 6-7 Home Layout: One level Home Equipment: Tub bench;Cane - single point;Walker - 2 wheels;Bedside commode Additional Comments: Caregiver present 3 hrs/day Mon-Sat - assist with bathing and housecleaning, meals    Prior Function Level of Independence: Needs assistance   Gait / Transfers Assistance Needed: uses RW vs SPC for mobility  ADL's / Homemaking Assistance Needed: has  caregiver that assists with bathing, cleaning, meals        Hand Dominance   Dominant Hand: Right    Extremity/Trunk Assessment   Upper Extremity Assessment Upper Extremity Assessment: Defer to OT evaluation    Lower Extremity Assessment Lower Extremity Assessment: Generalized weakness;RLE deficits/detail RLE Deficits / Details: does not tolerate weight  bearing. Expected post-op pain and weakness RLE: Unable to fully assess due to pain RLE Sensation: WNL RLE Coordination: decreased fine motor;decreased gross motor    Cervical / Trunk Assessment Cervical / Trunk Assessment: Kyphotic  Communication   Communication: HOH  Cognition Arousal/Alertness: Awake/alert Behavior During Therapy: WFL for tasks assessed/performed Overall Cognitive Status: Within Functional Limits for tasks assessed                                        General Comments      Exercises     Assessment/Plan    PT Assessment Patient needs continued PT services  PT Problem List Decreased strength;Decreased activity tolerance;Decreased balance;Decreased mobility;Decreased coordination;Decreased knowledge of use of DME;Decreased safety awareness       PT Treatment Interventions DME instruction;Gait training;Stair training;Functional mobility training;Therapeutic activities;Therapeutic exercise;Balance training;Patient/family education    PT Goals (Current goals can be found in the Care Plan section)  Acute Rehab PT Goals Patient Stated Goal: reduce pain and return home PT Goal Formulation: With patient Time For Goal Achievement: 11/27/18 Potential to Achieve Goals: Fair    Frequency Min 3X/week   Barriers to discharge Inaccessible home environment;Decreased caregiver support lives alone with steps to enter    Co-evaluation               AM-PAC PT "6 Clicks" Mobility  Outcome Measure Help needed turning from your back to your side while in a flat bed without using bedrails?: A Lot Help needed moving from lying on your back to sitting on the side of a flat bed without using bedrails?: A Lot Help needed moving to and from a bed to a chair (including a wheelchair)?: A Lot Help needed standing up from a chair using your arms (e.g., wheelchair or bedside chair)?: A Lot Help needed to walk in hospital room?: Total Help needed climbing  3-5 steps with a railing? : Total 6 Click Score: 10    End of Session Equipment Utilized During Treatment: Gait belt Activity Tolerance: Patient tolerated treatment well;Patient limited by pain Patient left: in chair;with call bell/phone within reach;with chair alarm set Nurse Communication: Mobility status PT Visit Diagnosis: Unsteadiness on feet (R26.81);Other abnormalities of gait and mobility (R26.89);Muscle weakness (generalized) (M62.81);History of falling (Z91.81)    Time: 5400-8676 PT Time Calculation (min) (ACUTE ONLY): 32 min   Charges:   PT Evaluation $PT Eval Moderate Complexity: 1 Mod PT Treatments $Gait Training: 8-22 mins         Lanney Gins, PT, DPT Supplemental Physical Therapist 11/13/18 11:22 AM Pager: [phone] Office: [phone]

## 2018-11-13 NOTE — Consult Note (Signed)
[facility] CM Primary Care Navigator  11/13/2018  [name] 08-24-1932 [id number]   Went to see patient at the bedside to identify possible discharge needs but he is with physical therapy for assessment and evaluation at the moment.  Per MD note, patient presented to the hospital after sustaining a mechanical fall in his bathroom. (right intertrochanteric hip fracture after a mechanical fall, status post treatment of intertrochanteric fracture with intramedullary implant)  Discharge disposition pending therapy evaluation.  Will attempt to see patient at another time when he is available in the room.      Addendum (11/16/18):  Went back to see patientat the bedside to identify possible discharge needs and met with daughter ([name]) and son [name]- from [place]).   Patient reports thathe had a "fall at home in the bathroom and fractured right hip" that resulted to this admission/ surgery.     Patient endorsesDr. Grant Fontana with [facility].   [facility] [address] [facility].   Patient's daughter and caretaker [name]) are managing his medications at [facility] use of "pill box" system filled every month.  Daughter and caretaker havebeen drivingand transporting patient tohisdoctors' appointments.  Patientis from home with caretaker coming in to assist him Monday to Saturday 4 hours/ day. Daughter (lives close by) serves as the primary caregiver for patient.   Anticipated discharge plan isskilled nursing facility (SNF- still in process)per therapy recommendation.  Patient and family voiced understanding to call primary care provider's office once patientreturns back home,for a post discharge follow-up appointment within1- 2 weeksor sooner if needs arise.Patient letter (with PCP's contact number) wasprovided as a reminder.  Discussed with  patient regarding [facility] CM services available for health management and resourcesat homebut indicated having no current needs for servicesat this point. Patient expressedunderstandingto seekreferralfrom primary care provider to [facility] care management ifdeemed necessary and appropriate for any services in thefuture- once patient gets back home.   [facility] care management information was provided for future needs that patientmay have.  Primary care provider's office is listed as providing transition of care (TOC).    For additional questions please contact:  Edwena Felty A. Quinlee Sciarra, BSN, RN-BC [facility] PRIMARY CARE Navigator Cell: [phone]

## 2018-11-13 NOTE — Care Management Note (Signed)
Case Management Note  Patient Details  Name: Arien Benincasa MRN: [id number] Date of Birth: [birth year]  Subjective/Objective:      Presents with right hip fracture s/p mechanical fall PTA. Lives alone. States caretaker is with him M-S and daughter lives 4 blocks away from him. States daughter supportive and will assist with care once d/c.       [name] Greenhouse-Cumming (Daughter)     [phone]       Action/Plan: Pt evaluation pending.Marland KitchenMarland KitchenNCM will continue to monitor for TOC needs.   Expected Discharge Date:  (unknown)               Expected Discharge Plan:  (DONNA. caretaker)  In-House Referral:     Discharge planning Services  CM Consult  Post Acute Care Choice:    Choice offered to:  Patient  DME Arranged:    DME Agency:     HH Arranged:  PT [place]:  [facility] (now [facility]), pending MD's order  Status of Service:  Completed, signed off  If discussed at [place] of Stay Meetings, dates discussed:    Additional Comments:  Sharin Mons, RN 11/13/2018, 9:40 AM

## 2018-11-13 NOTE — Evaluation (Signed)
Occupational Therapy Evaluation Patient Details Name: [name] MRN: [id number] DOB: 1932/06/09 Today's Date: 11/13/2018    History of Present Illness Patient is an 85 y/o male presenting to the ED on 11/11/18 with R hip pain s/p fall at home. Xray revealing R hip fracture. Now s/p R hip IM nail on 11/12/18 with patient to be WBAT. PMH significant for hypertension, hyperlipidemia, basal cell cancer, history of stroke.   Clinical Impression   Pt admitted with the above diagnoses and presents with below problem list. Pt will benefit from continued acute OT to address the below listed deficits and maximize independence with basic ADLs prior to d/c to next venue. PTA pt was mod I with functional mobility, did need some assist with ADLs for which he had a [place] aide a few hours a day. Pt currently max A with functional transfers and max A +2 for safety with LB ADLs. Stood with rw and mod A for about a minute and a half.      Follow Up Recommendations  SNF    Equipment Recommendations  Other (comment)(defer to next venue)    Recommendations for Other Services       Precautions / Restrictions Precautions Precautions: Fall Restrictions Weight Bearing Restrictions: Yes RLE Weight Bearing: Weight bearing as tolerated      Mobility Bed Mobility               General bed mobility comments: up in chair  Transfers Overall transfer level: Needs assistance Equipment used: Rolling walker (2 wheeled) Transfers: Sit to/from Stand;Stand Pivot Transfers Sit to Stand: From elevated surface;Max assist Stand pivot transfers: Max assist       General transfer comment: Max A to powerup from recliner. Successful on third attempt. Moanig during sit<>stand transitions. Cues for hand placement.    Balance Overall balance assessment: Needs assistance Sitting-balance support: Bilateral upper extremity supported;Feet supported Sitting balance-Leahy Scale: Fair     Standing balance support:  Bilateral upper extremity supported;During functional activity Standing balance-Leahy Scale: Poor Standing balance comment: rw and mod-max A                           ADL either performed or assessed with clinical judgement   ADL Overall ADL's : Needs assistance/impaired Eating/Feeding: Set up;Sitting   Grooming: Sitting;Minimal assistance   Upper Body Bathing: Moderate assistance;Sitting   Lower Body Bathing: Maximal assistance;+2 for safety/equipment;Sit to/from stand   Upper Body Dressing : Sitting;Minimal assistance   Lower Body Dressing: Maximal assistance;+2 for safety/equipment;Sit to/from stand   Toilet Transfer: Maximal assistance;Stand-pivot;BSC;RW Armed forces technical officer Details (indicate cue type and reason): clinical judgement  Toileting- Clothing Manipulation and Hygiene: Maximal assistance;+2 for safety/equipment         General ADL Comments: Pt completed sit<>stand from recliner. Stood 2 minutes with min-mod A to steady.     Vision         Perception     Praxis      Pertinent Vitals/Pain Pain Assessment: Faces Faces Pain Scale: Hurts whole lot Pain Location: R hip during sit<>stand transfers Pain Descriptors / Indicators: Sore;Grimacing;Operative site guarding;Moaning Pain Intervention(s): Limited activity within patient's tolerance;Monitored during session;Premedicated before session;Repositioned     Hand Dominance Right   Extremity/Trunk Assessment Upper Extremity Assessment Upper Extremity Assessment: Generalized weakness   Lower Extremity Assessment Lower Extremity Assessment: Defer to PT evaluation   Cervical / Trunk Assessment Cervical / Trunk Assessment: Kyphotic   Communication Communication Communication: [facility]  Cognition Arousal/Alertness: Awake/alert Behavior During Therapy: WFL for tasks assessed/performed Overall Cognitive Status: Within Functional Limits for tasks assessed                                      General Comments       Exercises     Shoulder Instructions      Home Living Family/patient expects to be discharged to:: Private residence Living Arrangements: Alone Available Help at Discharge: Family;Available PRN/intermittently Type of Home: House Home Access: Stairs to enter [employer] of Steps: 6-7 Entrance Stairs-Rails: Right;Left;Can reach both Home Layout: One level     Bathroom Shower/Tub: Teacher, early years/pre: Standard     Home Equipment: Tub bench;Cane - single point;Walker - 2 wheels;Bedside commode   Additional Comments: Caregiver present 3 hrs/day Mon-Sat - assist with bathing and housecleaning, meals      Prior Functioning/Environment Level of Independence: Needs assistance  Gait / Transfers Assistance Needed: uses RW vs SPC for mobility ADL's / Homemaking Assistance Needed: has caregiver that assists with bathing, cleaning, meals            OT Problem List: Impaired balance (sitting and/or standing);Decreased knowledge of use of DME or AE;Decreased knowledge of precautions;Pain;Decreased activity tolerance      OT Treatment/Interventions: Self-care/ADL training;DME and/or AE instruction;Therapeutic activities;Patient/family education;Balance training    OT Goals(Current goals can be found in the care plan section) Acute Rehab OT Goals Patient Stated Goal: reduce pain and return home OT Goal Formulation: With patient Time For Goal Achievement: 11/27/18 Potential to Achieve Goals: Good ADL Goals Pt Will Perform Lower Body Bathing: with min assist;sit to/from stand Pt Will Perform Lower Body Dressing: with min assist;sit to/from stand Pt Will Transfer to Toilet: with min assist;ambulating Pt Will Perform Toileting - Clothing Manipulation and hygiene: with min assist;sit to/from stand Pt Will Perform Tub/Shower Transfer: with min assist;ambulating;3 in 1;rolling walker Additional ADL Goal #1: Pt will complete bed mobility  at min guard level to prepare for OOB ADLs.  OT Frequency: Min 2X/week   Barriers to D/C:            Co-evaluation              AM-PAC OT "6 Clicks" Daily Activity     Outcome Measure Help from another person eating meals?: None Help from another person taking care of personal grooming?: A Little Help from another person toileting, which includes using toliet, bedpan, or urinal?: A Lot Help from another person bathing (including washing, rinsing, drying)?: A Lot Help from another person to put on and taking off regular upper body clothing?: A Little Help from another person to put on and taking off regular lower body clothing?: A Lot 6 Click Score: 16   End of Session Equipment Utilized During Treatment: Gait belt;Rolling walker  Activity Tolerance: Patient limited by pain Patient left: in chair;with call bell/phone within reach  OT Visit Diagnosis: Unsteadiness on feet (R26.81);Muscle weakness (generalized) (M62.81);Pain Pain - Right/Left: Right Pain - part of body: Hip                Time: 1250-1310 OT Time Calculation (min): 20 min Charges:  OT General Charges $OT Visit: 1 Visit OT Evaluation $OT Eval Low Complexity: [place], OT Acute Rehabilitation Services Pager: [phone] Office: [phone]   Hortencia Pilar 11/13/2018, 1:33 PM

## 2018-11-13 NOTE — Care Management Important Message (Signed)
Important Message  Patient Details  Name: [name] MRN: [id number] Date of Birth: 12-05-1931   Medicare Important Message Given:  Yes    Marcela Alatorre Montine Circle 11/13/2018, 4:06 PM

## 2018-11-13 NOTE — Anesthesia Postprocedure Evaluation (Signed)
Anesthesia Post Note  Patient: [name]  Procedure(s) Performed: INTRAMEDULLARY (IM) NAIL RIGHT INTERTROCHANTERIC HIP FRACTURE (Right Leg Upper)     Patient location during evaluation: PACU Anesthesia Type: General Level of consciousness: awake Pain management: pain level controlled Vital Signs Assessment: post-procedure vital signs reviewed and stable Respiratory status: spontaneous breathing Cardiovascular status: stable Postop Assessment: no apparent nausea or vomiting Anesthetic complications: no    Last Vitals:  Vitals:   11/13/18 0300 11/13/18 0751  BP: 114/62 124/60  Pulse: 86 72  Resp: 16 16  Temp: 37.1 C 36.6 C  SpO2: 98% 95%    Last Pain:  Vitals:   11/13/18 1000  TempSrc:   PainSc: 3                  Lynett Brasil

## 2018-11-13 NOTE — Progress Notes (Signed)
PROGRESS NOTE  [name] GNO:[id number] DOB: November 25, 1931 DOA: 11/11/2018 PCP: Rutherford Guys, MD  HPI/Recap of past 24 hours: [name] is a 85 y.o. male with past medical history of hypertension, hyperlipidemia, basal cell cancer, history of stroke, from home, presented to the hospital after sustaining a mechanical fall in his bathroom. Denies any dizziness, lightheadedness, chest pain, palpitation or diaphoresis prior to fall.  There is no mention of loss of consciousness.    X-ray of the right hip showed mildly displaced and possibly comminuted fracture involving basicervical and intertrochanteric fracture of the right hip.  Orthopedic surgery Dr. Erlinda Hong consulted and following.  Plan for surgical repair.   11/12/2018: Patient seen and examined at bedside.  No acute events overnight.  Pain is well controlled on current pain management.  He has no new complaints.  11/13/18: seen and examined at bedside. Denies pain at rest. No chest pain or dyspnea.   Assessment/Plan: Principal Problem:   Hip fracture (HCC) Active Problems:   Dyslipidemia   Benign essential HTN   History of CVA (cerebrovascular accident)   S/P right hip fracture  Right intertrochanteric hip fracture status post mechanical fall POD# 1. R hip repair done on 11/12/18 C/w pain management as needed C/w bowel movement while on pain meds Vital signs and labs reviewed and are stable PT recommends SNF CSW consulted for placement WBAT per orthopedic surgery  Leukocytosis Suspect reactive from recent surgery Wbc 13k from 11k afebrile No sign of infective process Monitor for fever and wbc Obtain CBC am  CKD 2 Baseline appears to be cr 1.1 with gfr>60 Cr today 1.30 with gfr 50 Start gentle IV fluid hydration U/O 350 cc recorded Monitor U/O Obtain BMP am  History of hyperlipidemia.  Continue pravastatin  History of hypertension.    BP at goal. C/w metoprolol.  PVCs.  No reccurence. Asymptomatic.  Continue  metoprolol.  Continue to closely monitor vital signs.  Eczema of the skin.  Patient takes prednisone as needed.  Will hold for now. Hold Fosamax.  History of CVA Resume ASA  Ambulatory dysfunction post right hip fracture PT recs SNF CSF consulted for placement to SNF  Consultant: Orthopedics Dr. Erlinda Hong   Code Status: DNR  DVT Prophylaxis: SQ Lovenox daily  Family Communication:  Daughters at bedside  Disposition Plan: SNF when bed placement     Objective: Vitals:   11/13/18 0001 11/13/18 0300 11/13/18 0751 11/13/18 1455  BP: 116/66 114/62 124/60 136/70  Pulse: 88 86 72 74  Resp: 16 16 16 18   Temp: 98.6 F (37 C) 98.8 F (37.1 C) 97.9 F (36.6 C) 98.2 F (36.8 C)  TempSrc: Oral Oral Oral Oral  SpO2: 100% 98% 95% 100%  Weight:      Height:        Intake/Output Summary (Last 24 hours) at 11/13/2018 1659 Last data filed at 11/13/2018 1300 Gross per 24 hour  Intake 1975 ml  Output 1075 ml  Net 900 ml   Filed Weights   11/11/18 2210 11/12/18 1331  Weight: 66.7 kg 66.7 kg    Exam:  . General: 85 y.o. year-old male well-developed well-nourished in no acute distress.  Alert and interactive.   . Cardiovascular: Regular rate and rhythm with no rubs or gallops.  No JVD or thyromegaly noted. Marland Kitchen Respiratory: Clear to auscultation with no wheezes or rales. Good Inspiratory Effort. . Abdomen: Soft nontender nondistended with normal bowel sounds x4 quadrants. . Musculoskeletal: No lower extremity edema. 2/4 pulses in  all 4 extremities.  R hip at site of surgical incision is covered with surgical adhesive and surrounding appears clean with no bruising or swelling . Psychiatry: Mood is appropriate for condition and setting   Data Reviewed: CBC: Recent Labs  Lab 11/11/18 1456 11/11/18 2225 11/12/18 0147 11/13/18 0539  WBC 16.5* 12.8* 11.6* 13.1*  NEUTROABS 14.0*  --   --   --   HGB 15.0 14.3 13.2 11.8*  HCT 46.0 42.6 39.0 34.6*  MCV 98.7 93.6 93.1 95.6  PLT 198  185 166 440*   Basic Metabolic Panel: Recent Labs  Lab 11/11/18 1456 11/11/18 2225 11/12/18 0147 11/13/18 0539  NA 138  --  136 136  K 4.0  --  3.7 4.4  CL 102  --  103 104  CO2 27  --  24 22  GLUCOSE 125*  --  120* 142*  BUN 17  --  12 16  CREATININE 1.22 1.14 1.11 1.30*  CALCIUM 8.9  --  8.6* 8.1*   GFR: Estimated Creatinine Clearance: 39.9 mL/min (A) (by C-G formula based on SCr of 1.3 mg/dL (H)). Liver Function Tests: No results for input(s): AST, ALT, ALKPHOS, BILITOT, PROT, ALBUMIN in the last 168 hours. No results for input(s): LIPASE, AMYLASE in the last 168 hours. No results for input(s): AMMONIA in the last 168 hours. Coagulation Profile: Recent Labs  Lab 11/11/18 1456 11/12/18 0147  INR 1.19 1.06   Cardiac Enzymes: No results for input(s): CKTOTAL, CKMB, CKMBINDEX, TROPONINI in the last 168 hours. BNP (last 3 results) No results for input(s): PROBNP in the last 8760 hours. HbA1C: No results for input(s): HGBA1C in the last 72 hours. CBG: No results for input(s): GLUCAP in the last 168 hours. Lipid Profile: No results for input(s): CHOL, HDL, LDLCALC, TRIG, CHOLHDL, LDLDIRECT in the last 72 hours. Thyroid Function Tests: No results for input(s): TSH, T4TOTAL, FREET4, T3FREE, THYROIDAB in the last 72 hours. Anemia Panel: No results for input(s): VITAMINB12, FOLATE, FERRITIN, TIBC, IRON, RETICCTPCT in the last 72 hours. Urine analysis:    Component Value Date/Time   COLORURINE YELLOW 01/17/2018 0327   APPEARANCEUR CLEAR 01/17/2018 0327   LABSPEC 1.021 01/17/2018 0327   PHURINE 5.0 01/17/2018 0327   GLUCOSEU NEGATIVE 01/17/2018 0327   HGBUR NEGATIVE 01/17/2018 0327   BILIRUBINUR NEGATIVE 01/17/2018 0327   BILIRUBINUR small (A) 01/16/2018 1551   BILIRUBINUR neg 06/03/2014 1014   KETONESUR 20 (A) 01/17/2018 0327   PROTEINUR NEGATIVE 01/17/2018 0327   UROBILINOGEN 4.0 (A) 01/16/2018 1551   NITRITE NEGATIVE 01/17/2018 0327   LEUKOCYTESUR NEGATIVE  01/17/2018 0327   Sepsis Labs: @LABRCNTIP (procalcitonin:4,lacticidven:4)  ) Recent Results (from the past 240 hour(s))  Surgical pcr screen     Status: Abnormal   Collection Time: 11/11/18 10:50 PM  Result Value Ref Range Status   MRSA, PCR NEGATIVE NEGATIVE Final   Staphylococcus aureus POSITIVE (A) NEGATIVE Final    Comment: (NOTE) The Xpert SA Assay (FDA approved for NASAL specimens in patients 53 years of age and older), is one component of a comprehensive surveillance program. It is not intended to diagnose infection nor to guide or monitor treatment. Performed at [facility], [place] [address] [place], [place] [address]       Studies: No results found.  Scheduled Meds: . Chlorhexidine Gluconate Cloth  6 each Topical Daily  . clobetasol ointment  1 application Topical BID  . docusate sodium  100 mg Oral BID  . enoxaparin (LOVENOX) injection  40  mg Subcutaneous Q24H  . metoprolol succinate  50 mg Oral Daily  . mupirocin ointment  1 application Nasal BID  . pravastatin  40 mg Oral Daily    Continuous Infusions: . sodium chloride Stopped (11/12/18 1537)  . sodium chloride 75 mL/hr at 11/12/18 1831  . methocarbamol (ROBAXIN) IV       LOS: 2 days     Kayleen Memos, MD [facility] Pager [phone]  If 7PM-7AM, please contact night-coverage [email or web address] Password [facility] 11/13/2018, 4:59 PM

## 2018-11-13 NOTE — Progress Notes (Signed)
Subjective: 1 Day Post-Op Procedure(s) (LRB): INTRAMEDULLARY (IM) NAIL RIGHT INTERTROCHANTERIC HIP FRACTURE (Right) Patient reports pain as mild.  Feeling great this am.   Objective: Vital signs in last 24 hours: Temp:  [97.2 F (36.2 C)-98.8 F (37.1 C)] 98.8 F (37.1 C) (01/17 0300) Pulse Rate:  [44-92] 86 (01/17 0300) Resp:  [15-26] 16 (01/17 0300) BP: (101-160)/(52-96) 114/62 (01/17 0300) SpO2:  [95 %-100 %] 98 % (01/17 0300) Weight:  [66.7 kg] 66.7 kg (01/16 1331)  Intake/Output from previous day: 01/16 0701 - 01/17 0700 In: 2440 [P.O.:720; I.V.:835; IV Piggyback:100] Out: 1175 [Urine:975; Blood:200] Intake/Output this shift: No intake/output data recorded.  Recent Labs    11/11/18 1456 11/11/18 2225 11/12/18 0147 11/13/18 0539  HGB 15.0 14.3 13.2 11.8*   Recent Labs    11/12/18 0147 11/13/18 0539  WBC 11.6* 13.1*  RBC 4.19* 3.62*  HCT 39.0 34.6*  PLT 166 144*   Recent Labs    11/12/18 0147 11/13/18 0539  NA 136 136  K 3.7 4.4  CL 103 104  CO2 24 22  BUN 12 16  CREATININE 1.11 1.30*  GLUCOSE 120* 142*  CALCIUM 8.6* 8.1*   Recent Labs    11/11/18 1456 11/12/18 0147  INR 1.19 1.06    Neurologically intact Neurovascular intact Sensation intact distally Intact pulses distally Dorsiflexion/Plantar flexion intact Incision: dressing C/D/I No cellulitis present Compartment soft    Assessment/Plan: 1 Day Post-Op Procedure(s) (LRB): INTRAMEDULLARY (IM) NAIL RIGHT INTERTROCHANTERIC HIP FRACTURE (Right) Up with therapy  WBAT RLE ABLA- mild and stable D/C dispo per medicine team F/u with Dr. Erlinda Hong 2 weeks post-op for staple removal   [name] 11/13/2018, 7:43 AM

## 2018-11-13 NOTE — Plan of Care (Signed)
  Problem: Activity: Goal: Ability to ambulate and perform ADLs will improve Outcome: Not Progressing   Problem: Clinical Measurements: Goal: Postoperative complications will be avoided or minimized Outcome: Progressing   Problem: Pain Management: Goal: Pain level will decrease Outcome: Progressing   Problem: Activity: Goal: Ability to ambulate and perform ADLs will improve Outcome: Not Progressing   Problem: Pain Management: Goal: Pain level will decrease Outcome: Progressing

## 2018-11-14 LAB — CBC
HCT: 31.4 % — ABNORMAL LOW (ref 39.0–52.0)
Hemoglobin: 10.9 g/dL — ABNORMAL LOW (ref 13.0–17.0)
MCH: 32.6 pg (ref 26.0–34.0)
MCHC: 34.7 g/dL (ref 30.0–36.0)
MCV: 94 fL (ref 80.0–100.0)
Platelets: 127 10*3/uL — ABNORMAL LOW (ref 150–400)
RBC: 3.34 MIL/uL — AB (ref 4.22–5.81)
RDW: 12.4 % (ref 11.5–15.5)
WBC: 12.2 10*3/uL — ABNORMAL HIGH (ref 4.0–10.5)
nRBC: 0 % (ref 0.0–0.2)

## 2018-11-14 LAB — BASIC METABOLIC PANEL
Anion gap: 10 (ref 5–15)
BUN: 15 mg/dL (ref 8–23)
CO2: 23 mmol/L (ref 22–32)
Calcium: 8.3 mg/dL — ABNORMAL LOW (ref 8.9–10.3)
Chloride: 105 mmol/L (ref 98–111)
Creatinine, Ser: 1.34 mg/dL — ABNORMAL HIGH (ref 0.61–1.24)
GFR calc Af Amer: 56 mL/min — ABNORMAL LOW (ref >60)
GFR calc non Af Amer: 48 mL/min — ABNORMAL LOW (ref >60)
GLUCOSE: 101 mg/dL — AB (ref 70–99)
Potassium: 4.3 mmol/L (ref 3.5–5.1)
Sodium: 138 mmol/L (ref 135–145)

## 2018-11-14 NOTE — Plan of Care (Signed)
  Problem: Education: Goal: Verbalization of understanding the information provided (i.e., activity precautions, restrictions, etc) will improve Outcome: Progressing   Problem: Pain Management: Goal: Pain level will decrease Outcome: Progressing   Problem: Safety: Goal: Ability to remain free from injury will improve Outcome: Progressing   Problem: Self-Concept: Goal: Ability to maintain and perform role responsibilities to the fullest extent possible will improve Outcome: Progressing   Problem: Activity: Goal: Ability to ambulate and perform ADLs will improve Outcome: Progressing

## 2018-11-14 NOTE — Progress Notes (Signed)
PROGRESS NOTE  [name] IRJ:[id number] DOB: 1932/03/26 DOA: 11/11/2018 PCP: Rutherford Guys, MD  HPI/Recap of past 24 hours: [name] is a 85 y.o. male with past medical history of hypertension, hyperlipidemia, basal cell cancer, history of stroke, from home, presented to the hospital after sustaining a mechanical fall in his bathroom. Denies any dizziness, lightheadedness, chest pain, palpitation or diaphoresis prior to fall.  There is no mention of loss of consciousness.    X-ray of the right hip showed mildly displaced and possibly comminuted fracture involving basicervical and intertrochanteric fracture of the right hip.  Orthopedic surgery Dr. Erlinda Hong consulted and following.  Plan for surgical repair.   11/14/18: POD #2 post right hip repair Seen and examined at his bedside.  No acute events overnight.  He has no new complaints.  Pain is well controlled.  PT assessed and recommended SNF.  Patient is agreeable.   Assessment/Plan: Principal Problem:   Hip fracture (HCC) Active Problems:   Dyslipidemia   Benign essential HTN   History of CVA (cerebrovascular accident)   S/P right hip fracture  Right intertrochanteric hip fracture status post mechanical fall POD# 2. R hip repair done on 11/12/18 C/w pain management as needed C/w bowel movement while on pain meds Vital signs and labs reviewed and are stable PT recommends SNF-patient is agreeable CSW assisting with SNF placement WBAT per orthopedic surgery  Resolving leukocytosis Suspect reactive from recent surgery Wbc 12 K from 13k from 11k Afebrile with no sign of acute infective process Repeat CBC in the morning  Chronic normocytic anemia versus acute blood loss anemia Slight hemoglobin dropped from 11.8-10.9 today No sign of overt bleeding Repeat CBC in the morning Vital signs stable  CKD 2 Baseline appears to be cr 1.1 with gfr>60 Cr today 1.34 from 1.30 with gfr 50 Continue gentle IV fluid hydration Last urine output  more than 1700 cc in last 24 hours Continue to monitor urine output Obtain BMP in the morning  History of hyperlipidemia.  Continue pravastatin  History of hypertension.    Blood pressure is at goal.  Continue metoprolol  PVCs.  No reccurence. Asymptomatic.  Continue metoprolol.  Continue to closely monitor vital signs.  Eczema of the skin.  Patient takes prednisone as needed.  Will hold for now. Hold Fosamax.  History of CVA Resume ASA  Ambulatory dysfunction post right hip fracture PT recs SNF CSF assisting for placement to SNF    Consultant: Orthopedics Dr. Erlinda Hong   Code Status: DNR  DVT Prophylaxis: SQ Lovenox daily  Family Communication:  Daughters at bedside  Disposition Plan: SNF when bed placement     Objective: Vitals:   11/13/18 1455 11/13/18 1941 11/14/18 0440 11/14/18 1512  BP: 136/70 114/74 130/76 110/64  Pulse: 74 91 85 89  Resp: 18 18 18 18   Temp: 98.2 F (36.8 C) (!) 97.4 F (36.3 C) 97.9 F (36.6 C) 98.3 F (36.8 C)  TempSrc: Oral Oral Oral Oral  SpO2: 100% 96% 94% 94%  Weight:      Height:        Intake/Output Summary (Last 24 hours) at 11/14/2018 1645 Last data filed at 11/14/2018 1300 Gross per 24 hour  Intake 840 ml  Output 1400 ml  Net -560 ml   Filed Weights   11/11/18 2210 11/12/18 1331  Weight: 66.7 kg 66.7 kg    Exam:  . General: 85 y.o. year-old male well-developed well-nourished in no acute distress.  Alert and oriented x3.   Marland Kitchen  Cardiovascular: Regular rate and rhythm with no rubs or gallops.  No JVD or thyromegaly noted. Marland Kitchen Respiratory: Clear to auscultation with no wheezes no rales.  Good inspiratory effort.   . Abdomen: Soft nontender nondistended with normal bowel sounds x4 quadrants. . Musculoskeletal: No lower extremity edema. 2/4 pulses in all 4 extremities.  R hip at site of surgical incision is covered with surgical adhesive and surrounding appears clean with no bruising or swelling . Psychiatry: Mood is  appropriate for condition and setting   Data Reviewed: CBC: Recent Labs  Lab 11/11/18 1456 11/11/18 2225 11/12/18 0147 11/13/18 0539 11/14/18 0352  WBC 16.5* 12.8* 11.6* 13.1* 12.2*  NEUTROABS 14.0*  --   --   --   --   HGB 15.0 14.3 13.2 11.8* 10.9*  HCT 46.0 42.6 39.0 34.6* 31.4*  MCV 98.7 93.6 93.1 95.6 94.0  PLT 198 185 166 144* 761*   Basic Metabolic Panel: Recent Labs  Lab 11/11/18 1456 11/11/18 2225 11/12/18 0147 11/13/18 0539 11/14/18 0352  NA 138  --  136 136 138  K 4.0  --  3.7 4.4 4.3  CL 102  --  103 104 105  CO2 27  --  24 22 23   GLUCOSE 125*  --  120* 142* 101*  BUN 17  --  12 16 15   CREATININE 1.22 1.14 1.11 1.30* 1.34*  CALCIUM 8.9  --  8.6* 8.1* 8.3*   GFR: Estimated Creatinine Clearance: 38.7 mL/min (A) (by C-G formula based on SCr of 1.34 mg/dL (H)). Liver Function Tests: No results for input(s): AST, ALT, ALKPHOS, BILITOT, PROT, ALBUMIN in the last 168 hours. No results for input(s): LIPASE, AMYLASE in the last 168 hours. No results for input(s): AMMONIA in the last 168 hours. Coagulation Profile: Recent Labs  Lab 11/11/18 1456 11/12/18 0147  INR 1.19 1.06   Cardiac Enzymes: No results for input(s): CKTOTAL, CKMB, CKMBINDEX, TROPONINI in the last 168 hours. BNP (last 3 results) No results for input(s): PROBNP in the last 8760 hours. HbA1C: No results for input(s): HGBA1C in the last 72 hours. CBG: No results for input(s): GLUCAP in the last 168 hours. Lipid Profile: No results for input(s): CHOL, HDL, LDLCALC, TRIG, CHOLHDL, LDLDIRECT in the last 72 hours. Thyroid Function Tests: No results for input(s): TSH, T4TOTAL, FREET4, T3FREE, THYROIDAB in the last 72 hours. Anemia Panel: No results for input(s): VITAMINB12, FOLATE, FERRITIN, TIBC, IRON, RETICCTPCT in the last 72 hours. Urine analysis:    Component Value Date/Time   COLORURINE YELLOW 01/17/2018 0327   APPEARANCEUR CLEAR 01/17/2018 0327   LABSPEC 1.021 01/17/2018 0327    PHURINE 5.0 01/17/2018 0327   GLUCOSEU NEGATIVE 01/17/2018 0327   HGBUR NEGATIVE 01/17/2018 0327   BILIRUBINUR NEGATIVE 01/17/2018 0327   BILIRUBINUR small (A) 01/16/2018 1551   BILIRUBINUR neg 06/03/2014 1014   KETONESUR 20 (A) 01/17/2018 0327   PROTEINUR NEGATIVE 01/17/2018 0327   UROBILINOGEN 4.0 (A) 01/16/2018 1551   NITRITE NEGATIVE 01/17/2018 0327   LEUKOCYTESUR NEGATIVE 01/17/2018 0327   Sepsis Labs: @LABRCNTIP (procalcitonin:4,lacticidven:4)  ) Recent Results (from the past 240 hour(s))  Surgical pcr screen     Status: Abnormal   Collection Time: 11/11/18 10:50 PM  Result Value Ref Range Status   MRSA, PCR NEGATIVE NEGATIVE Final   Staphylococcus aureus POSITIVE (A) NEGATIVE Final    Comment: (NOTE) The Xpert SA Assay (FDA approved for NASAL specimens in patients 82 years of age and older), is one component of a comprehensive surveillance program. It  is not intended to diagnose infection nor to guide or monitor treatment. Performed at [facility], [place] [address] [place], [place] [address]       Studies: No results found.  Scheduled Meds: . Chlorhexidine Gluconate Cloth  6 each Topical Daily  . clobetasol ointment  1 application Topical BID  . enoxaparin (LOVENOX) injection  40 mg Subcutaneous Q24H  . metoprolol succinate  50 mg Oral Daily  . mupirocin ointment  1 application Nasal BID  . pravastatin  40 mg Oral Daily  . senna-docusate  2 tablet Oral BID    Continuous Infusions: . sodium chloride Stopped (11/12/18 1537)  . methocarbamol (ROBAXIN) IV       LOS: 3 days     Kayleen Memos, MD [facility] Pager [phone]  If 7PM-7AM, please contact night-coverage [email or web address] Password [facility] 11/14/2018, 4:45 PM

## 2018-11-14 NOTE — Clinical Social Work Note (Signed)
Clinical Social Work Assessment  Patient Details  Name: [name] MRN: [id number] Date of Birth: 1932/05/13  Date of referral:  11/14/18               Reason for consult:  Facility Placement, Discharge Planning                Permission sought to share information with:  Facility Sport and exercise psychologist, Family Supports Permission granted to share information::  Yes, Verbal Permission Granted  Name::     [name] Gobin-Cumming  Agency::  SNFs  Relationship::  daughter  Contact Information:  [phone]  Housing/Transportation Living arrangements for the past 2 months:  Single Family Home Source of Information:  Patient Patient Interpreter Needed:  None Criminal Activity/Legal Involvement Pertinent to Current Situation/Hospitalization:  No - Comment as needed Significant Relationships:  Adult Children, Other Family Members Lives with:  Self Do you feel safe going back to the place where you live?  Yes Need for family participation in patient care:  No (Coment)  Care giving concerns: Patient from home. Has a part time aide/caregiver. PT recommending SNF.   Social Worker assessment / plan: CSW met with patient at bedside. Patient alert and oriented. CSW introduced self and role and discussed disposition planning - PT recommendation for SNF.  Patient reported he lives at home alone, but has a caregiver who comes to help with cooking and cleaning for a few hours in the morning every day except Sunday. Patient has been to rehab in the past and is agreeable to rehab again. Patient would like to return home after short term rehab. Patient gave permission to discuss with his daughter, [name], if needed.  CSW sent out initial SNF referrals, awaiting bed offers. Will provide bed offers and CMS SNF list when available. CSW to follow and support with discharge planning.  Employment status:  Retired Forensic scientist:  [employer] PT Recommendations:  [place] / Referral  to community resources:  [place]  Patient/Family's Response to care: Patient appreciative of care.  Patient/Family's Understanding of and Emotional Response to Diagnosis, Current Treatment, and Prognosis: Patient with understanding of his conditions and PT recommendation for SNF. Agreeable to rehab at SNF.  Emotional Assessment Appearance:  Appears stated age Attitude/Demeanor/Rapport:  Engaged Affect (typically observed):  Accepting, Appropriate, Calm, Pleasant Orientation:  Oriented to Self, Oriented to Place, Oriented to  Time, Oriented to Situation Alcohol / Substance use:  Not Applicable Psych involvement (Current and /or in the community):  No (Comment)  Discharge Needs  Concerns to be addressed:  Discharge Planning Concerns, Care Coordination Readmission within the last 30 days:  No Current discharge risk:  Physical Impairment Barriers to Discharge:  Continued Medical Work up   Estanislado Emms, LCSW 11/14/2018, 10:18 AM

## 2018-11-14 NOTE — NC FL2 (Signed)
[place] LEVEL OF CARE SCREENING TOOL     IDENTIFICATION  Patient Name: [name] Birthdate: Mar 01, 1932 Sex: male Admission Date (Current Location): 11/11/2018  [facility] and [place] Number:  Herbalist and Address:  [facility], [place] [address] [place], [place] [address]      Provider Number: [id number]  Attending Physician Name and Address:  Kayleen Memos, DO  Relative Name and Phone Number:  Ulis Kaps, daughter, [phone]    Current Level of Care: Hospital Recommended Level of Care: [place] Prior Approval Number:    Date Approved/Denied:   PASRR Number: [id number]  Discharge Plan: SNF    Current Diagnoses: Patient Active Problem List   Diagnosis Date Noted  . S/P right hip fracture 11/11/2018  . Community acquired pneumonia of right lower lobe of lung ([place]) 09/29/2018  . Recurrent falls 05/12/2018  . History of CVA (cerebrovascular accident) 01/16/2018  . Nocturnal enuresis   . Fall   . Full incontinence of feces   . Benign essential HTN   . Hypoalbuminemia due to protein-calorie malnutrition ([place])   . Abnormality of gait   . Leg edema   . Hip fracture ([place]) 03/30/2017  . Leukocytosis 03/29/2017  . Mild intermittent asthma with acute exacerbation 01/29/2017  . Chronic renal insufficiency 09/22/2015  . Tachycardia 09/22/2015  . PVC (premature ventricular contraction) 09/22/2015  . Osteoporosis 06/03/2014  . Basal cell carcinoma of face 06/03/2014  . Benign prostatic hyperplasia 05/27/2013  . Eczema 04/23/2012  . Dyslipidemia 04/23/2012    Orientation RESPIRATION BLADDER Height & Weight     Self, Time, Situation, Place  Normal Continent Weight: 66.7 kg Height:  5\' 10"  (177.8 cm)  BEHAVIORAL SYMPTOMS/MOOD NEUROLOGICAL BOWEL NUTRITION STATUS      Continent Diet(please see DC summary)  AMBULATORY STATUS COMMUNICATION OF NEEDS Skin   Extensive Assist Verbally Surgical  wounds(closed incisions R hip and R thigh)                       Personal Care Assistance Level of Assistance  Bathing, Feeding, Dressing Bathing Assistance: Limited assistance Feeding assistance: Independent Dressing Assistance: Limited assistance     Functional Limitations Info  Hearing, Sight, Speech Sight Info: Adequate Hearing Info: Adequate Speech Info: Adequate    SPECIAL CARE FACTORS FREQUENCY  PT (By licensed PT), OT (By licensed OT)     PT Frequency: 5x/week OT Frequency: 5x/week            Contractures Contractures Info: Not present    Additional Factors Info  Code Status, Allergies Code Status Info: DNR Allergies Info: Seasonal Ic (Cholestatin)           Current Medications (11/14/2018):  This is the current hospital active medication list Current Facility-Administered Medications  Medication Dose Route Frequency Provider Last Rate Last Dose  . 0.9 %  sodium chloride infusion   Intravenous Continuous [name], MD   Stopped at 11/12/18 1537  . acetaminophen (TYLENOL) tablet 325-650 mg  325-650 mg Oral Q6H PRN [name], MD   650 mg at 11/14/18 0911  . albuterol (PROVENTIL) (2.5 MG/3ML) 0.083% nebulizer solution 2.5 mg  2.5 mg Nebulization Q6H PRN [name], MD      . alum & mag hydroxide-simeth (MAALOX/MYLANTA) 200-200-20 MG/5ML suspension 30 mL  30 mL Oral Q4H PRN [name], MD      . Chlorhexidine Gluconate Cloth 2 % PADS 6 each  6 each Topical Daily [name], MD   6 each at 11/14/18 [phone]  . clobetasol ointment (TEMOVATE) 0.13 % 1 application  1 application Topical BID [name], MD   1 application at 14/38/88 0913  . enoxaparin (LOVENOX) injection 40 mg  40 mg Subcutaneous Q24H [name], MD   40 mg at 11/14/18 0912  . hydrocerin (EUCERIN) cream 1 application  1 application Topical PRN [name], MD      . HYDROcodone-acetaminophen (NORCO) 7.5-325 MG per tablet 1-2 tablet  1-2 tablet Oral Q4H PRN Kayleen Memos, DO    1 tablet at 11/13/18 1810   Or  . HYDROcodone-acetaminophen (NORCO/VICODIN) 5-325 MG per tablet 1-2 tablet  1-2 tablet Oral Q4H PRN Irene Pap N, DO      . magnesium citrate solution 1 Bottle  1 Bottle Oral Once PRN [name], MD      . menthol-cetylpyridinium (CEPACOL) lozenge 3 mg  1 lozenge Oral PRN [name], MD       Or  . phenol (CHLORASEPTIC) mouth spray 1 spray  1 spray Mouth/Throat PRN [name], MD      . methocarbamol (ROBAXIN) tablet 500 mg  500 mg Oral Q6H PRN [name], MD       Or  . methocarbamol (ROBAXIN) 500 mg in dextrose 5 % 50 mL IVPB  500 mg Intravenous Q6H PRN [name], MD      . metoprolol succinate (TOPROL-XL) 24 hr tablet 50 mg  50 mg Oral Daily [name], MD   50 mg at 11/14/18 0912  . morphine 2 MG/ML injection 1 mg  1 mg Intravenous Q2H PRN [name], MD      . mupirocin ointment (BACTROBAN) 2 % 1 application  1 application Nasal BID [name], MD   1 application at 75/79/72 0912  . ondansetron (ZOFRAN) tablet 4 mg  4 mg Oral Q6H PRN [name], MD       Or  . ondansetron [facility]) injection 4 mg  4 mg Intravenous Q6H PRN [name], MD      . polyethylene glycol (MIRALAX / GLYCOLAX) packet 17 g  17 g Oral Daily PRN [name], MD      . pravastatin (PRAVACHOL) tablet 40 mg  40 mg Oral Daily [name], MD   40 mg at 11/14/18 0912  . senna-docusate (Senokot-S) tablet 2 tablet  2 tablet Oral BID Irene Pap N, DO   2 tablet at 11/14/18 8206  . sorbitol 70 % solution 30 mL  30 mL Oral Daily PRN [name], MD         Discharge Medications: Please see discharge summary for a list of discharge medications.  Relevant Imaging Results:  Relevant Lab Results:   Additional Information SSN: [id number]  Estanislado Emms, LCSW

## 2018-11-14 NOTE — Plan of Care (Signed)
  Problem: Education: Goal: Verbalization of understanding the information provided (i.e., activity precautions, restrictions, etc) will improve Outcome: Progressing   Problem: Activity: Goal: Ability to ambulate and perform ADLs will improve Outcome: Progressing   Problem: Activity: Goal: Risk for activity intolerance will decrease Outcome: Progressing   Problem: Safety: Goal: Ability to remain free from injury will improve Outcome: Progressing   Problem: Skin Integrity: Goal: Risk for impaired skin integrity will decrease Outcome: Progressing

## 2018-11-15 LAB — BASIC METABOLIC PANEL
Anion gap: 8 (ref 5–15)
BUN: 13 mg/dL (ref 8–23)
CO2: 25 mmol/L (ref 22–32)
Calcium: 8.2 mg/dL — ABNORMAL LOW (ref 8.9–10.3)
Chloride: 103 mmol/L (ref 98–111)
Creatinine, Ser: 1.01 mg/dL (ref 0.61–1.24)
GFR calc non Af Amer: >60 mL/min (ref >60)
Glucose, Bld: 95 mg/dL (ref 70–99)
POTASSIUM: 4.1 mmol/L (ref 3.5–5.1)
Sodium: 136 mmol/L (ref 135–145)

## 2018-11-15 LAB — CBC
HEMATOCRIT: 30.3 % — AB (ref 39.0–52.0)
Hemoglobin: 10.2 g/dL — ABNORMAL LOW (ref 13.0–17.0)
MCH: 33.2 pg (ref 26.0–34.0)
MCHC: 33.7 g/dL (ref 30.0–36.0)
MCV: 98.7 fL (ref 80.0–100.0)
NRBC: 0 % (ref 0.0–0.2)
Platelets: 152 10*3/uL (ref 150–400)
RBC: 3.07 MIL/uL — ABNORMAL LOW (ref 4.22–5.81)
RDW: 12.4 % (ref 11.5–15.5)
WBC: 10.2 10*3/uL (ref 4.0–10.5)

## 2018-11-15 MED ORDER — ASPIRIN EC 81 MG PO TBEC
81.0000 mg | DELAYED_RELEASE_TABLET | Freq: Every day | ORAL | Status: DC
  Administered 2018-11-15 – 2018-11-17 (×3): 81 mg via ORAL
  Filled 2018-11-15 (×3): qty 1

## 2018-11-15 NOTE — Progress Notes (Signed)
PROGRESS NOTE  [name] CWC:[id number] DOB: June 04, 1932 DOA: 11/11/2018 PCP: Rutherford Guys, MD  HPI/Recap of past 24 hours: [name] is a 85 y.o. male with past medical history of hypertension, hyperlipidemia, basal cell cancer, history of stroke, from home, presented to the hospital after sustaining a mechanical fall in his bathroom. Denies any dizziness, lightheadedness, chest pain, palpitation or diaphoresis prior to fall.  There is no mention of loss of consciousness.    X-ray of the right hip showed mildly displaced and possibly comminuted fracture involving basicervical and intertrochanteric fracture of the right hip.  Orthopedic surgery Dr. Erlinda Hong consulted and following.  Plan for surgical repair.   11/14/18: POD #2 post right hip repair Seen and examined at his bedside.  No acute events overnight.  He has no new complaints.  Pain is well controlled.  PT assessed and recommended SNF.  Patient is agreeable.  11/15/2018: Patient seen and examined at bedside.  No acute events overnight.  Has no new complaints.  Pain is well controlled on current pain management.   Assessment/Plan: Principal Problem:   Hip fracture (HCC) Active Problems:   Dyslipidemia   Benign essential HTN   History of CVA (cerebrovascular accident)   S/P right hip fracture  Right intertrochanteric hip fracture status post mechanical fall POD# 3. R hip repair done on 11/12/18 C/w pain management as needed C/w bowel movement while on pain meds Vital signs and labs reviewed and are stable PT recommends SNF-patient is agreeable CSW assisting with SNF placement WBAT per orthopedic surgery  Resolved leukocytosis, suspect reactive from recent surgery  WBC 10.2 from 12 K from 13 K Afebrile with no sign of acute infective process Repeat CBC in the morning  Chronic normocytic anemia versus acute blood loss anemia Hemoglobin is stable No sign of overt bleeding Repeat CBC in the morning  AKI on CKD 2 Baseline  appears to be 1.0 with GFR greater than 60 Creatinine today 11/15/2018 was 1.01 Presented with creatinine of 1.34 Continue to avoid nephrotoxic agents/hypotension Continue to monitor urine output Repeat BMP in the morning  History of hyperlipidemia.  Continue pravastatin  History of hypertension.    Vital signs continue to be stable Blood pressure continues to be at goal.  Continue metoprolol.    PVCs.  No reccurence. Asymptomatic.  Continue metoprolol.  Continue to closely monitor vital signs.  Eczema of the skin.  Patient takes prednisone as needed.  Will hold for now. Hold Fosamax.  History of CVA Continue ASA and Pravachol  Ambulatory dysfunction post right hip fracture PT recs SNF CSF assisting for placement to SNF    Consultant: Orthopedics Dr. Erlinda Hong   Code Status: DNR  DVT Prophylaxis: SQ Lovenox daily  Family Communication:   None at bedside  Disposition Plan: SNF when bed placement     Objective: Vitals:   11/14/18 0440 11/14/18 1512 11/14/18 1951 11/15/18 0342  BP: 130/76 110/64 (!) 129/54 123/71  Pulse: 85 89 87 81  Resp: 18 18 14 14   Temp: 97.9 F (36.6 C) 98.3 F (36.8 C) 98.2 F (36.8 C) (!) 97.3 F (36.3 C)  TempSrc: Oral Oral Oral Oral  SpO2: 94% 94% 95% 95%  Weight:      Height:        Intake/Output Summary (Last 24 hours) at 11/15/2018 1215 Last data filed at 11/15/2018 0733 Gross per 24 hour  Intake 840 ml  Output 500 ml  Net 340 ml   Filed Weights   11/11/18  2210 11/12/18 1331  Weight: 66.7 kg 66.7 kg    Exam:  . General: 85 y.o. year-old male well developed well-nourished in no acute distress.  Alert and oriented x3.   . Cardiovascular: Regular rate and rhythm with no rubs or gallops.  No JVD or thyromegaly noted.   Marland Kitchen Respiratory: Clear to Auscultation with No Wheezes No Rales.  Good Inspiratory Effort.   . Abdomen: Soft nontender nondistended with normal bowel sounds x4 quadrants. . Musculoskeletal: No lower extremity  edema. 2/4 pulses in all 4 extremities.  R hip at site of surgical incision is covered with surgical adhesive and mild surrounding edema.  Psychiatry: Mood is appropriate for condition and setting   Data Reviewed: CBC: Recent Labs  Lab 11/11/18 1456 11/11/18 2225 11/12/18 0147 11/13/18 0539 11/14/18 0352 11/15/18 0612  WBC 16.5* 12.8* 11.6* 13.1* 12.2* 10.2  NEUTROABS 14.0*  --   --   --   --   --   HGB 15.0 14.3 13.2 11.8* 10.9* 10.2*  HCT 46.0 42.6 39.0 34.6* 31.4* 30.3*  MCV 98.7 93.6 93.1 95.6 94.0 98.7  PLT 198 185 166 144* 127* 035   Basic Metabolic Panel: Recent Labs  Lab 11/11/18 1456 11/11/18 2225 11/12/18 0147 11/13/18 0539 11/14/18 0352 11/15/18 0612  NA 138  --  136 136 138 136  K 4.0  --  3.7 4.4 4.3 4.1  CL 102  --  103 104 105 103  CO2 27  --  24 22 23 25   GLUCOSE 125*  --  120* 142* 101* 95  BUN 17  --  12 16 15 13   CREATININE 1.22 1.14 1.11 1.30* 1.34* 1.01  CALCIUM 8.9  --  8.6* 8.1* 8.3* 8.2*   GFR: Estimated Creatinine Clearance: 51.4 mL/min (by C-G formula based on SCr of 1.01 mg/dL). Liver Function Tests: No results for input(s): AST, ALT, ALKPHOS, BILITOT, PROT, ALBUMIN in the last 168 hours. No results for input(s): LIPASE, AMYLASE in the last 168 hours. No results for input(s): AMMONIA in the last 168 hours. Coagulation Profile: Recent Labs  Lab 11/11/18 1456 11/12/18 0147  INR 1.19 1.06   Cardiac Enzymes: No results for input(s): CKTOTAL, CKMB, CKMBINDEX, TROPONINI in the last 168 hours. BNP (last 3 results) No results for input(s): PROBNP in the last 8760 hours. HbA1C: No results for input(s): HGBA1C in the last 72 hours. CBG: No results for input(s): GLUCAP in the last 168 hours. Lipid Profile: No results for input(s): CHOL, HDL, LDLCALC, TRIG, CHOLHDL, LDLDIRECT in the last 72 hours. Thyroid Function Tests: No results for input(s): TSH, T4TOTAL, FREET4, T3FREE, THYROIDAB in the last 72 hours. Anemia Panel: No results for  input(s): VITAMINB12, FOLATE, FERRITIN, TIBC, IRON, RETICCTPCT in the last 72 hours. Urine analysis:    Component Value Date/Time   COLORURINE YELLOW 01/17/2018 0327   APPEARANCEUR CLEAR 01/17/2018 0327   LABSPEC 1.021 01/17/2018 0327   PHURINE 5.0 01/17/2018 0327   GLUCOSEU NEGATIVE 01/17/2018 0327   HGBUR NEGATIVE 01/17/2018 0327   BILIRUBINUR NEGATIVE 01/17/2018 0327   BILIRUBINUR small (A) 01/16/2018 1551   BILIRUBINUR neg 06/03/2014 1014   KETONESUR 20 (A) 01/17/2018 0327   PROTEINUR NEGATIVE 01/17/2018 0327   UROBILINOGEN 4.0 (A) 01/16/2018 1551   NITRITE NEGATIVE 01/17/2018 0327   LEUKOCYTESUR NEGATIVE 01/17/2018 0327   Sepsis Labs: @LABRCNTIP (procalcitonin:4,lacticidven:4)  ) Recent Results (from the past 240 hour(s))  Surgical pcr screen     Status: Abnormal   Collection Time: 11/11/18 10:50 PM  Result  Value Ref Range Status   MRSA, PCR NEGATIVE NEGATIVE Final   Staphylococcus aureus POSITIVE (A) NEGATIVE Final    Comment: (NOTE) The Xpert SA Assay (FDA approved for NASAL specimens in patients 75 years of age and older), is one component of a comprehensive surveillance program. It is not intended to diagnose infection nor to guide or monitor treatment. Performed at [facility], [place] [address], [place], [place] [address]       Studies: No results found.  Scheduled Meds: . Chlorhexidine Gluconate Cloth  6 each Topical Daily  . clobetasol ointment  1 application Topical BID  . enoxaparin (LOVENOX) injection  40 mg Subcutaneous Q24H  . metoprolol succinate  50 mg Oral Daily  . mupirocin ointment  1 application Nasal BID  . pravastatin  40 mg Oral Daily  . senna-docusate  2 tablet Oral BID    Continuous Infusions: . sodium chloride Stopped (11/12/18 1537)  . methocarbamol (ROBAXIN) IV       LOS: 4 days     Kayleen Memos, MD [facility] Pager [phone]  If 7PM-7AM, please contact night-coverage [email or web address] Password  [facility] 11/15/2018, 12:15 PM

## 2018-11-16 ENCOUNTER — Inpatient Hospital Stay (HOSPITAL_COMMUNITY): Payer: Medicare Other

## 2018-11-16 LAB — CBC
HCT: 29.6 % — ABNORMAL LOW (ref 39.0–52.0)
Hemoglobin: 10.1 g/dL — ABNORMAL LOW (ref 13.0–17.0)
MCH: 32.7 pg (ref 26.0–34.0)
MCHC: 34.1 g/dL (ref 30.0–36.0)
MCV: 95.8 fL (ref 80.0–100.0)
Platelets: 172 10*3/uL (ref 150–400)
RBC: 3.09 MIL/uL — ABNORMAL LOW (ref 4.22–5.81)
RDW: 12.4 % (ref 11.5–15.5)
WBC: 8.8 10*3/uL (ref 4.0–10.5)
nRBC: 0 % (ref 0.0–0.2)

## 2018-11-16 LAB — BASIC METABOLIC PANEL
Anion gap: 8 (ref 5–15)
BUN: 12 mg/dL (ref 8–23)
CO2: 27 mmol/L (ref 22–32)
CREATININE: 1.1 mg/dL (ref 0.61–1.24)
Calcium: 8.4 mg/dL — ABNORMAL LOW (ref 8.9–10.3)
Chloride: 102 mmol/L (ref 98–111)
GFR calc Af Amer: >60 mL/min (ref >60)
GFR calc non Af Amer: >60 mL/min (ref >60)
Glucose, Bld: 102 mg/dL — ABNORMAL HIGH (ref 70–99)
Potassium: 4.3 mmol/L (ref 3.5–5.1)
Sodium: 137 mmol/L (ref 135–145)

## 2018-11-16 MED ORDER — ENSURE ENLIVE PO LIQD
237.0000 mL | Freq: Two times a day (BID) | ORAL | Status: DC
  Administered 2018-11-16 – 2018-11-17 (×3): 237 mL via ORAL

## 2018-11-16 MED ORDER — LACTATED RINGERS IV SOLN
INTRAVENOUS | Status: AC
  Administered 2018-11-16: 22:00:00 via INTRAVENOUS

## 2018-11-16 NOTE — Plan of Care (Signed)
  Problem: Education: Goal: Verbalization of understanding the information provided (i.e., activity precautions, restrictions, etc) will improve Outcome: Progressing   Problem: Activity: Goal: Ability to ambulate and perform ADLs will improve Outcome: Progressing   

## 2018-11-16 NOTE — Progress Notes (Signed)
Physical Therapy Treatment Patient Details Name: [name] MRN: [id number] DOB: October 18, 1932 Today's Date: 11/16/2018    History of Present Illness Patient is an 85 y/o male presenting to the ED on 11/11/18 with R hip pain s/p fall at home. Xray revealing R hip fracture. Now s/p R hip IM nail on 11/12/18 with patient to be WBAT. PMH significant for hypertension, hyperlipidemia, basal cell cancer, history of stroke.    PT Comments    Patient seen for mobility progression. Pt is able to stand pivot with max A +2 and max cues with RW. Limited by pain and anxiety with mobility. Family present. Continue to progress as tolerated with anticipated d/c to SNF for further skilled PT services.      Follow Up Recommendations  SNF     Equipment Recommendations  Other (comment)(defer)    Recommendations for Other Services       Precautions / Restrictions Precautions Precautions: Fall Restrictions Weight Bearing Restrictions: Yes RLE Weight Bearing: Weight bearing as tolerated    Mobility  Bed Mobility Overal bed mobility: Needs Assistance Bed Mobility: Supine to Sit     Supine to sit: Mod assist;HOB elevated     General bed mobility comments: assist to bring R LE and hips to EOB ;cues for sequencing and technique; use of rail and HOB elevated  Transfers Overall transfer level: Needs assistance Equipment used: Rolling walker (2 wheeled) Transfers: Sit to/from [facility] Sit to Stand: From elevated surface;Max assist;+2 physical assistance Stand pivot transfers: Max assist;+2 physical assistance       General transfer comment: max multimodal cues for sequencing and assistance required to guide RW, maintain balance, and move R foot during pivot; pt able to pivot L foot; fatigued during transfer and required guidane of hips to recliner  Ambulation/Gait             General Gait Details: unable to progress to ambulation   Stairs             Wheelchair  Mobility    Modified Rankin (Stroke Patients Only)       Balance Overall balance assessment: Needs assistance Sitting-balance support: Feet supported;Single extremity supported Sitting balance-Leahy Scale: Fair     Standing balance support: Bilateral upper extremity supported;During functional activity Standing balance-Leahy Scale: Poor                              Cognition Arousal/Alertness: Awake/alert Behavior During Therapy: WFL for tasks assessed/performed;Anxious Overall Cognitive Status: Within Functional Limits for tasks assessed                                 General Comments: fear of falling       Exercises      General Comments  SpO2 93-96% on RA       Pertinent Vitals/Pain Pain Assessment: Faces Faces Pain Scale: Hurts whole lot Pain Location: R hip with transitional movements and weight bearing Pain Descriptors / Indicators: Sore;Grimacing;Moaning;Guarding Pain Intervention(s): Limited activity within patient's tolerance;Monitored during session;Repositioned    Home Living                      Prior Function            PT Goals (current goals can now be found in the care plan section) Progress towards PT goals: Progressing toward goals  Frequency    Min 3X/week      PT Plan Current plan remains appropriate    Co-evaluation              AM-PAC PT "6 Clicks" Mobility   Outcome Measure  Help needed turning from your back to your side while in a flat bed without using bedrails?: A Lot Help needed moving from lying on your back to sitting on the side of a flat bed without using bedrails?: A Lot Help needed moving to and from a bed to a chair (including a wheelchair)?: A Lot Help needed standing up from a chair using your arms (e.g., wheelchair or bedside chair)?: A Lot Help needed to walk in hospital room?: Total Help needed climbing 3-5 steps with a railing? : Total 6 Click Score: 10    End  of Session Equipment Utilized During Treatment: Gait belt Activity Tolerance: Patient limited by pain Patient left: in chair;with call bell/phone within reach;with chair alarm set;with family/visitor present Nurse Communication: Mobility status; use Stedy for transfers PT Visit Diagnosis: Unsteadiness on feet (R26.81);Other abnormalities of gait and mobility (R26.89);Muscle weakness (generalized) (M62.81);History of falling (Z91.81)     Time: 9476-5465 PT Time Calculation (min) (ACUTE ONLY): 38 min  Charges:  $Gait Training: 38-52 mins                     Earney Navy, PTA Acute Rehabilitation Services Pager: [phone] Office: [phone]     Darliss Cheney 11/16/2018, 12:53 PM

## 2018-11-16 NOTE — Progress Notes (Signed)
Family preferred [facility] for Skilled Nursing. [facility] has bed avail tomorrow for patient.   Patient will discharge to [facility] tomorrow morning.   [place], [place]

## 2018-11-16 NOTE — Progress Notes (Signed)
PROGRESS NOTE  Clifton Safley VZC:[id number] DOB: 1932/05/07 DOA: 11/11/2018 PCP: Rutherford Guys, MD  HPI/Recap of past 24 hours: Giorgio Chabot is a 85 y.o. male with past medical history of hypertension, hyperlipidemia, basal cell carcinoma of face, history of stroke, presented from home to the hospital after sustaining a mechanical fall in his bathroom. Denies any dizziness, lightheadedness, chest pain, palpitation or diaphoresis prior to fall.  X-ray of the right hip showed mildly displaced and possibly comminuted fracture involving basicervical and intertrochanteric fracture of the right hip.  Orthopedic surgery Dr. Erlinda Hong consulted and completed surgical repair.   11/16/2018: POD #4 post right hip repair.  Patient seen and examined at bedside.  Son and daughter present.  Daughter concerned about patient having periods of confusion, states similar presentation with previous stroke.  CT head without contrast ordered to further assess.   Assessment/Plan: Principal Problem:   Hip fracture (HCC) Active Problems:   Dyslipidemia   Benign essential HTN   History of CVA (cerebrovascular accident)   S/P right hip fracture  Right intertrochanteric hip fracture status post mechanical fall POD# 4.  R hip repair done on 11/12/18 C/w pain management as needed C/w bowel movement while on pain meds Vital signs and labs reviewed and are stable PT recommends SNF-patient is agreeable CSW assisting with SNF placement WBAT per orthopedic surgery  Acute metabolic encephalopathy suspect delirium versus others in the setting of previous stroke Per daughter patient is confused and behavior is similar to previous stroke Obtain CT head with no contrast. Patient is on aspirin 81 mg daily and on Pravachol 40 mg daily He is in sinus rhythm Continue to closely monitor  Resolved leukocytosis, suspect reactive from recent surgery  WBC 8.8 from 10.2 from 12 K from 13 K Afebrile with no sign of acute infective  process CBC in the morning  Acute blood loss anemia status post right hip repair HemoGlobin is stable No sign of overt bleeding Repeat CBC in the morning  AKI on CKD 2 Baseline appears to be 1.0 with GFR greater than 60 Creatinine today 11/16/2018 was 1.10 Presented with creatinine of 1.34 Start gentle IV fluid hydration lactated Ringer at 50 cc/h Continue to avoid nephrotoxic agents/hypotension Repeat BMP in the morning  History of hyperlipidemia.  Continue pravastatin  History of hypertension.    Vital signs continue to be stable Blood pressure continues to be at goal.  Continue metoprolol.    PVCs.  No reccurence. Asymptomatic.  Continue metoprolol.  Continue to closely monitor vital signs.  Eczema of the skin.  Patient takes prednisone as needed.  Will hold for now. Hold Fosamax.  History of CVA Continue ASA and Pravachol  Ambulatory dysfunction post right hip fracture PT recs SNF CSF assisting for placement to SNF    Consultant: Orthopedics Dr. Erlinda Hong   Code Status: DNR  DVT Prophylaxis: SQ Lovenox daily  Family Communication:   Son and daughter at bedside.  All questions answered to their satisfaction.  Disposition Plan: SNF when bed is available     Objective: Vitals:   11/15/18 1300 11/15/18 1931 11/16/18 0427 11/16/18 0912  BP: 122/77 128/64 127/71 108/72  Pulse: 79 83 77 73  Resp: 15     Temp: 98.1 F (36.7 C) (!) 97.5 F (36.4 C) 97.6 F (36.4 C)   TempSrc: Oral Oral Axillary   SpO2: 95% 96% 97%   Weight:      Height:        Intake/Output Summary (Last 24  hours) at 11/16/2018 1417 Last data filed at 11/16/2018 1100 Gross per 24 hour  Intake 240 ml  Output 200 ml  Net 40 ml   Filed Weights   11/11/18 2210 11/12/18 1331  Weight: 66.7 kg 66.7 kg    Exam:  . General: 85 y.o. year-old male Well developed well-nourished in no acute distress.  Alert and interactive. . Cardiovascular: Regular rate and rhythm with no rubs or gallops.   No JVD or thyromegaly noted. Marland Kitchen Respiratory: Clear to auscultation with no wheezes or rales.  Good inspiratory effort.   . Abdomen: Soft nontender nondistended with normal bowel sounds x4 quadrants. . Musculoskeletal: No lower extremity edema. 2/4 pulses in all 4 extremities.  R hip at site of surgical incision is covered with surgical adhesive and mild surrounding edema.   Marland Kitchen Psychiatry: Mood is appropriate for condition and setting   Data Reviewed: CBC: Recent Labs  Lab 11/11/18 1456  11/12/18 0147 11/13/18 0539 11/14/18 0352 11/15/18 0612 11/16/18 0413  WBC 16.5*   < > 11.6* 13.1* 12.2* 10.2 8.8  NEUTROABS 14.0*  --   --   --   --   --   --   HGB 15.0   < > 13.2 11.8* 10.9* 10.2* 10.1*  HCT 46.0   < > 39.0 34.6* 31.4* 30.3* 29.6*  MCV 98.7   < > 93.1 95.6 94.0 98.7 95.8  PLT 198   < > 166 144* 127* 152 172   < > = values in this interval not displayed.   Basic Metabolic Panel: Recent Labs  Lab 11/12/18 0147 11/13/18 0539 11/14/18 0352 11/15/18 0612 11/16/18 0413  NA 136 136 138 136 137  K 3.7 4.4 4.3 4.1 4.3  CL 103 104 105 103 102  CO2 24 22 23 25 27   GLUCOSE 120* 142* 101* 95 102*  BUN 12 16 15 13 12   CREATININE 1.11 1.30* 1.34* 1.01 1.10  CALCIUM 8.6* 8.1* 8.3* 8.2* 8.4*   GFR: Estimated Creatinine Clearance: 47.2 mL/min (by C-G formula based on SCr of 1.1 mg/dL). Liver Function Tests: No results for input(s): AST, ALT, ALKPHOS, BILITOT, PROT, ALBUMIN in the last 168 hours. No results for input(s): LIPASE, AMYLASE in the last 168 hours. No results for input(s): AMMONIA in the last 168 hours. Coagulation Profile: Recent Labs  Lab 11/11/18 1456 11/12/18 0147  INR 1.19 1.06   Cardiac Enzymes: No results for input(s): CKTOTAL, CKMB, CKMBINDEX, TROPONINI in the last 168 hours. BNP (last 3 results) No results for input(s): PROBNP in the last 8760 hours. HbA1C: No results for input(s): HGBA1C in the last 72 hours. CBG: No results for input(s): GLUCAP in the  last 168 hours. Lipid Profile: No results for input(s): CHOL, HDL, LDLCALC, TRIG, CHOLHDL, LDLDIRECT in the last 72 hours. Thyroid Function Tests: No results for input(s): TSH, T4TOTAL, FREET4, T3FREE, THYROIDAB in the last 72 hours. Anemia Panel: No results for input(s): VITAMINB12, FOLATE, FERRITIN, TIBC, IRON, RETICCTPCT in the last 72 hours. Urine analysis:    Component Value Date/Time   COLORURINE YELLOW 01/17/2018 0327   APPEARANCEUR CLEAR 01/17/2018 0327   LABSPEC 1.021 01/17/2018 0327   PHURINE 5.0 01/17/2018 0327   GLUCOSEU NEGATIVE 01/17/2018 0327   HGBUR NEGATIVE 01/17/2018 0327   BILIRUBINUR NEGATIVE 01/17/2018 0327   BILIRUBINUR small (A) 01/16/2018 1551   BILIRUBINUR neg 06/03/2014 1014   KETONESUR 20 (A) 01/17/2018 0327   PROTEINUR NEGATIVE 01/17/2018 0327   UROBILINOGEN 4.0 (A) 01/16/2018 1551   NITRITE NEGATIVE  01/17/2018 0327   LEUKOCYTESUR NEGATIVE 01/17/2018 0327   Sepsis Labs: @LABRCNTIP (procalcitonin:4,lacticidven:4)  ) Recent Results (from the past 240 hour(s))  Surgical pcr screen     Status: Abnormal   Collection Time: 11/11/18 10:50 PM  Result Value Ref Range Status   MRSA, PCR NEGATIVE NEGATIVE Final   Staphylococcus aureus POSITIVE (A) NEGATIVE Final    Comment: (NOTE) The Xpert SA Assay (FDA approved for NASAL specimens in patients 40 years of age and older), is one component of a comprehensive surveillance program. It is not intended to diagnose infection nor to guide or monitor treatment. Performed at [facility], [place] [address], [place], [place] [address]       Studies: No results found.  Scheduled Meds: . aspirin EC  81 mg Oral Daily  . clobetasol ointment  1 application Topical BID  . enoxaparin (LOVENOX) injection  40 mg Subcutaneous Q24H  . feeding supplement (ENSURE ENLIVE)  237 mL Oral BID BM  . metoprolol succinate  50 mg Oral Daily  . mupirocin ointment  1 application Nasal BID  . pravastatin  40 mg Oral Daily  .  senna-docusate  2 tablet Oral BID    Continuous Infusions: . methocarbamol (ROBAXIN) IV       LOS: 5 days     Kayleen Memos, MD [facility] Pager [phone]  If 7PM-7AM, please contact night-coverage [email or web address] Password [email or web address] 11/16/2018, 2:17 PM

## 2018-11-17 DIAGNOSIS — M2550 Pain in unspecified joint: Secondary | ICD-10-CM | POA: Diagnosis not present

## 2018-11-17 DIAGNOSIS — S72141D Displaced intertrochanteric fracture of right femur, subsequent encounter for closed fracture with routine healing: Secondary | ICD-10-CM | POA: Diagnosis not present

## 2018-11-17 DIAGNOSIS — I10 Essential (primary) hypertension: Secondary | ICD-10-CM | POA: Diagnosis not present

## 2018-11-17 DIAGNOSIS — Z7401 Bed confinement status: Secondary | ICD-10-CM | POA: Diagnosis not present

## 2018-11-17 DIAGNOSIS — S72001A Fracture of unspecified part of neck of right femur, initial encounter for closed fracture: Secondary | ICD-10-CM | POA: Diagnosis not present

## 2018-11-17 DIAGNOSIS — R2689 Other abnormalities of gait and mobility: Secondary | ICD-10-CM | POA: Diagnosis not present

## 2018-11-17 DIAGNOSIS — E440 Moderate protein-calorie malnutrition: Secondary | ICD-10-CM | POA: Diagnosis not present

## 2018-11-17 DIAGNOSIS — R1312 Dysphagia, oropharyngeal phase: Secondary | ICD-10-CM | POA: Diagnosis not present

## 2018-11-17 DIAGNOSIS — I679 Cerebrovascular disease, unspecified: Secondary | ICD-10-CM | POA: Diagnosis not present

## 2018-11-17 DIAGNOSIS — J4520 Mild intermittent asthma, uncomplicated: Secondary | ICD-10-CM | POA: Diagnosis not present

## 2018-11-17 DIAGNOSIS — M810 Age-related osteoporosis without current pathological fracture: Secondary | ICD-10-CM | POA: Diagnosis not present

## 2018-11-17 DIAGNOSIS — M6281 Muscle weakness (generalized): Secondary | ICD-10-CM | POA: Diagnosis not present

## 2018-11-17 DIAGNOSIS — Z8673 Personal history of transient ischemic attack (TIA), and cerebral infarction without residual deficits: Secondary | ICD-10-CM | POA: Diagnosis not present

## 2018-11-17 DIAGNOSIS — E785 Hyperlipidemia, unspecified: Secondary | ICD-10-CM | POA: Diagnosis not present

## 2018-11-17 DIAGNOSIS — D62 Acute posthemorrhagic anemia: Secondary | ICD-10-CM | POA: Diagnosis not present

## 2018-11-17 DIAGNOSIS — R531 Weakness: Secondary | ICD-10-CM | POA: Diagnosis not present

## 2018-11-17 DIAGNOSIS — M79604 Pain in right leg: Secondary | ICD-10-CM | POA: Diagnosis not present

## 2018-11-17 DIAGNOSIS — Z85828 Personal history of other malignant neoplasm of skin: Secondary | ICD-10-CM | POA: Diagnosis not present

## 2018-11-17 DIAGNOSIS — Z9181 History of falling: Secondary | ICD-10-CM | POA: Diagnosis not present

## 2018-11-17 DIAGNOSIS — M62838 Other muscle spasm: Secondary | ICD-10-CM | POA: Diagnosis not present

## 2018-11-17 DIAGNOSIS — R2681 Unsteadiness on feet: Secondary | ICD-10-CM | POA: Diagnosis not present

## 2018-11-17 DIAGNOSIS — M25551 Pain in right hip: Secondary | ICD-10-CM | POA: Diagnosis not present

## 2018-11-17 DIAGNOSIS — L309 Dermatitis, unspecified: Secondary | ICD-10-CM | POA: Diagnosis not present

## 2018-11-17 LAB — BASIC METABOLIC PANEL
Anion gap: 7 (ref 5–15)
BUN: 16 mg/dL (ref 8–23)
CO2: 25 mmol/L (ref 22–32)
Calcium: 8.4 mg/dL — ABNORMAL LOW (ref 8.9–10.3)
Chloride: 104 mmol/L (ref 98–111)
Creatinine, Ser: 1.05 mg/dL (ref 0.61–1.24)
GFR calc Af Amer: >60 mL/min (ref >60)
Glucose, Bld: 101 mg/dL — ABNORMAL HIGH (ref 70–99)
Potassium: 4.1 mmol/L (ref 3.5–5.1)
Sodium: 136 mmol/L (ref 135–145)

## 2018-11-17 LAB — CBC
HCT: 29.3 % — ABNORMAL LOW (ref 39.0–52.0)
Hemoglobin: 9.7 g/dL — ABNORMAL LOW (ref 13.0–17.0)
MCH: 31.4 pg (ref 26.0–34.0)
MCHC: 33.1 g/dL (ref 30.0–36.0)
MCV: 94.8 fL (ref 80.0–100.0)
Platelets: 175 10*3/uL (ref 150–400)
RBC: 3.09 MIL/uL — ABNORMAL LOW (ref 4.22–5.81)
RDW: 12.3 % (ref 11.5–15.5)
WBC: 8.1 10*3/uL (ref 4.0–10.5)
nRBC: 0 % (ref 0.0–0.2)

## 2018-11-17 MED ORDER — ENSURE ENLIVE PO LIQD: 237.0000 mL | Freq: Two times a day (BID) | ORAL | 12 refills | Status: DC

## 2018-11-17 MED ORDER — SENNOSIDES-DOCUSATE SODIUM 8.6-50 MG PO TABS: 2.0000 | ORAL_TABLET | Freq: Two times a day (BID) | ORAL | 0 refills | Status: DC

## 2018-11-17 MED ORDER — POLYETHYLENE GLYCOL 3350 17 G PO PACK: 17.0000 g | PACK | Freq: Every day | ORAL | 0 refills | Status: DC | PRN

## 2018-11-17 NOTE — Progress Notes (Signed)
EMS arrived. Given oral and written report, scripts, and discharge packet. Pt daughter at bedside.

## 2018-11-17 NOTE — Progress Notes (Signed)
Report called to nurse Linton Flemings at receiving facility. Pt and daughter given oral report. Scripts for [employer] and lovenox, DNR form, and AVS in discharge pt for facility review. Awaiting [facility] arrival.

## 2018-11-17 NOTE — Discharge Summary (Signed)
Physician Discharge Summary  Patient ID: [name] MRN: [id number] DOB/AGE: [birth year] 85 y.o.  Admit date: 11/11/2018 Discharge date: 11/17/2018  Admission Diagnoses:  Discharge Diagnoses:  Principal Problem:   Hip fracture [facility]) Active Problems:   Dyslipidemia   Benign essential HTN   History of CVA (cerebrovascular accident)   S/P right hip fracture   Discharged Condition: stable  Hospital Course: [name] an 85 year old Caucasian male with past medical history significant for hypertension, hyperlipidemia, basal cell carcinoma of face and history of stroke.  Patient was presented with comminuted right hip fracture following a fall at home.  Patient was admitted for further assessment and management.  Orthopedic team was consulted to assist in directing patient's management.  Patient underwent intramedullary implant on 11/12/2018 for the right intertrochanteric fracture.  Postop management was directed by the orthopedic team.  Patient has been cleared for discharge.  Patient will follow with primary care provider and orthopedic team on discharge.  Right intertrochanteric hip fracture: Patient underwent intramedullary implant on 11/12/2018.   Orthopedic team directed postop care. Pain control has been optimized. Patient has been cleared for discharge by the orthopedic team. Patient be discharged to skilled nursing facility for subacute rehabilitation.  Acute encephalopathy, suspect metabolic:  CT head revealed old stable infarct. Resolved significantly Continue to monitor closely.  Acute blood loss anemia status post right hip repair: Hemoglobin is stable Hemoglobin prior to discharge was 9.7 g/dL.  History of hyperlipidemia: Continue pravastatin  History of hypertension: Optimized  History of CVA Continue ASA and Pravachol  Ambulatory dysfunction post right hip fracture Skilled nursing facility placement with subacute rehabilitation.  Consults:  orthopedic surgery  Significant Diagnostic Studies:    Discharge Exam: Blood pressure (!) 119/59, pulse 81, temperature 99.6 F (37.6 C), temperature source Oral, resp. rate 20, height 5\' 10"  (1.778 m), weight 66.7 kg, SpO2 97 %.   Disposition: Discharge disposition: 03-Skilled Nursing Facility   Discharge Instructions    Diet - low sodium heart healthy   Complete by:  As directed    Increase activity slowly   Complete by:  As directed    Weight bearing as tolerated   Complete by:  As directed      Allergies as of 11/17/2018      Reactions   Seasonal Ic [cholestatin]       Medication List    STOP taking these medications   predniSONE 20 MG tablet Commonly known as:  DELTASONE   triamcinolone 0.1 % cream : eucerin Crea     TAKE these medications   albuterol 108 (90 Base) MCG/ACT inhaler Commonly known as:  PROVENTIL HFA;VENTOLIN HFA Inhale 2 puffs into the lungs every 6 (six) hours as needed for wheezing or shortness of breath.   alendronate 70 MG tablet Commonly known as:  FOSAMAX Take 1 tablet (70 mg total) by mouth every 7 (seven) days. Take with a full glass of water on an empty stomach.   aspirin 81 MG EC tablet Take 1 tablet (81 mg total) by mouth daily.   clobetasol ointment 0.05 % Commonly known as:  TEMOVATE Apply 1 application topically 2 (two) times daily.   enoxaparin 40 MG/0.4ML injection Commonly known as:  LOVENOX Inject 0.4 mLs (40 mg total) into the skin daily.   eucerin cream Apply 1 application topically as needed for dry skin.   feeding supplement (ENSURE ENLIVE) Liqd Take 237 mLs by mouth 2 (two) times daily between meals. Start taking on:  November 18, 2018  metoprolol succinate 50 MG 24 hr tablet Commonly known as:  TOPROL-XL Take 1 tablet (50 mg total) by mouth daily. Take with or immediately following a meal.   oxyCODONE-acetaminophen 5-325 MG tablet Commonly known as:  PERCOCET Take 1 tablet by mouth every 6 (six) hours as  needed for severe pain.   polyethylene glycol packet Commonly known as:  MIRALAX / GLYCOLAX Take 17 g by mouth daily as needed for mild constipation.   pravastatin 40 MG tablet Commonly known as:  PRAVACHOL Take 1 tablet (40 mg total) by mouth daily.   senna-docusate 8.6-50 MG tablet Commonly known as:  Senokot-S Take 2 tablets by mouth 2 (two) times daily.   Walker Misc Use walker whenever walking, standard walker            Discharge Care Instructions  (From admission, onward)         Start     Ordered   11/12/18 0000  Weight bearing as tolerated     11/12/18 1513          Contact information for follow-up providers    [name], MD In 2 weeks.   Specialty:  Orthopedic Surgery Why:  For suture removal, For wound re-check Contact information: [place] [address] [phone]        [facility] Follow up.   Specialty:  [facility] Contact information: [address] [place] [address] [phone]            Contact information for after-discharge care    Destination    [facility] .   Service:  Skilled Nursing Contact information: [address] [place] [phone]                  Signed: Bonnell Public 11/17/2018, 2:41 PM

## 2018-11-17 NOTE — Progress Notes (Signed)
Patient will DC to: [facility] Anticipated DC date: 11/17/2018 Family notified: Daughter, [name] Transport by: Corey Harold    Per MD patient ready for DC to [facility]. RN, patient, patient's family, and facility notified of DC. Discharge Summary and FL2 sent to facility. RN to call report prior to discharge [phone] Room 7012). DC packet on chart. Ambulance transport requested for patient.   CSW will sign off for now as social work intervention is no longer needed. Please consult [place] again if new needs arise.  Cedric Fishman, LCSW Clinical Social Worker [phone]

## 2018-11-17 NOTE — Clinical Social Work Placement (Signed)
   CLINICAL SOCIAL WORK PLACEMENT  NOTE  Date:  11/17/2018  Patient Details  Name: [name] MRN: [id number] Date of Birth: 01/07/1932  Clinical Social Work is seeking post-discharge placement for this patient at the [place] level of care (*CSW will initial, date and re-position this form in  chart as items are completed):  Yes   Patient/family provided with [facility] Work Department's list of facilities offering this level of care within the geographic area requested by the patient (or if unable, by the patient's family).  Yes   Patient/family informed of their freedom to choose among providers that offer the needed level of care, that participate in Medicare, Medicaid or managed care program needed by the patient, have an available bed and are willing to accept the patient.  Yes   Patient/family informed of [facility]'s ownership interest in [facility] and [facility], as well as of the fact that they are under no obligation to receive care at these facilities.  PASRR submitted to EDS on       PASRR number received on       Existing PASRR number confirmed on 11/17/18     FL2 transmitted to all facilities in geographic area requested by pt/family on 11/17/18     FL2 transmitted to all facilities within larger geographic area on       Patient informed that his/her managed care company has contracts with or will negotiate with certain facilities, including the following:        Yes   Patient/family informed of bed offers received.  Patient chooses bed at [facility] recommends and patient chooses bed at      Patient to be transferred to [facility] [place] on 11/17/18.  Patient to be transferred to facility by [facility]     Patient family notified on 11/17/18 of transfer.  Name of family member notified:  Daughter     PHYSICIAN       Additional Comment:     _______________________________________________ Benard Halsted, LCSW 11/17/2018, 1:43 PM

## 2018-11-17 NOTE — Care Management Important Message (Signed)
Important Message  Patient Details  Name: [name] MRN: [id number] Date of Birth: 28-Apr-1932   Medicare Important Message Given:  Yes    Tateanna Bach Montine Circle 11/17/2018, 3:27 PM

## 2018-11-17 NOTE — Plan of Care (Signed)
  Problem: Education: Goal: Verbalization of understanding the information provided (i.e., activity precautions, restrictions, etc) will improve Outcome: Progressing   Problem: Activity: Goal: Ability to ambulate and perform ADLs will improve Outcome: Progressing   Problem: Pain Management: Goal: Pain level will decrease Outcome: Progressing   Problem: Health Behavior/Discharge Planning: Goal: Ability to manage health-related needs will improve Outcome: Progressing

## 2018-11-19 DIAGNOSIS — R531 Weakness: Secondary | ICD-10-CM | POA: Diagnosis not present

## 2018-11-19 DIAGNOSIS — M79604 Pain in right leg: Secondary | ICD-10-CM | POA: Diagnosis not present

## 2018-11-19 DIAGNOSIS — R2689 Other abnormalities of gait and mobility: Secondary | ICD-10-CM | POA: Diagnosis not present

## 2018-11-19 DIAGNOSIS — M25551 Pain in right hip: Secondary | ICD-10-CM | POA: Diagnosis not present

## 2018-11-20 DIAGNOSIS — S72141D Displaced intertrochanteric fracture of right femur, subsequent encounter for closed fracture with routine healing: Secondary | ICD-10-CM | POA: Diagnosis not present

## 2018-11-20 DIAGNOSIS — I10 Essential (primary) hypertension: Secondary | ICD-10-CM | POA: Diagnosis not present

## 2018-11-20 DIAGNOSIS — E785 Hyperlipidemia, unspecified: Secondary | ICD-10-CM | POA: Diagnosis not present

## 2018-11-20 DIAGNOSIS — I679 Cerebrovascular disease, unspecified: Secondary | ICD-10-CM | POA: Diagnosis not present

## 2018-11-25 ENCOUNTER — Other Ambulatory Visit: Payer: Self-pay | Admitting: *Deleted

## 2018-11-25 NOTE — Patient Outreach (Addendum)
[facility]) Care Management  11/25/2018  Jayshawn Colston [birth year] [id number]   Onsite visit to [place].  Met with patient in his room.  Patient reports he lives alone. His daughter [name] lives close by. He has a private pay caregiver M-S for 3 hours in the morning.  He states he does not have any issues with transportation. He lives close to a grocery store.  He reports he does not take much medication and self manages it. Patient states it is ok to call his daughter, Warren Lacy [phone]  [facility] reviewed [facility] and left a packet for patient and daughter to review. Will continue to monitor for any [facility] care management needs.  Royetta Crochet. Laymond Purser, MSN, RN, [employer] , [place] [phone]) Business Cell  [phone]) Toll Free Office

## 2018-11-26 ENCOUNTER — Ambulatory Visit (INDEPENDENT_AMBULATORY_CARE_PROVIDER_SITE_OTHER): Payer: Medicare Other

## 2018-11-26 ENCOUNTER — Ambulatory Visit (INDEPENDENT_AMBULATORY_CARE_PROVIDER_SITE_OTHER): Payer: Medicare Other | Admitting: Orthopaedic Surgery

## 2018-11-26 ENCOUNTER — Other Ambulatory Visit: Payer: Self-pay | Admitting: *Deleted

## 2018-11-26 DIAGNOSIS — M25551 Pain in right hip: Secondary | ICD-10-CM | POA: Diagnosis not present

## 2018-11-26 DIAGNOSIS — S72142D Displaced intertrochanteric fracture of left femur, subsequent encounter for closed fracture with routine healing: Secondary | ICD-10-CM

## 2018-11-26 NOTE — Progress Notes (Signed)
Post-Op Visit Note   Patient: [name]           Date of Birth: 12/23/1931           MRN: [id number] Visit Date: 11/26/2018 PCP: Rutherford Guys, MD   Assessment & Plan:  Chief Complaint:  Chief Complaint  Patient presents with  . Right Hip - Pain   Visit Diagnoses:  1. Closed displaced intertrochanteric fracture of left femur with routine healing, subsequent encounter     Plan: Mr. Siordia is two-week status post IM nailing of a right intertrochanteric hip fracture.  He is currently at [facility] burn.  He is doing well overall.  He does endorse some increased pain with exertion and with physical therapy.  He denies any pain at rest.  His surgical incisions have healed without any signs of infection.  We remove the staples today.  He is to continue with physical therapy for strengthening and mobilization.  He can discontinue Lovenox and begin baby aspirin twice a day for another month.  Recheck in 4 weeks with two-view x-rays of the right hip.  Follow-Up Instructions: Return in about 4 weeks (around 12/24/2018).   Orders:  Orders Placed This Encounter  Procedures  . XR FEMUR, MIN 2 VIEWS RIGHT   No orders of the defined types were placed in this encounter.   Imaging: Xr Femur, Min 2 Views Right  Result Date: 11/26/2018 Stable cephalo-medullary fixation of a right intertrochanteric fracture   PMFS History: Patient Active Problem List   Diagnosis Date Noted  . S/P right hip fracture 11/11/2018  . Community acquired pneumonia of right lower lobe of lung ([place]) 09/29/2018  . Recurrent falls 05/12/2018  . History of CVA (cerebrovascular accident) 01/16/2018  . Nocturnal enuresis   . Fall   . Full incontinence of feces   . Benign essential HTN   . Hypoalbuminemia due to protein-calorie malnutrition ([place])   . Abnormality of gait   . Leg edema   . Hip fracture ([place]) 03/30/2017  . Leukocytosis 03/29/2017  . Mild intermittent asthma with acute exacerbation 01/29/2017  .  Chronic renal insufficiency 09/22/2015  . Tachycardia 09/22/2015  . PVC (premature ventricular contraction) 09/22/2015  . Osteoporosis 06/03/2014  . Basal cell carcinoma of face 06/03/2014  . Benign prostatic hyperplasia 05/27/2013  . Eczema 04/23/2012  . Dyslipidemia 04/23/2012   Past Medical History:  Diagnosis Date  . Allergy    seasonal  fall  . Cancer ([place])    Basal cell carcinoma; multiple  . Eczema   . Hyperlipidemia   . Hypertension   . Osteoporosis   . Stroke [facility])     Family History  Problem Relation Age of Onset  . Arthritis Mother   . Heart disease Mother 10  . Hypertension Father     Past Surgical History:  Procedure Laterality Date  . CATARACT EXTRACTION, BILATERAL  06/29/2015  . INTRAMEDULLARY (IM) NAIL INTERTROCHANTERIC Left 03/30/2017   Procedure: INTRAMEDULLARY (IM) NAIL INTERTROCHANTRIC;  Surgeon: [name], MD;  Location: [place];  Service: Orthopedics;  Laterality: Left;  . INTRAMEDULLARY (IM) NAIL INTERTROCHANTERIC Right 11/12/2018   Procedure: INTRAMEDULLARY (IM) NAIL RIGHT INTERTROCHANTERIC HIP FRACTURE;  Surgeon: [name], MD;  Location: [place];  Service: Orthopedics;  Laterality: Right;  . MOHS SURGERY    . TONSILLECTOMY  1940 maybe   Social History   Occupational History  . Occupation: retired  Tobacco Use  . Smoking status: Never Smoker  . Smokeless tobacco: Never Used  Substance and Sexual Activity  . Alcohol use: No    Comment: occas  . Drug use: No  . Sexual activity: Not Currently

## 2018-11-26 NOTE — Patient Outreach (Signed)
[place] [facility]) Care Management  11/26/2018  [name] [birth year] [id number]   Outreach call attempted to patient daughter, [name].  Unable to reach. Left HIPAA compliant vm with RNCM contact.  Royetta Crochet. Laymond Purser, MSN, RN, [employer] , [place] [phone]) Business Cell  [phone]) Toll Free Office

## 2018-11-27 NOTE — Telephone Encounter (Signed)
Please give verbal order for all the skills requested

## 2018-11-27 NOTE — Telephone Encounter (Signed)
Verbal orders given  

## 2018-12-10 DIAGNOSIS — M62838 Other muscle spasm: Secondary | ICD-10-CM | POA: Diagnosis not present

## 2018-12-10 DIAGNOSIS — M25551 Pain in right hip: Secondary | ICD-10-CM | POA: Diagnosis not present

## 2018-12-10 DIAGNOSIS — R2689 Other abnormalities of gait and mobility: Secondary | ICD-10-CM | POA: Diagnosis not present

## 2018-12-22 ENCOUNTER — Other Ambulatory Visit: Payer: Self-pay | Admitting: *Deleted

## 2018-12-22 NOTE — Patient Outreach (Signed)
[facility]) Care Management  12/22/2018  [name] June 16, 1932 [id number]  Collaboration with Jerilynn Som, SW at facility, patient is set to discharge 12/30/2018 Outreach call to patient daughter Alanda Slim, no answer, left HIPAA compliant voicemail With [facility] contact.  Royetta Crochet. Laymond Purser, MSN, RN, [employer] , [place] [phone]) Business Cell  [phone]) Toll Free Office

## 2018-12-24 ENCOUNTER — Ambulatory Visit (INDEPENDENT_AMBULATORY_CARE_PROVIDER_SITE_OTHER): Payer: Medicare Other | Admitting: Physician Assistant

## 2018-12-24 ENCOUNTER — Ambulatory Visit (INDEPENDENT_AMBULATORY_CARE_PROVIDER_SITE_OTHER): Payer: Medicare Other

## 2018-12-24 ENCOUNTER — Encounter (INDEPENDENT_AMBULATORY_CARE_PROVIDER_SITE_OTHER): Payer: Self-pay | Admitting: Orthopaedic Surgery

## 2018-12-24 DIAGNOSIS — M25551 Pain in right hip: Secondary | ICD-10-CM

## 2018-12-24 NOTE — Progress Notes (Signed)
Post-Op Visit Note   Patient: [name]           Date of Birth: 1932/09/30           MRN: [id number] Visit Date: 12/24/2018 PCP: Rutherford Guys, MD   Assessment & Plan:  Chief Complaint:  Chief Complaint  Patient presents with  . Right Hip - Follow-up   Visit Diagnoses:  1. Pain in right hip     Plan: Patient is a pleasant 84 year old gentleman who presents to our clinic today 6 weeks status post right hip IM nail, date of surgery 11/12/2018.  He has been residing at [facility] burn.  He has been ambulating with a walker with physical therapy.  He comes in today in a wheelchair.  Minimal pain at rest.  Does have increased pain when standing or going from a seated to standing position.  No fevers or chills.  Overall making progress.  Examination of his right leg incisions reveal well-healed surgical incisions.  The most distal incision still has staples intact.  Calf soft and nontender.  Today, staples were removed.  We will have the patient continue weightbearing as tolerated and continue to work with physical therapy.  He will follow-up with [place] in 6 weeks time for repeat evaluation and x-rays.  Follow-Up Instructions: Return in about 6 weeks (around 02/04/2019).   Orders:  Orders Placed This Encounter  Procedures  . XR HIP UNILAT W OR W/O PELVIS 2-3 VIEWS RIGHT   No orders of the defined types were placed in this encounter.   Imaging: Xr Hip Unilat W Or W/o Pelvis 2-3 Views Right  Result Date: 12/24/2018 X-rays demonstrate stable alignment of the fracture with evidence of healing   PMFS History: Patient Active Problem List   Diagnosis Date Noted  . Pain in right hip 12/24/2018  . S/P right hip fracture 11/11/2018  . Community acquired pneumonia of right lower lobe of lung ([place]) 09/29/2018  . Recurrent falls 05/12/2018  . History of CVA (cerebrovascular accident) 01/16/2018  . Nocturnal enuresis   . Fall   . Full incontinence of feces   . Benign essential HTN   .  Hypoalbuminemia due to protein-calorie malnutrition ([place])   . Abnormality of gait   . Leg edema   . Hip fracture ([place]) 03/30/2017  . Leukocytosis 03/29/2017  . Mild intermittent asthma with acute exacerbation 01/29/2017  . Chronic renal insufficiency 09/22/2015  . Tachycardia 09/22/2015  . PVC (premature ventricular contraction) 09/22/2015  . Osteoporosis 06/03/2014  . Basal cell carcinoma of face 06/03/2014  . Benign prostatic hyperplasia 05/27/2013  . Eczema 04/23/2012  . Dyslipidemia 04/23/2012   Past Medical History:  Diagnosis Date  . Allergy    seasonal  fall  . Cancer ([place])    Basal cell carcinoma; multiple  . Eczema   . Hyperlipidemia   . Hypertension   . Osteoporosis   . Stroke [facility])     Family History  Problem Relation Age of Onset  . Arthritis Mother   . Heart disease Mother 42  . Hypertension Father     Past Surgical History:  Procedure Laterality Date  . CATARACT EXTRACTION, BILATERAL  06/29/2015  . INTRAMEDULLARY (IM) NAIL INTERTROCHANTERIC Left 03/30/2017   Procedure: INTRAMEDULLARY (IM) NAIL INTERTROCHANTRIC;  Surgeon: [name], MD;  Location: [place];  Service: Orthopedics;  Laterality: Left;  . INTRAMEDULLARY (IM) NAIL INTERTROCHANTERIC Right 11/12/2018   Procedure: INTRAMEDULLARY (IM) NAIL RIGHT INTERTROCHANTERIC HIP FRACTURE;  Surgeon: [name], MD;  Location: [place];  Service: Orthopedics;  Laterality: Right;  . MOHS SURGERY    . TONSILLECTOMY  1940 maybe   Social History   Occupational History  . Occupation: retired  Tobacco Use  . Smoking status: Never Smoker  . Smokeless tobacco: Never Used  Substance and Sexual Activity  . Alcohol use: No    Comment: occas  . Drug use: No  . Sexual activity: Not Currently

## 2019-01-02 DIAGNOSIS — M810 Age-related osteoporosis without current pathological fracture: Secondary | ICD-10-CM | POA: Diagnosis not present

## 2019-01-02 DIAGNOSIS — Z8673 Personal history of transient ischemic attack (TIA), and cerebral infarction without residual deficits: Secondary | ICD-10-CM | POA: Diagnosis not present

## 2019-01-02 DIAGNOSIS — S72351D Displaced comminuted fracture of shaft of right femur, subsequent encounter for closed fracture with routine healing: Secondary | ICD-10-CM | POA: Diagnosis not present

## 2019-01-02 DIAGNOSIS — J4520 Mild intermittent asthma, uncomplicated: Secondary | ICD-10-CM | POA: Diagnosis not present

## 2019-01-02 DIAGNOSIS — W19XXXD Unspecified fall, subsequent encounter: Secondary | ICD-10-CM | POA: Diagnosis not present

## 2019-01-02 DIAGNOSIS — E440 Moderate protein-calorie malnutrition: Secondary | ICD-10-CM | POA: Diagnosis not present

## 2019-01-02 DIAGNOSIS — I10 Essential (primary) hypertension: Secondary | ICD-10-CM | POA: Diagnosis not present

## 2019-01-04 ENCOUNTER — Other Ambulatory Visit: Payer: Self-pay

## 2019-01-04 ENCOUNTER — Telehealth (INDEPENDENT_AMBULATORY_CARE_PROVIDER_SITE_OTHER): Payer: Self-pay | Admitting: Orthopaedic Surgery

## 2019-01-04 DIAGNOSIS — Z8673 Personal history of transient ischemic attack (TIA), and cerebral infarction without residual deficits: Secondary | ICD-10-CM | POA: Diagnosis not present

## 2019-01-04 DIAGNOSIS — E440 Moderate protein-calorie malnutrition: Secondary | ICD-10-CM | POA: Diagnosis not present

## 2019-01-04 DIAGNOSIS — S72351D Displaced comminuted fracture of shaft of right femur, subsequent encounter for closed fracture with routine healing: Secondary | ICD-10-CM | POA: Diagnosis not present

## 2019-01-04 DIAGNOSIS — J4520 Mild intermittent asthma, uncomplicated: Secondary | ICD-10-CM | POA: Diagnosis not present

## 2019-01-04 DIAGNOSIS — I10 Essential (primary) hypertension: Secondary | ICD-10-CM | POA: Diagnosis not present

## 2019-01-04 DIAGNOSIS — M810 Age-related osteoporosis without current pathological fracture: Secondary | ICD-10-CM | POA: Diagnosis not present

## 2019-01-04 NOTE — Telephone Encounter (Signed)
IC verbal given.  

## 2019-01-04 NOTE — Patient Outreach (Signed)
[facility] [facility]) Care Management  01/04/2019  [name] Jun 03, 1932 [id number]   EMMI- General Discharge RED ON EMMI ALERT Day # 1 Date: 01/02/2019 Red Alert Reason:   Know who to call about changes in condition? No  Unfilled prescriptions? Yes  Other questions/problems? Yes    Outreach attempt: Spoke with patient. He is able to verify HIPAA.  Patient states that he is doing fine at home. Addressed red alerts with patient. Patient denies alerts. Patient states that he has a caregiver that comes in to help him.  He states the has all his medications and know to get in contact with physician for any problems.  Patient denies any needs or questions at this time.    Plan: RN CM will close case at this time.     Wesley Baseman, RN, MSN [facility] Care Management Care Management Coordinator Direct Line [phone] Toll Free: [phone]  Fax: [phone]

## 2019-01-04 NOTE — Telephone Encounter (Signed)
B[place]y Physical Therapist with [facility] called in wanting to get verbal orders for this patient to continue pt for 2 times a weeks for 4 weeks and 1 time a week for 3 weeks.  Please call [phone]

## 2019-01-05 ENCOUNTER — Telehealth (INDEPENDENT_AMBULATORY_CARE_PROVIDER_SITE_OTHER): Payer: Self-pay | Admitting: Orthopaedic Surgery

## 2019-01-05 NOTE — Telephone Encounter (Signed)
Will, PT, he did not specify which company he is with, left a message yesterday requesting VO for OT for the following:  1x a week for 1 week 2x a week for 1 week 1x a week for 1 week  CB# [phone].  Thank you.

## 2019-01-06 DIAGNOSIS — M810 Age-related osteoporosis without current pathological fracture: Secondary | ICD-10-CM | POA: Diagnosis not present

## 2019-01-06 DIAGNOSIS — Z8673 Personal history of transient ischemic attack (TIA), and cerebral infarction without residual deficits: Secondary | ICD-10-CM | POA: Diagnosis not present

## 2019-01-06 DIAGNOSIS — S72351D Displaced comminuted fracture of shaft of right femur, subsequent encounter for closed fracture with routine healing: Secondary | ICD-10-CM | POA: Diagnosis not present

## 2019-01-06 DIAGNOSIS — J4520 Mild intermittent asthma, uncomplicated: Secondary | ICD-10-CM | POA: Diagnosis not present

## 2019-01-06 DIAGNOSIS — I10 Essential (primary) hypertension: Secondary | ICD-10-CM | POA: Diagnosis not present

## 2019-01-06 DIAGNOSIS — E440 Moderate protein-calorie malnutrition: Secondary | ICD-10-CM | POA: Diagnosis not present

## 2019-01-06 NOTE — Telephone Encounter (Signed)
Called to approve orders 

## 2019-01-07 DIAGNOSIS — M810 Age-related osteoporosis without current pathological fracture: Secondary | ICD-10-CM | POA: Diagnosis not present

## 2019-01-07 DIAGNOSIS — J4520 Mild intermittent asthma, uncomplicated: Secondary | ICD-10-CM | POA: Diagnosis not present

## 2019-01-07 DIAGNOSIS — I10 Essential (primary) hypertension: Secondary | ICD-10-CM | POA: Diagnosis not present

## 2019-01-07 DIAGNOSIS — E440 Moderate protein-calorie malnutrition: Secondary | ICD-10-CM | POA: Diagnosis not present

## 2019-01-07 DIAGNOSIS — Z8673 Personal history of transient ischemic attack (TIA), and cerebral infarction without residual deficits: Secondary | ICD-10-CM | POA: Diagnosis not present

## 2019-01-07 DIAGNOSIS — S72351D Displaced comminuted fracture of shaft of right femur, subsequent encounter for closed fracture with routine healing: Secondary | ICD-10-CM | POA: Diagnosis not present

## 2019-01-11 DIAGNOSIS — E440 Moderate protein-calorie malnutrition: Secondary | ICD-10-CM | POA: Diagnosis not present

## 2019-01-11 DIAGNOSIS — M810 Age-related osteoporosis without current pathological fracture: Secondary | ICD-10-CM | POA: Diagnosis not present

## 2019-01-11 DIAGNOSIS — J4520 Mild intermittent asthma, uncomplicated: Secondary | ICD-10-CM | POA: Diagnosis not present

## 2019-01-11 DIAGNOSIS — Z8673 Personal history of transient ischemic attack (TIA), and cerebral infarction without residual deficits: Secondary | ICD-10-CM | POA: Diagnosis not present

## 2019-01-11 DIAGNOSIS — I10 Essential (primary) hypertension: Secondary | ICD-10-CM | POA: Diagnosis not present

## 2019-01-11 DIAGNOSIS — S72351D Displaced comminuted fracture of shaft of right femur, subsequent encounter for closed fracture with routine healing: Secondary | ICD-10-CM | POA: Diagnosis not present

## 2019-01-13 ENCOUNTER — Ambulatory Visit (INDEPENDENT_AMBULATORY_CARE_PROVIDER_SITE_OTHER): Payer: Medicare Other

## 2019-01-13 ENCOUNTER — Other Ambulatory Visit: Payer: Self-pay

## 2019-01-13 ENCOUNTER — Ambulatory Visit (INDEPENDENT_AMBULATORY_CARE_PROVIDER_SITE_OTHER): Payer: Medicare Other | Admitting: Family Medicine

## 2019-01-13 ENCOUNTER — Encounter: Payer: Self-pay | Admitting: Family Medicine

## 2019-01-13 VITALS — BP 120/80 | HR 78 | Temp 97.5°F | Resp 16 | Ht 70.0 in | Wt 151.0 lb

## 2019-01-13 DIAGNOSIS — S72351D Displaced comminuted fracture of shaft of right femur, subsequent encounter for closed fracture with routine healing: Secondary | ICD-10-CM | POA: Diagnosis not present

## 2019-01-13 DIAGNOSIS — J302 Other seasonal allergic rhinitis: Secondary | ICD-10-CM

## 2019-01-13 DIAGNOSIS — Z8701 Personal history of pneumonia (recurrent): Secondary | ICD-10-CM | POA: Diagnosis not present

## 2019-01-13 DIAGNOSIS — K449 Diaphragmatic hernia without obstruction or gangrene: Secondary | ICD-10-CM

## 2019-01-13 DIAGNOSIS — Z8673 Personal history of transient ischemic attack (TIA), and cerebral infarction without residual deficits: Secondary | ICD-10-CM | POA: Diagnosis not present

## 2019-01-13 DIAGNOSIS — E440 Moderate protein-calorie malnutrition: Secondary | ICD-10-CM | POA: Diagnosis not present

## 2019-01-13 DIAGNOSIS — I10 Essential (primary) hypertension: Secondary | ICD-10-CM

## 2019-01-13 DIAGNOSIS — M810 Age-related osteoporosis without current pathological fracture: Secondary | ICD-10-CM | POA: Diagnosis not present

## 2019-01-13 DIAGNOSIS — Z7409 Other reduced mobility: Secondary | ICD-10-CM

## 2019-01-13 DIAGNOSIS — R0602 Shortness of breath: Secondary | ICD-10-CM | POA: Diagnosis not present

## 2019-01-13 DIAGNOSIS — Z8781 Personal history of (healed) traumatic fracture: Secondary | ICD-10-CM

## 2019-01-13 DIAGNOSIS — J4520 Mild intermittent asthma, uncomplicated: Secondary | ICD-10-CM | POA: Diagnosis not present

## 2019-01-13 DIAGNOSIS — J181 Lobar pneumonia, unspecified organism: Secondary | ICD-10-CM | POA: Diagnosis not present

## 2019-01-13 DIAGNOSIS — J189 Pneumonia, unspecified organism: Secondary | ICD-10-CM

## 2019-01-13 MED ORDER — MONTELUKAST SODIUM 10 MG PO TABS: 10.0000 mg | ORAL_TABLET | Freq: Every day | ORAL | 3 refills | Status: DC

## 2019-01-13 NOTE — Progress Notes (Signed)
3/18/20205:00 PM  [name] January 30, 1932, 85 y.o. male [id number]  Chief Complaint  Patient presents with   Hospitalization Follow-up    here for a hospital f/u for post pneumonia doing much better but still have a little cough and sob. Going through rehab for the broken hip. Had PT today    HPI:   Patient is a 85 y.o. male with past medical history significant for HTN, asthma, BPH, CKD, HLP, CVA, osteoporosis who presents today for hosp followup  Has PNA in nov 2019 and R hip fracture in jan 2020 Patient underwent intramedullary implant on 11/12/2018 for the right intertrochanteric fracture Released to SNF Discharged home 12/30/2018 Home PT, using walker or cane,  Has 7 steps to get in and out of his house Caretaker is here with him, he has 24 hour care  Not on any pain meds anymore, having some constipation issues, started to use stool softners, appettite is good No nausea, vomiting Caretaker reports last week increased SOB and wheezing, patient denies any respiratory sx, Has been using albuterol for these sx Overall breathing issues doing better this week Reports alendronate started several months ago Takes prednisone prn for eczema Allergies are not well controlled, having lots of runny nose, itchy watery eyes, sneezing   Fall Risk  01/13/2019 09/29/2018 09/11/2018 07/27/2018 05/11/2018  Falls in the past year? 0 1 1 Yes Yes  Comment - - - - -  Number falls in past yr: - 1 0 2 or more 1  Injury with Fall? 1 0 1 Yes No  Comment broke his right hip - - - -  Risk Factor Category  - - - - High Fall Risk  Risk for fall due to : History of fall(s);Impaired balance/gait - - - -  Follow up Falls evaluation completed - - - -     Depression screen [facility] 2/9 01/13/2019 09/29/2018 09/11/2018  Decreased Interest 0 0 0  Down, Depressed, Hopeless 0 0 0  PHQ - 2 Score 0 0 0  Altered sleeping - - 0  Tired, decreased energy - - 0  Change in appetite - - 0  Feeling bad or failure about  yourself  - - 0  Trouble concentrating - - 0  Moving slowly or fidgety/restless - - 0  Suicidal thoughts - - 0  PHQ-9 Score - - 0  Difficult doing work/chores - - Not difficult at all    Allergies  Allergen Reactions   Seasonal Ic [Cholestatin]     Prior to Admission medications   Medication Sig Start Date End Date Taking? Authorizing Provider  albuterol (PROVENTIL HFA;VENTOLIN HFA) 108 (90 Base) MCG/ACT inhaler Inhale 2 puffs into the lungs every 6 (six) hours as needed for wheezing or shortness of breath. 09/29/18  Yes Forrest Moron, MD  alendronate (FOSAMAX) 70 MG tablet Take 1 tablet (70 mg total) by mouth every 7 (seven) days. Take with a full glass of water on an empty stomach. 07/27/18  Yes Rutherford Guys, MD  aspirin EC 81 MG EC tablet Take 1 tablet (81 mg total) by mouth daily. 01/21/18  Yes Velvet Bathe, MD  clobetasol ointment (TEMOVATE) 4.00 % Apply 1 application topically 2 (two) times daily. 07/27/18  Yes Rutherford Guys, MD  enoxaparin (LOVENOX) 40 MG/0.4ML injection Inject 0.4 mLs (40 mg total) into the skin daily. 11/12/18  Yes [name], MD  feeding supplement, ENSURE ENLIVE, (ENSURE ENLIVE) LIQD Take 237 mLs by mouth 2 (two) times daily between  meals. 11/18/18  Yes Bonnell Public, MD  metoprolol succinate (TOPROL-XL) 50 MG 24 hr tablet Take 1 tablet (50 mg total) by mouth daily. Take with or immediately following a meal. 07/27/18  Yes Rutherford Guys, MD  Misc. Devices [facility]) MISC Use walker whenever walking, standard walker 07/27/18  Yes Rutherford Guys, MD  oxyCODONE-acetaminophen (PERCOCET) 5-325 MG tablet Take 1 tablet by mouth every 6 (six) hours as needed for severe pain. 11/12/18  Yes [name], MD  polyethylene glycol (MIRALAX / GLYCOLAX) packet Take 17 g by mouth daily as needed for mild constipation. 11/17/18  Yes Dana Allan I, MD  pravastatin (PRAVACHOL) 40 MG tablet Take 1 tablet (40 mg total) by mouth daily. 07/27/18  Yes Rutherford Guys,  MD  senna-docusate (SENOKOT-S) 8.6-50 MG tablet Take 2 tablets by mouth 2 (two) times daily. 11/17/18  Yes Bonnell Public, MD  Skin Protectants, Misc. (EUCERIN) cream Apply 1 application topically as needed for dry skin. 02/18/18  Yes Wardell Honour, MD    Past Medical History:  Diagnosis Date   Allergy    seasonal  fall   Cancer [facility])    Basal cell carcinoma; multiple   Eczema    Hyperlipidemia    Hypertension    Osteoporosis    Stroke [facility])     Past Surgical History:  Procedure Laterality Date   CATARACT EXTRACTION, BILATERAL  06/29/2015   INTRAMEDULLARY (IM) NAIL INTERTROCHANTERIC Left 03/30/2017   Procedure: INTRAMEDULLARY (IM) NAIL INTERTROCHANTRIC;  Surgeon: [name], MD;  Location: [place];  Service: Orthopedics;  Laterality: Left;   INTRAMEDULLARY (IM) NAIL INTERTROCHANTERIC Right 11/12/2018   Procedure: INTRAMEDULLARY (IM) NAIL RIGHT INTERTROCHANTERIC HIP FRACTURE;  Surgeon: [name], MD;  Location: [place];  Service: Orthopedics;  Laterality: Right;   MOHS SURGERY     TONSILLECTOMY  1940 maybe    Social History   Tobacco Use   Smoking status: Never Smoker   Smokeless tobacco: Never Used  Substance Use Topics   Alcohol use: No    Comment: occas    Family History  Problem Relation Age of Onset   Arthritis Mother    Heart disease Mother 40   Hypertension Father     Review of Systems  Constitutional: Negative for chills and fever.  Cardiovascular: Negative for chest pain and palpitations.  Gastrointestinal: Negative for blood in stool, heartburn and melena.  Per hpi  OBJECTIVE:  Blood pressure 120/80, pulse 78, temperature (!) 97.5 F (36.4 C), temperature source Oral, resp. rate 16, height 5\' 10"  (1.778 m), weight 151 lb (68.5 kg), SpO2 97 %. Body mass index is 21.67 kg/m.   Physical Exam Vitals signs and nursing note reviewed.  Constitutional:      Appearance: He is well-developed.     Comments: Frail, using walker    HENT:     Head: Normocephalic and atraumatic.  Eyes:     Conjunctiva/sclera: Conjunctivae normal.     Pupils: Pupils are equal, round, and reactive to light.  Neck:     Musculoskeletal: Neck supple.  Cardiovascular:     Rate and Rhythm: Normal rate and regular rhythm.     Heart sounds: No murmur. No friction rub. No gallop.   Pulmonary:     Effort: Pulmonary effort is normal.     Breath sounds: Normal breath sounds. No wheezing or rales.  Skin:    General: Skin is warm and dry.  Neurological:     Mental Status: He  is alert and oriented to person, place, and time.    Dg Chest 2 View  Result Date: 01/13/2019 CLINICAL DATA:  Chest congestion and shortness of breath. EXAM: CHEST - 2 VIEW COMPARISON:  09/29/2018. FINDINGS: Normal sized heart. Moderate-sized hiatal hernia. Minimal residual linear scarring at the left lung base. Stable mild right lateral pleural thickening and large right diaphragmatic eventration. Diffuse osteopenia. Small area of avascular necrosis in the right humeral head. IMPRESSION: 1. No acute abnormality. 2. Moderate-sized hiatal hernia. Electronically Signed   By: Claudie Revering M.D.   On: 01/13/2019 16:22     ASSESSMENT and PLAN  1. History of recent pneumonia Resolved.  - DG Chest 2 View; Future  2. S/P right hip fracture Recovering well. Continue with home health PT.   3. Impaired mobility Using walker or cane as appropriate  4. Essential hypertension Controlled. Continue current regime.   5. Seasonal allergies Uncontrolled. Starting singulair. Reviewed r/se/b.   6. Hiatal hernia Incidental finding on xray. Asymptomatic. Reviewed ssx of concern.   Other orders - montelukast (SINGULAIR) 10 MG tablet; Take 1 tablet (10 mg total) by mouth at bedtime.    Return in about 3 months (around 04/15/2019).    Rutherford Guys, MD Primary Care at [facility], [place] [address] Ph.  [phone] Fax [phone]

## 2019-01-13 NOTE — Patient Instructions (Signed)
° ° ° °  If you have lab work done today you will be contacted with your lab results within the next 2 weeks.  If you have not heard from us then please contact us. The fastest way to get your results is to register for My Chart. ° ° °IF you received an x-ray today, you will receive an invoice from [facility]. Please contact [facility] at [phone] with questions or concerns regarding your invoice.  ° °IF you received labwork today, you will receive an invoice from [facility]. Please contact [facility] at [phone] with questions or concerns regarding your invoice.  ° °Our billing staff will not be able to assist you with questions regarding bills from these companies. ° °You will be contacted with the lab results as soon as they are available. The fastest way to get your results is to activate your My Chart account. Instructions are located on the last page of this paperwork. If you have not heard from us regarding the results in 2 weeks, please contact this office. °  ° ° ° °

## 2019-01-15 DIAGNOSIS — E440 Moderate protein-calorie malnutrition: Secondary | ICD-10-CM | POA: Diagnosis not present

## 2019-01-15 DIAGNOSIS — S72351D Displaced comminuted fracture of shaft of right femur, subsequent encounter for closed fracture with routine healing: Secondary | ICD-10-CM | POA: Diagnosis not present

## 2019-01-15 DIAGNOSIS — J4520 Mild intermittent asthma, uncomplicated: Secondary | ICD-10-CM | POA: Diagnosis not present

## 2019-01-15 DIAGNOSIS — M810 Age-related osteoporosis without current pathological fracture: Secondary | ICD-10-CM | POA: Diagnosis not present

## 2019-01-15 DIAGNOSIS — Z8673 Personal history of transient ischemic attack (TIA), and cerebral infarction without residual deficits: Secondary | ICD-10-CM | POA: Diagnosis not present

## 2019-01-15 DIAGNOSIS — I10 Essential (primary) hypertension: Secondary | ICD-10-CM | POA: Diagnosis not present

## 2019-01-18 DIAGNOSIS — J4520 Mild intermittent asthma, uncomplicated: Secondary | ICD-10-CM | POA: Diagnosis not present

## 2019-01-18 DIAGNOSIS — Z8673 Personal history of transient ischemic attack (TIA), and cerebral infarction without residual deficits: Secondary | ICD-10-CM | POA: Diagnosis not present

## 2019-01-18 DIAGNOSIS — I10 Essential (primary) hypertension: Secondary | ICD-10-CM | POA: Diagnosis not present

## 2019-01-18 DIAGNOSIS — E440 Moderate protein-calorie malnutrition: Secondary | ICD-10-CM | POA: Diagnosis not present

## 2019-01-18 DIAGNOSIS — S72351D Displaced comminuted fracture of shaft of right femur, subsequent encounter for closed fracture with routine healing: Secondary | ICD-10-CM | POA: Diagnosis not present

## 2019-01-18 DIAGNOSIS — M810 Age-related osteoporosis without current pathological fracture: Secondary | ICD-10-CM | POA: Diagnosis not present

## 2019-01-20 DIAGNOSIS — J4520 Mild intermittent asthma, uncomplicated: Secondary | ICD-10-CM | POA: Diagnosis not present

## 2019-01-20 DIAGNOSIS — S72351D Displaced comminuted fracture of shaft of right femur, subsequent encounter for closed fracture with routine healing: Secondary | ICD-10-CM | POA: Diagnosis not present

## 2019-01-20 DIAGNOSIS — M810 Age-related osteoporosis without current pathological fracture: Secondary | ICD-10-CM | POA: Diagnosis not present

## 2019-01-20 DIAGNOSIS — I10 Essential (primary) hypertension: Secondary | ICD-10-CM | POA: Diagnosis not present

## 2019-01-20 DIAGNOSIS — Z8673 Personal history of transient ischemic attack (TIA), and cerebral infarction without residual deficits: Secondary | ICD-10-CM | POA: Diagnosis not present

## 2019-01-20 DIAGNOSIS — E440 Moderate protein-calorie malnutrition: Secondary | ICD-10-CM | POA: Diagnosis not present

## 2019-01-25 DIAGNOSIS — E440 Moderate protein-calorie malnutrition: Secondary | ICD-10-CM | POA: Diagnosis not present

## 2019-01-25 DIAGNOSIS — S72351D Displaced comminuted fracture of shaft of right femur, subsequent encounter for closed fracture with routine healing: Secondary | ICD-10-CM | POA: Diagnosis not present

## 2019-01-25 DIAGNOSIS — M810 Age-related osteoporosis without current pathological fracture: Secondary | ICD-10-CM | POA: Diagnosis not present

## 2019-01-25 DIAGNOSIS — I10 Essential (primary) hypertension: Secondary | ICD-10-CM | POA: Diagnosis not present

## 2019-01-25 DIAGNOSIS — J4520 Mild intermittent asthma, uncomplicated: Secondary | ICD-10-CM | POA: Diagnosis not present

## 2019-01-25 DIAGNOSIS — Z8673 Personal history of transient ischemic attack (TIA), and cerebral infarction without residual deficits: Secondary | ICD-10-CM | POA: Diagnosis not present

## 2019-01-27 DIAGNOSIS — M810 Age-related osteoporosis without current pathological fracture: Secondary | ICD-10-CM | POA: Diagnosis not present

## 2019-01-27 DIAGNOSIS — Z8673 Personal history of transient ischemic attack (TIA), and cerebral infarction without residual deficits: Secondary | ICD-10-CM | POA: Diagnosis not present

## 2019-01-27 DIAGNOSIS — J4520 Mild intermittent asthma, uncomplicated: Secondary | ICD-10-CM | POA: Diagnosis not present

## 2019-01-27 DIAGNOSIS — I10 Essential (primary) hypertension: Secondary | ICD-10-CM | POA: Diagnosis not present

## 2019-01-27 DIAGNOSIS — S72351D Displaced comminuted fracture of shaft of right femur, subsequent encounter for closed fracture with routine healing: Secondary | ICD-10-CM | POA: Diagnosis not present

## 2019-01-27 DIAGNOSIS — E440 Moderate protein-calorie malnutrition: Secondary | ICD-10-CM | POA: Diagnosis not present

## 2019-01-28 ENCOUNTER — Telehealth (INDEPENDENT_AMBULATORY_CARE_PROVIDER_SITE_OTHER): Payer: Self-pay | Admitting: Orthopaedic Surgery

## 2019-01-28 NOTE — Telephone Encounter (Signed)
Betsy, PT, from [facility] left a message requesting VO to extend PT for the following:  2x a week for 4 weeks 1x a week for 1 week  CB#[phone].  Thank you.

## 2019-01-28 NOTE — Telephone Encounter (Signed)
Called Betsy to approve orders

## 2019-02-01 DIAGNOSIS — W19XXXD Unspecified fall, subsequent encounter: Secondary | ICD-10-CM | POA: Diagnosis not present

## 2019-02-01 DIAGNOSIS — S72351D Displaced comminuted fracture of shaft of right femur, subsequent encounter for closed fracture with routine healing: Secondary | ICD-10-CM | POA: Diagnosis not present

## 2019-02-01 DIAGNOSIS — M810 Age-related osteoporosis without current pathological fracture: Secondary | ICD-10-CM | POA: Diagnosis not present

## 2019-02-01 DIAGNOSIS — J4520 Mild intermittent asthma, uncomplicated: Secondary | ICD-10-CM | POA: Diagnosis not present

## 2019-02-01 DIAGNOSIS — I10 Essential (primary) hypertension: Secondary | ICD-10-CM | POA: Diagnosis not present

## 2019-02-01 DIAGNOSIS — Z8673 Personal history of transient ischemic attack (TIA), and cerebral infarction without residual deficits: Secondary | ICD-10-CM | POA: Diagnosis not present

## 2019-02-01 DIAGNOSIS — E440 Moderate protein-calorie malnutrition: Secondary | ICD-10-CM | POA: Diagnosis not present

## 2019-02-02 DIAGNOSIS — Z8673 Personal history of transient ischemic attack (TIA), and cerebral infarction without residual deficits: Secondary | ICD-10-CM | POA: Diagnosis not present

## 2019-02-02 DIAGNOSIS — S72351D Displaced comminuted fracture of shaft of right femur, subsequent encounter for closed fracture with routine healing: Secondary | ICD-10-CM | POA: Diagnosis not present

## 2019-02-02 DIAGNOSIS — M810 Age-related osteoporosis without current pathological fracture: Secondary | ICD-10-CM | POA: Diagnosis not present

## 2019-02-02 DIAGNOSIS — E440 Moderate protein-calorie malnutrition: Secondary | ICD-10-CM | POA: Diagnosis not present

## 2019-02-02 DIAGNOSIS — I10 Essential (primary) hypertension: Secondary | ICD-10-CM | POA: Diagnosis not present

## 2019-02-02 DIAGNOSIS — J4520 Mild intermittent asthma, uncomplicated: Secondary | ICD-10-CM | POA: Diagnosis not present

## 2019-02-03 ENCOUNTER — Telehealth (INDEPENDENT_AMBULATORY_CARE_PROVIDER_SITE_OTHER): Payer: Medicare Other | Admitting: Family Medicine

## 2019-02-03 ENCOUNTER — Telehealth: Payer: Self-pay | Admitting: Family Medicine

## 2019-02-03 ENCOUNTER — Other Ambulatory Visit: Payer: Self-pay

## 2019-02-03 ENCOUNTER — Telehealth (INDEPENDENT_AMBULATORY_CARE_PROVIDER_SITE_OTHER): Payer: Self-pay

## 2019-02-03 DIAGNOSIS — J4521 Mild intermittent asthma with (acute) exacerbation: Secondary | ICD-10-CM

## 2019-02-03 DIAGNOSIS — R062 Wheezing: Secondary | ICD-10-CM

## 2019-02-03 MED ORDER — FLUTICASONE PROPIONATE HFA 110 MCG/ACT IN AERO: 1.0000 | INHALATION_SPRAY | Freq: Two times a day (BID) | RESPIRATORY_TRACT | 2 refills | Status: DC

## 2019-02-03 NOTE — Telephone Encounter (Signed)
Called patient and asked the screening questions.  Do you have now or have you had in the past 7 days a fever and/or chills? NO  Do you have now or have you had in the past 7 days a cough? NO  Do you have now or have you had in the last 7 days nausea, vomiting or abdominal pain? NO  Have you been exposed to anyone who has tested positive for COVID-19? NO  Have you or anyone who lives with you traveled within the last month? NO 

## 2019-02-03 NOTE — Telephone Encounter (Signed)
Copied from [place] [id number]. Topic: General - Other >> Feb 03, 2019  5:28 PM Keene Breath wrote: Reason for CRM: Patient's daughter called to inform the doctor that the script for fluticasone (FLOVENT HFA) 110 MCG/ACT inhaler was too expensive ($275) for her father.  She would like to know if there is an alternative inhaler that is less expensive.  Please advise and call daughter back at [phone]

## 2019-02-03 NOTE — Progress Notes (Signed)
Virtual Visit via Telephone Note  I connected with [name] on 02/03/19 at 12:19 PM by telephone and verified that I am speaking with the correct person using two identifiers.   I discussed the limitations, risks, security and privacy concerns of performing an evaluation and management service by telephone and the availability of in person appointments. I also discussed with the patient that there may be a patient responsible charge related to this service. The patient expressed understanding and agreed to proceed, consent obtained  Chief complaint: wheezing, dyspnea.   History of Present Illness: [id number], Wesley Argue, MD  Speaking with [name] his caregiver. Lives in his own home, and 24 hr care.  Seen 01/13/19 - resolved PNA at that time.  Had some wheezing when he went home - can hear wheezing from the other room.  Therapist noted O2 sat dropped to 93 with walking.  Improves at rest, but loud wheezing remains.  No fever.  BP 114/67.  Temp 97.3.  Wheezing since left hospital, but improved, then worsened in past week. Wheezing more severe now. Some increased cough. No body aches. No chest pain.  Using albuterol off and on initially, but now every 5-6 hours. Only seems to improve for about 10 mins, but last night did not improve with albuterol. Still using every 5 hrs today.  Drinking and eating well.  Ankles look a little swollen, but better today. When seen by urgent care for PNA prior to visit with [name], treated with abx and prednisone which helped.  Therapist thought she heard crackling few days ago on one side only.   He reported feeling short of breath last night, no worsening    Dg Chest 2 View  Result Date: 01/13/2019 CLINICAL DATA:  Chest congestion and shortness of breath. EXAM: CHEST - 2 VIEW COMPARISON:  09/29/2018. FINDINGS: Normal sized heart. Moderate-sized hiatal hernia. Minimal residual linear scarring at the left lung base. Stable mild right lateral pleural  thickening and large right diaphragmatic eventration. Diffuse osteopenia. Small area of avascular necrosis in the right humeral head. IMPRESSION: 1. No acute abnormality. 2. Moderate-sized hiatal hernia. Electronically Signed   By: Claudie Revering M.D.   On: 01/13/2019 16:22             Patient Active Problem List   Diagnosis Date Noted  . Impaired mobility 01/13/2019  . Seasonal allergies 01/13/2019  . Hiatal hernia 01/13/2019  . Pain in right hip 12/24/2018  . S/P right hip fracture 11/11/2018  . Recurrent falls 05/12/2018  . History of CVA (cerebrovascular accident) 01/16/2018  . Nocturnal enuresis   . Fall   . Full incontinence of feces   . Essential hypertension   . Hypoalbuminemia due to protein-calorie malnutrition ([place])   . Abnormality of gait   . Leg edema   . Hip fracture ([place]) 03/30/2017  . Leukocytosis 03/29/2017  . Mild intermittent asthma with acute exacerbation 01/29/2017  . Chronic renal insufficiency 09/22/2015  . Tachycardia 09/22/2015  . PVC (premature ventricular contraction) 09/22/2015  . Osteoporosis 06/03/2014  . Basal cell carcinoma of face 06/03/2014  . Benign prostatic hyperplasia 05/27/2013  . Eczema 04/23/2012  . Dyslipidemia 04/23/2012   Past Medical History:  Diagnosis Date  . Allergy    seasonal  fall  . Cancer ([place])    Basal cell carcinoma; multiple  . Eczema   . Hyperlipidemia   . Hypertension   . Osteoporosis   . Stroke [facility])    Past Surgical History:  Procedure  Laterality Date  . CATARACT EXTRACTION, BILATERAL  06/29/2015  . INTRAMEDULLARY (IM) NAIL INTERTROCHANTERIC Left 03/30/2017   Procedure: INTRAMEDULLARY (IM) NAIL INTERTROCHANTRIC;  Surgeon: [name], MD;  Location: [place];  Service: Orthopedics;  Laterality: Left;  . INTRAMEDULLARY (IM) NAIL INTERTROCHANTERIC Right 11/12/2018   Procedure: INTRAMEDULLARY (IM) NAIL RIGHT INTERTROCHANTERIC HIP FRACTURE;  Surgeon: [name], MD;  Location: [place];  Service:  Orthopedics;  Laterality: Right;  . MOHS SURGERY    . TONSILLECTOMY  1940 maybe   Allergies  Allergen Reactions  . Seasonal Ic [Cholestatin]    Prior to Admission medications   Medication Sig Start Date End Date Taking? Authorizing Provider  albuterol (PROVENTIL HFA;VENTOLIN HFA) 108 (90 Base) MCG/ACT inhaler Inhale 2 puffs into the lungs every 6 (six) hours as needed for wheezing or shortness of breath. 09/29/18  Yes Forrest Moron, MD  alendronate (FOSAMAX) 70 MG tablet Take 1 tablet (70 mg total) by mouth every 7 (seven) days. Take with a full glass of water on an empty stomach. 07/27/18  Yes Rutherford Guys, MD  aspirin EC 81 MG EC tablet Take 1 tablet (81 mg total) by mouth daily. 01/21/18  Yes Velvet Bathe, MD  clobetasol ointment (TEMOVATE) 6.43 % Apply 1 application topically 2 (two) times daily. 07/27/18  Yes Rutherford Guys, MD  enoxaparin (LOVENOX) 40 MG/0.4ML injection Inject 0.4 mLs (40 mg total) into the skin daily. 11/12/18  Yes [name], MD  feeding supplement, ENSURE ENLIVE, (ENSURE ENLIVE) LIQD Take 237 mLs by mouth 2 (two) times daily between meals. 11/18/18  Yes Dana Allan I, MD  metoprolol succinate (TOPROL-XL) 50 MG 24 hr tablet Take 1 tablet (50 mg total) by mouth daily. Take with or immediately following a meal. 07/27/18  Yes Rutherford Guys, MD  Misc. Devices [facility]) MISC Use walker whenever walking, standard walker 07/27/18  Yes Rutherford Guys, MD  montelukast (SINGULAIR) 10 MG tablet Take 1 tablet (10 mg total) by mouth at bedtime. 01/13/19  Yes Rutherford Guys, MD  polyethylene glycol [facility] Raveling) packet Take 17 g by mouth daily as needed for mild constipation. 11/17/18  Yes Dana Allan I, MD  pravastatin (PRAVACHOL) 40 MG tablet Take 1 tablet (40 mg total) by mouth daily. 07/27/18  Yes Rutherford Guys, MD  senna-docusate (SENOKOT-S) 8.6-50 MG tablet Take 2 tablets by mouth 2 (two) times daily. 11/17/18  Yes Bonnell Public, MD  Skin  Protectants, Misc. (EUCERIN) cream Apply 1 application topically as needed for dry skin. 02/18/18  Yes Wardell Honour, MD   Social History   Socioeconomic History  . Marital status: Married    Spouse name: Not on file  . Number of children: 2  . Years of education: Not on file  . Highest education level: Not on file  Occupational History  . Occupation: retired  Scientific laboratory technician  . Financial resource strain: Not on file  . Food insecurity:    Worry: Not on file    Inability: Not on file  . Transportation needs:    Medical: Not on file    Non-medical: Not on file  Tobacco Use  . Smoking status: Never Smoker  . Smokeless tobacco: Never Used  Substance and Sexual Activity  . Alcohol use: No    Comment: occas  . Drug use: No  . Sexual activity: Not Currently  Lifestyle  . Physical activity:    Days per week: Not on file  Minutes per session: Not on file  . Stress: Not on file  Relationships  . Social connections:    Talks on phone: Not on file    Gets together: Not on file    Attends religious service: Not on file    Active member of club or organization: Not on file    Attends meetings of clubs or organizations: Not on file    Relationship status: Not on file  . Intimate partner violence:    Fear of current or ex partner: Not on file    Emotionally abused: Not on file    Physically abused: Not on file    Forced sexual activity: Not on file  Other Topics Concern  . Not on file  Social History Narrative   Marital status: divorced; good friend with ex-wife; not dating      Children: 2 children; 4 grandchildren; no gg      Lives:  Alone in house; daughter six blocks away      Employment: college professor; retired in 1997; history; [place]:  Never      Alcohol: tequila loves but won't buy it.      Exercise:  As much as can; previous competitive biking.      ADLs: quit mowing grass in 2017; no assistant devices; cooks and cleans and pays bills.  No  driving; HAS NEVER DRIVEN; has always ridden bike.  Daughter takes patient to grocery store and to appointments.  Daughter drives patient to grocery store.      Advanced Directives:  No living; DNR; do not resuscitate.  HCPOA: [name] Cummings     Observations/Objective: Most of visit with hx provided by caretaker. Few responses form Mr. Augustus without apparent distress.   Assessment and Plan: Wheezing - Plan: fluticasone (FLOVENT HFA) 110 MCG/ACT inhaler  Mild intermittent asthma with acute exacerbation - Plan: fluticasone (FLOVENT HFA) 110 MCG/ACT inhaler  Possible flare of asthma versus upper airway component.  Previous concerns of wheezing with an office eval in March was overall reassuring, as well as chest x-ray at that time.  No fevers, and even with reported dyspnea O2 sat 93% reported by therapist with activity.  -Treatment options discussed with caregiver, including relative risks of in office evaluation given age and comorbidities.  Will try initial Flovent inhaler 110 mcg twice daily for the next 24 to 48 hours with continued use of albuterol as needed  -If not showing significant improvement in the next 24 to 48 hours, recommended face-to-face visit with provider to evaluate lungs, need for possible oral prednisone or imaging.   -ER precautions discussed if any increased respiratory effort, or distress in the meantime.  Understanding expressed.   Follow Up Instructions: In office eval in next 1-2 days if not improving. ER if worse sooner.   Patient Instructions  Try flovent inhaler twice per day for possible asthma, albuterol still as needed, but needs to be checked in office if not improving in next 24-48 hrs. If any worse symptoms or respiratory distress to be seen in ER.    I discussed the assessment and treatment plan with the patient. The patient was provided an opportunity to ask questions and all were answered. The patient agreed with the plan and demonstrated an  understanding of the instructions.   The patient was advised to call back or seek an in-person evaluation if the symptoms worsen or if the condition fails to improve as anticipated.  I provided 12  minutes of non-face-to-face time during this encounter.  Signed,   Merri Ray, MD Primary Care at [place].  02/03/19

## 2019-02-03 NOTE — Patient Instructions (Addendum)
Try flovent inhaler twice per day for possible asthma, albuterol still as needed, but needs to be checked in office if not improving in next 24-48 hrs. If any worse symptoms or respiratory distress to be seen in ER.    Return to the clinic or go to the nearest emergency room if any of your symptoms worsen or new symptoms occur.

## 2019-02-03 NOTE — Progress Notes (Signed)
Spoke with [name] in-home Aid  today about concerns and she states pt has been having really loud Wheezing x 3 weeks. She states about 2 days ago he started  complaining of SHOB. She states he was evaluated for pneumonia in our office 01/13/2019. After his OV he was doing ok but then the wheezing started. She states he has had no other symptom, no cough,nasal congestion,no chest pain. She states his tempeture has been fine just concerned about the wheezing.

## 2019-02-04 ENCOUNTER — Ambulatory Visit (INDEPENDENT_AMBULATORY_CARE_PROVIDER_SITE_OTHER): Payer: Medicare Other

## 2019-02-04 ENCOUNTER — Encounter (INDEPENDENT_AMBULATORY_CARE_PROVIDER_SITE_OTHER): Payer: Self-pay | Admitting: Orthopaedic Surgery

## 2019-02-04 ENCOUNTER — Other Ambulatory Visit: Payer: Self-pay

## 2019-02-04 ENCOUNTER — Ambulatory Visit (INDEPENDENT_AMBULATORY_CARE_PROVIDER_SITE_OTHER): Payer: Medicare Other | Admitting: Physician Assistant

## 2019-02-04 DIAGNOSIS — Z8673 Personal history of transient ischemic attack (TIA), and cerebral infarction without residual deficits: Secondary | ICD-10-CM | POA: Diagnosis not present

## 2019-02-04 DIAGNOSIS — M810 Age-related osteoporosis without current pathological fracture: Secondary | ICD-10-CM | POA: Diagnosis not present

## 2019-02-04 DIAGNOSIS — I10 Essential (primary) hypertension: Secondary | ICD-10-CM | POA: Diagnosis not present

## 2019-02-04 DIAGNOSIS — S72351D Displaced comminuted fracture of shaft of right femur, subsequent encounter for closed fracture with routine healing: Secondary | ICD-10-CM | POA: Diagnosis not present

## 2019-02-04 DIAGNOSIS — M25551 Pain in right hip: Secondary | ICD-10-CM

## 2019-02-04 DIAGNOSIS — J4520 Mild intermittent asthma, uncomplicated: Secondary | ICD-10-CM | POA: Diagnosis not present

## 2019-02-04 DIAGNOSIS — E440 Moderate protein-calorie malnutrition: Secondary | ICD-10-CM | POA: Diagnosis not present

## 2019-02-04 NOTE — Progress Notes (Signed)
Post-Op Visit Note   Patient: [name]           Date of Birth: Sep 28, 1932           MRN: [id number] Visit Date: 02/04/2019 PCP: Rutherford Guys, MD   Assessment & Plan:  Chief Complaint:  Chief Complaint  Patient presents with  . Right Hip - Follow-up, Routine Post Op   Visit Diagnoses:  1. Pain in right hip     Plan: Patient is a pleasant 85 year old gentleman who presents our clinic today 84 days status post right hip intramedullary nail, date of surgery 11/12/2018.  He has been living at home alone.  He is getting home health physical therapy.  He is ambulating with a walker.  He denies any pain to the hip, but does note some pain to the knee.  No fevers or chills.  Overall doing well.  Examination of his right hip reveals fully healed surgical scars.  He has fairly good range of motion and strength throughout the right hip.  Calf is soft and nontender.  He is neurovascularly intact distally.  At this point, would like for him to continue with his home exercise program and home health physical therapy.  He will follow-up with [place] in 6 weeks time for repeat evaluation and x-rays.  Follow-Up Instructions: Return in about 6 weeks (around 03/18/2019).   Orders:  Orders Placed This Encounter  Procedures  . XR HIP UNILAT W OR W/O PELVIS 2-3 VIEWS RIGHT   No orders of the defined types were placed in this encounter.   Imaging: Xr Hip Unilat W Or W/o Pelvis 2-3 Views Right  Result Date: 02/04/2019 X-rays demonstrate stable alignment of the fracture and hardware without complication   PMFS History: Patient Active Problem List   Diagnosis Date Noted  . Impaired mobility 01/13/2019  . Seasonal allergies 01/13/2019  . Hiatal hernia 01/13/2019  . Pain in right hip 12/24/2018  . S/P right hip fracture 11/11/2018  . Recurrent falls 05/12/2018  . History of CVA (cerebrovascular accident) 01/16/2018  . Nocturnal enuresis   . Fall   . Full incontinence of feces   . Essential  hypertension   . Hypoalbuminemia due to protein-calorie malnutrition ([place])   . Abnormality of gait   . Leg edema   . Hip fracture ([place]) 03/30/2017  . Leukocytosis 03/29/2017  . Mild intermittent asthma with acute exacerbation 01/29/2017  . Chronic renal insufficiency 09/22/2015  . Tachycardia 09/22/2015  . PVC (premature ventricular contraction) 09/22/2015  . Osteoporosis 06/03/2014  . Basal cell carcinoma of face 06/03/2014  . Benign prostatic hyperplasia 05/27/2013  . Eczema 04/23/2012  . Dyslipidemia 04/23/2012   Past Medical History:  Diagnosis Date  . Allergy    seasonal  fall  . Cancer ([place])    Basal cell carcinoma; multiple  . Eczema   . Hyperlipidemia   . Hypertension   . Osteoporosis   . Stroke [facility])     Family History  Problem Relation Age of Onset  . Arthritis Mother   . Heart disease Mother 89  . Hypertension Father     Past Surgical History:  Procedure Laterality Date  . CATARACT EXTRACTION, BILATERAL  06/29/2015  . INTRAMEDULLARY (IM) NAIL INTERTROCHANTERIC Left 03/30/2017   Procedure: INTRAMEDULLARY (IM) NAIL INTERTROCHANTRIC;  Surgeon: [name], MD;  Location: [facility];  Service: Orthopedics;  Laterality: Left;  . INTRAMEDULLARY (IM) NAIL INTERTROCHANTERIC Right 11/12/2018   Procedure: INTRAMEDULLARY (IM) NAIL RIGHT INTERTROCHANTERIC HIP FRACTURE;  Surgeon: [name], MD;  Location: [place];  Service: Orthopedics;  Laterality: Right;  . MOHS SURGERY    . TONSILLECTOMY  1940 maybe   Social History   Occupational History  . Occupation: retired  Tobacco Use  . Smoking status: Never Smoker  . Smokeless tobacco: Never Used  Substance and Sexual Activity  . Alcohol use: No    Comment: occas  . Drug use: No  . Sexual activity: Not Currently

## 2019-02-05 MED ORDER — BECLOMETHASONE DIPROP HFA 40 MCG/ACT IN AERB: 1.0000 | INHALATION_SPRAY | Freq: Two times a day (BID) | RESPIRATORY_TRACT | 2 refills | Status: DC

## 2019-02-05 NOTE — Telephone Encounter (Signed)
Please send in another inhaler if possible.

## 2019-02-05 NOTE — Telephone Encounter (Signed)
Please let them know that I sent a prescription for qvar instead. thanks

## 2019-02-05 NOTE — Telephone Encounter (Signed)
Spoke with daughter and verbalized understanding of new rx.

## 2019-02-08 ENCOUNTER — Telehealth (INDEPENDENT_AMBULATORY_CARE_PROVIDER_SITE_OTHER): Payer: Self-pay | Admitting: Orthopaedic Surgery

## 2019-02-08 DIAGNOSIS — E440 Moderate protein-calorie malnutrition: Secondary | ICD-10-CM | POA: Diagnosis not present

## 2019-02-08 DIAGNOSIS — J4520 Mild intermittent asthma, uncomplicated: Secondary | ICD-10-CM | POA: Diagnosis not present

## 2019-02-08 DIAGNOSIS — Z8673 Personal history of transient ischemic attack (TIA), and cerebral infarction without residual deficits: Secondary | ICD-10-CM | POA: Diagnosis not present

## 2019-02-08 DIAGNOSIS — M810 Age-related osteoporosis without current pathological fracture: Secondary | ICD-10-CM | POA: Diagnosis not present

## 2019-02-08 DIAGNOSIS — S72351D Displaced comminuted fracture of shaft of right femur, subsequent encounter for closed fracture with routine healing: Secondary | ICD-10-CM | POA: Diagnosis not present

## 2019-02-08 DIAGNOSIS — I10 Essential (primary) hypertension: Secondary | ICD-10-CM | POA: Diagnosis not present

## 2019-02-08 NOTE — Telephone Encounter (Signed)
Betsy from [facility] called requesting VO for Nurse Evaluation.  CB#[phone].  Thank you.

## 2019-02-09 NOTE — Telephone Encounter (Signed)
Yes approve

## 2019-02-09 NOTE — Telephone Encounter (Signed)
See message.

## 2019-02-09 NOTE — Telephone Encounter (Signed)
Called to approve orders no answer LMOM.

## 2019-02-10 DIAGNOSIS — M810 Age-related osteoporosis without current pathological fracture: Secondary | ICD-10-CM | POA: Diagnosis not present

## 2019-02-10 DIAGNOSIS — E440 Moderate protein-calorie malnutrition: Secondary | ICD-10-CM | POA: Diagnosis not present

## 2019-02-10 DIAGNOSIS — J4520 Mild intermittent asthma, uncomplicated: Secondary | ICD-10-CM | POA: Diagnosis not present

## 2019-02-10 DIAGNOSIS — Z8673 Personal history of transient ischemic attack (TIA), and cerebral infarction without residual deficits: Secondary | ICD-10-CM | POA: Diagnosis not present

## 2019-02-10 DIAGNOSIS — S72351D Displaced comminuted fracture of shaft of right femur, subsequent encounter for closed fracture with routine healing: Secondary | ICD-10-CM | POA: Diagnosis not present

## 2019-02-10 DIAGNOSIS — I10 Essential (primary) hypertension: Secondary | ICD-10-CM | POA: Diagnosis not present

## 2019-02-15 DIAGNOSIS — E440 Moderate protein-calorie malnutrition: Secondary | ICD-10-CM | POA: Diagnosis not present

## 2019-02-15 DIAGNOSIS — Z8673 Personal history of transient ischemic attack (TIA), and cerebral infarction without residual deficits: Secondary | ICD-10-CM | POA: Diagnosis not present

## 2019-02-15 DIAGNOSIS — M810 Age-related osteoporosis without current pathological fracture: Secondary | ICD-10-CM | POA: Diagnosis not present

## 2019-02-15 DIAGNOSIS — S72351D Displaced comminuted fracture of shaft of right femur, subsequent encounter for closed fracture with routine healing: Secondary | ICD-10-CM | POA: Diagnosis not present

## 2019-02-15 DIAGNOSIS — I10 Essential (primary) hypertension: Secondary | ICD-10-CM | POA: Diagnosis not present

## 2019-02-15 DIAGNOSIS — J4520 Mild intermittent asthma, uncomplicated: Secondary | ICD-10-CM | POA: Diagnosis not present

## 2019-02-16 DIAGNOSIS — Z8673 Personal history of transient ischemic attack (TIA), and cerebral infarction without residual deficits: Secondary | ICD-10-CM | POA: Diagnosis not present

## 2019-02-16 DIAGNOSIS — S72351D Displaced comminuted fracture of shaft of right femur, subsequent encounter for closed fracture with routine healing: Secondary | ICD-10-CM | POA: Diagnosis not present

## 2019-02-16 DIAGNOSIS — M810 Age-related osteoporosis without current pathological fracture: Secondary | ICD-10-CM | POA: Diagnosis not present

## 2019-02-16 DIAGNOSIS — E440 Moderate protein-calorie malnutrition: Secondary | ICD-10-CM | POA: Diagnosis not present

## 2019-02-16 DIAGNOSIS — J4520 Mild intermittent asthma, uncomplicated: Secondary | ICD-10-CM | POA: Diagnosis not present

## 2019-02-16 DIAGNOSIS — I10 Essential (primary) hypertension: Secondary | ICD-10-CM | POA: Diagnosis not present

## 2019-02-17 DIAGNOSIS — Z8673 Personal history of transient ischemic attack (TIA), and cerebral infarction without residual deficits: Secondary | ICD-10-CM | POA: Diagnosis not present

## 2019-02-17 DIAGNOSIS — M810 Age-related osteoporosis without current pathological fracture: Secondary | ICD-10-CM | POA: Diagnosis not present

## 2019-02-17 DIAGNOSIS — I10 Essential (primary) hypertension: Secondary | ICD-10-CM | POA: Diagnosis not present

## 2019-02-17 DIAGNOSIS — S72351D Displaced comminuted fracture of shaft of right femur, subsequent encounter for closed fracture with routine healing: Secondary | ICD-10-CM | POA: Diagnosis not present

## 2019-02-17 DIAGNOSIS — E440 Moderate protein-calorie malnutrition: Secondary | ICD-10-CM | POA: Diagnosis not present

## 2019-02-17 DIAGNOSIS — J4520 Mild intermittent asthma, uncomplicated: Secondary | ICD-10-CM | POA: Diagnosis not present

## 2019-02-22 DIAGNOSIS — I10 Essential (primary) hypertension: Secondary | ICD-10-CM | POA: Diagnosis not present

## 2019-02-22 DIAGNOSIS — J4520 Mild intermittent asthma, uncomplicated: Secondary | ICD-10-CM | POA: Diagnosis not present

## 2019-02-22 DIAGNOSIS — S72351D Displaced comminuted fracture of shaft of right femur, subsequent encounter for closed fracture with routine healing: Secondary | ICD-10-CM | POA: Diagnosis not present

## 2019-02-22 DIAGNOSIS — Z8673 Personal history of transient ischemic attack (TIA), and cerebral infarction without residual deficits: Secondary | ICD-10-CM | POA: Diagnosis not present

## 2019-02-22 DIAGNOSIS — M810 Age-related osteoporosis without current pathological fracture: Secondary | ICD-10-CM | POA: Diagnosis not present

## 2019-02-22 DIAGNOSIS — E440 Moderate protein-calorie malnutrition: Secondary | ICD-10-CM | POA: Diagnosis not present

## 2019-02-24 DIAGNOSIS — S72351D Displaced comminuted fracture of shaft of right femur, subsequent encounter for closed fracture with routine healing: Secondary | ICD-10-CM | POA: Diagnosis not present

## 2019-02-24 DIAGNOSIS — E440 Moderate protein-calorie malnutrition: Secondary | ICD-10-CM | POA: Diagnosis not present

## 2019-02-24 DIAGNOSIS — Z8673 Personal history of transient ischemic attack (TIA), and cerebral infarction without residual deficits: Secondary | ICD-10-CM | POA: Diagnosis not present

## 2019-02-24 DIAGNOSIS — I10 Essential (primary) hypertension: Secondary | ICD-10-CM | POA: Diagnosis not present

## 2019-02-24 DIAGNOSIS — M810 Age-related osteoporosis without current pathological fracture: Secondary | ICD-10-CM | POA: Diagnosis not present

## 2019-02-24 DIAGNOSIS — J4520 Mild intermittent asthma, uncomplicated: Secondary | ICD-10-CM | POA: Diagnosis not present

## 2019-03-01 ENCOUNTER — Telehealth: Payer: Self-pay | Admitting: Orthopaedic Surgery

## 2019-03-01 DIAGNOSIS — I10 Essential (primary) hypertension: Secondary | ICD-10-CM | POA: Diagnosis not present

## 2019-03-01 DIAGNOSIS — J4520 Mild intermittent asthma, uncomplicated: Secondary | ICD-10-CM | POA: Diagnosis not present

## 2019-03-01 DIAGNOSIS — M810 Age-related osteoporosis without current pathological fracture: Secondary | ICD-10-CM | POA: Diagnosis not present

## 2019-03-01 DIAGNOSIS — S72351D Displaced comminuted fracture of shaft of right femur, subsequent encounter for closed fracture with routine healing: Secondary | ICD-10-CM | POA: Diagnosis not present

## 2019-03-01 DIAGNOSIS — E440 Moderate protein-calorie malnutrition: Secondary | ICD-10-CM | POA: Diagnosis not present

## 2019-03-01 DIAGNOSIS — Z8673 Personal history of transient ischemic attack (TIA), and cerebral infarction without residual deficits: Secondary | ICD-10-CM | POA: Diagnosis not present

## 2019-03-01 NOTE — Telephone Encounter (Signed)
Betsy with [facility] called in to get verbal orders to continue physical therapy 1 time a week for 1 week and 2 times a week for 4 weeks. [phone] OKAY to lvm

## 2019-03-01 NOTE — Telephone Encounter (Signed)
Called Betsy to approve orders

## 2019-03-03 DIAGNOSIS — M810 Age-related osteoporosis without current pathological fracture: Secondary | ICD-10-CM | POA: Diagnosis not present

## 2019-03-03 DIAGNOSIS — Z8673 Personal history of transient ischemic attack (TIA), and cerebral infarction without residual deficits: Secondary | ICD-10-CM | POA: Diagnosis not present

## 2019-03-03 DIAGNOSIS — S72351D Displaced comminuted fracture of shaft of right femur, subsequent encounter for closed fracture with routine healing: Secondary | ICD-10-CM | POA: Diagnosis not present

## 2019-03-03 DIAGNOSIS — I10 Essential (primary) hypertension: Secondary | ICD-10-CM | POA: Diagnosis not present

## 2019-03-03 DIAGNOSIS — E440 Moderate protein-calorie malnutrition: Secondary | ICD-10-CM | POA: Diagnosis not present

## 2019-03-03 DIAGNOSIS — W19XXXD Unspecified fall, subsequent encounter: Secondary | ICD-10-CM | POA: Diagnosis not present

## 2019-03-03 DIAGNOSIS — J4520 Mild intermittent asthma, uncomplicated: Secondary | ICD-10-CM | POA: Diagnosis not present

## 2019-03-07 ENCOUNTER — Emergency Department (HOSPITAL_COMMUNITY): Payer: Medicare Other

## 2019-03-07 ENCOUNTER — Observation Stay (HOSPITAL_COMMUNITY)
Admission: EM | Admit: 2019-03-07 | Discharge: 2019-03-09 | Disposition: A | Discharge status: Home / Self Care | Payer: Medicare Other | Attending: Internal Medicine | Admitting: Internal Medicine

## 2019-03-07 ENCOUNTER — Encounter (HOSPITAL_COMMUNITY): Payer: Self-pay | Admitting: Emergency Medicine

## 2019-03-07 ENCOUNTER — Other Ambulatory Visit: Payer: Self-pay

## 2019-03-07 DIAGNOSIS — R41841 Cognitive communication deficit: Secondary | ICD-10-CM | POA: Insufficient documentation

## 2019-03-07 DIAGNOSIS — G459 Transient cerebral ischemic attack, unspecified: Principal | ICD-10-CM | POA: Insufficient documentation

## 2019-03-07 DIAGNOSIS — N189 Chronic kidney disease, unspecified: Secondary | ICD-10-CM | POA: Diagnosis present

## 2019-03-07 DIAGNOSIS — R0902 Hypoxemia: Secondary | ICD-10-CM | POA: Diagnosis not present

## 2019-03-07 DIAGNOSIS — Z8673 Personal history of transient ischemic attack (TIA), and cerebral infarction without residual deficits: Secondary | ICD-10-CM | POA: Insufficient documentation

## 2019-03-07 DIAGNOSIS — Z03818 Encounter for observation for suspected exposure to other biological agents ruled out: Secondary | ICD-10-CM | POA: Insufficient documentation

## 2019-03-07 DIAGNOSIS — R569 Unspecified convulsions: Secondary | ICD-10-CM | POA: Diagnosis present

## 2019-03-07 DIAGNOSIS — E785 Hyperlipidemia, unspecified: Secondary | ICD-10-CM | POA: Diagnosis present

## 2019-03-07 DIAGNOSIS — I10 Essential (primary) hypertension: Secondary | ICD-10-CM | POA: Diagnosis not present

## 2019-03-07 DIAGNOSIS — Z85828 Personal history of other malignant neoplasm of skin: Secondary | ICD-10-CM | POA: Insufficient documentation

## 2019-03-07 DIAGNOSIS — Z79899 Other long term (current) drug therapy: Secondary | ICD-10-CM | POA: Diagnosis not present

## 2019-03-07 DIAGNOSIS — R4702 Dysphasia: Secondary | ICD-10-CM | POA: Diagnosis not present

## 2019-03-07 DIAGNOSIS — R531 Weakness: Secondary | ICD-10-CM | POA: Diagnosis not present

## 2019-03-07 DIAGNOSIS — M810 Age-related osteoporosis without current pathological fracture: Secondary | ICD-10-CM | POA: Diagnosis present

## 2019-03-07 DIAGNOSIS — Z7409 Other reduced mobility: Secondary | ICD-10-CM | POA: Diagnosis present

## 2019-03-07 DIAGNOSIS — E871 Hypo-osmolality and hyponatremia: Secondary | ICD-10-CM | POA: Diagnosis present

## 2019-03-07 DIAGNOSIS — R404 Transient alteration of awareness: Secondary | ICD-10-CM | POA: Diagnosis not present

## 2019-03-07 DIAGNOSIS — Z7982 Long term (current) use of aspirin: Secondary | ICD-10-CM | POA: Diagnosis not present

## 2019-03-07 DIAGNOSIS — S72009A Fracture of unspecified part of neck of unspecified femur, initial encounter for closed fracture: Secondary | ICD-10-CM | POA: Diagnosis present

## 2019-03-07 DIAGNOSIS — R4701 Aphasia: Secondary | ICD-10-CM | POA: Diagnosis not present

## 2019-03-07 DIAGNOSIS — M6281 Muscle weakness (generalized): Secondary | ICD-10-CM | POA: Insufficient documentation

## 2019-03-07 DIAGNOSIS — R2689 Other abnormalities of gait and mobility: Secondary | ICD-10-CM | POA: Diagnosis not present

## 2019-03-07 LAB — CBC
HCT: 45.9 % (ref 39.0–52.0)
Hemoglobin: 15.5 g/dL (ref 13.0–17.0)
MCH: 30.5 pg (ref 26.0–34.0)
MCHC: 33.8 g/dL (ref 30.0–36.0)
MCV: 90.4 fL (ref 80.0–100.0)
Platelets: 209 10*3/uL (ref 150–400)
RBC: 5.08 MIL/uL (ref 4.22–5.81)
RDW: 12.6 % (ref 11.5–15.5)
WBC: 10 10*3/uL (ref 4.0–10.5)
nRBC: 0 % (ref 0.0–0.2)

## 2019-03-07 LAB — COMPREHENSIVE METABOLIC PANEL
ALT: 15 U/L (ref 0–44)
AST: 22 U/L (ref 15–41)
Albumin: 3.6 g/dL (ref 3.5–5.0)
Alkaline Phosphatase: 86 U/L (ref 38–126)
Anion gap: 10 (ref 5–15)
BUN: 13 mg/dL (ref 8–23)
CO2: 23 mmol/L (ref 22–32)
Calcium: 9 mg/dL (ref 8.9–10.3)
Chloride: 96 mmol/L — ABNORMAL LOW (ref 98–111)
Creatinine, Ser: 0.99 mg/dL (ref 0.61–1.24)
GFR calc Af Amer: >60 mL/min (ref >60)
GFR calc non Af Amer: >60 mL/min (ref >60)
Glucose, Bld: 96 mg/dL (ref 70–99)
Potassium: 4.9 mmol/L (ref 3.5–5.1)
Sodium: 129 mmol/L — ABNORMAL LOW (ref 135–145)
Total Bilirubin: 0.7 mg/dL (ref 0.3–1.2)
Total Protein: 6.8 g/dL (ref 6.5–8.1)

## 2019-03-07 LAB — PROTIME-INR
INR: 1.1 (ref 0.8–1.2)
Prothrombin Time: 13.6 seconds (ref 11.4–15.2)

## 2019-03-07 LAB — APTT: aPTT: 25 seconds (ref 24–36)

## 2019-03-07 LAB — DIFFERENTIAL
Abs Immature Granulocytes: 0.05 10*3/uL (ref 0.00–0.07)
Basophils Absolute: 0.1 10*3/uL (ref 0.0–0.1)
Basophils Relative: 1 %
Eosinophils Absolute: 0.5 10*3/uL (ref 0.0–0.5)
Eosinophils Relative: 5 %
Immature Granulocytes: 1 %
Lymphocytes Relative: 19 %
Lymphs Abs: 1.9 10*3/uL (ref 0.7–4.0)
Monocytes Absolute: 0.9 10*3/uL (ref 0.1–1.0)
Monocytes Relative: 9 %
Neutro Abs: 6.6 10*3/uL (ref 1.7–7.7)
Neutrophils Relative %: 65 %

## 2019-03-07 LAB — I-STAT CREATININE, ED: Creatinine, Ser: 1 mg/dL (ref 0.61–1.24)

## 2019-03-07 LAB — CBG MONITORING, ED: Glucose-Capillary: 104 mg/dL — ABNORMAL HIGH (ref 70–99)

## 2019-03-07 MED ORDER — SODIUM CHLORIDE 0.9% FLUSH
3.0000 mL | Freq: Once | INTRAVENOUS | Status: AC
  Administered 2019-03-08: 3 mL via INTRAVENOUS

## 2019-03-07 MED ORDER — IOHEXOL 350 MG/ML SOLN
75.0000 mL | Freq: Once | INTRAVENOUS | Status: AC | PRN
  Administered 2019-03-07: 75 mL via INTRAVENOUS

## 2019-03-07 NOTE — Consult Note (Signed)
Neurology Consultation  Reason for Consult: Code stroke for confusion and altered mental status Referring Physician: Dr. Cyril Mourning Ward  CC: Confusion, altered mental status, possible aphasia  History is obtained from: Chart, EMS, patient's daughter who is at bedside  HPI: [name] is a 85 y.o. male past medical history of hypertension, hyperlipidemia, right hip fracture with some right-sided weakness over the last 6 months, prior stroke in the right caudate head with no residual deficits, usual state of health till about 10 PM-requires 24/7 assistance after the hip fracture-when the caregiver noticed that he was acting confused and did not recognize the caregiver.  EMS was called.  He had difficulty finding words and was repeating a few words.  He was not able to name objects.  A code stroke was activated and he was brought into the emergency room.  Also reported by EMS with some right-sided weakness on the face. In the emergency room, patient was evaluated at the bridge, he appeared a little slow to respond to questions but his NIH stroke scale was only 1 for mild dysarthria. TPA was not given due to low stroke scale. CT head was negative for bleed.  CTA of the head and neck is negative for emergent large vessel occlusion-shows both carotid bifurcations with atherosclerotic disease 60% on the right and 50% on the left. He denies any preceding illness-no fevers, chills, cough, shortness of breath.  No loss of appetite.  No chest pain or palpitations.  No abdominal pain diarrhea constipation.  No frequent urination.  No foul-smelling urine.  No bleeding or clotting issues.  LKW: 10 PM on 03/07/2019 tpa given?: no, low NIH Premorbid modified Rankin scale (mRS): 3  ROS: ROS was performed and is negative except as noted in the HPI.   Past Medical History:  Diagnosis Date  . Allergy    seasonal  fall  . Cancer ([place])    Basal cell carcinoma; multiple  . Eczema   . Hyperlipidemia   .  Hypertension   . Osteoporosis   . Stroke [facility])     Family History  Problem Relation Age of Onset  . Arthritis Mother   . Heart disease Mother 57  . Hypertension Father    Social History:   reports that he has never smoked. He has never used smokeless tobacco. He reports that he does not drink alcohol or use drugs.  Medications  Current Facility-Administered Medications:  .  sodium chloride flush (NS) 0.9 % injection 3 mL, 3 mL, Intravenous, Once, Ward, Kristen N, DO  Current Outpatient Medications:  .  albuterol (PROVENTIL HFA;VENTOLIN HFA) 108 (90 Base) MCG/ACT inhaler, Inhale 2 puffs into the lungs every 6 (six) hours as needed for wheezing or shortness of breath., Disp: 1 Inhaler, Rfl: 0 .  alendronate (FOSAMAX) 70 MG tablet, Take 1 tablet (70 mg total) by mouth every 7 (seven) days. Take with a full glass of water on an empty stomach., Disp: 4 tablet, Rfl: 11 .  aspirin EC 81 MG EC tablet, Take 1 tablet (81 mg total) by mouth daily., Disp: 30 tablet, Rfl: 0 .  beclomethasone (QVAR REDIHALER) 40 MCG/ACT inhaler, Inhale 1 puff into the lungs 2 (two) times daily., Disp: 1 Inhaler, Rfl: 2 .  clobetasol ointment (TEMOVATE) 4.23 %, Apply 1 application topically 2 (two) times daily., Disp: 60 g, Rfl: prn .  enoxaparin (LOVENOX) 40 MG/0.4ML injection, Inject 0.4 mLs (40 mg total) into the skin daily., Disp: 14 Syringe, Rfl: 0 .  feeding supplement,  ENSURE ENLIVE, (ENSURE ENLIVE) LIQD, Take 237 mLs by mouth 2 (two) times daily between meals., Disp: 237 mL, Rfl: 12 .  metoprolol succinate (TOPROL-XL) 50 MG 24 hr tablet, Take 1 tablet (50 mg total) by mouth daily. Take with or immediately following a meal., Disp: 90 tablet, Rfl: 1 .  Misc. Devices (WALKER) MISC, Use walker whenever walking, standard walker, Disp: 1 each, Rfl: 0 .  montelukast (SINGULAIR) 10 MG tablet, Take 1 tablet (10 mg total) by mouth at bedtime., Disp: 30 tablet, Rfl: 3 .  polyethylene glycol (MIRALAX / GLYCOLAX) packet,  Take 17 g by mouth daily as needed for mild constipation., Disp: 14 each, Rfl: 0 .  pravastatin (PRAVACHOL) 40 MG tablet, Take 1 tablet (40 mg total) by mouth daily., Disp: 90 tablet, Rfl: 1 .  senna-docusate (SENOKOT-S) 8.6-50 MG tablet, Take 2 tablets by mouth 2 (two) times daily., Disp: 120 tablet, Rfl: 0 .  Skin Protectants, Misc. (EUCERIN) cream, Apply 1 application topically as needed for dry skin., Disp: 454 g, Rfl: 1  Exam: Current vital signs: BP (!) 170/101 (BP Location: Right Arm)   Pulse 77   Temp (!) 97.3 F (36.3 C) (Oral)   Resp 20   Wt 98.8 kg   SpO2 99%   BMI 31.25 kg/m  Vital signs in last 24 hours: Temp:  [96.8 F (36 C)-97.3 F (36.3 C)] 97.3 F (36.3 C) (05/10 2342) Pulse Rate:  [77-85] 77 (05/10 2342) Resp:  [19-20] 20 (05/10 2342) BP: (139-170)/(101-112) 170/101 (05/10 2342) SpO2:  [98 %-99 %] 99 % (05/10 2342) Weight:  [98.8 kg] 98.8 kg (05/10 2342) General: Patient is awake alert in no apparent distress HEENT: Normocephalic and atraumatic, dry mucous membranes, conjunctive are slightly flushed, no thyromegaly no lymphadenopathy CVS: S1-S2 heard, regular rate and rhythm Respiratory: Chest clear to auscultation Extremities: Warm well perfused, great toenails dry and chipping Neurological exam Patient is awake, alert, oriented to the month, and place-hospital although could not tell me the name of the hospital. His speech is mildly dysarthric. Naming is intact.  Comprehension is intact.  Repetition is intact.  He is fluent. Cranial nerves: Pupils are equal round reactive light, extraocular movements are intact, visual fields are full, no facial asymmetry, facial sensation intact, auditory acuity mildly decreased bilaterally, shoulder shrug intact, palate and tongue midline. Motor exam: He has 5/5 strength in both upper right and lower upper extremity.  His right lower extremity exam is limited by pain but he is able to raise right lower extremity above the  bed for 5 seconds without drift.  Left lower extremity is also 5/5 without drift. Sensory exam: Intact to light touch all over without extinction. Coordination: Intact finger-nose-finger.    NIH stroke scale-1  Labs I have reviewed labs in epic and the results pertinent to this consultation are: CBC    Component Value Date/Time   WBC 10.0 03/07/2019 2314   RBC 5.08 03/07/2019 2314   HGB 15.5 03/07/2019 2314   HGB 14.3 03/11/2018 1545   HCT 45.9 03/07/2019 2314   HCT 43.6 03/11/2018 1545   PLT 209 03/07/2019 2314   PLT 198 03/11/2018 1545   MCV 90.4 03/07/2019 2314   MCV 96 03/11/2018 1545   MCH 30.5 03/07/2019 2314   MCHC 33.8 03/07/2019 2314   RDW 12.6 03/07/2019 2314   RDW 12.7 03/11/2018 1545   LYMPHSABS 1.9 03/07/2019 2314   LYMPHSABS 1.5 03/11/2018 1545   MONOABS 0.9 03/07/2019 2314   EOSABS 0.5 03/07/2019  2314   EOSABS 1.1 (H) 03/11/2018 1545   BASOSABS 0.1 03/07/2019 2314   BASOSABS 0.0 03/11/2018 1545    CMP     Component Value Date/Time   NA 129 (L) 03/07/2019 2314   NA 142 03/11/2018 1545   K 4.9 03/07/2019 2314   CL 96 (L) 03/07/2019 2314   CO2 23 03/07/2019 2314   GLUCOSE 96 03/07/2019 2314   BUN 13 03/07/2019 2314   BUN 18 03/11/2018 1545   CREATININE 1.00 03/07/2019 2319   CREATININE 1.18 (H) 09/25/2016 1504   CALCIUM 9.0 03/07/2019 2314   PROT 6.8 03/07/2019 2314   PROT 6.6 03/11/2018 1545   ALBUMIN 3.6 03/07/2019 2314   ALBUMIN 3.9 03/11/2018 1545   AST 22 03/07/2019 2314   ALT 15 03/07/2019 2314   ALKPHOS 86 03/07/2019 2314   BILITOT 0.7 03/07/2019 2314   BILITOT 0.4 03/11/2018 1545   GFRNONAA >60 03/07/2019 2314   GFRNONAA 56 (L) 06/03/2014 1013   GFRAA >60 03/07/2019 2314   GFRAA 64 06/03/2014 1013   Imaging I have reviewed the images obtained:  CT-scan of the brain-no acute changes.  Aspects 10.  Chronic white matter disease.  CTA head and neck with no emergent LVO.  Bilateral carotid bifurcation 50 to 60% stenosis   Assessment:  86 year old man with above past medical history presented for sudden onset of difficulty comprehend speech and confusion. According to the EMTs, he also had some right facial droop and some aphasia. His symptoms continued to improve and on my examination he only had mild slurred speech-dysarthria-1 NIH stroke scale of 1. His daughter was at bedside and said that he appeared much better than what he was at home when EMS was called. I suspect that he has either some sort of toxic metabolic encephalopathy causing recrudescence of old symptoms or a small new stroke. His NIH stroke scale was very low to treat and hence TPA was not administered. He will be in the window for IV TPA till 2:30 AM on 03/08/2019.   Impression: Evaluate for stroke/TIA Evaluate for recrudescence of stroke symptoms Evaluate for toxic metabolic encephalopathy Low suspicion but cannot rule out seizures  Recommendations: -Monitor for being in the code stroke window till 2:30 AM. -Admit under observation -MRI of the brain to look for any evidence of an acute stroke -2D echocardiogram, hemoglobin A1c and fasting lipid panel. -Get a routine EEG in the morning. -He is currently on aspirin and statin-continue both of those. -Check urinalysis and chest x-ray -COVID-19 screen was negative, but he will be swabbed in accordance with the hospital policy of testing all patients will need admission.  Primary team to follow on that. -Maintain on telemetry -Frequent neurochecks -PT, OT, speech therapy -N.p.o. until cleared by bedside swallow evaluation or formal speech evaluation  -I spoke with the detail and the daughter at bedside.  I examined the patient with her daughter and she endorsed much improvement from baseline.  I had a detailed discussion about not giving him IV TPA at this time due to a very low stroke scale-NIH 1, which is nondisabling. She agreed. I did also inform her that he will be in the window for IV TPA till 2:30  AM and we will give her a call should anything change.  I have discussed the plan in detail with the patient's RN and will notify the EDP with my plan as well.  Stroke team will continue to follow -- Amie Portland, MD [facility] Pager: [phone]  If 7pm to 7am, please call on call as listed on AMION.

## 2019-03-07 NOTE — ED Triage Notes (Addendum)
Per EMS, called to home due to aphasia and altered mental status.  Per family pt is normally alert and active, does have hx of CVA but no deficits.  While in route, pt unable to identify some objects such as watch or ring. Pt is now able to follow commands and appears to be able to speak correctly.  Per EMS, daughter says pt is a DNR however she does not have the paperwork.   174/96 CBG112 Temp 97.7 95% on RA  Daughter confirmed pt is an DNR

## 2019-03-07 NOTE — ED Provider Notes (Signed)
TIME SEEN: 11:21 PM  CHIEF COMPLAINT: Code stroke  HPI: Patient is an 85 year old male with history of hypertension, hyperlipidemia, previous stroke on aspirin who presents to the emergency department with concern for possible stroke.  Last seen normal at 10:15 PM.  He lives at home with his daughter.  At that time he seemed confused and had a seizure.  EMS reports he is not able to identify appropriate objects and would repeat words.  No other focal neurologic deficits.  Blood sugar normal.  Blood pressure 170s/60s.  ROS: Level 5 caveat secondary to acuity  PAST MEDICAL HISTORY/PAST SURGICAL HISTORY:  Past Medical History:  Diagnosis Date  . Allergy    seasonal  fall  . Cancer ([place])    Basal cell carcinoma; multiple  . Eczema   . Hyperlipidemia   . Hypertension   . Osteoporosis   . Stroke [facility])     MEDICATIONS:  Prior to Admission medications   Medication Sig Start Date End Date Taking? Authorizing Provider  albuterol (PROVENTIL HFA;VENTOLIN HFA) 108 (90 Base) MCG/ACT inhaler Inhale 2 puffs into the lungs every 6 (six) hours as needed for wheezing or shortness of breath. 09/29/18   Forrest Moron, MD  alendronate (FOSAMAX) 70 MG tablet Take 1 tablet (70 mg total) by mouth every 7 (seven) days. Take with a full glass of water on an empty stomach. 07/27/18   Rutherford Guys, MD  aspirin EC 81 MG EC tablet Take 1 tablet (81 mg total) by mouth daily. 01/21/18   Velvet Bathe, MD  beclomethasone (QVAR REDIHALER) 40 MCG/ACT inhaler Inhale 1 puff into the lungs 2 (two) times daily. 02/05/19   Rutherford Guys, MD  clobetasol ointment (TEMOVATE) 7.37 % Apply 1 application topically 2 (two) times daily. 07/27/18   Rutherford Guys, MD  enoxaparin (LOVENOX) 40 MG/0.4ML injection Inject 0.4 mLs (40 mg total) into the skin daily. 11/12/18   [name], MD  feeding supplement, ENSURE ENLIVE, (ENSURE ENLIVE) LIQD Take 237 mLs by mouth 2 (two) times daily between meals. 11/18/18   Dana Allan  I, MD  metoprolol succinate (TOPROL-XL) 50 MG 24 hr tablet Take 1 tablet (50 mg total) by mouth daily. Take with or immediately following a meal. 07/27/18   Rutherford Guys, MD  Misc. Devices [facility]) MISC Use walker whenever walking, standard walker 07/27/18   Rutherford Guys, MD  montelukast (SINGULAIR) 10 MG tablet Take 1 tablet (10 mg total) by mouth at bedtime. 01/13/19   Rutherford Guys, MD  polyethylene glycol [facility]aveling) packet Take 17 g by mouth daily as needed for mild constipation. 11/17/18   Bonnell Public, MD  pravastatin (PRAVACHOL) 40 MG tablet Take 1 tablet (40 mg total) by mouth daily. 07/27/18   Rutherford Guys, MD  senna-docusate (SENOKOT-S) 8.6-50 MG tablet Take 2 tablets by mouth 2 (two) times daily. 11/17/18   Bonnell Public, MD  Skin Protectants, Misc. (EUCERIN) cream Apply 1 application topically as needed for dry skin. 02/18/18   Wardell Honour, MD    ALLERGIES:  Allergies  Allergen Reactions  . Seasonal Ic [Cholestatin]     SOCIAL HISTORY:  Social History   Tobacco Use  . Smoking status: Never Smoker  . Smokeless tobacco: Never Used  Substance Use Topics  . Alcohol use: No    Comment: occas    FAMILY HISTORY: Family History  Problem Relation Age of Onset  . Arthritis Mother   . Heart disease  Mother 34  . Hypertension Father     EXAM: BP (!) 170/101 (BP Location: Right Arm)   Pulse 77   Temp (!) 97.3 F (36.3 C) (Oral)   Resp 20   Wt 98.8 kg   SpO2 99%   BMI 31.25 kg/m  CONSTITUTIONAL: Alert and oriented x 3, elderly, in no distress HEAD: Normocephalic, appears atraumatic EYES: Conjunctivae clear, pupils appear equal, EOMI ENT: normal nose; moist mucous membranes NECK: Supple, no meningismus, no nuchal rigidity, no LAD  CARD: RRR; S1 and S2 appreciated; no murmurs, no clicks, no rubs, no gallops RESP: Normal chest excursion without splinting or tachypnea; breath sounds clear and equal bilaterally; no wheezes, no rhonchi, no  rales, no hypoxia or respiratory distress, speaking full sentences ABD/GI: Normal bowel sounds; non-distended; soft, non-tender, no rebound, no guarding, no peritoneal signs, no hepatosplenomegaly BACK:  The back appears normal and is non-tender to palpation, there is no CVA tenderness EXT: Normal ROM in all joints; non-tender to palpation; no edema; normal capillary refill; no cyanosis, no calf tenderness or swelling    SKIN: Normal color for age and race; warm; no rash NEURO: Moves all extremities equally, reports normal sensation diffusely without neglect, no pronator drift, cranial nerves II through XII intact, no aphasia but does have dysarthria, NIH stroke scale 1 PSYCH: The patient's mood and manner are appropriate. Grooming and personal hygiene are appropriate.  MEDICAL DECISION MAKING: Patient here as a code stroke.  It seems his symptoms have significantly improved.  He no longer has a aphasia.  Blood pressure slightly elevated with EMS.  Blood glucose normal.  Dr. Rory Percy with neurology at patient's bedside upon patient's arrival with EMS.  Patient will be taken emergently to head CT.  Will obtain labs, urine, EKG.  ED PROGRESS: Labs show mild hyponatremia of 129.  Otherwise unremarkable.  CT head shows no acute abnormality.    Patient has a stroke scale daughter reports that symptoms have significantly improved.  Concern for TIA versus encephalopathy versus seizure versus reemergence of old stroke symptoms.  Neurologist recommends admission to medicine.  Not a TPA candidate given symptoms significant  12:29 AM Discussed patient's case with hospitalist, Dr. Jonelle Sidle.  I have recommended admission and patient (and family if present) agree with this plan. Admitting physician will place admission orders.   I reviewed all nursing notes, vitals, pertinent previous records, EKGs, lab and urine results, imaging (as available).     EKG Interpretation  Date/Time:  Sunday Mar 07 2019 23:36:33  EDT Ventricular Rate:  84 PR Interval:    QRS Duration: 96 QT Interval:  418 QTC Calculation: 495 R Axis:   38 Text Interpretation:  Sinus rhythm Multiform ventricular premature complexes Borderline prolonged QT interval No significant change since last tracing Confirmed by Pryor Curia [phone]) on 03/07/2019 11:38:35 PM        CRITICAL CARE Performed by: Cyril Mourning Tania Perrott   Total critical care time: 45 minutes  Critical care time was exclusive of separately billable procedures and treating other patients.  Critical care was necessary to treat or prevent imminent or life-threatening deterioration.  Critical care was time spent personally by me on the following activities: development of treatment plan with patient and/or surrogate as well as nursing, discussions with consultants, evaluation of patient's response to treatment, examination of patient, obtaining history from patient or surrogate, ordering and performing treatments and interventions, ordering and review of laboratory studies, ordering and review of radiographic studies, pulse oximetry and re-evaluation of patient's condition.  Jamontae Thwaites, Delice Bison, DO 03/08/19 0030

## 2019-03-08 ENCOUNTER — Other Ambulatory Visit (HOSPITAL_COMMUNITY): Payer: Medicare Other

## 2019-03-08 ENCOUNTER — Observation Stay (HOSPITAL_COMMUNITY): Payer: Medicare Other

## 2019-03-08 ENCOUNTER — Encounter (HOSPITAL_COMMUNITY): Payer: Medicare Other

## 2019-03-08 ENCOUNTER — Observation Stay (HOSPITAL_BASED_OUTPATIENT_CLINIC_OR_DEPARTMENT_OTHER): Payer: Medicare Other

## 2019-03-08 DIAGNOSIS — I361 Nonrheumatic tricuspid (valve) insufficiency: Secondary | ICD-10-CM

## 2019-03-08 DIAGNOSIS — G459 Transient cerebral ischemic attack, unspecified: Secondary | ICD-10-CM | POA: Diagnosis not present

## 2019-03-08 DIAGNOSIS — R479 Unspecified speech disturbances: Secondary | ICD-10-CM | POA: Diagnosis not present

## 2019-03-08 DIAGNOSIS — E871 Hypo-osmolality and hyponatremia: Secondary | ICD-10-CM | POA: Diagnosis present

## 2019-03-08 DIAGNOSIS — R4701 Aphasia: Secondary | ICD-10-CM | POA: Diagnosis not present

## 2019-03-08 LAB — ECHOCARDIOGRAM LIMITED: Weight: 3485.03 oz

## 2019-03-08 LAB — LIPID PANEL
Cholesterol: 153 mg/dL (ref 0–200)
HDL: 66 mg/dL (ref >40)
LDL Cholesterol: 80 mg/dL (ref 0–99)
Total CHOL/HDL Ratio: 2.3 RATIO
Triglycerides: 36 mg/dL (ref <150)
VLDL: 7 mg/dL (ref 0–40)

## 2019-03-08 LAB — HEMOGLOBIN A1C
Hgb A1c MFr Bld: 5.6 % (ref 4.8–5.6)
Mean Plasma Glucose: 114.02 mg/dL

## 2019-03-08 LAB — CBC
HCT: 44.4 % (ref 39.0–52.0)
Hemoglobin: 15.3 g/dL (ref 13.0–17.0)
MCH: 30.4 pg (ref 26.0–34.0)
MCHC: 34.5 g/dL (ref 30.0–36.0)
MCV: 88.1 fL (ref 80.0–100.0)
Platelets: 212 10*3/uL (ref 150–400)
RBC: 5.04 MIL/uL (ref 4.22–5.81)
RDW: 12.7 % (ref 11.5–15.5)
WBC: 11.3 10*3/uL — ABNORMAL HIGH (ref 4.0–10.5)
nRBC: 0 % (ref 0.0–0.2)

## 2019-03-08 LAB — COMPREHENSIVE METABOLIC PANEL
ALT: 15 U/L (ref 0–44)
AST: 17 U/L (ref 15–41)
Albumin: 3.4 g/dL — ABNORMAL LOW (ref 3.5–5.0)
Alkaline Phosphatase: 80 U/L (ref 38–126)
Anion gap: 9 (ref 5–15)
BUN: 11 mg/dL (ref 8–23)
CO2: 28 mmol/L (ref 22–32)
Calcium: 9.1 mg/dL (ref 8.9–10.3)
Chloride: 94 mmol/L — ABNORMAL LOW (ref 98–111)
Creatinine, Ser: 1.04 mg/dL (ref 0.61–1.24)
GFR calc Af Amer: >60 mL/min (ref >60)
GFR calc non Af Amer: >60 mL/min (ref >60)
Glucose, Bld: 100 mg/dL — ABNORMAL HIGH (ref 70–99)
Potassium: 4.6 mmol/L (ref 3.5–5.1)
Sodium: 131 mmol/L — ABNORMAL LOW (ref 135–145)
Total Bilirubin: 0.7 mg/dL (ref 0.3–1.2)
Total Protein: 6.6 g/dL (ref 6.5–8.1)

## 2019-03-08 LAB — SARS CORONAVIRUS 2 BY RT PCR (HOSPITAL ORDER, PERFORMED IN ~~LOC~~ HOSPITAL LAB): SARS Coronavirus 2: NEGATIVE

## 2019-03-08 LAB — URINALYSIS, ROUTINE W REFLEX MICROSCOPIC
Bilirubin Urine: NEGATIVE
Glucose, UA: NEGATIVE mg/dL
Hgb urine dipstick: NEGATIVE
Ketones, ur: NEGATIVE mg/dL
Leukocytes,Ua: NEGATIVE
Nitrite: NEGATIVE
Protein, ur: NEGATIVE mg/dL
Specific Gravity, Urine: 1.023 (ref 1.005–1.030)
pH: 6 (ref 5.0–8.0)

## 2019-03-08 MED ORDER — ACETAMINOPHEN 160 MG/5ML PO SOLN: 650.0000 mg | ORAL | Status: DC | PRN

## 2019-03-08 MED ORDER — METOPROLOL SUCCINATE ER 25 MG PO TB24
50.0000 mg | ORAL_TABLET | Freq: Every day | ORAL | Status: DC
  Administered 2019-03-08 – 2019-03-09 (×2): 50 mg via ORAL
  Filled 2019-03-08 (×2): qty 2

## 2019-03-08 MED ORDER — BUDESONIDE 0.25 MG/2ML IN SUSP
0.2500 mg | Freq: Two times a day (BID) | RESPIRATORY_TRACT | Status: DC
  Administered 2019-03-08 – 2019-03-09 (×2): 0.25 mg via RESPIRATORY_TRACT
  Filled 2019-03-08 (×4): qty 2

## 2019-03-08 MED ORDER — SENNOSIDES-DOCUSATE SODIUM 8.6-50 MG PO TABS
2.0000 | ORAL_TABLET | Freq: Two times a day (BID) | ORAL | Status: DC
  Administered 2019-03-08 – 2019-03-09 (×3): 2 via ORAL
  Filled 2019-03-08 (×3): qty 2

## 2019-03-08 MED ORDER — ALBUTEROL SULFATE (2.5 MG/3ML) 0.083% IN NEBU: 2.5000 mg | INHALATION_SOLUTION | Freq: Four times a day (QID) | RESPIRATORY_TRACT | Status: DC | PRN

## 2019-03-08 MED ORDER — MONTELUKAST SODIUM 10 MG PO TABS
10.0000 mg | ORAL_TABLET | Freq: Every day | ORAL | Status: DC
  Administered 2019-03-08: 10 mg via ORAL
  Filled 2019-03-08: qty 1

## 2019-03-08 MED ORDER — ENSURE ENLIVE PO LIQD
237.0000 mL | Freq: Two times a day (BID) | ORAL | Status: DC
  Administered 2019-03-09: 237 mL via ORAL

## 2019-03-08 MED ORDER — PRAVASTATIN SODIUM 40 MG PO TABS
40.0000 mg | ORAL_TABLET | Freq: Every day | ORAL | Status: DC
  Administered 2019-03-08 – 2019-03-09 (×2): 40 mg via ORAL
  Filled 2019-03-08 (×2): qty 1

## 2019-03-08 MED ORDER — ASPIRIN 300 MG RE SUPP: 300.0000 mg | Freq: Every day | RECTAL | Status: DC

## 2019-03-08 MED ORDER — ASPIRIN 325 MG PO TABS
325.0000 mg | ORAL_TABLET | Freq: Every day | ORAL | Status: DC
  Administered 2019-03-08: 325 mg via ORAL
  Filled 2019-03-08: qty 1

## 2019-03-08 MED ORDER — POLYETHYLENE GLYCOL 3350 17 G PO PACK: 17.0000 g | PACK | Freq: Every day | ORAL | Status: DC | PRN

## 2019-03-08 MED ORDER — ASPIRIN EC 81 MG PO TBEC
81.0000 mg | DELAYED_RELEASE_TABLET | Freq: Every day | ORAL | Status: DC
  Administered 2019-03-08 – 2019-03-09 (×2): 81 mg via ORAL
  Filled 2019-03-08 (×2): qty 1

## 2019-03-08 MED ORDER — SENNOSIDES-DOCUSATE SODIUM 8.6-50 MG PO TABS: 1.0000 | ORAL_TABLET | Freq: Every evening | ORAL | Status: DC | PRN

## 2019-03-08 MED ORDER — CLOPIDOGREL BISULFATE 75 MG PO TABS
75.0000 mg | ORAL_TABLET | Freq: Every day | ORAL | Status: DC
  Administered 2019-03-08 – 2019-03-09 (×2): 75 mg via ORAL
  Filled 2019-03-08 (×2): qty 1

## 2019-03-08 MED ORDER — ENOXAPARIN SODIUM 40 MG/0.4ML ~~LOC~~ SOLN
40.0000 mg | Freq: Every day | SUBCUTANEOUS | Status: DC
  Administered 2019-03-08 – 2019-03-09 (×2): 40 mg via SUBCUTANEOUS
  Filled 2019-03-08 (×2): qty 0.4

## 2019-03-08 MED ORDER — ACETAMINOPHEN 650 MG RE SUPP: 650.0000 mg | RECTAL | Status: DC | PRN

## 2019-03-08 MED ORDER — ACETAMINOPHEN 325 MG PO TABS: 650.0000 mg | ORAL_TABLET | ORAL | Status: DC | PRN

## 2019-03-08 MED ORDER — SODIUM CHLORIDE 0.9 % IV SOLN
INTRAVENOUS | Status: DC
  Administered 2019-03-08: 05:00:00 via INTRAVENOUS

## 2019-03-08 MED ORDER — DOCUSATE SODIUM 100 MG PO CAPS
100.0000 mg | ORAL_CAPSULE | Freq: Every day | ORAL | Status: DC
  Administered 2019-03-08 – 2019-03-09 (×2): 100 mg via ORAL
  Filled 2019-03-08 (×2): qty 1

## 2019-03-08 MED ORDER — STROKE: EARLY STAGES OF RECOVERY BOOK
Freq: Once | Status: AC
  Administered 2019-03-08: 05:00:00
  Filled 2019-03-08: qty 1

## 2019-03-08 NOTE — Progress Notes (Signed)
Central Monitor called at 8:40, Pt w/ trigeminy.  Dr. Eliseo Squires made aware and is speaking with the patient.

## 2019-03-08 NOTE — ED Notes (Signed)
ED TO INPATIENT HANDOFF REPORT  ED Nurse Name and Phone #: Anderson Malta RN [phone]  S Name/Age/Gender [name] 85 y.o. male Room/Bed: TRACC/TRACC  Code Status   Code Status: Prior  Home/SNF/Other Home Patient oriented to: self, place, time and situation Is this baseline? Yes   Triage Complete: Triage complete  Chief Complaint Code Stroke  Triage Note Per EMS, called to home due to aphasia and altered mental status.  Per family pt is normally alert and active, does have hx of CVA but no deficits.  While in route, pt unable to identify some objects such as watch or ring. Pt is now able to follow commands and appears to be able to speak correctly.  Per EMS, daughter says pt is a DNR however she does not have the paperwork.   174/96 CBG112 Temp 97.7 95% on RA  Daughter confirmed pt is an DNR   Allergies Allergies  Allergen Reactions  . Seasonal Ic [Cholestatin]     Level of Care/Admitting Diagnosis ED Disposition    ED Disposition Condition [facility] Area: [place] [100100]  Level of Care: Telemetry Medical [104]  I expect the patient will be discharged within 24 hours: Yes  LOW acuity---Tx typically complete <24 hrs---ACUTE conditions typically can be evaluated <24 hours---LABS likely to return to acceptable levels <24 hours---IS near functional baseline---EXPECTED to return to current living arrangement---NOT newly hypoxic: Meets criteria for 5C-Observation unit  Covid Evaluation: N/A  Diagnosis: TIA (transient ischemic attack) [993716]  Admitting Physician: Elwyn Reach [[id number]]  Attending Physician: Elwyn Reach [[id number]]  PT Class (Do Not Modify): Observation [104]  PT Acc Code (Do Not Modify): Observation [10022]       B Medical/Surgery History Past Medical History:  Diagnosis Date  . Allergy    seasonal  fall  . Cancer ([place])    Basal cell carcinoma; multiple  . Eczema   . Hyperlipidemia   . Hypertension   .  Osteoporosis   . Stroke [facility])    Past Surgical History:  Procedure Laterality Date  . CATARACT EXTRACTION, BILATERAL  06/29/2015  . INTRAMEDULLARY (IM) NAIL INTERTROCHANTERIC Left 03/30/2017   Procedure: INTRAMEDULLARY (IM) NAIL INTERTROCHANTRIC;  Surgeon: [name], MD;  Location: [facility];  Service: Orthopedics;  Laterality: Left;  . INTRAMEDULLARY (IM) NAIL INTERTROCHANTERIC Right 11/12/2018   Procedure: INTRAMEDULLARY (IM) NAIL RIGHT INTERTROCHANTERIC HIP FRACTURE;  Surgeon: [name], MD;  Location: [facility];  Service: Orthopedics;  Laterality: Right;  . MOHS SURGERY    . TONSILLECTOMY  1940 maybe     A IV Location/Drains/Wounds Patient Lines/Drains/Airways Status   Active Line/Drains/Airways    Name:   Placement date:   Placement time:   Site:   Days:   Peripheral IV 03/08/19 Left Antecubital   03/08/19    0000    Antecubital   less than 1   Incision (Closed) 11/12/18 Hip Right   11/12/18    1517     116   Incision (Closed) 11/12/18 Thigh Right   11/12/18    1517     116   Incision (Closed) 11/12/18 Thigh Right   11/12/18    1517     116   Wound / Incision (Open or Dehisced) 01/17/18 Leg Left Left shin   01/17/18    0700    Leg   415          Intake/Output Last 24 hours No intake or output data in  the 24 hours ending 03/08/19 0139  Labs/Imaging Results for orders placed or performed during the hospital encounter of 03/07/19 (from the past 48 hour(s))  Protime-INR     Status: None   Collection Time: 03/07/19 11:14 PM  Result Value Ref Range   Prothrombin Time 13.6 11.4 - 15.2 seconds   INR 1.1 0.8 - 1.2    Comment: (NOTE) INR goal varies based on device and disease states. Performed at [facility], [place] [address] [place], [place] [address]   APTT     Status: None   Collection Time: 03/07/19 11:14 PM  Result Value Ref Range   aPTT 25 24 - 36 seconds    Comment: Performed at [facility] [address], [place], [place] [address]  CBC     Status:  None   Collection Time: 03/07/19 11:14 PM  Result Value Ref Range   WBC 10.0 4.0 - 10.5 K/uL   RBC 5.08 4.22 - 5.81 MIL/uL   Hemoglobin 15.5 13.0 - 17.0 g/dL   HCT 45.9 39.0 - 52.0 %   MCV 90.4 80.0 - 100.0 fL   MCH 30.5 26.0 - 34.0 pg   MCHC 33.8 30.0 - 36.0 g/dL   RDW 12.6 11.5 - 15.5 %   Platelets 209 150 - 400 K/uL   nRBC 0.0 0.0 - 0.2 %    Comment: Performed at [facility], [place] [address], [place], [place] [address]  Differential     Status: None   Collection Time: 03/07/19 11:14 PM  Result Value Ref Range   Neutrophils Relative % 65 %   Neutro Abs 6.6 1.7 - 7.7 K/uL   Lymphocytes Relative 19 %   Lymphs Abs 1.9 0.7 - 4.0 K/uL   Monocytes Relative 9 %   Monocytes Absolute 0.9 0.1 - 1.0 K/uL   Eosinophils Relative 5 %   Eosinophils Absolute 0.5 0.0 - 0.5 K/uL   Basophils Relative 1 %   Basophils Absolute 0.1 0.0 - 0.1 K/uL   Immature Granulocytes 1 %   Abs Immature Granulocytes 0.05 0.00 - 0.07 K/uL    Comment: Performed at [facility] [address], [place], [place] [address]  Comprehensive metabolic panel     Status: Abnormal   Collection Time: 03/07/19 11:14 PM  Result Value Ref Range   Sodium 129 (L) 135 - 145 mmol/L   Potassium 4.9 3.5 - 5.1 mmol/L   Chloride 96 (L) 98 - 111 mmol/L   CO2 23 22 - 32 mmol/L   Glucose, Bld 96 70 - 99 mg/dL   BUN 13 8 - 23 mg/dL   Creatinine, Ser 0.99 0.61 - 1.24 mg/dL   Calcium 9.0 8.9 - 10.3 mg/dL   Total Protein 6.8 6.5 - 8.1 g/dL   Albumin 3.6 3.5 - 5.0 g/dL   AST 22 15 - 41 U/L   ALT 15 0 - 44 U/L   Alkaline Phosphatase 86 38 - 126 U/L   Total Bilirubin 0.7 0.3 - 1.2 mg/dL   GFR calc non Af Amer >60 >60 mL/min   GFR calc Af Amer >60 >60 mL/min   Anion gap 10 5 - 15    Comment: Performed at [facility], [place] [address], [place],  [address]  I-stat Creatinine, ED     Status: None   Collection Time: 03/07/19 11:19 PM  Result Value Ref Range   Creatinine, Ser 1.00 0.61 - 1.24 mg/dL  CBG  monitoring, ED     Status: Abnormal   Collection Time: 03/07/19 11:45 PM  Result Value Ref Range   Glucose-Capillary 104 (H) 70 - 99 mg/dL  SARS Coronavirus 2 [facility] order, Performed in [place] hospital lab)     Status: None   Collection Time: 03/07/19 11:54 PM  Result Value Ref Range   SARS Coronavirus 2 NEGATIVE NEGATIVE    Comment: (NOTE) If result is NEGATIVE SARS-CoV-2 target nucleic acids are NOT DETECTED. The SARS-CoV-2 RNA is generally detectable in upper and lower  respiratory specimens during the acute phase of infection. The lowest  concentration of SARS-CoV-2 viral copies this assay can detect is 250  copies / mL. A negative result does not preclude SARS-CoV-2 infection  and should not be used as the sole basis for treatment or other  patient management decisions.  A negative result may occur with  improper specimen collection / handling, submission of specimen other  than nasopharyngeal swab, presence of viral mutation(s) within the  areas targeted by this assay, and inadequate number of viral copies  (<250 copies / mL). A negative result must be combined with clinical  observations, patient history, and epidemiological information. If result is POSITIVE SARS-CoV-2 target nucleic acids are DETECTED. The SARS-CoV-2 RNA is generally detectable in upper and lower  respiratory specimens dur ing the acute phase of infection.  Positive  results are indicative of active infection with SARS-CoV-2.  Clinical  correlation with patient history and other diagnostic information is  necessary to determine patient infection status.  Positive results do  not rule out bacterial infection or co-infection with other viruses. If result is PRESUMPTIVE POSTIVE SARS-CoV-2 nucleic acids MAY BE PRESENT.   A presumptive positive result was obtained on the submitted specimen  and confirmed on repeat testing.  While 2019 novel coronavirus  (SARS-CoV-2) nucleic acids may be present in the  submitted sample  additional confirmatory testing may be necessary for epidemiological  and / or clinical management purposes  to differentiate between  SARS-CoV-2 and other Sarbecovirus currently known to infect humans.  If clinically indicated additional testing with an alternate test  methodology [phone]) is advised. The SARS-CoV-2 RNA is generally  detectable in upper and lower respiratory sp ecimens during the acute  phase of infection. The expected result is Negative. Fact Sheet for Patients:  StrictlyIdeas.no Fact Sheet for Healthcare Providers: BankingDealers.co.za This test is not yet approved or cleared by the [place] FDA and has been authorized for detection and/or diagnosis of SARS-CoV-2 by FDA under an Emergency Use Authorization (EUA).  This EUA will remain in effect (meaning this test can be used) for the duration of the COVID-19 declaration under Section 564(b)(1) of the Act, 21 U.S.C. section 360bbb-3(b)(1), unless the authorization is terminated or revoked sooner. Performed at [facility], [place] [address], [place], [place] [address]    Ct Angio Head W Or Wo Contrast  Result Date: 03/07/2019 CLINICAL DATA:  Aphasia EXAM: CT ANGIOGRAPHY HEAD AND NECK TECHNIQUE: Multidetector CT imaging of the head and neck was performed using the standard protocol during bolus administration of intravenous contrast. Multiplanar CT image reconstructions and MIPs were obtained to evaluate the vascular anatomy. Carotid stenosis measurements (when applicable) are obtained utilizing NASCET criteria, using the distal internal carotid diameter as the denominator. CONTRAST:  29mL OMNIPAQUE IOHEXOL 350 MG/ML SOLN COMPARISON:  Head CT same day and 11/16/2018 FINDINGS: CTA NECK FINDINGS SKELETON: There is no bony spinal canal stenosis. No lytic or blastic lesion.  OTHER NECK: Large hypodense left thyroid nodule measures 4.0 x 4.9 cm. UPPER  CHEST: No pneumothorax or pleural effusion. No nodules or masses. AORTIC ARCH: There is mild calcific atherosclerosis of the aortic arch. There is no aneurysm, dissection or hemodynamically significant stenosis of the visualized ascending aorta and aortic arch. Conventional 3 vessel aortic branching pattern. The visualized proximal subclavian arteries are widely patent. RIGHT CAROTID SYSTEM: --Common carotid artery: Widely patent origin without common carotid artery dissection or aneurysm. --Internal carotid artery: No dissection, occlusion or aneurysm. There is mixed density atherosclerosis extending into the proximal ICA, resulting in 60% stenosis. --External carotid artery: No acute abnormality. LEFT CAROTID SYSTEM: --Common carotid artery: Widely patent origin without common carotid artery dissection or aneurysm. --Internal carotid artery: No dissection, occlusion or aneurysm. There is mixed density atherosclerosis extending into the proximal ICA, resulting in 50% stenosis. --External carotid artery: No acute abnormality. VERTEBRAL ARTERIES: Left dominant configuration. There is atherosclerotic calcification at the right vertebral artery origin causing at least moderate narrowing. The left vertebral artery origin is normal. No dissection, occlusion or flow-limiting stenosis to the vertebrobasilar confluence. CTA HEAD FINDINGS POSTERIOR CIRCULATION: --Vertebral arteries: Normal codominant configuration of V4 segments. --Posterior inferior cerebellar arteries (PICA): Common AICA PICA origins bilaterally. --Anterior inferior cerebellar arteries (AICA): Patent origins from the basilar artery. --Basilar artery: Normal. --Superior cerebellar arteries: Normal. --Posterior cerebral arteries (PCA): Normal. Both originate from the basilar artery. Posterior communicating arteries (p-comm) are diminutive or absent. ANTERIOR CIRCULATION: --Intracranial internal carotid arteries: Normal. --Anterior cerebral arteries (ACA):  Normal. Both A1 segments are present. Patent anterior communicating artery (a-comm). --Middle cerebral arteries (MCA): Normal. VENOUS SINUSES: As permitted by contrast timing, patent. ANATOMIC VARIANTS: None Review of the MIP images confirms the above findings. IMPRESSION: 1. No emergent large vessel occlusion. 2. Mixed density atherosclerotic disease at both carotid bifurcations causing stenosis of 60% on the right and 50% on the left. Electronically Signed   By: Ulyses Jarred M.D.   On: 03/07/2019 23:47   Ct Angio Neck W Or Wo Contrast  Result Date: 03/07/2019 CLINICAL DATA:  Aphasia EXAM: CT ANGIOGRAPHY HEAD AND NECK TECHNIQUE: Multidetector CT imaging of the head and neck was performed using the standard protocol during bolus administration of intravenous contrast. Multiplanar CT image reconstructions and MIPs were obtained to evaluate the vascular anatomy. Carotid stenosis measurements (when applicable) are obtained utilizing NASCET criteria, using the distal internal carotid diameter as the denominator. CONTRAST:  74mL OMNIPAQUE IOHEXOL 350 MG/ML SOLN COMPARISON:  Head CT same day and 11/16/2018 FINDINGS: CTA NECK FINDINGS SKELETON: There is no bony spinal canal stenosis. No lytic or blastic lesion. OTHER NECK: Large hypodense left thyroid nodule measures 4.0 x 4.9 cm. UPPER CHEST: No pneumothorax or pleural effusion. No nodules or masses. AORTIC ARCH: There is mild calcific atherosclerosis of the aortic arch. There is no aneurysm, dissection or hemodynamically significant stenosis of the visualized ascending aorta and aortic arch. Conventional 3 vessel aortic branching pattern. The visualized proximal subclavian arteries are widely patent. RIGHT CAROTID SYSTEM: --Common carotid artery: Widely patent origin without common carotid artery dissection or aneurysm. --Internal carotid artery: No dissection, occlusion or aneurysm. There is mixed density atherosclerosis extending into the proximal ICA, resulting  in 60% stenosis. --External carotid artery: No acute abnormality. LEFT CAROTID SYSTEM: --Common carotid artery: Widely patent origin without common carotid artery dissection or aneurysm. --Internal carotid artery: No dissection, occlusion or aneurysm. There is mixed density atherosclerosis extending into the proximal ICA, resulting in 50% stenosis. --External carotid  artery: No acute abnormality. VERTEBRAL ARTERIES: Left dominant configuration. There is atherosclerotic calcification at the right vertebral artery origin causing at least moderate narrowing. The left vertebral artery origin is normal. No dissection, occlusion or flow-limiting stenosis to the vertebrobasilar confluence. CTA HEAD FINDINGS POSTERIOR CIRCULATION: --Vertebral arteries: Normal codominant configuration of V4 segments. --Posterior inferior cerebellar arteries (PICA): Common AICA PICA origins bilaterally. --Anterior inferior cerebellar arteries (AICA): Patent origins from the basilar artery. --Basilar artery: Normal. --Superior cerebellar arteries: Normal. --Posterior cerebral arteries (PCA): Normal. Both originate from the basilar artery. Posterior communicating arteries (p-comm) are diminutive or absent. ANTERIOR CIRCULATION: --Intracranial internal carotid arteries: Normal. --Anterior cerebral arteries (ACA): Normal. Both A1 segments are present. Patent anterior communicating artery (a-comm). --Middle cerebral arteries (MCA): Normal. VENOUS SINUSES: As permitted by contrast timing, patent. ANATOMIC VARIANTS: None Review of the MIP images confirms the above findings. IMPRESSION: 1. No emergent large vessel occlusion. 2. Mixed density atherosclerotic disease at both carotid bifurcations causing stenosis of 60% on the right and 50% on the left. Electronically Signed   By: Ulyses Jarred M.D.   On: 03/07/2019 23:47   Ct Head Code Stroke Wo Contrast  Result Date: 03/07/2019 CLINICAL DATA:  Code stroke.  Aphasia EXAM: CT HEAD WITHOUT CONTRAST  TECHNIQUE: Contiguous axial images were obtained from the base of the skull through the vertex without intravenous contrast. COMPARISON:  11/16/2018 head CT FINDINGS: Brain: There is no mass, hemorrhage or extra-axial collection. There is generalized atrophy without lobar predilection. Areas of hypoattenuation of the deep gray nuclei and confluent periventricular white matter hypodensity, consistent with chronic small vessel disease. Old bilateral basal ganglia lacunar infarcts. There are old bilateral cerebellar small vessel infarcts. Vascular: No abnormal hyperdensity of the major intracranial arteries or dural venous sinuses. No intracranial atherosclerosis. Skull: The visualized skull base, calvarium and extracranial soft tissues are normal. Sinuses/Orbits: No fluid levels or advanced mucosal thickening of the visualized paranasal sinuses. No mastoid or middle ear effusion. The orbits are normal. ASPECTS [facility] Stroke Program Early CT Score) - Ganglionic level infarction (caudate, lentiform nuclei, internal capsule, insula, M1-M3 cortex): 7 - Supraganglionic infarction (M4-M6 cortex): 3 Total score (0-10 with 10 being normal): 10 IMPRESSION: 1. No acute intracranial hemorrhage. 2. Multiple old small vessel infarcts and findings of chronic ischemic microangiopathy. 3. ASPECTS is 10. These results were communicated to Dr. Amie [place] at 11:25 pm on 03/07/2019 by text page via the [facility] messaging system. Electronically Signed   By: Ulyses Jarred M.D.   On: 03/07/2019 23:26    Pending Labs Unresulted Labs (From admission, onward)    Start     Ordered   03/07/19 2340  Urinalysis, Routine w reflex microscopic  ONCE - STAT,   R     03/07/19 2339   Signed and Held  Hemoglobin A1c  Tomorrow morning,   R     Signed and Held   Signed and Held  Lipid panel  Tomorrow morning,   R    Comments:  Fasting    Signed and Held   Signed and Held  CBC  (enoxaparin (LOVENOX)    CrCl >/= 30 ml/min)  Once,   R     Comments:  Baseline for enoxaparin therapy IF NOT ALREADY DRAWN.  Notify MD if PLT < 100 K.    Signed and Held   Signed and Held  Creatinine, serum  (enoxaparin (LOVENOX)    CrCl >/= 30 ml/min)  Once,   R    Comments:  Baseline for  enoxaparin therapy IF NOT ALREADY DRAWN.    Signed and Held   Signed and Held  Creatinine, serum  (enoxaparin (LOVENOX)    CrCl >/= 30 ml/min)  Weekly,   R    Comments:  while on enoxaparin therapy    Signed and Held   Signed and Held  Comprehensive metabolic panel  Tomorrow morning,   R     Signed and Held   Signed and Held  CBC  Tomorrow morning,   R     Signed and Held   Signed and Held  Hemoglobin A1c  Once,   R     Signed and Held   Signed and Held  Lipid panel  Once,   R     Signed and Held          Vitals/Pain Today's Vitals   03/08/19 0045 03/08/19 0100 03/08/19 0102 03/08/19 0115  BP: (!) 160/90 (!) 159/129  (!) 145/82  Pulse: 79 77  77  Resp: 20 (!) 22  (!) 21  Temp:      TempSrc:      SpO2: 96% 96%  100%  Weight:      PainSc:   0-No pain     Isolation Precautions No active isolations  Medications Medications  sodium chloride flush (NS) 0.9 % injection 3 mL (has no administration in time range)  iohexol (OMNIPAQUE) 350 MG/ML injection 75 mL (75 mLs Intravenous Contrast Given 03/07/19 2331)    Mobility walks with person assist High fall risk   Focused Assessments Neuro Assessment Handoff:  Swallow screen pass? did not complete, wanted to wait until the morning   NIH Stroke Scale ( + Modified Stroke Scale Criteria)  Interval: Initial Level of Consciousness (1a.)   : Alert, keenly responsive LOC Questions (1b. )   +: Answers both questions correctly LOC Commands (1c. )   + : Performs both tasks correctly Best Gaze (2. )  +: Normal Visual (3. )  +: No visual loss Facial Palsy (4. )    : Normal symmetrical movements Motor Arm, Left (5a. )   +: No drift Motor Arm, Right (5b. )   +: No drift Motor Leg, Left (6a. )   +:  No drift Motor Leg, Right (6b. )   +: No drift Limb Ataxia (7. ): Absent Sensory (8. )   +: Normal, no sensory loss Best Language (9. )   +: No aphasia Dysarthria (10. ): Mild-to-moderate dysarthria, patient slurs at least some words and, at worst, can be understood with some difficulty Extinction/Inattention (11.)   +: No Abnormality Modified SS Total  +: 0 Complete NIHSS TOTAL: 1 Last date known well: 03/07/19 Last time known well: 2215 Neuro Assessment:   Neuro Checks:   Initial (03/07/19 2300)  Last Documented NIHSS Modified Score: 0 (03/08/19 0100) Has TPA been given? No If patient is a Neuro Trauma and patient is going to OR before floor call report to [place] nurse: [phone] or [phone]     R Recommendations: See Admitting Provider Note  Report given to:   Additional Notes: Pt at baseline, need to speak loudly and slowly but able to respond.

## 2019-03-08 NOTE — TOC Initial Note (Signed)
Transition of Care [facility]) - Initial/Assessment Note    Patient Details  Name: [name] MRN: [id number] Date of Birth: 01-28-1932  [facility] CM/SW Contact:    Pollie Friar, RN Phone Number: 03/08/2019, 5:33 PM  Clinical Narrative:                 CM spoke to daughter and they are already active with [facility] for [facility]. Cassie with [facility] made aware of admission and d/c in am.   Expected Discharge Plan: [place] Barriers to Discharge: Continued Medical Work up   Patient Goals and CMS Choice   CMS Medicare.gov Compare Post Acute Care list provided to:: Patient Represenative (must comment)(daughter) Choice offered to / list presented to : Adult Children  Expected Discharge Plan and Services Expected Discharge Plan: [place]   Discharge Planning Services: CM Consult Post Acute Care Choice: Home Health   Expected Discharge Date: 03/10/19                         HH Arranged: PT, RN, OT HH Agency: [facility] Date [facility] Agency Contacted: 03/08/19 Time HH Agency Contacted: [place] Representative spoke with at [place]: [place] Arrangements/Services   Lives with:: Adult Children   Do you feel safe going back to the place where you live?: Yes      Need for Family Participation in Patient Care: Yes (Comment) Care giver support system in place?: Yes (comment) Current home services: Home PT([facility])    Activities of Daily Living Home Assistive Devices/Equipment: Environmental consultant (specify type) ADL Screening (condition at time of admission) Patient's cognitive ability adequate to safely complete daily activities?: Yes Is the patient deaf or have difficulty hearing?: No Does the patient have difficulty seeing, even when wearing glasses/contacts?: No Does the patient have difficulty concentrating, remembering, or making decisions?: No Patient able to express need for assistance with ADLs?: Yes Does the  patient have difficulty dressing or bathing?: No Independently performs ADLs?: Yes (appropriate for developmental age) Does the patient have difficulty walking or climbing stairs?: Yes Weakness of Legs: Both Weakness of Arms/Hands: None  Permission Sought/Granted                  Emotional Assessment           Psych Involvement: No (comment)  Admission diagnosis:  Aphasia [R47.01] Patient Active Problem List   Diagnosis Date Noted  . TIA (transient ischemic attack) 03/08/2019  . Hyponatremia 03/08/2019  . Impaired mobility 01/13/2019  . Seasonal allergies 01/13/2019  . Hiatal hernia 01/13/2019  . Pain in right hip 12/24/2018  . S/P right hip fracture 11/11/2018  . Recurrent falls 05/12/2018  . History of CVA (cerebrovascular accident) 01/16/2018  . Nocturnal enuresis   . Fall   . Full incontinence of feces   . Essential hypertension   . Hypoalbuminemia due to protein-calorie malnutrition ([place])   . Abnormality of gait   . Leg edema   . Hip fracture ([place]) 03/30/2017  . Leukocytosis 03/29/2017  . Mild intermittent asthma with acute exacerbation 01/29/2017  . Chronic renal insufficiency 09/22/2015  . Tachycardia 09/22/2015  . PVC (premature ventricular contraction) 09/22/2015  . Osteoporosis 06/03/2014  . Basal cell carcinoma of face 06/03/2014  . Benign prostatic hyperplasia 05/27/2013  . Eczema 04/23/2012  . Dyslipidemia 04/23/2012   PCP:  Rutherford Guys, MD Pharmacy:   [facility],  [place] [address] Phone: [phone] Fax: [phone]     Social Determinants of Health (SDOH) Interventions    Readmission Risk Interventions No flowsheet data found.

## 2019-03-08 NOTE — ED Notes (Signed)
Attempted to complete swallow screen, stated "I'm just so tired, can we tomorrow?"

## 2019-03-08 NOTE — Care Management Obs Status (Signed)
[place] NOTIFICATION   Patient Details  Name: Kyden Potash MRN: [id number] Date of Birth: [birth year]   Medicare Observation Status Notification Given:  Yes    Pollie Friar, RN 03/08/2019, 5:29 PM

## 2019-03-08 NOTE — Evaluation (Signed)
Physical Therapy Evaluation Patient Details Name: [name] MRN: [id number] DOB: 22-Jun-1932 Today's Date: 03/08/2019   History of Present Illness  Pt is a 85 y/o male presenting with Rsided weakness and dysarthria. CT negative. PMH: HTN, CVA, osteoprorosis, IM nail R hip.   Clinical Impression  Pt admitted with above. Spoke with daughter who provided PLOF. Pt has 24/7 assist and is functioning near baseline. Pt was having HHPT PTA and recommend pt to con't with that. Pt minA for transfers and modA for ambulation due to weakness and poor walker management.Acute PT to cont to follow.     Follow Up Recommendations Home health PT;Supervision/Assistance - 24 hour(continue with PT that was working with him PTA)    Equipment Recommendations  None recommended by PT    Recommendations for Other Services       Precautions / Restrictions Precautions Precautions: Fall Restrictions Weight Bearing Restrictions: No      Mobility  Bed Mobility Overal bed mobility: Needs Assistance Bed Mobility: Sit to Supine     Supine to sit: Min assist Sit to supine: Min assist   General bed mobility comments: max directional verbal cues to scoot to bed rail to get high up in bed, minA for LE management back into the bed  Transfers Overall transfer level: Needs assistance Equipment used: Rolling walker (2 wheeled) Transfers: Sit to/from Stand Sit to Stand: Min assist         General transfer comment: minA to power up, verbal cues for hand placement, minA to steady during transition of hands, pt with significant trunk flexion  Ambulation/Gait Ambulation/Gait assistance: Mod assist Gait Distance (Feet): 100 Feet Assistive device: Rolling walker (2 wheeled) Gait Pattern/deviations: Step-through pattern;Decreased stride length;Shuffle;Decreased dorsiflexion - left Gait velocity: dec Gait velocity interpretation: <1.31 ft/sec, indicative of household ambulator General Gait Details: pt with  decreased step height bilaterally, pt dragging more of L LE than R despite the R hip being fracture recently, max verbal and tactile cues for walker management  Stairs            Wheelchair Mobility    Modified Rankin (Stroke Patients Only)       Balance Overall balance assessment: Needs assistance Sitting-balance support: No upper extremity supported;Feet supported Sitting balance-Leahy Scale: Fair     Standing balance support: During functional activity;Bilateral upper extremity supported Standing balance-Leahy Scale: Poor Standing balance comment: reliant on UB support during mobility, leaning onto sink for support during grooming tasks                              Pertinent Vitals/Pain Pain Assessment: No/denies pain    Home Living Family/patient expects to be discharged to:: Private residence Living Arrangements: Alone Available Help at Discharge: Available 24 hours/day;Personal care attendant Type of Home: House Home Access: Stairs to enter Entrance Stairs-Rails: Right;Left;Can reach both Entrance Stairs-Number of Steps: 6-7 Home Layout: One level Home Equipment: Tub bench;Cane - single point;Walker - 2 wheels;Bedside commode      Prior Function Level of Independence: Needs assistance   Gait / Transfers Assistance Needed: uses RW and sometimes w/c  ADL's / Homemaking Assistance Needed: caregiver assist with dressing/bathing as needed, cooking,  dtr does grocery shopping  Comments: reports caregiver is present 24/7, verified by daughter     Hand Dominance   Dominant Hand: Right    Extremity/Trunk Assessment   Upper Extremity Assessment Upper Extremity Assessment: Generalized weakness    Lower Extremity Assessment  Lower Extremity Assessment: Generalized weakness(L worse than R)       Communication   Communication: HOH  Cognition Arousal/Alertness: Awake/alert Behavior During Therapy: WFL for tasks assessed/performed Overall  Cognitive Status: Impaired/Different from baseline Area of Impairment: Memory;Orientation                 Orientation Level: Disoriented to;Situation   Memory: Decreased short-term memory         General Comments: pt pleasant, disoriented to situation but able to follow commands throughout session; decreased recall of working with PT earlier today       General Comments General comments (skin integrity, edema, etc.): VSS    Exercises     Assessment/Plan    PT Assessment Patient needs continued PT services  PT Problem List Decreased strength;Decreased activity tolerance;Decreased balance;Decreased mobility;Decreased coordination;Decreased cognition;Decreased knowledge of use of DME;Decreased safety awareness       PT Treatment Interventions DME instruction;Gait training;Stair training;Functional mobility training;Therapeutic activities;Therapeutic exercise;Balance training;Neuromuscular re-education    PT Goals (Current goals can be found in the Care Plan section)  Acute Rehab PT Goals Patient Stated Goal: to get back home  PT Goal Formulation: With patient Time For Goal Achievement: 03/22/19 Potential to Achieve Goals: Good    Frequency Min 4X/week   Barriers to discharge        Co-evaluation               AM-PAC PT "6 Clicks" Mobility  Outcome Measure Help needed turning from your back to your side while in a flat bed without using bedrails?: A Little Help needed moving from lying on your back to sitting on the side of a flat bed without using bedrails?: A Little Help needed moving to and from a bed to a chair (including a wheelchair)?: A Little Help needed standing up from a chair using your arms (e.g., wheelchair or bedside chair)?: A Little Help needed to walk in hospital room?: A Lot Help needed climbing 3-5 steps with a railing? : A Lot 6 Click Score: 16    End of Session Equipment Utilized During Treatment: Gait belt Activity Tolerance:  Patient tolerated treatment well Patient left: in bed;with call bell/phone within reach(EEG tech present to set him for test in bed) Nurse Communication: Mobility status PT Visit Diagnosis: Unsteadiness on feet (R26.81);Muscle weakness (generalized) (M62.81);Difficulty in walking, not elsewhere classified (R26.2)    Time: [id number] PT Time Calculation (min) (ACUTE ONLY): 23 min   Charges:   PT Evaluation $PT Eval Moderate Complexity: 1 Mod PT Treatments $Gait Training: 8-22 mins        Kittie Plater, PT, DPT Acute Rehabilitation Services Pager #: [phone] Office #: [phone]   Berline Lopes 03/08/2019, 2:12 PM

## 2019-03-08 NOTE — H&P (Signed)
History and Physical   [name] TOI:[id number] DOB: 01-Dec-1931 DOA: 03/07/2019  Referring MD/NP/PA: Dr. Leonides Schanz  PCP: Rutherford Guys, MD   Patient coming from: Home  Chief Complaint: Aphasia, confusion  HPI: [name] is a 85 y.o. male with medical history significant of previous caudate head CVA, hypertension, hyperlipidemia, recent right hip fracture status post repair, osteoporosis and basal cell carcinoma who was brought to the ER with a code stroke that started around 10 PM tonight.  Patient came within 2 hours of the onset of the symptoms but on arrival was evaluated by neurology and EDP.  Stroke scale was only 1 with mild dysarthria so patient did not qualify for TPA.  He has had right-sided weakness including facial weakness in route according to EMS.  He appears to be weak generally.  Initial evaluation including head CT without contrast was negative.  CTA of the head and neck is also negative for large vessel occlusion.  He appears to have some carotid artery stenosis but notes critical.  Patient is suspected to have either have a TIA or seizure disorder or worsening of his previous CVA.  Neurology recommends admission for TIA work-up and is being admitted to the medical service..  ED Course: Temperature is 97.3 blood pressure 170/101 pulse 85 respiratory of 20 oxygen sat 98% room air.  CBC entirely within normal.  Sodium 129 potassium 4.9 chloride 96 CO2 23 BUN 13 creatinine 1.0.  COVID-19 testing is negative.  CT head and angiogram gram of the head and neck as indicated above.  Patient is being admitted for TIA work-up.  Review of Systems: As per HPI otherwise 10 point review of systems negative.    Past Medical History:  Diagnosis Date   Allergy    seasonal  fall   Cancer ([place])    Basal cell carcinoma; multiple   Eczema    Hyperlipidemia    Hypertension    Osteoporosis    Stroke [facility])     Past Surgical History:  Procedure Laterality Date   CATARACT EXTRACTION,  BILATERAL  06/29/2015   INTRAMEDULLARY (IM) NAIL INTERTROCHANTERIC Left 03/30/2017   Procedure: INTRAMEDULLARY (IM) NAIL INTERTROCHANTRIC;  Surgeon: [name], MD;  Location: [place];  Service: Orthopedics;  Laterality: Left;   INTRAMEDULLARY (IM) NAIL INTERTROCHANTERIC Right 11/12/2018   Procedure: INTRAMEDULLARY (IM) NAIL RIGHT INTERTROCHANTERIC HIP FRACTURE;  Surgeon: [name], MD;  Location: [place];  Service: Orthopedics;  Laterality: Right;   Carl maybe     reports that he has never smoked. He has never used smokeless tobacco. He reports that he does not drink alcohol or use drugs.  Allergies  Allergen Reactions   Seasonal Ic [Cholestatin]     Family History  Problem Relation Age of Onset   Arthritis Mother    Heart disease Mother 2   Hypertension Father      Prior to Admission medications   Medication Sig Start Date End Date Taking? Authorizing Provider  albuterol (PROVENTIL HFA;VENTOLIN HFA) 108 (90 Base) MCG/ACT inhaler Inhale 2 puffs into the lungs every 6 (six) hours as needed for wheezing or shortness of breath. 09/29/18   Forrest Moron, MD  alendronate (FOSAMAX) 70 MG tablet Take 1 tablet (70 mg total) by mouth every 7 (seven) days. Take with a full glass of water on an empty stomach. 07/27/18   Rutherford Guys, MD  aspirin EC 81 MG EC tablet Take 1 tablet (81 mg  total) by mouth daily. 01/21/18   Velvet Bathe, MD  beclomethasone (QVAR REDIHALER) 40 MCG/ACT inhaler Inhale 1 puff into the lungs 2 (two) times daily. 02/05/19   Rutherford Guys, MD  clobetasol ointment (TEMOVATE) 7.02 % Apply 1 application topically 2 (two) times daily. 07/27/18   Rutherford Guys, MD  enoxaparin (LOVENOX) 40 MG/0.4ML injection Inject 0.4 mLs (40 mg total) into the skin daily. 11/12/18   [name], MD  feeding supplement, ENSURE ENLIVE, (ENSURE ENLIVE) LIQD Take 237 mLs by mouth 2 (two) times daily between meals. 11/18/18   Dana Allan I,  MD  metoprolol succinate (TOPROL-XL) 50 MG 24 hr tablet Take 1 tablet (50 mg total) by mouth daily. Take with or immediately following a meal. 07/27/18   Rutherford Guys, MD  Misc. Devices [facility]) MISC Use walker whenever walking, standard walker 07/27/18   Rutherford Guys, MD  montelukast (SINGULAIR) 10 MG tablet Take 1 tablet (10 mg total) by mouth at bedtime. 01/13/19   Rutherford Guys, MD  polyethylene glycol [facility] Floria Raveling) packet Take 17 g by mouth daily as needed for mild constipation. 11/17/18   Bonnell Public, MD  pravastatin (PRAVACHOL) 40 MG tablet Take 1 tablet (40 mg total) by mouth daily. 07/27/18   Rutherford Guys, MD  senna-docusate (SENOKOT-S) 8.6-50 MG tablet Take 2 tablets by mouth 2 (two) times daily. 11/17/18   Bonnell Public, MD  Skin Protectants, Misc. (EUCERIN) cream Apply 1 application topically as needed for dry skin. 02/18/18   Wardell Honour, MD    Physical Exam: Vitals:   03/07/19 2340 03/07/19 2342  BP: (!) 139/112 (!) 170/101  Pulse: 85 77  Resp: 19 20  Temp: (!) 96.8 F (36 C) (!) 97.3 F (36.3 C)  TempSrc: Oral Oral  SpO2: 98% 99%  Weight:  98.8 kg      Constitutional: NAD, chronically ill looking, slightly confused, dysarthric Vitals:   03/07/19 2340 03/07/19 2342  BP: (!) 139/112 (!) 170/101  Pulse: 85 77  Resp: 19 20  Temp: (!) 96.8 F (36 C) (!) 97.3 F (36.3 C)  TempSrc: Oral Oral  SpO2: 98% 99%  Weight:  98.8 kg   Eyes: PERRL, lids and conjunctivae normal ENMT: Mucous membranes are moist. Posterior pharynx clear of any exudate or lesions.Normal dentition.  Neck: normal, supple, no masses, no thyromegaly Respiratory: clear to auscultation bilaterally, no wheezing, no crackles. Normal respiratory effort. No accessory muscle use.  Cardiovascular: Irregularly irregular rate and rhythm, no murmurs / rubs / gallops. No extremity edema. 2+ pedal pulses. No carotid bruits.  Abdomen: no tenderness, no masses palpated. No  hepatosplenomegaly. Bowel sounds positive.  Musculoskeletal: no clubbing / cyanosis. No joint deformity upper and lower extremities. Good ROM, no contractures. Normal muscle tone.  Skin: no rashes, lesions, ulcers. No induration Neurologic: Dysarthric, slow to respond. Sensation intact, DTR normal. Strength 5/5 in all 4.  Psychiatric: Slightly confused.  No agitation   Labs on Admission: I have personally reviewed following labs and imaging studies  CBC: Recent Labs  Lab 03/07/19 2314  WBC 10.0  NEUTROABS 6.6  HGB 15.5  HCT 45.9  MCV 90.4  PLT 637   Basic Metabolic Panel: Recent Labs  Lab 03/07/19 2314 03/07/19 2319  NA 129*  --   K 4.9  --   CL 96*  --   CO2 23  --   GLUCOSE 96  --   BUN 13  --   CREATININE  0.99 1.00  CALCIUM 9.0  --    GFR: Estimated Creatinine Clearance: 63.6 mL/min (by C-G formula based on SCr of 1 mg/dL). Liver Function Tests: Recent Labs  Lab 03/07/19 2314  AST 22  ALT 15  ALKPHOS 86  BILITOT 0.7  PROT 6.8  ALBUMIN 3.6   No results for input(s): LIPASE, AMYLASE in the last 168 hours. No results for input(s): AMMONIA in the last 168 hours. Coagulation Profile: Recent Labs  Lab 03/07/19 2314  INR 1.1   Cardiac Enzymes: No results for input(s): CKTOTAL, CKMB, CKMBINDEX, TROPONINI in the last 168 hours. BNP (last 3 results) No results for input(s): PROBNP in the last 8760 hours. HbA1C: No results for input(s): HGBA1C in the last 72 hours. CBG: Recent Labs  Lab 03/07/19 2345  GLUCAP 104*   Lipid Profile: No results for input(s): CHOL, HDL, LDLCALC, TRIG, CHOLHDL, LDLDIRECT in the last 72 hours. Thyroid Function Tests: No results for input(s): TSH, T4TOTAL, FREET4, T3FREE, THYROIDAB in the last 72 hours. Anemia Panel: No results for input(s): VITAMINB12, FOLATE, FERRITIN, TIBC, IRON, RETICCTPCT in the last 72 hours. Urine analysis:    Component Value Date/Time   COLORURINE YELLOW 01/17/2018 0327   APPEARANCEUR CLEAR  01/17/2018 0327   LABSPEC 1.021 01/17/2018 0327   PHURINE 5.0 01/17/2018 0327   GLUCOSEU NEGATIVE 01/17/2018 0327   HGBUR NEGATIVE 01/17/2018 0327   BILIRUBINUR NEGATIVE 01/17/2018 0327   BILIRUBINUR small (A) 01/16/2018 1551   BILIRUBINUR neg 06/03/2014 1014   KETONESUR 20 (A) 01/17/2018 0327   PROTEINUR NEGATIVE 01/17/2018 0327   UROBILINOGEN 4.0 (A) 01/16/2018 1551   NITRITE NEGATIVE 01/17/2018 0327   LEUKOCYTESUR NEGATIVE 01/17/2018 0327   Sepsis Labs: @LABRCNTIP (procalcitonin:4,lacticidven:4) )No results found for this or any previous visit (from the past 240 hour(s)).   Radiological Exams on Admission: Ct Angio Head W Or Wo Contrast  Result Date: 03/07/2019 CLINICAL DATA:  Aphasia EXAM: CT ANGIOGRAPHY HEAD AND NECK TECHNIQUE: Multidetector CT imaging of the head and neck was performed using the standard protocol during bolus administration of intravenous contrast. Multiplanar CT image reconstructions and MIPs were obtained to evaluate the vascular anatomy. Carotid stenosis measurements (when applicable) are obtained utilizing NASCET criteria, using the distal internal carotid diameter as the denominator. CONTRAST:  78mL OMNIPAQUE IOHEXOL 350 MG/ML SOLN COMPARISON:  Head CT same day and 11/16/2018 FINDINGS: CTA NECK FINDINGS SKELETON: There is no bony spinal canal stenosis. No lytic or blastic lesion. OTHER NECK: Large hypodense left thyroid nodule measures 4.0 x 4.9 cm. UPPER CHEST: No pneumothorax or pleural effusion. No nodules or masses. AORTIC ARCH: There is mild calcific atherosclerosis of the aortic arch. There is no aneurysm, dissection or hemodynamically significant stenosis of the visualized ascending aorta and aortic arch. Conventional 3 vessel aortic branching pattern. The visualized proximal subclavian arteries are widely patent. RIGHT CAROTID SYSTEM: --Common carotid artery: Widely patent origin without common carotid artery dissection or aneurysm. --Internal carotid artery:  No dissection, occlusion or aneurysm. There is mixed density atherosclerosis extending into the proximal ICA, resulting in 60% stenosis. --External carotid artery: No acute abnormality. LEFT CAROTID SYSTEM: --Common carotid artery: Widely patent origin without common carotid artery dissection or aneurysm. --Internal carotid artery: No dissection, occlusion or aneurysm. There is mixed density atherosclerosis extending into the proximal ICA, resulting in 50% stenosis. --External carotid artery: No acute abnormality. VERTEBRAL ARTERIES: Left dominant configuration. There is atherosclerotic calcification at the right vertebral artery origin causing at least moderate narrowing. The left vertebral artery origin is normal.  No dissection, occlusion or flow-limiting stenosis to the vertebrobasilar confluence. CTA HEAD FINDINGS POSTERIOR CIRCULATION: --Vertebral arteries: Normal codominant configuration of V4 segments. --Posterior inferior cerebellar arteries (PICA): Common AICA PICA origins bilaterally. --Anterior inferior cerebellar arteries (AICA): Patent origins from the basilar artery. --Basilar artery: Normal. --Superior cerebellar arteries: Normal. --Posterior cerebral arteries (PCA): Normal. Both originate from the basilar artery. Posterior communicating arteries (p-comm) are diminutive or absent. ANTERIOR CIRCULATION: --Intracranial internal carotid arteries: Normal. --Anterior cerebral arteries (ACA): Normal. Both A1 segments are present. Patent anterior communicating artery (a-comm). --Middle cerebral arteries (MCA): Normal. VENOUS SINUSES: As permitted by contrast timing, patent. ANATOMIC VARIANTS: None Review of the MIP images confirms the above findings. IMPRESSION: 1. No emergent large vessel occlusion. 2. Mixed density atherosclerotic disease at both carotid bifurcations causing stenosis of 60% on the right and 50% on the left. Electronically Signed   By: Ulyses Jarred M.D.   On: 03/07/2019 23:47   Ct Angio  Neck W Or Wo Contrast  Result Date: 03/07/2019 CLINICAL DATA:  Aphasia EXAM: CT ANGIOGRAPHY HEAD AND NECK TECHNIQUE: Multidetector CT imaging of the head and neck was performed using the standard protocol during bolus administration of intravenous contrast. Multiplanar CT image reconstructions and MIPs were obtained to evaluate the vascular anatomy. Carotid stenosis measurements (when applicable) are obtained utilizing NASCET criteria, using the distal internal carotid diameter as the denominator. CONTRAST:  42mL OMNIPAQUE IOHEXOL 350 MG/ML SOLN COMPARISON:  Head CT same day and 11/16/2018 FINDINGS: CTA NECK FINDINGS SKELETON: There is no bony spinal canal stenosis. No lytic or blastic lesion. OTHER NECK: Large hypodense left thyroid nodule measures 4.0 x 4.9 cm. UPPER CHEST: No pneumothorax or pleural effusion. No nodules or masses. AORTIC ARCH: There is mild calcific atherosclerosis of the aortic arch. There is no aneurysm, dissection or hemodynamically significant stenosis of the visualized ascending aorta and aortic arch. Conventional 3 vessel aortic branching pattern. The visualized proximal subclavian arteries are widely patent. RIGHT CAROTID SYSTEM: --Common carotid artery: Widely patent origin without common carotid artery dissection or aneurysm. --Internal carotid artery: No dissection, occlusion or aneurysm. There is mixed density atherosclerosis extending into the proximal ICA, resulting in 60% stenosis. --External carotid artery: No acute abnormality. LEFT CAROTID SYSTEM: --Common carotid artery: Widely patent origin without common carotid artery dissection or aneurysm. --Internal carotid artery: No dissection, occlusion or aneurysm. There is mixed density atherosclerosis extending into the proximal ICA, resulting in 50% stenosis. --External carotid artery: No acute abnormality. VERTEBRAL ARTERIES: Left dominant configuration. There is atherosclerotic calcification at the right vertebral artery origin  causing at least moderate narrowing. The left vertebral artery origin is normal. No dissection, occlusion or flow-limiting stenosis to the vertebrobasilar confluence. CTA HEAD FINDINGS POSTERIOR CIRCULATION: --Vertebral arteries: Normal codominant configuration of V4 segments. --Posterior inferior cerebellar arteries (PICA): Common AICA PICA origins bilaterally. --Anterior inferior cerebellar arteries (AICA): Patent origins from the basilar artery. --Basilar artery: Normal. --Superior cerebellar arteries: Normal. --Posterior cerebral arteries (PCA): Normal. Both originate from the basilar artery. Posterior communicating arteries (p-comm) are diminutive or absent. ANTERIOR CIRCULATION: --Intracranial internal carotid arteries: Normal. --Anterior cerebral arteries (ACA): Normal. Both A1 segments are present. Patent anterior communicating artery (a-comm). --Middle cerebral arteries (MCA): Normal. VENOUS SINUSES: As permitted by contrast timing, patent. ANATOMIC VARIANTS: None Review of the MIP images confirms the above findings. IMPRESSION: 1. No emergent large vessel occlusion. 2. Mixed density atherosclerotic disease at both carotid bifurcations causing stenosis of 60% on the right and 50% on the left. Electronically Signed   By:  Ulyses Jarred M.D.   On: 03/07/2019 23:47   Ct Head Code Stroke Wo Contrast  Result Date: 03/07/2019 CLINICAL DATA:  Code stroke.  Aphasia EXAM: CT HEAD WITHOUT CONTRAST TECHNIQUE: Contiguous axial images were obtained from the base of the skull through the vertex without intravenous contrast. COMPARISON:  11/16/2018 head CT FINDINGS: Brain: There is no mass, hemorrhage or extra-axial collection. There is generalized atrophy without lobar predilection. Areas of hypoattenuation of the deep gray nuclei and confluent periventricular white matter hypodensity, consistent with chronic small vessel disease. Old bilateral basal ganglia lacunar infarcts. There are old bilateral cerebellar small  vessel infarcts. Vascular: No abnormal hyperdensity of the major intracranial arteries or dural venous sinuses. No intracranial atherosclerosis. Skull: The visualized skull base, calvarium and extracranial soft tissues are normal. Sinuses/Orbits: No fluid levels or advanced mucosal thickening of the visualized paranasal sinuses. No mastoid or middle ear effusion. The orbits are normal. ASPECTS [facility] Stroke Program Early CT Score) - Ganglionic level infarction (caudate, lentiform nuclei, internal capsule, insula, M1-M3 cortex): 7 - Supraganglionic infarction (M4-M6 cortex): 3 Total score (0-10 with 10 being normal): 10 IMPRESSION: 1. No acute intracranial hemorrhage. 2. Multiple old small vessel infarcts and findings of chronic ischemic microangiopathy. 3. ASPECTS is 10. These results were communicated to Dr. Amie Portland at 11:25 pm on 03/07/2019 by text page via the [facility] messaging system. Electronically Signed   By: Ulyses Jarred M.D.   On: 03/07/2019 23:26    EKG: Independently reviewed.  It shows sinus rhythm with no specific ST changes.  Borderline QT prolongation.  Assessment/Plan Principal Problem:   TIA (transient ischemic attack) Active Problems:   Dyslipidemia   Osteoporosis   Chronic renal insufficiency   Hip fracture (HCC)   Essential hypertension   History of CVA (cerebrovascular accident)   Impaired mobility   Hyponatremia     #1 TIA: Patient symptoms seem to be settling to the dysarthria at this point.  No other significant focal weakness.  Patient will be admitted.  MRI of the brain will be done.  Echocardiogram and carotid Dopplers.  Hemoglobin A1c and fasting lipid panel.  Change patient from baby to full dose aspirin and continue with statin.  Currently on pravastatin.  May change to high intensity statin like Lipitor while in the hospital.  Defer to neurology for possible Plavix treatment per current recommendations.  #2 hyperlipidemia: Continue with statin as above.  #3  hypertension: Again continue with home regimen.  #4 chronic kidney disease stage III: Appears to be at baseline.  Continue monitoring.  #5 recurrent falls: PT and OT consultation.  Also speech therapy consultation.  #6 hyponatremia: Probably secondary to dehydration.  Chloride is also low.  Gentle saline infusion.   DVT prophylaxis: Lovenox Code Status: Full code Family Communication: Daughter with the patient Disposition Plan: To be determined Consults called: Dr. Rory Percy neurology Admission status: Observation  Severity of Illness: The appropriate patient status for this patient is OBSERVATION. Observation status is judged to be reasonable and necessary in order to provide the required intensity of service to ensure the patient's safety. The patient's presenting symptoms, physical exam findings, and initial radiographic and laboratory data in the context of their medical condition is felt to place them at decreased risk for further clinical deterioration. Furthermore, it is anticipated that the patient will be medically stable for discharge from the hospital within 2 midnights of admission. The following factors support the patient status of observation.   " The patient's presenting symptoms  include dysarthria. " The physical exam findings include dysarthria. " The initial radiographic and laboratory data are negative for acute on CT.     Barbette Merino MD [facility] Pager [phone]  If 7PM-7AM, please contact night-coverage [email or web address] Password [facility]  03/08/2019, 12:46 AM

## 2019-03-08 NOTE — Procedures (Signed)
History: 85 yo M being evaluated for transient symptoms  Sedation: None  Technique: This is a 21 channel routine scalp EEG performed at the bedside with bipolar and monopolar montages arranged in accordance to the international 10/20 system of electrode placement. One channel was dedicated to EKG recording.    Background: The background consists of intermixed alpha and beta activities. There is a well defined posterior dominant rhythm of 8-9 Hz that attenuates with eye opening. Sleep is recorded with normal appearing structures.   Photic stimulation: Physiologic driving is not performed   EEG Abnormalities: None  Clinical Interpretation: This normal EEG is recorded in the waking and sleep state. There was no seizure or seizure predisposition recorded on this study. Please note that lack of epileptiform activity on EEG does not preclude the possibility of epilepsy.   Roland Rack, MD [facility] [phone]  If 7pm- 7am, please page neurology on call as listed in [place].

## 2019-03-08 NOTE — Progress Notes (Signed)
Pt unavailable for EEG at this time. Pt went to another procedure. Will attempt later when schedule permits

## 2019-03-08 NOTE — Progress Notes (Addendum)
Patient admitted after midnight, please see H&P. Here with aphasia and confusion.  Patient denies symptoms.  MRI/EEG/echo/carotid pending.   Neurology consult. [place] with daughter- plan for home in AM.  Need U/A as family endorsing urinary issues.  Home health added PT/OT/RN-- needs 24 hour assistance-- daughter will have in place in AM.

## 2019-03-08 NOTE — Progress Notes (Addendum)
STROKE TEAM PROGRESS NOTE   INTERVAL HISTORY Patient is sitting up in bed.  He looks comfortable.  He does not remember why he is in the hospital.  His speech appears to have improved.  MRI scan of the brain is negative for acute stroke.  EEG shows no seizure activity.  Vitals:   03/08/19 0330 03/08/19 0515 03/08/19 0715 03/08/19 0755  BP: 124/79 137/67 (!) 150/84   Pulse: 85 71 75   Resp: 20 17    Temp: 98.5 F (36.9 C) 97.7 F (36.5 C) 97.9 F (36.6 C)   TempSrc: Oral Oral Oral   SpO2: 97% 96% 97% 96%  Weight:        CBC:  Recent Labs  Lab 03/07/19 2314 03/08/19 0353  WBC 10.0 11.3*  NEUTROABS 6.6  --   HGB 15.5 15.3  HCT 45.9 44.4  MCV 90.4 88.1  PLT 209 756    Basic Metabolic Panel:  Recent Labs  Lab 03/07/19 2314 03/07/19 2319 03/08/19 0353  NA 129*  --  131*  K 4.9  --  4.6  CL 96*  --  94*  CO2 23  --  28  GLUCOSE 96  --  100*  BUN 13  --  11  CREATININE 0.99 1.00 1.04  CALCIUM 9.0  --  9.1   Lipid Panel:     Component Value Date/Time   CHOL 153 03/08/2019 0353   CHOL 161 03/11/2018 1545   TRIG 36 03/08/2019 0353   HDL 66 03/08/2019 0353   HDL 64 03/11/2018 1545   CHOLHDL 2.3 03/08/2019 0353   VLDL 7 03/08/2019 0353   LDLCALC 80 03/08/2019 0353   LDLCALC 79 03/11/2018 1545   HgbA1c:  Lab Results  Component Value Date   HGBA1C 5.6 03/08/2019    PHYSICAL EXAM Pleasant elderly Caucasian male currently not in distress. . Afebrile. Head is nontraumatic. Neck is supple without bruit.    Cardiac exam no murmur or gallop. Lungs are clear to auscultation. Distal pulses are well felt. Neurological Exam ;  Awake  Alert oriented x 3. Normal speech and language.diminished attention, registration and recall.  Eye movements full without nystagmus.fundi were not visualized. Vision acuity and fields appear normal. Hearing is normal. Palatal movements are normal. Face symmetric. Tongue midline. Normal strength, tone, reflexes and coordination. Normal  sensation. Gait deferred. ASSESSMENT/PLAN [name] is a 85 y.o. male with history of hypertension, hyperlipidemia, right hip fracture with right-sided weakness over the last 6 months, prior stroke and right caudate head with no residual deficits presenting with confusion and altered mental status, possible aphasia.   Left hemispheric TIA    Code Stroke CT head No acute stroke. Small vessel disease. ASPECTS 10.     CTA head & neck no ELVO. R ICA 60%, L ICA 50%  MRI  Small vessel disease. Atrophy. No acute stroke  2D Echo normal ejection fraction.  No cardiac source of embolism.  EEG normal.  No seizure activity.  LDL 90  HgbA1c 5.6  UDS / ETOH level not performed   Lovenox 40 mg sq daily for VTE prophylaxis  On diet  aspirin 81 mg daily prior to admission, now on aspirin 325 mg daily. Given TIA,recommend aspirin 81 mg and plavix 75 mg daily x 3 weeks, then PLAVIX alone. Orders adjusted.   Therapy recommendations: Pending  Disposition: Pending  Hypertension  Stable . BP goal normotensive given no stroke  Hyperlipidemia  Home meds:  pravachol 40, resumed in  hospital  LDL 80  Continue statin at discharge  Other Stroke Risk Factors  Advanced age  Obesity, Body mass index is 31.25 kg/m., recommend weight loss, diet and exercise as appropriate   Hx stroke/TIA  12/2017 -right caudate head infarct secondary to small vessel disease.  Placed on dual antiplatelets for 3 weeks then aspirin alone  Other Active Problems  CKD stage III  Recurrent falls  Hyponatremia  Hospital day # 0  I have personally obtained history,examined this patient, reviewed notes, independently viewed imaging studies, participated in medical decision making and plan of care.ROS completed by me personally and pertinent positives fully documented  I have made any additions or clarifications directly to the above note.    He presented with transient right right facial weakness and speech  difficulties possibly from left hemispheric TIA from small vessel disease. Aspirin and Plavix for 3 weeks followed by Plavix alone.  Aggressive risk factor modification.  Long hospital questions.  With patient as well as with Dr. Lucianne Lei and answered questions.  Greater than 50% time 25-minute visit was spent on counseling and coordination of care for TIA and stroke risk discussion and answering,Follow up as outpatient in stroke clinic. Stroke team will sign off. Call for questions. questions. Antony Contras, MD Medical Director [place] Pager: [phone] 03/08/2019 4:26 PM   To contact Stroke Continuity provider, please refer to [email or web address] After hours, contact General Neurology

## 2019-03-08 NOTE — Evaluation (Signed)
Speech Language Pathology Evaluation Patient Details Name: [name] MRN: [id number] DOB: August 26, 1932 Today's Date: 03/08/2019 Time: 1530-1600 SLP Time Calculation (min) (ACUTE ONLY): 30 min  Problem List:  Patient Active Problem List   Diagnosis Date Noted  . TIA (transient ischemic attack) 03/08/2019  . Hyponatremia 03/08/2019  . Impaired mobility 01/13/2019  . Seasonal allergies 01/13/2019  . Hiatal hernia 01/13/2019  . Pain in right hip 12/24/2018  . S/P right hip fracture 11/11/2018  . Recurrent falls 05/12/2018  . History of CVA (cerebrovascular accident) 01/16/2018  . Nocturnal enuresis   . Fall   . Full incontinence of feces   . Essential hypertension   . Hypoalbuminemia due to protein-calorie malnutrition ([place])   . Abnormality of gait   . Leg edema   . Hip fracture ([place]) 03/30/2017  . Leukocytosis 03/29/2017  . Mild intermittent asthma with acute exacerbation 01/29/2017  . Chronic renal insufficiency 09/22/2015  . Tachycardia 09/22/2015  . PVC (premature ventricular contraction) 09/22/2015  . Osteoporosis 06/03/2014  . Basal cell carcinoma of face 06/03/2014  . Benign prostatic hyperplasia 05/27/2013  . Eczema 04/23/2012  . Dyslipidemia 04/23/2012   Past Medical History:  Past Medical History:  Diagnosis Date  . Allergy    seasonal  fall  . Cancer ([place])    Basal cell carcinoma; multiple  . Eczema   . Hyperlipidemia   . Hypertension   . Osteoporosis   . Stroke [facility])    Past Surgical History:  Past Surgical History:  Procedure Laterality Date  . CATARACT EXTRACTION, BILATERAL  06/29/2015  . INTRAMEDULLARY (IM) NAIL INTERTROCHANTERIC Left 03/30/2017   Procedure: INTRAMEDULLARY (IM) NAIL INTERTROCHANTRIC;  Surgeon: [name], MD;  Location: [place];  Service: Orthopedics;  Laterality: Left;  . INTRAMEDULLARY (IM) NAIL INTERTROCHANTERIC Right 11/12/2018   Procedure: INTRAMEDULLARY (IM) NAIL RIGHT INTERTROCHANTERIC HIP FRACTURE;  Surgeon: [name],  MD;  Location: [place];  Service: Orthopedics;  Laterality: Right;  . MOHS SURGERY    . TONSILLECTOMY  1940 maybe   HPI:  Pt is a 85 y/o male presenting with Rsided weakness and dysarthria. CT negative. PMH: HTN, CVA, osteoprorosis, IM nail R hip. MR head was negative for acute intrcranial abnormalities and EEG was negative for seizure activity.   Assessment / Plan / Recommendation Clinical Impression  Patient presents with cognitive-linguistic and speech abilities that are [facility] and appear to be at his baseline. Patient did not present with any noticeable facial or lingual assymetry, did not present with significant dysarthria, aside from a mild vocal tremor. Patient's memory, reasoning, verbal problem solving and safety awareness were all judged by SLP to be [facility]. Patient premorbidly has a personal care aide/attendent who helps with his ADL's and is present 24 hours a day and was receiving home health PT.     SLP Assessment  SLP Recommendation/Assessment: Patient does not need any further Speech Lanaguage Pathology Services SLP Visit Diagnosis: Cognitive communication deficit (R41.841)    Follow Up Recommendations  None    Frequency and Duration   N/A        SLP Evaluation Cognition  Overall Cognitive Status: Within Functional Limits for tasks assessed Arousal/Alertness: Awake/alert Orientation Level: Oriented X4 Memory: Appears intact Awareness: Appears intact Problem Solving: Appears intact Executive Function: Reasoning;Decision Making Reasoning: Appears intact Decision Making: Appears intact Safety/Judgment: Appears intact       Comprehension  Auditory Comprehension Overall Auditory Comprehension: Appears within functional limits for tasks assessed    Expression Expression Primary Mode of  Expression: Verbal Verbal Expression Overall Verbal Expression: Appears within functional limits for tasks assessed Written Expression Dominant Hand: Right   Oral / Motor  Oral  Motor/Sensory Function Overall Oral Motor/Sensory Function: Within functional limits(mild vocal tremor) Motor Speech Overall Motor Speech: Appears within functional limits for tasks assessed   GO                    Jakye, Mullens 03/08/2019, 4:33 PM    [name], MA, [place] Pager: [phone]

## 2019-03-08 NOTE — ED Notes (Signed)
Attempted to call report

## 2019-03-08 NOTE — Progress Notes (Signed)
EEG completed, results pending. 

## 2019-03-08 NOTE — Progress Notes (Signed)
OT Cancellation Note  Patient Details Name: [name] MRN: [id number] DOB: April 03, 1932   Cancelled Treatment:    Reason Eval/Treat Not Completed: Patient at procedure or test/ unavailable, at MRI. Will follow and initiate OT eval when able.   Delight Stare, OT Acute Rehabilitation Services Pager [phone] Office [phone]  Delight Stare 03/08/2019, 9:24 AM

## 2019-03-08 NOTE — Evaluation (Signed)
Occupational Therapy Evaluation Patient Details Name: [name] MRN: [id number] DOB: [birth year] Today's Date: 03/08/2019    History of Present Illness Pt is a 85 y/o male presenting with Rsided weakness and dysarthria. CT negative. PMH: HTN, CVA, osteoprorosis, IM nail R hip.    Clinical Impression   PTA patient reports living alone, but having 24/7 caregiver assisting him as needed with ADLs, IADLs and using RW for mobility.  Patient admitted for above and limited by problem list below, including impaired balance, generalized weakness and decreased activity tolerance.  Patient currently completes bed mobility with min guard to min assist, toilet transfers with min assist, grooming standing at sink with min guard assist and LB ADls with min assist. Patient will benefit from continued OT services while admitted and after dc at [facility] level in order to optimize independence with ADLs/mobility. Will follow.     Follow Up Recommendations  Home health OT;Supervision/Assistance - 24 hour    Equipment Recommendations  None recommended by OT    Recommendations for Other Services       Precautions / Restrictions Precautions Precautions: Fall Restrictions Weight Bearing Restrictions: No      Mobility Bed Mobility Overal bed mobility: Needs Assistance Bed Mobility: Supine to Sit;Sit to Supine     Supine to sit: Min assist Sit to supine: Min guard   General bed mobility comments: min assist to fully ascend trunk to EOB, min guard for safety returning to supine   Transfers Overall transfer level: Needs assistance Equipment used: Rolling walker (2 wheeled) Transfers: Sit to/from Stand Sit to Stand: Min assist         General transfer comment: min assist to power up into standing, balance and safety     Balance Overall balance assessment: Needs assistance Sitting-balance support: No upper extremity supported;Feet supported Sitting balance-Leahy Scale: Fair     Standing balance  support: During functional activity;Bilateral upper extremity supported Standing balance-Leahy Scale: Poor Standing balance comment: reliant on UB support during mobility, leaning onto sink for support during grooming tasks                            ADL either performed or assessed with clinical judgement   ADL Overall ADL's : Needs assistance/impaired     Grooming: Min guard;Standing;Oral care Grooming Details (indicate cue type and reason): min guard for safety standing at sink Upper Body Bathing: Set up;Sitting   Lower Body Bathing: Sit to/from stand;Minimal assistance   Upper Body Dressing : Minimal assistance;Sitting   Lower Body Dressing: Minimal assistance;Sit to/from stand Lower Body Dressing Details (indicate cue type and reason): able to don socks using figure 4 techinque, min assist sit<>Stand  Toilet Transfer: Minimal assistance;Ambulation;RW Toilet Transfer Details (indicate cue type and reason): simulated in room         Functional mobility during ADLs: Min guard;Rolling walker General ADL Comments: pt limited by impaired balance, decreased activity tolerance and generalized weakness     Vision   Vision Assessment?: No apparent visual deficits     Perception     Praxis      Pertinent Vitals/Pain Pain Assessment: No/denies pain     Hand Dominance Right   Extremity/Trunk Assessment Upper Extremity Assessment Upper Extremity Assessment: Generalized weakness   Lower Extremity Assessment Lower Extremity Assessment: Defer to PT evaluation       Communication Communication Communication: HOH   Cognition Arousal/Alertness: Awake/alert Behavior During Therapy: WFL for tasks assessed/performed Overall Cognitive  Status: Impaired/Different from baseline Area of Impairment: Memory;Orientation                 Orientation Level: Disoriented to;Situation   Memory: Decreased short-term memory         General Comments: pt pleasant,  disoriented to situation but able to follow commands throughout session; decreased recall of working with PT earlier today    General Comments       Exercises     Shoulder Instructions      Home Living Family/patient expects to be discharged to:: Private residence Living Arrangements: Alone Available Help at Discharge: Available 24 hours/day;Personal care attendant Type of Home: House Home Access: Stairs to enter [employer] of Steps: 6-7 Entrance Stairs-Rails: Right;Left;Can reach both Home Layout: One level     Bathroom Shower/Tub: Teacher, early years/pre: Standard     Home Equipment: Tub bench;Cane - single point;Walker - 2 wheels;Bedside commode          Prior Functioning/Environment Level of Independence: Needs assistance  Gait / Transfers Assistance Needed: uses RW and sometimes w/c ADL's / Homemaking Assistance Needed: caregiver assist with dressing/bathing as needed, cooking, grocery shopping   Comments: reports caregiver is present 24/7        OT Problem List: Decreased strength;Decreased activity tolerance;Impaired balance (sitting and/or standing);Decreased knowledge of use of DME or AE;Decreased safety awareness;Decreased knowledge of precautions      OT Treatment/Interventions: Self-care/ADL training;DME and/or AE instruction;Therapeutic exercise;Therapeutic activities;Patient/family education;Balance training    OT Goals(Current goals can be found in the care plan section) Acute Rehab OT Goals Patient Stated Goal: to get back home  OT Goal Formulation: With patient Time For Goal Achievement: 03/22/19 Potential to Achieve Goals: Good  OT Frequency: Min 2X/week   Barriers to D/C:            Co-evaluation              AM-PAC OT "6 Clicks" Daily Activity     Outcome Measure Help from another person eating meals?: None Help from another person taking care of personal grooming?: A Little Help from another person  toileting, which includes using toliet, bedpan, or urinal?: A Little Help from another person bathing (including washing, rinsing, drying)?: A Little Help from another person to put on and taking off regular upper body clothing?: A Little Help from another person to put on and taking off regular lower body clothing?: A Little 6 Click Score: 19   End of Session Equipment Utilized During Treatment: Rolling walker Nurse Communication: Mobility status  Activity Tolerance: Patient tolerated treatment well Patient left: in bed;with call bell/phone within reach;with bed alarm set  OT Visit Diagnosis: Other abnormalities of gait and mobility (R26.89);Muscle weakness (generalized) (M62.81)                Time: 3790-2409 OT Time Calculation (min): 17 min Charges:  OT General Charges $OT Visit: 1 Visit OT Evaluation $OT Eval Low Complexity: [place], OT Acute Rehabilitation Services Pager [phone] Office [phone]   Delight Stare 03/08/2019, 2:03 PM

## 2019-03-09 DIAGNOSIS — R479 Unspecified speech disturbances: Secondary | ICD-10-CM | POA: Diagnosis not present

## 2019-03-09 LAB — CBC
HCT: 45.9 % (ref 39.0–52.0)
Hemoglobin: 15.8 g/dL (ref 13.0–17.0)
MCH: 30.3 pg (ref 26.0–34.0)
MCHC: 34.4 g/dL (ref 30.0–36.0)
MCV: 87.9 fL (ref 80.0–100.0)
Platelets: 194 10*3/uL (ref 150–400)
RBC: 5.22 MIL/uL (ref 4.22–5.81)
RDW: 12.7 % (ref 11.5–15.5)
WBC: 7.9 10*3/uL (ref 4.0–10.5)
nRBC: 0 % (ref 0.0–0.2)

## 2019-03-09 LAB — BASIC METABOLIC PANEL
Anion gap: 8 (ref 5–15)
BUN: 10 mg/dL (ref 8–23)
CO2: 28 mmol/L (ref 22–32)
Calcium: 9.3 mg/dL (ref 8.9–10.3)
Chloride: 98 mmol/L (ref 98–111)
Creatinine, Ser: 1.03 mg/dL (ref 0.61–1.24)
GFR calc Af Amer: >60 mL/min (ref >60)
GFR calc non Af Amer: >60 mL/min (ref >60)
Glucose, Bld: 91 mg/dL (ref 70–99)
Potassium: 4.3 mmol/L (ref 3.5–5.1)
Sodium: 134 mmol/L — ABNORMAL LOW (ref 135–145)

## 2019-03-09 MED ORDER — CAMPHOR-MENTHOL 0.5-0.5 % EX LOTN
TOPICAL_LOTION | CUTANEOUS | Status: DC | PRN
  Filled 2019-03-09: qty 222

## 2019-03-09 MED ORDER — SENNOSIDES-DOCUSATE SODIUM 8.6-50 MG PO TABS: 1.0000 | ORAL_TABLET | Freq: Every evening | ORAL | Status: DC | PRN

## 2019-03-09 MED ORDER — DOCUSATE SODIUM 100 MG PO CAPS: 100.0000 mg | ORAL_CAPSULE | Freq: Every day | ORAL | 0 refills | Status: DC

## 2019-03-09 MED ORDER — CLOPIDOGREL BISULFATE 75 MG PO TABS: 75.0000 mg | ORAL_TABLET | Freq: Every day | ORAL | 0 refills | Status: DC

## 2019-03-09 NOTE — Progress Notes (Signed)
Occupational Therapy Treatment Patient Details Name: [name] MRN: [id number] DOB: October 28, 1932 Today's Date: 03/09/2019    History of present illness Pt is a 85 y/o male presenting with Rsided weakness and dysarthria. CT negative. PMH: HTN, CVA, osteoprorosis, IM nail R hip.    OT comments  Pt handoff from PT.  Continued education on safety and recommendations, energy conservation and pacing.  Able to complete grooming at sink with min guard assist with 0 hand support (when supporting self with 1 hand able to decreased to close supervision assist).  Pt fatigues easily, min guard for mobility in room, min guard for basic transfers given cueing for hand placement and safety.  Agreeable to hands on support during ADLs and mobility at dc, has 24/7.  Continue HHOT after dc.  Will follow.    Follow Up Recommendations  Home health OT;Supervision/Assistance - 24 hour    Equipment Recommendations  None recommended by OT    Recommendations for Other Services      Precautions / Restrictions Precautions Precautions: Fall Restrictions Weight Bearing Restrictions: No       Mobility Bed Mobility Overal bed mobility: Needs Assistance Bed Mobility: Supine to Sit     Supine to sit: Supervision     General bed mobility comments: OOB upon entry  Transfers Overall transfer level: Needs assistance Equipment used: Rolling walker (2 wheeled) Transfers: Sit to/from Stand Sit to Stand: Min guard         General transfer comment: cueing for hand placement and safety, increased time with min guard for safety/balance    Balance Overall balance assessment: Needs assistance Sitting-balance support: No upper extremity supported;Feet supported Sitting balance-Leahy Scale: Fair     Standing balance support: During functional activity;No upper extremity supported Standing balance-Leahy Scale: Poor Standing balance comment: reliant on UB support during mobility, leaning onto sink for support  during grooming tasks                            ADL either performed or assessed with clinical judgement   ADL Overall ADL's : Needs assistance/impaired     Grooming: Min guard;Standing;Wash/dry hands;Oral care Grooming Details (indicate cue type and reason): min guard for safety, unsteady with 0 hand support and preference to UE support                  Toilet Transfer: Min guard;Ambulation;RW Toilet Transfer Details (indicate cue type and reason): simulated to recliner          Functional mobility during ADLs: Min guard;Rolling walker General ADL Comments: pt fatigued from PT session, agreeable to hands on assist at home with ADls/mobility     Vision       Perception     Praxis      Cognition Arousal/Alertness: Awake/alert Behavior During Therapy: WFL for tasks assessed/performed Overall Cognitive Status: Within Functional Limits for tasks assessed                                 General Comments: anticipate decreased STM is baseline, good recall of safety precautions and recommendations for 24/7 support        Exercises     Shoulder Instructions       General Comments      Pertinent Vitals/ Pain       Pain Assessment: Faces Faces Pain Scale: Hurts a little bit Pain Location: R hip  Pain Descriptors / Indicators: Discomfort;Grimacing Pain Intervention(s): Repositioned;Monitored during session  Home Living                                          Prior Functioning/Environment              Frequency  Min 2X/week        Progress Toward Goals  OT Goals(current goals can now be found in the care plan section)  Progress towards OT goals: Progressing toward goals  Acute Rehab OT Goals Patient Stated Goal: to get back home  OT Goal Formulation: With patient  Plan Discharge plan remains appropriate;Frequency remains appropriate    Co-evaluation                 AM-PAC OT "6 Clicks"  Daily Activity     Outcome Measure   Help from another person eating meals?: None Help from another person taking care of personal grooming?: A Little Help from another person toileting, which includes using toliet, bedpan, or urinal?: A Little Help from another person bathing (including washing, rinsing, drying)?: A Little Help from another person to put on and taking off regular upper body clothing?: None Help from another person to put on and taking off regular lower body clothing?: A Little 6 Click Score: 20    End of Session Equipment Utilized During Treatment: Rolling walker  OT Visit Diagnosis: Other abnormalities of gait and mobility (R26.89);Muscle weakness (generalized) (M62.81)   Activity Tolerance Patient tolerated treatment well   Patient Left with call bell/phone within reach;in chair;with chair alarm set   Nurse Communication Mobility status        Time: 7342-8768 OT Time Calculation (min): 15 min  Charges: OT General Charges $OT Visit: 1 Visit OT Treatments $Self Care/Home Management : 8-22 mins  Delight Stare, [place] Pager [phone] Office [phone]    Delight Stare 03/09/2019, 12:59 PM

## 2019-03-09 NOTE — Progress Notes (Signed)
Physical Therapy Treatment Patient Details Name: [name] MRN: [id number] DOB: 22-Aug-1932 Today's Date: 03/09/2019    History of Present Illness Pt is a 85 y/o male presenting with Rsided weakness and dysarthria. CT negative. PMH: HTN, CVA, osteoprorosis, IM nail R hip.     PT Comments    Pt performed gait training with RW and progression to stair training.  Pt with noticeable fatigue but refused rest break.  Pt reports,"I've got to do this."  Obtained SPO2 back in room seated in chair and 89% on RA.  Pt required lengthy rest break to recover.  PTA educated on pacing and energy conservation.  OT dovetailed session.  Plan for d/c home today to resume HHPT.     Follow Up Recommendations  Home health PT;Supervision/Assistance - 24 hour     Equipment Recommendations  None recommended by PT    Recommendations for Other Services       Precautions / Restrictions Precautions Precautions: Fall Restrictions Weight Bearing Restrictions: No    Mobility  Bed Mobility Overal bed mobility: Needs Assistance Bed Mobility: Supine to Sit     Supine to sit: Supervision     General bed mobility comments: Increased time and effort to advance LEs to edge of bed.  Pt utilized rails for assistance.    Transfers Overall transfer level: Needs assistance Equipment used: Rolling walker (2 wheeled) Transfers: Sit to/from Stand Sit to Stand: Min assist         General transfer comment: Cues for hand placement to and from seated surface.  pt intially reaching for RW to pull into standing.    Ambulation/Gait Ambulation/Gait assistance: Min assist Gait Distance (Feet): 200 Feet Assistive device: Rolling walker (2 wheeled) Gait Pattern/deviations: Step-through pattern;Decreased stride length;Shuffle;Decreased dorsiflexion - left     General Gait Details: Pt has a tendency to push device too far forward when fatigued.  he presents with DOE.  Reading 89% on RA.  Cues to step closer to device  and improved upper trunk/head control.     Stairs Stairs: Yes Stairs assistance: Mod assist Stair Management: One rail Right;Step to pattern;Backwards;Forwards Number of Stairs: 3 General stair comments: Cues for sequencing and hand placement on railing.  Pt adamant to ascend stair with RLE despite cues to use L.  After negotiating first stair with difficulty he was agreeable to try with LLE and reports it is much easier.  Pt too fatigued to complete more than three stairs.  He reports his caregiver can help him into his home.     Wheelchair Mobility    Modified Rankin (Stroke Patients Only)       Balance Overall balance assessment: Needs assistance Sitting-balance support: No upper extremity supported;Feet supported Sitting balance-Leahy Scale: Fair       Standing balance-Leahy Scale: Poor Standing balance comment: reliant on UB support during mobility, leaning onto sink for support during grooming tasks                             Cognition Arousal/Alertness: Awake/alert Behavior During Therapy: WFL for tasks assessed/performed Overall Cognitive Status: Within Functional Limits for tasks assessed                                 General Comments: Pt aware he was discharging home today.  Appears within functional limits not formally assessed.        Exercises  General Comments        Pertinent Vitals/Pain Pain Assessment: Faces Faces Pain Scale: Hurts little more Pain Location: R hip Pain Descriptors / Indicators: Discomfort;Grimacing Pain Intervention(s): Monitored during session;Repositioned    Home Living                      Prior Function            PT Goals (current goals can now be found in the care plan section) Acute Rehab PT Goals Patient Stated Goal: to get back home  Potential to Achieve Goals: Good Progress towards PT goals: Progressing toward goals    Frequency    Min 4X/week      PT Plan  Current plan remains appropriate    Co-evaluation              AM-PAC PT "6 Clicks" Mobility   Outcome Measure  Help needed turning from your back to your side while in a flat bed without using bedrails?: A Little Help needed moving from lying on your back to sitting on the side of a flat bed without using bedrails?: A Little Help needed moving to and from a bed to a chair (including a wheelchair)?: A Little Help needed standing up from a chair using your arms (e.g., wheelchair or bedside chair)?: A Little Help needed to walk in hospital room?: A Lot Help needed climbing 3-5 steps with a railing? : A Lot 6 Click Score: 16    End of Session Equipment Utilized During Treatment: Gait belt Activity Tolerance: Patient tolerated treatment well Patient left: in chair(seated in front of sink with OT who dovetailed session.  ) Nurse Communication: Mobility status PT Visit Diagnosis: Unsteadiness on feet (R26.81);Muscle weakness (generalized) (M62.81);Difficulty in walking, not elsewhere classified (R26.2)     Time: 8756-4332 PT Time Calculation (min) (ACUTE ONLY): 18 min  Charges:  $Gait Training: 8-22 mins                     Governor Rooks, PTA Acute Rehabilitation Services Pager [phone] Office [phone]     Gissell Barra Eli Hose 03/09/2019, 11:39 AM

## 2019-03-09 NOTE — TOC Transition Note (Signed)
Transition of Care [facility]) - CM/SW Discharge Note   Patient Details  Name: Hersey Maclellan MRN: [id number] Date of Birth: 01/27/1932  [facility] CM/SW Contact:  Bethena Roys, RN Phone Number: 03/09/2019, 10:25 AM   Clinical Narrative:  Pt plans to transition home today-Daughter will provide transportation after 4:00 pm. Daughter wanted to make sure that the Gold DNR travels with patient home. Cassie with [facility] is aware that patient will transition home to initiate [place]. No further needs from CM @ this time.      Barriers to Discharge: Continued Medical Work up   Patient Goals and CMS Choice   CMS Medicare.gov Compare Post Acute Care list provided to:: Patient Represenative (must comment)(daughter) Choice offered to / list presented to : Adult Children   Discharge Plan and Services   Discharge Planning Services: CM Consult Post Acute Care Choice: Home Health        HH Arranged: PT, RN, OT [facility] Agency: [facility] Date [place]: 03/08/19 Time [place]: [place] Representative spoke with at [place]: Cassie  Social Determinants of Health ([place]) Interventions     Readmission Risk Interventions No flowsheet data found.

## 2019-03-09 NOTE — Discharge Summary (Signed)
Physician Discharge Summary  [name] IRW:[id number] DOB: 10-25-1932 DOA: 03/07/2019  PCP: Rutherford Guys, MD  Admit date: 03/07/2019 Discharge date: 03/09/2019  Admitted From: home Discharge disposition: home   Recommendations for Outpatient Follow-Up:   1. Home health and 24 hour supervision   Discharge Diagnosis:   Principal Problem:   TIA (transient ischemic attack) Active Problems:   Dyslipidemia   Osteoporosis   Chronic renal insufficiency   Hip fracture (HCC)   Essential hypertension   History of CVA (cerebrovascular accident)   Impaired mobility   Hyponatremia    Discharge Condition: Improved.  Diet recommendation: Low sodium, heart healthy.    Wound care: None.  Code status: Full.   History of Present Illness:   [name] is a 85 y.o. male with medical history significant of previous caudate head CVA, hypertension, hyperlipidemia, recent right hip fracture status post repair, osteoporosis and basal cell carcinoma who was brought to the ER with a code stroke that started around 50 PM tonight.  Patient came within 2 hours of the onset of the symptoms but on arrival was evaluated by neurology and EDP.  Stroke scale was only 1 with mild dysarthria so patient did not qualify for TPA.  He has had right-sided weakness including facial weakness in route according to EMS.  He appears to be weak generally.  Initial evaluation including head CT without contrast was negative.  CTA of the head and neck is also negative for large vessel occlusion.  He appears to have some carotid artery stenosis but notes critical.  Patient is suspected to have either have a TIA or seizure disorder or worsening of his previous CVA.  Neurology recommends admission for TIA work-up and is being admitted to the medical service.Marland Kitchen   Hospital Course by Problem:   TIA from small vessel disease. Aspirin and Plavix for 3 weeks followed by Plavix alone.   hyperlipidemia:  -Continue with  statin as above.   hypertension:  -continue with home regimen.  chronic kidney disease stage III:  -Appears to be at baseline  Hyponatremia -improved with IVF -per daughter drinks lots of water  Medical Consultants:      Discharge Exam:   Vitals:   03/09/19 0414 03/09/19 0800  BP: (!) 134/102 (!) 165/94  Pulse: 65 69  Resp: 17 17  Temp: 97.6 F (36.4 C) 97.6 F (36.4 C)  SpO2: 98% 96%   Vitals:   03/08/19 2006 03/08/19 2331 03/09/19 0414 03/09/19 0800  BP: (!) 145/74 (!) 151/69 (!) 134/102 (!) 165/94  Pulse: 60 62 65 69  Resp: 20  17 17   Temp: (!) 97.4 F (36.3 C) (!) 97.3 F (36.3 C) 97.6 F (36.4 C) 97.6 F (36.4 C)  TempSrc: Oral Oral Oral Oral  SpO2: 98% 96% 98% 96%  Weight:        General exam: Appears calm and comfortable.   The results of significant diagnostics from this hospitalization (including imaging, microbiology, ancillary and laboratory) are listed below for reference.     Procedures and Diagnostic Studies:   Ct Angio Head W Or Wo Contrast  Result Date: 03/07/2019 CLINICAL DATA:  Aphasia EXAM: CT ANGIOGRAPHY HEAD AND NECK TECHNIQUE: Multidetector CT imaging of the head and neck was performed using the standard protocol during bolus administration of intravenous contrast. Multiplanar CT image reconstructions and MIPs were obtained to evaluate the vascular anatomy. Carotid stenosis measurements (when applicable) are obtained utilizing NASCET criteria, using the distal internal carotid diameter  as the denominator. CONTRAST:  66mL OMNIPAQUE IOHEXOL 350 MG/ML SOLN COMPARISON:  Head CT same day and 11/16/2018 FINDINGS: CTA NECK FINDINGS SKELETON: There is no bony spinal canal stenosis. No lytic or blastic lesion. OTHER NECK: Large hypodense left thyroid nodule measures 4.0 x 4.9 cm. UPPER CHEST: No pneumothorax or pleural effusion. No nodules or masses. AORTIC ARCH: There is mild calcific atherosclerosis of the aortic arch. There is no aneurysm,  dissection or hemodynamically significant stenosis of the visualized ascending aorta and aortic arch. Conventional 3 vessel aortic branching pattern. The visualized proximal subclavian arteries are widely patent. RIGHT CAROTID SYSTEM: --Common carotid artery: Widely patent origin without common carotid artery dissection or aneurysm. --Internal carotid artery: No dissection, occlusion or aneurysm. There is mixed density atherosclerosis extending into the proximal ICA, resulting in 60% stenosis. --External carotid artery: No acute abnormality. LEFT CAROTID SYSTEM: --Common carotid artery: Widely patent origin without common carotid artery dissection or aneurysm. --Internal carotid artery: No dissection, occlusion or aneurysm. There is mixed density atherosclerosis extending into the proximal ICA, resulting in 50% stenosis. --External carotid artery: No acute abnormality. VERTEBRAL ARTERIES: Left dominant configuration. There is atherosclerotic calcification at the right vertebral artery origin causing at least moderate narrowing. The left vertebral artery origin is normal. No dissection, occlusion or flow-limiting stenosis to the vertebrobasilar confluence. CTA HEAD FINDINGS POSTERIOR CIRCULATION: --Vertebral arteries: Normal codominant configuration of V4 segments. --Posterior inferior cerebellar arteries (PICA): Common AICA PICA origins bilaterally. --Anterior inferior cerebellar arteries (AICA): Patent origins from the basilar artery. --Basilar artery: Normal. --Superior cerebellar arteries: Normal. --Posterior cerebral arteries (PCA): Normal. Both originate from the basilar artery. Posterior communicating arteries (p-comm) are diminutive or absent. ANTERIOR CIRCULATION: --Intracranial internal carotid arteries: Normal. --Anterior cerebral arteries (ACA): Normal. Both A1 segments are present. Patent anterior communicating artery (a-comm). --Middle cerebral arteries (MCA): Normal. VENOUS SINUSES: As permitted by  contrast timing, patent. ANATOMIC VARIANTS: None Review of the MIP images confirms the above findings. IMPRESSION: 1. No emergent large vessel occlusion. 2. Mixed density atherosclerotic disease at both carotid bifurcations causing stenosis of 60% on the right and 50% on the left. Electronically Signed   By: Ulyses Jarred M.D.   On: 03/07/2019 23:47   Ct Angio Neck W Or Wo Contrast  Result Date: 03/07/2019 CLINICAL DATA:  Aphasia EXAM: CT ANGIOGRAPHY HEAD AND NECK TECHNIQUE: Multidetector CT imaging of the head and neck was performed using the standard protocol during bolus administration of intravenous contrast. Multiplanar CT image reconstructions and MIPs were obtained to evaluate the vascular anatomy. Carotid stenosis measurements (when applicable) are obtained utilizing NASCET criteria, using the distal internal carotid diameter as the denominator. CONTRAST:  45mL OMNIPAQUE IOHEXOL 350 MG/ML SOLN COMPARISON:  Head CT same day and 11/16/2018 FINDINGS: CTA NECK FINDINGS SKELETON: There is no bony spinal canal stenosis. No lytic or blastic lesion. OTHER NECK: Large hypodense left thyroid nodule measures 4.0 x 4.9 cm. UPPER CHEST: No pneumothorax or pleural effusion. No nodules or masses. AORTIC ARCH: There is mild calcific atherosclerosis of the aortic arch. There is no aneurysm, dissection or hemodynamically significant stenosis of the visualized ascending aorta and aortic arch. Conventional 3 vessel aortic branching pattern. The visualized proximal subclavian arteries are widely patent. RIGHT CAROTID SYSTEM: --Common carotid artery: Widely patent origin without common carotid artery dissection or aneurysm. --Internal carotid artery: No dissection, occlusion or aneurysm. There is mixed density atherosclerosis extending into the proximal ICA, resulting in 60% stenosis. --External carotid artery: No acute abnormality. LEFT CAROTID SYSTEM: --Common carotid  artery: Widely patent origin without common carotid  artery dissection or aneurysm. --Internal carotid artery: No dissection, occlusion or aneurysm. There is mixed density atherosclerosis extending into the proximal ICA, resulting in 50% stenosis. --External carotid artery: No acute abnormality. VERTEBRAL ARTERIES: Left dominant configuration. There is atherosclerotic calcification at the right vertebral artery origin causing at least moderate narrowing. The left vertebral artery origin is normal. No dissection, occlusion or flow-limiting stenosis to the vertebrobasilar confluence. CTA HEAD FINDINGS POSTERIOR CIRCULATION: --Vertebral arteries: Normal codominant configuration of V4 segments. --Posterior inferior cerebellar arteries (PICA): Common AICA PICA origins bilaterally. --Anterior inferior cerebellar arteries (AICA): Patent origins from the basilar artery. --Basilar artery: Normal. --Superior cerebellar arteries: Normal. --Posterior cerebral arteries (PCA): Normal. Both originate from the basilar artery. Posterior communicating arteries (p-comm) are diminutive or absent. ANTERIOR CIRCULATION: --Intracranial internal carotid arteries: Normal. --Anterior cerebral arteries (ACA): Normal. Both A1 segments are present. Patent anterior communicating artery (a-comm). --Middle cerebral arteries (MCA): Normal. VENOUS SINUSES: As permitted by contrast timing, patent. ANATOMIC VARIANTS: None Review of the MIP images confirms the above findings. IMPRESSION: 1. No emergent large vessel occlusion. 2. Mixed density atherosclerotic disease at both carotid bifurcations causing stenosis of 60% on the right and 50% on the left. Electronically Signed   By: Ulyses Jarred M.D.   On: 03/07/2019 23:47   Mr Brain Wo Contrast  Result Date: 03/08/2019 CLINICAL DATA:  Code stroke.  Mild dysarthria, RIGHT-sided weakness. EXAM: MRI HEAD WITHOUT CONTRAST TECHNIQUE: Multiplanar, multiecho pulse sequences of the brain and surrounding structures were obtained without intravenous contrast.  COMPARISON:  Stroke imaging including CT head and CTA head neck 03/07/2019. MR head 01/17/2018. FINDINGS: Brain: No evidence for acute infarction, hemorrhage, mass lesion, hydrocephalus, or extra-axial fluid. Generalized atrophy. T2 and FLAIR hyperintensities throughout the white matter, likely small vessel disease. Chronic RIGHT basal ganglia infarct, with mild chronic blood products, was acute in 2019. Chronic BILATERAL cerebellar infarcts. Vascular: Flow voids are maintained throughout the carotid, basilar, and vertebral arteries. Skull and upper cervical spine: Unremarkable visualized calvarium, skullbase, and cervical vertebrae. Pituitary, pineal, cerebellar tonsils unremarkable. No upper cervical cord lesions. Sinuses/Orbits: No orbital masses or proptosis. Globes appear symmetric. Sinuses appear well aerated, without evidence for air-fluid level. Other: None. IMPRESSION: Atrophy and small vessel disease. Remote areas of cerebral ischemia. No acute intracranial findings. Electronically Signed   By: Staci Righter M.D.   On: 03/08/2019 09:47   Ct Head Code Stroke Wo Contrast  Result Date: 03/07/2019 CLINICAL DATA:  Code stroke.  Aphasia EXAM: CT HEAD WITHOUT CONTRAST TECHNIQUE: Contiguous axial images were obtained from the base of the skull through the vertex without intravenous contrast. COMPARISON:  11/16/2018 head CT FINDINGS: Brain: There is no mass, hemorrhage or extra-axial collection. There is generalized atrophy without lobar predilection. Areas of hypoattenuation of the deep gray nuclei and confluent periventricular white matter hypodensity, consistent with chronic small vessel disease. Old bilateral basal ganglia lacunar infarcts. There are old bilateral cerebellar small vessel infarcts. Vascular: No abnormal hyperdensity of the major intracranial arteries or dural venous sinuses. No intracranial atherosclerosis. Skull: The visualized skull base, calvarium and extracranial soft tissues are normal.  Sinuses/Orbits: No fluid levels or advanced mucosal thickening of the visualized paranasal sinuses. No mastoid or middle ear effusion. The orbits are normal. ASPECTS [facility] Stroke Program Early CT Score) - Ganglionic level infarction (caudate, lentiform nuclei, internal capsule, insula, M1-M3 cortex): 7 - Supraganglionic infarction (M4-M6 cortex): 3 Total score (0-10 with 10 being normal): 10 IMPRESSION: 1. No acute  intracranial hemorrhage. 2. Multiple old small vessel infarcts and findings of chronic ischemic microangiopathy. 3. ASPECTS is 10. These results were communicated to Dr. Amie Portland at 11:25 pm on 03/07/2019 by text page via the [facility] messaging system. Electronically Signed   By: Ulyses Jarred M.D.   On: 03/07/2019 23:26     Labs:   Basic Metabolic Panel: Recent Labs  Lab 03/07/19 2314 03/07/19 2319 03/08/19 0353 03/09/19 0739  NA 129*  --  131* 134*  K 4.9  --  4.6 4.3  CL 96*  --  94* 98  CO2 23  --  28 28  GLUCOSE 96  --  100* 91  BUN 13  --  11 10  CREATININE 0.99 1.00 1.04 1.03  CALCIUM 9.0  --  9.1 9.3   GFR Estimated Creatinine Clearance: 61.8 mL/min (by C-G formula based on SCr of 1.03 mg/dL). Liver Function Tests: Recent Labs  Lab 03/07/19 2314 03/08/19 0353  AST 22 17  ALT 15 15  ALKPHOS 86 80  BILITOT 0.7 0.7  PROT 6.8 6.6  ALBUMIN 3.6 3.4*   No results for input(s): LIPASE, AMYLASE in the last 168 hours. No results for input(s): AMMONIA in the last 168 hours. Coagulation profile Recent Labs  Lab 03/07/19 2314  INR 1.1    CBC: Recent Labs  Lab 03/07/19 2314 03/08/19 0353 03/09/19 0739  WBC 10.0 11.3* 7.9  NEUTROABS 6.6  --   --   HGB 15.5 15.3 15.8  HCT 45.9 44.4 45.9  MCV 90.4 88.1 87.9  PLT 209 212 194   Cardiac Enzymes: No results for input(s): CKTOTAL, CKMB, CKMBINDEX, TROPONINI in the last 168 hours. BNP: Invalid input(s): POCBNP CBG: Recent Labs  Lab 03/07/19 2345  GLUCAP 104*   D-Dimer No results for input(s): DDIMER  in the last 72 hours. Hgb A1c Recent Labs    03/08/19 0353  HGBA1C 5.6   Lipid Profile Recent Labs    03/08/19 0353  CHOL 153  HDL 66  LDLCALC 80  TRIG 36  CHOLHDL 2.3   Thyroid function studies No results for input(s): TSH, T4TOTAL, T3FREE, THYROIDAB in the last 72 hours.  Invalid input(s): FREET3 Anemia work up No results for input(s): VITAMINB12, FOLATE, FERRITIN, TIBC, IRON, RETICCTPCT in the last 72 hours. Microbiology Recent Results (from the past 240 hour(s))  SARS Coronavirus 2 [facility] order, Performed in [facility] hospital lab)     Status: None   Collection Time: 03/07/19 11:54 PM  Result Value Ref Range Status   SARS Coronavirus 2 NEGATIVE NEGATIVE Final    Comment: (NOTE) If result is NEGATIVE SARS-CoV-2 target nucleic acids are NOT DETECTED. The SARS-CoV-2 RNA is generally detectable in upper and lower  respiratory specimens during the acute phase of infection. The lowest  concentration of SARS-CoV-2 viral copies this assay can detect is 250  copies / mL. A negative result does not preclude SARS-CoV-2 infection  and should not be used as the sole basis for treatment or other  patient management decisions.  A negative result may occur with  improper specimen collection / handling, submission of specimen other  than nasopharyngeal swab, presence of viral mutation(s) within the  areas targeted by this assay, and inadequate number of viral copies  (<250 copies / mL). A negative result must be combined with clinical  observations, patient history, and epidemiological information. If result is POSITIVE SARS-CoV-2 target nucleic acids are DETECTED. The SARS-CoV-2 RNA is generally detectable in upper and lower  respiratory specimens dur ing the acute phase of infection.  Positive  results are indicative of active infection with SARS-CoV-2.  Clinical  correlation with patient history and other diagnostic information is  necessary to determine patient infection  status.  Positive results do  not rule out bacterial infection or co-infection with other viruses. If result is PRESUMPTIVE POSTIVE SARS-CoV-2 nucleic acids MAY BE PRESENT.   A presumptive positive result was obtained on the submitted specimen  and confirmed on repeat testing.  While 2019 novel coronavirus  (SARS-CoV-2) nucleic acids may be present in the submitted sample  additional confirmatory testing may be necessary for epidemiological  and / or clinical management purposes  to differentiate between  SARS-CoV-2 and other Sarbecovirus currently known to infect humans.  If clinically indicated additional testing with an alternate test  methodology [phone]) is advised. The SARS-CoV-2 RNA is generally  detectable in upper and lower respiratory sp ecimens during the acute  phase of infection. The expected result is Negative. Fact Sheet for Patients:  StrictlyIdeas.no Fact Sheet for Healthcare Providers: BankingDealers.co.za This test is not yet approved or cleared by the [place] FDA and has been authorized for detection and/or diagnosis of SARS-CoV-2 by FDA under an Emergency Use Authorization (EUA).  This EUA will remain in effect (meaning this test can be used) for the duration of the COVID-19 declaration under Section 564(b)(1) of the Act, 21 U.S.C. section 360bbb-3(b)(1), unless the authorization is terminated or revoked sooner. Performed at [facility], [place] [address], [place], [place] [address]      Discharge Instructions:   Discharge Instructions    Diet - low sodium heart healthy   Complete by:  As directed    Discharge instructions   Complete by:  As directed    aspirin 81 mg and plavix 75 mg daily x 3 weeks, then PLAVIX alone Home health   Increase activity slowly   Complete by:  As directed      Allergies as of 03/09/2019      Reactions   Seasonal Ic [cholestatin] Other (See Comments)   Unknown to  family member      Medication List    STOP taking these medications   clobetasol ointment 0.05 % Commonly known as:  TEMOVATE   enoxaparin 40 MG/0.4ML injection Commonly known as:  LOVENOX   eucerin cream   feeding supplement (ENSURE ENLIVE) Liqd   montelukast 10 MG tablet Commonly known as:  SINGULAIR   polyethylene glycol 17 g packet Commonly known as:  MIRALAX / GLYCOLAX     TAKE these medications   albuterol 108 (90 Base) MCG/ACT inhaler Commonly known as:  VENTOLIN HFA Inhale 2 puffs into the lungs every 6 (six) hours as needed for wheezing or shortness of breath.   alendronate 70 MG tablet Commonly known as:  FOSAMAX Take 1 tablet (70 mg total) by mouth every 7 (seven) days. Take with a full glass of water on an empty stomach.   aspirin 81 MG EC tablet Take 1 tablet (81 mg total) by mouth daily.   beclomethasone 40 MCG/ACT inhaler Commonly known as:  Qvar RediHaler Inhale 1 puff into the lungs 2 (two) times daily.   clopidogrel 75 MG tablet Commonly known as:  PLAVIX Take 1 tablet (75 mg total) by mouth daily.   docusate sodium 100 MG capsule Commonly known as:  COLACE Take 1 capsule (100 mg total) by mouth daily.   metoprolol succinate 50 MG 24 hr tablet Commonly known  as:  TOPROL-XL Take 1 tablet (50 mg total) by mouth daily. Take with or immediately following a meal.   pravastatin 40 MG tablet Commonly known as:  PRAVACHOL Take 1 tablet (40 mg total) by mouth daily.   senna-docusate 8.6-50 MG tablet Commonly known as:  Senokot-S Take 1 tablet by mouth at bedtime as needed for moderate constipation. What changed:    how much to take  when to take this  reasons to take this   [facility] Use walker whenever walking, standard walker      Follow-up Information    Rutherford Guys, MD Follow up in 1 week(s).   Specialty:  Family Medicine Contact information: [address] [place] [address] [phone]        Garvin Fila, MD  Follow up.   Specialties:  Neurology, Radiology Contact information: [address] [place] [address] [phone]            Time coordinating discharge: 25 min  Signed:  Geradine Girt DO  [facility] 03/09/2019, 9:22 AM

## 2019-03-10 DIAGNOSIS — I10 Essential (primary) hypertension: Secondary | ICD-10-CM | POA: Diagnosis not present

## 2019-03-10 DIAGNOSIS — S72351D Displaced comminuted fracture of shaft of right femur, subsequent encounter for closed fracture with routine healing: Secondary | ICD-10-CM | POA: Diagnosis not present

## 2019-03-10 DIAGNOSIS — J4520 Mild intermittent asthma, uncomplicated: Secondary | ICD-10-CM | POA: Diagnosis not present

## 2019-03-10 DIAGNOSIS — Z8673 Personal history of transient ischemic attack (TIA), and cerebral infarction without residual deficits: Secondary | ICD-10-CM | POA: Diagnosis not present

## 2019-03-10 DIAGNOSIS — M810 Age-related osteoporosis without current pathological fracture: Secondary | ICD-10-CM | POA: Diagnosis not present

## 2019-03-10 DIAGNOSIS — E440 Moderate protein-calorie malnutrition: Secondary | ICD-10-CM | POA: Diagnosis not present

## 2019-03-11 ENCOUNTER — Telehealth: Payer: Self-pay | Admitting: Orthopaedic Surgery

## 2019-03-11 DIAGNOSIS — E440 Moderate protein-calorie malnutrition: Secondary | ICD-10-CM | POA: Diagnosis not present

## 2019-03-11 DIAGNOSIS — S72351D Displaced comminuted fracture of shaft of right femur, subsequent encounter for closed fracture with routine healing: Secondary | ICD-10-CM | POA: Diagnosis not present

## 2019-03-11 DIAGNOSIS — Z8673 Personal history of transient ischemic attack (TIA), and cerebral infarction without residual deficits: Secondary | ICD-10-CM | POA: Diagnosis not present

## 2019-03-11 DIAGNOSIS — M810 Age-related osteoporosis without current pathological fracture: Secondary | ICD-10-CM | POA: Diagnosis not present

## 2019-03-11 DIAGNOSIS — I10 Essential (primary) hypertension: Secondary | ICD-10-CM | POA: Diagnosis not present

## 2019-03-11 DIAGNOSIS — J4520 Mild intermittent asthma, uncomplicated: Secondary | ICD-10-CM | POA: Diagnosis not present

## 2019-03-11 NOTE — Telephone Encounter (Signed)
Received voicemail message from [facility] advised completed (OT) as ordered. Will said he need verbal orders for (OT) 2 Wk 2 and 1 Wk 3. The number to contact Will is [phone]

## 2019-03-11 NOTE — Telephone Encounter (Signed)
Verbal orders given  

## 2019-03-15 DIAGNOSIS — M810 Age-related osteoporosis without current pathological fracture: Secondary | ICD-10-CM | POA: Diagnosis not present

## 2019-03-15 DIAGNOSIS — Z8673 Personal history of transient ischemic attack (TIA), and cerebral infarction without residual deficits: Secondary | ICD-10-CM | POA: Diagnosis not present

## 2019-03-15 DIAGNOSIS — I10 Essential (primary) hypertension: Secondary | ICD-10-CM | POA: Diagnosis not present

## 2019-03-15 DIAGNOSIS — S72351D Displaced comminuted fracture of shaft of right femur, subsequent encounter for closed fracture with routine healing: Secondary | ICD-10-CM | POA: Diagnosis not present

## 2019-03-15 DIAGNOSIS — J4520 Mild intermittent asthma, uncomplicated: Secondary | ICD-10-CM | POA: Diagnosis not present

## 2019-03-15 DIAGNOSIS — E440 Moderate protein-calorie malnutrition: Secondary | ICD-10-CM | POA: Diagnosis not present

## 2019-03-16 DIAGNOSIS — S72351D Displaced comminuted fracture of shaft of right femur, subsequent encounter for closed fracture with routine healing: Secondary | ICD-10-CM | POA: Diagnosis not present

## 2019-03-16 DIAGNOSIS — Z8673 Personal history of transient ischemic attack (TIA), and cerebral infarction without residual deficits: Secondary | ICD-10-CM | POA: Diagnosis not present

## 2019-03-16 DIAGNOSIS — M810 Age-related osteoporosis without current pathological fracture: Secondary | ICD-10-CM | POA: Diagnosis not present

## 2019-03-16 DIAGNOSIS — E440 Moderate protein-calorie malnutrition: Secondary | ICD-10-CM | POA: Diagnosis not present

## 2019-03-16 DIAGNOSIS — I10 Essential (primary) hypertension: Secondary | ICD-10-CM | POA: Diagnosis not present

## 2019-03-16 DIAGNOSIS — J4520 Mild intermittent asthma, uncomplicated: Secondary | ICD-10-CM | POA: Diagnosis not present

## 2019-03-17 ENCOUNTER — Telehealth: Payer: Self-pay | Admitting: Family Medicine

## 2019-03-17 DIAGNOSIS — E440 Moderate protein-calorie malnutrition: Secondary | ICD-10-CM | POA: Diagnosis not present

## 2019-03-17 DIAGNOSIS — M810 Age-related osteoporosis without current pathological fracture: Secondary | ICD-10-CM | POA: Diagnosis not present

## 2019-03-17 DIAGNOSIS — Z8673 Personal history of transient ischemic attack (TIA), and cerebral infarction without residual deficits: Secondary | ICD-10-CM | POA: Diagnosis not present

## 2019-03-17 DIAGNOSIS — S72351D Displaced comminuted fracture of shaft of right femur, subsequent encounter for closed fracture with routine healing: Secondary | ICD-10-CM | POA: Diagnosis not present

## 2019-03-17 DIAGNOSIS — I10 Essential (primary) hypertension: Secondary | ICD-10-CM | POA: Diagnosis not present

## 2019-03-17 DIAGNOSIS — J4520 Mild intermittent asthma, uncomplicated: Secondary | ICD-10-CM | POA: Diagnosis not present

## 2019-03-17 NOTE — Telephone Encounter (Signed)
Copied from [place] [id number]. Topic: General - Other >> Mar 17, 2019  2:57 PM Lennox Solders wrote: Reason for IRS:WNIO lpn [facility] is calling and would like order for urine sample to check for UA and c/s . Pt is having confusing and incontinent. Fax to [phone]

## 2019-03-18 ENCOUNTER — Telehealth: Payer: Self-pay

## 2019-03-18 NOTE — Telephone Encounter (Signed)
Sent to [place] for review, this pt has not been seen by [place]. Last seen by Dr. Tamala Julian. No contact given to speak with [facility]

## 2019-03-18 NOTE — Telephone Encounter (Signed)
Correction, pt was via telemed with [name]. Spoke with elizabeth at [facility], given verbal orders for ua and cs, clean catch, at nx visit

## 2019-03-19 ENCOUNTER — Ambulatory Visit: Payer: Self-pay

## 2019-03-19 DIAGNOSIS — Z8673 Personal history of transient ischemic attack (TIA), and cerebral infarction without residual deficits: Secondary | ICD-10-CM | POA: Diagnosis not present

## 2019-03-19 DIAGNOSIS — S72351D Displaced comminuted fracture of shaft of right femur, subsequent encounter for closed fracture with routine healing: Secondary | ICD-10-CM | POA: Diagnosis not present

## 2019-03-19 DIAGNOSIS — E440 Moderate protein-calorie malnutrition: Secondary | ICD-10-CM | POA: Diagnosis not present

## 2019-03-19 DIAGNOSIS — Z0189 Encounter for other specified special examinations: Secondary | ICD-10-CM | POA: Diagnosis not present

## 2019-03-19 DIAGNOSIS — J4520 Mild intermittent asthma, uncomplicated: Secondary | ICD-10-CM | POA: Diagnosis not present

## 2019-03-19 DIAGNOSIS — I10 Essential (primary) hypertension: Secondary | ICD-10-CM | POA: Diagnosis not present

## 2019-03-19 DIAGNOSIS — M810 Age-related osteoporosis without current pathological fracture: Secondary | ICD-10-CM | POA: Diagnosis not present

## 2019-03-19 NOTE — Telephone Encounter (Signed)
Incoming call from Lubertha Basque from [facility].  Patient [name]  Patient of   Dr.  Colbert Ewing on thursdya.  A bruise is on his left shoulder.  Pt  Is able  to move his arm up and down.  Office number would be [phone]

## 2019-03-23 ENCOUNTER — Encounter: Payer: Self-pay | Admitting: Family Medicine

## 2019-03-23 ENCOUNTER — Other Ambulatory Visit: Payer: Self-pay

## 2019-03-23 ENCOUNTER — Ambulatory Visit (INDEPENDENT_AMBULATORY_CARE_PROVIDER_SITE_OTHER): Payer: Medicare Other | Admitting: Family Medicine

## 2019-03-23 VITALS — BP 126/82 | HR 69 | Temp 98.1°F | Resp 16 | Ht 70.0 in | Wt 132.0 lb

## 2019-03-23 DIAGNOSIS — K5900 Constipation, unspecified: Secondary | ICD-10-CM | POA: Diagnosis not present

## 2019-03-23 DIAGNOSIS — R634 Abnormal weight loss: Secondary | ICD-10-CM

## 2019-03-23 DIAGNOSIS — R296 Repeated falls: Secondary | ICD-10-CM | POA: Diagnosis not present

## 2019-03-23 DIAGNOSIS — M25512 Pain in left shoulder: Secondary | ICD-10-CM | POA: Insufficient documentation

## 2019-03-23 DIAGNOSIS — R269 Unspecified abnormalities of gait and mobility: Secondary | ICD-10-CM

## 2019-03-23 DIAGNOSIS — Z8673 Personal history of transient ischemic attack (TIA), and cerebral infarction without residual deficits: Secondary | ICD-10-CM

## 2019-03-23 DIAGNOSIS — J4520 Mild intermittent asthma, uncomplicated: Secondary | ICD-10-CM | POA: Diagnosis not present

## 2019-03-23 DIAGNOSIS — R350 Frequency of micturition: Secondary | ICD-10-CM | POA: Insufficient documentation

## 2019-03-23 DIAGNOSIS — I10 Essential (primary) hypertension: Secondary | ICD-10-CM | POA: Diagnosis not present

## 2019-03-23 DIAGNOSIS — G459 Transient cerebral ischemic attack, unspecified: Secondary | ICD-10-CM | POA: Diagnosis not present

## 2019-03-23 DIAGNOSIS — M810 Age-related osteoporosis without current pathological fracture: Secondary | ICD-10-CM | POA: Diagnosis not present

## 2019-03-23 DIAGNOSIS — E440 Moderate protein-calorie malnutrition: Secondary | ICD-10-CM | POA: Diagnosis not present

## 2019-03-23 DIAGNOSIS — R600 Localized edema: Secondary | ICD-10-CM

## 2019-03-23 DIAGNOSIS — S72351D Displaced comminuted fracture of shaft of right femur, subsequent encounter for closed fracture with routine healing: Secondary | ICD-10-CM | POA: Diagnosis not present

## 2019-03-23 LAB — POCT URINALYSIS DIP (MANUAL ENTRY)
Bilirubin, UA: NEGATIVE
Blood, UA: NEGATIVE
Glucose, UA: NEGATIVE mg/dL
Ketones, POC UA: NEGATIVE mg/dL
Leukocytes, UA: NEGATIVE
Nitrite, UA: NEGATIVE
Protein Ur, POC: NEGATIVE mg/dL
Spec Grav, UA: 1.02 (ref 1.010–1.025)
Urobilinogen, UA: 1 E.U./dL
pH, UA: 7 (ref 5.0–8.0)

## 2019-03-23 NOTE — Progress Notes (Signed)
Acute Office Visit  Subjective:    Patient ID: [name], male    DOB: 12-31-1931, 85 y.o.   MRN: [id number]  Chief Complaint  Patient presents with  . Hospitalization Follow-up    pt was seen in the ER on 5/10 for TIA  . Fall    pt had a fall last week           HPI Patient is in today for h/o admission for TIA-now receiving therapy for weakness-s/p fall  With hip fracture in 12/19. Pt now with shoulder pain due to fall 4 days ago. Caregiver was using the bathroom when pt tried to arise alone and fell to the left hitting shoulder. Pt was found down leaning to the left side. EMS called with evaluation-pt declined to go to ER due to [place]. Pt was evaluated by [facility] nurse and noted to have bruising on the shoulder. Pt on plavix due to TIA and Asprin. Pt with h/o constipation x 4 days-taking stool softner. Ongoing problem with pt stating he feels like he needs to go but can not move bowels. No abdominal pain. Pt states he is hungry and has no trouble eating. Caregiver fixes pt 3 meals /day + snacks. Weight loss noted by caregiver with concern.  No difficulty chewing. Care giver lives with pt during the week and daughter stays on weekends. Pt daughter is building are on her house so pt can live there in the near future. Pt currently receiving OT/PT with nurse visits post TIA/fall. Pt takes his own medication  Past Medical History:  Diagnosis Date  . Allergy    seasonal  fall  . Cancer ([place])    Basal cell carcinoma; multiple  . Eczema   . Hyperlipidemia   . Hypertension   . Osteoporosis   . Stroke [facility])     Past Surgical History:  Procedure Laterality Date  . CATARACT EXTRACTION, BILATERAL  06/29/2015  . INTRAMEDULLARY (IM) NAIL INTERTROCHANTERIC Left 03/30/2017   Procedure: INTRAMEDULLARY (IM) NAIL INTERTROCHANTRIC;  Surgeon: [name], MD;  Location: [place];  Service: Orthopedics;  Laterality: Left;  . INTRAMEDULLARY (IM) NAIL INTERTROCHANTERIC Right 11/12/2018   Procedure:  INTRAMEDULLARY (IM) NAIL RIGHT INTERTROCHANTERIC HIP FRACTURE;  Surgeon: [name], MD;  Location: [place];  Service: Orthopedics;  Laterality: Right;  . MOHS SURGERY    . TONSILLECTOMY  1940 maybe    Family History  Problem Relation Age of Onset  . Arthritis Mother   . Heart disease Mother 95  . Hypertension Father     Social History   Socioeconomic History  . Marital status: Married    Spouse name: Not on file  . Number of children: 2  . Years of education: Not on file  . Highest education level: Not on file  Occupational History  . Occupation: retired  Scientific laboratory technician  . Financial resource strain: Not on file  . Food insecurity:    Worry: Not on file    Inability: Not on file  . Transportation needs:    Medical: Not on file    Non-medical: Not on file  Tobacco Use  . Smoking status: Never Smoker  . Smokeless tobacco: Never Used  Substance and Sexual Activity  . Alcohol use: No    Comment: occas  . Drug use: No  . Sexual activity: Not Currently  Lifestyle  . Physical activity:    Days per week: Not on file    Minutes per session: Not on file  .  Stress: Not on file  Relationships  . Social connections:    Talks on phone: Not on file    Gets together: Not on file    Attends religious service: Not on file    Active member of club or organization: Not on file    Attends meetings of clubs or organizations: Not on file    Relationship status: Not on file  . Intimate partner violence:    Fear of current or ex partner: Not on file    Emotionally abused: Not on file    Physically abused: Not on file    Forced sexual activity: Not on file  Other Topics Concern  . Not on file  Social History Narrative   Marital status: divorced; good friend with ex-wife; not dating      Children: 2 children; 4 grandchildren; no gg      Lives:  Alone in house; daughter six blocks away      Employment: college professor; retired in 1997; history; [place]s:   Never      Alcohol: tequila loves but won't buy it.      Exercise:  As much as can; previous competitive biking.      ADLs: quit mowing grass in 2017; no assistant devices; cooks and cleans and pays bills.  No driving; HAS NEVER DRIVEN; has always ridden bike.  Daughter takes patient to grocery store and to appointments.  Daughter drives patient to grocery store.      Advanced Directives:  No living; DNR; do not resuscitate.  HCPOA: [name] Cummings    Outpatient Medications Prior to Visit  Medication Sig Dispense Refill  . albuterol (PROVENTIL HFA;VENTOLIN HFA) 108 (90 Base) MCG/ACT inhaler Inhale 2 puffs into the lungs every 6 (six) hours as needed for wheezing or shortness of breath. 1 Inhaler 0  . alendronate (FOSAMAX) 70 MG tablet Take 1 tablet (70 mg total) by mouth every 7 (seven) days. Take with a full glass of water on an empty stomach. 4 tablet 11  . aspirin EC 81 MG EC tablet Take 1 tablet (81 mg total) by mouth daily. 30 tablet 0  . beclomethasone (QVAR REDIHALER) 40 MCG/ACT inhaler Inhale 1 puff into the lungs 2 (two) times daily. 1 Inhaler 2  . clopidogrel (PLAVIX) 75 MG tablet Take 1 tablet (75 mg total) by mouth daily. 30 tablet 0  . docusate sodium (COLACE) 100 MG capsule Take 1 capsule (100 mg total) by mouth daily. 10 capsule 0  . metoprolol succinate (TOPROL-XL) 50 MG 24 hr tablet Take 1 tablet (50 mg total) by mouth daily. Take with or immediately following a meal. 90 tablet 1  . Misc. Devices (WALKER) MISC Use walker whenever walking, standard walker 1 each 0  . pravastatin (PRAVACHOL) 40 MG tablet Take 1 tablet (40 mg total) by mouth daily. 90 tablet 1  . senna-docusate (SENOKOT-S) 8.6-50 MG tablet Take 1 tablet by mouth at bedtime as needed for moderate constipation.     No facility-administered medications prior to visit.     Allergies  Allergen Reactions  . Seasonal Ic [Cholestatin] Other (See Comments)    Unknown to family member    Review of Systems   Constitutional: Positive for malaise/fatigue and weight loss. Negative for chills and fever.  HENT: Negative for congestion and sore throat.   Respiratory: Positive for wheezing. Negative for cough.   Cardiovascular: Negative for chest pain.  Gastrointestinal: Positive for constipation. Negative for abdominal pain,  blood in stool, heartburn, nausea and vomiting.  Genitourinary: Positive for frequency. Negative for flank pain and hematuria.  Neurological: Positive for speech change and weakness.  Endo/Heme/Allergies: Bruises/bleeds easily.  Psychiatric/Behavioral: Negative for depression. The patient is not nervous/anxious and does not have insomnia.        Objective:    Physical Exam  Constitutional: He appears well-developed and well-nourished. No distress.  Eyes: Pupils are equal, round, and reactive to light.  Cardiovascular: Normal rate, regular rhythm and normal heart sounds.  Pulmonary/Chest: Effort normal and breath sounds normal.  Abdominal: Soft. Bowel sounds are normal. He exhibits no distension and no mass. There is no abdominal tenderness. There is no rebound and no guarding.  Neurological: He is alert.  Skin: He is not diaphoretic. There is erythema.  LE no edema, mild eryth mid tibia  BP 126/82   Pulse 69   Temp 98.1 F (36.7 C) (Oral)   Resp 16   Ht 5\' 10"  (1.778 m)   Wt 132 lb (59.9 kg)   SpO2 93%   BMI 18.94 kg/m  Wt Readings from Last 3 Encounters:  03/23/19 132 lb (59.9 kg)  03/07/19 217 lb 13 oz (98.8 kg)  01/13/19 151 lb (68.5 kg)    Lab Results  Component Value Date   TSH 1.349 06/03/2014   Lab Results  Component Value Date   WBC 7.9 03/09/2019   HGB 15.8 03/09/2019   HCT 45.9 03/09/2019   MCV 87.9 03/09/2019   PLT 194 03/09/2019   Lab Results  Component Value Date   NA 134 (L) 03/09/2019   K 4.3 03/09/2019   CO2 28 03/09/2019   GLUCOSE 91 03/09/2019   BUN 10 03/09/2019   CREATININE 1.03 03/09/2019   BILITOT 0.7 03/08/2019   ALKPHOS  80 03/08/2019   AST 17 03/08/2019   ALT 15 03/08/2019   PROT 6.6 03/08/2019   ALBUMIN 3.4 (L) 03/08/2019   CALCIUM 9.3 03/09/2019   ANIONGAP 8 03/09/2019   Lab Results  Component Value Date   CHOL 153 03/08/2019   Lab Results  Component Value Date   HDL 66 03/08/2019   Lab Results  Component Value Date   LDLCALC 80 03/08/2019   Lab Results  Component Value Date   TRIG 36 03/08/2019   Lab Results  Component Value Date   CHOLHDL 2.3 03/08/2019   Lab Results  Component Value Date   HGBA1C 5.6 03/08/2019       Assessment & Plan:  1. Abnormal weight loss Concern for constipation +weight loss - Ambulatory referral to Orthopedic Surgery - CT Abdomen Pelvis W Contrast; Future GI referral , CT pending, start miralax daily, avoid large amounts of protein-meat/cheese, yogurt, veggies, fruits until stool return to normal-extended time since colonoscopy-d/w pt concern for weight loss and constipation-agreed to CT abd/pelvis 2. Pain in joint of left shoulder Pt with fall /hematoma left shoulder with point tenderness-currently receiving PT for strength-concern for use of left shoulder until ortho evaluation.  plavix /asa- - Ambulatory referral to Orthopedic Surgery  3. Frequent urination - POCT urinalysis dipstick-u/a normal - Urine Culture  4. Constipation, unspecified constipation type miralax-CT abd/pelvis-weight loss 5. History of CVA (cerebrovascular accident) W/u while hospitalized-concern for TIA  6. Recurrent falls PT following for strength  7. Abnormality of gait Using walker-PT-fall risk  8. Leg edema Knee high stockings with extended standing  9. TIA (transient ischemic attack) W/u while hospitalized  Natasia Sanko Hannah Beat, MD

## 2019-03-23 NOTE — Patient Instructions (Addendum)
   [facility] Address: [address], [place], [place] [address] Phone: [phone]  Pt is scheduled for CT June 3rd at 1:15 pm at [facility]. Pt needs to pick up contrast before the exam. NO food or drinks 4 hours before the exam.   1st-1130 2nd-1230   If you have lab work done today you will be contacted with your lab results within the next 2 weeks.  If you have not heard from [place] then please contact us. The fastest way to get your results is to register for My Chart.   IF you received an x-ray today, you will receive an invoice from [facility]. Please contact [facility] at [phone] with questions or concerns regarding your invoice.   IF you received labwork today, you will receive an invoice from [place]. Please contact [facility] at [phone] with questions or concerns regarding your invoice.   Our billing staff will not be able to assist you with questions regarding bills from these companies.  You will be contacted with the lab results as soon as they are available. The fastest way to get your results is to activate your My Chart account. Instructions are located on the last page of this paperwork. If you have not heard from [place] regarding the results in 2 weeks, please contact this office.

## 2019-03-24 ENCOUNTER — Ambulatory Visit: Payer: Self-pay

## 2019-03-24 DIAGNOSIS — I10 Essential (primary) hypertension: Secondary | ICD-10-CM | POA: Diagnosis not present

## 2019-03-24 DIAGNOSIS — E440 Moderate protein-calorie malnutrition: Secondary | ICD-10-CM | POA: Diagnosis not present

## 2019-03-24 DIAGNOSIS — J4520 Mild intermittent asthma, uncomplicated: Secondary | ICD-10-CM | POA: Diagnosis not present

## 2019-03-24 DIAGNOSIS — S72351D Displaced comminuted fracture of shaft of right femur, subsequent encounter for closed fracture with routine healing: Secondary | ICD-10-CM | POA: Diagnosis not present

## 2019-03-24 DIAGNOSIS — M810 Age-related osteoporosis without current pathological fracture: Secondary | ICD-10-CM | POA: Diagnosis not present

## 2019-03-24 DIAGNOSIS — Z8673 Personal history of transient ischemic attack (TIA), and cerebral infarction without residual deficits: Secondary | ICD-10-CM | POA: Diagnosis not present

## 2019-03-24 LAB — URINE CULTURE

## 2019-03-24 NOTE — Telephone Encounter (Signed)
182/100  Left.   Arm168/90 right arm 170/92 left arm  60  Heart rate. No headache no chest Pain.  Weight 148.lbs  [facility] call from [facility] reports the preceding values.

## 2019-03-25 ENCOUNTER — Telehealth: Payer: Self-pay | Admitting: Family Medicine

## 2019-03-25 MED ORDER — BLOOD PRESSURE KIT: PACK | 0 refills | Status: AC

## 2019-03-25 NOTE — Telephone Encounter (Signed)
Patient weight was wrong on 5/26 at 132lb encounter causing family worry. The correct weight of pt is 148lbs taken today. Weight is taking daily per [name] which is the care giver. Daughter is requesting cancel of ct scan, no longer needed since error recognized. Pt has appt  6/4. Pt is having frequent urine, however urine testing was normal

## 2019-03-25 NOTE — Telephone Encounter (Addendum)
Spoke with RN [place] BP readings yesterday According to care giver medication had been taken that day RN states that he was otherwise fine  BP Readings from Last 3 Encounters:  03/23/19 126/82  03/09/19 120/72  01/13/19 120/80   Also reports weight at home 148 lbs Which is his baseline They do not want to proceed with CT scan  Discussed discrepancy between our BP readings and home  Ordered BP cuff, rx faxed to [facility]  Check BP twice a day Re-eval in 1 week.

## 2019-03-25 NOTE — Telephone Encounter (Signed)
Copied from [place] [id number]. Topic: Quick Communication - See Telephone Encounter >> Mar 25, 2019  3:52 PM Rayann Heman wrote: CRM for notification. See Telephone encounter for: 03/25/19.pt daughter amy called and stated that she would like a call back from the nurse regarding patient losing to much weight. Pt daughter states that the in office weight is incorrect. Please advise

## 2019-03-25 NOTE — Addendum Note (Signed)
Addended by: Rutherford Guys on: 03/25/2019 03:41 PM   Modules accepted: Orders

## 2019-03-26 ENCOUNTER — Telehealth: Payer: Self-pay

## 2019-03-26 DIAGNOSIS — J4520 Mild intermittent asthma, uncomplicated: Secondary | ICD-10-CM | POA: Diagnosis not present

## 2019-03-26 DIAGNOSIS — Z8673 Personal history of transient ischemic attack (TIA), and cerebral infarction without residual deficits: Secondary | ICD-10-CM | POA: Diagnosis not present

## 2019-03-26 DIAGNOSIS — S72351D Displaced comminuted fracture of shaft of right femur, subsequent encounter for closed fracture with routine healing: Secondary | ICD-10-CM | POA: Diagnosis not present

## 2019-03-26 DIAGNOSIS — E440 Moderate protein-calorie malnutrition: Secondary | ICD-10-CM | POA: Diagnosis not present

## 2019-03-26 DIAGNOSIS — M810 Age-related osteoporosis without current pathological fracture: Secondary | ICD-10-CM | POA: Diagnosis not present

## 2019-03-26 DIAGNOSIS — I10 Essential (primary) hypertension: Secondary | ICD-10-CM | POA: Diagnosis not present

## 2019-03-26 NOTE — Telephone Encounter (Signed)
rx faxed to [phone] for bp kit

## 2019-03-29 DIAGNOSIS — I10 Essential (primary) hypertension: Secondary | ICD-10-CM | POA: Diagnosis not present

## 2019-03-29 DIAGNOSIS — Z8673 Personal history of transient ischemic attack (TIA), and cerebral infarction without residual deficits: Secondary | ICD-10-CM | POA: Diagnosis not present

## 2019-03-29 DIAGNOSIS — E440 Moderate protein-calorie malnutrition: Secondary | ICD-10-CM | POA: Diagnosis not present

## 2019-03-29 DIAGNOSIS — M810 Age-related osteoporosis without current pathological fracture: Secondary | ICD-10-CM | POA: Diagnosis not present

## 2019-03-29 DIAGNOSIS — S72351D Displaced comminuted fracture of shaft of right femur, subsequent encounter for closed fracture with routine healing: Secondary | ICD-10-CM | POA: Diagnosis not present

## 2019-03-29 DIAGNOSIS — J4520 Mild intermittent asthma, uncomplicated: Secondary | ICD-10-CM | POA: Diagnosis not present

## 2019-03-29 NOTE — Progress Notes (Signed)
Spoke with care giver regarding this pt. All is well

## 2019-03-30 ENCOUNTER — Ambulatory Visit (INDEPENDENT_AMBULATORY_CARE_PROVIDER_SITE_OTHER): Payer: Medicare Other

## 2019-03-30 ENCOUNTER — Ambulatory Visit (INDEPENDENT_AMBULATORY_CARE_PROVIDER_SITE_OTHER): Payer: Medicare Other | Admitting: Orthopaedic Surgery

## 2019-03-30 ENCOUNTER — Other Ambulatory Visit: Payer: Self-pay

## 2019-03-30 ENCOUNTER — Encounter: Payer: Self-pay | Admitting: Orthopaedic Surgery

## 2019-03-30 DIAGNOSIS — M25551 Pain in right hip: Secondary | ICD-10-CM

## 2019-03-30 DIAGNOSIS — M25512 Pain in left shoulder: Secondary | ICD-10-CM | POA: Diagnosis not present

## 2019-03-30 NOTE — Progress Notes (Signed)
Office Visit Note   Patient: [name]           Date of Birth: 09-03-1932           MRN: [id number] Visit Date: 03/30/2019              Requested by: Maryruth Hancock, MD [address], [place] [address] PCP: Rutherford Guys, MD   Assessment & Plan: Visit Diagnoses:  1. Acute pain of left shoulder     Plan: Impression is status post right hip intramedullary nail and left nondisplaced distal third clavicle fracture.  In regards to his hip, he will continue with strengthening exercises.  In regards to the left clavicle, believe this is very stable.  He will continue with activity as tolerated.  He will follow-up with [place] in 6 weeks time for repeat evaluation and x-rays.  Follow-Up Instructions: Return in about 6 weeks (around 05/11/2019).   Orders:  Orders Placed This Encounter  Procedures  . XR Shoulder Left  . XR HIP UNILAT W OR W/O PELVIS 2-3 VIEWS RIGHT   No orders of the defined types were placed in this encounter.     Procedures: No procedures performed   Clinical Data: No additional findings.   Subjective: Chief Complaint  Patient presents with  . Left Shoulder - Pain    HPI patient is a pleasant 85 year old gentleman who presents our clinic today 5 months status post right hip intramedullary nail from an intertrochanteric hip fracture, date of surgery 11/12/2018.  He has been doing well.  He has been ambulating with a walker.  No complaints of pain.  His main issue today is his left shoulder pain which occurred approximately 5 days ago after sustaining mechanical fall landing on the left shoulder.  He has had pain to the entire aspect of the left shoulder as well as the distal clavicle since, although he does note the pain has improved.  He notes that he has not had to take anything for pain.  No weakness to the left upper extremity.  Review of Systems as detailed in HPI.  All others reviewed and are negative.   Objective: Vital Signs: There were no vitals  taken for this visit.  Physical Exam well-developed well-nourished gentleman in no acute distress.  Alert and oriented x3.  Ortho Exam examination of his right hip reveals negative logroll.  He is able to actively flex and extend his hip.  Left shoulder exam shows marked ecchymosis throughout the shoulder region.  He has moderate tenderness to the distal clavicle  .full active range of motion, although he does have pain with internal rotation.  He is neurovascularly intact distally.  Specialty Comments:  No specialty comments available.  Imaging: Xr Hip Unilat W Or W/o Pelvis 2-3 Views Right  Result Date: 03/30/2019 X-rays demonstrate stable fixation of the nail with continued evidence of callus formation.  Xr Shoulder Left  Result Date: 03/30/2019 X-rays demonstrate a questionable nondisplaced fracture through the distal third clavicle    PMFS History: Patient Active Problem List   Diagnosis Date Noted  . Abnormal weight loss 03/23/2019  . Pain in joint of left shoulder 03/23/2019  . Frequent urination 03/23/2019  . Constipation 03/23/2019  . TIA (transient ischemic attack) 03/08/2019  . Hyponatremia 03/08/2019  . Impaired mobility 01/13/2019  . Seasonal allergies 01/13/2019  . Hiatal hernia 01/13/2019  . Pain in right hip 12/24/2018  . S/P right hip fracture 11/11/2018  . Recurrent falls 05/12/2018  .  History of CVA (cerebrovascular accident) 01/16/2018  . Nocturnal enuresis   . Fall   . Full incontinence of feces   . Essential hypertension   . Hypoalbuminemia due to protein-calorie malnutrition ([place])   . Abnormality of gait   . Leg edema   . Hip fracture ([place]) 03/30/2017  . Leukocytosis 03/29/2017  . Mild intermittent asthma with acute exacerbation 01/29/2017  . Chronic renal insufficiency 09/22/2015  . Tachycardia 09/22/2015  . PVC (premature ventricular contraction) 09/22/2015  . Osteoporosis 06/03/2014  . Basal cell carcinoma of face 06/03/2014  . Benign  prostatic hyperplasia 05/27/2013  . Eczema 04/23/2012  . Dyslipidemia 04/23/2012   Past Medical History:  Diagnosis Date  . Allergy    seasonal  fall  . Cancer ([place])    Basal cell carcinoma; multiple  . Eczema   . Hyperlipidemia   . Hypertension   . Osteoporosis   . Stroke [facility])     Family History  Problem Relation Age of Onset  . Arthritis Mother   . Heart disease Mother 30  . Hypertension Father     Past Surgical History:  Procedure Laterality Date  . CATARACT EXTRACTION, BILATERAL  06/29/2015  . INTRAMEDULLARY (IM) NAIL INTERTROCHANTERIC Left 03/30/2017   Procedure: INTRAMEDULLARY (IM) NAIL INTERTROCHANTRIC;  Surgeon: [name], MD;  Location: [place];  Service: Orthopedics;  Laterality: Left;  . INTRAMEDULLARY (IM) NAIL INTERTROCHANTERIC Right 11/12/2018   Procedure: INTRAMEDULLARY (IM) NAIL RIGHT INTERTROCHANTERIC HIP FRACTURE;  Surgeon: [name], MD;  Location: [place];  Service: Orthopedics;  Laterality: Right;  . MOHS SURGERY    . TONSILLECTOMY  1940 maybe   Social History   Occupational History  . Occupation: retired  Tobacco Use  . Smoking status: Never Smoker  . Smokeless tobacco: Never Used  Substance and Sexual Activity  . Alcohol use: No    Comment: occas  . Drug use: No  . Sexual activity: Not Currently

## 2019-03-31 ENCOUNTER — Ambulatory Visit (INDEPENDENT_AMBULATORY_CARE_PROVIDER_SITE_OTHER): Payer: Medicare Other | Admitting: Podiatry

## 2019-03-31 ENCOUNTER — Ambulatory Visit: Payer: Medicare Other | Admitting: Gastroenterology

## 2019-03-31 ENCOUNTER — Encounter: Payer: Self-pay | Admitting: Podiatry

## 2019-03-31 ENCOUNTER — Other Ambulatory Visit: Payer: Self-pay

## 2019-03-31 ENCOUNTER — Telehealth: Payer: Self-pay | Admitting: Family Medicine

## 2019-03-31 ENCOUNTER — Ambulatory Visit (HOSPITAL_COMMUNITY): Admission: RE | Admit: 2019-03-31 | Payer: Medicare Other | Source: Ambulatory Visit

## 2019-03-31 VITALS — Temp 98.1°F

## 2019-03-31 DIAGNOSIS — D689 Coagulation defect, unspecified: Secondary | ICD-10-CM

## 2019-03-31 DIAGNOSIS — B351 Tinea unguium: Secondary | ICD-10-CM

## 2019-03-31 DIAGNOSIS — M79676 Pain in unspecified toe(s): Secondary | ICD-10-CM | POA: Diagnosis not present

## 2019-03-31 NOTE — Telephone Encounter (Signed)
Copied from [place] [id number]. Topic: Quick Communication - See Telephone Encounter >> Mar 31, 2019 10:34 AM Reyne Dumas L wrote: CRM for notification. See Telephone encounter for: 03/31/19.  Vanda from [place] called to inform office that pt cancelled his CT for today

## 2019-03-31 NOTE — Progress Notes (Signed)
Complaint:  Visit Type: Patient returns to my office for continued preventative foot care services. Complaint: Patient states" my nails have grown long and thick and become painful to walk and wear shoes" Patient has been diagnosed with DM with no foot complications. The patient presents for preventative foot care services. No changes to ROS.  Patient has not been seen in iver 5 months.  Podiatric Exam: Vascular: dorsalis pedis and posterior tibial pulses are palpable bilateral. Capillary return is immediate. Temperature gradient is WNL. Skin turgor WNL  Sensorium: Normal Semmes Weinstein monofilament test. Normal tactile sensation bilaterally. Nail Exam: Pt has thick disfigured discolored nails with subungual debris noted bilateral entire nail hallux through fifth toenails Ulcer Exam: There is no evidence of ulcer or pre-ulcerative changes or infection. Orthopedic Exam: Muscle tone and strength are WNL. No limitations in general ROM. No crepitus or effusions noted. Foot type and digits show no abnormalities. Bony prominences are unremarkable. Skin: No Porokeratosis. No infection or ulcers.   Diagnosis:  Onychomycosis, , Pain in right toe, pain in left toes  Treatment & Plan Procedures and Treatment: Consent by patient was obtained for treatment procedures.   Debridement of mycotic and hypertrophic toenails, 1 through 5 bilateral and clearing of subungual debris. No ulceration, no infection noted.  Return Visit-Office Procedure: Patient instructed to return to the office for a follow up visit 10 weeks  for continued evaluation and treatment.    Gardiner Barefoot DPM

## 2019-04-01 ENCOUNTER — Encounter: Payer: Self-pay | Admitting: Family Medicine

## 2019-04-01 ENCOUNTER — Ambulatory Visit (INDEPENDENT_AMBULATORY_CARE_PROVIDER_SITE_OTHER): Payer: Medicare Other | Admitting: Family Medicine

## 2019-04-01 ENCOUNTER — Telehealth: Payer: Self-pay | Admitting: Orthopaedic Surgery

## 2019-04-01 VITALS — BP 140/100 | HR 93 | Temp 98.1°F | Ht 70.5 in | Wt 148.0 lb

## 2019-04-01 DIAGNOSIS — R296 Repeated falls: Secondary | ICD-10-CM | POA: Diagnosis not present

## 2019-04-01 DIAGNOSIS — Z8673 Personal history of transient ischemic attack (TIA), and cerebral infarction without residual deficits: Secondary | ICD-10-CM | POA: Diagnosis not present

## 2019-04-01 DIAGNOSIS — Z7409 Other reduced mobility: Secondary | ICD-10-CM | POA: Diagnosis not present

## 2019-04-01 DIAGNOSIS — I10 Essential (primary) hypertension: Secondary | ICD-10-CM | POA: Diagnosis not present

## 2019-04-01 DIAGNOSIS — R350 Frequency of micturition: Secondary | ICD-10-CM | POA: Diagnosis not present

## 2019-04-01 NOTE — Patient Instructions (Signed)
° ° ° °  If you have lab work done today you will be contacted with your lab results within the next 2 weeks.  If you have not heard from us then please contact us. The fastest way to get your results is to register for My Chart. ° ° °IF you received an x-ray today, you will receive an invoice from [facility]. Please contact [facility] at [phone] with questions or concerns regarding your invoice.  ° °IF you received labwork today, you will receive an invoice from [facility]. Please contact [facility] at [phone] with questions or concerns regarding your invoice.  ° °Our billing staff will not be able to assist you with questions regarding bills from these companies. ° °You will be contacted with the lab results as soon as they are available. The fastest way to get your results is to activate your My Chart account. Instructions are located on the last page of this paperwork. If you have not heard from us regarding the results in 2 weeks, please contact this office. °  ° ° ° °

## 2019-04-01 NOTE — Progress Notes (Signed)
6/4/20205:36 PM  [name] 1932-04-06, 85 y.o., male [id number]  Chief Complaint  Patient presents with  . Hospitalization Follow-up    from the fall     HPI:   Patient is a 85 y.o. male with past medical history significant for HTN, asthma, BPH, CKD, HLP, CVA, osteoporosis who presents today for hosp followup  Admitted in [place] may 1012th Dx TIA - dual therapy for 3 weeks, then plavix alone Had OV with DR Holly Bodily on May 26th for left shoulder pain, referred to ortho miralax for constipation Concern for weight loss but error, at home weight has been stable. Declined CT, see [facility] HH services present Saw ortho 2 days ago, stable, cont with PT  Patient reports he is doing well HH is going out Reports PT is doing ok, left shoulder is getting better Uses walker Home BP readings by [facility] nurse - 120/80s Appetite is good Constipation is better with miralax No falls since recent hosp Having significant urinary frequency and urgency, reports weak stream , nocturia x 2, having incontinence Normal UA and culture They have stopped ASA as instructed Has EMS alert system  Here with caregiver   Fall Risk  04/01/2019 03/23/2019 02/03/2019 01/13/2019 09/29/2018  Falls in the past year? 1 1 0 0 1  Comment - - - - -  Number falls in past yr: 0 0 - - 1  Injury with Fall? 1 0 0 1 0  Comment - - - broke his right hip -  Risk Factor Category  - - - - -  Risk for fall due to : - - - History of fall(s);Impaired balance/gait -  Follow up Falls evaluation completed - - Falls evaluation completed -     Depression screen [facility] 2/9 04/01/2019 02/03/2019 01/13/2019  Decreased Interest 0 0 0  Down, Depressed, Hopeless 0 0 0  PHQ - 2 Score 0 0 0  Altered sleeping - - -  Tired, decreased energy - - -  Change in appetite - - -  Feeling bad or failure about yourself  - - -  Trouble concentrating - - -  Moving slowly or fidgety/restless - - -  Suicidal thoughts - - -  PHQ-9 Score - - -  Difficult doing  work/chores - - -    Allergies  Allergen Reactions  . Seasonal Ic [Cholestatin] Other (See Comments)    Unknown to family member    Prior to Admission medications   Medication Sig Start Date End Date Taking? Authorizing Provider  albuterol (PROVENTIL HFA;VENTOLIN HFA) 108 (90 Base) MCG/ACT inhaler Inhale 2 puffs into the lungs every 6 (six) hours as needed for wheezing or shortness of breath. 09/29/18  Yes Forrest Moron, MD  alendronate (FOSAMAX) 70 MG tablet Take 1 tablet (70 mg total) by mouth every 7 (seven) days. Take with a full glass of water on an empty stomach. 07/27/18  Yes Rutherford Guys, MD  aspirin EC 81 MG EC tablet Take 1 tablet (81 mg total) by mouth daily. 01/21/18  Yes Velvet Bathe, MD  beclomethasone (QVAR REDIHALER) 40 MCG/ACT inhaler Inhale 1 puff into the lungs 2 (two) times daily. 02/05/19  Yes Rutherford Guys, MD  Blood Pressure KIT Please check BP twice a day 03/25/19  Yes Rutherford Guys, MD  clopidogrel (PLAVIX) 75 MG tablet Take 1 tablet (75 mg total) by mouth daily. 03/09/19  Yes Eulogio Bear U, DO  docusate sodium (COLACE) 100 MG capsule Take 1 capsule (100 mg  total) by mouth daily. 03/09/19  Yes Eulogio Bear U, DO  metoprolol succinate (TOPROL-XL) 50 MG 24 hr tablet Take 1 tablet (50 mg total) by mouth daily. Take with or immediately following a meal. 07/27/18  Yes Rutherford Guys, MD  Misc. Devices [facility]) MISC Use walker whenever walking, standard walker 07/27/18  Yes Rutherford Guys, MD  pravastatin (PRAVACHOL) 40 MG tablet Take 1 tablet (40 mg total) by mouth daily. 07/27/18  Yes Rutherford Guys, MD  senna-docusate (SENOKOT-S) 8.6-50 MG tablet Take 1 tablet by mouth at bedtime as needed for moderate constipation. 03/09/19  Yes Geradine Girt, DO    Past Medical History:  Diagnosis Date  . Allergy    seasonal  fall  . Cancer ([place])    Basal cell carcinoma; multiple  . Eczema   . Hyperlipidemia   . Hypertension   . Osteoporosis   . Stroke [facility])      Past Surgical History:  Procedure Laterality Date  . CATARACT EXTRACTION, BILATERAL  06/29/2015  . INTRAMEDULLARY (IM) NAIL INTERTROCHANTERIC Left 03/30/2017   Procedure: INTRAMEDULLARY (IM) NAIL INTERTROCHANTRIC;  Surgeon: [name], MD;  Location: [place];  Service: Orthopedics;  Laterality: Left;  . INTRAMEDULLARY (IM) NAIL INTERTROCHANTERIC Right 11/12/2018   Procedure: INTRAMEDULLARY (IM) NAIL RIGHT INTERTROCHANTERIC HIP FRACTURE;  Surgeon: [name], MD;  Location: [place];  Service: Orthopedics;  Laterality: Right;  . MOHS SURGERY    . TONSILLECTOMY  1940 maybe    Social History   Tobacco Use  . Smoking status: Never Smoker  . Smokeless tobacco: Never Used  Substance Use Topics  . Alcohol use: No    Comment: occas    Family History  Problem Relation Age of Onset  . Arthritis Mother   . Heart disease Mother 73  . Hypertension Father     ROS Per hpi  OBJECTIVE:  Today's Vitals   04/01/19 1610  BP: (!) 146/70  Pulse: 93  Temp: 98.1 F (36.7 C)  TempSrc: Oral  SpO2: 94%  Weight: 148 lb (67.1 kg)  Height: 5' 10.5" (1.791 m)   Body mass index is 20.94 kg/m.  BP Readings from Last 3 Encounters:  04/01/19 (!) 146/70  03/23/19 126/82  03/09/19 120/72    Physical Exam Vitals signs and nursing note reviewed.  Constitutional:      Appearance: He is well-developed.  HENT:     Head: Normocephalic and atraumatic.  Eyes:     Conjunctiva/sclera: Conjunctivae normal.     Pupils: Pupils are equal, round, and reactive to light.  Neck:     Musculoskeletal: Neck supple.  Cardiovascular:     Rate and Rhythm: Normal rate and regular rhythm.     Heart sounds: No murmur. No friction rub. No gallop.   Pulmonary:     Effort: Pulmonary effort is normal.     Breath sounds: Normal breath sounds. No wheezing or rales.  Musculoskeletal:     Right lower leg: No edema.     Left lower leg: No edema.  Skin:    General: Skin is warm and dry.  Neurological:      Mental Status: He is alert and oriented to person, place, and time. Mental status is at baseline.     Gait: Gait abnormal.  Psychiatric:        Mood and Affect: Mood normal.     ASSESSMENT and PLAN  1. Essential hypertension Today above goal but at home 120/80s. Given risk of fall will  defer to home readings. Continue with home BP monitoring.  2. History of CVA (cerebrovascular accident) Recent workup in hosp. Transitioned to plavix only per neuro recommendations. Cont with mgt of RF.  3. Frequent urination Recent eval for UTI neg. Discussed most likely BPH, discussed r/se/b of medication for BPH, patient declines given risk for orthostatic hypotension  4. Recurrent falls 5. Impaired mobility Uses walker. Has caregiver. Cont home health PT for strengthening and balance training.   Return in about 3 months (around 07/02/2019).    Rutherford Guys, MD Primary [facility] at [facility] [place],  [id number] Ph.  [phone] Fax [phone]

## 2019-04-01 NOTE — Telephone Encounter (Signed)
Betsy from [facility] called requesting verbal orders for physical therapy 0 times/week for 1 week, 2 times/week for 2 weeks, 1 time/week for 1 week.  Please call [phone]

## 2019-04-01 NOTE — Telephone Encounter (Signed)
Called to approve orders.  No answer LMOM.

## 2019-04-02 DIAGNOSIS — M810 Age-related osteoporosis without current pathological fracture: Secondary | ICD-10-CM | POA: Diagnosis not present

## 2019-04-02 DIAGNOSIS — J4520 Mild intermittent asthma, uncomplicated: Secondary | ICD-10-CM | POA: Diagnosis not present

## 2019-04-02 DIAGNOSIS — E440 Moderate protein-calorie malnutrition: Secondary | ICD-10-CM | POA: Diagnosis not present

## 2019-04-02 DIAGNOSIS — W19XXXD Unspecified fall, subsequent encounter: Secondary | ICD-10-CM | POA: Diagnosis not present

## 2019-04-02 DIAGNOSIS — S72351D Displaced comminuted fracture of shaft of right femur, subsequent encounter for closed fracture with routine healing: Secondary | ICD-10-CM | POA: Diagnosis not present

## 2019-04-02 DIAGNOSIS — Z8673 Personal history of transient ischemic attack (TIA), and cerebral infarction without residual deficits: Secondary | ICD-10-CM | POA: Diagnosis not present

## 2019-04-02 DIAGNOSIS — I10 Essential (primary) hypertension: Secondary | ICD-10-CM | POA: Diagnosis not present

## 2019-04-12 DIAGNOSIS — Z8673 Personal history of transient ischemic attack (TIA), and cerebral infarction without residual deficits: Secondary | ICD-10-CM | POA: Diagnosis not present

## 2019-04-12 DIAGNOSIS — E440 Moderate protein-calorie malnutrition: Secondary | ICD-10-CM | POA: Diagnosis not present

## 2019-04-12 DIAGNOSIS — I10 Essential (primary) hypertension: Secondary | ICD-10-CM | POA: Diagnosis not present

## 2019-04-12 DIAGNOSIS — S72351D Displaced comminuted fracture of shaft of right femur, subsequent encounter for closed fracture with routine healing: Secondary | ICD-10-CM | POA: Diagnosis not present

## 2019-04-12 DIAGNOSIS — M810 Age-related osteoporosis without current pathological fracture: Secondary | ICD-10-CM | POA: Diagnosis not present

## 2019-04-12 DIAGNOSIS — J4520 Mild intermittent asthma, uncomplicated: Secondary | ICD-10-CM | POA: Diagnosis not present

## 2019-04-14 DIAGNOSIS — I10 Essential (primary) hypertension: Secondary | ICD-10-CM | POA: Diagnosis not present

## 2019-04-14 DIAGNOSIS — J4520 Mild intermittent asthma, uncomplicated: Secondary | ICD-10-CM | POA: Diagnosis not present

## 2019-04-14 DIAGNOSIS — Z8673 Personal history of transient ischemic attack (TIA), and cerebral infarction without residual deficits: Secondary | ICD-10-CM | POA: Diagnosis not present

## 2019-04-14 DIAGNOSIS — S72351D Displaced comminuted fracture of shaft of right femur, subsequent encounter for closed fracture with routine healing: Secondary | ICD-10-CM | POA: Diagnosis not present

## 2019-04-14 DIAGNOSIS — E440 Moderate protein-calorie malnutrition: Secondary | ICD-10-CM | POA: Diagnosis not present

## 2019-04-14 DIAGNOSIS — M810 Age-related osteoporosis without current pathological fracture: Secondary | ICD-10-CM | POA: Diagnosis not present

## 2019-04-15 ENCOUNTER — Telehealth: Payer: Self-pay | Admitting: Family Medicine

## 2019-04-15 NOTE — Telephone Encounter (Signed)
Called pt gave 1st available appt for in office BP issue 04/30/2019 FR

## 2019-04-15 NOTE — Telephone Encounter (Signed)
Patient needs to be evaluated in clinic. thanks

## 2019-04-15 NOTE — Telephone Encounter (Signed)
Please schedule an OV with [place]

## 2019-04-15 NOTE — Telephone Encounter (Signed)
Called and gave verbal orders for skilled nursing, she also had some concerns about pt: Mo with [facility] home [facility] called to say that patient BP systolic was 627 and that  both lower legs cold to touch could not palpate a pedal pulse.  Please advise

## 2019-04-15 NOTE — Telephone Encounter (Signed)
Caller/Agency: Mo / [facility] [place] Number: [phone] ok to LM Requesting OT/PT/Skilled Nursing/Social Work/Speech Therapy: Skilled Nursing Frequency: 1 x week for 2 weeks  Mo with [facility] home [facility] called to say that patient BP systolic was 350 and that  both lower legs cold to touch could not palpate a pedal pulse.

## 2019-04-19 DIAGNOSIS — Z8673 Personal history of transient ischemic attack (TIA), and cerebral infarction without residual deficits: Secondary | ICD-10-CM | POA: Diagnosis not present

## 2019-04-19 DIAGNOSIS — M810 Age-related osteoporosis without current pathological fracture: Secondary | ICD-10-CM | POA: Diagnosis not present

## 2019-04-19 DIAGNOSIS — E440 Moderate protein-calorie malnutrition: Secondary | ICD-10-CM | POA: Diagnosis not present

## 2019-04-19 DIAGNOSIS — I10 Essential (primary) hypertension: Secondary | ICD-10-CM | POA: Diagnosis not present

## 2019-04-19 DIAGNOSIS — S72351D Displaced comminuted fracture of shaft of right femur, subsequent encounter for closed fracture with routine healing: Secondary | ICD-10-CM | POA: Diagnosis not present

## 2019-04-19 DIAGNOSIS — J4520 Mild intermittent asthma, uncomplicated: Secondary | ICD-10-CM | POA: Diagnosis not present

## 2019-04-20 DIAGNOSIS — J4520 Mild intermittent asthma, uncomplicated: Secondary | ICD-10-CM | POA: Diagnosis not present

## 2019-04-20 DIAGNOSIS — E440 Moderate protein-calorie malnutrition: Secondary | ICD-10-CM | POA: Diagnosis not present

## 2019-04-20 DIAGNOSIS — S72351D Displaced comminuted fracture of shaft of right femur, subsequent encounter for closed fracture with routine healing: Secondary | ICD-10-CM | POA: Diagnosis not present

## 2019-04-20 DIAGNOSIS — Z8673 Personal history of transient ischemic attack (TIA), and cerebral infarction without residual deficits: Secondary | ICD-10-CM | POA: Diagnosis not present

## 2019-04-20 DIAGNOSIS — M810 Age-related osteoporosis without current pathological fracture: Secondary | ICD-10-CM | POA: Diagnosis not present

## 2019-04-20 DIAGNOSIS — I10 Essential (primary) hypertension: Secondary | ICD-10-CM | POA: Diagnosis not present

## 2019-04-20 NOTE — Telephone Encounter (Addendum)
[name] w/ [facility] would like to make sure that you can also address the issue she is unable to feel the petal pulse in either feet.  Pt has appt 04/30/2019

## 2019-04-21 ENCOUNTER — Telehealth: Payer: Self-pay | Admitting: Family Medicine

## 2019-04-21 DIAGNOSIS — M810 Age-related osteoporosis without current pathological fracture: Secondary | ICD-10-CM | POA: Diagnosis not present

## 2019-04-21 DIAGNOSIS — E440 Moderate protein-calorie malnutrition: Secondary | ICD-10-CM | POA: Diagnosis not present

## 2019-04-21 DIAGNOSIS — I10 Essential (primary) hypertension: Secondary | ICD-10-CM | POA: Diagnosis not present

## 2019-04-21 DIAGNOSIS — S72351D Displaced comminuted fracture of shaft of right femur, subsequent encounter for closed fracture with routine healing: Secondary | ICD-10-CM | POA: Diagnosis not present

## 2019-04-21 DIAGNOSIS — J4520 Mild intermittent asthma, uncomplicated: Secondary | ICD-10-CM | POA: Diagnosis not present

## 2019-04-21 DIAGNOSIS — Z8673 Personal history of transient ischemic attack (TIA), and cerebral infarction without residual deficits: Secondary | ICD-10-CM | POA: Diagnosis not present

## 2019-04-21 NOTE — Telephone Encounter (Signed)
Betsy with [facility] call to say pt fell and has no pain or injury    [phone]

## 2019-04-27 ENCOUNTER — Ambulatory Visit (HOSPITAL_COMMUNITY)
Admission: RE | Admit: 2019-04-27 | Discharge: 2019-04-27 | Disposition: A | Discharge status: Home / Self Care | Payer: Medicare Other | Source: Ambulatory Visit | Attending: Family Medicine | Admitting: Family Medicine

## 2019-04-27 ENCOUNTER — Other Ambulatory Visit: Payer: Self-pay

## 2019-04-27 ENCOUNTER — Ambulatory Visit (INDEPENDENT_AMBULATORY_CARE_PROVIDER_SITE_OTHER): Payer: Medicare Other | Admitting: Family Medicine

## 2019-04-27 ENCOUNTER — Encounter: Payer: Self-pay | Admitting: Family Medicine

## 2019-04-27 VITALS — BP 146/75 | HR 95 | Temp 98.5°F | Resp 17 | Ht 70.5 in | Wt 135.6 lb

## 2019-04-27 DIAGNOSIS — R413 Other amnesia: Secondary | ICD-10-CM | POA: Diagnosis not present

## 2019-04-27 DIAGNOSIS — I4891 Unspecified atrial fibrillation: Secondary | ICD-10-CM | POA: Diagnosis not present

## 2019-04-27 DIAGNOSIS — R410 Disorientation, unspecified: Secondary | ICD-10-CM | POA: Diagnosis not present

## 2019-04-27 DIAGNOSIS — F0391 Unspecified dementia with behavioral disturbance: Secondary | ICD-10-CM | POA: Diagnosis not present

## 2019-04-27 DIAGNOSIS — I6930 Unspecified sequelae of cerebral infarction: Secondary | ICD-10-CM

## 2019-04-27 DIAGNOSIS — R296 Repeated falls: Secondary | ICD-10-CM

## 2019-04-27 DIAGNOSIS — I129 Hypertensive chronic kidney disease with stage 1 through stage 4 chronic kidney disease, or unspecified chronic kidney disease: Secondary | ICD-10-CM | POA: Diagnosis not present

## 2019-04-27 DIAGNOSIS — Z1159 Encounter for screening for other viral diseases: Secondary | ICD-10-CM | POA: Diagnosis not present

## 2019-04-27 DIAGNOSIS — R441 Visual hallucinations: Secondary | ICD-10-CM | POA: Diagnosis not present

## 2019-04-27 DIAGNOSIS — Z20828 Contact with and (suspected) exposure to other viral communicable diseases: Secondary | ICD-10-CM | POA: Diagnosis not present

## 2019-04-27 DIAGNOSIS — R4182 Altered mental status, unspecified: Secondary | ICD-10-CM | POA: Diagnosis not present

## 2019-04-27 DIAGNOSIS — R51 Headache: Secondary | ICD-10-CM | POA: Diagnosis not present

## 2019-04-27 DIAGNOSIS — E871 Hypo-osmolality and hyponatremia: Secondary | ICD-10-CM | POA: Diagnosis not present

## 2019-04-27 DIAGNOSIS — Z66 Do not resuscitate: Secondary | ICD-10-CM | POA: Diagnosis not present

## 2019-04-27 DIAGNOSIS — F05 Delirium due to known physiological condition: Secondary | ICD-10-CM | POA: Diagnosis not present

## 2019-04-27 LAB — POCT CBC
Granulocyte percent: 74.3 %G (ref 37–80)
HCT, POC: 44.2 % — AB (ref 29–41)
Hemoglobin: 15.3 g/dL — AB (ref 11–14.6)
Lymph, poc: 1.7 (ref 0.6–3.4)
MCH, POC: 32.2 pg — AB (ref 27–31.2)
MCHC: 34.6 g/dL (ref 31.8–35.4)
MCV: 92.9 fL (ref 76–111)
MID (cbc): 0.7 (ref 0–0.9)
MPV: 7.1 fL (ref 0–99.8)
POC Granulocyte: 6.8 (ref 2–6.9)
POC LYMPH PERCENT: 18.4 %L (ref 10–50)
POC MID %: 7.3 %M (ref 0–12)
Platelet Count, POC: 226 10*3/uL (ref 142–424)
RBC: 4.76 M/uL (ref 4.69–6.13)
RDW, POC: 14 %
WBC: 9.2 10*3/uL (ref 4.6–10.2)

## 2019-04-27 MED ORDER — GADOBUTROL 1 MMOL/ML IV SOLN
6.0000 mL | Freq: Once | INTRAVENOUS | Status: AC | PRN
  Administered 2019-04-27: 6 mL via INTRAVENOUS

## 2019-04-27 NOTE — Progress Notes (Signed)
102

## 2019-04-27 NOTE — Patient Instructions (Signed)
° ° ° °  If you have lab work done today you will be contacted with your lab results within the next 2 weeks.  If you have not heard from us then please contact us. The fastest way to get your results is to register for My Chart. ° ° °IF you received an x-ray today, you will receive an invoice from [facility]. Please contact  Radiology at [phone] with questions or concerns regarding your invoice.  ° °IF you received labwork today, you will receive an invoice from [facility]. Please contact [facility] at [phone] with questions or concerns regarding your invoice.  ° °Our billing staff will not be able to assist you with questions regarding bills from these companies. ° °You will be contacted with the lab results as soon as they are available. The fastest way to get your results is to activate your My Chart account. Instructions are located on the last page of this paperwork. If you have not heard from us regarding the results in 2 weeks, please contact this office. °  ° ° ° °

## 2019-04-28 ENCOUNTER — Telehealth: Payer: Self-pay | Admitting: Neurology

## 2019-04-29 ENCOUNTER — Emergency Department (HOSPITAL_COMMUNITY): Payer: Medicare Other

## 2019-04-29 ENCOUNTER — Ambulatory Visit: Payer: Self-pay | Admitting: *Deleted

## 2019-04-29 ENCOUNTER — Inpatient Hospital Stay (HOSPITAL_COMMUNITY)
Admission: EM | Admit: 2019-04-29 | Discharge: 2019-05-04 | DRG: 884 | Disposition: A | Discharge status: Home Health | Payer: Medicare Other | Attending: Internal Medicine | Admitting: Internal Medicine

## 2019-04-29 ENCOUNTER — Encounter (HOSPITAL_COMMUNITY): Payer: Self-pay | Admitting: Emergency Medicine

## 2019-04-29 ENCOUNTER — Other Ambulatory Visit: Payer: Self-pay

## 2019-04-29 ENCOUNTER — Telehealth: Payer: Self-pay | Admitting: Family Medicine

## 2019-04-29 DIAGNOSIS — J4520 Mild intermittent asthma, uncomplicated: Secondary | ICD-10-CM | POA: Diagnosis not present

## 2019-04-29 DIAGNOSIS — R0602 Shortness of breath: Secondary | ICD-10-CM

## 2019-04-29 DIAGNOSIS — J302 Other seasonal allergic rhinitis: Secondary | ICD-10-CM | POA: Diagnosis present

## 2019-04-29 DIAGNOSIS — Z7409 Other reduced mobility: Secondary | ICD-10-CM | POA: Diagnosis present

## 2019-04-29 DIAGNOSIS — Z85828 Personal history of other malignant neoplasm of skin: Secondary | ICD-10-CM

## 2019-04-29 DIAGNOSIS — R4182 Altered mental status, unspecified: Secondary | ICD-10-CM | POA: Diagnosis not present

## 2019-04-29 DIAGNOSIS — R410 Disorientation, unspecified: Secondary | ICD-10-CM | POA: Diagnosis present

## 2019-04-29 DIAGNOSIS — I10 Essential (primary) hypertension: Secondary | ICD-10-CM | POA: Diagnosis not present

## 2019-04-29 DIAGNOSIS — Z9842 Cataract extraction status, left eye: Secondary | ICD-10-CM

## 2019-04-29 DIAGNOSIS — G4733 Obstructive sleep apnea (adult) (pediatric): Secondary | ICD-10-CM | POA: Diagnosis present

## 2019-04-29 DIAGNOSIS — Z20828 Contact with and (suspected) exposure to other viral communicable diseases: Secondary | ICD-10-CM | POA: Diagnosis not present

## 2019-04-29 DIAGNOSIS — Z1159 Encounter for screening for other viral diseases: Secondary | ICD-10-CM

## 2019-04-29 DIAGNOSIS — E785 Hyperlipidemia, unspecified: Secondary | ICD-10-CM | POA: Diagnosis present

## 2019-04-29 DIAGNOSIS — R441 Visual hallucinations: Secondary | ICD-10-CM | POA: Diagnosis present

## 2019-04-29 DIAGNOSIS — E871 Hypo-osmolality and hyponatremia: Secondary | ICD-10-CM | POA: Diagnosis present

## 2019-04-29 DIAGNOSIS — J986 Disorders of diaphragm: Secondary | ICD-10-CM

## 2019-04-29 DIAGNOSIS — R269 Unspecified abnormalities of gait and mobility: Secondary | ICD-10-CM | POA: Diagnosis present

## 2019-04-29 DIAGNOSIS — N189 Chronic kidney disease, unspecified: Secondary | ICD-10-CM | POA: Diagnosis present

## 2019-04-29 DIAGNOSIS — S72351D Displaced comminuted fracture of shaft of right femur, subsequent encounter for closed fracture with routine healing: Secondary | ICD-10-CM | POA: Diagnosis not present

## 2019-04-29 DIAGNOSIS — I129 Hypertensive chronic kidney disease with stage 1 through stage 4 chronic kidney disease, or unspecified chronic kidney disease: Secondary | ICD-10-CM | POA: Diagnosis present

## 2019-04-29 DIAGNOSIS — L309 Dermatitis, unspecified: Secondary | ICD-10-CM | POA: Diagnosis present

## 2019-04-29 DIAGNOSIS — Z9841 Cataract extraction status, right eye: Secondary | ICD-10-CM

## 2019-04-29 DIAGNOSIS — I4891 Unspecified atrial fibrillation: Secondary | ICD-10-CM | POA: Diagnosis not present

## 2019-04-29 DIAGNOSIS — J984 Other disorders of lung: Secondary | ICD-10-CM | POA: Diagnosis present

## 2019-04-29 DIAGNOSIS — F05 Delirium due to known physiological condition: Secondary | ICD-10-CM | POA: Diagnosis present

## 2019-04-29 DIAGNOSIS — Z79899 Other long term (current) drug therapy: Secondary | ICD-10-CM

## 2019-04-29 DIAGNOSIS — M810 Age-related osteoporosis without current pathological fracture: Secondary | ICD-10-CM | POA: Diagnosis present

## 2019-04-29 DIAGNOSIS — R0902 Hypoxemia: Secondary | ICD-10-CM | POA: Diagnosis present

## 2019-04-29 DIAGNOSIS — F0391 Unspecified dementia with behavioral disturbance: Principal | ICD-10-CM | POA: Diagnosis present

## 2019-04-29 DIAGNOSIS — Z8673 Personal history of transient ischemic attack (TIA), and cerebral infarction without residual deficits: Secondary | ICD-10-CM

## 2019-04-29 DIAGNOSIS — Z8249 Family history of ischemic heart disease and other diseases of the circulatory system: Secondary | ICD-10-CM

## 2019-04-29 DIAGNOSIS — E440 Moderate protein-calorie malnutrition: Secondary | ICD-10-CM | POA: Diagnosis not present

## 2019-04-29 DIAGNOSIS — Z66 Do not resuscitate: Secondary | ICD-10-CM | POA: Diagnosis present

## 2019-04-29 DIAGNOSIS — K449 Diaphragmatic hernia without obstruction or gangrene: Secondary | ICD-10-CM | POA: Diagnosis present

## 2019-04-29 LAB — URINALYSIS, ROUTINE W REFLEX MICROSCOPIC
Bilirubin Urine: NEGATIVE
Glucose, UA: NEGATIVE mg/dL
Hgb urine dipstick: NEGATIVE
Ketones, ur: NEGATIVE mg/dL
Leukocytes,Ua: NEGATIVE
Nitrite: NEGATIVE
Protein, ur: NEGATIVE mg/dL
Specific Gravity, Urine: 1.011 (ref 1.005–1.030)
pH: 7 (ref 5.0–8.0)

## 2019-04-29 LAB — BASIC METABOLIC PANEL
Anion gap: 10 (ref 5–15)
BUN/Creatinine Ratio: 12 (ref 10–24)
BUN: 11 mg/dL (ref 8–27)
BUN: 13 mg/dL (ref 8–23)
CO2: 20 mmol/L (ref 20–29)
CO2: 24 mmol/L (ref 22–32)
Calcium: 9.5 mg/dL (ref 8.6–10.2)
Calcium: 9 mg/dL (ref 8.9–10.3)
Chloride: 94 mmol/L — ABNORMAL LOW (ref 96–106)
Chloride: 97 mmol/L — ABNORMAL LOW (ref 98–111)
Creatinine, Ser: 0.92 mg/dL (ref 0.76–1.27)
Creatinine, Ser: 1 mg/dL (ref 0.61–1.24)
GFR calc Af Amer: 87 mL/min/{1.73_m2} (ref >59)
GFR calc Af Amer: >60 mL/min (ref >60)
GFR calc non Af Amer: 76 mL/min/{1.73_m2} (ref >59)
GFR calc non Af Amer: >60 mL/min (ref >60)
Glucose, Bld: 103 mg/dL — ABNORMAL HIGH (ref 70–99)
Glucose: 101 mg/dL — ABNORMAL HIGH (ref 65–99)
Potassium: 4.5 mmol/L (ref 3.5–5.2)
Potassium: 4.7 mmol/L (ref 3.5–5.1)
Sodium: 131 mmol/L — ABNORMAL LOW (ref 134–144)
Sodium: 131 mmol/L — ABNORMAL LOW (ref 135–145)

## 2019-04-29 LAB — HEPATIC FUNCTION PANEL
ALT: 13 U/L (ref 0–44)
AST: 23 U/L (ref 15–41)
Albumin: 3.6 g/dL (ref 3.5–5.0)
Alkaline Phosphatase: 78 U/L (ref 38–126)
Bilirubin, Direct: 0.2 mg/dL (ref 0.0–0.2)
Indirect Bilirubin: 0.6 mg/dL (ref 0.3–0.9)
Total Bilirubin: 0.8 mg/dL (ref 0.3–1.2)
Total Protein: 6.7 g/dL (ref 6.5–8.1)

## 2019-04-29 LAB — CBC
HCT: 52.8 % — ABNORMAL HIGH (ref 39.0–52.0)
Hemoglobin: 17.7 g/dL — ABNORMAL HIGH (ref 13.0–17.0)
MCH: 31.5 pg (ref 26.0–34.0)
MCHC: 33.5 g/dL (ref 30.0–36.0)
MCV: 94 fL (ref 80.0–100.0)
Platelets: 251 10*3/uL (ref 150–400)
RBC: 5.62 MIL/uL (ref 4.22–5.81)
RDW: 13.2 % (ref 11.5–15.5)
WBC: 11 10*3/uL — ABNORMAL HIGH (ref 4.0–10.5)
nRBC: 0 % (ref 0.0–0.2)

## 2019-04-29 LAB — TSH
TSH: 1.78 u[IU]/mL (ref 0.450–4.500)
TSH: 1.624 u[IU]/mL (ref 0.350–4.500)

## 2019-04-29 LAB — AMMONIA: Ammonia: 17 umol/L (ref 9–35)

## 2019-04-29 LAB — VITAMIN B12: Vitamin B-12: 418 pg/mL (ref 232–1245)

## 2019-04-29 MED ORDER — SODIUM CHLORIDE 0.9% FLUSH: 3.0000 mL | Freq: Once | INTRAVENOUS | Status: DC

## 2019-04-29 NOTE — ED Triage Notes (Signed)
Pt states I do not know why I am here today. Denies SOb/CP. Pt's caregiver told sort RN that he had episodes of Afib and bradycardia episodes. Caregiver left, this RN unable to talk with her.

## 2019-04-29 NOTE — Telephone Encounter (Signed)
Advised to continue outpatient skilled nursing but will call his daughter to discuss that patient needs to go to the ER

## 2019-04-29 NOTE — ED Provider Notes (Signed)
[facility] EMERGENCY DEPARTMENT Provider Note   CSN: [id number] Arrival date & time: 04/29/19  1446    History   Chief Complaint No chief complaint on file.   HPI [name] is a 85 y.o. male.     85 year old male with past medical history below including CVA, hypertension, hyperlipidemia who presents with altered mental status and low heart rate.  Caregiver reports that over the past week he has had progressively worsening delirium including reaching out for things that are not actually there and sometimes telling nonsensical stories.  This has waxed and waned but seems to be worsening.  He has been evaluated by his PCP and tests so far have been negative.  He had an MRI on 6/30 which was negative.  She states that he she checks his blood pressure and heart rate 3 times daily and they have been stable, heart rate usually in the 60s.  Today he was seen by home health nurse who was concerned that his heart rate was low, sometimes in the 30s and 40s and irregular.  She was instructed him to send him to the ED for evaluation.  The only medication change is recently discontinuing Plavix.  He has had poor sleep the last 2 nights.  He has chronic cough that is sometimes worse when he is eating but the cough has been unchanged recently.  No fevers, vomiting, diarrhea, or other infectious symptoms.  He has been complaining of headaches intermittently for the past 2 weeks for which she has given him Tylenol.  He urinates frequently.  LEVEL 5 CAVEAT DUE TO AMS  The history is provided by a caregiver.    Past Medical History:  Diagnosis Date  . Allergy    seasonal  fall  . Cancer ([place])    Basal cell carcinoma; multiple  . Eczema   . Hyperlipidemia   . Hypertension   . Osteoporosis   . Stroke [facility])     Patient Active Problem List   Diagnosis Date Noted  . Abnormal weight loss 03/23/2019  . Pain in joint of left shoulder 03/23/2019  . Frequent urination 03/23/2019  .  Constipation 03/23/2019  . TIA (transient ischemic attack) 03/08/2019  . Hyponatremia 03/08/2019  . Impaired mobility 01/13/2019  . Seasonal allergies 01/13/2019  . Hiatal hernia 01/13/2019  . Pain in right hip 12/24/2018  . S/P right hip fracture 11/11/2018  . Recurrent falls 05/12/2018  . History of CVA (cerebrovascular accident) 01/16/2018  . Nocturnal enuresis   . Fall   . Full incontinence of feces   . Essential hypertension   . Hypoalbuminemia due to protein-calorie malnutrition ([place])   . Abnormality of gait   . Leg edema   . Hip fracture ([place]) 03/30/2017  . Leukocytosis 03/29/2017  . Mild intermittent asthma with acute exacerbation 01/29/2017  . Chronic renal insufficiency 09/22/2015  . Tachycardia 09/22/2015  . PVC (premature ventricular contraction) 09/22/2015  . Osteoporosis 06/03/2014  . Basal cell carcinoma of face 06/03/2014  . Benign prostatic hyperplasia 05/27/2013  . Eczema 04/23/2012  . Dyslipidemia 04/23/2012    Past Surgical History:  Procedure Laterality Date  . CATARACT EXTRACTION, BILATERAL  06/29/2015  . INTRAMEDULLARY (IM) NAIL INTERTROCHANTERIC Left 03/30/2017   Procedure: INTRAMEDULLARY (IM) NAIL INTERTROCHANTRIC;  Surgeon: [name], MD;  Location: [place];  Service: Orthopedics;  Laterality: Left;  . INTRAMEDULLARY (IM) NAIL INTERTROCHANTERIC Right 11/12/2018   Procedure: INTRAMEDULLARY (IM) NAIL RIGHT INTERTROCHANTERIC HIP FRACTURE;  Surgeon: [name], MD;  Location: [place];  Service: Orthopedics;  Laterality: Right;  . MOHS SURGERY    . TONSILLECTOMY  1940 maybe        Home Medications    Prior to Admission medications   Medication Sig Start Date End Date Taking? Authorizing Provider  acetaminophen (TYLENOL) 500 MG tablet Take 500-1,000 mg by mouth every 6 (six) hours as needed (for headaches).   Yes [provider]  albuterol (PROVENTIL HFA;VENTOLIN HFA) 108 (90 Base) MCG/ACT inhaler Inhale 2 puffs into the lungs every 6  (six) hours as needed for wheezing or shortness of breath. 09/29/18  Yes Forrest Moron, MD  alendronate (FOSAMAX) 70 MG tablet Take 1 tablet (70 mg total) by mouth every 7 (seven) days. Take with a full glass of water on an empty stomach. Patient taking differently: Take 70 mg by mouth every Saturday. Take with a full glass of water on an empty stomach 07/27/18  Yes Rutherford Guys, MD  beclomethasone (QVAR REDIHALER) 40 MCG/ACT inhaler Inhale 1 puff into the lungs 2 (two) times daily. 02/05/19  Yes Rutherford Guys, MD  docusate sodium (COLACE) 100 MG capsule Take 1 capsule (100 mg total) by mouth daily. Patient taking differently: Take 100 mg by mouth daily as needed for mild constipation.  03/09/19  Yes Vann, Jessica U, DO  pravastatin (PRAVACHOL) 40 MG tablet Take 1 tablet (40 mg total) by mouth daily. 07/27/18  Yes Rutherford Guys, MD  Blood Pressure KIT Please check BP twice a day 03/25/19   Rutherford Guys, MD  metoprolol succinate (TOPROL-XL) 50 MG 24 hr tablet Take 1 tablet (50 mg total) by mouth daily. Take with or immediately following a meal. Patient taking differently: Take 50 mg by mouth daily. Take with or immediately following a meal 07/27/18   Rutherford Guys, MD  Misc. Devices [facility]) MISC Use walker whenever walking, standard walker 07/27/18   Rutherford Guys, MD  senna-docusate (SENOKOT-S) 8.6-50 MG tablet Take 1 tablet by mouth at bedtime as needed for moderate constipation. Patient not taking: Reported on 04/29/2019 03/09/19   Geradine Girt, DO    Family History Family History  Problem Relation Age of Onset  . Arthritis Mother   . Heart disease Mother 32  . Hypertension Father     Social History Social History   Tobacco Use  . Smoking status: Never Smoker  . Smokeless tobacco: Never Used  Substance Use Topics  . Alcohol use: No    Comment: occas  . Drug use: No     Allergies   Other   Review of Systems Review of Systems  Unable to perform ROS: Mental  status change     Physical Exam Updated Vital Signs BP (!) 118/92   Pulse 77   Temp 97.9 F (36.6 C) (Oral)   Resp (!) 23   SpO2 95%   Physical Exam Vitals signs and nursing note reviewed.  Constitutional:      General: He is not in acute distress.    Appearance: He is well-developed.  HENT:     Head: Normocephalic and atraumatic.     Nose: Nose normal.     Mouth/Throat:     Mouth: Mucous membranes are moist.     Pharynx: Oropharynx is clear.  Eyes:     Conjunctiva/sclera: Conjunctivae normal.     Pupils: Pupils are equal, round, and reactive to light.  Neck:     Musculoskeletal: Neck supple.  Cardiovascular:  Rate and Rhythm: Normal rate and regular rhythm.     Heart sounds: Normal heart sounds. No murmur.  Pulmonary:     Effort: Pulmonary effort is normal.     Breath sounds: Normal breath sounds.  Abdominal:     General: Bowel sounds are normal. There is no distension.     Palpations: Abdomen is soft.     Tenderness: There is no abdominal tenderness.  Musculoskeletal:        General: No swelling.  Skin:    General: Skin is warm and dry.  Neurological:     Mental Status: He is alert.     Deep Tendon Reflexes: Reflexes normal.     Comments: Fluent speech, oriented x 2 (person and place), following basic commands; 5/5 strength throughout, no clonus  Psychiatric:     Comments: Quiet, flat affect      ED Treatments / Results  Labs (all labs ordered are listed, but only abnormal results are displayed) Labs Reviewed  BASIC METABOLIC PANEL - Abnormal; Notable for the following components:      Result Value   Sodium 131 (*)    Chloride 97 (*)    Glucose, Bld 103 (*)    All other components within normal limits  CBC - Abnormal; Notable for the following components:   WBC 11.0 (*)    Hemoglobin 17.7 (*)    HCT 52.8 (*)    All other components within normal limits  SARS CORONAVIRUS 2 (HOSPITAL ORDER, [place] [facility])  TSH  HEPATIC  FUNCTION PANEL  AMMONIA  URINALYSIS, ROUTINE W REFLEX MICROSCOPIC    EKG EKG Interpretation  Date/Time:  Thursday April 29 2019 14:57:18 EDT Ventricular Rate:  77 PR Interval:  144 QRS Duration: 86 QT Interval:  404 QTC Calculation: 457 R Axis:   38 Text Interpretation:  Sinus rhythm with frequent Premature ventricular complexes Otherwise normal ECG frequent PVCs are similar to previous Confirmed by Theotis Burrow [phone]) on 04/29/2019 4:30:25 PM   Radiology Dg Chest 2 View  Result Date: 04/29/2019 CLINICAL DATA:  85 year old male with atrial fibrillation. EXAM: CHEST - 2 VIEW COMPARISON:  01/13/2019 and earlier. FINDINGS: Upright AP and lateral views of the chest. Chronic elevation of the right hemidiaphragm. Stable lung volumes. Chronic hiatal hernia. No cardiomegaly. Other mediastinal contours are within normal limits. Chronic rightward deviation of the trachea at the thoracic inlet. No pneumothorax, pulmonary edema, pleural effusion or acute pulmonary opacity. Negative visible bowel gas pattern. Osteopenia. No acute osseous abnormality identified. Abdominal Calcified aortic atherosclerosis. IMPRESSION: 1. No acute cardiopulmonary abnormality. 2. Chronic hiatal hernia and elevation of the right hemidiaphragm. 3. Chronic left thyroid goiter suspected. Electronically Signed   By: Genevie Ann M.D.   On: 04/29/2019 15:44    Procedures Procedures (including critical care time)  Medications Ordered in ED Medications  sodium chloride flush (NS) 0.9 % injection 3 mL (3 mLs Intravenous Not Given 04/29/19 2328)     Initial Impression / Assessment and Plan / ED Course  I have reviewed the triage vital signs and the nursing notes.  Pertinent labs & imaging results that were available during my care of the patient were reviewed by me and considered in my medical decision making (see chart for details).       PT calm and cooperative on exam, disoriented to time. BP 139/105 but remainder of VS  normal. DDX for AMS is broad and includes infectious process, electrolyte disturbance, less likely intracranial process as MRI  2 days ago was negative acute.  CXR negative, Na stable at 131, UA without infection, normal CMP, normal ammonia and TSH. CXR clear.  I had a long discussion with the patient's daughter who states that he has had a very precipitous decline in his mental status and escalation of visual hallucinations in the last 3 weeks.  She notes that prior to this, he was functional and they had all gone on vacation together.  The last 2 nights have been almost unbearable at home. Discussed admission w/ [facility], Dr. Jonelle Sidle, who will admit for further w/u.  Final Clinical Impressions(s) / ED Diagnoses   Final diagnoses:  Altered mental status, unspecified altered mental status type  Visual hallucinations    ED Discharge Orders    None       Loel Betancur, Wenda Overland, MD 04/29/19 2347

## 2019-04-29 NOTE — Telephone Encounter (Signed)
Copied from [place] [id number]. Topic: General - Other >> Apr 28, 2019  4:19 PM Ivar Drape wrote: Reason for CRM:   Patient would like the results of the recent MRI and Labs.

## 2019-04-29 NOTE — Telephone Encounter (Addendum)
Mo- [facility]- calling to report low- irregular rhythm - pulse every third beat with osculation. Manual pulse- 56 ( O2 sat pulse readings are 30-40) Patient is not having symptoms- O2 sat 96% room air Requesting verbal orders for skilled nursing once a week for 4 weeks.  Office is closed at this time- will send note high priority and office has been contacted for review by provider in office today. ( Skype note sent to [employer]- to alert them)

## 2019-04-29 NOTE — Telephone Encounter (Signed)
Spoke with Caregiver [name] and advised that the patient go to the ER for bradycardia as well as altered mental status.  Wt Readings from Last 3 Encounters:  04/27/19 135 lb 9.6 oz (61.5 kg)  04/01/19 148 lb (67.1 kg)  03/23/19 132 lb (59.9 kg)

## 2019-04-30 ENCOUNTER — Observation Stay (HOSPITAL_COMMUNITY): Payer: Medicare Other

## 2019-04-30 ENCOUNTER — Ambulatory Visit: Payer: Medicare Other | Admitting: Family Medicine

## 2019-04-30 DIAGNOSIS — N189 Chronic kidney disease, unspecified: Secondary | ICD-10-CM | POA: Diagnosis present

## 2019-04-30 DIAGNOSIS — Z7409 Other reduced mobility: Secondary | ICD-10-CM

## 2019-04-30 DIAGNOSIS — F05 Delirium due to known physiological condition: Secondary | ICD-10-CM | POA: Diagnosis present

## 2019-04-30 DIAGNOSIS — G4733 Obstructive sleep apnea (adult) (pediatric): Secondary | ICD-10-CM | POA: Diagnosis present

## 2019-04-30 DIAGNOSIS — R441 Visual hallucinations: Secondary | ICD-10-CM | POA: Diagnosis present

## 2019-04-30 DIAGNOSIS — F0391 Unspecified dementia with behavioral disturbance: Secondary | ICD-10-CM | POA: Diagnosis present

## 2019-04-30 DIAGNOSIS — Z8673 Personal history of transient ischemic attack (TIA), and cerebral infarction without residual deficits: Secondary | ICD-10-CM | POA: Diagnosis not present

## 2019-04-30 DIAGNOSIS — I129 Hypertensive chronic kidney disease with stage 1 through stage 4 chronic kidney disease, or unspecified chronic kidney disease: Secondary | ICD-10-CM | POA: Diagnosis present

## 2019-04-30 DIAGNOSIS — Z9842 Cataract extraction status, left eye: Secondary | ICD-10-CM | POA: Diagnosis not present

## 2019-04-30 DIAGNOSIS — J302 Other seasonal allergic rhinitis: Secondary | ICD-10-CM | POA: Diagnosis present

## 2019-04-30 DIAGNOSIS — R4182 Altered mental status, unspecified: Secondary | ICD-10-CM | POA: Diagnosis present

## 2019-04-30 DIAGNOSIS — R269 Unspecified abnormalities of gait and mobility: Secondary | ICD-10-CM | POA: Diagnosis present

## 2019-04-30 DIAGNOSIS — K449 Diaphragmatic hernia without obstruction or gangrene: Secondary | ICD-10-CM | POA: Diagnosis present

## 2019-04-30 DIAGNOSIS — Z1159 Encounter for screening for other viral diseases: Secondary | ICD-10-CM | POA: Diagnosis not present

## 2019-04-30 DIAGNOSIS — E871 Hypo-osmolality and hyponatremia: Secondary | ICD-10-CM | POA: Diagnosis present

## 2019-04-30 DIAGNOSIS — R410 Disorientation, unspecified: Secondary | ICD-10-CM | POA: Diagnosis present

## 2019-04-30 DIAGNOSIS — G459 Transient cerebral ischemic attack, unspecified: Secondary | ICD-10-CM | POA: Diagnosis not present

## 2019-04-30 DIAGNOSIS — Z66 Do not resuscitate: Secondary | ICD-10-CM | POA: Diagnosis present

## 2019-04-30 DIAGNOSIS — R0902 Hypoxemia: Secondary | ICD-10-CM | POA: Diagnosis present

## 2019-04-30 DIAGNOSIS — M810 Age-related osteoporosis without current pathological fracture: Secondary | ICD-10-CM | POA: Diagnosis present

## 2019-04-30 DIAGNOSIS — Z85828 Personal history of other malignant neoplasm of skin: Secondary | ICD-10-CM | POA: Diagnosis not present

## 2019-04-30 DIAGNOSIS — J986 Disorders of diaphragm: Secondary | ICD-10-CM | POA: Diagnosis present

## 2019-04-30 DIAGNOSIS — E785 Hyperlipidemia, unspecified: Secondary | ICD-10-CM | POA: Diagnosis present

## 2019-04-30 DIAGNOSIS — L309 Dermatitis, unspecified: Secondary | ICD-10-CM | POA: Diagnosis present

## 2019-04-30 DIAGNOSIS — I10 Essential (primary) hypertension: Secondary | ICD-10-CM

## 2019-04-30 DIAGNOSIS — Z9841 Cataract extraction status, right eye: Secondary | ICD-10-CM | POA: Diagnosis not present

## 2019-04-30 DIAGNOSIS — Z8249 Family history of ischemic heart disease and other diseases of the circulatory system: Secondary | ICD-10-CM | POA: Diagnosis not present

## 2019-04-30 DIAGNOSIS — J984 Other disorders of lung: Secondary | ICD-10-CM | POA: Diagnosis present

## 2019-04-30 DIAGNOSIS — R0602 Shortness of breath: Secondary | ICD-10-CM | POA: Diagnosis not present

## 2019-04-30 DIAGNOSIS — Z79899 Other long term (current) drug therapy: Secondary | ICD-10-CM | POA: Diagnosis not present

## 2019-04-30 LAB — COMPREHENSIVE METABOLIC PANEL
ALT: 13 U/L (ref 0–44)
AST: 20 U/L (ref 15–41)
Albumin: 3.5 g/dL (ref 3.5–5.0)
Alkaline Phosphatase: 76 U/L (ref 38–126)
Anion gap: 10 (ref 5–15)
BUN: 10 mg/dL (ref 8–23)
CO2: 28 mmol/L (ref 22–32)
Calcium: 9 mg/dL (ref 8.9–10.3)
Chloride: 95 mmol/L — ABNORMAL LOW (ref 98–111)
Creatinine, Ser: 1.05 mg/dL (ref 0.61–1.24)
GFR calc Af Amer: >60 mL/min (ref >60)
GFR calc non Af Amer: >60 mL/min (ref >60)
Glucose, Bld: 100 mg/dL — ABNORMAL HIGH (ref 70–99)
Potassium: 3.9 mmol/L (ref 3.5–5.1)
Sodium: 133 mmol/L — ABNORMAL LOW (ref 135–145)
Total Bilirubin: 1.1 mg/dL (ref 0.3–1.2)
Total Protein: 6.7 g/dL (ref 6.5–8.1)

## 2019-04-30 LAB — SARS CORONAVIRUS 2 BY RT PCR (HOSPITAL ORDER, PERFORMED IN ~~LOC~~ HOSPITAL LAB): SARS Coronavirus 2: NEGATIVE

## 2019-04-30 LAB — CBC
HCT: 42.7 % (ref 39.0–52.0)
Hemoglobin: 14.9 g/dL (ref 13.0–17.0)
MCH: 31.8 pg (ref 26.0–34.0)
MCHC: 34.9 g/dL (ref 30.0–36.0)
MCV: 91.2 fL (ref 80.0–100.0)
Platelets: 216 10*3/uL (ref 150–400)
RBC: 4.68 MIL/uL (ref 4.22–5.81)
RDW: 12.9 % (ref 11.5–15.5)
WBC: 8.5 10*3/uL (ref 4.0–10.5)
nRBC: 0 % (ref 0.0–0.2)

## 2019-04-30 MED ORDER — ONDANSETRON HCL 4 MG/2ML IJ SOLN: 4.0000 mg | Freq: Four times a day (QID) | INTRAMUSCULAR | Status: DC | PRN

## 2019-04-30 MED ORDER — DEXTROSE-NACL 5-0.45 % IV SOLN
INTRAVENOUS | Status: DC
  Administered 2019-04-30: 03:00:00 via INTRAVENOUS

## 2019-04-30 MED ORDER — BUDESONIDE 0.5 MG/2ML IN SUSP
2.0000 mL | Freq: Two times a day (BID) | RESPIRATORY_TRACT | Status: DC
  Filled 2019-04-30: qty 2

## 2019-04-30 MED ORDER — METOPROLOL SUCCINATE ER 50 MG PO TB24
50.0000 mg | ORAL_TABLET | Freq: Every day | ORAL | Status: DC
  Administered 2019-04-30 – 2019-05-04 (×5): 50 mg via ORAL
  Filled 2019-04-30 (×5): qty 1

## 2019-04-30 MED ORDER — SENNOSIDES-DOCUSATE SODIUM 8.6-50 MG PO TABS: 1.0000 | ORAL_TABLET | Freq: Every evening | ORAL | Status: DC | PRN

## 2019-04-30 MED ORDER — TRAZODONE HCL 50 MG PO TABS
25.0000 mg | ORAL_TABLET | Freq: Every day | ORAL | Status: DC
  Administered 2019-04-30 – 2019-05-01 (×2): 25 mg via ORAL
  Filled 2019-04-30 (×2): qty 1

## 2019-04-30 MED ORDER — BISACODYL 10 MG RE SUPP
10.0000 mg | Freq: Once | RECTAL | Status: AC
  Administered 2019-04-30: 10 mg via RECTAL
  Filled 2019-04-30: qty 1

## 2019-04-30 MED ORDER — ONDANSETRON HCL 4 MG PO TABS: 4.0000 mg | ORAL_TABLET | Freq: Four times a day (QID) | ORAL | Status: DC | PRN

## 2019-04-30 MED ORDER — PRAVASTATIN SODIUM 40 MG PO TABS
40.0000 mg | ORAL_TABLET | Freq: Every day | ORAL | Status: DC
  Administered 2019-04-30 – 2019-05-04 (×5): 40 mg via ORAL
  Filled 2019-04-30 (×5): qty 1

## 2019-04-30 MED ORDER — DOCUSATE SODIUM 100 MG PO CAPS: 100.0000 mg | ORAL_CAPSULE | Freq: Every day | ORAL | Status: DC | PRN

## 2019-04-30 MED ORDER — ALBUTEROL SULFATE (2.5 MG/3ML) 0.083% IN NEBU: 3.0000 mL | INHALATION_SOLUTION | Freq: Four times a day (QID) | RESPIRATORY_TRACT | Status: DC | PRN

## 2019-04-30 MED ORDER — ACETAMINOPHEN 500 MG PO TABS: 500.0000 mg | ORAL_TABLET | Freq: Four times a day (QID) | ORAL | Status: DC | PRN

## 2019-04-30 MED ORDER — ENOXAPARIN SODIUM 40 MG/0.4ML ~~LOC~~ SOLN
40.0000 mg | SUBCUTANEOUS | Status: DC
  Administered 2019-04-30 – 2019-05-04 (×5): 40 mg via SUBCUTANEOUS
  Filled 2019-04-30 (×5): qty 0.4

## 2019-04-30 NOTE — Procedures (Signed)
ELECTROENCEPHALOGRAM REPORT   Patient: [name]       Room #: 3E22C EEG No. ID: [id number] Age: 85 y.o.        Sex: male Referring Physician: Eliseo Squires Report Date:  04/30/2019        Interpreting Physician: Alexis Goodell  History: Daronte Shostak is an 85 y.o. male with altered mental status  Medications:  Toprol, Pravachol, Desyrel  Conditions of Recording:  This is a 21 channel routine scalp EEG performed with bipolar and monopolar montages arranged in accordance to the international 10/20 system of electrode placement. One channel was dedicated to EKG recording.  The patient is in the awake, drowsy and asleep states.  Description:  The patient is awake only briefly during the recording.  During this time the waking background activity consists of a low voltage, symmetrical, fairly well organized, 8 Hz alpha activity, seen from the parieto-occipital and posterior temporal regions.  Low voltage fast activity, poorly organized, is seen anteriorly and is at times superimposed on more posterior regions.  A mixture of theta and alpha rhythms are seen from the central and temporal regions. The patient drowses with slowing to irregular, low voltage theta and beta activity.   The patient is in to a light sleep for the majority of the recording with symmetrical sleep spindles, vertex central sharp transients and irregular slow activity.  No epileptiform activity is noted.   Hyperventilation and intermittent photic stimulation were not performed.  IMPRESSION: Normal electroencephalogram, awake and asleep. There are no focal lateralizing or epileptiform features.   Alexis Goodell, MD Neurology [phone] 04/30/2019, 10:49 AM

## 2019-04-30 NOTE — Progress Notes (Signed)
Patient been confused, frequently climbing out of the bed. Tried to redirect , NT stayed with the pt for a while, pt calm down for  A little bit and will try to  Get out of the bed. Bed alarm on. Will monitor accordingly.

## 2019-04-30 NOTE — Evaluation (Signed)
Physical Therapy Evaluation Patient Details Name: [name] MRN: [id number] DOB: 07-27-1932 Today's Date: 04/30/2019   History of Present Illness  Patient is a 85 y/o male presenting to the ED on 04/29/2019 with confusion. Past medical history significant of seasonal allergies, basal cell carcinoma, hyperlipidemia, hypertension, osteoporosis and eczema. Admitted for AMS with continued work-up.     Clinical Impression  Mr. Hantz is a very pleasant 85 y/o male admitted with the above listed diagnosis. Patient not oriented to time or situation, however is able to state home set-up and PLOF that correlates with chart review from prior admission. Patient requiring light Min A for functional mobility for safety. Ambulating with RW with most difficulty with obstacle navigation. If caregivers are able to continue level of care patient requires will recommend return home with HHPT at discharge, otherwise may need to consider higher level of care. PT to continue to follow.     Follow Up Recommendations Home health PT;Supervision/Assistance - 24 hour    Equipment Recommendations  None recommended by PT    Recommendations for Other Services       Precautions / Restrictions Precautions Precautions: Fall Restrictions Weight Bearing Restrictions: No      Mobility  Bed Mobility               General bed mobility comments: up in chair  Transfers Overall transfer level: Needs assistance Equipment used: Rolling walker (2 wheeled) Transfers: Sit to/from Stand Sit to Stand: Min guard;Min assist         General transfer comment: sit to stand from chair and toilet with light Min A  Ambulation/Gait Ambulation/Gait assistance: Min guard;Min assist Gait Distance (Feet): 50 Feet Assistive device: Rolling walker (2 wheeled) Gait Pattern/deviations: Step-to pattern;Step-through pattern;Decreased stride length;Trunk flexed Gait velocity: decreased   General Gait Details: unsteady gait pattern  - some difficulty with navigating obstacles - no LOB  Stairs            Wheelchair Mobility    Modified Rankin (Stroke Patients Only)       Balance Overall balance assessment: Needs assistance Sitting-balance support: No upper extremity supported;Feet supported Sitting balance-Leahy Scale: Good     Standing balance support: Bilateral upper extremity supported;During functional activity Standing balance-Leahy Scale: Poor Standing balance comment: reliant on RW                             Pertinent Vitals/Pain Pain Assessment: No/denies pain    Home Living Family/patient expects to be discharged to:: Private residence Living Arrangements: Alone Available Help at Discharge: Available 24 hours/day;Personal care attendant Type of Home: House Home Access: Stairs to enter Entrance Stairs-Rails: Right;Left;Can reach both Entrance Stairs-Number of Steps: 7 Home Layout: One level Home Equipment: Tub bench;Cane - single point;Walker - 2 wheels;Bedside commode      Prior Function Level of Independence: Needs assistance   Gait / Transfers Assistance Needed: uses RW and sometimes w/c  ADL's / Homemaking Assistance Needed: caregiver assist with dressing/bathing as needed, cooking,  dtr does grocery shopping  Comments: reports caregiver is present 24/7     Hand Dominance   Dominant Hand: Right    Extremity/Trunk Assessment   Upper Extremity Assessment Upper Extremity Assessment: Defer to OT evaluation    Lower Extremity Assessment Lower Extremity Assessment: Generalized weakness    Cervical / Trunk Assessment Cervical / Trunk Assessment: Kyphotic  Communication   Communication: HOH  Cognition Arousal/Alertness: Awake/alert Behavior During Therapy: [facility]  for tasks assessed/performed Overall Cognitive Status: No family/caregiver present to determine baseline cognitive functioning Area of Impairment: Orientation;Following  commands;Safety/judgement;Problem solving                 Orientation Level: Disoriented to;Time;Situation     Following Commands: Follows one step commands consistently;Follows multi-step commands inconsistently Safety/Judgement: Decreased awareness of safety   Problem Solving: Slow processing;Requires verbal cues        General Comments      Exercises     Assessment/Plan    PT Assessment Patient needs continued PT services  PT Problem List Decreased strength;Decreased activity tolerance;Decreased balance;Decreased mobility;Decreased knowledge of use of DME;Decreased safety awareness       PT Treatment Interventions DME instruction;Gait training;Stair training;Therapeutic activities;Functional mobility training;Therapeutic exercise;Patient/family education    PT Goals (Current goals can be found in the Care Plan section)  Acute Rehab PT Goals Patient Stated Goal: return home soon PT Goal Formulation: With patient Time For Goal Achievement: 05/14/19 Potential to Achieve Goals: Good    Frequency Min 3X/week   Barriers to discharge        Co-evaluation               AM-PAC PT "6 Clicks" Mobility  Outcome Measure Help needed turning from your back to your side while in a flat bed without using bedrails?: A Little Help needed moving from lying on your back to sitting on the side of a flat bed without using bedrails?: A Little Help needed moving to and from a bed to a chair (including a wheelchair)?: A Little Help needed standing up from a chair using your arms (e.g., wheelchair or bedside chair)?: A Little Help needed to walk in hospital room?: A Little Help needed climbing 3-5 steps with a railing? : A Lot 6 Click Score: 17    End of Session Equipment Utilized During Treatment: Gait belt Activity Tolerance: Patient tolerated treatment well Patient left: in chair;with call bell/phone within reach;with chair alarm set Nurse Communication: Mobility  status PT Visit Diagnosis: Unsteadiness on feet (R26.81);Other abnormalities of gait and mobility (R26.89);Muscle weakness (generalized) (M62.81)    Time: 1040-1108 PT Time Calculation (min) (ACUTE ONLY): 28 min   Charges:   PT Evaluation $PT Eval Moderate Complexity: 1 Mod PT Treatments $Gait Training: 8-22 mins        Lanney Gins, PT, DPT Supplemental Physical Therapist 04/30/19 12:54 PM Pager: [phone] Office: [phone]

## 2019-04-30 NOTE — Progress Notes (Signed)
Occupational Therapy Evaluation Patient Details Name: [name] MRN: [id number] DOB: 09/03/1932 Today's Date: 04/30/2019    History of Present Illness Patient is a 85 y/o male presenting to the ED on 04/29/2019 with confusion. Past medical history significant of seasonal allergies, basal cell carcinoma, hyperlipidemia, hypertension, osteoporosis and eczema. Admitted for AMS with continued work-up.    Clinical Impression   Pt admitted with deficits listed above. Pt is very pleasant and cooperative during therapy. Pt describes home set up and PLOF which correlates with chart review from current and previous admissions. Pt requiring min-mod assist for UB/LB bathing and dressing. Upon therapist arrival for session, pt laying in bed soaked in urine but complete unaware of having soiled himself. Therapist assisted him with clean up and replaced soiled linens. Min assist to power up into sit<>Stand and min guard to ambulate in room with RW. Pt will require 24/7 supervision/assist at home. Recommending return home if this level of assist can continue to be provided. Also recommending [place] services. Will continue to follow acutely.    Follow Up Recommendations  Home health OT;Supervision/Assistance - 24 hour    Equipment Recommendations  None recommended by OT    Recommendations for Other Services       Precautions / Restrictions Precautions Precautions: Fall Restrictions Weight Bearing Restrictions: No      Mobility Bed Mobility Overal bed mobility: Needs Assistance Bed Mobility: Supine to Sit;Sit to Supine     Supine to sit: Supervision;HOB elevated Sit to supine: Supervision;HOB elevated   General bed mobility comments: supervision for safety  Transfers Overall transfer level: Needs assistance Equipment used: Rolling walker (2 wheeled) Transfers: Sit to/from Stand Sit to Stand: Min assist         General transfer comment: Assist to power up and for steadying once in  standing.    Balance Overall balance assessment: Needs assistance Sitting-balance support: No upper extremity supported;Feet supported Sitting balance-Leahy Scale: Good     Standing balance support: Bilateral upper extremity supported;During functional activity Standing balance-Leahy Scale: Poor Standing balance comment: reliant on RW                           ADL either performed or assessed with clinical judgement   ADL Overall ADL's : Needs assistance/impaired     Grooming: Supervision/safety;Sitting   Upper Body Bathing: Minimal assistance;Sitting   Lower Body Bathing: Minimal assistance;Sit to/from stand   Upper Body Dressing : Sitting;Minimal assistance   Lower Body Dressing: Moderate assistance;Sit to/from stand   Toilet Transfer: Minimal assistance;Ambulation;RW   Toileting- Clothing Manipulation and Hygiene: Minimal assistance;Sit to/from stand       Functional mobility during ADLs: Min guard;Rolling walker General ADL Comments: Pt incontinent of urine in bed and gown was soaked. Therapist assisted him with donning new gown and replaced soiled bedding/linens.     Vision Baseline Vision/History: Wears glasses Wears Glasses: Reading only Vision Assessment?: No apparent visual deficits     Perception     Praxis      Pertinent Vitals/Pain Pain Assessment: No/denies pain     Hand Dominance Right   Extremity/Trunk Assessment Upper Extremity Assessment Upper Extremity Assessment: Overall WFL for tasks assessed;Generalized weakness   Lower Extremity Assessment Lower Extremity Assessment: Defer to PT evaluation   Cervical / Trunk Assessment Cervical / Trunk Assessment: Kyphotic   Communication Communication Communication: HOH   Cognition Arousal/Alertness: Awake/alert Behavior During Therapy: WFL for tasks assessed/performed Overall Cognitive Status: No family/caregiver present  to determine baseline cognitive functioning Area of  Impairment: Orientation;Following commands;Safety/judgement;Problem solving                 Orientation Level: Disoriented to;Time;Situation     Following Commands: Follows one step commands consistently;Follows multi-step commands inconsistently Safety/Judgement: Decreased awareness of safety   Problem Solving: Slow processing;Requires verbal cues General Comments: Pt incontinent of urine in bed and gown was soaked, however pt unaware.    General Comments       Exercises     Shoulder Instructions      Home Living Family/patient expects to be discharged to:: Private residence Living Arrangements: Alone Available Help at Discharge: Available 24 hours/day;Personal care attendant Type of Home: House Home Access: Stairs to enter [employer] of Steps: 7 Entrance Stairs-Rails: Right;Left;Can reach both Home Layout: One level     Bathroom Shower/Tub: Teacher, early years/pre: Standard Bathroom Accessibility: No   Home Equipment: Tub bench;Cane - single point;Walker - 2 wheels;Bedside commode   Additional Comments: Caregiver present 3 hrs/day Mon-Sat - assist with bathing and housecleaning, meals      Prior Functioning/Environment Level of Independence: Needs assistance  Gait / Transfers Assistance Needed: uses RW and sometimes w/c ADL's / Homemaking Assistance Needed: caregiver assist with dressing/bathing as needed, cooking,  dtr does grocery shopping   Comments: reports caregiver is present 24/7        OT Problem List: Decreased strength;Decreased activity tolerance;Impaired balance (sitting and/or standing);Decreased cognition;Decreased knowledge of use of DME or AE      OT Treatment/Interventions: Self-care/ADL training;DME and/or AE instruction;Patient/family education;Balance training;Therapeutic activities;Cognitive remediation/compensation    OT Goals(Current goals can be found in the care plan section) Acute Rehab OT Goals Patient  Stated Goal: return home soon OT Goal Formulation: With patient Time For Goal Achievement: 05/14/19 Potential to Achieve Goals: Good  OT Frequency: Min 2X/week   Barriers to D/C:            Co-evaluation              AM-PAC OT "6 Clicks" Daily Activity     Outcome Measure Help from another person eating meals?: None Help from another person taking care of personal grooming?: None Help from another person toileting, which includes using toliet, bedpan, or urinal?: A Little Help from another person bathing (including washing, rinsing, drying)?: A Little Help from another person to put on and taking off regular upper body clothing?: A Little Help from another person to put on and taking off regular lower body clothing?: A Lot 6 Click Score: 19   End of Session Equipment Utilized During Treatment: Gait belt;Rolling walker Nurse Communication: Mobility status  Activity Tolerance: Patient tolerated treatment well Patient left: in bed;with bed alarm set;with call bell/phone within reach  OT Visit Diagnosis: Unsteadiness on feet (R26.81);Other symptoms and signs involving cognitive function                Time: 1424-1444 OT Time Calculation (min): 20 min Charges:  OT General Charges $OT Visit: 1 Visit OT Evaluation $OT Eval Moderate Complexity: 1 Mod    Hodari, Chuba OTR/L  [place] [phone] 04/30/2019, 2:56 PM

## 2019-04-30 NOTE — H&P (Signed)
History and Physical   [name] KVQ:[id number] DOB: 1932-10-26 DOA: 04/29/2019  Referring MD/NP/PA: Dr. Rex Kras  PCP: Rutherford Guys, MD   Outpatient Specialists: None  Patient coming from: Home  Chief Complaint: Confusion  HPI: [name] is a 85 y.o. male with medical history significant of seasonal allergies, basal cell carcinoma, hyperlipidemia, hypertension, osteoporosis and eczema who was brought in by family secondary to sudden onset of worsening confusion in the last few days.  Patient has been confused with occasional combativeness.  He was seen by his primary care physician who ordered an MRI on 6/30 which was negative.  Patient continues to be confused at home and daughter brought him to the ER for evaluation.  No new medications.  No recent injuries or head injury, no fever or chills, work-up in the ER showed so far showed no evidence of UTI or pneumonia.  No seizure episodes.  Symptoms happened suddenly with no known history of dementia.  Patient is therefore being admitted to the hospital for evaluation..  ED Course: Temperature is 97.9 blood pressure 168/103 pulse 80 respiratory of 28 oxygen sat 95% room air.  CBC and CMP essentially within normal urinalysis negative ammonia level of 17 TSH 1.624.  Chest x-ray showed no active findings.  COVID-19 testing is currently pending.  Due to patient's unusual presentation is being admitted for observation and possible neurology consultation to determine cause of patient's confusion.  Review of Systems: As per HPI otherwise 10 point review of systems negative.    Past Medical History:  Diagnosis Date  . Allergy    seasonal  fall  . Cancer ([place])    Basal cell carcinoma; multiple  . Eczema   . Hyperlipidemia   . Hypertension   . Osteoporosis   . Stroke [facility])     Past Surgical History:  Procedure Laterality Date  . CATARACT EXTRACTION, BILATERAL  06/29/2015  . INTRAMEDULLARY (IM) NAIL INTERTROCHANTERIC Left 03/30/2017   Procedure: INTRAMEDULLARY (IM) NAIL INTERTROCHANTRIC;  Surgeon: [name], MD;  Location: [place];  Service: Orthopedics;  Laterality: Left;  . INTRAMEDULLARY (IM) NAIL INTERTROCHANTERIC Right 11/12/2018   Procedure: INTRAMEDULLARY (IM) NAIL RIGHT INTERTROCHANTERIC HIP FRACTURE;  Surgeon: [name], MD;  Location: [place];  Service: Orthopedics;  Laterality: Right;  . MOHS SURGERY    . TONSILLECTOMY  1940 maybe     reports that he has never smoked. He has never used smokeless tobacco. He reports that he does not drink alcohol or use drugs.  Allergies  Allergen Reactions  . Other Shortness Of Breath, Itching and Other (See Comments)    Seasonal allergies- Runny nose, congestion, itchy eyes, and wheezing    Family History  Problem Relation Age of Onset  . Arthritis Mother   . Heart disease Mother 53  . Hypertension Father      Prior to Admission medications   Medication Sig Start Date End Date Taking? Authorizing Provider  acetaminophen (TYLENOL) 500 MG tablet Take 500-1,000 mg by mouth every 6 (six) hours as needed (for headaches).   Yes [provider]  albuterol (PROVENTIL HFA;VENTOLIN HFA) 108 (90 Base) MCG/ACT inhaler Inhale 2 puffs into the lungs every 6 (six) hours as needed for wheezing or shortness of breath. 09/29/18  Yes Forrest Moron, MD  alendronate (FOSAMAX) 70 MG tablet Take 1 tablet (70 mg total) by mouth every 7 (seven) days. Take with a full glass of water on an empty stomach. Patient taking differently: Take 70 mg by mouth  every Saturday. Take with a full glass of water on an empty stomach 07/27/18  Yes Rutherford Guys, MD  beclomethasone (QVAR REDIHALER) 40 MCG/ACT inhaler Inhale 1 puff into the lungs 2 (two) times daily. 02/05/19  Yes Rutherford Guys, MD  docusate sodium (COLACE) 100 MG capsule Take 1 capsule (100 mg total) by mouth daily. Patient taking differently: Take 100 mg by mouth daily as needed for mild constipation.  03/09/19  Yes Vann,  Jessica U, DO  pravastatin (PRAVACHOL) 40 MG tablet Take 1 tablet (40 mg total) by mouth daily. 07/27/18  Yes Rutherford Guys, MD  Blood Pressure KIT Please check BP twice a day 03/25/19   Rutherford Guys, MD  metoprolol succinate (TOPROL-XL) 50 MG 24 hr tablet Take 1 tablet (50 mg total) by mouth daily. Take with or immediately following a meal. Patient taking differently: Take 50 mg by mouth daily. Take with or immediately following a meal 07/27/18   Rutherford Guys, MD  Misc. Devices [facility]) MISC Use walker whenever walking, standard walker 07/27/18   Rutherford Guys, MD  senna-docusate (SENOKOT-S) 8.6-50 MG tablet Take 1 tablet by mouth at bedtime as needed for moderate constipation. Patient not taking: Reported on 04/29/2019 03/09/19   Geradine Girt, DO    Physical Exam: Vitals:   04/29/19 2100 04/29/19 2130 04/29/19 2200 04/29/19 2230  BP: (!) 152/87 (!) 155/84 (!) 138/96 (!) 118/92  Pulse:      Resp: (!) 22 (!) 24 (!) 28 (!) 23  Temp:      TempSrc:      SpO2:          Constitutional: Fully awake, responding to questions but confused Vitals:   04/29/19 2100 04/29/19 2130 04/29/19 2200 04/29/19 2230  BP: (!) 152/87 (!) 155/84 (!) 138/96 (!) 118/92  Pulse:      Resp: (!) 22 (!) 24 (!) 28 (!) 23  Temp:      TempSrc:      SpO2:       Eyes: PERRL, lids and conjunctivae normal ENMT: Mucous membranes are moist. Posterior pharynx clear of any exudate or lesions.Normal dentition.  Neck: normal, supple, no masses, no thyromegaly Respiratory: clear to auscultation bilaterally, no wheezing, no crackles. Normal respiratory effort. No accessory muscle use.  Cardiovascular: Regular rate and rhythm, no murmurs / rubs / gallops. No extremity edema. 2+ pedal pulses. No carotid bruits.  Abdomen: no tenderness, no masses palpated. No hepatosplenomegaly. Bowel sounds positive.  Musculoskeletal: no clubbing / cyanosis. No joint deformity upper and lower extremities. Good ROM, no contractures.  Normal muscle tone.  Skin: no rashes, lesions, ulcers. No induration Neurologic: CN 2-12 grossly intact. Sensation intact, DTR normal. Strength 5/5 in all 4.  Psychiatric: Patient is fully alert and awake, he is however confused not sure where he is at.    Labs on Admission: I have personally reviewed following labs and imaging studies  CBC: Recent Labs  Lab 04/27/19 1607 04/29/19 1509  WBC 9.2 11.0*  HGB 15.3* 17.7*  HCT 44.2* 52.8*  MCV 92.9 94.0  PLT  --  527   Basic Metabolic Panel: Recent Labs  Lab 04/27/19 1627 04/29/19 1509  NA 131* 131*  K 4.5 4.7  CL 94* 97*  CO2 20 24  GLUCOSE 101* 103*  BUN 11 13  CREATININE 0.92 1.00  CALCIUM 9.5 9.0   GFR: Estimated Creatinine Clearance: 47 mL/min (by C-G formula based on SCr of 1 mg/dL). Liver Function Tests: Recent  Labs  Lab 04/29/19 1644  AST 23  ALT 13  ALKPHOS 78  BILITOT 0.8  PROT 6.7  ALBUMIN 3.6   No results for input(s): LIPASE, AMYLASE in the last 168 hours. Recent Labs  Lab 04/29/19 1715  AMMONIA 17   Coagulation Profile: No results for input(s): INR, PROTIME in the last 168 hours. Cardiac Enzymes: No results for input(s): CKTOTAL, CKMB, CKMBINDEX, TROPONINI in the last 168 hours. BNP (last 3 results) No results for input(s): PROBNP in the last 8760 hours. HbA1C: No results for input(s): HGBA1C in the last 72 hours. CBG: No results for input(s): GLUCAP in the last 168 hours. Lipid Profile: No results for input(s): CHOL, HDL, LDLCALC, TRIG, CHOLHDL, LDLDIRECT in the last 72 hours. Thyroid Function Tests: Recent Labs    04/29/19 1700  TSH 1.624   Anemia Panel: Recent Labs    04/27/19 1627  VITAMINB12 418   Urine analysis:    Component Value Date/Time   COLORURINE YELLOW 04/29/2019 2045   APPEARANCEUR CLEAR 04/29/2019 2045   LABSPEC 1.011 04/29/2019 2045   PHURINE 7.0 04/29/2019 2045   GLUCOSEU NEGATIVE 04/29/2019 2045   HGBUR NEGATIVE 04/29/2019 2045   BILIRUBINUR NEGATIVE  04/29/2019 2045   BILIRUBINUR negative 03/23/2019 1420   BILIRUBINUR neg 06/03/2014 1014   KETONESUR NEGATIVE 04/29/2019 2045   PROTEINUR NEGATIVE 04/29/2019 2045   UROBILINOGEN 1.0 03/23/2019 1420   NITRITE NEGATIVE 04/29/2019 2045   LEUKOCYTESUR NEGATIVE 04/29/2019 2045   Sepsis Labs: '@LABRCNTIP' (procalcitonin:4,lacticidven:4) )No results found for this or any previous visit (from the past 240 hour(s)).   Radiological Exams on Admission: Dg Chest 2 View  Result Date: 04/29/2019 CLINICAL DATA:  85 year old male with atrial fibrillation. EXAM: CHEST - 2 VIEW COMPARISON:  01/13/2019 and earlier. FINDINGS: Upright AP and lateral views of the chest. Chronic elevation of the right hemidiaphragm. Stable lung volumes. Chronic hiatal hernia. No cardiomegaly. Other mediastinal contours are within normal limits. Chronic rightward deviation of the trachea at the thoracic inlet. No pneumothorax, pulmonary edema, pleural effusion or acute pulmonary opacity. Negative visible bowel gas pattern. Osteopenia. No acute osseous abnormality identified. Abdominal Calcified aortic atherosclerosis. IMPRESSION: 1. No acute cardiopulmonary abnormality. 2. Chronic hiatal hernia and elevation of the right hemidiaphragm. 3. Chronic left thyroid goiter suspected. Electronically Signed   By: Genevie Ann M.D.   On: 04/29/2019 15:44    EKG: Independently reviewed.  It shows normal sinus rhythm with a rate of 77, normal intervals.  Frequent PVCs otherwise no significant ST changes.  Assessment/Plan Principal Problem:   Altered mental status Active Problems:   Dyslipidemia   Chronic renal insufficiency   Essential hypertension   History of CVA (cerebrovascular accident)   Impaired mobility     #1 altered mental status: Cause is not clear.  Could be secondary to new onset of dementia.  Symptoms were sudden but still could just be the tipping point.  No obvious evidence of seizure.  Patient will be admitted for observation.   We will try and obtain neurology consultation to help with interpretation.  Resume home regimen as necessary.  Normal MRI.  #2 hypertension: Resume home regimen of blood pressure medications.  #3 hyperlipidemia: Continue with home regimen.  #4 history of CVA: MRI from 2 days ago is negative.  #5 impaired mobility: May need to get PT and OT consult prior to discharge.  #6 hyponatremia: This is mild.  Also chronic.  I doubt it has anything to do with his symptoms.   DVT prophylaxis: Lovenox  Code Status: DNR Family Communication: Daughter at bedside Disposition Plan: To be determined Consults called: None.  Please get neurology consult in the morning if needed Admission status: Observation  Severity of Illness: The appropriate patient status for this patient is OBSERVATION. Observation status is judged to be reasonable and necessary in order to provide the required intensity of service to ensure the patient's safety. The patient's presenting symptoms, physical exam findings, and initial radiographic and laboratory data in the context of their medical condition is felt to place them at decreased risk for further clinical deterioration. Furthermore, it is anticipated that the patient will be medically stable for discharge from the hospital within 2 midnights of admission. The following factors support the patient status of observation.   " The patient's presenting symptoms include confusion. " The physical exam findings include mainly confusion. " The initial radiographic and laboratory data are negative.     Barbette Merino MD [facility] Pager [phone]  If 7PM-7AM, please contact night-coverage [email or web address] Password [email or web address]  04/30/2019, 12:20 AM

## 2019-04-30 NOTE — Progress Notes (Signed)
Pts. Daughter will come and sit with the pt tonight. With MD approval.

## 2019-04-30 NOTE — Progress Notes (Signed)
EEG complete - results pending 

## 2019-04-30 NOTE — Progress Notes (Signed)
Patient refused PIV at this time. Nurse notified. Fran Lowes, RN VAST

## 2019-04-30 NOTE — Progress Notes (Signed)
Tried to put PIV 2x , but pt still refused. No PIV at this time. MD aware.

## 2019-04-30 NOTE — Progress Notes (Signed)
Patient admitted after midnight, please see H&P.  Here is abrupt mental status changes.  Was at the beach in early June.  No issues there but since has had worsening mentation and behaviors.  Lives alone but has 24/7 care giving and he has not been sleeping per them.  He is also more impulsive per family.  Spoke with daughter for > 15 minutes on phone. EEG negative MRI as outpatient negative No sign of infection R/o fecal obstruction and urinary retention.    Wesley Bear DO

## 2019-04-30 NOTE — Plan of Care (Signed)
  Problem: Clinical Measurements: Goal: Will remain free from infection Outcome: Progressing   Problem: Activity: Goal: Risk for activity intolerance will decrease Outcome: Progressing   

## 2019-05-01 ENCOUNTER — Other Ambulatory Visit (HOSPITAL_COMMUNITY): Payer: Medicare Other

## 2019-05-01 MED ORDER — ADULT MULTIVITAMIN W/MINERALS CH
1.0000 | ORAL_TABLET | Freq: Every day | ORAL | Status: DC
  Administered 2019-05-01 – 2019-05-04 (×4): 1 via ORAL
  Filled 2019-05-01 (×4): qty 1

## 2019-05-01 MED ORDER — FOLIC ACID 1 MG PO TABS
1.0000 mg | ORAL_TABLET | Freq: Every day | ORAL | Status: DC
  Administered 2019-05-01 – 2019-05-04 (×4): 1 mg via ORAL
  Filled 2019-05-01 (×4): qty 1

## 2019-05-01 MED ORDER — OLANZAPINE 5 MG PO TBDP
5.0000 mg | ORAL_TABLET | Freq: Every day | ORAL | Status: DC | PRN
  Administered 2019-05-01: 5 mg via ORAL
  Filled 2019-05-01 (×2): qty 1

## 2019-05-01 MED ORDER — VITAMIN B-1 100 MG PO TABS
100.0000 mg | ORAL_TABLET | Freq: Every day | ORAL | Status: DC
  Administered 2019-05-01 – 2019-05-04 (×4): 100 mg via ORAL
  Filled 2019-05-01 (×4): qty 1

## 2019-05-01 MED ORDER — THIAMINE HCL 100 MG/ML IJ SOLN: 100.0000 mg | Freq: Every day | INTRAMUSCULAR | Status: DC

## 2019-05-01 MED ORDER — LORAZEPAM 1 MG PO TABS
1.0000 mg | ORAL_TABLET | Freq: Four times a day (QID) | ORAL | Status: DC | PRN
  Administered 2019-05-03: 1 mg via ORAL
  Filled 2019-05-01: qty 1

## 2019-05-01 MED ORDER — LORAZEPAM 2 MG/ML IJ SOLN: 1.0000 mg | Freq: Four times a day (QID) | INTRAMUSCULAR | Status: DC | PRN

## 2019-05-01 MED ORDER — AMLODIPINE BESYLATE 2.5 MG PO TABS
5.0000 mg | ORAL_TABLET | Freq: Every day | ORAL | Status: DC
  Administered 2019-05-01 – 2019-05-04 (×4): 5 mg via ORAL
  Filled 2019-05-01 (×4): qty 2

## 2019-05-01 NOTE — Progress Notes (Signed)
PT. Unsteady. Unable to obtain standing weight.

## 2019-05-01 NOTE — Progress Notes (Signed)
Progress Note    [name]  ERX:[id number] DOB: May 11, 1932  DOA: 04/29/2019 PCP: Rutherford Guys, MD    Brief Narrative:    Medical records reviewed and are as summarized below:  [name] is an 85 y.o. male with medical history significant of seasonal allergies, basal cell carcinoma, hyperlipidemia, hypertension, osteoporosis and eczema who was brought in by family secondary to sudden onset of worsening confusion in the last few days.  Patient has been confused with occasional combativeness.  He was seen by his primary care physician who ordered an MRI on 6/30 which was negative.  Patient continues to be confused at home and daughter brought him to the ER for evaluation.  No new medications.  Work up thus far is unrevealing.  Was at the beach on 6/6-6/13-- had some tequila (not much) and when he came home he was c/o headaches and then became delirious.    Assessment/Plan:   Principal Problem:   Altered mental status Active Problems:   Dyslipidemia   Chronic renal insufficiency   Essential hypertension   History of CVA (cerebrovascular accident)   Impaired mobility   Confusion  Altered mental status/delitium: Cause is not clear.  Could be secondary to worsening dementia.  Symptoms were sudden but still could just be the tipping point.  No obvious evidence of seizure- EEG negative.   Labs unremarkable -no sign of infection -no bladder obstruction -no fecal impaction -still not back to baseline -drinks occasional alcohol-- more in the last 2 week-- slim chance of this being withdrawal but will start CIWA -added low dose trazodone -PRN zyprexa if gets combative (will need to watch Qtc)  hypertension:  -Resume home regimen of blood pressure medications with additional agent norvasc  hyperlipidemia:  -Continue with home regimen.  history of TIA/CVA:  -MRI from 2 days ago is negative.  impaired mobility:  -PT/OT consult  hyponatremia: This is mild.  Also chronic.   -better with re-hydration   Family Communication/Anticipated D/C date and plan/Code Status   DVT prophylaxis: Lovenox ordered. Code Status: Full Code.  Family Communication: spoke with daughter-- > 30 min Disposition Plan: pending improvement   Medical Consultants:    None.   Subjective:   Confused this AM-- thinks he is waiting for his mother  Objective:    Vitals:   05/01/19 0740 05/01/19 0827 05/01/19 1132 05/01/19 1134  BP: (!) 155/92  (!) 171/84 (!) 149/83  Pulse: 82 71 63 62  Resp: 20 18 18    Temp: 97.7 F (36.5 C)  (!) 97.4 F (36.3 C)   TempSrc: Oral  Oral   SpO2: 99% 98% 99%   Weight:        Intake/Output Summary (Last 24 hours) at 05/01/2019 1434 Last data filed at 05/01/2019 1224 Gross per 24 hour  Intake 897 ml  Output 2325 ml  Net -1428 ml   Filed Weights   05/01/19 0450  Weight: 65 kg    Exam: In bed, confused Rrr, not bradycardic No increased work of breathing   Data Reviewed:   I have personally reviewed following labs and imaging studies:  Labs: Labs show the following:   Basic Metabolic Panel: Recent Labs  Lab 04/27/19 1627 04/29/19 1509 04/30/19 0308  NA 131* 131* 133*  K 4.5 4.7 3.9  CL 94* 97* 95*  CO2 20 24 28   GLUCOSE 101* 103* 100*  BUN 11 13 10   CREATININE 0.92 1.00 1.05  CALCIUM 9.5 9.0 9.0   GFR Estimated  Creatinine Clearance: 47.3 mL/min (by C-G formula based on SCr of 1.05 mg/dL). Liver Function Tests: Recent Labs  Lab 04/29/19 1644 04/30/19 0308  AST 23 20  ALT 13 13  ALKPHOS 78 76  BILITOT 0.8 1.1  PROT 6.7 6.7  ALBUMIN 3.6 3.5   No results for input(s): LIPASE, AMYLASE in the last 168 hours. Recent Labs  Lab 04/29/19 1715  AMMONIA 17   Coagulation profile No results for input(s): INR, PROTIME in the last 168 hours.  CBC: Recent Labs  Lab 04/27/19 1607 04/29/19 1509 04/30/19 0308  WBC 9.2 11.0* 8.5  HGB 15.3* 17.7* 14.9  HCT 44.2* 52.8* 42.7  MCV 92.9 94.0 91.2  PLT  --  251 216    Cardiac Enzymes: No results for input(s): CKTOTAL, CKMB, CKMBINDEX, TROPONINI in the last 168 hours. BNP (last 3 results) No results for input(s): PROBNP in the last 8760 hours. CBG: No results for input(s): GLUCAP in the last 168 hours. D-Dimer: No results for input(s): DDIMER in the last 72 hours. Hgb A1c: No results for input(s): HGBA1C in the last 72 hours. Lipid Profile: No results for input(s): CHOL, HDL, LDLCALC, TRIG, CHOLHDL, LDLDIRECT in the last 72 hours. Thyroid function studies: Recent Labs    04/29/19 1700  TSH 1.624   Anemia work up: No results for input(s): VITAMINB12, FOLATE, FERRITIN, TIBC, IRON, RETICCTPCT in the last 72 hours. Sepsis Labs: Recent Labs  Lab 04/27/19 1607 04/29/19 1509 04/30/19 0308  WBC 9.2 11.0* 8.5    Microbiology Recent Results (from the past 240 hour(s))  SARS Coronavirus 2 (CEPHEID - Performed in [place] hospital lab), Hosp Order     Status: None   Collection Time: 04/29/19 11:15 PM   Specimen: Nasopharyngeal Swab  Result Value Ref Range Status   SARS Coronavirus 2 NEGATIVE NEGATIVE Final    Comment: (NOTE) If result is NEGATIVE SARS-CoV-2 target nucleic acids are NOT DETECTED. The SARS-CoV-2 RNA is generally detectable in upper and lower  respiratory specimens during the acute phase of infection. The lowest  concentration of SARS-CoV-2 viral copies this assay can detect is 250  copies / mL. A negative result does not preclude SARS-CoV-2 infection  and should not be used as the sole basis for treatment or other  patient management decisions.  A negative result may occur with  improper specimen collection / handling, submission of specimen other  than nasopharyngeal swab, presence of viral mutation(s) within the  areas targeted by this assay, and inadequate number of viral copies  (<250 copies / mL). A negative result must be combined with clinical  observations, patient history, and epidemiological information. If  result is POSITIVE SARS-CoV-2 target nucleic acids are DETECTED. The SARS-CoV-2 RNA is generally detectable in upper and lower  respiratory specimens dur ing the acute phase of infection.  Positive  results are indicative of active infection with SARS-CoV-2.  Clinical  correlation with patient history and other diagnostic information is  necessary to determine patient infection status.  Positive results do  not rule out bacterial infection or co-infection with other viruses. If result is PRESUMPTIVE POSTIVE SARS-CoV-2 nucleic acids MAY BE PRESENT.   A presumptive positive result was obtained on the submitted specimen  and confirmed on repeat testing.  While 2019 novel coronavirus  (SARS-CoV-2) nucleic acids may be present in the submitted sample  additional confirmatory testing may be necessary for epidemiological  and / or clinical management purposes  to differentiate between  SARS-CoV-2 and other Sarbecovirus currently known  to infect humans.  If clinically indicated additional testing with an alternate test  methodology [phone]) is advised. The SARS-CoV-2 RNA is generally  detectable in upper and lower respiratory sp ecimens during the acute  phase of infection. The expected result is Negative. Fact Sheet for Patients:  StrictlyIdeas.no Fact Sheet for Healthcare Providers: BankingDealers.co.za This test is not yet approved or cleared by the [place] FDA and has been authorized for detection and/or diagnosis of SARS-CoV-2 by FDA under an Emergency Use Authorization (EUA).  This EUA will remain in effect (meaning this test can be used) for the duration of the COVID-19 declaration under Section 564(b)(1) of the Act, 21 U.S.C. section 360bbb-3(b)(1), unless the authorization is terminated or revoked sooner. Performed at [facility], [place] [address] [place], [place] [address]     Procedures and diagnostic studies:  Dg  Chest 2 View  Result Date: 04/29/2019 CLINICAL DATA:  85 year old male with atrial fibrillation. EXAM: CHEST - 2 VIEW COMPARISON:  01/13/2019 and earlier. FINDINGS: Upright AP and lateral views of the chest. Chronic elevation of the right hemidiaphragm. Stable lung volumes. Chronic hiatal hernia. No cardiomegaly. Other mediastinal contours are within normal limits. Chronic rightward deviation of the trachea at the thoracic inlet. No pneumothorax, pulmonary edema, pleural effusion or acute pulmonary opacity. Negative visible bowel gas pattern. Osteopenia. No acute osseous abnormality identified. Abdominal Calcified aortic atherosclerosis. IMPRESSION: 1. No acute cardiopulmonary abnormality. 2. Chronic hiatal hernia and elevation of the right hemidiaphragm. 3. Chronic left thyroid goiter suspected. Electronically Signed   By: Genevie Ann M.D.   On: 04/29/2019 15:44    Medications:   . budesonide  2 mL Inhalation BID  . enoxaparin (LOVENOX) injection  40 mg Subcutaneous Q24H  . folic acid  1 mg Oral Daily  . metoprolol succinate  50 mg Oral Daily  . multivitamin with minerals  1 tablet Oral Daily  . pravastatin  40 mg Oral Daily  . sodium chloride flush  3 mL Intravenous Once  . thiamine  100 mg Oral Daily   Or  . thiamine  100 mg Intravenous Daily  . traZODone  25 mg Oral QHS   Continuous Infusions:   LOS: 1 day   Geradine Girt  [facility]   How to contact the [facility] Attending or Consulting provider [place] or covering provider during after hours [place], for this patient?  1. Check the care team in [facility] and look for a) attending/consulting [facility] provider listed and b) the [facility] team listed 2. Log into [email or web address] and use [place]'s universal password to access. If you do not have the password, please contact the hospital operator. 3. Locate the [facility] provider you are looking for under [facility] and page to a number that you can be directly reached. 4. If you still have difficulty  reaching the provider, please page the [facility] (Director on Call) for the Hospitalists listed on amion for assistance.  05/01/2019, 2:34 PM

## 2019-05-01 NOTE — Progress Notes (Signed)
Pt doesn't have a documented pulmonary hx.  Per his home meds list pt takes Qvar 1 puff daily but again there is no documented pulmonary hx.

## 2019-05-02 ENCOUNTER — Inpatient Hospital Stay (HOSPITAL_COMMUNITY): Payer: Medicare Other

## 2019-05-02 DIAGNOSIS — G459 Transient cerebral ischemic attack, unspecified: Secondary | ICD-10-CM

## 2019-05-02 DIAGNOSIS — R441 Visual hallucinations: Secondary | ICD-10-CM

## 2019-05-02 DIAGNOSIS — E785 Hyperlipidemia, unspecified: Secondary | ICD-10-CM

## 2019-05-02 LAB — ECHOCARDIOGRAM COMPLETE: Weight: 2246.93 oz

## 2019-05-02 LAB — GLUCOSE, CAPILLARY
Glucose-Capillary: 92 mg/dL (ref 70–99)
Glucose-Capillary: 93 mg/dL (ref 70–99)
Glucose-Capillary: 90 mg/dL (ref 70–99)

## 2019-05-02 MED ORDER — OLANZAPINE 5 MG PO TBDP
2.5000 mg | ORAL_TABLET | Freq: Every evening | ORAL | Status: DC | PRN
  Filled 2019-05-02: qty 0.5

## 2019-05-02 MED ORDER — OLANZAPINE 5 MG PO TBDP
2.5000 mg | ORAL_TABLET | Freq: Every day | ORAL | Status: DC
  Filled 2019-05-02: qty 0.5

## 2019-05-02 MED ORDER — TRAZODONE HCL 50 MG PO TABS
25.0000 mg | ORAL_TABLET | Freq: Every day | ORAL | Status: DC
  Administered 2019-05-02: 25 mg via ORAL
  Filled 2019-05-02: qty 1

## 2019-05-02 MED ORDER — CLOPIDOGREL BISULFATE 75 MG PO TABS
75.0000 mg | ORAL_TABLET | Freq: Every day | ORAL | Status: DC
  Administered 2019-05-02 – 2019-05-04 (×3): 75 mg via ORAL
  Filled 2019-05-02 (×3): qty 1

## 2019-05-02 MED ORDER — CAMPHOR-MENTHOL 0.5-0.5 % EX LOTN
TOPICAL_LOTION | CUTANEOUS | Status: DC | PRN
  Filled 2019-05-02: qty 222

## 2019-05-02 NOTE — Progress Notes (Deleted)
HR remains from 100-130 bpm at rest per now. MD aware. No new orders at this time.

## 2019-05-02 NOTE — Progress Notes (Signed)
Contacted [name] case menager for [facility] needs.

## 2019-05-02 NOTE — Progress Notes (Signed)
Called and provided update to daughter, [name].  Informed daughter that patient's oxygen saturations would be monitored tonight per order by Dr. Eliseo Squires, DO.

## 2019-05-02 NOTE — Progress Notes (Signed)
Provided patient with continuous pulse ox for night time at bedside. Will inform night shift RN to keep an eye and document oxygen saturation during the night/sleep.

## 2019-05-02 NOTE — Plan of Care (Signed)
Progressing

## 2019-05-02 NOTE — Progress Notes (Signed)
Caregiver at bedside per MD's request.

## 2019-05-02 NOTE — Progress Notes (Addendum)
Progress Note    [name]  NGE:[id number] DOB: 1932-03-12  DOA: 04/29/2019 PCP: Rutherford Guys, MD    Brief Narrative:    Medical records reviewed and are as summarized below:  [name] is an 85 y.o. male with medical history significant of seasonal allergies, basal cell carcinoma, hyperlipidemia, hypertension, osteoporosis and eczema who was brought in by family secondary to sudden onset of worsening confusion in the last few days.  Patient has been confused with occasional combativeness.  He was seen by his primary care physician who ordered an MRI on 6/30 which was negative.  Patient continues to be confused at home and daughter brought him to the ER for evaluation.  No new medications.  Work up thus far is unrevealing.  Was at the beach on [place]6-6[place]13-- had some tequila (not much) and when he came home he was c/o headaches and then became delirious.    Assessment/Plan:   Principal Problem:   Altered mental status Active Problems:   Dyslipidemia   Chronic renal insufficiency   Essential hypertension   History of CVA (cerebrovascular accident)   Impaired mobility   Confusion  Altered mental status/delitium: Cause is not clear.  Could be secondary to worsening dementia.  Symptoms were sudden but still could just be the tipping point.  No obvious evidence of seizure- EEG negative.   Labs unremarkable -no sign of infection -no bladder obstruction -no fecal impaction -still not back to baseline -drinks occasional alcohol-- more in the last 2 week-- slim chance of this being withdrawal but will start CIWA -added low dose trazodone for sleep aid -PRN zyprexa if gets combative (will need to watch Qtc) ? OSA-- will do overnight pulse ox -history from daughter and care giver mixed-- daughter says no dementia but he does have issues with short term  Memory-- daughter says mentation changed suddenly--- caregiver says he has had slow change over last 1 month with sleep issues   hypertension:  -Resume home regimen of blood pressure medications with additional agent norvasc  hyperlipidemia:  -Continue with home regimen.  history of TIA/CVA:  -MRI from prior to admission -patient should be on plavix (when sent home from last hospitalization it was on ASA/plavix x 3 weeks then plavix alone)  impaired mobility:  -PT/OT consult  hyponatremia: This is mild.  Also chronic.  -better with re-hydration   Family Communication/Anticipated D/C date and plan/Code Status   DVT prophylaxis: Lovenox ordered. Code Status: dnr Family Communication: spoke with daughter-- > 30 min Disposition Plan: home in AM   Medical Consultants:    None.   Subjective:   Much more with it this AM  Objective:    Vitals:   05/02/19 0609 05/02/19 0911 05/02/19 0957 05/02/19 1209  BP: (!) 114/49  (!) 101/55 131/68  Pulse: 66 71 60 (!) 54  Resp: 18 18  20   Temp: 97.7 F (36.5 C)   97.7 F (36.5 C)  TempSrc: Oral   Oral  SpO2: 96% 91%  96%  Weight: 63.7 kg       Intake/Output Summary (Last 24 hours) at 05/02/2019 1404 Last data filed at 05/02/2019 1100 Gross per 24 hour  Intake 895.92 ml  Output 1400 ml  Net -504.08 ml   Filed Weights   05/01/19 0450 05/02/19 0609  Weight: 65 kg 63.7 kg    Exam: In bed, oriented to person/place and time this AM rrr Mild increase in work of breathing  On re-eval periods of apnea while  napping this PM ? OSA-- overnight pulse ox ordered  Data Reviewed:   I have personally reviewed following labs and imaging studies:  Labs: Labs show the following:   Basic Metabolic Panel: Recent Labs  Lab 04/27/19 1627 04/29/19 1509 04/30/19 0308  NA 131* 131* 133*  K 4.5 4.7 3.9  CL 94* 97* 95*  CO2 20 24 28   GLUCOSE 101* 103* 100*  BUN 11 13 10   CREATININE 0.92 1.00 1.05  CALCIUM 9.5 9.0 9.0   GFR Estimated Creatinine Clearance: 46.3 mL/min (by C-G formula based on SCr of 1.05 mg/dL). Liver Function Tests: Recent Labs   Lab 04/29/19 1644 04/30/19 0308  AST 23 20  ALT 13 13  ALKPHOS 78 76  BILITOT 0.8 1.1  PROT 6.7 6.7  ALBUMIN 3.6 3.5   No results for input(s): LIPASE, AMYLASE in the last 168 hours. Recent Labs  Lab 04/29/19 1715  AMMONIA 17   Coagulation profile No results for input(s): INR, PROTIME in the last 168 hours.  CBC: Recent Labs  Lab 04/27/19 1607 04/29/19 1509 04/30/19 0308  WBC 9.2 11.0* 8.5  HGB 15.3* 17.7* 14.9  HCT 44.2* 52.8* 42.7  MCV 92.9 94.0 91.2  PLT  --  251 216   Cardiac Enzymes: No results for input(s): CKTOTAL, CKMB, CKMBINDEX, TROPONINI in the last 168 hours. BNP (last 3 results) No results for input(s): PROBNP in the last 8760 hours. CBG: Recent Labs  Lab 05/02/19 0654  GLUCAP 92   D-Dimer: No results for input(s): DDIMER in the last 72 hours. Hgb A1c: No results for input(s): HGBA1C in the last 72 hours. Lipid Profile: No results for input(s): CHOL, HDL, LDLCALC, TRIG, CHOLHDL, LDLDIRECT in the last 72 hours. Thyroid function studies: Recent Labs    04/29/19 1700  TSH 1.624   Anemia work up: No results for input(s): VITAMINB12, FOLATE, FERRITIN, TIBC, IRON, RETICCTPCT in the last 72 hours. Sepsis Labs: Recent Labs  Lab 04/27/19 1607 04/29/19 1509 04/30/19 0308  WBC 9.2 11.0* 8.5    Microbiology Recent Results (from the past 240 hour(s))  SARS Coronavirus 2 (CEPHEID - Performed in [facility] hospital lab), Hosp Order     Status: None   Collection Time: 04/29/19 11:15 PM   Specimen: Nasopharyngeal Swab  Result Value Ref Range Status   SARS Coronavirus 2 NEGATIVE NEGATIVE Final    Comment: (NOTE) If result is NEGATIVE SARS-CoV-2 target nucleic acids are NOT DETECTED. The SARS-CoV-2 RNA is generally detectable in upper and lower  respiratory specimens during the acute phase of infection. The lowest  concentration of SARS-CoV-2 viral copies this assay can detect is 250  copies / mL. A negative result does not preclude SARS-CoV-2  infection  and should not be used as the sole basis for treatment or other  patient management decisions.  A negative result may occur with  improper specimen collection / handling, submission of specimen other  than nasopharyngeal swab, presence of viral mutation(s) within the  areas targeted by this assay, and inadequate number of viral copies  (<250 copies / mL). A negative result must be combined with clinical  observations, patient history, and epidemiological information. If result is POSITIVE SARS-CoV-2 target nucleic acids are DETECTED. The SARS-CoV-2 RNA is generally detectable in upper and lower  respiratory specimens dur ing the acute phase of infection.  Positive  results are indicative of active infection with SARS-CoV-2.  Clinical  correlation with patient history and other diagnostic information is  necessary to determine patient  infection status.  Positive results do  not rule out bacterial infection or co-infection with other viruses. If result is PRESUMPTIVE POSTIVE SARS-CoV-2 nucleic acids MAY BE PRESENT.   A presumptive positive result was obtained on the submitted specimen  and confirmed on repeat testing.  While 2019 novel coronavirus  (SARS-CoV-2) nucleic acids may be present in the submitted sample  additional confirmatory testing may be necessary for epidemiological  and / or clinical management purposes  to differentiate between  SARS-CoV-2 and other Sarbecovirus currently known to infect humans.  If clinically indicated additional testing with an alternate test  methodology [phone]) is advised. The SARS-CoV-2 RNA is generally  detectable in upper and lower respiratory sp ecimens during the acute  phase of infection. The expected result is Negative. Fact Sheet for Patients:  StrictlyIdeas.no Fact Sheet for Healthcare Providers: BankingDealers.co.za This test is not yet approved or cleared by the [place]  FDA and has been authorized for detection and/or diagnosis of SARS-CoV-2 by FDA under an Emergency Use Authorization (EUA).  This EUA will remain in effect (meaning this test can be used) for the duration of the COVID-19 declaration under Section 564(b)(1) of the Act, 21 U.S.C. section 360bbb-3(b)(1), unless the authorization is terminated or revoked sooner. Performed at [facility], [place] [address] [place], [place] [address]     Procedures and diagnostic studies:  No results found.  Medications:   . amLODipine  5 mg Oral Daily  . enoxaparin (LOVENOX) injection  40 mg Subcutaneous Q24H  . folic acid  1 mg Oral Daily  . metoprolol succinate  50 mg Oral Daily  . multivitamin with minerals  1 tablet Oral Daily  . OLANZapine zydis  2.5 mg Oral QHS  . pravastatin  40 mg Oral Daily  . sodium chloride flush  3 mL Intravenous Once  . thiamine  100 mg Oral Daily   Or  . thiamine  100 mg Intravenous Daily  . traZODone  25 mg Oral QHS   Continuous Infusions:   LOS: 2 days   Geradine Girt  [facility]   How to contact the [facility] Attending or Consulting provider [place] or covering provider during after hours [place], for this patient?  1. Check the care team in [facility] and look for a) attending/consulting TRH provider listed and b) the [facility] team listed 2. Log into [email or web address] and use [place]'s universal password to access. If you do not have the password, please contact the hospital operator. 3. Locate the [facility] provider you are looking for under [facility] and page to a number that you can be directly reached. 4. If you still have difficulty reaching the provider, please page the [facility] (Director on Call) for the Hospitalists listed on amion for assistance.  05/02/2019, 2:04 PM

## 2019-05-02 NOTE — Progress Notes (Signed)
Pt alert and oriented x4 during bedside report at the beginning of the shift and during bedtime med pass. Continuous pulse ox applied to patient. Pt has become confused before midnight tonight, pulling at the wires of the continuous pulse ox. O2 sat has remained above 92% on room air so far. Pt is disoriented to place and situation. Will continue to monitor.

## 2019-05-02 NOTE — Progress Notes (Signed)
Pt continues to refuse Pulmicort. No distress noted.  bbs clear to auscultation

## 2019-05-02 NOTE — Progress Notes (Signed)
  Echocardiogram 2D Echocardiogram has been performed.  [name] 05/02/2019, 12:06 PM

## 2019-05-02 NOTE — TOC Transition Note (Signed)
Transition of Care [facility]) - CM/SW Discharge Note   Patient Details  Name: [name] MRN: [id number] Date of Birth: Oct 09, 1932  [facility] CM/SW Contact:  Bartholomew Crews, RN Phone Number: [phone] 05/02/2019, 3:16 PM   Clinical Narrative:    Received message that patient needed [facility] PT. Noted that patient is active with [facility] for [facility] RN. Requested that RN be added to [facility] order for resumption of services. [facility] liaison advised by vm of patient transition home. No further transition of care needs identified at this time.    Final next level of care: [place] Barriers to Discharge: No Barriers Identified   Patient Goals and CMS Choice        Discharge Placement                       Discharge Plan and Services                DME Arranged: N/A DME Agency: NA       HH Arranged: RN, PT HH Agency: [facility] Date [place]: 05/02/19 Time [place]: 2536 Representative spoke with at [place]: left message for [employer] of Health (SDOH) Interventions     Readmission Risk Interventions No flowsheet data found.

## 2019-05-03 MED ORDER — LORAZEPAM 2 MG/ML IJ SOLN
1.0000 mg | Freq: Once | INTRAMUSCULAR | Status: DC
  Filled 2019-05-03: qty 1

## 2019-05-03 MED ORDER — TRAZODONE HCL 50 MG PO TABS
50.0000 mg | ORAL_TABLET | Freq: Every day | ORAL | Status: DC
  Administered 2019-05-03: 50 mg via ORAL
  Filled 2019-05-03: qty 1

## 2019-05-03 MED ORDER — TRAZODONE HCL 50 MG PO TABS: 50.0000 mg | ORAL_TABLET | Freq: Every day | ORAL | Status: DC

## 2019-05-03 MED ORDER — OLANZAPINE 5 MG PO TBDP
2.5000 mg | ORAL_TABLET | Freq: Every day | ORAL | Status: DC
  Filled 2019-05-03: qty 0.5

## 2019-05-03 MED ORDER — OLANZAPINE 5 MG PO TBDP
2.5000 mg | ORAL_TABLET | Freq: Every evening | ORAL | Status: DC | PRN
  Filled 2019-05-03: qty 0.5

## 2019-05-03 NOTE — Progress Notes (Signed)
Pt's O2 sat dropped to 88% very briefly while pt sleeping around 03:15. PO Ativan was given at 02:07. O2 sat quickly increased and has remained atleast 93% for nearly the whole shift.

## 2019-05-03 NOTE — Progress Notes (Signed)
Physical Therapy Treatment Patient Details Name: [name] MRN: [id number] DOB: 1931/11/24 Today's Date: 05/03/2019    History of Present Illness Patient is a 85 y/o male presenting to the ED on 04/29/2019 with confusion. Past medical history significant of seasonal allergies, basal cell carcinoma, hyperlipidemia, hypertension, osteoporosis and eczema. Admitted for AMS with continued work-up.     PT Comments    Pt supine, confused with increased activity tolerance this session. Pt with increased gait tolerance but despite max cues for self regulation of activity pt unable to determine fatigue and requires mod assist with fatigue to maintain balance and gait. Pt limited by impaired safety awareness, incontinence and function. Will continue to follow.  96-97% RA HR 79-88 with activity   Follow Up Recommendations  Home health PT;Supervision/Assistance - 24 hour;SNF(SNF if 24hr assist not available)     Equipment Recommendations  None recommended by PT    Recommendations for Other Services       Precautions / Restrictions Precautions Precautions: Fall    Mobility  Bed Mobility Overal bed mobility: Needs Assistance Bed Mobility: Supine to Sit     Supine to sit: Min guard;HOB elevated     General bed mobility comments: HOb 25 degrees with increased time and guarding for safety  Transfers Overall transfer level: Needs assistance   Transfers: Sit to/from Stand Sit to Stand: Min assist         General transfer comment: assist to rise with cues for hand placement x 3 trials from bed and chair  Ambulation/Gait Ambulation/Gait assistance: Min assist;Mod assist Gait Distance (Feet): 225 Feet Assistive device: Rolling walker (2 wheeled) Gait Pattern/deviations: Step-through pattern;Decreased stride length;Trunk flexed   Gait velocity interpretation: 1.31 - 2.62 ft/sec, indicative of limited community ambulator General Gait Details: pt with good stability with RW with cues  for posture and position in RW with assist to direct RW around obstacles. Cues for activity modification and self regulation however cues pt to change direction to return to room. Last 100' pt with noted fatigue, declined chair with increased lean to right and increased physical assist from min to mod assist for last 5' of gait with Rw   Stairs             Wheelchair Mobility    Modified Rankin (Stroke Patients Only)       Balance Overall balance assessment: Needs assistance Sitting-balance support: No upper extremity supported;Feet supported Sitting balance-Leahy Scale: Good     Standing balance support: Bilateral upper extremity supported;During functional activity;No upper extremity supported   Standing balance comment: pt able to stand at sink to wash hands without UE support, reliant on bil UE support for gait                            Cognition Arousal/Alertness: Awake/alert Behavior During Therapy: Flat affect Overall Cognitive Status: Impaired/Different from baseline Area of Impairment: Orientation;Following commands;Safety/judgement;Problem solving                 Orientation Level: Disoriented to;Time;Situation     Following Commands: Follows one step commands consistently;Follows multi-step commands inconsistently Safety/Judgement: Decreased awareness of safety;Decreased awareness of deficits   Problem Solving: Slow processing;Requires verbal cues General Comments: pt incontinent of urine in standing and completely unaware with cues for urinal use with total assist for linen change. Pt oriented to year not to month or day      Exercises      General Comments  Pertinent Vitals/Pain Pain Assessment: No/denies pain    Home Living                      Prior Function            PT Goals (current goals can now be found in the care plan section) Progress towards PT goals: Progressing toward goals     Frequency    Min 3X/week      PT Plan Current plan remains appropriate    Co-evaluation              AM-PAC PT "6 Clicks" Mobility   Outcome Measure  Help needed turning from your back to your side while in a flat bed without using bedrails?: A Little Help needed moving from lying on your back to sitting on the side of a flat bed without using bedrails?: A Little Help needed moving to and from a bed to a chair (including a wheelchair)?: A Little Help needed standing up from a chair using your arms (e.g., wheelchair or bedside chair)?: A Little Help needed to walk in hospital room?: A Lot Help needed climbing 3-5 steps with a railing? : A Lot 6 Click Score: 16    End of Session Equipment Utilized During Treatment: Gait belt Activity Tolerance: Patient tolerated treatment well Patient left: in chair;with call bell/phone within reach;with chair alarm set Nurse Communication: Mobility status;Precautions PT Visit Diagnosis: Unsteadiness on feet (R26.81);Other abnormalities of gait and mobility (R26.89);Muscle weakness (generalized) (M62.81)     Time: 5945-8592 PT Time Calculation (min) (ACUTE ONLY): 34 min  Charges:  $Gait Training: 8-22 mins $Therapeutic Activity: 8-22 mins                     Toluwani Yadav Pam Drown, PT Acute Rehabilitation Services Pager: [phone] Office: [place] 05/03/2019, 12:38 PM

## 2019-05-03 NOTE — Progress Notes (Signed)
    Durable Medical Equipment  (From admission, onward)         Start     Ordered   05/03/19 1437  For home use only DME 3 n 1  Once     05/03/19 1436   05/03/19 1435  For home use only DME Walker rolling  Once    Question:  Patient needs a walker to treat with the following condition  Answer:  Weakness   05/03/19 1434   05/03/19 1434  For home use only DME Hospital bed  Once    Question Answer Comment  Length of Need Lifetime   The above medical condition requires: Patient requires the ability to reposition frequently   Head must be elevated greater than: 45 degrees   Bed type Semi-electric   Support Surface: Gel Overlay      05/03/19 1434   05/03/19 1044  For home use only DME oxygen  Once    Question Answer Comment  Length of Need 6 Months   Mode or (Route) Nasal cannula   Liters per Minute 2   Frequency Only at night (stationary unit needed)   Oxygen delivery system Gas      05/03/19 1043

## 2019-05-03 NOTE — Progress Notes (Signed)
Pt oriented to self only. Pt believes he saw 2 little girls who needed help. Pt trying to get out of bed and has pulled continuous O2 monitor off. Pt very agitated by the continuous pulse ox wire bc it is attached to his finger. Daughter was called to reassure the pt during the first period of confusion/agitation. During this current episode the pt was assisted to urinate while standing up and then was walked the length of the hallway. Ativan given. Will attempt to reattach continuous pulse ox when pt resting.

## 2019-05-03 NOTE — Progress Notes (Signed)
Progress Note    [name]  EGB:[id number] DOB: 11-21-1931  DOA: 04/29/2019 PCP: Rutherford Guys, MD    Brief Narrative:    Medical records reviewed and are as summarized below:  [name] is an 85 y.o. male with medical history significant of seasonal allergies, basal cell carcinoma, hyperlipidemia, hypertension, osteoporosis and eczema who was brought in by family secondary to sudden onset of worsening confusion in the last few days.  Patient has been confused with occasional combativeness.  He was seen by his primary care physician who ordered an MRI on 6/30 which was negative.  Patient continues to be confused at home and daughter brought him to the ER for evaluation.  No new medications.  Work up thus far is unrevealing.  Was at the beach on 6/6-6/13-- had some tequila (not much) and when he came home he was c/o headaches and then became delirious.    Assessment/Plan:   Principal Problem:   Altered mental status Active Problems:   Dyslipidemia   Chronic renal insufficiency   Essential hypertension   History of CVA (cerebrovascular accident)   Impaired mobility   Confusion  Altered mental status/delitium: Cause is not clear.  Could be secondary to worsening dementia.  Symptoms were sudden but still could just be the tipping point.  No obvious evidence of seizure- EEG negative.   Labs unremarkable -no sign of infection -no bladder obstruction -no fecal impaction -still not back to baseline -drinks occasional alcohol-- more in the last 2 week-- slim chance of this being withdrawal -added low dose trazodone for sleep aid-- tirate up (will use zyprxa PRN if needed) ? OSA-- hypoxia with sleeping -history from daughter and care giver mixed-- daughter says no dementia but he does have issues with short term  Memory-- daughter says mentation changed suddenly--- caregiver says he has had slow change over last 1 month with sleep issues -recent trip to the beach  hypertension:   -Resume home regimen of blood pressure medications with additional agent norvasc  hyperlipidemia:  -Continue with home regimen.  history of TIA/CVA:  -MRI from prior to admission -patient should be on plavix (when sent home from last hospitalization it was on ASA/plavix x 3 weeks then plavix alone)  impaired mobility:  -PT/OT consult  hyponatremia: This is mild.  Also chronic.  -better with re-hydration     Family Communication/Anticipated D/C date and plan/Code Status   DVT prophylaxis: Lovenox ordered. Code Status: dnr Family Communication: spoke with daughter Disposition Plan: home in AM   Medical Consultants:    None.   Subjective:  Periods of confabulation but oriented to person, place, time  Objective:    Vitals:   05/03/19 0802 05/03/19 0803 05/03/19 1127 05/03/19 1152  BP:   118/61   Pulse:  73 68   Resp:   18   Temp:   98 F (36.7 C) (!) 96 F (35.6 C)  TempSrc:   Oral   SpO2: (!) 85% 92% 90% 96%  Weight:        Intake/Output Summary (Last 24 hours) at 05/03/2019 1444 Last data filed at 05/03/2019 1034 Gross per 24 hour  Intake 240 ml  Output 300 ml  Net -60 ml   Filed Weights   05/01/19 0450 05/02/19 0609 05/03/19 0652  Weight: 65 kg 63.7 kg 64.3 kg    Exam: In bed, oriented to person, place and time rrr Diminished lungs but no crackles and no wheezing No edema Skin dry  Data  Reviewed:   I have personally reviewed following labs and imaging studies:  Labs: Labs show the following:   Basic Metabolic Panel: Recent Labs  Lab 04/27/19 1627 04/29/19 1509 04/30/19 0308  NA 131* 131* 133*  K 4.5 4.7 3.9  CL 94* 97* 95*  CO2 20 24 28   GLUCOSE 101* 103* 100*  BUN 11 13 10   CREATININE 0.92 1.00 1.05  CALCIUM 9.5 9.0 9.0   GFR Estimated Creatinine Clearance: 46.8 mL/min (by C-G formula based on SCr of 1.05 mg/dL). Liver Function Tests: Recent Labs  Lab 04/29/19 1644 04/30/19 0308  AST 23 20  ALT 13 13  ALKPHOS 78 76   BILITOT 0.8 1.1  PROT 6.7 6.7  ALBUMIN 3.6 3.5   No results for input(s): LIPASE, AMYLASE in the last 168 hours. Recent Labs  Lab 04/29/19 1715  AMMONIA 17   Coagulation profile No results for input(s): INR, PROTIME in the last 168 hours.  CBC: Recent Labs  Lab 04/27/19 1607 04/29/19 1509 04/30/19 0308  WBC 9.2 11.0* 8.5  HGB 15.3* 17.7* 14.9  HCT 44.2* 52.8* 42.7  MCV 92.9 94.0 91.2  PLT  --  251 216   Cardiac Enzymes: No results for input(s): CKTOTAL, CKMB, CKMBINDEX, TROPONINI in the last 168 hours. BNP (last 3 results) No results for input(s): PROBNP in the last 8760 hours. CBG: Recent Labs  Lab 05/02/19 0654 05/02/19 1110 05/02/19 1736  GLUCAP 92 93 90   D-Dimer: No results for input(s): DDIMER in the last 72 hours. Hgb A1c: No results for input(s): HGBA1C in the last 72 hours. Lipid Profile: No results for input(s): CHOL, HDL, LDLCALC, TRIG, CHOLHDL, LDLDIRECT in the last 72 hours. Thyroid function studies: No results for input(s): TSH, T4TOTAL, T3FREE, THYROIDAB in the last 72 hours.  Invalid input(s): FREET3 Anemia work up: No results for input(s): VITAMINB12, FOLATE, FERRITIN, TIBC, IRON, RETICCTPCT in the last 72 hours. Sepsis Labs: Recent Labs  Lab 04/27/19 1607 04/29/19 1509 04/30/19 0308  WBC 9.2 11.0* 8.5    Microbiology Recent Results (from the past 240 hour(s))  SARS Coronavirus 2 (CEPHEID - Performed in [facility] hospital lab), Hosp Order     Status: None   Collection Time: 04/29/19 11:15 PM   Specimen: Nasopharyngeal Swab  Result Value Ref Range Status   SARS Coronavirus 2 NEGATIVE NEGATIVE Final    Comment: (NOTE) If result is NEGATIVE SARS-CoV-2 target nucleic acids are NOT DETECTED. The SARS-CoV-2 RNA is generally detectable in upper and lower  respiratory specimens during the acute phase of infection. The lowest  concentration of SARS-CoV-2 viral copies this assay can detect is 250  copies / mL. A negative result does  not preclude SARS-CoV-2 infection  and should not be used as the sole basis for treatment or other  patient management decisions.  A negative result may occur with  improper specimen collection / handling, submission of specimen other  than nasopharyngeal swab, presence of viral mutation(s) within the  areas targeted by this assay, and inadequate number of viral copies  (<250 copies / mL). A negative result must be combined with clinical  observations, patient history, and epidemiological information. If result is POSITIVE SARS-CoV-2 target nucleic acids are DETECTED. The SARS-CoV-2 RNA is generally detectable in upper and lower  respiratory specimens dur ing the acute phase of infection.  Positive  results are indicative of active infection with SARS-CoV-2.  Clinical  correlation with patient history and other diagnostic information is  necessary to determine patient  infection status.  Positive results do  not rule out bacterial infection or co-infection with other viruses. If result is PRESUMPTIVE POSTIVE SARS-CoV-2 nucleic acids MAY BE PRESENT.   A presumptive positive result was obtained on the submitted specimen  and confirmed on repeat testing.  While 2019 novel coronavirus  (SARS-CoV-2) nucleic acids may be present in the submitted sample  additional confirmatory testing may be necessary for epidemiological  and / or clinical management purposes  to differentiate between  SARS-CoV-2 and other Sarbecovirus currently known to infect humans.  If clinically indicated additional testing with an alternate test  methodology [phone]) is advised. The SARS-CoV-2 RNA is generally  detectable in upper and lower respiratory sp ecimens during the acute  phase of infection. The expected result is Negative. Fact Sheet for Patients:  StrictlyIdeas.no Fact Sheet for Healthcare Providers: BankingDealers.co.za This test is not yet approved or  cleared by the [place] FDA and has been authorized for detection and/or diagnosis of SARS-CoV-2 by FDA under an Emergency Use Authorization (EUA).  This EUA will remain in effect (meaning this test can be used) for the duration of the COVID-19 declaration under Section 564(b)(1) of the Act, 21 U.S.C. section 360bbb-3(b)(1), unless the authorization is terminated or revoked sooner. Performed at [facility], [place] [address] [place], [place] [address]     Procedures and diagnostic studies:  Dg Chest Port 1 View  Result Date: 05/02/2019 CLINICAL DATA:  Shortness of breath. EXAM: PORTABLE CHEST 1 VIEW COMPARISON:  April 29, 2019 FINDINGS: Two nodular densities project over the left apex represent healing fractures seen on previous studies. The heart, hila, mediastinum, lungs, and pleura are otherwise unremarkable. IMPRESSION: No acute abnormalities. Electronically Signed   By: Dorise Bullion III M.D   On: 05/02/2019 17:02    Medications:   . amLODipine  5 mg Oral Daily  . clopidogrel  75 mg Oral Daily  . enoxaparin (LOVENOX) injection  40 mg Subcutaneous Q24H  . folic acid  1 mg Oral Daily  . metoprolol succinate  50 mg Oral Daily  . multivitamin with minerals  1 tablet Oral Daily  . pravastatin  40 mg Oral Daily  . sodium chloride flush  3 mL Intravenous Once  . thiamine  100 mg Oral Daily   Or  . thiamine  100 mg Intravenous Daily  . traZODone  50 mg Oral QHS   Continuous Infusions:   LOS: 3 days   Geradine Girt  [facility]   How to contact the [facility] Attending or Consulting provider [place] or covering provider during after hours [place], for this patient?  1. Check the care team in [facility] and look for a) attending/consulting TR[facility] provider listed and b) the [facility] team listed 2. Log into [email or web address] and use [place]'s universal password to access. If you do not have the password, please contact the hospital operator. 3. Locate the [facility] provider you are looking  for under [facility] and page to a number that you can be directly reached. 4. If you still have difficulty reaching the provider, please page the [facility] (Director on Call) for the Hospitalists listed on amion for assistance.  05/03/2019, 2:44 PM

## 2019-05-03 NOTE — Progress Notes (Signed)
Referral for DME equipment ( hospital bed, oxygen, BSC, rolling walker) made with [employer]. Pt active with [facility] for home health services .... NCM made liaison aware.   NCM to f/u with TOC  Needs ... Whitman Hero RN,BSN,CM

## 2019-05-03 NOTE — Care Management Important Message (Signed)
Important Message  Patient Details  Name: [name] MRN: [id number] Date of Birth: 1932-06-23   Medicare Important Message Given:  Yes     [name] 05/03/2019, 1:03 PM

## 2019-05-04 ENCOUNTER — Other Ambulatory Visit: Payer: Self-pay | Admitting: Family Medicine

## 2019-05-04 ENCOUNTER — Telehealth: Payer: Self-pay | Admitting: Family Medicine

## 2019-05-04 DIAGNOSIS — I10 Essential (primary) hypertension: Secondary | ICD-10-CM

## 2019-05-04 DIAGNOSIS — J986 Disorders of diaphragm: Secondary | ICD-10-CM

## 2019-05-04 DIAGNOSIS — R000 Tachycardia, unspecified: Secondary | ICD-10-CM

## 2019-05-04 DIAGNOSIS — J984 Other disorders of lung: Secondary | ICD-10-CM

## 2019-05-04 MED ORDER — TRAZODONE HCL 50 MG PO TABS: 50.0000 mg | ORAL_TABLET | Freq: Every day | ORAL | 0 refills | Status: DC

## 2019-05-04 MED ORDER — CLOPIDOGREL BISULFATE 75 MG PO TABS: 75.0000 mg | ORAL_TABLET | Freq: Every day | ORAL | 0 refills | Status: DC

## 2019-05-04 NOTE — Progress Notes (Signed)
Physical Therapy Treatment Patient Details Name: [name] MRN: [id number] DOB: [birth year] Today's Date: 05/04/2019    History of Present Illness Patient is a 85 y/o male presenting to the ED on 04/29/2019 with confusion. Past medical history significant of seasonal allergies, basal cell carcinoma, hyperlipidemia, hypertension, osteoporosis and eczema. Admitted for AMS with continued work-up.     PT Comments    Pt pleasant, in chair on arrival and oriented. Pt unaware how long he has been in chair but following commands for mobility other than with fatigue. Pt continues to have periods of scissoring, veering and leaning with gait with fatigue with no ability to self regulate or judge his activity tolerance. Pt educated for HEP and continued need for assist with mobility at all times.   Pt on RA throughout session with SpO2 90-95% HR 82-93    Follow Up Recommendations  Home health PT;Supervision/Assistance - 24 hour;SNF     Equipment Recommendations  None recommended by PT    Recommendations for Other Services       Precautions / Restrictions Precautions Precautions: Fall    Mobility  Bed Mobility               General bed mobility comments: in chair on arrival and end of session  Transfers Overall transfer level: Needs assistance   Transfers: Sit to/from Stand Sit to Stand: Min guard         General transfer comment: cues for hand placement without physical assist to rise today  Ambulation/Gait Ambulation/Gait assistance: Min assist;Mod assist Gait Distance (Feet): 200 Feet Assistive device: Rolling walker (2 wheeled) Gait Pattern/deviations: Step-through pattern;Decreased stride length;Trunk flexed   Gait velocity interpretation: 1.31 - 2.62 ft/sec, indicative of limited community ambulator General Gait Details: pt with good stability with RW with cues for posture and position in RW with assist to direct RW. cues for self regulation but pt remains unable to  perform. Last 103' pt with left lean and progression from min to mod assist with fatigue. Pt leaning into therapist on left today and did the same to right last session (side of therapist).   Stairs             Wheelchair Mobility    Modified Rankin (Stroke Patients Only)       Balance Overall balance assessment: Needs assistance Sitting-balance support: No upper extremity supported;Feet supported Sitting balance-Leahy Scale: Good     Standing balance support: Bilateral upper extremity supported;During functional activity;No upper extremity supported   Standing balance comment: pt able to stand statically to don mask without UE support, reliant on bil UE support for gait                            Cognition Arousal/Alertness: Awake/alert Behavior During Therapy: Flat affect Overall Cognitive Status: Impaired/Different from baseline Area of Impairment: Following commands;Safety/judgement;Problem solving                       Following Commands: Follows one step commands consistently;Follows multi-step commands inconsistently Safety/Judgement: Decreased awareness of safety;Decreased awareness of deficits   Problem Solving: Slow processing;Requires verbal cues General Comments: pt oriented x 4 today with ability to perform mobility with verbal cues. Continues to lack awareness of deficits and need for assist with mobility      Exercises General Exercises - Lower Extremity Long Arc Quad: AROM;20 reps;Both;Seated Hip ABduction/ADduction: AROM;20 reps;Both;Seated Hip Flexion/Marching: AROM;20 reps;Both;Seated    General  Comments        Pertinent Vitals/Pain Pain Assessment: No/denies pain    Home Living                      Prior Function            PT Goals (current goals can now be found in the care plan section) Progress towards PT goals: Progressing toward goals    Frequency           PT Plan Current plan remains  appropriate    Co-evaluation              AM-PAC PT "6 Clicks" Mobility   Outcome Measure  Help needed turning from your back to your side while in a flat bed without using bedrails?: A Little Help needed moving from lying on your back to sitting on the side of a flat bed without using bedrails?: A Little Help needed moving to and from a bed to a chair (including a wheelchair)?: A Little Help needed standing up from a chair using your arms (e.g., wheelchair or bedside chair)?: A Little Help needed to walk in hospital room?: A Lot Help needed climbing 3-5 steps with a railing? : A Lot 6 Click Score: 16    End of Session Equipment Utilized During Treatment: Gait belt Activity Tolerance: Patient tolerated treatment well Patient left: in chair;with call bell/phone within reach;with chair alarm set Nurse Communication: Mobility status;Precautions PT Visit Diagnosis: Unsteadiness on feet (R26.81);Other abnormalities of gait and mobility (R26.89);Muscle weakness (generalized) (M62.81)     Time: 3779-3968 PT Time Calculation (min) (ACUTE ONLY): 21 min  Charges:  $Gait Training: 8-22 mins                     Sway Guttierrez Pam Drown, PT Acute Rehabilitation Services Pager: [phone] Office: [place] 05/04/2019, 12:21 PM

## 2019-05-04 NOTE — Discharge Instructions (Signed)
Delirium °Delirium is a state of mental confusion. It comes on quickly and causes significant changes in a person's thinking and behavior. °People with delirium usually have trouble paying attention to what is going on or knowing where they are. They may become very withdrawn or very emotional and unable to sit still. They may even see or feel things that are not there (hallucinations). Delirium is a sign of a serious underlying medical condition. °What are the causes? °Delirium occurs when something suddenly affects the signals that the brain sends out. Brain signals can be affected by anything that puts severe stress on the body and brain and causes brain chemicals to be out of balance. The most common causes of delirium include: °· Infections. These may be bacterial, viral, fungal, or protozoal. °· Medicines. These include many over-the-counter and prescription medicines. °· Recreational drugs. °· Substance withdrawal. This occurs with sudden discontinuation of alcohol, certain medicines, or recreational drugs. °· Surgery and anesthesia. °· Sudden vascular events, such as stroke and brain hemorrhage. °· Other brain disorders, such as migraines, tumors, seizures, and physical head trauma. °· Metabolic disorders, such as kidney or liver failure. °· Low blood oxygen (anoxia). This may occur with lung disease, cardiac arrest, or carbon monoxide poisoning. °· Hormone imbalances (endocrinopathies), such as an overactive thyroid (hyperthyroidism) or underactive thyroid (hypothyroidism). °· Vitamin deficiencies. °What increases the risk? °The following factors may make someone more likely to develop this condition. °· Being a child. °· Being an older person. °· Living alone. °· Having vision loss or hearing loss. °· Having an existing brain disease, such as dementia. °· Having long-lasting (chronic) medical conditions, such as heart disease. °· Being hospitalized for long periods of time. °What are the signs or  symptoms? °Delirium starts with a sudden change in a person's thinking or behavior. Symptoms include: °· Not being able to stay awake (drowsiness) or pay attention. °· Being confused about places, time, and people. °· Forgetfulness. °· Having extreme energy levels. These may be low or high. °· Changes in sleep patterns. °· Extreme mood swings, such as sudden anger or anxiety. °· Focusing on things or ideas that are not important. °· Rambling and senseless talking. °· Difficulty speaking, understanding speech, or both. °· Hallucinations. °· Tremor or unsteady gait. °Symptoms come and go (fluctuate) over time, and they are often worse at the end of the day. °How is this diagnosed? °People with delirium may not realize that they have the condition. Often, a family member or health care provider is the first person to notice the changes. This condition may be diagnosed based on a physical exam, health history, and tests. °· The health care provider will obtain a detailed history. This may include questions about: °? Current symptoms. °? Medical issues. °? Medicines. °? Recreational drug use. °· The health care provider will perform a mental status examination by: °? Asking questions to check for confusion. °? Watching for abnormal behavior. °· The health care provider may also order lab tests or additional studies to determine the cause of the delirium. °How is this treated? °Treatment of delirium depends on the cause and severity. Delirium usually goes away within days or weeks of treating the underlying cause. In the meantime, do not leave the person alone because he or she may accidentally cause self-harm. This condition may be treated with supportive care, such as: °· Increased light during the day and decreased light at night. °· Low noise level. °· Uninterrupted sleep. °· A regular daily schedule. °·   Clocks and calendars to help with orientation. °· Familiar objects, including the person's pictures and  clothing. °· Frequent visits from familiar family and friends. °· A healthy diet. °· Gentle exercise. °In more severe cases of delirium, medicine may be prescribed to help the person keep calm and think more clearly. °Follow these instructions at home: °· Continue supportive care as told by a health care provider. °· Over-the-counter and prescription medicines should be taken only as told by a health care provider. °· Ask a health care provider before using herbs or supplements. °· Do not use alcohol or recreational drugs. °· Keep all follow-up visits as told by a health care provider. This is important. °Contact a health care provider if: °· Symptoms do not get better or they become worse. °· New symptoms of delirium develop. °· Caring for the person at home does not seem safe. °· Eating, drinking, or communicating stops. °· There are side effects of medicines, such as changes in sleep patterns, dizziness, weight gain, restlessness, movement changes, or tremors. °Get help right away if: °· Serious thoughts occur about self-harm or about hurting others. °· There are serious side effects of medicine, such as: °? Swelling of the face, lips, tongue, or throat. °? Fever, confusion, muscle spasms, or seizures. °Summary °· Delirium is a state of mental confusion. It comes on quickly and causes significant changes in a person's thinking and behavior. °· Delirium is a sign of a serious underlying medical condition. °· Certain medical conditions or a long hospital stay may increase the risk of developing delirium. °· Treatment of delirium involves treating the underlying cause and providing supportive treatments, such as a calm and familiar environment. °This information is not intended to replace advice given to you by your health care provider. Make sure you discuss any questions you have with your health care provider. °Document Released: 07/08/2012 Document Revised: 06/04/2018 Document Reviewed: 06/04/2018 °Elsevier  Patient Education © 2020 [employer] ° °

## 2019-05-04 NOTE — Progress Notes (Addendum)
Called and left message for [name] in regards to discharge today, asked to call back [facility] called back and they are still coordinating oxygen delivery,getting rx etc. She said it will be around 5pm. He wears depends and will bring clothes and depends for pt at discharge, instructed to have front desk call us and get the belongings

## 2019-05-04 NOTE — Progress Notes (Signed)
Occupational Therapy Treatment Patient Details Name: [name] MRN: [id number] DOB: 1932/09/16 Today's Date: 05/04/2019    History of present illness Patient is a 85 y/o male presenting to the ED on 04/29/2019 with confusion. Past medical history significant of seasonal allergies, basal cell carcinoma, hyperlipidemia, hypertension, osteoporosis and eczema. Admitted for AMS with continued work-up.    OT comments  Patient progressing.  He continues to require min guard for transfers and in room mobility using RW, cueing for hand placement and safety with walker mgmt.  Patient able to complete grooming standing at sink, min guard to min assist based on UE support (1-2 min guard, 0 min assist).  He requires cueing for seated rest breaks when fatiguing.  Educated on importance of having assist for mobility/ADLs at discharge, pt agreeable.  Will follow acutely, but pt plans to dc home today.    Follow Up Recommendations  Home health OT;Supervision/Assistance - 24 hour    Equipment Recommendations  None recommended by OT    Recommendations for Other Services      Precautions / Restrictions Precautions Precautions: Fall Restrictions Weight Bearing Restrictions: No       Mobility Bed Mobility               General bed mobility comments: in chair upon arrival  Transfers Overall transfer level: Needs assistance Equipment used: Rolling walker (2 wheeled) Transfers: Sit to/from Stand Sit to Stand: Min guard         General transfer comment: cues for hand placement without physical assist, min guard for balance/ safety     Balance Overall balance assessment: Needs assistance Sitting-balance support: No upper extremity supported;Feet supported Sitting balance-Leahy Scale: Good     Standing balance support: No upper extremity supported;Bilateral upper extremity supported;Single extremity supported;During functional activity Standing balance-Leahy Scale: Poor Standing balance  comment: pt able to stand statically with 0 hand support during grooming with minA, preference to 1-2 UE support                           ADL either performed or assessed with clinical judgement   ADL Overall ADL's : Needs assistance/impaired     Grooming: Min guard;Minimal assistance;Standing;Oral care;Wash/dry hands;Wash/dry face;Sitting Grooming Details (indicate cue type and reason): completed oral care standing, min guard to min assist for balance; completed remainder of grooming seated due to fatigue                  Toilet Transfer: Min guard;Ambulation;RW Toilet Transfer Details (indicate cue type and reason): simulated to recliner          Functional mobility during ADLs: Min guard;Rolling walker General ADL Comments: pt limited by cognition, impaired balance, generalized weakness      Vision   Vision Assessment?: No apparent visual deficits   Perception     Praxis      Cognition Arousal/Alertness: Awake/alert Behavior During Therapy: Flat affect Overall Cognitive Status: Impaired/Different from baseline Area of Impairment: Following commands;Safety/judgement;Problem solving                       Following Commands: Follows one step commands consistently;Follows multi-step commands inconsistently Safety/Judgement: Decreased awareness of safety;Decreased awareness of deficits   Problem Solving: Slow processing;Requires verbal cues General Comments: pt continues to require cueing for safety awareness and problem solving        Exercises     Shoulder Instructions  General Comments      Pertinent Vitals/ Pain       Pain Assessment: No/denies pain  Home Living                                          Prior Functioning/Environment              Frequency  Min 2X/week        Progress Toward Goals  OT Goals(current goals can now be found in the care plan section)  Progress towards OT goals:  Progressing toward goals  Acute Rehab OT Goals Patient Stated Goal: home today OT Goal Formulation: With patient  Plan Discharge plan remains appropriate;Frequency remains appropriate    Co-evaluation                 AM-PAC OT "6 Clicks" Daily Activity     Outcome Measure   Help from another person eating meals?: None Help from another person taking care of personal grooming?: None(seated ) Help from another person toileting, which includes using toliet, bedpan, or urinal?: A Little Help from another person bathing (including washing, rinsing, drying)?: A Little Help from another person to put on and taking off regular upper body clothing?: A Little Help from another person to put on and taking off regular lower body clothing?: A Lot 6 Click Score: 19    End of Session Equipment Utilized During Treatment: Gait belt;Rolling walker  OT Visit Diagnosis: Unsteadiness on feet (R26.81);Other symptoms and signs involving cognitive function   Activity Tolerance Patient tolerated treatment well   Patient Left in chair;with call bell/phone within reach;with chair alarm set   Nurse Communication Mobility status        Time: 1420-1447 OT Time Calculation (min): 27 min  Charges: OT General Charges $OT Visit: 1 Visit OT Treatments $Self Care/Home Management : 23-37 mins  Delight Stare, [place] Pager [phone] Office [phone]    Delight Stare 05/04/2019, 4:35 PM

## 2019-05-04 NOTE — TOC Transition Note (Signed)
Transition of Care [facility]) - CM/SW Discharge Note   Patient Details  Name: [name] MRN: [id number] Date of Birth: 04-Jan-1932  [facility] CM/SW Contact:  Sharin Mons, RN Phone Number: 05/04/2019, 2:23 PM   Clinical Narrative:    Presented with AMS, hx ofseasonal allergies, basal cell carcinoma, hyperlipidemia, hypertension, osteoporosis and eczema.  Pt will transition to home today with family. Awaiting delivery of DME equipment, ex. Hospital bed, oxygen) prior to discharging pt to home. Home health services to resume with [facility] (RN,PT,OT).    [name] Pons-Cummings (Daughter)      [phone]      Daughter to provide transportation to home. Final next level of care: [place] Barriers to Discharge: No Barriers Identified   Patient Goals and CMS Choice     Choice offered to / list presented to : Patient, Adult Children  Discharge Placement                       Discharge Plan and Services                DME Arranged: Hospital bed, Oxygen, 3-N-1, Walker rolling DME Agency: [employer] Date DME Agency Contacted: 05/03/19 Time DME Agency Contacted: [phone] Representative spoke with at DME Agency: [place]: RN, PT, OT HH Agency: [facility] Date [place]: 05/03/19 Time [place]: 1646 Representative spoke with at [place]: Cassie   PCP: Grant Fontana  Social Determinants of Health (SDOH) Interventions     Readmission Risk Interventions No flowsheet data found.

## 2019-05-04 NOTE — Telephone Encounter (Signed)
Mo, RN with [facility] calling to notify [place] that patients start of care is delayed because they are unable to reach the patient.

## 2019-05-04 NOTE — Discharge Summary (Addendum)
Physician Discharge Summary  Liberato Stansbery CLE:[id number] DOB: 1932/02/06 DOA: 04/29/2019  PCP: Rutherford Guys, MD  Admit date: 04/29/2019 Discharge date: 05/04/2019  Admitted From: home Discharge disposition: home   Recommendations for Outpatient Follow-Up:   1. Trazodone started for sleep-- if has further issues can consider zyprexa or depakote 2. Oxygen at night for restrictive lung disease due to hemidiaphragm -- consider sleep study as well 3. Referral to neurology for formal neuropsych work up (already has appointment with [name]) 4. Home health 5. DME: hospital bed, 3:1 6. No episodes of brady cardia on tele here--- continue metoprolol 7. Also resumed plavix as he is still to be on this (from last admission- ASA/plvix x 3 weeks then plavix alone per [name])   Discharge Diagnosis:   delirium   Dyslipidemia   Chronic renal insufficiency   Essential hypertension   History of CVA (cerebrovascular accident)   Impaired mobility   Diaphragm paralysis   Restrictive lung disease    Discharge Condition: Improved.  Diet recommendation: Low sodium, heart healthy  Wound care: None.  Code status: Full.   History of Present Illness:   [name] is a 85 y.o. male with medical history significant of seasonal allergies, basal cell carcinoma, hyperlipidemia, hypertension, osteoporosis and eczema who was brought in by family secondary to sudden onset of worsening confusion in the last few days.  Patient has been confused with occasional combativeness.  He was seen by his primary care physician who ordered an MRI on 6/30 which was negative.  Patient continues to be confused at home and daughter brought him to the ER for evaluation.  No new medications.  No recent injuries or head injury, no fever or chills, work-up in the ER showed so far showed no evidence of UTI or pneumonia.  No seizure episodes.  Symptoms happened suddenly with no known history of dementia.  Patient is  therefore being admitted to the hospital for evaluation.Marland Kitchen   Hospital Course by Problem:   Altered mental status/delitium:Cause is not clear. Could be secondary to worsening dementia. Symptoms were sudden but still could just be the tipping point. No obvious evidence of seizure- EEG negative.  Labs unremarkable -no sign of infection -no bladder obstruction -no fecal impaction -drinks occasional alcohol-- more in the last 2 week-- slim chance of this being withdrawal -added low dose trazodone for sleep-- titrated up with good results -history from daughter and care giver mixed-- daughter says no dementia but he does have issues with short term  Memory-- daughter says mentation changed suddenly--- caregiver says he has had slow change over last 1 month with sleep issues  hypertension: -Resume home regimen of blood pressure medications  Hypoxia at night (periods of 85-88% on overnight pulse ox) -? From OSA or from restrictive lung disease due to hiatal hernia/hemidiaphragm -will arrange nocturnal O2 -studies have shown unilateral hemidiaphragm association with positional hypoxemia in REM sleep -will arrange for hospital bed which may also help with hypoxemia issues  hyperlipidemia: -Continue with home regimen.  history of TIA/CVA: -MRI from prior to admission -patient should be on plavix (when sent home from last hospitalization it was on ASA/plavix x 3 weeks then plavix alone)  impaired mobility: -PT/OT consult  hyponatremia:This is mild. Also chronic.  -better with re-hydration    Medical Consultants:      Discharge Exam:   Vitals:   05/04/19 0311 05/04/19 1017  BP: 136/74 110/70  Pulse: 81 71  Resp: 18  Temp: 97.8 F (36.6 C)   SpO2: 100% 96%   Vitals:   05/03/19 1152 05/03/19 2300 05/04/19 0311 05/04/19 1017  BP:  112/70 136/74 110/70  Pulse:  81 81 71  Resp:  18 18   Temp: (!) 96 F (35.6 C) (!) 96.8 F (36 C) 97.8 F (36.6 C)    TempSrc:   Oral   SpO2: 96% 96% 100% 96%  Weight:   63.8 kg     General exam: Appears calm and comfortable.   The results of significant diagnostics from this hospitalization (including imaging, microbiology, ancillary and laboratory) are listed below for reference.     Procedures and Diagnostic Studies:   Dg Chest 2 View  Result Date: 04/29/2019 CLINICAL DATA:  85 year old male with atrial fibrillation. EXAM: CHEST - 2 VIEW COMPARISON:  01/13/2019 and earlier. FINDINGS: Upright AP and lateral views of the chest. Chronic elevation of the right hemidiaphragm. Stable lung volumes. Chronic hiatal hernia. No cardiomegaly. Other mediastinal contours are within normal limits. Chronic rightward deviation of the trachea at the thoracic inlet. No pneumothorax, pulmonary edema, pleural effusion or acute pulmonary opacity. Negative visible bowel gas pattern. Osteopenia. No acute osseous abnormality identified. Abdominal Calcified aortic atherosclerosis. IMPRESSION: 1. No acute cardiopulmonary abnormality. 2. Chronic hiatal hernia and elevation of the right hemidiaphragm. 3. Chronic left thyroid goiter suspected. Electronically Signed   By: Genevie Ann M.D.   On: 04/29/2019 15:44     Labs:   Basic Metabolic Panel: Recent Labs  Lab 04/27/19 1627 04/29/19 1509 04/30/19 0308  NA 131* 131* 133*  K 4.5 4.7 3.9  CL 94* 97* 95*  CO2 _0 GLUCOSE 101* 103* 100*  BUN _1 CREATININE 0.92 1.00 1.05  CALCIUM 9.5 9.0 9.0   GFR Estimated Creatinine Clearance: 46.4 mL/min (by C-G formula based on SCr of 1.05 mg/dL). Liver Function Tests: Recent Labs  Lab 04/29/19 1644 04/30/19 0308  AST 23 20  ALT 13 13  ALKPHOS 78 76  BILITOT 0.8 1.1  PROT 6.7 6.7  ALBUMIN 3.6 3.5   No results for input(s): LIPASE, AMYLASE in the last 168 hours. Recent Labs  Lab 04/29/19 1715  AMMONIA 17   Coagulation profile No results for input(s): INR, PROTIME in the last 168 hours.  CBC: Recent Labs  Lab  04/27/19 1607 04/29/19 1509 04/30/19 0308  WBC 9.2 11.0* 8.5  HGB 15.3* 17.7* 14.9  HCT 44.2* 52.8* 42.7  MCV 92.9 94.0 91.2  PLT  --  251 216   Cardiac Enzymes: No results for input(s): CKTOTAL, CKMB, CKMBINDEX, TROPONINI in the last 168 hours. BNP: Invalid input(s): POCBNP CBG: Recent Labs  Lab 05/02/19 0654 05/02/19 1110 05/02/19 1736  GLUCAP 92 93 90   D-Dimer No results for input(s): DDIMER in the last 72 hours. Hgb A1c No results for input(s): HGBA1C in the last 72 hours. Lipid Profile No results for input(s): CHOL, HDL, LDLCALC, TRIG, CHOLHDL, LDLDIRECT in the last 72 hours. Thyroid function studies No results for input(s): TSH, T4TOTAL, T3FREE, THYROIDAB in the last 72 hours.  Invalid input(s): FREET3 Anemia work up No results for input(s): VITAMINB12, FOLATE, FERRITIN, TIBC, IRON, RETICCTPCT in the last 72 hours. Microbiology Recent Results (from the past 240 hour(s))  SARS Coronavirus 2 (CEPHEID - Performed in [place] hospital lab), Hosp Order     Status: None   Collection Time: 04/29/19 11:15 PM   Specimen: Nasopharyngeal Swab  Result Value Ref Range Status   SARS  Coronavirus 2 NEGATIVE NEGATIVE Final    Comment: (NOTE) If result is NEGATIVE SARS-CoV-2 target nucleic acids are NOT DETECTED. The SARS-CoV-2 RNA is generally detectable in upper and lower  respiratory specimens during the acute phase of infection. The lowest  concentration of SARS-CoV-2 viral copies this assay can detect is 250  copies / mL. A negative result does not preclude SARS-CoV-2 infection  and should not be used as the sole basis for treatment or other  patient management decisions.  A negative result may occur with  improper specimen collection / handling, submission of specimen other  than nasopharyngeal swab, presence of viral mutation(s) within the  areas targeted by this assay, and inadequate number of viral copies  (<250 copies / mL). A negative result must be combined  with clinical  observations, patient history, and epidemiological information. If result is POSITIVE SARS-CoV-2 target nucleic acids are DETECTED. The SARS-CoV-2 RNA is generally detectable in upper and lower  respiratory specimens dur ing the acute phase of infection.  Positive  results are indicative of active infection with SARS-CoV-2.  Clinical  correlation with patient history and other diagnostic information is  necessary to determine patient infection status.  Positive results do  not rule out bacterial infection or co-infection with other viruses. If result is PRESUMPTIVE POSTIVE SARS-CoV-2 nucleic acids MAY BE PRESENT.   A presumptive positive result was obtained on the submitted specimen  and confirmed on repeat testing.  While 2019 novel coronavirus  (SARS-CoV-2) nucleic acids may be present in the submitted sample  additional confirmatory testing may be necessary for epidemiological  and / or clinical management purposes  to differentiate between  SARS-CoV-2 and other Sarbecovirus currently known to infect humans.  If clinically indicated additional testing with an alternate test  methodology [phone]) is advised. The SARS-CoV-2 RNA is generally  detectable in upper and lower respiratory sp ecimens during the acute  phase of infection. The expected result is Negative. Fact Sheet for Patients:  StrictlyIdeas.no Fact Sheet for Healthcare Providers: BankingDealers.co.za This test is not yet approved or cleared by the [place] FDA and has been authorized for detection and/or diagnosis of SARS-CoV-2 by FDA under an Emergency Use Authorization (EUA).  This EUA will remain in effect (meaning this test can be used) for the duration of the COVID-19 declaration under Section 564(b)(1) of the Act, 21 U.S.C. section 360bbb-3(b)(1), unless the authorization is terminated or revoked sooner. Performed at [facility], [place] [address] [place], [place] [address]      Discharge Instructions:   Discharge Instructions    Diet - low sodium heart healthy   Complete by: As directed    Increase activity slowly   Complete by: As directed      Allergies as of 05/04/2019      Reactions   Other Shortness Of Breath, Itching, Other (See Comments)   Seasonal allergies- Runny nose, congestion, itchy eyes, and wheezing      Medication List    TAKE these medications   acetaminophen 500 MG tablet Commonly known as: TYLENOL Take 500-1,000 mg by mouth every 6 (six) hours as needed (for headaches).   albuterol 108 (90 Base) MCG/ACT inhaler Commonly known as: VENTOLIN HFA Inhale 2 puffs into the lungs every 6 (six) hours as needed for wheezing or shortness of breath.   alendronate 70 MG tablet Commonly known as: FOSAMAX Take 1 tablet (70 mg total) by mouth every 7 (seven) days. Take with a full glass of water  on an empty stomach. What changed:   when to take this  additional instructions   beclomethasone 40 MCG/ACT inhaler Commonly known as: Qvar RediHaler Inhale 1 puff into the lungs 2 (two) times daily.   Blood Pressure Kit Please check BP twice a day   clopidogrel 75 MG tablet Commonly known as: PLAVIX Take 1 tablet (75 mg total) by mouth daily. Start taking on: May 05, 2019   docusate sodium 100 MG capsule Commonly known as: COLACE Take 1 capsule (100 mg total) by mouth daily. What changed:   when to take this  reasons to take this   metoprolol succinate 50 MG 24 hr tablet Commonly known as: TOPROL-XL Take 1 tablet (50 mg total) by mouth daily. Take with or immediately following a meal. What changed: additional instructions   pravastatin 40 MG tablet Commonly known as: PRAVACHOL Take 1 tablet (40 mg total) by mouth daily.   senna-docusate 8.6-50 MG tablet Commonly known as: Senokot-S Take 1 tablet by mouth at bedtime as needed for moderate constipation.   traZODone 50 MG tablet Commonly  known as: DESYREL Take 1 tablet (50 mg total) by mouth at bedtime.   Walker Misc Use walker whenever walking, standard walker            Museum/gallery conservator  (From admission, onward)         Start     Ordered   05/04/19 1000  For home use only DME oxygen  Once    Comments: Patient has elevated right hemidiaphragm that can cause positional hypoxemia in REM sleep-- needs O2 at home until sleep study can be done as an outpatient  Question Answer Comment  Length of Need Lifetime   Mode or (Route) Nasal cannula   Liters per Minute 2   Frequency Only at night (stationary unit needed)   Oxygen delivery system Gas      05/04/19 1001   05/04/19 0932  For home use only DME Other see comment  Once    Comments: Bedside tray table  Question:  Length of Need  Answer:  Lifetime   05/04/19 0934   05/03/19 1437  For home use only DME 3 n 1  Once     05/03/19 1436   05/03/19 1435  For home use only DME Walker rolling  Once    Question:  Patient needs a walker to treat with the following condition  Answer:  Weakness   05/03/19 1434   05/03/19 1434  For home use only DME Hospital bed  Once    Question Answer Comment  Length of Need Lifetime   The above medical condition requires: Patient requires the ability to reposition frequently   Head must be elevated greater than: 45 degrees   Bed type Semi-electric   Support Surface: Gel Overlay      05/03/19 1434   05/03/19 1044  For home use only DME oxygen  Once    Question Answer Comment  Length of Need 6 Months   Mode or (Route) Nasal cannula   Liters per Minute 2   Frequency Only at night (stationary unit needed)   Oxygen delivery system Gas      05/03/19 1043         Follow-up Information    [facility] Follow up.   Specialty: [place] Why: skilled nurse and physical therapy - someone will call to schedule an appointment Contact information: [place] [address] [phone]  Rutherford Guys, MD Follow up in 1 week(s).   Specialty: Family Medicine Contact information: [address] [place] [address] [phone]            Time coordinating discharge: 35 min  Signed:  Geradine Girt DO  [facility] 05/04/2019, 11:37 AM

## 2019-05-04 NOTE — Progress Notes (Signed)
Brief therapy note with full note to follow. Pt walked 200' this morning with RW with progression of min to mod assist with fatigue. Pt maintained 90-95% on RA throughout session and does not currently require supplemental oxygen.  Elwyn Reach, PT Acute Rehabilitation Services Pager: [phone] Office: [phone]

## 2019-05-05 ENCOUNTER — Telehealth: Payer: Self-pay | Admitting: Family Medicine

## 2019-05-05 DIAGNOSIS — R4182 Altered mental status, unspecified: Secondary | ICD-10-CM | POA: Diagnosis not present

## 2019-05-05 DIAGNOSIS — I4891 Unspecified atrial fibrillation: Secondary | ICD-10-CM | POA: Diagnosis not present

## 2019-05-05 DIAGNOSIS — M810 Age-related osteoporosis without current pathological fracture: Secondary | ICD-10-CM | POA: Diagnosis not present

## 2019-05-05 DIAGNOSIS — Z8673 Personal history of transient ischemic attack (TIA), and cerebral infarction without residual deficits: Secondary | ICD-10-CM | POA: Diagnosis not present

## 2019-05-05 DIAGNOSIS — R41841 Cognitive communication deficit: Secondary | ICD-10-CM | POA: Diagnosis not present

## 2019-05-05 DIAGNOSIS — I129 Hypertensive chronic kidney disease with stage 1 through stage 4 chronic kidney disease, or unspecified chronic kidney disease: Secondary | ICD-10-CM | POA: Diagnosis not present

## 2019-05-05 DIAGNOSIS — M6281 Muscle weakness (generalized): Secondary | ICD-10-CM | POA: Diagnosis not present

## 2019-05-05 DIAGNOSIS — G3184 Mild cognitive impairment, so stated: Secondary | ICD-10-CM | POA: Diagnosis not present

## 2019-05-05 NOTE — Telephone Encounter (Signed)
Copied from [place] [id number]. Topic: General - Other >> May 05, 2019  3:18 PM Keene Breath wrote: Reason for CRM: Called to request verbal orders for Skilled nursing and Speech eval. - 1x wk for 4 wks.  Questions please call [phone]

## 2019-05-06 DIAGNOSIS — G3184 Mild cognitive impairment, so stated: Secondary | ICD-10-CM | POA: Diagnosis not present

## 2019-05-06 DIAGNOSIS — M810 Age-related osteoporosis without current pathological fracture: Secondary | ICD-10-CM | POA: Diagnosis not present

## 2019-05-06 DIAGNOSIS — Z8673 Personal history of transient ischemic attack (TIA), and cerebral infarction without residual deficits: Secondary | ICD-10-CM | POA: Diagnosis not present

## 2019-05-06 DIAGNOSIS — I4891 Unspecified atrial fibrillation: Secondary | ICD-10-CM | POA: Diagnosis not present

## 2019-05-06 DIAGNOSIS — R4182 Altered mental status, unspecified: Secondary | ICD-10-CM | POA: Diagnosis not present

## 2019-05-06 DIAGNOSIS — I129 Hypertensive chronic kidney disease with stage 1 through stage 4 chronic kidney disease, or unspecified chronic kidney disease: Secondary | ICD-10-CM | POA: Diagnosis not present

## 2019-05-06 NOTE — Telephone Encounter (Signed)
Wanting to know status of verbal orders. Please advise

## 2019-05-06 NOTE — Telephone Encounter (Signed)
Verbal given 

## 2019-05-07 ENCOUNTER — Other Ambulatory Visit: Payer: Self-pay

## 2019-05-07 ENCOUNTER — Telehealth: Payer: Self-pay | Admitting: Family Medicine

## 2019-05-07 ENCOUNTER — Ambulatory Visit (INDEPENDENT_AMBULATORY_CARE_PROVIDER_SITE_OTHER): Payer: Medicare Other | Admitting: Family Medicine

## 2019-05-07 ENCOUNTER — Encounter: Payer: Self-pay | Admitting: Family Medicine

## 2019-05-07 VITALS — BP 130/82 | HR 71 | Temp 98.2°F | Ht 70.5 in | Wt 142.0 lb

## 2019-05-07 DIAGNOSIS — M810 Age-related osteoporosis without current pathological fracture: Secondary | ICD-10-CM | POA: Diagnosis not present

## 2019-05-07 DIAGNOSIS — Z9981 Dependence on supplemental oxygen: Secondary | ICD-10-CM | POA: Diagnosis not present

## 2019-05-07 DIAGNOSIS — L2084 Intrinsic (allergic) eczema: Secondary | ICD-10-CM | POA: Diagnosis not present

## 2019-05-07 DIAGNOSIS — J984 Other disorders of lung: Secondary | ICD-10-CM | POA: Diagnosis not present

## 2019-05-07 DIAGNOSIS — R4182 Altered mental status, unspecified: Secondary | ICD-10-CM

## 2019-05-07 DIAGNOSIS — I4891 Unspecified atrial fibrillation: Secondary | ICD-10-CM | POA: Diagnosis not present

## 2019-05-07 DIAGNOSIS — J986 Disorders of diaphragm: Secondary | ICD-10-CM

## 2019-05-07 DIAGNOSIS — Z8673 Personal history of transient ischemic attack (TIA), and cerebral infarction without residual deficits: Secondary | ICD-10-CM | POA: Diagnosis not present

## 2019-05-07 DIAGNOSIS — G3184 Mild cognitive impairment, so stated: Secondary | ICD-10-CM | POA: Diagnosis not present

## 2019-05-07 DIAGNOSIS — I129 Hypertensive chronic kidney disease with stage 1 through stage 4 chronic kidney disease, or unspecified chronic kidney disease: Secondary | ICD-10-CM | POA: Diagnosis not present

## 2019-05-07 MED ORDER — QVAR REDIHALER 40 MCG/ACT IN AERB: 1.0000 | INHALATION_SPRAY | Freq: Two times a day (BID) | RESPIRATORY_TRACT | 3 refills | Status: DC

## 2019-05-07 MED ORDER — TRAZODONE HCL 50 MG PO TABS: 50.0000 mg | ORAL_TABLET | Freq: Every day | ORAL | 1 refills | Status: DC

## 2019-05-07 NOTE — Telephone Encounter (Signed)
Peter from [facility] stating he could not get the PT evaluation scheduled with patient for today and will have to move the evaluation till next week.  Peter call back [phone]

## 2019-05-07 NOTE — Progress Notes (Signed)
7/10/20205:48 PM  [name] [birth year], 85 y.o., male [id number]  Chief Complaint  Patient presents with  . Altered Mental Status    hospital follow up, having some balance issues, other tham that says he feels fine. Saw speech therapist per care giver [name] says all went well with speech and memory  . Medication Refill    refill on trazodone and qvar    HPI:   Patient is a 85 y.o. male with past medical history significant for HTN, asthma, BPH, CKD, HLP, CVA, osteoporosiswho presents today for hosp followup  hosp from July 2-7 2020 for delirium, negative workup other than nocturnal hypoxia 2/2 restrictive lung disease from hemidiaphragm.   Caregiver helps to provide history Patient states that he is felling well and has no concerns other than his eczema Caregiver reports that patient had a "miracoulous" turn around with trazodone and nocturnal oxygen 2L via nasal cannula Sleeping well, no disturbance, not snoring Sleeping in hospital bed at incline Mentation is back to normal Urinating better, having improved control Has bedside commode Using walker Home health is coming out, nurse, PT and speech O[facility] is provided by [facility] Has appt with Dr Leonie Man, neuro, next week  Continues to have significant dry skin and itchiness Using triamcinolone with eucerin three times a week after he bathes Currently exacerbated by sun burns from recent beach trip Long standing h/o eczema  Depression screen [facility] 2/9 05/07/2019 04/27/2019 04/01/2019  Decreased Interest 0 0 0  Down, Depressed, Hopeless 0 0 0  PHQ - 2 Score 0 0 0  Altered sleeping - - -  Tired, decreased energy - - -  Change in appetite - - -  Feeling bad or failure about yourself  - - -  Trouble concentrating - - -  Moving slowly or fidgety/restless - - -  Suicidal thoughts - - -  PHQ-9 Score - - -  Difficult doing work/chores - - -    Fall Risk  05/07/2019 04/27/2019 04/01/2019 03/23/2019 02/03/2019  Falls in the  past year? 0 _0 0  Comment - - - - -  Number falls in past yr: 0 1 0 0 -  Injury with Fall? 0 1 1 0 0  Comment - bruising due to altered mental status, pt fine 3 weeks ago per daughter - - -  Risk Factor Category  - - - - -  Risk for fall due to : - - - - -  Follow up - Falls evaluation completed Falls evaluation completed - -     Allergies  Allergen Reactions  . Other Shortness Of Breath, Itching and Other (See Comments)    Seasonal allergies- Runny nose, congestion, itchy eyes, and wheezing    Prior to Admission medications   Medication Sig Start Date End Date Taking? Authorizing Provider  acetaminophen (TYLENOL) 500 MG tablet Take 500-1,000 mg by mouth every 6 (six) hours as needed (for headaches).   Yes [provider]  albuterol (PROVENTIL HFA;VENTOLIN HFA) 108 (90 Base) MCG/ACT inhaler Inhale 2 puffs into the lungs every 6 (six) hours as needed for wheezing or shortness of breath. 09/29/18  Yes Forrest Moron, MD  alendronate (FOSAMAX) 70 MG tablet Take 1 tablet (70 mg total) by mouth every 7 (seven) days. Take with a full glass of water on an empty stomach. Patient taking differently: Take 70 mg by mouth every Saturday. Take with a full glass of water on an empty stomach 07/27/18  Yes Pamella Pert,  Lilia Argue, MD  beclomethasone (QVAR REDIHALER) 40 MCG/ACT inhaler Inhale 1 puff into the lungs 2 (two) times daily. 02/05/19  Yes Rutherford Guys, MD  Blood Pressure KIT Please check BP twice a day 03/25/19  Yes Rutherford Guys, MD  clopidogrel (PLAVIX) 75 MG tablet Take 1 tablet (75 mg total) by mouth daily. 05/05/19  Yes Eulogio Bear U, DO  docusate sodium (COLACE) 100 MG capsule Take 1 capsule (100 mg total) by mouth daily. Patient taking differently: Take 100 mg by mouth daily as needed for mild constipation.  03/09/19  Yes Eulogio Bear U, DO  metoprolol succinate (TOPROL-XL) 50 MG 24 hr tablet TAKE 1 TABLET DAILY WITH OR IMMEDIATELY FOLLOWING A MEAL. 05/05/19  Yes Rutherford Guys,  MD  Misc. Devices [facility]) MISC Use walker whenever walking, standard walker 07/27/18  Yes Rutherford Guys, MD  pravastatin (PRAVACHOL) 40 MG tablet TAKE 1 TABLET ONCE DAILY. 05/05/19  Yes Rutherford Guys, MD  senna-docusate (SENOKOT-S) 8.6-50 MG tablet Take 1 tablet by mouth at bedtime as needed for moderate constipation. 03/09/19  Yes Vann, Jessica U, DO  traZODone (DESYREL) 50 MG tablet Take 1 tablet (50 mg total) by mouth at bedtime. 05/04/19  Yes Geradine Girt, DO    Past Medical History:  Diagnosis Date  . Allergy    seasonal  fall  . Cancer ([place])    Basal cell carcinoma; multiple  . Eczema   . Hyperlipidemia   . Hypertension   . Osteoporosis   . Stroke [facility])     Past Surgical History:  Procedure Laterality Date  . CATARACT EXTRACTION, BILATERAL  06/29/2015  . INTRAMEDULLARY (IM) NAIL INTERTROCHANTERIC Left 03/30/2017   Procedure: INTRAMEDULLARY (IM) NAIL INTERTROCHANTRIC;  Surgeon: [name], MD;  Location: [place];  Service: Orthopedics;  Laterality: Left;  . INTRAMEDULLARY (IM) NAIL INTERTROCHANTERIC Right 11/12/2018   Procedure: INTRAMEDULLARY (IM) NAIL RIGHT INTERTROCHANTERIC HIP FRACTURE;  Surgeon: [name], MD;  Location: [place];  Service: Orthopedics;  Laterality: Right;  . MOHS SURGERY    . TONSILLECTOMY  1940 maybe    Social History   Tobacco Use  . Smoking status: Never Smoker  . Smokeless tobacco: Never Used  Substance Use Topics  . Alcohol use: No    Comment: occas    Family History  Problem Relation Age of Onset  . Arthritis Mother   . Heart disease Mother 36  . Hypertension Father     Review of Systems  Constitutional: Negative for chills and fever.  Respiratory: Negative for cough and shortness of breath.   Cardiovascular: Negative for chest pain, palpitations and leg swelling.  Gastrointestinal: Negative for abdominal pain, nausea and vomiting.  per hpi   OBJECTIVE:  Today's Vitals   05/07/19 1730  BP: 130/82  Pulse: 71  Temp:  98.2 F (36.8 C)  TempSrc: Oral  SpO2: 95%  Weight: 142 lb (64.4 kg)  Height: 5' 10.5" (1.791 m)   Body mass index is 20.09 kg/m.   Physical Exam Vitals signs and nursing note reviewed.  Constitutional:      Appearance: He is well-developed.  HENT:     Head: Normocephalic and atraumatic.  Eyes:     Conjunctiva/sclera: Conjunctivae normal.     Pupils: Pupils are equal, round, and reactive to light.  Neck:     Musculoskeletal: Neck supple.  Cardiovascular:     Rate and Rhythm: Normal rate and regular rhythm.     Heart sounds: No murmur. No  friction rub. No gallop.   Pulmonary:     Effort: Pulmonary effort is normal.     Breath sounds: Normal breath sounds. No wheezing or rales.  Skin:    General: Skin is warm and dry.     Findings: Erythema and rash present.  Neurological:     Mental Status: He is alert and oriented to person, place, and time.     ASSESSMENT and PLAN  1. Restrictive lung disease 2. Diaphragm paralysis 3. Dependence on nocturnal oxygen therapy Continue with 2L nocturnal oxygen. Declines referral for sleep study at this time  4. Altered mental status, unspecified altered mental status type Resolved. Has upcoming with neurology.  5. Intrinsic eczema - Ambulatory referral to Dermatology  Other orders - traZODone (DESYREL) 50 MG tablet; Take 1 tablet (50 mg total) by mouth at bedtime. - beclomethasone (QVAR REDIHALER) 40 MCG/ACT inhaler; Inhale 1 puff into the lungs 2 (two) times daily.  Return in about 3 months (around 08/07/2019).    Rutherford Guys, MD Primary [facility] at [place], [place] [address] Ph.  [phone] Fax [phone]

## 2019-05-07 NOTE — Telephone Encounter (Signed)
Pt was advised of results at  [facility] [place] today.

## 2019-05-07 NOTE — Patient Instructions (Signed)
° ° ° °  If you have lab work done today you will be contacted with your lab results within the next 2 weeks.  If you have not heard from us then please contact us. The fastest way to get your results is to register for My Chart. ° ° °IF you received an x-ray today, you will receive an invoice from [facility]. Please contact  Radiology at [phone] with questions or concerns regarding your invoice.  ° °IF you received labwork today, you will receive an invoice from [facility]. Please contact [facility] at [phone] with questions or concerns regarding your invoice.  ° °Our billing staff will not be able to assist you with questions regarding bills from these companies. ° °You will be contacted with the lab results as soon as they are available. The fastest way to get your results is to activate your My Chart account. Instructions are located on the last page of this paperwork. If you have not heard from us regarding the results in 2 weeks, please contact this office. °  ° ° ° °

## 2019-05-07 NOTE — Telephone Encounter (Signed)
Provider was advise

## 2019-05-08 ENCOUNTER — Encounter: Payer: Self-pay | Admitting: Family Medicine

## 2019-05-10 DIAGNOSIS — M810 Age-related osteoporosis without current pathological fracture: Secondary | ICD-10-CM | POA: Diagnosis not present

## 2019-05-10 DIAGNOSIS — I4891 Unspecified atrial fibrillation: Secondary | ICD-10-CM | POA: Diagnosis not present

## 2019-05-10 DIAGNOSIS — G3184 Mild cognitive impairment, so stated: Secondary | ICD-10-CM | POA: Diagnosis not present

## 2019-05-10 DIAGNOSIS — I129 Hypertensive chronic kidney disease with stage 1 through stage 4 chronic kidney disease, or unspecified chronic kidney disease: Secondary | ICD-10-CM | POA: Diagnosis not present

## 2019-05-10 DIAGNOSIS — R4182 Altered mental status, unspecified: Secondary | ICD-10-CM | POA: Diagnosis not present

## 2019-05-10 DIAGNOSIS — Z8673 Personal history of transient ischemic attack (TIA), and cerebral infarction without residual deficits: Secondary | ICD-10-CM | POA: Diagnosis not present

## 2019-05-11 ENCOUNTER — Ambulatory Visit (INDEPENDENT_AMBULATORY_CARE_PROVIDER_SITE_OTHER): Payer: Medicare Other | Admitting: Orthopaedic Surgery

## 2019-05-11 ENCOUNTER — Other Ambulatory Visit: Payer: Self-pay

## 2019-05-11 ENCOUNTER — Ambulatory Visit (INDEPENDENT_AMBULATORY_CARE_PROVIDER_SITE_OTHER): Payer: Medicare Other

## 2019-05-11 ENCOUNTER — Encounter: Payer: Self-pay | Admitting: Orthopaedic Surgery

## 2019-05-11 DIAGNOSIS — S42035D Nondisplaced fracture of lateral end of left clavicle, subsequent encounter for fracture with routine healing: Secondary | ICD-10-CM

## 2019-05-11 DIAGNOSIS — Z8781 Personal history of (healed) traumatic fracture: Secondary | ICD-10-CM | POA: Diagnosis not present

## 2019-05-11 NOTE — Progress Notes (Signed)
Office Visit Note   Patient: [name]           Date of Birth: [birth year]           MRN: [id number] Visit Date: 05/11/2019              Requested by: Rutherford Guys, MD [address] [place],  [place] [address] PCP: Rutherford Guys, MD   Assessment & Plan: Visit Diagnoses:  1. Closed nondisplaced fracture of acromial end of left clavicle with routine healing, subsequent encounter   2. S/P right hip fracture     Plan: Impression is #1 healed intertrochanteric right hip fracture.  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  In regards to the hip, he will advance with activity as tolerated.  In regards to the clavicle fracture, he has no pain and he is very low demand so we will let him advance with activity as tolerated.  He will follow-up with [place] as needed.  Call with concerns or questions in the meantime.  Follow-Up Instructions: Return if symptoms worsen or fail to improve.   Orders:  Orders Placed This Encounter  Procedures  . XR Clavicle Left  . XR FEMUR, MIN 2 VIEWS RIGHT   No orders of the defined types were placed in this encounter.     Procedures: No procedures performed   Clinical Data: No additional findings.   Subjective: Chief Complaint  Patient presents with  . Left Shoulder - Follow-up    HPI patient is a pleasant 85 year old gentleman who presents our clinic today 6 weeks status post right hip IM nail from an intertrochanteric hip fracture, date of surgery 11/12/2018 as well as 7 weeks status post left distal third clavicle fracture.  He has been doing excellent in regards to both.  He is ambulating with a walker which is what he was using prior to the hip fracture.  He has been getting home health physical therapy a few days a week.  He has no complaints of pain to either extremity.  He is here today with his caretaker.  Review of Systems as detailed in HPI.  All others reviewed and are negative.   Objective: Vital Signs: There were no vitals  taken for this visit.  Physical Exam well-developed well-nourished gentleman in no acute distress.  Alert and oriented x3.  Ortho Exam stable exam of the right hip.  Left clavicle shows no tenderness no skin changes.  He is neurovascular intact distally.  Specialty Comments:  No specialty comments available.  Imaging: Xr Clavicle Left  Result Date: 05/11/2019 X-rays of the left ankle reveal nonunion to the distal third  Xr Femur, Min 2 Views Right  Result Date: 05/11/2019 X-rays of the right hip reveal a fully healed intertrochanteric fracture    PMFS History: Patient Active Problem List   Diagnosis Date Noted  . Diaphragm paralysis 05/04/2019  . Restrictive lung disease 05/04/2019  . Altered mental status 04/30/2019  . Confusion 04/30/2019  . Abnormal weight loss 03/23/2019  . Pain in joint of left shoulder 03/23/2019  . Frequent urination 03/23/2019  . Constipation 03/23/2019  . TIA (transient ischemic attack) 03/08/2019  . Hyponatremia 03/08/2019  . Impaired mobility 01/13/2019  . Seasonal allergies 01/13/2019  . Hiatal hernia 01/13/2019  . Pain in right hip 12/24/2018  . S/P right hip fracture 11/11/2018  . Recurrent falls 05/12/2018  . History of CVA (cerebrovascular accident) 01/16/2018  . Nocturnal enuresis   . Fall   . Full  incontinence of feces   . Essential hypertension   . Hypoalbuminemia due to protein-calorie malnutrition ([place])   . Abnormality of gait   . Leg edema   . Hip fracture ([place]) 03/30/2017  . Leukocytosis 03/29/2017  . Mild intermittent asthma with acute exacerbation 01/29/2017  . Chronic renal insufficiency 09/22/2015  . Tachycardia 09/22/2015  . PVC (premature ventricular contraction) 09/22/2015  . Osteoporosis 06/03/2014  . Basal cell carcinoma of face 06/03/2014  . Benign prostatic hyperplasia 05/27/2013  . Eczema 04/23/2012  . Dyslipidemia 04/23/2012   Past Medical History:  Diagnosis Date  . Allergy    seasonal  fall  . Cancer  ([place])    Basal cell carcinoma; multiple  . Eczema   . Hyperlipidemia   . Hypertension   . Osteoporosis   . Stroke [facility])     Family History  Problem Relation Age of Onset  . Arthritis Mother   . Heart disease Mother 34  . Hypertension Father     Past Surgical History:  Procedure Laterality Date  . CATARACT EXTRACTION, BILATERAL  06/29/2015  . INTRAMEDULLARY (IM) NAIL INTERTROCHANTERIC Left 03/30/2017   Procedure: INTRAMEDULLARY (IM) NAIL INTERTROCHANTRIC;  Surgeon: [name], MD;  Location: [place];  Service: Orthopedics;  Laterality: Left;  . INTRAMEDULLARY (IM) NAIL INTERTROCHANTERIC Right 11/12/2018   Procedure: INTRAMEDULLARY (IM) NAIL RIGHT INTERTROCHANTERIC HIP FRACTURE;  Surgeon: [name], MD;  Location: [place];  Service: Orthopedics;  Laterality: Right;  . MOHS SURGERY    . TONSILLECTOMY  1940 maybe   Social History   Occupational History  . Occupation: retired  Tobacco Use  . Smoking status: Never Smoker  . Smokeless tobacco: Never Used  Substance and Sexual Activity  . Alcohol use: No    Comment: occas  . Drug use: No  . Sexual activity: Not Currently

## 2019-05-12 ENCOUNTER — Ambulatory Visit (INDEPENDENT_AMBULATORY_CARE_PROVIDER_SITE_OTHER): Payer: Medicare Other | Admitting: Neurology

## 2019-05-12 ENCOUNTER — Encounter: Payer: Self-pay | Admitting: Neurology

## 2019-05-12 ENCOUNTER — Telehealth: Payer: Self-pay | Admitting: Family Medicine

## 2019-05-12 VITALS — BP 126/59 | HR 66 | Temp 98.7°F | Wt 150.0 lb

## 2019-05-12 DIAGNOSIS — Z8673 Personal history of transient ischemic attack (TIA), and cerebral infarction without residual deficits: Secondary | ICD-10-CM | POA: Diagnosis not present

## 2019-05-12 DIAGNOSIS — F05 Delirium due to known physiological condition: Secondary | ICD-10-CM

## 2019-05-12 DIAGNOSIS — M810 Age-related osteoporosis without current pathological fracture: Secondary | ICD-10-CM | POA: Diagnosis not present

## 2019-05-12 DIAGNOSIS — R4182 Altered mental status, unspecified: Secondary | ICD-10-CM | POA: Diagnosis not present

## 2019-05-12 DIAGNOSIS — I4891 Unspecified atrial fibrillation: Secondary | ICD-10-CM | POA: Diagnosis not present

## 2019-05-12 DIAGNOSIS — G3184 Mild cognitive impairment, so stated: Secondary | ICD-10-CM | POA: Diagnosis not present

## 2019-05-12 DIAGNOSIS — I129 Hypertensive chronic kidney disease with stage 1 through stage 4 chronic kidney disease, or unspecified chronic kidney disease: Secondary | ICD-10-CM | POA: Diagnosis not present

## 2019-05-12 NOTE — Telephone Encounter (Signed)
Copied from [place] [id number]. Topic: General - Other >> May 12, 2019 12:30 PM Yvette Rack wrote: Reason for CRM: Mo with [facility] called to report that patient had a fall yesterday 05/11/19. Cb# [phone]

## 2019-05-12 NOTE — Patient Instructions (Signed)
I had a long d/w patient about his recent TiA, risk for recurrent stroke/TIAs, personally independently reviewed imaging studies and stroke evaluation results and answered questions.Continue Plavix  for secondary stroke prevention and maintain strict control of hypertension with blood pressure goal below 130/90, diabetes with hemoglobin A1c goal below 6.5% and lipids with LDL cholesterol goal below 70 mg/dL. I also advised the patient to eat a healthy diet with plenty of whole grains, cereals, fruits and vegetables, exercise regularly and maintain ideal body weight Followup in the future with my nurse practitioner Janett Billow in 6 months or call earlier if needed

## 2019-05-12 NOTE — Progress Notes (Signed)
[facility] [address] [place]. [place] [address] [phone]       OFFICE FOLLOW-UP NOTE  [name] Date of Birth:  September 17, 1932 Medical Record Number:  [id number]   HPI: [name] is a 85 year old Caucasian male seen today for office follow-up visit following hospital admission for TIA in May 2020.  He is he is accompanied today by his caregiver.  History is obtained from them, review of electronic medical records and have personally reviewed imaging films in PACS.  He has past medical history of hypertension, hyperlipidemia, right hip fracture with some chronic right-sided weakness, remote history of right basal ganglia infarct in March 2019.  He lives at home and requires 24/7 assistance following his hip fracture and has a caregiver.  He presented on 03/07/2019 with sudden onset of confusion, difficulty finding words and being unable to name objects.  Code stroke was activated upon arrival.  CT scan of the head showed no acute abnormality.  CT angiogram of the head and neck were obtained which were negative for emergent large vessel stenosis or occlusion.  There was 60% atherosclerotic plaque in the right carotid bifurcation and 50% on the left.  2D echo showed normal ejection fraction.  MRI scan showed no acute stroke and changes of small vessel disease and mild generalized atrophy.  EEG showed no seizure activity.  LDL cholesterol was 90 mg percent.  Hemoglobin A1c was 5.6.  He was started on aspirin Plavix for 3 weeks followed by Plavix alone.  Patient is done well.  Is living at home.  He is 24-hour care.  He needs help with most activities of daily living.  He is able to walk with a walker.  Is receiving home physical and occupational therapy 2 days a week.  He also has a home health nurse come to help with his medications.  Patient was readmitted to [facility] last week with delirium and confusion.  Urinalysis showed no evidence of UTI and chest x-ray showed no  pneumonia.  Patient was started on trazodone for sleep.  He was also started on oxygen at night for restrictive lung disease and patient and caregiver both state that he is doing much better since then.  His mind is more clear he is more interactive and aware of his surroundings.  Is tolerating Plavix well without bleeding or bruising.  His blood pressures well controlled today it is 126/59.  He remains on Pravachol which is tolerating well without muscle aches and pains.  ROS:   14 system review of systems is positive for decreased hearing, speech difficulty, confusion and shortness of breath and all other systems negative  PMH:  Past Medical History:  Diagnosis Date   Allergy    seasonal  fall   Cancer ([place])    Basal cell carcinoma; multiple   Eczema    Hyperlipidemia    Hypertension    Osteoporosis    Stroke [facility])     Social History:  Social History   Socioeconomic History   Marital status: Married    Spouse name: Not on file   Number of children: 2   Years of education: Not on file   Highest education level: Not on file  Occupational History   Occupation: retired  Scientist, product/process development strain: Not on file   Food insecurity    Worry: Not on file    Inability: Not on file   Transportation needs    Medical: Not on file  Non-medical: Not on file  Tobacco Use   Smoking status: Never Smoker   Smokeless tobacco: Never Used  Substance and Sexual Activity   Alcohol use: No    Comment: occas   Drug use: No   Sexual activity: Not Currently  Lifestyle   Physical activity    Days per week: Not on file    Minutes per session: Not on file   Stress: Not on file  Relationships   Social connections    Talks on phone: Not on file    Gets together: Not on file    Attends religious service: Not on file    Active member of club or organization: Not on file    Attends meetings of clubs or organizations: Not on file    Relationship status:  Not on file   Intimate partner violence    Fear of current or ex partner: Not on file    Emotionally abused: Not on file    Physically abused: Not on file    Forced sexual activity: Not on file  Other Topics Concern   Not on file  Social History Narrative   Marital status: divorced; good friend with ex-wife; not dating      Children: 2 children; 4 grandchildren; no gg      Lives:  Alone in house; daughter six blocks away      Employment: college professor; retired in 1997; history; Engineer, manufacturing      Tobacco:  Never      Alcohol: tequila loves but won't buy it.      Exercise:  As much as can; previous competitive biking.      ADLs: quit mowing grass in 2017; no assistant devices; cooks and cleans and pays bills.  No driving; HAS NEVER DRIVEN; has always ridden bike.  Daughter takes patient to grocery store and to appointments.  Daughter drives patient to grocery store.      Advanced Directives:  No living; DNR; do not resuscitate.  HCPOA: [name] Cummings    Medications:   Current Outpatient Medications on File Prior to Visit  Medication Sig Dispense Refill   acetaminophen (TYLENOL) 500 MG tablet Take 500-1,000 mg by mouth every 6 (six) hours as needed (for headaches).     albuterol (PROVENTIL HFA;VENTOLIN HFA) 108 (90 Base) MCG/ACT inhaler Inhale 2 puffs into the lungs every 6 (six) hours as needed for wheezing or shortness of breath. 1 Inhaler 0   alendronate (FOSAMAX) 70 MG tablet Take 1 tablet (70 mg total) by mouth every 7 (seven) days. Take with a full glass of water on an empty stomach. (Patient taking differently: Take 70 mg by mouth every Saturday. Take with a full glass of water on an empty stomach) 4 tablet 11   beclomethasone (QVAR REDIHALER) 40 MCG/ACT inhaler Inhale 1 puff into the lungs 2 (two) times daily. 10.6 g 3   Blood Pressure KIT Please check BP twice a day 1 each 0   clopidogrel (PLAVIX) 75 MG tablet Take 1 tablet (75 mg total) by mouth daily. 30 tablet 0     docusate sodium (COLACE) 100 MG capsule Take 1 capsule (100 mg total) by mouth daily. (Patient taking differently: Take 100 mg by mouth daily as needed for mild constipation. ) 10 capsule 0   metoprolol succinate (TOPROL-XL) 50 MG 24 hr tablet TAKE 1 TABLET DAILY WITH OR IMMEDIATELY FOLLOWING A MEAL. 90 tablet 0   Misc. Devices [facility]) MISC Use walker whenever walking, standard walker 1 each  0   pravastatin (PRAVACHOL) 40 MG tablet TAKE 1 TABLET ONCE DAILY. 90 tablet 0   senna-docusate (SENOKOT-S) 8.6-50 MG tablet Take 1 tablet by mouth at bedtime as needed for moderate constipation.     traZODone (DESYREL) 50 MG tablet Take 1 tablet (50 mg total) by mouth at bedtime. 90 tablet 1   No current facility-administered medications on file prior to visit.     Allergies:   Allergies  Allergen Reactions   Other Shortness Of Breath, Itching and Other (See Comments)    Seasonal allergies- Runny nose, congestion, itchy eyes, and wheezing    Physical Exam General: Frail elderly Caucasian male, seated, in no evident distress Head: head normocephalic and atraumatic.  Neck: supple with no carotid or supraclavicular bruits Cardiovascular: regular rate and rhythm, no murmurs Musculoskeletal: no deformity Skin:  no rash/petichiae Vascular:  Normal pulses all extremities Vitals:   05/12/19 0942  BP: (!) 126/59  Pulse: 66  Temp: 98.7 F (37.1 C)   Neurologic Exam Mental Status: Awake and fully alert. Oriented to place and time. Recent and remote memory intact. Attention span, concentration and fund of knowledge appropriate. Mood and affect appropriate.  Cranial Nerves: Fundoscopic exam reveals sharp disc margins. Pupils equal, briskly reactive to light. Extraocular movements full without nystagmus. Visual fields full to confrontation. Hearing diminished bilaterally.  Facial sensation intact. Face, tongue, palate moves normally and symmetrically.  Motor: Normal bulk and tone. Normal strength  in all tested extremity muscles. Sensory.: intact to touch ,pinprick .position and vibratory sensation.  Coordination: Rapid alternating movements normal in all extremities. Finger-to-nose and heel-to-shin performed accurately bilaterally. Gait and Station: Unable to assess as patient did not get his walker..  Reflexes: 1+ and symmetric. Toes downgoing.       ASSESSMENT: 85 year old Caucasian male with episode of transient episode of confusion and speech difficulties possibly TIA in May 2020.  EEG negative for seizures prior history of right basal ganglia infarct in March 2019 due to small vessel disease.  Vascular risk factors of hypertension, hyperlipidemia and age.     PLAN: I had a long d/w patient about his recent TiA, risk for recurrent stroke/TIAs, personally independently reviewed imaging studies and stroke evaluation results and answered questions.Continue Plavix  for secondary stroke prevention and maintain strict control of hypertension with blood pressure goal below 130/90, diabetes with hemoglobin A1c goal below 6.5% and lipids with LDL cholesterol goal below 70 mg/dL. I also advised the patient to eat a healthy diet with plenty of whole grains, cereals, fruits and vegetables, exercise regularly and maintain ideal body weight Followup in the future with my nurse practitioner Janett Billow in 6 months or call earlier if needed Greater than 50% of time during this 25 minute visit was spent on counseling,explanation of diagnosis, planning of further management, discussion with patient and family and coordination of care Antony Contras, MD  [facility] [address] [place], [place] [address]  Phone [phone] Fax [phone] Note: This document was prepared with digital dictation and possible smart phrase technology. Any transcriptional errors that result from this process are unintentional

## 2019-05-14 DIAGNOSIS — R4182 Altered mental status, unspecified: Secondary | ICD-10-CM | POA: Diagnosis not present

## 2019-05-14 DIAGNOSIS — G3184 Mild cognitive impairment, so stated: Secondary | ICD-10-CM | POA: Diagnosis not present

## 2019-05-14 DIAGNOSIS — I129 Hypertensive chronic kidney disease with stage 1 through stage 4 chronic kidney disease, or unspecified chronic kidney disease: Secondary | ICD-10-CM | POA: Diagnosis not present

## 2019-05-14 DIAGNOSIS — I4891 Unspecified atrial fibrillation: Secondary | ICD-10-CM | POA: Diagnosis not present

## 2019-05-14 DIAGNOSIS — M810 Age-related osteoporosis without current pathological fracture: Secondary | ICD-10-CM | POA: Diagnosis not present

## 2019-05-14 DIAGNOSIS — Z8673 Personal history of transient ischemic attack (TIA), and cerebral infarction without residual deficits: Secondary | ICD-10-CM | POA: Diagnosis not present

## 2019-05-17 ENCOUNTER — Telehealth: Payer: Self-pay | Admitting: Family Medicine

## 2019-05-17 DIAGNOSIS — I4891 Unspecified atrial fibrillation: Secondary | ICD-10-CM | POA: Diagnosis not present

## 2019-05-17 DIAGNOSIS — R4182 Altered mental status, unspecified: Secondary | ICD-10-CM | POA: Diagnosis not present

## 2019-05-17 DIAGNOSIS — Z8673 Personal history of transient ischemic attack (TIA), and cerebral infarction without residual deficits: Secondary | ICD-10-CM | POA: Diagnosis not present

## 2019-05-17 DIAGNOSIS — I129 Hypertensive chronic kidney disease with stage 1 through stage 4 chronic kidney disease, or unspecified chronic kidney disease: Secondary | ICD-10-CM | POA: Diagnosis not present

## 2019-05-17 DIAGNOSIS — G3184 Mild cognitive impairment, so stated: Secondary | ICD-10-CM | POA: Diagnosis not present

## 2019-05-17 DIAGNOSIS — M810 Age-related osteoporosis without current pathological fracture: Secondary | ICD-10-CM | POA: Diagnosis not present

## 2019-05-17 NOTE — Telephone Encounter (Signed)
Home Health Verbal Orders - Caller/Agency: [facility] Callback Number: [phone] [id number] OT/PT/Skilled Nursing/Social Work/Speech Therapy: PT Frequency: 2x for 3 weeks

## 2019-05-19 NOTE — Telephone Encounter (Signed)
Dr Pamella Pert This patient was last seen here in the office on 05/07/19 had a fall 2 days ago and [facility] would like a verbal order for PT/OT/ skilled nursing. Please advise

## 2019-05-19 NOTE — Telephone Encounter (Signed)
Please call and give verbal orders as requested. Thanks.

## 2019-05-19 NOTE — Telephone Encounter (Signed)
Part of the other msg I just sent you about this patient

## 2019-05-20 DIAGNOSIS — G3184 Mild cognitive impairment, so stated: Secondary | ICD-10-CM | POA: Diagnosis not present

## 2019-05-20 DIAGNOSIS — I129 Hypertensive chronic kidney disease with stage 1 through stage 4 chronic kidney disease, or unspecified chronic kidney disease: Secondary | ICD-10-CM | POA: Diagnosis not present

## 2019-05-20 DIAGNOSIS — I4891 Unspecified atrial fibrillation: Secondary | ICD-10-CM | POA: Diagnosis not present

## 2019-05-20 DIAGNOSIS — M810 Age-related osteoporosis without current pathological fracture: Secondary | ICD-10-CM | POA: Diagnosis not present

## 2019-05-20 DIAGNOSIS — Z8673 Personal history of transient ischemic attack (TIA), and cerebral infarction without residual deficits: Secondary | ICD-10-CM | POA: Diagnosis not present

## 2019-05-20 DIAGNOSIS — R4182 Altered mental status, unspecified: Secondary | ICD-10-CM | POA: Diagnosis not present

## 2019-05-20 NOTE — Telephone Encounter (Signed)
Verbal orders given  

## 2019-05-21 DIAGNOSIS — I4891 Unspecified atrial fibrillation: Secondary | ICD-10-CM | POA: Diagnosis not present

## 2019-05-21 DIAGNOSIS — Z8673 Personal history of transient ischemic attack (TIA), and cerebral infarction without residual deficits: Secondary | ICD-10-CM | POA: Diagnosis not present

## 2019-05-21 DIAGNOSIS — I129 Hypertensive chronic kidney disease with stage 1 through stage 4 chronic kidney disease, or unspecified chronic kidney disease: Secondary | ICD-10-CM | POA: Diagnosis not present

## 2019-05-21 DIAGNOSIS — M810 Age-related osteoporosis without current pathological fracture: Secondary | ICD-10-CM | POA: Diagnosis not present

## 2019-05-21 DIAGNOSIS — R4182 Altered mental status, unspecified: Secondary | ICD-10-CM | POA: Diagnosis not present

## 2019-05-21 DIAGNOSIS — G3184 Mild cognitive impairment, so stated: Secondary | ICD-10-CM | POA: Diagnosis not present

## 2019-05-24 DIAGNOSIS — R4182 Altered mental status, unspecified: Secondary | ICD-10-CM | POA: Diagnosis not present

## 2019-05-24 DIAGNOSIS — G3184 Mild cognitive impairment, so stated: Secondary | ICD-10-CM | POA: Diagnosis not present

## 2019-05-24 DIAGNOSIS — I4891 Unspecified atrial fibrillation: Secondary | ICD-10-CM | POA: Diagnosis not present

## 2019-05-24 DIAGNOSIS — M810 Age-related osteoporosis without current pathological fracture: Secondary | ICD-10-CM | POA: Diagnosis not present

## 2019-05-24 DIAGNOSIS — I129 Hypertensive chronic kidney disease with stage 1 through stage 4 chronic kidney disease, or unspecified chronic kidney disease: Secondary | ICD-10-CM | POA: Diagnosis not present

## 2019-05-24 DIAGNOSIS — Z8673 Personal history of transient ischemic attack (TIA), and cerebral infarction without residual deficits: Secondary | ICD-10-CM | POA: Diagnosis not present

## 2019-05-25 DIAGNOSIS — Z8673 Personal history of transient ischemic attack (TIA), and cerebral infarction without residual deficits: Secondary | ICD-10-CM | POA: Diagnosis not present

## 2019-05-25 DIAGNOSIS — M810 Age-related osteoporosis without current pathological fracture: Secondary | ICD-10-CM | POA: Diagnosis not present

## 2019-05-25 DIAGNOSIS — R4182 Altered mental status, unspecified: Secondary | ICD-10-CM | POA: Diagnosis not present

## 2019-05-25 DIAGNOSIS — G3184 Mild cognitive impairment, so stated: Secondary | ICD-10-CM | POA: Diagnosis not present

## 2019-05-25 DIAGNOSIS — I4891 Unspecified atrial fibrillation: Secondary | ICD-10-CM | POA: Diagnosis not present

## 2019-05-25 DIAGNOSIS — I129 Hypertensive chronic kidney disease with stage 1 through stage 4 chronic kidney disease, or unspecified chronic kidney disease: Secondary | ICD-10-CM | POA: Diagnosis not present

## 2019-05-26 DIAGNOSIS — G3184 Mild cognitive impairment, so stated: Secondary | ICD-10-CM | POA: Diagnosis not present

## 2019-05-26 DIAGNOSIS — I129 Hypertensive chronic kidney disease with stage 1 through stage 4 chronic kidney disease, or unspecified chronic kidney disease: Secondary | ICD-10-CM | POA: Diagnosis not present

## 2019-05-26 DIAGNOSIS — I4891 Unspecified atrial fibrillation: Secondary | ICD-10-CM | POA: Diagnosis not present

## 2019-05-26 DIAGNOSIS — M810 Age-related osteoporosis without current pathological fracture: Secondary | ICD-10-CM | POA: Diagnosis not present

## 2019-05-26 DIAGNOSIS — Z8673 Personal history of transient ischemic attack (TIA), and cerebral infarction without residual deficits: Secondary | ICD-10-CM | POA: Diagnosis not present

## 2019-05-26 DIAGNOSIS — R4182 Altered mental status, unspecified: Secondary | ICD-10-CM | POA: Diagnosis not present

## 2019-05-31 DIAGNOSIS — M810 Age-related osteoporosis without current pathological fracture: Secondary | ICD-10-CM | POA: Diagnosis not present

## 2019-05-31 DIAGNOSIS — I4891 Unspecified atrial fibrillation: Secondary | ICD-10-CM | POA: Diagnosis not present

## 2019-05-31 DIAGNOSIS — I129 Hypertensive chronic kidney disease with stage 1 through stage 4 chronic kidney disease, or unspecified chronic kidney disease: Secondary | ICD-10-CM | POA: Diagnosis not present

## 2019-05-31 DIAGNOSIS — G3184 Mild cognitive impairment, so stated: Secondary | ICD-10-CM | POA: Diagnosis not present

## 2019-05-31 DIAGNOSIS — R4182 Altered mental status, unspecified: Secondary | ICD-10-CM | POA: Diagnosis not present

## 2019-05-31 DIAGNOSIS — Z8673 Personal history of transient ischemic attack (TIA), and cerebral infarction without residual deficits: Secondary | ICD-10-CM | POA: Diagnosis not present

## 2019-06-01 DIAGNOSIS — Z8673 Personal history of transient ischemic attack (TIA), and cerebral infarction without residual deficits: Secondary | ICD-10-CM | POA: Diagnosis not present

## 2019-06-01 DIAGNOSIS — G3184 Mild cognitive impairment, so stated: Secondary | ICD-10-CM | POA: Diagnosis not present

## 2019-06-01 DIAGNOSIS — I129 Hypertensive chronic kidney disease with stage 1 through stage 4 chronic kidney disease, or unspecified chronic kidney disease: Secondary | ICD-10-CM | POA: Diagnosis not present

## 2019-06-01 DIAGNOSIS — R4182 Altered mental status, unspecified: Secondary | ICD-10-CM | POA: Diagnosis not present

## 2019-06-01 DIAGNOSIS — I4891 Unspecified atrial fibrillation: Secondary | ICD-10-CM | POA: Diagnosis not present

## 2019-06-01 DIAGNOSIS — M810 Age-related osteoporosis without current pathological fracture: Secondary | ICD-10-CM | POA: Diagnosis not present

## 2019-06-02 DIAGNOSIS — M810 Age-related osteoporosis without current pathological fracture: Secondary | ICD-10-CM | POA: Diagnosis not present

## 2019-06-02 DIAGNOSIS — R4182 Altered mental status, unspecified: Secondary | ICD-10-CM | POA: Diagnosis not present

## 2019-06-02 DIAGNOSIS — I4891 Unspecified atrial fibrillation: Secondary | ICD-10-CM | POA: Diagnosis not present

## 2019-06-02 DIAGNOSIS — G3184 Mild cognitive impairment, so stated: Secondary | ICD-10-CM | POA: Diagnosis not present

## 2019-06-02 DIAGNOSIS — I129 Hypertensive chronic kidney disease with stage 1 through stage 4 chronic kidney disease, or unspecified chronic kidney disease: Secondary | ICD-10-CM | POA: Diagnosis not present

## 2019-06-02 DIAGNOSIS — Z8673 Personal history of transient ischemic attack (TIA), and cerebral infarction without residual deficits: Secondary | ICD-10-CM | POA: Diagnosis not present

## 2019-06-03 ENCOUNTER — Other Ambulatory Visit: Payer: Self-pay | Admitting: Family Medicine

## 2019-06-03 NOTE — Telephone Encounter (Signed)
Pt was prescribed clopidogrel (PLAVIX) 75 MG tablet On 7.7.20 at the hospital and needs a refill to be written by [name]. Pt is out of medication and needs for tomorrow/ please advise

## 2019-06-07 ENCOUNTER — Other Ambulatory Visit: Payer: Self-pay | Admitting: Family Medicine

## 2019-06-07 NOTE — Telephone Encounter (Signed)
Requested Prescriptions  Pending Prescriptions Disp Refills  . clopidogrel (PLAVIX) 75 MG tablet [Pharmacy Med Name: CLOPIDOGREL 75 MG TABLET] 90 tablet 0    Sig: TAKE 1 TABLET ONCE DAILY.     Hematology: Antiplatelets - clopidogrel Failed - 06/07/2019 11:23 AM      Failed - Evaluate AST, ALT within 2 months of therapy initiation.      Passed - ALT in normal range and within 360 days    ALT  Date Value Ref Range Status  04/30/2019 13 0 - 44 U/L Final         Passed - AST in normal range and within 360 days    AST  Date Value Ref Range Status  04/30/2019 20 15 - 41 U/L Final         Passed - HCT in normal range and within 180 days    HCT  Date Value Ref Range Status  04/30/2019 42.7 39.0 - 52.0 % Final   Hematocrit  Date Value Ref Range Status  03/11/2018 43.6 37.5 - 51.0 % Final         Passed - HGB in normal range and within 180 days    Hemoglobin  Date Value Ref Range Status  04/30/2019 14.9 13.0 - 17.0 g/dL Final  03/11/2018 14.3 13.0 - 17.7 g/dL Final         Passed - PLT in normal range and within 180 days    Platelets  Date Value Ref Range Status  04/30/2019 216 150 - 400 K/uL Final  03/11/2018 198 150 - 379 x10E3/uL Final   Platelet Count, POC  Date Value Ref Range Status  04/27/2019 226 142 - 424 K/uL Final         Passed - Valid encounter within last 6 months    Recent Outpatient Visits          1 month ago Restrictive lung disease   [facility], Lilia Argue, MD   1 month ago Disorientation   Primary Care at [facility], Arlie Solomons, MD   2 months ago Essential hypertension   [facility], Lilia Argue, MD   2 months ago Pain in joint of left shoulder   [facility] Care at [facility], Rex Kras, MD   4 months ago History of recent pneumonia   [facility], Lilia Argue, MD      Future Appointments            In 2 months Rutherford Guys, MD Primary Care at [place], [facility]

## 2019-06-16 ENCOUNTER — Ambulatory Visit: Payer: Medicare Other | Admitting: Podiatry

## 2019-07-01 NOTE — Progress Notes (Signed)
Established Patient Office Visit  Subjective:  Patient ID: [name], male    DOB: 1932-07-22  Age: 85 y.o. MRN: [id number]  CC:  Chief Complaint  Patient presents with  . headaches  and mental confusion x 1 week, and per daughter (    difficulty focusing and not asking for help and trying to do things alone and is falling  . Medication Refill    albuterol inhaler, metoprolol and pravachol    HPI Keison Glendinning presents for   Patient has been appearing more sleepy, has been falling and has difficulty focusing.  He is taking longer to answer questions.  He is here with his daughter.  He is not asking for help with ADL or IADLs but seems less stable. This started a week ago No acute illness No facial droop He had an appointment with [name] but they moved his appointment to an earlier visit to try to get some workup done because he just seems more unsteady daily and weaker with his balance. He is eating, drinking and toileting without change.   Risk factors History of stroke, hypertension, hyperlipidemia   Past Medical History:  Diagnosis Date  . Allergy    seasonal  fall  . Cancer ([place])    Basal cell carcinoma; multiple  . Eczema   . Hyperlipidemia   . Hypertension   . Osteoporosis   . Stroke [facility])     Past Surgical History:  Procedure Laterality Date  . CATARACT EXTRACTION, BILATERAL  06/29/2015  . INTRAMEDULLARY (IM) NAIL INTERTROCHANTERIC Left 03/30/2017   Procedure: INTRAMEDULLARY (IM) NAIL INTERTROCHANTRIC;  Surgeon: [name], MD;  Location: [facility];  Service: Orthopedics;  Laterality: Left;  . INTRAMEDULLARY (IM) NAIL INTERTROCHANTERIC Right 11/12/2018   Procedure: INTRAMEDULLARY (IM) NAIL RIGHT INTERTROCHANTERIC HIP FRACTURE;  Surgeon: [name], MD;  Location: [facility];  Service: Orthopedics;  Laterality: Right;  . MOHS SURGERY    . TONSILLECTOMY  1940 maybe    Family History  Problem Relation Age of Onset  . Arthritis Mother   . Heart disease  Mother 61  . Hypertension Father     Social History   Socioeconomic History  . Marital status: Married    Spouse name: Not on file  . Number of children: 2  . Years of education: Not on file  . Highest education level: Not on file  Occupational History  . Occupation: retired  Scientific laboratory technician  . Financial resource strain: Not on file  . Food insecurity    Worry: Not on file    Inability: Not on file  . Transportation needs    Medical: Not on file    Non-medical: Not on file  Tobacco Use  . Smoking status: Never Smoker  . Smokeless tobacco: Never Used  Substance and Sexual Activity  . Alcohol use: No    Comment: occas  . Drug use: No  . Sexual activity: Not Currently  Lifestyle  . Physical activity    Days per week: Not on file    Minutes per session: Not on file  . Stress: Not on file  Relationships  . Social Herbalist on phone: Not on file    Gets together: Not on file    Attends religious service: Not on file    Active member of club or organization: Not on file    Attends meetings of clubs or organizations: Not on file    Relationship status: Not on file  .  Intimate partner violence    Fear of current or ex partner: Not on file    Emotionally abused: Not on file    Physically abused: Not on file    Forced sexual activity: Not on file  Other Topics Concern  . Not on file  Social History Narrative   Marital status: divorced; good friend with ex-wife; not dating      Children: 2 children; 4 grandchildren; no gg      Lives:  Alone in house; daughter six blocks away      Employment: college professor; retired in 1997; history; [place]:  Never      Alcohol: tequila loves but won't buy it.      Exercise:  As much as can; previous competitive biking.      ADLs: quit mowing grass in 2017; no assistant devices; cooks and cleans and pays bills.  No driving; HAS NEVER DRIVEN; has always ridden bike.  Daughter takes patient to grocery  store and to appointments.  Daughter drives patient to grocery store.      Advanced Directives:  No living; DNR; do not resuscitate.  HCPOA: [name] Cummings    Outpatient Medications Prior to Visit  Medication Sig Dispense Refill  . albuterol (PROVENTIL HFA;VENTOLIN HFA) 108 (90 Base) MCG/ACT inhaler Inhale 2 puffs into the lungs every 6 (six) hours as needed for wheezing or shortness of breath. 1 Inhaler 0  . alendronate (FOSAMAX) 70 MG tablet Take 1 tablet (70 mg total) by mouth every 7 (seven) days. Take with a full glass of water on an empty stomach. (Patient taking differently: Take 70 mg by mouth every Saturday. Take with a full glass of water on an empty stomach) 4 tablet 11  . Blood Pressure KIT Please check BP twice a day 1 each 0  . docusate sodium (COLACE) 100 MG capsule Take 1 capsule (100 mg total) by mouth daily. (Patient taking differently: Take 100 mg by mouth daily as needed for mild constipation. ) 10 capsule 0  . Misc. Devices (WALKER) MISC Use walker whenever walking, standard walker 1 each 0  . senna-docusate (SENOKOT-S) 8.6-50 MG tablet Take 1 tablet by mouth at bedtime as needed for moderate constipation.    . beclomethasone (QVAR REDIHALER) 40 MCG/ACT inhaler Inhale 1 puff into the lungs 2 (two) times daily. 1 Inhaler 2  . metoprolol succinate (TOPROL-XL) 50 MG 24 hr tablet Take 1 tablet (50 mg total) by mouth daily. Take with or immediately following a meal. (Patient taking differently: Take 50 mg by mouth daily. Take with or immediately following a meal) 90 tablet 1  . pravastatin (PRAVACHOL) 40 MG tablet Take 1 tablet (40 mg total) by mouth daily. 90 tablet 1  . clopidogrel (PLAVIX) 75 MG tablet Take 1 tablet (75 mg total) by mouth daily. (Patient not taking: Reported on 04/27/2019) 30 tablet 0   No facility-administered medications prior to visit.     Allergies  Allergen Reactions  . Other Shortness Of Breath, Itching and Other (See Comments)    Seasonal allergies-  Runny nose, congestion, itchy eyes, and wheezing    ROS Review of Systems Review of Systems  Constitutional: Negative for activity change, appetite change, chills and fever.  HENT: Negative for congestion, nosebleeds, trouble swallowing and voice change.   Respiratory: Negative for cough, shortness of breath and wheezing.   Gastrointestinal: Negative for diarrhea, nausea and vomiting.  Genitourinary: Negative for difficulty urinating, dysuria, flank pain and  hematuria.  Musculoskeletal: Negative for back pain, joint swelling and neck pain.  Neurological: see hpi  See HPI. All other review of systems negative.     Objective:    Physical Exam  BP (!) 146/75 (BP Location: Left Arm, Patient Position: Sitting, Cuff Size: Normal)   Pulse 95   Temp 98.5 F (36.9 C) (Oral)   Resp 17   Ht 5' 10.5" (1.791 m)   Wt 135 lb 9.6 oz (61.5 kg)   SpO2 96%   BMI 19.18 kg/m  Wt Readings from Last 3 Encounters:  05/12/19 150 lb (68 kg)  05/07/19 142 lb (64.4 kg)  05/04/19 140 lb 10.5 oz (63.8 kg)    Physical Exam  Constitutional: Oriented to self and place. Slow to respond. Pleasant male. Appears unsteady when he first stands up from a seated position.  HENT:  Head: Normocephalic and atraumatic.  Eyes: Conjunctivae and EOM are normal.  Cardiovascular: Normal rate, regular rhythm, normal heart sounds and intact distal pulses.  No murmur heard. Pulmonary/Chest: Effort normal and breath sounds normal. No stridor. No respiratory distress. Has no wheezes.  Neurological: Is alert and oriented to person, place, and time.  Skin: Skin is warm. Capillary refill takes less than 2 seconds.  Psychiatric: Has a normal mood and affect and if very pleasant. Behavior is normal. Judgment and thought content normal.    ECG: Sinus rhythm, PAC, LAE   Health Maintenance Due  Topic Date Due  . INFLUENZA VACCINE  05/29/2019    There are no preventive care reminders to display for this patient.  Lab  Results  Component Value Date   TSH 1.624 04/29/2019   Lab Results  Component Value Date   WBC 8.5 04/30/2019   HGB 14.9 04/30/2019   HCT 42.7 04/30/2019   MCV 91.2 04/30/2019   PLT 216 04/30/2019   Lab Results  Component Value Date   NA 133 (L) 04/30/2019   K 3.9 04/30/2019   CO2 28 04/30/2019   GLUCOSE 100 (H) 04/30/2019   BUN 10 04/30/2019   CREATININE 1.05 04/30/2019   BILITOT 1.1 04/30/2019   ALKPHOS 76 04/30/2019   AST 20 04/30/2019   ALT 13 04/30/2019   PROT 6.7 04/30/2019   ALBUMIN 3.5 04/30/2019   CALCIUM 9.0 04/30/2019   ANIONGAP 10 04/30/2019   Lab Results  Component Value Date   CHOL 153 03/08/2019   Lab Results  Component Value Date   HDL 66 03/08/2019   Lab Results  Component Value Date   LDLCALC 80 03/08/2019   Lab Results  Component Value Date   TRIG 36 03/08/2019   Lab Results  Component Value Date   CHOLHDL 2.3 03/08/2019   Lab Results  Component Value Date   HGBA1C 5.6 03/08/2019      Assessment & Plan:   Problem List Items Addressed This Visit      Cardiovascular and Mediastinum   TIA (transient ischemic attack)    Other Visit Diagnoses    Disorientation    -  Primary Pt unable to void. No leukocytosis  Will send for MRI and labs   Relevant Orders   MR Brain W Wo Contrast (Completed)   POCT CBC (Completed)   TSH (Completed)   Vitamin B12 (Completed)   Basic metabolic panel (Completed)   Ambulatory referral to Neurology   Memory loss    -  Due to previous history of stroke will get STAT MRI and labs Follow up with [name] to discuss  results   Relevant Orders   MR Brain W Wo Contrast (Completed)   TSH (Completed)   Vitamin B12 (Completed)   Basic metabolic panel (Completed)   Ambulatory referral to Neurology   Falls frequently    -  Discussed that they should take fall precautions at home.  Will refer to Neurology Labs are pending   Relevant Orders   EKG 12-Lead (Completed)   Ambulatory referral to Neurology       No orders of the defined types were placed in this encounter.   Follow-up: No follow-ups on file.   A total of 35 minutes were spent face-to-face with the patient during this encounter and over half of that time was spent on counseling and coordination of care.  Forrest Moron, MD

## 2019-07-14 ENCOUNTER — Inpatient Hospital Stay (HOSPITAL_COMMUNITY): Payer: Medicare Other

## 2019-07-14 ENCOUNTER — Emergency Department (HOSPITAL_COMMUNITY): Payer: Medicare Other

## 2019-07-14 ENCOUNTER — Encounter (HOSPITAL_COMMUNITY): Payer: Self-pay | Admitting: Internal Medicine

## 2019-07-14 ENCOUNTER — Other Ambulatory Visit: Payer: Self-pay

## 2019-07-14 ENCOUNTER — Inpatient Hospital Stay (HOSPITAL_COMMUNITY)
Admission: EM | Admit: 2019-07-14 | Discharge: 2019-07-21 | DRG: 291 | Disposition: A | Discharge status: Home / Self Care | Payer: Medicare Other | Attending: Internal Medicine | Admitting: Internal Medicine

## 2019-07-14 DIAGNOSIS — K449 Diaphragmatic hernia without obstruction or gangrene: Secondary | ICD-10-CM | POA: Diagnosis present

## 2019-07-14 DIAGNOSIS — N179 Acute kidney failure, unspecified: Secondary | ICD-10-CM | POA: Diagnosis present

## 2019-07-14 DIAGNOSIS — Z9841 Cataract extraction status, right eye: Secondary | ICD-10-CM | POA: Diagnosis not present

## 2019-07-14 DIAGNOSIS — L309 Dermatitis, unspecified: Secondary | ICD-10-CM | POA: Diagnosis present

## 2019-07-14 DIAGNOSIS — J984 Other disorders of lung: Secondary | ICD-10-CM | POA: Diagnosis not present

## 2019-07-14 DIAGNOSIS — G4733 Obstructive sleep apnea (adult) (pediatric): Secondary | ICD-10-CM | POA: Diagnosis present

## 2019-07-14 DIAGNOSIS — Z9981 Dependence on supplemental oxygen: Secondary | ICD-10-CM

## 2019-07-14 DIAGNOSIS — J302 Other seasonal allergic rhinitis: Secondary | ICD-10-CM | POA: Diagnosis present

## 2019-07-14 DIAGNOSIS — Z79899 Other long term (current) drug therapy: Secondary | ICD-10-CM

## 2019-07-14 DIAGNOSIS — I5033 Acute on chronic diastolic (congestive) heart failure: Secondary | ICD-10-CM | POA: Diagnosis present

## 2019-07-14 DIAGNOSIS — I499 Cardiac arrhythmia, unspecified: Secondary | ICD-10-CM | POA: Diagnosis not present

## 2019-07-14 DIAGNOSIS — Z66 Do not resuscitate: Secondary | ICD-10-CM | POA: Diagnosis present

## 2019-07-14 DIAGNOSIS — R600 Localized edema: Secondary | ICD-10-CM | POA: Diagnosis present

## 2019-07-14 DIAGNOSIS — M7989 Other specified soft tissue disorders: Secondary | ICD-10-CM | POA: Diagnosis not present

## 2019-07-14 DIAGNOSIS — R7881 Bacteremia: Secondary | ICD-10-CM | POA: Diagnosis present

## 2019-07-14 DIAGNOSIS — Z9842 Cataract extraction status, left eye: Secondary | ICD-10-CM | POA: Diagnosis not present

## 2019-07-14 DIAGNOSIS — R0602 Shortness of breath: Secondary | ICD-10-CM | POA: Diagnosis not present

## 2019-07-14 DIAGNOSIS — J45901 Unspecified asthma with (acute) exacerbation: Secondary | ICD-10-CM | POA: Diagnosis present

## 2019-07-14 DIAGNOSIS — J189 Pneumonia, unspecified organism: Secondary | ICD-10-CM | POA: Diagnosis not present

## 2019-07-14 DIAGNOSIS — E041 Nontoxic single thyroid nodule: Secondary | ICD-10-CM | POA: Diagnosis not present

## 2019-07-14 DIAGNOSIS — E079 Disorder of thyroid, unspecified: Secondary | ICD-10-CM | POA: Diagnosis present

## 2019-07-14 DIAGNOSIS — Z20828 Contact with and (suspected) exposure to other viral communicable diseases: Secondary | ICD-10-CM | POA: Diagnosis present

## 2019-07-14 DIAGNOSIS — IMO0002 Reserved for concepts with insufficient information to code with codable children: Secondary | ICD-10-CM

## 2019-07-14 DIAGNOSIS — I361 Nonrheumatic tricuspid (valve) insufficiency: Secondary | ICD-10-CM | POA: Diagnosis not present

## 2019-07-14 DIAGNOSIS — J9601 Acute respiratory failure with hypoxia: Secondary | ICD-10-CM | POA: Diagnosis present

## 2019-07-14 DIAGNOSIS — I350 Nonrheumatic aortic (valve) stenosis: Secondary | ICD-10-CM | POA: Diagnosis not present

## 2019-07-14 DIAGNOSIS — N182 Chronic kidney disease, stage 2 (mild): Secondary | ICD-10-CM | POA: Diagnosis present

## 2019-07-14 DIAGNOSIS — M4854XD Collapsed vertebra, not elsewhere classified, thoracic region, subsequent encounter for fracture with routine healing: Secondary | ICD-10-CM | POA: Diagnosis not present

## 2019-07-14 DIAGNOSIS — R0902 Hypoxemia: Secondary | ICD-10-CM

## 2019-07-14 DIAGNOSIS — L539 Erythematous condition, unspecified: Secondary | ICD-10-CM | POA: Diagnosis present

## 2019-07-14 DIAGNOSIS — Z85828 Personal history of other malignant neoplasm of skin: Secondary | ICD-10-CM

## 2019-07-14 DIAGNOSIS — I081 Rheumatic disorders of both mitral and tricuspid valves: Secondary | ICD-10-CM | POA: Diagnosis not present

## 2019-07-14 DIAGNOSIS — L2084 Intrinsic (allergic) eczema: Secondary | ICD-10-CM | POA: Diagnosis not present

## 2019-07-14 DIAGNOSIS — E785 Hyperlipidemia, unspecified: Secondary | ICD-10-CM | POA: Diagnosis present

## 2019-07-14 DIAGNOSIS — J9811 Atelectasis: Secondary | ICD-10-CM | POA: Diagnosis present

## 2019-07-14 DIAGNOSIS — I10 Essential (primary) hypertension: Secondary | ICD-10-CM | POA: Diagnosis not present

## 2019-07-14 DIAGNOSIS — B9561 Methicillin susceptible Staphylococcus aureus infection as the cause of diseases classified elsewhere: Secondary | ICD-10-CM | POA: Diagnosis present

## 2019-07-14 DIAGNOSIS — R609 Edema, unspecified: Secondary | ICD-10-CM

## 2019-07-14 DIAGNOSIS — M810 Age-related osteoporosis without current pathological fracture: Secondary | ICD-10-CM | POA: Diagnosis present

## 2019-07-14 DIAGNOSIS — Z7902 Long term (current) use of antithrombotics/antiplatelets: Secondary | ICD-10-CM

## 2019-07-14 DIAGNOSIS — I130 Hypertensive heart and chronic kidney disease with heart failure and stage 1 through stage 4 chronic kidney disease, or unspecified chronic kidney disease: Principal | ICD-10-CM | POA: Diagnosis present

## 2019-07-14 DIAGNOSIS — I340 Nonrheumatic mitral (valve) insufficiency: Secondary | ICD-10-CM | POA: Diagnosis not present

## 2019-07-14 DIAGNOSIS — J4521 Mild intermittent asthma with (acute) exacerbation: Secondary | ICD-10-CM | POA: Diagnosis not present

## 2019-07-14 DIAGNOSIS — D72829 Elevated white blood cell count, unspecified: Secondary | ICD-10-CM | POA: Diagnosis not present

## 2019-07-14 DIAGNOSIS — I351 Nonrheumatic aortic (valve) insufficiency: Secondary | ICD-10-CM | POA: Diagnosis not present

## 2019-07-14 DIAGNOSIS — Z8673 Personal history of transient ischemic attack (TIA), and cerebral infarction without residual deficits: Secondary | ICD-10-CM

## 2019-07-14 DIAGNOSIS — Z8249 Family history of ischemic heart disease and other diseases of the circulatory system: Secondary | ICD-10-CM

## 2019-07-14 HISTORY — DX: Unspecified asthma, uncomplicated: J45.909

## 2019-07-14 HISTORY — DX: Dyspnea, unspecified: R06.00

## 2019-07-14 LAB — CBC WITH DIFFERENTIAL/PLATELET
Abs Immature Granulocytes: 0.1 10*3/uL — ABNORMAL HIGH (ref 0.00–0.07)
Basophils Absolute: 0.1 10*3/uL (ref 0.0–0.1)
Basophils Relative: 1 %
Eosinophils Absolute: 0.5 10*3/uL (ref 0.0–0.5)
Eosinophils Relative: 4 %
HCT: 43.6 % (ref 39.0–52.0)
Hemoglobin: 14.9 g/dL (ref 13.0–17.0)
Immature Granulocytes: 1 %
Lymphocytes Relative: 13 %
Lymphs Abs: 1.5 10*3/uL (ref 0.7–4.0)
MCH: 33.6 pg (ref 26.0–34.0)
MCHC: 34.2 g/dL (ref 30.0–36.0)
MCV: 98.4 fL (ref 80.0–100.0)
Monocytes Absolute: 0.9 10*3/uL (ref 0.1–1.0)
Monocytes Relative: 8 %
Neutro Abs: 8.9 10*3/uL — ABNORMAL HIGH (ref 1.7–7.7)
Neutrophils Relative %: 73 %
Platelets: 295 10*3/uL (ref 150–400)
RBC: 4.43 MIL/uL (ref 4.22–5.81)
RDW: 12 % (ref 11.5–15.5)
WBC: 11.9 10*3/uL — ABNORMAL HIGH (ref 4.0–10.5)
nRBC: 0 % (ref 0.0–0.2)

## 2019-07-14 LAB — BASIC METABOLIC PANEL
Anion gap: 10 (ref 5–15)
BUN: 10 mg/dL (ref 8–23)
CO2: 24 mmol/L (ref 22–32)
Calcium: 8.4 mg/dL — ABNORMAL LOW (ref 8.9–10.3)
Chloride: 102 mmol/L (ref 98–111)
Creatinine, Ser: 1.01 mg/dL (ref 0.61–1.24)
GFR calc Af Amer: >60 mL/min (ref >60)
GFR calc non Af Amer: >60 mL/min (ref >60)
Glucose, Bld: 100 mg/dL — ABNORMAL HIGH (ref 70–99)
Potassium: 4.5 mmol/L (ref 3.5–5.1)
Sodium: 136 mmol/L (ref 135–145)

## 2019-07-14 LAB — BRAIN NATRIURETIC PEPTIDE: B Natriuretic Peptide: 586.2 pg/mL — ABNORMAL HIGH (ref 0.0–100.0)

## 2019-07-14 LAB — TROPONIN I (HIGH SENSITIVITY)
Troponin I (High Sensitivity): 8 ng/L (ref <18)
Troponin I (High Sensitivity): 8 ng/L (ref <18)

## 2019-07-14 LAB — LACTIC ACID, PLASMA
Lactic Acid, Venous: 2.7 mmol/L (ref 0.5–1.9)
Lactic Acid, Venous: 1.7 mmol/L (ref 0.5–1.9)

## 2019-07-14 MED ORDER — IOHEXOL 350 MG/ML SOLN
100.0000 mL | Freq: Once | INTRAVENOUS | Status: AC | PRN
  Administered 2019-07-14: 52 mL via INTRAVENOUS

## 2019-07-14 MED ORDER — METHYLPREDNISOLONE SODIUM SUCC 125 MG IJ SOLR
60.0000 mg | Freq: Three times a day (TID) | INTRAMUSCULAR | Status: DC
  Administered 2019-07-15 (×2): 60 mg via INTRAVENOUS
  Filled 2019-07-14 (×2): qty 2

## 2019-07-14 MED ORDER — SODIUM CHLORIDE 0.9 % IV SOLN
2.0000 g | Freq: Once | INTRAVENOUS | Status: AC
  Administered 2019-07-14: 2 g via INTRAVENOUS
  Filled 2019-07-14: qty 2

## 2019-07-14 MED ORDER — SODIUM CHLORIDE 0.9% FLUSH: 3.0000 mL | INTRAVENOUS | Status: DC | PRN

## 2019-07-14 MED ORDER — AEROCHAMBER PLUS FLO-VU LARGE MISC
1.0000 | Freq: Once | Status: AC
  Administered 2019-07-14: 16:00:00 1

## 2019-07-14 MED ORDER — VANCOMYCIN HCL 10 G IV SOLR
1250.0000 mg | Freq: Once | INTRAVENOUS | Status: AC
  Administered 2019-07-14: 1250 mg via INTRAVENOUS
  Filled 2019-07-14: qty 1250

## 2019-07-14 MED ORDER — ALBUTEROL SULFATE HFA 108 (90 BASE) MCG/ACT IN AERS
4.0000 | INHALATION_SPRAY | Freq: Once | RESPIRATORY_TRACT | Status: AC
  Administered 2019-07-14: 4 via RESPIRATORY_TRACT
  Filled 2019-07-14: qty 6.7

## 2019-07-14 MED ORDER — SODIUM CHLORIDE 0.9 % IV SOLN
INTRAVENOUS | Status: AC
  Administered 2019-07-14: 23:00:00 via INTRAVENOUS

## 2019-07-14 MED ORDER — ALBUTEROL SULFATE HFA 108 (90 BASE) MCG/ACT IN AERS
2.0000 | INHALATION_SPRAY | Freq: Four times a day (QID) | RESPIRATORY_TRACT | Status: DC | PRN
  Filled 2019-07-14: qty 6.7

## 2019-07-14 MED ORDER — ENOXAPARIN SODIUM 40 MG/0.4ML ~~LOC~~ SOLN
40.0000 mg | SUBCUTANEOUS | Status: DC
  Administered 2019-07-14 – 2019-07-20 (×7): 40 mg via SUBCUTANEOUS
  Filled 2019-07-14 (×7): qty 0.4

## 2019-07-14 MED ORDER — SODIUM CHLORIDE 0.9 % IV SOLN
250.0000 mL | INTRAVENOUS | Status: DC | PRN
  Administered 2019-07-20: 13:00:00 via INTRAVENOUS

## 2019-07-14 MED ORDER — ALBUTEROL SULFATE HFA 108 (90 BASE) MCG/ACT IN AERS
2.0000 | INHALATION_SPRAY | Freq: Three times a day (TID) | RESPIRATORY_TRACT | Status: DC
  Administered 2019-07-14 – 2019-07-15 (×3): 2 via RESPIRATORY_TRACT
  Filled 2019-07-14: qty 6.7

## 2019-07-14 MED ORDER — AEROCHAMBER PLUS FLO-VU LARGE MISC
Status: AC
  Administered 2019-07-14: 16:00:00 1
  Filled 2019-07-14: qty 1

## 2019-07-14 MED ORDER — METHYLPREDNISOLONE SODIUM SUCC 125 MG IJ SOLR
125.0000 mg | Freq: Once | INTRAMUSCULAR | Status: AC
  Administered 2019-07-14: 16:00:00 125 mg via INTRAVENOUS
  Filled 2019-07-14: qty 2

## 2019-07-14 NOTE — ED Provider Notes (Signed)
[facility] EMERGENCY DEPARTMENT Provider Note   CSN: [id number] Arrival date & time: 07/14/19  1341     History   Chief Complaint Chief Complaint  Patient presents with  . Shortness of Breath    HPI [name] is a 85 y.o. male.     Pt presents to the ED today with sob.  The pt is from home and accompanied by his caregiver.  Pt's caregiver said he's been a little sob over the past few days, but today it was really bad.  She has noticed that his legs have been swollen as well.  Pt has eczema and is on a steroid taper.  Pt denies any pain.  He's not had a cough.  The caregiver said he's been tested recently for covid and it was negative.  He does wear 2 L oxygen via [place] at night (since admission in July for restrictive lung disease and OSA), but does not usually require it during the day.  He was 76% on RA upon arrival here.  No fevers.  No known covid exposures.     Past Medical History:  Diagnosis Date  . Allergy    seasonal  fall  . Cancer ([place])    Basal cell carcinoma; multiple  . Eczema   . Hyperlipidemia   . Hypertension   . Osteoporosis   . Stroke [facility])     Patient Active Problem List   Diagnosis Date Noted  . Diaphragm paralysis 05/04/2019  . Restrictive lung disease 05/04/2019  . Altered mental status 04/30/2019  . Confusion 04/30/2019  . Abnormal weight loss 03/23/2019  . Pain in joint of left shoulder 03/23/2019  . Frequent urination 03/23/2019  . Constipation 03/23/2019  . TIA (transient ischemic attack) 03/08/2019  . Hyponatremia 03/08/2019  . Impaired mobility 01/13/2019  . Seasonal allergies 01/13/2019  . Hiatal hernia 01/13/2019  . Pain in right hip 12/24/2018  . S/P right hip fracture 11/11/2018  . Recurrent falls 05/12/2018  . History of CVA (cerebrovascular accident) 01/16/2018  . Nocturnal enuresis   . Fall   . Full incontinence of feces   . Essential hypertension   . Hypoalbuminemia due to protein-calorie malnutrition  ([place])   . Abnormality of gait   . Leg edema   . Hip fracture ([place]) 03/30/2017  . Leukocytosis 03/29/2017  . Mild intermittent asthma with acute exacerbation 01/29/2017  . Chronic renal insufficiency 09/22/2015  . Tachycardia 09/22/2015  . PVC (premature ventricular contraction) 09/22/2015  . Osteoporosis 06/03/2014  . Basal cell carcinoma of face 06/03/2014  . Benign prostatic hyperplasia 05/27/2013  . Eczema 04/23/2012  . Dyslipidemia 04/23/2012    Past Surgical History:  Procedure Laterality Date  . CATARACT EXTRACTION, BILATERAL  06/29/2015  . INTRAMEDULLARY (IM) NAIL INTERTROCHANTERIC Left 03/30/2017   Procedure: INTRAMEDULLARY (IM) NAIL INTERTROCHANTRIC;  Surgeon: [name], MD;  Location: [facility];  Service: Orthopedics;  Laterality: Left;  . INTRAMEDULLARY (IM) NAIL INTERTROCHANTERIC Right 11/12/2018   Procedure: INTRAMEDULLARY (IM) NAIL RIGHT INTERTROCHANTERIC HIP FRACTURE;  Surgeon: [name], MD;  Location: [facility];  Service: Orthopedics;  Laterality: Right;  . MOHS SURGERY    . TONSILLECTOMY  1940 maybe        Home Medications    Prior to Admission medications   Medication Sig Start Date End Date Taking? Authorizing Provider  acetaminophen (TYLENOL) 500 MG tablet Take 500-1,000 mg by mouth every 6 (six) hours as needed (for headaches).    [provider]  albuterol (  PROVENTIL HFA;VENTOLIN HFA) 108 (90 Base) MCG/ACT inhaler Inhale 2 puffs into the lungs every 6 (six) hours as needed for wheezing or shortness of breath. 09/29/18   Forrest Moron, MD  alendronate (FOSAMAX) 70 MG tablet Take 1 tablet (70 mg total) by mouth every 7 (seven) days. Take with a full glass of water on an empty stomach. Patient taking differently: Take 70 mg by mouth every Saturday. Take with a full glass of water on an empty stomach 07/27/18   Rutherford Guys, MD  beclomethasone (QVAR REDIHALER) 40 MCG/ACT inhaler Inhale 1 puff into the lungs 2 (two) times daily. 05/07/19    Rutherford Guys, MD  Blood Pressure KIT Please check BP twice a day 03/25/19   Rutherford Guys, MD  clopidogrel (PLAVIX) 75 MG tablet TAKE 1 TABLET ONCE DAILY. 06/07/19   Rutherford Guys, MD  docusate sodium (COLACE) 100 MG capsule Take 1 capsule (100 mg total) by mouth daily. Patient taking differently: Take 100 mg by mouth daily as needed for mild constipation.  03/09/19   Geradine Girt, DO  metoprolol succinate (TOPROL-XL) 50 MG 24 hr tablet TAKE 1 TABLET DAILY WITH OR IMMEDIATELY FOLLOWING A MEAL. 05/05/19   Rutherford Guys, MD  Misc. Devices [facility]) MISC Use walker whenever walking, standard walker 07/27/18   Rutherford Guys, MD  pravastatin (PRAVACHOL) 40 MG tablet TAKE 1 TABLET ONCE DAILY. 05/05/19   Rutherford Guys, MD  senna-docusate (SENOKOT-S) 8.6-50 MG tablet Take 1 tablet by mouth at bedtime as needed for moderate constipation. 03/09/19   Geradine Girt, DO  traZODone (DESYREL) 50 MG tablet Take 1 tablet (50 mg total) by mouth at bedtime. 05/07/19   Rutherford Guys, MD    Family History Family History  Problem Relation Age of Onset  . Arthritis Mother   . Heart disease Mother 75  . Hypertension Father     Social History Social History   Tobacco Use  . Smoking status: Never Smoker  . Smokeless tobacco: Never Used  Substance Use Topics  . Alcohol use: No    Comment: occas  . Drug use: No     Allergies   Other   Review of Systems Review of Systems  Respiratory: Positive for shortness of breath.   Skin: Positive for rash.  All other systems reviewed and are negative.    Physical Exam Updated Vital Signs BP 120/90   Pulse 97   Temp 98.8 F (37.1 C) (Oral)   Resp (!) 28   SpO2 (!) 76%   Physical Exam Vitals signs and nursing note reviewed.  Constitutional:      General: He is in acute distress.     Appearance: He is well-developed.  HENT:     Head: Normocephalic and atraumatic.     Mouth/Throat:     Mouth: Mucous membranes are moist.  Eyes:      Extraocular Movements: Extraocular movements intact.     Pupils: Pupils are equal, round, and reactive to light.  Neck:     Musculoskeletal: Normal range of motion and neck supple.  Cardiovascular:     Rate and Rhythm: Normal rate and regular rhythm.  Pulmonary:     Effort: Tachypnea present.     Breath sounds: Wheezing present.  Abdominal:     General: Bowel sounds are normal.     Palpations: Abdomen is soft.  Musculoskeletal:     Right lower leg: Edema present.     Left lower leg:  Edema present.  Skin:    Capillary Refill: Capillary refill takes less than 2 seconds.     Findings: Rash present.     Comments: Diffuse red rash  Neurological:     General: No focal deficit present.     Mental Status: He is alert and oriented to person, place, and time.  Psychiatric:        Mood and Affect: Mood normal.        Behavior: Behavior normal.      ED Treatments / Results  Labs (all labs ordered are listed, but only abnormal results are displayed) Labs Reviewed  BASIC METABOLIC PANEL - Abnormal; Notable for the following components:      Result Value   Glucose, Bld 100 (*)    Calcium 8.4 (*)    All other components within normal limits  CBC WITH DIFFERENTIAL/PLATELET - Abnormal; Notable for the following components:   WBC 11.9 (*)    Neutro Abs 8.9 (*)    Abs Immature Granulocytes 0.10 (*)    All other components within normal limits  CULTURE, BLOOD (ROUTINE X 2)  CULTURE, BLOOD (ROUTINE X 2)  SARS CORONAVIRUS 2 (TAT 6-24 HRS)  BRAIN NATRIURETIC PEPTIDE  LACTIC ACID, PLASMA  LACTIC ACID, PLASMA  TROPONIN I (HIGH SENSITIVITY)    EKG EKG Interpretation  Date/Time:  Wednesday July 14 2019 14:01:52 EDT Ventricular Rate:  98 PR Interval:  112 QRS Duration: 104 QT Interval:  414 QTC Calculation: 528 R Axis:   54 Text Interpretation:  Sinus rhythm with frequent Premature ventricular complexes and Possible Premature atrial complexes with Abberant conduction Nonspecific  ST and T wave abnormality Prolonged QT Abnormal ECG pt is very tremulous Confirmed by Isla Pence [phone]) on 07/14/2019 2:19:05 PM   Radiology Dg Chest Portable 1 View  Result Date: 07/14/2019 CLINICAL DATA:  Per is home health caregiver pt is here with multiple complaints, SOB with tremors x2 days. Pt also has "severe eczema" flare up. Pt uses 2L [place] at night, pt sats 76% in triage, no O2 tank with him. sob EXAM: PORTABLE CHEST 1 VIEW COMPARISON:  05/02/2019 FINDINGS: There is stable elevation of RIGHT hemidiaphragm. The heart size is normal. Stable hiatal hernia. The lungs are free of focal consolidations and pleural effusions. No pulmonary edema. Remote rib fractures. Chronic changes in the RIGHT shoulder. IMPRESSION: 1. No evidence for acute cardiopulmonary abnormality. 2. Stable hiatal hernia. Electronically Signed   By: Nolon Nations M.D.   On: 07/14/2019 14:22    Procedures Procedures (including critical care time)  Medications Ordered in ED Medications  methylPREDNISolone sodium succinate (SOLU-MEDROL) 125 mg/2 mL injection 125 mg (has no administration in time range)  albuterol (VENTOLIN HFA) 108 (90 Base) MCG/ACT inhaler 4 puff (has no administration in time range)  AeroChamber Plus Flo-Vu Large MISC 1 each (has no administration in time range)     Initial Impression / Assessment and Plan / ED Course  I have reviewed the triage vital signs and the nursing notes.  Pertinent labs & imaging results that were available during my care of the patient were reviewed by me and considered in my medical decision making (see chart for details).       Pt placed on 2L and O2 sat only increased to the mid-80s.  O2 increased to 4L and O2 sats in the 90s.  Pt given steroids and albuterol.  Pt is feeling a little better with nl O2 sats.  Labs pending at shift change.  Pt  signed out to Dr. Sedonia Small at shift change.  Final Clinical Impressions(s) / ED Diagnoses   Final diagnoses:  Hypoxia     ED Discharge Orders    None       Isla Pence, MD 07/14/19 1527

## 2019-07-14 NOTE — ED Provider Notes (Signed)
  Provider Note MRN:  [id number]  Arrival date & time: 07/14/19    ED Course and Medical Decision Making  Assumed care from Dr. Bonnita Nasuti at shift change.  Hypoxic respiratory failure, O2 saturation 76 on room air, requiring 4 L nasal cannula.  Uses 2 to 3 L at home as needed.  Thought to be due to flare of restrictive or obstructive lung disease.  Wheezes on exam.  Will need admission, awaiting laboratory assessment.  6:30 PM update: CT is negative for PE, does have bilateral effusions, bilateral atelectasis.  No fever.  Given his hypoxia and increased oxygen requirement, will admit to hospitalist service.  Final Clinical Impressions(s) / ED Diagnoses     ICD-10-CM   1. Hypoxia  R09.02   2. Peripheral edema  R60.9     ED Discharge Orders    None      Discharge Instructions   None     Barth Kirks. Sedonia Small, [place] [email or web address]    Maudie Flakes, MD 07/14/19 [phone]

## 2019-07-14 NOTE — H&P (Addendum)
TRH H&P    Patient Demographics:    [name], is a 85 y.o. male  MRN: [id number]  DOB - 03-Sep-1932  Admit Date - 07/14/2019  Referring MD/NP/PA:  Gerlene Fee  Outpatient Primary MD for the patient is Rutherford Guys, MD  Patient coming from:  home  Chief complaint-  hypoxia   HPI:    [name]  is a 85 y.o. male,  w hypertension, hyperlipidemia, h/o stroke, eczema, ? Asthma,  states that his eczema was worse today and brought to ED for evaluation of dyspnea, "tricked by family per patient".  Pox 76% on RA.  Pt notes dry cough, pt notes lower ext edema, but not certain about his weight.  No c/o orthopnea/ pnd.  pt denies fever, chills, cp, palp, n/v, abd pain, diarrhea, brbpr, black stool, dysuria, hematuria.   In ED,  T 98.8, P 97  R 28,  Bp 120/90  Pox 76% on RA  CTA chest IMPRESSION: 1. Technically adequate exam showing no acute pulmonary embolus. 2. Marked dilatation of the LEFT atrium. There is high attenuation material in the region of the mitral annulus, consistent prior repair or extensive calcification. 3. Bilateral pleural effusions, pulmonary venous congestion, and parenchymal changes of pulmonary edema. 4. RIGHT LOWER lobe consolidation or atelectasis. LEFT LOWER lobe atelectasis. 5. coronary artery disease. 6. Large hiatal hernia. Diffuse esophageal wall thickening, raising the question of esophagitis. 7. LEFT thyroid mass measuring 5.7 cm. Recommend further evaluation with thyroid ultrasound. 8. Remote T5 wedge compression fracture. 9. Aortic Atherosclerosis (ICD10-I70.0).  Na 136, K 4.5, Bun 10, Creatinine 1.01 Wbc 11.9, hgb 14.9 Plt 295 Trop 8 -> 8 BNP 586.2 LA 2.7  Pt will be admitted for acute hypoxic respiratory failure secondary to Hcap, Covid -19 pending.  L thyroid mass     Review of systems:    In addition to the HPI above,  No Fever-chills, No Headache, No  changes with Vision or hearing, No problems swallowing food or Liquids, No Chest pain,  No Abdominal pain, No Nausea or Vomiting, bowel movements are regular, No Blood in stool or Urine, No dysuria, No new skin rashes or bruises, No new joints pains-aches,  No new weakness, tingling, numbness in any extremity, No recent weight gain or loss, No polyuria, polydypsia or polyphagia, No significant Mental Stressors.  All other systems reviewed and are negative.    Past History of the following :    Past Medical History:  Diagnosis Date  . Allergy    seasonal  fall  . Asthma    as child  . Cancer ([place])    Basal cell carcinoma; multiple  . Eczema   . Hyperlipidemia   . Hypertension   . Osteoporosis   . Stroke [facility])       Past Surgical History:  Procedure Laterality Date  . CATARACT EXTRACTION, BILATERAL  06/29/2015  . INTRAMEDULLARY (IM) NAIL INTERTROCHANTERIC Left 03/30/2017   Procedure: INTRAMEDULLARY (IM) NAIL INTERTROCHANTRIC;  Surgeon: [name], MD;  Location: [place];  Service: Orthopedics;  Laterality: Left;  . INTRAMEDULLARY (IM) NAIL INTERTROCHANTERIC Right 11/12/2018   Procedure: INTRAMEDULLARY (IM) NAIL RIGHT INTERTROCHANTERIC HIP FRACTURE;  Surgeon: [name], MD;  Location: [place];  Service: Orthopedics;  Laterality: Right;  . MOHS SURGERY    . TONSILLECTOMY  1940 maybe      Social History:      Social History   Tobacco Use  . Smoking status: Never Smoker  . Smokeless tobacco: Never Used  Substance Use Topics  . Alcohol use: No    Comment: occas       Family History :     Family History  Problem Relation Age of Onset  . Arthritis Mother   . Heart disease Mother 13  . Hypertension Father        Home Medications:   Prior to Admission medications   Medication Sig Start Date End Date Taking? Authorizing Provider  acetaminophen (TYLENOL) 500 MG tablet Take 500-1,000 mg by mouth as needed for mild pain or headache (pain from eczema).     Yes [provider]  albuterol (PROVENTIL HFA;VENTOLIN HFA) 108 (90 Base) MCG/ACT inhaler Inhale 2 puffs into the lungs every 6 (six) hours as needed for wheezing or shortness of breath. 09/29/18  Yes Forrest Moron, MD  alendronate (FOSAMAX) 70 MG tablet Take 1 tablet (70 mg total) by mouth every 7 (seven) days. Take with a full glass of water on an empty stomach. Patient taking differently: Take 70 mg by mouth every Saturday. Take with a full glass of water on an empty stomach 07/27/18  Yes Rutherford Guys, MD  beclomethasone (QVAR REDIHALER) 40 MCG/ACT inhaler Inhale 1 puff into the lungs 2 (two) times daily. 05/07/19  Yes Rutherford Guys, MD  Blood Pressure KIT Please check BP twice a day 03/25/19  Yes Rutherford Guys, MD  clopidogrel (PLAVIX) 75 MG tablet TAKE 1 TABLET ONCE DAILY. Patient taking differently: Take 75 mg by mouth daily.  06/07/19  Yes Rutherford Guys, MD  docusate sodium (COLACE) 100 MG capsule Take 1 capsule (100 mg total) by mouth daily. Patient taking differently: Take 100 mg by mouth daily as needed for mild constipation.  03/09/19  Yes Vann, Jessica U, DO  metoprolol succinate (TOPROL-XL) 50 MG 24 hr tablet TAKE 1 TABLET DAILY WITH OR IMMEDIATELY FOLLOWING A MEAL. Patient taking differently: Take 50 mg by mouth daily.  05/05/19  Yes Rutherford Guys, MD  Misc. Devices [facility]) MISC Use walker whenever walking, standard walker 07/27/18  Yes Rutherford Guys, MD  pravastatin (PRAVACHOL) 40 MG tablet TAKE 1 TABLET ONCE DAILY. Patient taking differently: Take 40 mg by mouth daily.  05/05/19  Yes Rutherford Guys, MD  traZODone (DESYREL) 50 MG tablet Take 1 tablet (50 mg total) by mouth at bedtime. 05/07/19  Yes Rutherford Guys, MD  senna-docusate (SENOKOT-S) 8.6-50 MG tablet Take 1 tablet by mouth at bedtime as needed for moderate constipation. Patient not taking: Reported on 07/14/2019 03/09/19   Geradine Girt, DO     Allergies:     Allergies  Allergen Reactions   . Other Shortness Of Breath, Itching and Other (See Comments)    Seasonal allergies- Runny nose, congestion, itchy eyes, and wheezing     Physical Exam:   Vitals  Blood pressure (!) 133/99, pulse 98, temperature 98.8 F (37.1 C), temperature source Oral, resp. rate 17, SpO2 99 %.  1.  General: axox3  2. Psychiatric: euthymic  3. Neurologic: cn2-12 intact, reflexes  2+ symmetric, diffuse with no clonus, motor 5/5 in all 4 ext  4. HEENMT:  Anicteric, pupils 1.57m symmetric, direct, consensual intact Neck: slight jvd  5. Respiratory : + crackles left lung base, decrease bs in the right lung base, trace wheezing.   6. Cardiovascular : rrr s1, s2, no m/g/r  7. Gastrointestinal:  Abd: soft, nt, nd, +bs  8. Skin:  Ext: no c/c/e,  No rash  9.Musculoskeletal:  Good ROM,  No adenoapthy    Data Review:    CBC Recent Labs  Lab 07/14/19 1440  WBC 11.9*  HGB 14.9  HCT 43.6  PLT 295  MCV 98.4  MCH 33.6  MCHC 34.2  RDW 12.0  LYMPHSABS 1.5  MONOABS 0.9  EOSABS 0.5  BASOSABS 0.1   ------------------------------------------------------------------------------------------------------------------  Results for orders placed or performed during the hospital encounter of 07/14/19 (from the past 48 hour(s))  Basic metabolic panel     Status: Abnormal   Collection Time: 07/14/19  2:40 PM  Result Value Ref Range   Sodium 136 135 - 145 mmol/L   Potassium 4.5 3.5 - 5.1 mmol/L   Chloride 102 98 - 111 mmol/L   CO2 24 22 - 32 mmol/L   Glucose, Bld 100 (H) 70 - 99 mg/dL   BUN 10 8 - 23 mg/dL   Creatinine, Ser 1.01 0.61 - 1.24 mg/dL   Calcium 8.4 (L) 8.9 - 10.3 mg/dL   GFR calc non Af Amer >60 >60 mL/min   GFR calc Af Amer >60 >60 mL/min   Anion gap 10 5 - 15    Comment: Performed at [facility] [address] [place] [id number] CBC with Differential     Status: Abnormal   Collection Time: 07/14/19  2:40 PM  Result Value Ref Range   WBC 11.9 (H) 4.0 -  10.5 K/uL   RBC 4.43 4.22 - 5.81 MIL/uL   Hemoglobin 14.9 13.0 - 17.0 g/dL   HCT 43.6 39.0 - 52.0 %   MCV 98.4 80.0 - 100.0 fL   MCH 33.6 26.0 - 34.0 pg   MCHC 34.2 30.0 - 36.0 g/dL   RDW 12.0 11.5 - 15.5 %   Platelets 295 150 - 400 K/uL   nRBC 0.0 0.0 - 0.2 %   Neutrophils Relative % 73 %   Neutro Abs 8.9 (H) 1.7 - 7.7 K/uL   Lymphocytes Relative 13 %   Lymphs Abs 1.5 0.7 - 4.0 K/uL   Monocytes Relative 8 %   Monocytes Absolute 0.9 0.1 - 1.0 K/uL   Eosinophils Relative 4 %   Eosinophils Absolute 0.5 0.0 - 0.5 K/uL   Basophils Relative 1 %   Basophils Absolute 0.1 0.0 - 0.1 K/uL   Immature Granulocytes 1 %   Abs Immature Granulocytes 0.10 (H) 0.00 - 0.07 K/uL    Comment: Performed at [address] [id number] Troponin I (High Sensitivity)     Status: None   Collection Time: 07/14/19  2:40 PM  Result Value Ref Range   Troponin I (High Sensitivity) 8 <18 ng/L    Comment: (NOTE) Elevated high sensitivity troponin I (hsTnI) values and significant  changes across serial measurements may suggest ACS but many other  chronic and acute conditions are known to elevate hsTnI results.  Refer to the "Links" section for chest pain algorithms and additional  guidance. Performed at [facility] [address] [place] [address]  Brain natriuretic peptide     Status: Abnormal   Collection Time: 07/14/19  2:41 PM  Result Value Ref Range   B Natriuretic Peptide 586.2 (H) 0.0 - 100.0 pg/mL    Comment: Performed at [facility] [address], [place], [place] [address]  Lactic acid, plasma     Status: Abnormal   Collection Time: 07/14/19  2:42 PM  Result Value Ref Range   Lactic Acid, Venous 2.7 (HH) 0.5 - 1.9 mmol/L    Comment: CRITICAL RESULT CALLED TO, READ BACK BY AND VERIFIED WITH: P.PULLIAM,RN 1528 07/14/2019 CLARK,S Performed at [facility], [place] [address] [place], [place] [address]   Lactic acid, plasma      Status: None   Collection Time: 07/14/19  4:18 PM  Result Value Ref Range   Lactic Acid, Venous 1.7 0.5 - 1.9 mmol/L    Comment: Performed at [facility] [address] [place], [place] [address]  Troponin I (High Sensitivity)     Status: None   Collection Time: 07/14/19  4:18 PM  Result Value Ref Range   Troponin I (High Sensitivity) 8 <18 ng/L    Comment: (NOTE) Elevated high sensitivity troponin I (hsTnI) values and significant  changes across serial measurements may suggest ACS but many other  chronic and acute conditions are known to elevate hsTnI results.  Refer to the "Links" section for chest pain algorithms and additional  guidance. Performed at [facility], [place] [address], [place], [place] [address]     Chemistries  Recent Labs  Lab 07/14/19 1440  NA 136  K 4.5  CL 102  CO2 24  GLUCOSE 100*  BUN 10  CREATININE 1.01  CALCIUM 8.4*   ------------------------------------------------------------------------------------------------------------------  ------------------------------------------------------------------------------------------------------------------ GFR: CrCl cannot be calculated (Unknown ideal weight.). Liver Function Tests: No results for input(s): AST, ALT, ALKPHOS, BILITOT, PROT, ALBUMIN in the last 168 hours. No results for input(s): LIPASE, AMYLASE in the last 168 hours. No results for input(s): AMMONIA in the last 168 hours. Coagulation Profile: No results for input(s): INR, PROTIME in the last 168 hours. Cardiac Enzymes: No results for input(s): CKTOTAL, CKMB, CKMBINDEX, TROPONINI in the last 168 hours. BNP (last 3 results) No results for input(s): PROBNP in the last 8760 hours. HbA1C: No results for input(s): HGBA1C in the last 72 hours. CBG: No results for input(s): GLUCAP in the last 168 hours. Lipid Profile: No results for input(s): CHOL, HDL, LDLCALC, TRIG, CHOLHDL, LDLDIRECT in the last 72 hours. Thyroid Function  Tests: No results for input(s): TSH, T4TOTAL, FREET4, T3FREE, THYROIDAB in the last 72 hours. Anemia Panel: No results for input(s): VITAMINB12, FOLATE, FERRITIN, TIBC, IRON, RETICCTPCT in the last 72 hours.  --------------------------------------------------------------------------------------------------------------- Urine analysis:    Component Value Date/Time   COLORURINE YELLOW 04/29/2019 2045   APPEARANCEUR CLEAR 04/29/2019 2045   LABSPEC 1.011 04/29/2019 2045   PHURINE 7.0 04/29/2019 2045   GLUCOSEU NEGATIVE 04/29/2019 2045   HGBUR NEGATIVE 04/29/2019 2045   BILIRUBINUR NEGATIVE 04/29/2019 2045   BILIRUBINUR negative 03/23/2019 1420   BILIRUBINUR neg 06/03/2014 1014   KETONESUR NEGATIVE 04/29/2019 2045   PROTEINUR NEGATIVE 04/29/2019 2045   UROBILINOGEN 1.0 03/23/2019 1420   NITRITE NEGATIVE 04/29/2019 2045   LEUKOCYTESUR NEGATIVE 04/29/2019 2045      Imaging Results:    Ct Angio Chest Pe W And/or Wo Contrast  Result Date: 07/14/2019 CLINICAL DATA:  Per is [place] caregiver pt is here with multiple complaints, SOB with tremors x2  days. Pt also has "severe eczema" flare up. Pt uses 2L [place] at night, pt sats 76% in triage, no O2 tank with him. EXAM: CT ANGIOGRAPHY CHEST WITH CONTRAST TECHNIQUE: Multidetector CT imaging of the chest was performed using the standard protocol during bolus administration of intravenous contrast. Multiplanar CT image reconstructions and MIPs were obtained to evaluate the vascular anatomy. CONTRAST:  10m OMNIPAQUE IOHEXOL 350 MG/ML SOLN COMPARISON:  Chest x-ray on 07/14/2019 FINDINGS: Cardiovascular: The pulmonary arteries are well opacified. There is no acute pulmonary embolus. Heart is enlarged. Dilated LEFT atrium. Pulmonary venous congestion. There is mitral annulus calcification or prior repair. Extensive atherosclerotic calcification of the coronary arteries. There is atherosclerosis of the thoracic aorta not associated with aneurysm. No evidence for  dissection. Mediastinum/Nodes: There is a esophageal wall thickening throughout its thoracic course. Large hiatal hernia. Low-attenuation lesion is identified within the LEFT lobe of the thyroid gland, measuring 5.7 x 4.1 centimeters. Mural nodule is identified along its inferior aspect which measures 1.1 centimeters. The mass displaces the trachea towards the RIGHT. Visualized RIGHT lobe of the thyroid gland is normal in appearance. Precarinal lymph node is 1.2 centimeters. Small RIGHT paratracheal lymph nodes are also present. Lungs/Pleura: RIGHT pleural effusion and RIGHT basilar consolidation. Small LEFT pleural effusion and basilar atelectasis. Subpleural reticular changes are identified within the dependent aspects of the lung. There is septal thickening and the findings are consistent with pulmonary edema. Upper Abdomen: Gallbladder is present. Large hiatal hernia. Musculoskeletal: Remote wedge compression fracture of T5. No acute osseous abnormality. Review of the MIP images confirms the above findings. IMPRESSION: 1. Technically adequate exam showing no acute pulmonary embolus. 2. Marked dilatation of the LEFT atrium. There is high attenuation material in the region of the mitral annulus, consistent prior repair or extensive calcification. 3. Bilateral pleural effusions, pulmonary venous congestion, and parenchymal changes of pulmonary edema. 4. RIGHT LOWER lobe consolidation or atelectasis. LEFT LOWER lobe atelectasis. 5. coronary artery disease. 6. Large hiatal hernia. Diffuse esophageal wall thickening, raising the question of esophagitis. 7. LEFT thyroid mass measuring 5.7 cm. Recommend further evaluation with thyroid ultrasound. 8. Remote T5 wedge compression fracture. 9. Aortic Atherosclerosis (ICD10-I70.0). Electronically Signed   By: ENolon NationsM.D.   On: 07/14/2019 17:30   Dg Chest Portable 1 View  Result Date: 07/14/2019 CLINICAL DATA:  Per is home health caregiver pt is here with  multiple complaints, SOB with tremors x2 days. Pt also has "severe eczema" flare up. Pt uses 2L [place] at night, pt sats 76% in triage, no O2 tank with him. sob EXAM: PORTABLE CHEST 1 VIEW COMPARISON:  05/02/2019 FINDINGS: There is stable elevation of RIGHT hemidiaphragm. The heart size is normal. Stable hiatal hernia. The lungs are free of focal consolidations and pleural effusions. No pulmonary edema. Remote rib fractures. Chronic changes in the RIGHT shoulder. IMPRESSION: 1. No evidence for acute cardiopulmonary abnormality. 2. Stable hiatal hernia. Electronically Signed   By: ENolon NationsM.D.   On: 07/14/2019 14:22   nsr at 100, nl axis, nl int, frequent pvc, no st-t changes c/w ischemia   Assessment & Plan:    Principal Problem:   Acute respiratory failure with hypoxia (HCC) Active Problems:   Eczema   Dyslipidemia   Leg edema   HCAP (healthcare-associated pneumonia)   Thyroid mass  Acute respiratory failure with hypoxia Secondary to Hcap, ddx asthma exacerbation Blood culture x2 Urine strep antigen Urine legionella antigen vanco iv, cefepime iv pharmacy to dose  ?Asthma exacerbation/ Eczema  Solumedrol 44m iv q8h Albuterol HFA 2puff tid Cont Albuterol HFA 2puff q6h prn   Thyroid mass Thyroid ultrasound  LE edema Check LE ultrasound r/o DVT   H/o stroke Cont Plavix 720mpo qday Cont Pravastatin 4083mo qhs  Hypertension Cont Metoprolol XL 73m44m qday (consider switching to calcium channel blocker)   DVT Prophylaxis-   Lovenox - SCDs   AM Labs Ordered, also please review Full Orders  Family Communication: Admission, patients condition and plan of care including tests being ordered have been discussed with the patient and daughter who indicate understanding and agree with the plan and Code Status.  Code Status:  DNR per patient/ daughter,  Daughter in ED at bedside, daughter notified of thyroid nodule  Admission status:  Inpatient: Based on patients clinical  presentation and evaluation of above clinical data, I have made determination that patient meets Inpatient criteria at this time.   Pt will require iv abx, for Hcap, and acute respiratory failure w pox 76% on RA.  Pt has high risk of clinical deterioration.  Pt will require iv solumedrol for asthma exacerbation/ eczema.  Pt will require > 2 nites stay.   Time spent in minutes : 70    JameJani Gravel on 07/14/2019 at 9:10 PM

## 2019-07-14 NOTE — ED Triage Notes (Signed)
Per is [facility] caregiver pt is here with multiple complaints, SOB with tremors x2 days. Pt also has "severe eczema" flare up. Pt uses 2L [place] at night, pt sats 76% in triage, no O2 tank with him.

## 2019-07-14 NOTE — Progress Notes (Signed)
Pharmacy Antibiotic Note  [name] is a 85 y.o. male admitted on 07/14/2019 with pneumonia.  Pharmacy has been consulted for vancomycin/cefepime dosing. Afebrile, WBC 11.9, LA 2.7>1.7. SCr 1.01 on admit.  Plan: Cefepime 2g IV x 1; then 2g IV q12h Vancomycin 1250mg  IV x1; then Vancomycin 1000 mg IV Q 24 hrs. Goal AUC 400-550. Expected AUC: 431 SCr used: 1.01 Monitor clinical progress, c/s, renal function F/u de-escalation plan/LOT, vancomycin levels as indicated      Temp (24hrs), Avg:98.8 F (37.1 C), Min:98.8 F (37.1 C), Max:98.8 F (37.1 C)  Recent Labs  Lab 07/14/19 1440 07/14/19 1442 07/14/19 1618  WBC 11.9*  --   --   CREATININE 1.01  --   --   LATICACIDVEN  --  2.7* 1.7    CrCl cannot be calculated (Unknown ideal weight.).    Allergies  Allergen Reactions  . Other Shortness Of Breath, Itching and Other (See Comments)    Seasonal allergies- Runny nose, congestion, itchy eyes, and wheezing    Antimicrobials this admission: 9/16 vancomycin >>  9/16 cefepime >>   Dose adjustments this admission:   Microbiology results:   Elicia Lamp, PharmD, BCPS Clinical Pharmacist 07/14/2019 9:34 PM

## 2019-07-15 ENCOUNTER — Encounter (HOSPITAL_COMMUNITY): Payer: Self-pay

## 2019-07-15 ENCOUNTER — Inpatient Hospital Stay (HOSPITAL_COMMUNITY): Payer: Medicare Other

## 2019-07-15 DIAGNOSIS — Z85828 Personal history of other malignant neoplasm of skin: Secondary | ICD-10-CM

## 2019-07-15 DIAGNOSIS — G4733 Obstructive sleep apnea (adult) (pediatric): Secondary | ICD-10-CM

## 2019-07-15 DIAGNOSIS — M4854XD Collapsed vertebra, not elsewhere classified, thoracic region, subsequent encounter for fracture with routine healing: Secondary | ICD-10-CM

## 2019-07-15 DIAGNOSIS — Z8673 Personal history of transient ischemic attack (TIA), and cerebral infarction without residual deficits: Secondary | ICD-10-CM

## 2019-07-15 DIAGNOSIS — J302 Other seasonal allergic rhinitis: Secondary | ICD-10-CM

## 2019-07-15 DIAGNOSIS — R0902 Hypoxemia: Secondary | ICD-10-CM

## 2019-07-15 DIAGNOSIS — J984 Other disorders of lung: Secondary | ICD-10-CM

## 2019-07-15 DIAGNOSIS — J9601 Acute respiratory failure with hypoxia: Secondary | ICD-10-CM

## 2019-07-15 DIAGNOSIS — I361 Nonrheumatic tricuspid (valve) insufficiency: Secondary | ICD-10-CM

## 2019-07-15 DIAGNOSIS — E079 Disorder of thyroid, unspecified: Secondary | ICD-10-CM

## 2019-07-15 DIAGNOSIS — B9561 Methicillin susceptible Staphylococcus aureus infection as the cause of diseases classified elsewhere: Secondary | ICD-10-CM

## 2019-07-15 DIAGNOSIS — R609 Edema, unspecified: Secondary | ICD-10-CM

## 2019-07-15 DIAGNOSIS — E041 Nontoxic single thyroid nodule: Secondary | ICD-10-CM

## 2019-07-15 DIAGNOSIS — I499 Cardiac arrhythmia, unspecified: Secondary | ICD-10-CM

## 2019-07-15 DIAGNOSIS — R600 Localized edema: Secondary | ICD-10-CM

## 2019-07-15 DIAGNOSIS — E785 Hyperlipidemia, unspecified: Secondary | ICD-10-CM

## 2019-07-15 DIAGNOSIS — Z9981 Dependence on supplemental oxygen: Secondary | ICD-10-CM

## 2019-07-15 DIAGNOSIS — R7881 Bacteremia: Secondary | ICD-10-CM

## 2019-07-15 DIAGNOSIS — I10 Essential (primary) hypertension: Secondary | ICD-10-CM

## 2019-07-15 DIAGNOSIS — L2084 Intrinsic (allergic) eczema: Secondary | ICD-10-CM

## 2019-07-15 DIAGNOSIS — I350 Nonrheumatic aortic (valve) stenosis: Secondary | ICD-10-CM

## 2019-07-15 LAB — BLOOD CULTURE ID PANEL (REFLEXED)

## 2019-07-15 LAB — CBC
HCT: 39.1 % (ref 39.0–52.0)
Hemoglobin: 12.7 g/dL — ABNORMAL LOW (ref 13.0–17.0)
MCH: 32.1 pg (ref 26.0–34.0)
MCHC: 32.5 g/dL (ref 30.0–36.0)
MCV: 98.7 fL (ref 80.0–100.0)
Platelets: 281 10*3/uL (ref 150–400)
RBC: 3.96 MIL/uL — ABNORMAL LOW (ref 4.22–5.81)
RDW: 12.1 % (ref 11.5–15.5)
WBC: 9.4 10*3/uL (ref 4.0–10.5)
nRBC: 0 % (ref 0.0–0.2)

## 2019-07-15 LAB — ECHOCARDIOGRAM COMPLETE
Height: 70 in
Weight: 2536.17 oz

## 2019-07-15 LAB — HIV ANTIBODY (ROUTINE TESTING W REFLEX): HIV Screen 4th Generation wRfx: NONREACTIVE

## 2019-07-15 LAB — SARS CORONAVIRUS 2 (TAT 6-24 HRS): SARS Coronavirus 2: NEGATIVE

## 2019-07-15 MED ORDER — METOPROLOL SUCCINATE ER 50 MG PO TB24
50.0000 mg | ORAL_TABLET | Freq: Every day | ORAL | Status: DC
  Administered 2019-07-15 – 2019-07-21 (×7): 50 mg via ORAL
  Filled 2019-07-15 (×7): qty 1

## 2019-07-15 MED ORDER — BUDESONIDE 0.25 MG/2ML IN SUSP
0.2500 mg | Freq: Two times a day (BID) | RESPIRATORY_TRACT | Status: DC
  Administered 2019-07-15: 0.25 mg via RESPIRATORY_TRACT
  Filled 2019-07-15 (×2): qty 2

## 2019-07-15 MED ORDER — FUROSEMIDE 10 MG/ML IJ SOLN
40.0000 mg | Freq: Two times a day (BID) | INTRAMUSCULAR | Status: DC
  Administered 2019-07-15 – 2019-07-16 (×2): 40 mg via INTRAVENOUS
  Filled 2019-07-15 (×2): qty 4

## 2019-07-15 MED ORDER — TRAZODONE HCL 50 MG PO TABS
50.0000 mg | ORAL_TABLET | Freq: Every day | ORAL | Status: DC
  Administered 2019-07-15 – 2019-07-20 (×6): 50 mg via ORAL
  Filled 2019-07-15 (×6): qty 1

## 2019-07-15 MED ORDER — DOCUSATE SODIUM 100 MG PO CAPS: 100.0000 mg | ORAL_CAPSULE | Freq: Every day | ORAL | Status: DC | PRN

## 2019-07-15 MED ORDER — ALBUTEROL SULFATE (2.5 MG/3ML) 0.083% IN NEBU
3.0000 mL | INHALATION_SOLUTION | Freq: Two times a day (BID) | RESPIRATORY_TRACT | Status: DC
  Filled 2019-07-15 (×3): qty 3

## 2019-07-15 MED ORDER — CLOPIDOGREL BISULFATE 75 MG PO TABS
75.0000 mg | ORAL_TABLET | Freq: Every day | ORAL | Status: DC
  Administered 2019-07-15 – 2019-07-21 (×7): 75 mg via ORAL
  Filled 2019-07-15 (×7): qty 1

## 2019-07-15 MED ORDER — VANCOMYCIN HCL IN DEXTROSE 1-5 GM/200ML-% IV SOLN: 1000.0000 mg | INTRAVENOUS | Status: DC

## 2019-07-15 MED ORDER — PRAVASTATIN SODIUM 40 MG PO TABS
40.0000 mg | ORAL_TABLET | Freq: Every day | ORAL | Status: DC
  Administered 2019-07-15 – 2019-07-21 (×7): 40 mg via ORAL
  Filled 2019-07-15 (×8): qty 1

## 2019-07-15 MED ORDER — CEFAZOLIN SODIUM-DEXTROSE 2-4 GM/100ML-% IV SOLN
2.0000 g | Freq: Three times a day (TID) | INTRAVENOUS | Status: DC
  Administered 2019-07-15 – 2019-07-20 (×15): 2 g via INTRAVENOUS
  Filled 2019-07-15 (×17): qty 100

## 2019-07-15 MED ORDER — SODIUM CHLORIDE 0.9 % IV SOLN
2.0000 g | Freq: Two times a day (BID) | INTRAVENOUS | Status: DC
  Administered 2019-07-15: 2 g via INTRAVENOUS
  Filled 2019-07-15 (×2): qty 2

## 2019-07-15 NOTE — Consult Note (Signed)
[place] for Infectious Disease    Date of Admission:  07/14/2019     Total days of antibiotics                Reason for Consult: MSSA Bacteremia    Referring Provider: [facility] / [employer] Primary Care Provider: Rutherford Guys, MD   Assessment:  Mr. Thane is an 85 year old male with MSSA bacteremia and acute respiratory failure with hypoxia.  A subsequent thyroid mass was also found on CT scan.  Source of MSSA infection unclear at present time.  Pneumonia does not appear likely.  Hypoxemia appears improved and currently on room air.  Obtain new TTE to rule out endocarditis and will likely require TEE depending upon plan of care.  Repeat blood cultures ordered.  Discontinue cefepime and vancomycin and narrowed to cefazolin.  Will likely require prolonged therapy pending further work-up. May benefit from SLP evaluation to rule out aspiration.    Plan:  1.  Discontinue cefepime and vancomycin. 2.  Start cefazolin per pharmacy protocol. 3.  TTE ordered to rule out endocarditis. 4.  Repeat blood cultures to ensure clearance of bacteremia. 5.  Thyroid mass work-up per primary team.   Principal Problem:   MSSA bacteremia Active Problems:   Acute respiratory failure with hypoxia (HCC)   Eczema   Dyslipidemia   Leg edema   HCAP (healthcare-associated pneumonia)   Thyroid mass   . albuterol  2 puff Inhalation TID  . budesonide  0.25 mg Inhalation BID  . clopidogrel  75 mg Oral Daily  . enoxaparin (LOVENOX) injection  40 mg Subcutaneous Q24H  . methylPREDNISolone (SOLU-MEDROL) injection  60 mg Intravenous Q8H  . metoprolol succinate  50 mg Oral Daily  . pravastatin  40 mg Oral q1800  . traZODone  50 mg Oral QHS     HPI: Athanasios Rindlisbacher is a 85 y.o. male  With previous medical history of hypertension, hyperlipidemia, basal cell carcinoma and previous CVA brought to the ED for evaluation of dyspnea and concern for eczema flare. In triage he was noted to have O2  saturation of 76%.  Mr. Lenkiewicz comes from home and wears 2L of oxygen at night for restrictive lung disease and OSA. Caregiver with him in the ED informed he is on steroid taper for eczema.  Afebrile in the ED with tachycardia.  Oxygen saturations increased with 4 L of O2 via nasal cannula.  Chest x-ray with no evidence of acute cardiopulmonary abnormalities and without consolidations.  CT scan of the chest with no acute pulmonary emboli; bilateral pleural effusions; right lower lobe consolidation or atelectasis with left lower lobe atelectasis.  New thyroid mass measuring 5.7 cm was also found with recommended further evaluation.  A remote T5 wedge compression fracture was also seen remotely.  Mr. Beecham has been afebrile since admission with no leukocytosis.  Blood cultures now positive for MSSA in 2/4 bottles.  Current antimicrobial regimen is vancomycin and cefepime. He has recently been hospitalized for TIA and also recently seen by orthopedics for evaluation of now healed hip fracture and non-union distal third clavicle fracture.  Mr. Abdullahi has been in his normal state of health recently and denies any recent fevers, chills, or sweats. He does have a cough which occurs primarily when he eats or takes in a lot of liquid. Denies cough at other times. He has had lower extremity swelling which his caregiver has been concerned about. Denies any shortness of breath, chest pain or calf  pain. He has been eating and drinking well. No other pains.   Review of Systems: Review of Systems  Constitutional: Negative for chills, fever and weight loss.  Respiratory: Positive for cough. Negative for shortness of breath and wheezing.   Cardiovascular: Negative for chest pain and leg swelling.  Gastrointestinal: Negative for abdominal pain, constipation, diarrhea, nausea and vomiting.  Musculoskeletal: Negative for back pain and neck pain.  Skin: Negative for rash.     Past Medical History:  Diagnosis Date  .  Allergy    seasonal  fall  . Asthma    as child  . Cancer ([place])    Basal cell carcinoma; multiple  . Dyspnea   . Eczema   . Hyperlipidemia   . Hypertension   . Osteoporosis   . Stroke [facility])     Social History   Tobacco Use  . Smoking status: Never Smoker  . Smokeless tobacco: Never Used  Substance Use Topics  . Alcohol use: No    Comment: occas  . Drug use: No    Family History  Problem Relation Age of Onset  . Arthritis Mother   . Heart disease Mother 22  . Hypertension Father     Allergies  Allergen Reactions  . Other Shortness Of Breath, Itching and Other (See Comments)    Seasonal allergies- Runny nose, congestion, itchy eyes, and wheezing    OBJECTIVE: Blood pressure 103/63, pulse 92, temperature 98.1 F (36.7 C), temperature source Oral, resp. rate 20, height 5\' 10"  (1.778 m), weight 71.9 kg, SpO2 98 %.  Physical Exam Constitutional:      General: He is not in acute distress.    Appearance: He is well-developed. He is not ill-appearing.     Comments: Lying in bed with head of bed elevated eating breakfast; pleasant.   Cardiovascular:     Rate and Rhythm: Normal rate. Rhythm irregular.     Heart sounds: Normal heart sounds.     Comments: 1-2+ pitting edema bilateral lower extremities.  Pulmonary:     Effort: Pulmonary effort is normal.     Breath sounds: Normal breath sounds. No decreased breath sounds, wheezing, rhonchi or rales.  Abdominal:     Palpations: There is no mass.     Tenderness: There is no abdominal tenderness. There is no guarding.  Skin:    General: Skin is warm and dry.     Findings: No rash.  Neurological:     Mental Status: He is alert. Mental status is at baseline.  Psychiatric:        Mood and Affect: Mood normal.        Thought Content: Thought content normal.        Judgment: Judgment normal.     Lab Results Lab Results  Component Value Date   WBC 9.4 07/15/2019   HGB 12.7 (L) 07/15/2019   HCT 39.1 07/15/2019   MCV  98.7 07/15/2019   PLT 281 07/15/2019    Lab Results  Component Value Date   CREATININE 1.01 07/14/2019   BUN 10 07/14/2019   NA 136 07/14/2019   K 4.5 07/14/2019   CL 102 07/14/2019   CO2 24 07/14/2019    Lab Results  Component Value Date   ALT 13 04/30/2019   AST 20 04/30/2019   ALKPHOS 76 04/30/2019   BILITOT 1.1 04/30/2019     Microbiology: Recent Results (from the past 240 hour(s))  Culture, blood (routine x 2)     Status: None (Preliminary result)  Collection Time: 07/14/19  2:18 PM   Specimen: BLOOD  Result Value Ref Range Status   Specimen Description BLOOD RIGHT ANTECUBITAL  Final   Special Requests   Final    BOTTLES DRAWN AEROBIC AND ANAEROBIC Blood Culture adequate volume   Culture  Setup Time   Final    GRAM POSITIVE COCCI IN BOTH AEROBIC AND ANAEROBIC BOTTLES Organism ID to follow CRITICAL RESULT CALLED TO, READ BACK BY AND VERIFIED WITH: PHRMD V BRYK @0545  07/15/19 BY S GEZAHEGN Performed at [facility], [place] [address], [place], [place] [address]    Culture PENDING  Incomplete   Report Status PENDING  Incomplete  Blood Culture ID Panel (Reflexed)     Status: Abnormal   Collection Time: 07/14/19  2:18 PM  Result Value Ref Range Status   Enterococcus species NOT DETECTED NOT DETECTED Final   Listeria monocytogenes NOT DETECTED NOT DETECTED Final   Staphylococcus species DETECTED (A) NOT DETECTED Final    Comment: CRITICAL RESULT CALLED TO, READ BACK BY AND VERIFIED WITH: PHRMD V BRYK @0545  07/15/19 BY S GEZAHEGN    Staphylococcus aureus (BCID) DETECTED (A) NOT DETECTED Final    Comment: Methicillin (oxacillin) susceptible Staphylococcus aureus (MSSA). Preferred therapy is anti staphylococcal beta lactam antibiotic (Cefazolin or Nafcillin), unless clinically contraindicated. CRITICAL RESULT CALLED TO, READ BACK BY AND VERIFIED WITH: PHRMD V BRYK @0545  07/15/19 BY S GEZAHEGN    Methicillin resistance NOT DETECTED NOT DETECTED Final   Streptococcus  species NOT DETECTED NOT DETECTED Final   Streptococcus agalactiae NOT DETECTED NOT DETECTED Final   Streptococcus pneumoniae NOT DETECTED NOT DETECTED Final   Streptococcus pyogenes NOT DETECTED NOT DETECTED Final   Acinetobacter baumannii NOT DETECTED NOT DETECTED Final   Enterobacteriaceae species NOT DETECTED NOT DETECTED Final   Enterobacter cloacae complex NOT DETECTED NOT DETECTED Final   Escherichia coli NOT DETECTED NOT DETECTED Final   Klebsiella oxytoca NOT DETECTED NOT DETECTED Final   Klebsiella pneumoniae NOT DETECTED NOT DETECTED Final   Proteus species NOT DETECTED NOT DETECTED Final   Serratia marcescens NOT DETECTED NOT DETECTED Final   Haemophilus influenzae NOT DETECTED NOT DETECTED Final   Neisseria meningitidis NOT DETECTED NOT DETECTED Final   Pseudomonas aeruginosa NOT DETECTED NOT DETECTED Final   Candida albicans NOT DETECTED NOT DETECTED Final   Candida glabrata NOT DETECTED NOT DETECTED Final   Candida krusei NOT DETECTED NOT DETECTED Final   Candida parapsilosis NOT DETECTED NOT DETECTED Final   Candida tropicalis NOT DETECTED NOT DETECTED Final    Comment: Performed at [facility], [place] [address], [place], [place] [address]  SARS CORONAVIRUS 2 (TAT 6-24 HRS) Nasopharyngeal Nasopharyngeal Swab     Status: None   Collection Time: 07/14/19  2:27 PM   Specimen: Nasopharyngeal Swab  Result Value Ref Range Status   SARS Coronavirus 2 NEGATIVE NEGATIVE Final    Comment: (NOTE) SARS-CoV-2 target nucleic acids are NOT DETECTED. The SARS-CoV-2 RNA is generally detectable in upper and lower respiratory specimens during the acute phase of infection. Negative results do not preclude SARS-CoV-2 infection, do not rule out co-infections with other pathogens, and should not be used as the sole basis for treatment or other patient management decisions. Negative results must be combined with clinical observations, patient history, and epidemiological information.  The expected result is Negative. Fact Sheet for Patients: SugarRoll.be Fact Sheet for Healthcare Providers: https://www.woods-mathews.com/ This test is not yet approved or cleared by the [place] FDA and  has been authorized for detection and/or diagnosis of SARS-CoV-2 by FDA under an Emergency Use Authorization (EUA). This EUA will remain  in effect (meaning this test can be used) for the duration of the COVID-19 declaration under Section 56 4(b)(1) of the Act, 21 U.S.C. section 360bbb-3(b)(1), unless the authorization is terminated or revoked sooner. Performed at [facility], [place] [address], [place], [place] [address]      Terri Piedra, [place] [facility] [phone] Pager  07/15/2019  9:26 AM

## 2019-07-15 NOTE — Plan of Care (Signed)

## 2019-07-15 NOTE — Progress Notes (Addendum)
PROGRESS NOTE    [name]  [id number] DOB: 03/01/1932 DOA: 07/14/2019 PCP: Rutherford Guys, MD    Brief Narrative:  85 year old male who presented with hypoxemia.  He does have significant past medical history for hypertension, dyslipidemia, history of stroke, eczema and asthma.  Patient lives at home, he has a caregiver who noticed him having worsening lower extremity edema along with diffuse eczema.  Patient was taken to the urgent care where he was noted to be hypoxic and edematous and referred to the hospital for further evaluation.  On his initial physical examination his temperature was 98.8, pulse rate 97, respiratory rate 28, blood pressure 120/90, oxygen saturation 76% on room air.  His lungs had rales bilaterally, positive wheezing, heart S1-S2 present and rhythmic, positive systolic murmur at the base, abdomen soft, 3+ bilateral extremity edema. Sodium 136, potassium 4.5, chloride 102, bicarb 24, glucose 100, BUN 10, creatinine 1.0, BNP 586, troponin 8, white count 11.9, hemoglobin 14.9, hematocrit 43.6, platelets 195.  SARS COVID-19 was negative.  Chest radiograph has right bibasilar more right than left basal atelectasis.  CT chest with bilateral groundglass opacities, bilateral pleural effusions.  Right and left, right atelectasis, left thyroid mass 5.7 cm  Patient was admitted to the hospital with a working diagnosis of acute hypoxic respiratory failure due to pulmonary edema/bilateral pleural effusions likely diastolic heart failure/rule out community-acquired pneumonia.  Assessment & Plan:   Principal Problem:   MSSA bacteremia Active Problems:   Eczema   Dyslipidemia   Leg edema   Acute respiratory failure with hypoxia (HCC)   Thyroid mass   Hypoxia   Peripheral edema   Thyroid nodule   1. Acute hypoxic respiratory failure due to diastolic heart failure exacerbation. Patient with hypervolemia, will start patient on furosemide 40 mg IV q12 for diuresis, will follow  on echocardiogram and continue blood pressure monitoring. Follow with [place] lower extremities.   2. MSSA bacteremia, in the setting of generalized erythematous rash/ pruriginous. Blood cultures resulted positive for MSSA bacteremia, 2 bottles. Antibiotic narrowed to cefazolin, will follow with echocardiogram, and ID recommendations. No open skin lesions. Will discontinue systemic steroids for now.   3. HTN. Continue metoprolol for blood pressure control.   4. Incidental thyroid nodule.  Ultrasonography with asymmetric thyromegaly secondary to complex cystic lesion, check TSH in am.   5. Asthma. No clinical signs of exacerbation, will discontinue steroids and will continue bronchodilator therapy as needed.   6. Hx of CVA. Continue with clopidogrel and pravastatin.   Patient ruled out for pneumonia  DVT prophylaxis: enoxaparin   Code Status:  dnr  Family Communication: no family at the bedside  Disposition Plan/ discharge barriers: pending clinical improvement,   Body mass index is 22.74 kg/m. Malnutrition Type:      Malnutrition Characteristics:      Nutrition Interventions:     RN Pressure Injury Documentation:     Consultants:   ID   Procedures:     Antimicrobials:       Subjective: Patient is feeling better, continue to have significant lower extremity edema, no nausea or vomiting, dyspnea continue improve, positive pruritus generalized.   Objective: Vitals:   07/15/19 0215 07/15/19 0321 07/15/19 0751 07/15/19 0815  BP: (!) 111/58  103/63   Pulse: 88  85 92  Resp: 19  17 20   Temp:   98.1 F (36.7 C)   TempSrc:   Oral   SpO2: 94%  99% 98%  Weight:  71.9 kg  Height:  5\' 10"  (1.778 m)     No intake or output data in the 24 hours ending 07/15/19 1321 Filed Weights   07/15/19 0321  Weight: 71.9 kg    Examination:   General: deconditioned  Neurology: Awake and alert, non focal  E ENT: mild pallor, no icterus, oral mucosa moist Cardiovascular:  No JVD. S1-S2 present, rhythmic, no gallops, rubs, systolic 3.6 at the base systolic murmurs. +++ pitting lower extremity edema. Pulmonary: positive  breath sounds bilaterally, adequate air movement, no wheezing, rhonchi or rales. Gastrointestinal. Abdomen with no organomegaly, non tender, no rebound or guarding Skin. No rashes Musculoskeletal: no joint deformities     Data Reviewed: I have personally reviewed following labs and imaging studies  CBC: Recent Labs  Lab 07/14/19 1440 07/15/19 0427  WBC 11.9* 9.4  NEUTROABS 8.9*  --   HGB 14.9 12.7*  HCT 43.6 39.1  MCV 98.4 98.7  PLT 295 [id number]   Basic Metabolic Panel: Recent Labs  Lab 07/14/19 1440  NA 136  K 4.5  CL 102  CO2 24  GLUCOSE 100*  BUN 10  CREATININE 1.01  CALCIUM 8.4*   GFR: Estimated Creatinine Clearance: 54.4 mL/min (by C-G formula based on SCr of 1.01 mg/dL). Liver Function Tests: No results for input(s): AST, ALT, ALKPHOS, BILITOT, PROT, ALBUMIN in the last 168 hours. No results for input(s): LIPASE, AMYLASE in the last 168 hours. No results for input(s): AMMONIA in the last 168 hours. Coagulation Profile: No results for input(s): INR, PROTIME in the last 168 hours. Cardiac Enzymes: No results for input(s): CKTOTAL, CKMB, CKMBINDEX, TROPONINI in the last 168 hours. BNP (last 3 results) No results for input(s): PROBNP in the last 8760 hours. HbA1C: No results for input(s): HGBA1C in the last 72 hours. CBG: No results for input(s): GLUCAP in the last 168 hours. Lipid Profile: No results for input(s): CHOL, HDL, LDLCALC, TRIG, CHOLHDL, LDLDIRECT in the last 72 hours. Thyroid Function Tests: No results for input(s): TSH, T4TOTAL, FREET4, T3FREE, THYROIDAB in the last 72 hours. Anemia Panel: No results for input(s): VITAMINB12, FOLATE, FERRITIN, TIBC, IRON, RETICCTPCT in the last 72 hours.    Radiology Studies: I have reviewed all of the imaging during this hospital visit personally      Scheduled Meds: . albuterol  2 puff Inhalation TID  . budesonide  0.25 mg Inhalation BID  . clopidogrel  75 mg Oral Daily  . enoxaparin (LOVENOX) injection  40 mg Subcutaneous Q24H  . methylPREDNISolone (SOLU-MEDROL) injection  60 mg Intravenous Q8H  . metoprolol succinate  50 mg Oral Daily  . pravastatin  40 mg Oral q1800  . traZODone  50 mg Oral QHS   Continuous Infusions: . sodium chloride    .  ceFAZolin (ANCEF) IV       LOS: 1 day        Mauricio Gerome Apley, MD

## 2019-07-15 NOTE — Progress Notes (Signed)
I spoke with patient's  daughter at the bedside and all questions were addressed.

## 2019-07-15 NOTE — Plan of Care (Signed)

## 2019-07-15 NOTE — ED Notes (Signed)
ED TO INPATIENT HANDOFF REPORT  ED Nurse Name and Phone #:   Lenice Pressman [phone]  S Name/Age/Gender [name] 85 y.o. male Room/Bed: 035C/035C  Code Status   Code Status: DNR  Home/SNF/Other Home Patient oriented to: self, place, time and situation Is this baseline? Yes   Triage Complete: Triage complete  Chief Complaint sob  Triage Note Per is [facility] caregiver pt is here with multiple complaints, SOB with tremors x2 days. Pt also has "severe eczema" flare up. Pt uses 2L [place] at night, pt sats 76% in triage, no O2 tank with him.     Allergies Allergies  Allergen Reactions  . Other Shortness Of Breath, Itching and Other (See Comments)    Seasonal allergies- Runny nose, congestion, itchy eyes, and wheezing    Level of Care/Admitting Diagnosis ED Disposition    ED Disposition Condition [facility] Area: [place] [[id number]]  Level of Care: Telemetry Medical [id number]  Covid Evaluation: Person Under Investigation (PUI)  Diagnosis: Acute respiratory failure with [id number]  Admitting Physician: Jani Gravel [[id number]]  Attending Physician: Jani Gravel [phone]  Estimated length of stay: past midnight tomorrow  Certification:: I certify this patient will need inpatient services for at least 2 midnights  PT Class (Do Not Modify): Inpatient [101]  PT Acc Code (Do Not Modify): Private [1]       B Medical/Surgery History Past Medical History:  Diagnosis Date  . Allergy    seasonal  fall  . Asthma    as child  . Cancer ([place])    Basal cell carcinoma; multiple  . Eczema   . Hyperlipidemia   . Hypertension   . Osteoporosis   . Stroke [facility])    Past Surgical History:  Procedure Laterality Date  . CATARACT EXTRACTION, BILATERAL  06/29/2015  . INTRAMEDULLARY (IM) NAIL INTERTROCHANTERIC Left 03/30/2017   Procedure: INTRAMEDULLARY (IM) NAIL INTERTROCHANTRIC;  Surgeon: [name], MD;  Location: [place];  Service: Orthopedics;  Laterality:  Left;  . INTRAMEDULLARY (IM) NAIL INTERTROCHANTERIC Right 11/12/2018   Procedure: INTRAMEDULLARY (IM) NAIL RIGHT INTERTROCHANTERIC HIP FRACTURE;  Surgeon: [name], MD;  Location: [place];  Service: Orthopedics;  Laterality: Right;  . MOHS SURGERY    . TONSILLECTOMY  1940 maybe     A IV Location/Drains/Wounds Patient Lines/Drains/Airways Status   Active Line/Drains/Airways    Name:   Placement date:   Placement time:   Site:   Days:   Peripheral IV 07/14/19 Right Hand   07/14/19    2249    Hand   1   Incision (Closed) 11/12/18 Hip Right   11/12/18    1517     245   Incision (Closed) 11/12/18 Thigh Right   11/12/18    1517     245   Incision (Closed) 11/12/18 Thigh Right   11/12/18    1517     245   Wound / Incision (Open or Dehisced) 01/17/18 Leg Left Left shin   01/17/18    0700    Leg   544          Intake/Output Last 24 hours No intake or output data in the 24 hours ending 07/15/19 0222  Labs/Imaging Results for orders placed or performed during the hospital encounter of 07/14/19 (from the past 48 hour(s))  SARS CORONAVIRUS 2 (TAT 6-24 HRS) Nasopharyngeal Nasopharyngeal Swab     Status: None   Collection Time: 07/14/19  2:27 PM  Specimen: Nasopharyngeal Swab  Result Value Ref Range   SARS Coronavirus 2 NEGATIVE NEGATIVE    Comment: (NOTE) SARS-CoV-2 target nucleic acids are NOT DETECTED. The SARS-CoV-2 RNA is generally detectable in upper and lower respiratory specimens during the acute phase of infection. Negative results do not preclude SARS-CoV-2 infection, do not rule out co-infections with other pathogens, and should not be used as the sole basis for treatment or other patient management decisions. Negative results must be combined with clinical observations, patient history, and epidemiological information. The expected result is Negative. Fact Sheet for Patients: SugarRoll.be Fact Sheet for Healthcare  Providers: https://www.woods-mathews.com/ This test is not yet approved or cleared by the [place] FDA and  has been authorized for detection and/or diagnosis of SARS-CoV-2 by FDA under an Emergency Use Authorization (EUA). This EUA will remain  in effect (meaning this test can be used) for the duration of the COVID-19 declaration under Section 56 4(b)(1) of the Act, 21 U.S.C. section 360bbb-3(b)(1), unless the authorization is terminated or revoked sooner. Performed at [facility], [place] [address], [place], [place] [id number]   Basic metabolic panel     Status: Abnormal   Collection Time: 07/14/19  2:40 PM  Result Value Ref Range   Sodium 136 135 - 145 mmol/L   Potassium 4.5 3.5 - 5.1 mmol/L   Chloride 102 98 - 111 mmol/L   CO2 24 22 - 32 mmol/L   Glucose, Bld 100 (H) 70 - 99 mg/dL   BUN 10 8 - 23 mg/dL   Creatinine, Ser 1.01 0.61 - 1.24 mg/dL   Calcium 8.4 (L) 8.9 - 10.3 mg/dL   GFR calc non Af Amer >60 >60 mL/min   GFR calc Af Amer >60 >60 mL/min   Anion gap 10 5 - 15    Comment: Performed at [facility] [address], [place], [place] [address]  CBC with Differential     Status: Abnormal   Collection Time: 07/14/19  2:40 PM  Result Value Ref Range   WBC 11.9 (H) 4.0 - 10.5 K/uL   RBC 4.43 4.22 - 5.81 MIL/uL   Hemoglobin 14.9 13.0 - 17.0 g/dL   HCT 43.6 39.0 - 52.0 %   MCV 98.4 80.0 - 100.0 fL   MCH 33.6 26.0 - 34.0 pg   MCHC 34.2 30.0 - 36.0 g/dL   RDW 12.0 11.5 - 15.5 %   Platelets 295 150 - 400 K/uL   nRBC 0.0 0.0 - 0.2 %   Neutrophils Relative % 73 %   Neutro Abs 8.9 (H) 1.7 - 7.7 K/uL   Lymphocytes Relative 13 %   Lymphs Abs 1.5 0.7 - 4.0 K/uL   Monocytes Relative 8 %   Monocytes Absolute 0.9 0.1 - 1.0 K/uL   Eosinophils Relative 4 %   Eosinophils Absolute 0.5 0.0 - 0.5 K/uL   Basophils Relative 1 %   Basophils Absolute 0.1 0.0 - 0.1 K/uL   Immature Granulocytes 1 %   Abs Immature Granulocytes 0.10 (H) 0.00 - 0.07 K/uL     Comment: Performed at [facility] [address], [place], [place] [address]  Troponin I (High Sensitivity)     Status: None   Collection Time: 07/14/19  2:40 PM  Result Value Ref Range   Troponin I (High Sensitivity) 8 <18 ng/L    Comment: (NOTE) Elevated high sensitivity troponin I (hsTnI) values and significant  changes across serial measurements may suggest ACS but many other  chronic and acute conditions are known to elevate hsTnI results.  Refer to the "Links" section for chest pain algorithms and additional  guidance. Performed at [facility], [place] [address], [place], [place] [address]   Brain natriuretic peptide     Status: Abnormal   Collection Time: 07/14/19  2:41 PM  Result Value Ref Range   B Natriuretic Peptide 586.2 (H) 0.0 - 100.0 pg/mL    Comment: Performed at [facility] [address], [place], [place] [address]  Lactic acid, plasma     Status: Abnormal   Collection Time: 07/14/19  2:42 PM  Result Value Ref Range   Lactic Acid, Venous 2.7 (HH) 0.5 - 1.9 mmol/L    Comment: CRITICAL RESULT CALLED TO, READ BACK BY AND VERIFIED WITH: P.PULLIAM,RN 1528 07/14/2019 CLARK,S Performed at [facility], [place] [address] [place], [place] [address]   Lactic acid, plasma     Status: None   Collection Time: 07/14/19  4:18 PM  Result Value Ref Range   Lactic Acid, Venous 1.7 0.5 - 1.9 mmol/L    Comment: Performed at [facility] [address], [place], [place] [address]  Troponin I (High Sensitivity)     Status: None   Collection Time: 07/14/19  4:18 PM  Result Value Ref Range   Troponin I (High Sensitivity) 8 <18 ng/L    Comment: (NOTE) Elevated high sensitivity troponin I (hsTnI) values and significant  changes across serial measurements may suggest ACS but many other  chronic and acute conditions are known to elevate hsTnI results.  Refer to the "Links" section for chest pain algorithms and additional  guidance. Performed at [facility], [place] [address], [place], [place] [address]    Ct Angio Chest Pe W And/or Wo Contrast  Result Date: 07/14/2019 CLINICAL DATA:  Per is [facility] caregiver pt is here with multiple complaints, SOB with tremors x2 days. Pt also has "severe eczema" flare up. Pt uses 2L [place] at night, pt sats 76% in triage, no O2 tank with him. EXAM: CT ANGIOGRAPHY CHEST WITH CONTRAST TECHNIQUE: Multidetector CT imaging of the chest was performed using the standard protocol during bolus administration of intravenous contrast. Multiplanar CT image reconstructions and MIPs were obtained to evaluate the vascular anatomy. CONTRAST:  54mL OMNIPAQUE IOHEXOL 350 MG/ML SOLN COMPARISON:  Chest x-ray on 07/14/2019 FINDINGS: Cardiovascular: The pulmonary arteries are well opacified. There is no acute pulmonary embolus. Heart is enlarged. Dilated LEFT atrium. Pulmonary venous congestion. There is mitral annulus calcification or prior repair. Extensive atherosclerotic calcification of the coronary arteries. There is atherosclerosis of the thoracic aorta not associated with aneurysm. No evidence for dissection. Mediastinum/Nodes: There is a esophageal wall thickening throughout its thoracic course. Large hiatal hernia. Low-attenuation lesion is identified within the LEFT lobe of the thyroid gland, measuring 5.7 x 4.1 centimeters. Mural nodule is identified along its inferior aspect which measures 1.1 centimeters. The mass displaces the trachea towards the RIGHT. Visualized RIGHT lobe of the thyroid gland is normal in appearance. Precarinal lymph node is 1.2 centimeters. Small RIGHT paratracheal lymph nodes are also present. Lungs/Pleura: RIGHT pleural effusion and RIGHT basilar consolidation. Small LEFT pleural effusion and basilar atelectasis. Subpleural reticular changes are identified within the dependent aspects of the lung. There is septal thickening and the findings are consistent with pulmonary edema. Upper Abdomen: Gallbladder is present.  Large hiatal hernia. Musculoskeletal: Remote wedge compression fracture of T5. No acute osseous abnormality. Review of  the MIP images confirms the above findings. IMPRESSION: 1. Technically adequate exam showing no acute pulmonary embolus. 2. Marked dilatation of the LEFT atrium. There is high attenuation material in the region of the mitral annulus, consistent prior repair or extensive calcification. 3. Bilateral pleural effusions, pulmonary venous congestion, and parenchymal changes of pulmonary edema. 4. RIGHT LOWER lobe consolidation or atelectasis. LEFT LOWER lobe atelectasis. 5. coronary artery disease. 6. Large hiatal hernia. Diffuse esophageal wall thickening, raising the question of esophagitis. 7. LEFT thyroid mass measuring 5.7 cm. Recommend further evaluation with thyroid ultrasound. 8. Remote T5 wedge compression fracture. 9. Aortic Atherosclerosis (ICD10-I70.0). Electronically Signed   By: Nolon Nations M.D.   On: 07/14/2019 17:30   Dg Chest Portable 1 View  Result Date: 07/14/2019 CLINICAL DATA:  Per is home health caregiver pt is here with multiple complaints, SOB with tremors x2 days. Pt also has "severe eczema" flare up. Pt uses 2L [place] at night, pt sats 76% in triage, no O2 tank with him. sob EXAM: PORTABLE CHEST 1 VIEW COMPARISON:  05/02/2019 FINDINGS: There is stable elevation of RIGHT hemidiaphragm. The heart size is normal. Stable hiatal hernia. The lungs are free of focal consolidations and pleural effusions. No pulmonary edema. Remote rib fractures. Chronic changes in the RIGHT shoulder. IMPRESSION: 1. No evidence for acute cardiopulmonary abnormality. 2. Stable hiatal hernia. Electronically Signed   By: Nolon Nations M.D.   On: 07/14/2019 14:22    Pending Labs Unresulted Labs (From admission, onward)    Start     Ordered   07/21/19 0500  Creatinine, serum  (enoxaparin (LOVENOX)    CrCl >/= 30 ml/min)  Weekly,   R    Comments: while on enoxaparin therapy    07/14/19 2125    07/15/19 0500  CBC  Tomorrow morning,   R     07/14/19 2125   07/14/19 2120  Legionella Pneumophila Serogp 1 Ur Ag  Once,   STAT     07/14/19 2125   07/14/19 2120  Strep pneumoniae urinary antigen  Once,   STAT     07/14/19 2125   07/14/19 2118  HIV antibody (Routine Screening)  Once,   STAT     07/14/19 2125   07/14/19 1418  Culture, blood (routine x 2)  BLOOD CULTURE X 2,   STAT     07/14/19 1417          Vitals/Pain Today's Vitals   07/15/19 0030 07/15/19 0045 07/15/19 0115 07/15/19 0130  BP: (!) 118/92 112/66 112/68 110/69  Pulse:  98 90 92  Resp:   (!) 23 (!) 22  Temp:      TempSrc:      SpO2:  97% 94% 96%  PainSc:        Isolation Precautions No active isolations  Medications Medications  enoxaparin (LOVENOX) injection 40 mg (40 mg Subcutaneous Given 07/14/19 2303)  sodium chloride flush (NS) 0.9 % injection 3 mL (has no administration in time range)  0.9 %  sodium chloride infusion (has no administration in time range)  methylPREDNISolone sodium succinate (SOLU-MEDROL) 125 mg/2 mL injection 60 mg (60 mg Intravenous Given 07/15/19 0136)  albuterol (VENTOLIN HFA) 108 (90 Base) MCG/ACT inhaler 2 puff (2 puffs Inhalation Given 07/14/19 2259)  albuterol (VENTOLIN HFA) 108 (90 Base) MCG/ACT inhaler 2 puff (has no administration in time range)  0.9 %  sodium chloride infusion ( Intravenous New Bag/Given 07/14/19 2255)  methylPREDNISolone sodium succinate (SOLU-MEDROL) 125 mg/2 mL injection 125 mg (125  mg Intravenous Given 07/14/19 1608)  albuterol (VENTOLIN HFA) 108 (90 Base) MCG/ACT inhaler 4 puff (4 puffs Inhalation Given 07/14/19 1608)  AeroChamber Plus Flo-Vu Large MISC 1 each (1 each Other Given 07/14/19 1609)  AeroChamber Plus Flo-Vu Large MISC (1 each  Given 07/14/19 1609)  iohexol (OMNIPAQUE) 350 MG/ML injection 100 mL (52 mLs Intravenous Contrast Given 07/14/19 1702)  ceFEPIme (MAXIPIME) 2 g in sodium chloride 0.9 % 100 mL IVPB (0 g Intravenous Stopped 07/14/19 2341)   vancomycin (VANCOCIN) 1,250 mg in sodium chloride 0.9 % 250 mL IVPB (0 mg Intravenous Stopped 07/15/19 0134)    Mobility walks Low fall risk   Focused Assessments Cardiac Assessment Handoff:  Cardiac Rhythm: Normal sinus rhythm Lab Results  Component Value Date   CKTOTAL 1,510 (H) 07/04/2018   CKMB 20.5 (H) 07/04/2018   TROPONINI <0.03 01/17/2018   No results found for: DDIMER Does the Patient currently have chest pain? No     R Recommendations: See Admitting Provider Note  Report given to:   Additional Notes:

## 2019-07-15 NOTE — Progress Notes (Signed)
PHARMACY - PHYSICIAN COMMUNICATION CRITICAL VALUE ALERT - BLOOD CULTURE IDENTIFICATION (BCID)  [name] is an 85 y.o. male who presented to [facility] on 07/14/2019 with a chief complaint of SOB >> admitted for PNA.  Assessment:  Started on broad-spectrum ABX for PNA, now 2/4 bottles of blood cx growing MSSA.  Name of physician (or Provider) Contacted: KSchorr via text page  Current antibiotics: Vancomycin and cefepime  Changes to prescribed antibiotics recommended:  No changes to ABX for now during further w/u but when indicated could de-escalate to Ancef 2g IV Q8H.  Results for orders placed or performed during the hospital encounter of 07/14/19  Blood Culture ID Panel (Reflexed) (Collected: 07/14/2019  2:18 PM)  Result Value Ref Range   Enterococcus species NOT DETECTED NOT DETECTED   Listeria monocytogenes NOT DETECTED NOT DETECTED   Staphylococcus species DETECTED (A) NOT DETECTED   Staphylococcus aureus (BCID) DETECTED (A) NOT DETECTED   Methicillin resistance NOT DETECTED NOT DETECTED   Streptococcus species NOT DETECTED NOT DETECTED   Streptococcus agalactiae NOT DETECTED NOT DETECTED   Streptococcus pneumoniae NOT DETECTED NOT DETECTED   Streptococcus pyogenes NOT DETECTED NOT DETECTED   Acinetobacter baumannii NOT DETECTED NOT DETECTED   Enterobacteriaceae species NOT DETECTED NOT DETECTED   Enterobacter cloacae complex NOT DETECTED NOT DETECTED   Escherichia coli NOT DETECTED NOT DETECTED   Klebsiella oxytoca NOT DETECTED NOT DETECTED   Klebsiella pneumoniae NOT DETECTED NOT DETECTED   Proteus species NOT DETECTED NOT DETECTED   Serratia marcescens NOT DETECTED NOT DETECTED   Haemophilus influenzae NOT DETECTED NOT DETECTED   Neisseria meningitidis NOT DETECTED NOT DETECTED   Pseudomonas aeruginosa NOT DETECTED NOT DETECTED   Candida albicans NOT DETECTED NOT DETECTED   Candida glabrata NOT DETECTED NOT DETECTED   Candida krusei NOT DETECTED NOT DETECTED   Candida  parapsilosis NOT DETECTED NOT DETECTED   Candida tropicalis NOT DETECTED NOT DETECTED    Wynona Neat, PharmD, BCPS  07/15/2019  5:49 AM

## 2019-07-15 NOTE — Progress Notes (Signed)
Bilateral lower extremity venous duplex completed. Refer to "CV Proc" under chart review to view preliminary results.  07/15/2019 3:04 PM Maudry Mayhew, MHA, RVT, RDCS, RDMS

## 2019-07-15 NOTE — Progress Notes (Signed)
Echocardiogram 2D Echocardiogram has been performed.  Oneal Deputy Airon Sahni 07/15/2019, 12:04 PM

## 2019-07-16 DIAGNOSIS — R0902 Hypoxemia: Secondary | ICD-10-CM

## 2019-07-16 DIAGNOSIS — M7989 Other specified soft tissue disorders: Secondary | ICD-10-CM

## 2019-07-16 DIAGNOSIS — D72829 Elevated white blood cell count, unspecified: Secondary | ICD-10-CM

## 2019-07-16 LAB — CBC WITH DIFFERENTIAL/PLATELET
Abs Immature Granulocytes: 0.15 10*3/uL — ABNORMAL HIGH (ref 0.00–0.07)
Basophils Absolute: 0 10*3/uL (ref 0.0–0.1)
Basophils Relative: 0 %
Eosinophils Absolute: 0 10*3/uL (ref 0.0–0.5)
Eosinophils Relative: 0 %
HCT: 39 % (ref 39.0–52.0)
Hemoglobin: 12.8 g/dL — ABNORMAL LOW (ref 13.0–17.0)
Immature Granulocytes: 1 %
Lymphocytes Relative: 9 %
Lymphs Abs: 1.7 10*3/uL (ref 0.7–4.0)
MCH: 32.3 pg (ref 26.0–34.0)
MCHC: 32.8 g/dL (ref 30.0–36.0)
MCV: 98.5 fL (ref 80.0–100.0)
Monocytes Absolute: 1 10*3/uL (ref 0.1–1.0)
Monocytes Relative: 5 %
Neutro Abs: 16 10*3/uL — ABNORMAL HIGH (ref 1.7–7.7)
Neutrophils Relative %: 85 %
Platelets: 291 10*3/uL (ref 150–400)
RBC: 3.96 MIL/uL — ABNORMAL LOW (ref 4.22–5.81)
RDW: 12.3 % (ref 11.5–15.5)
WBC: 18.8 10*3/uL — ABNORMAL HIGH (ref 4.0–10.5)
nRBC: 0 % (ref 0.0–0.2)

## 2019-07-16 LAB — BASIC METABOLIC PANEL
Anion gap: 10 (ref 5–15)
BUN: 18 mg/dL (ref 8–23)
CO2: 30 mmol/L (ref 22–32)
Calcium: 8.4 mg/dL — ABNORMAL LOW (ref 8.9–10.3)
Chloride: 99 mmol/L (ref 98–111)
Creatinine, Ser: 1.27 mg/dL — ABNORMAL HIGH (ref 0.61–1.24)
GFR calc Af Amer: 59 mL/min — ABNORMAL LOW (ref >60)
GFR calc non Af Amer: 51 mL/min — ABNORMAL LOW (ref >60)
Glucose, Bld: 129 mg/dL — ABNORMAL HIGH (ref 70–99)
Potassium: 4 mmol/L (ref 3.5–5.1)
Sodium: 139 mmol/L (ref 135–145)

## 2019-07-16 LAB — TSH: TSH: 0.582 u[IU]/mL (ref 0.350–4.500)

## 2019-07-16 MED ORDER — BUDESONIDE 0.25 MG/2ML IN SUSP
0.2500 mg | Freq: Two times a day (BID) | RESPIRATORY_TRACT | Status: DC
  Filled 2019-07-16 (×5): qty 2

## 2019-07-16 NOTE — Progress Notes (Signed)
PROGRESS NOTE  [name]  DOB: [birth year]  PCP: Rutherford Guys, MD [id number]  DOA: 07/14/2019  LOS: 2 days   Brief narrative: Patient is an 85 year old male who presented with hypoxemia.  He does have significant past medical history for hypertension, dyslipidemia, history of stroke, eczema and asthma.  Patient lives at home, he has a caregiver who noticed him having worsening lower extremity edema along with diffuse eczema.  Patient was taken to the urgent care where he was noted to be hypoxic and edematous and referred to the hospital for further evaluation.  On his initial physical examination his temperature was 98.8, pulse rate 97, respiratory rate 28, blood pressure 120/90, oxygen saturation 76% on room air.  His lungs had rales bilaterally, positive wheezing, heart S1-S2 present and rhythmic, positive systolic murmur at the base, abdomen soft, 3+ bilateral extremity edema. Sodium 136, potassium 4.5, chloride 102, bicarb 24, glucose 100, BUN 10, creatinine 1.0, BNP 586, troponin 8, white count 11.9, hemoglobin 14.9, hematocrit 43.6, platelets 195.  SARS COVID-19 was negative.   Chest radiograph has right bibasilar more right than left basal atelectasis.   CT chest with bilateral groundglass opacities, bilateral pleural effusions.  Right and left, right atelectasis, left thyroid mass 5.7 cm  Patient was admitted to the hospital with a working diagnosis of acute hypoxic respiratory failure due to pulmonary edema/bilateral pleural effusions likely diastolic heart failure/rule out community-acquired pneumonia. Blood culture obtained on admission ultimately showed MSSA.  Subjective: Patient was seen and examined this morning.  Pleasant elderly Caucasian male.  Lying down in bed.  Not in distress.  No new symptoms.  Assessment/Plan:  1. Acute hypoxic respiratory failure due to diastolic heart failure exacerbation. Patient presented with hypervolemia.  He was started on Lasix IV 40 mg  twice daily.  Echocardiogram showed 55 to 60% EF, no vegetation.  I stopped IV Lasix this morning because of creatinine elevation.    2. MSSA bacteremia, in the setting of generalized erythematous rash/ pruriginous. Blood cultures resulted positive for MSSA bacteremia, 2 bottles. Antibiotic narrowed to IV cefazolin.  Echocardiogram did not show any vegetation.  ID consult appreciated.  Repeat blood culture ordered.  To decide the duration of biotics.   3. HTN. Continue metoprolol for blood pressure control.   4. Incidental thyroid nodule.  Ultrasonography with asymmetric thyromegaly secondary to complex cystic lesion, check TSH in am.   5. Asthma. No clinical signs of exacerbation, will discontinue steroids and will continue bronchodilator therapy as needed.   6. Hx of CVA. Continue with clopidogrel and pravastatin.   Patient ruled out for pneumonia  DVT prophylaxis: enoxaparin   Code Status:   DNR Family Communication: no family at the bedside  Disposition Plan/ discharge barriers: pending clinical improvement,   Consultants:  Infectious disease  Procedures:    Antimicrobials: Anti-infectives (From admission, onward)   Start     Dose/Rate Route Frequency Ordered Stop   07/16/19 0000  vancomycin (VANCOCIN) IVPB 1000 mg/200 mL premix  Status:  Discontinued     1,000 mg 200 mL/hr over 60 Minutes Intravenous Every 24 hours 07/15/19 0547 07/15/19 0924   07/15/19 1700  ceFAZolin (ANCEF) IVPB 2g/100 mL premix     2 g 200 mL/hr over 30 Minutes Intravenous Every 8 hours 07/15/19 0928     07/15/19 1000  ceFEPIme (MAXIPIME) 2 g in sodium chloride 0.9 % 100 mL IVPB  Status:  Discontinued     2 g 200 mL/hr over 30 Minutes Intravenous Every  12 hours 07/15/19 0547 07/15/19 0924   07/14/19 2145  ceFEPIme (MAXIPIME) 2 g in sodium chloride 0.9 % 100 mL IVPB     2 g 200 mL/hr over 30 Minutes Intravenous  Once 07/14/19 2137 07/14/19 2341   07/14/19 2145  vancomycin (VANCOCIN) 1,250 mg  in sodium chloride 0.9 % 250 mL IVPB     1,250 mg 166.7 mL/hr over 90 Minutes Intravenous  Once 07/14/19 2137 07/15/19 0134      Diet Order            Diet Heart Room service appropriate? Yes; Fluid consistency: Thin  Diet effective now              Infusions:  . sodium chloride    .  ceFAZolin (ANCEF) IV 2 g (07/16/19 0515)    Scheduled Meds: . albuterol  3 mL Inhalation BID  . budesonide  0.25 mg Inhalation BID  . clopidogrel  75 mg Oral Daily  . enoxaparin (LOVENOX) injection  40 mg Subcutaneous Q24H  . metoprolol succinate  50 mg Oral Daily  . pravastatin  40 mg Oral q1800  . traZODone  50 mg Oral QHS    PRN meds: sodium chloride, albuterol, docusate sodium, sodium chloride flush   Objective: Vitals:   07/15/19 2341 07/16/19 0748  BP: (!) 146/81 135/76  Pulse: 93 86  Resp:  20  Temp: 97.9 F (36.6 C) 97.8 F (36.6 C)  SpO2: 96% 97%    Intake/Output Summary (Last 24 hours) at 07/16/2019 1423 Last data filed at 07/16/2019 0700 Gross per 24 hour  Intake 55.67 ml  Output 1800 ml  Net -1744.33 ml   Filed Weights   07/15/19 0321  Weight: 71.9 kg   Weight change:  Body mass index is 22.74 kg/m.   Physical Exam: General exam: Appears calm and comfortable.  Skin: No rashes, lesions or ulcers. HEENT: Atraumatic, normocephalic, supple neck, no obvious bleeding Lungs: Clear to auscultation bilaterally CVS: Regular rate and rhythm, no murmur GI/Abd soft, nontender, nondistended, bowel sound present CNS: Alert, awake, oriented x3 Psychiatry: Mood appropriate Extremities: Trace to 1+ bilateral pedal edema, shiny lower legs.  No evidence of cellulitis.  Patient does not complain of any pain.  No calf tenderness  Data Review: I have personally reviewed the laboratory data and studies available.  Recent Labs  Lab 07/14/19 1440 07/15/19 0427 07/16/19 0358  WBC 11.9* 9.4 18.8*  NEUTROABS 8.9*  --  16.0*  HGB 14.9 12.7* 12.8*  HCT 43.6 39.1 39.0  MCV  98.4 98.7 98.5  PLT 295 281 291   Recent Labs  Lab 07/14/19 1440 07/16/19 0358  NA 136 139  K 4.5 4.0  CL 102 99  CO2 24 30  GLUCOSE 100* 129*  BUN 10 18  CREATININE 1.01 1.27*  CALCIUM 8.4* 8.4*    Terrilee Croak, MD  [facility] 07/16/2019

## 2019-07-16 NOTE — Evaluation (Signed)
Physical Therapy Evaluation Patient Details Name: [name] MRN: [id number] DOB: December 31, 1931 Today's Date: 07/16/2019   History of Present Illness  Patient is a 85 y/o male admitted with dyspnea, SpO2 70's in ED, and positive MSSA bacteremia.. Past medical history significant of seasonal allergies, basal cell carcinoma, hyperlipidemia, hypertension, osteoporosis and eczema.  Clinical Impression  Patient presents with decreased independence with mobility due to decreased balance, decreased endurance with HR 113, SpO2 94% with ambulation and decreased safety awareness with LOB turning to get to chair with max A for safely lowering to chair.  Feel he will benefit from skilled PT in the acute setting prior to d/c home with follow up HHPT and 24 hour caregivers.      Follow Up Recommendations Supervision/Assistance - 24 hour    Equipment Recommendations  None recommended by PT    Recommendations for Other Services       Precautions / Restrictions Precautions Precautions: Fall      Mobility  Bed Mobility Overal bed mobility: Needs Assistance Bed Mobility: Supine to Sit     Supine to sit: Supervision;HOB elevated     General bed mobility comments: increased time  Transfers Overall transfer level: Needs assistance Equipment used: Rolling walker (2 wheeled) Transfers: Sit to/from Stand Sit to Stand: Min assist;Max assist         General transfer comment: cues for hand placement as initially trying to pull up on walker with both hands, assist for balance; turning to sit on chair in room LOB and assisted to lower to chair  Ambulation/Gait Ambulation/Gait assistance: Min assist Gait Distance (Feet): 180 Feet Assistive device: Rolling walker (2 wheeled) Gait Pattern/deviations: Step-through pattern;Decreased stride length;Shuffle;Trunk flexed;Drifts right/left     General Gait Details: veering to sides and getting walker too far ahead at times, kicking R walker leg and noted  unsteady with turns (reported has to be careful on turns cause he will fall, then LOB and lowered to chair turning around to get to recliner in the room)  Stairs            Wheelchair Mobility    Modified Rankin (Stroke Patients Only)       Balance Overall balance assessment: Needs assistance   Sitting balance-Leahy Scale: Fair     Standing balance support: Bilateral upper extremity supported Standing balance-Leahy Scale: Poor Standing balance comment: UE support for balance                             Pertinent Vitals/Pain Pain Assessment: No/denies pain    Home Living Family/patient expects to be discharged to:: Private residence Living Arrangements: Alone Available Help at Discharge: Available 24 hours/day;Personal care attendant Type of Home: House Home Access: Stairs to enter Entrance Stairs-Rails: Right Entrance Stairs-Number of Steps: 8 Home Layout: One level Home Equipment: Tub bench;Cane - single point;Walker - 2 wheels;Bedside commode;Hand held shower head Additional Comments: repotrts caregiver stays at the house    Prior Function                 Hand Dominance        Extremity/Trunk Assessment        Lower Extremity Assessment Lower Extremity Assessment: Generalized weakness    Cervical / Trunk Assessment Cervical / Trunk Assessment: Kyphotic  Communication   Communication: HOH  Cognition Arousal/Alertness: Awake/alert Behavior During Therapy: WFL for tasks assessed/performed Overall Cognitive Status: Within Functional Limits for tasks assessed  General Comments General comments (skin integrity, edema, etc.): redness noted over legs, arms and trunk noted h/o eczema    Exercises     Assessment/Plan    PT Assessment    PT Problem List         PT Treatment Interventions      PT Goals (Current goals can be found in the Care Plan section)  Acute Rehab PT  Goals Patient Stated Goal: To go home PT Goal Formulation: With patient Time For Goal Achievement: 07/30/19 Potential to Achieve Goals: Good    Frequency     Barriers to discharge        Co-evaluation               AM-PAC PT "6 Clicks" Mobility  Outcome Measure Help needed turning from your back to your side while in a flat bed without using bedrails?: None Help needed moving from lying on your back to sitting on the side of a flat bed without using bedrails?: None Help needed moving to and from a bed to a chair (including a wheelchair)?: A Little Help needed standing up from a chair using your arms (e.g., wheelchair or bedside chair)?: A Little Help needed to walk in hospital room?: A Little Help needed climbing 3-5 steps with a railing? : A Little 6 Click Score: 20    End of Session Equipment Utilized During Treatment: Gait belt Activity Tolerance: Patient tolerated treatment well Patient left: in chair;with chair alarm set   PT Visit Diagnosis: Other abnormalities of gait and mobility (R26.89);Muscle weakness (generalized) (M62.81);History of falling (Z91.81)    Time: [id number] PT Time Calculation (min) (ACUTE ONLY): 25 min   Charges:   PT Evaluation $PT Eval Moderate Complexity: 1 Mod PT Treatments $Gait Training: 8-22 mins        Magda Kiel, PT Acute Rehabilitation Services [phone] 07/16/2019   [name] 07/16/2019, 1:58 PM

## 2019-07-16 NOTE — Care Management Important Message (Signed)
Important Message  Patient Details  Name: [name] MRN: [id number] Date of Birth: 07-08-1932   Medicare Important Message Given:  Yes     [name] 07/16/2019, 2:52 PM

## 2019-07-16 NOTE — Plan of Care (Signed)

## 2019-07-16 NOTE — Progress Notes (Signed)
[facility] for Infectious Disease  Date of Admission:  07/14/2019     Total days of antibiotics 3         ASSESSMENT:  [name] has MSSA bacteremia of unclear source. TTE with small mobile density on the LVOT side of the aortic valve which was previously present and likely attributed to calcific degeneration. TEE to be ordered. Repeat blood cultures obtained today and are pending. He is feeling well and has had increased leukocytosis overnight. Hypoxia likely related to his restrictive lung disease as clinically he has no evidence of pneumonia at present. Will continue current dose of Cefazolin. He will likely require 6 weeks of antimicrobial therapy pending TEE results.   PLAN:  1. Continue cefazolin. 2. Monitor repeat cultures for clearance of bacteremia. 3. TEE order placed.   Dr. Johnnye Sima is available over the weekend for ID questions. Will follow up on Monday.    Principal Problem:   MSSA bacteremia Active Problems:   Acute respiratory failure with hypoxia (HCC)   Eczema   Dyslipidemia   Leg edema   Thyroid mass   Hypoxia   Peripheral edema   Thyroid nodule   . albuterol  3 mL Inhalation BID  . budesonide  0.25 mg Inhalation BID  . clopidogrel  75 mg Oral Daily  . enoxaparin (LOVENOX) injection  40 mg Subcutaneous Q24H  . metoprolol succinate  50 mg Oral Daily  . pravastatin  40 mg Oral q1800  . traZODone  50 mg Oral QHS    SUBJECTIVE:  Afebrile overnight with no acute events or concerns. Feeling good today.   Allergies  Allergen Reactions  . Other Shortness Of Breath, Itching and Other (See Comments)    Seasonal allergies- Runny nose, congestion, itchy eyes, and wheezing     Review of Systems: Review of Systems  Constitutional: Negative for chills, fever and weight loss.  Respiratory: Negative for cough, shortness of breath and wheezing.   Cardiovascular: Positive for leg swelling. Negative for chest pain.  Gastrointestinal: Negative for  abdominal pain, constipation, diarrhea, nausea and vomiting.  Skin: Negative for rash.    OBJECTIVE: Vitals:   07/15/19 1700 07/15/19 2241 07/15/19 2341 07/16/19 0748  BP: 124/80  (!) 146/81 135/76  Pulse: 97  93 86  Resp: 17   20  Temp: 98.8 F (37.1 C)  97.9 F (36.6 C) 97.8 F (36.6 C)  TempSrc: Oral  Oral Oral  SpO2: 98% 98% 96% 97%  Weight:      Height:       Body mass index is 22.74 kg/m.  Physical Exam Constitutional:      General: He is not in acute distress.    Appearance: Normal appearance. He is well-developed.     Comments: Lying in bed with head of bed elevated; pleasant.   Cardiovascular:     Rate and Rhythm: Normal rate and regular rhythm.     Heart sounds: Normal heart sounds.  Pulmonary:     Effort: Pulmonary effort is normal.     Breath sounds: Normal breath sounds.  Musculoskeletal:     Right lower leg: 2+ Pitting Edema present.     Left lower leg: 2+ Pitting Edema present.  Skin:    General: Skin is warm and dry.  Neurological:     Mental Status: He is alert and oriented to person, place, and time.  Psychiatric:        Behavior: Behavior normal.        Thought  Content: Thought content normal.        Judgment: Judgment normal.     Lab Results Lab Results  Component Value Date   WBC 18.8 (H) 07/16/2019   HGB 12.8 (L) 07/16/2019   HCT 39.0 07/16/2019   MCV 98.5 07/16/2019   PLT 291 07/16/2019    Lab Results  Component Value Date   CREATININE 1.27 (H) 07/16/2019   BUN 18 07/16/2019   NA 139 07/16/2019   K 4.0 07/16/2019   CL 99 07/16/2019   CO2 30 07/16/2019    Lab Results  Component Value Date   ALT 13 04/30/2019   AST 20 04/30/2019   ALKPHOS 76 04/30/2019   BILITOT 1.1 04/30/2019     Microbiology: Recent Results (from the past 240 hour(s))  Culture, blood (routine x 2)     Status: Abnormal (Preliminary result)   Collection Time: 07/14/19  2:18 PM   Specimen: BLOOD  Result Value Ref Range Status   Specimen Description  BLOOD RIGHT ANTECUBITAL  Final   Special Requests   Final    BOTTLES DRAWN AEROBIC AND ANAEROBIC Blood Culture adequate volume   Culture  Setup Time   Final    GRAM POSITIVE COCCI IN BOTH AEROBIC AND ANAEROBIC BOTTLES CRITICAL RESULT CALLED TO, READ BACK BY AND VERIFIED WITH: PHRMD V BRYK @0545  07/15/19 BY S GEZAHEGN    Culture (A)  Final    STAPHYLOCOCCUS AUREUS SUSCEPTIBILITIES TO FOLLOW Performed at [facility], [place] [address], [place], [place] [address]    Report Status PENDING  Incomplete  Blood Culture ID Panel (Reflexed)     Status: Abnormal   Collection Time: 07/14/19  2:18 PM  Result Value Ref Range Status   Enterococcus species NOT DETECTED NOT DETECTED Final   Listeria monocytogenes NOT DETECTED NOT DETECTED Final   Staphylococcus species DETECTED (A) NOT DETECTED Final    Comment: CRITICAL RESULT CALLED TO, READ BACK BY AND VERIFIED WITH: PHRMD V BRYK @0545  07/15/19 BY S GEZAHEGN    Staphylococcus aureus (BCID) DETECTED (A) NOT DETECTED Final    Comment: Methicillin (oxacillin) susceptible Staphylococcus aureus (MSSA). Preferred therapy is anti staphylococcal beta lactam antibiotic (Cefazolin or Nafcillin), unless clinically contraindicated. CRITICAL RESULT CALLED TO, READ BACK BY AND VERIFIED WITH: PHRMD V BRYK @0545  07/15/19 BY S GEZAHEGN    Methicillin resistance NOT DETECTED NOT DETECTED Final   Streptococcus species NOT DETECTED NOT DETECTED Final   Streptococcus agalactiae NOT DETECTED NOT DETECTED Final   Streptococcus pneumoniae NOT DETECTED NOT DETECTED Final   Streptococcus pyogenes NOT DETECTED NOT DETECTED Final   Acinetobacter baumannii NOT DETECTED NOT DETECTED Final   Enterobacteriaceae species NOT DETECTED NOT DETECTED Final   Enterobacter cloacae complex NOT DETECTED NOT DETECTED Final   Escherichia coli NOT DETECTED NOT DETECTED Final   Klebsiella oxytoca NOT DETECTED NOT DETECTED Final   Klebsiella pneumoniae NOT DETECTED NOT DETECTED Final    Proteus species NOT DETECTED NOT DETECTED Final   Serratia marcescens NOT DETECTED NOT DETECTED Final   Haemophilus influenzae NOT DETECTED NOT DETECTED Final   Neisseria meningitidis NOT DETECTED NOT DETECTED Final   Pseudomonas aeruginosa NOT DETECTED NOT DETECTED Final   Candida albicans NOT DETECTED NOT DETECTED Final   Candida glabrata NOT DETECTED NOT DETECTED Final   Candida krusei NOT DETECTED NOT DETECTED Final   Candida parapsilosis NOT DETECTED NOT DETECTED Final   Candida tropicalis NOT DETECTED NOT DETECTED Final    Comment: Performed at [facility],  [address], [place], [place] [address]  SARS CORONAVIRUS 2 (TAT 6-24 HRS) Nasopharyngeal Nasopharyngeal Swab     Status: None   Collection Time: 07/14/19  2:27 PM   Specimen: Nasopharyngeal Swab  Result Value Ref Range Status   SARS Coronavirus 2 NEGATIVE NEGATIVE Final    Comment: (NOTE) SARS-CoV-2 target nucleic acids are NOT DETECTED. The SARS-CoV-2 RNA is generally detectable in upper and lower respiratory specimens during the acute phase of infection. Negative results do not preclude SARS-CoV-2 infection, do not rule out co-infections with other pathogens, and should not be used as the sole basis for treatment or other patient management decisions. Negative results must be combined with clinical observations, patient history, and epidemiological information. The expected result is Negative. Fact Sheet for Patients: SugarRoll.be Fact Sheet for Healthcare Providers: https://www.woods-mathews.com/ This test is not yet approved or cleared by the [place] FDA and  has been authorized for detection and/or diagnosis of SARS-CoV-2 by FDA under an Emergency Use Authorization (EUA). This EUA will remain  in effect (meaning this test can be used) for the duration of the COVID-19 declaration under Section 56 4(b)(1) of the Act, 21 U.S.C. section 360bbb-3(b)(1), unless the  authorization is terminated or revoked sooner. Performed at [facility], [place] [address], [place], [place] [address]   Culture, blood (routine x 2)     Status: Abnormal (Preliminary result)   Collection Time: 07/14/19  9:58 PM   Specimen: BLOOD RIGHT HAND  Result Value Ref Range Status   Specimen Description BLOOD RIGHT HAND  Final   Special Requests   Final    BOTTLES DRAWN AEROBIC AND ANAEROBIC Blood Culture adequate volume   Culture  Setup Time   Final    GRAM POSITIVE COCCI ANAEROBIC BOTTLE ONLY CRITICAL VALUE NOTED.  VALUE IS CONSISTENT WITH PREVIOUSLY REPORTED AND CALLED VALUE.    Culture (A)  Final    STAPHYLOCOCCUS AUREUS SUSCEPTIBILITIES TO FOLLOW Performed at [facility], [place] [address] [place], [place] [address]    Report Status PENDING  Incomplete     Terri Piedra, [place] [facility] [phone] Pager  07/16/2019  12:09 PM

## 2019-07-17 LAB — BASIC METABOLIC PANEL
Anion gap: 11 (ref 5–15)
BUN: 18 mg/dL (ref 8–23)
CO2: 28 mmol/L (ref 22–32)
Calcium: 7.9 mg/dL — ABNORMAL LOW (ref 8.9–10.3)
Chloride: 99 mmol/L (ref 98–111)
Creatinine, Ser: 1.09 mg/dL (ref 0.61–1.24)
GFR calc Af Amer: >60 mL/min (ref >60)
GFR calc non Af Amer: >60 mL/min (ref >60)
Glucose, Bld: 103 mg/dL — ABNORMAL HIGH (ref 70–99)
Potassium: 4.7 mmol/L (ref 3.5–5.1)
Sodium: 138 mmol/L (ref 135–145)

## 2019-07-17 LAB — CULTURE, BLOOD (ROUTINE X 2)
Special Requests: ADEQUATE
Special Requests: ADEQUATE

## 2019-07-17 LAB — CBC WITH DIFFERENTIAL/PLATELET
Abs Immature Granulocytes: 0.15 10*3/uL — ABNORMAL HIGH (ref 0.00–0.07)
Basophils Absolute: 0.1 10*3/uL (ref 0.0–0.1)
Basophils Relative: 1 %
Eosinophils Absolute: 0.2 10*3/uL (ref 0.0–0.5)
Eosinophils Relative: 2 %
HCT: 39.5 % (ref 39.0–52.0)
Hemoglobin: 12.9 g/dL — ABNORMAL LOW (ref 13.0–17.0)
Immature Granulocytes: 1 %
Lymphocytes Relative: 18 %
Lymphs Abs: 2 10*3/uL (ref 0.7–4.0)
MCH: 32.3 pg (ref 26.0–34.0)
MCHC: 32.7 g/dL (ref 30.0–36.0)
MCV: 99 fL (ref 80.0–100.0)
Monocytes Absolute: 0.9 10*3/uL (ref 0.1–1.0)
Monocytes Relative: 8 %
Neutro Abs: 7.5 10*3/uL (ref 1.7–7.7)
Neutrophils Relative %: 70 %
Platelets: 251 10*3/uL (ref 150–400)
RBC: 3.99 MIL/uL — ABNORMAL LOW (ref 4.22–5.81)
RDW: 12.4 % (ref 11.5–15.5)
WBC: 10.9 10*3/uL — ABNORMAL HIGH (ref 4.0–10.5)
nRBC: 0 % (ref 0.0–0.2)

## 2019-07-17 LAB — MAGNESIUM: Magnesium: 2.1 mg/dL (ref 1.7–2.4)

## 2019-07-17 MED ORDER — ALBUTEROL SULFATE (2.5 MG/3ML) 0.083% IN NEBU: 3.0000 mL | INHALATION_SOLUTION | Freq: Four times a day (QID) | RESPIRATORY_TRACT | Status: DC | PRN

## 2019-07-17 MED ORDER — FUROSEMIDE 40 MG PO TABS
40.0000 mg | ORAL_TABLET | Freq: Every day | ORAL | Status: DC
  Administered 2019-07-17 – 2019-07-21 (×5): 40 mg via ORAL
  Filled 2019-07-17 (×5): qty 1

## 2019-07-17 NOTE — Progress Notes (Signed)
PROGRESS NOTE  [name]  DOB: [birth year]  PCP: Rutherford Guys, MD [id number]  DOA: 07/14/2019  LOS: 3 days   Brief narrative: Patient is an 85 year old male with past medical history of HTN, HLD, stroke, eczema and asthma. He was brought into the ED on 9/16 from home by his caregiver for worsening lower extremity edema along with diffuse eczema. Patient was taken to the urgent care where he was noted to be hypoxic and edematous and referred to the hospital for further evaluation.  In the ED, patient was hemodynamically stable.  Oxygen saturation was 76% on room air and was started on oxygen supplementation by nasal cannula. 3+ pedal edema on examination. SARS COVID-19 was negative.  CT chest showed bilateral groundglass opacities, bilateral pleural effusions.  Patient was admitted to the hospital with a working diagnosis of acute hypoxic respiratory failure due to pulmonary edema/bilateral pleural effusions likely diastolic heart failure/rule out community-acquired pneumonia. Blood culture obtained on admission ultimately showed MSSA.  Subjective: Patient was seen and examined this morning.  Pleasant elderly Caucasian male.  Lying down in bed.  Not in distress.  No new symptoms.  Assessment/Plan: Acute hypoxic respiratory failure Acute exacerbation of chronic diastolic congestive heart failure  -Presented with bilateral pedal edema, found to have pulmonary edema and bilateral pleural effusions -Started on Lasix IV 40 mg twice daily.   -Echocardiogram showed 55 to 60% EF, no vegetation.  -Initially required oxygen supplementation.  Currently maintaining oxygen saturation on room air. -Continue metoprolol 50 mg daily. -Held IV Lasix yesterday because of AKI.  Renal function is better today.  Resume Lasix oral at 40 mg daily.  MSSA bacteremia -in the setting of generalized erythematous rash/ pruriginous.  -Blood cultures obtained on admission resulted positive for MSSA  bacteremia in 2 bottles. -ID consult appreciated.  Echocardiogram did not show any vegetation.  Antibiotic narrowed to IV cefazolin.   Duration to be determined.  Repeat blood culture obtained on  9/18 did not show any growth.  AKI -Creatinine elevated to 1.27 on 9/18 with IV Lasix.  Lasix stopped.  Creatinine improved to normal today.  Continue to monitor.  HTN -Continue metoprolol and Lasix for blood pressure control.   Hx of CVA -Continue with clopidogrel and pravastatin.  Incidental thyroid nodule. -5.7 cm left thyroid mass noted in CT chest.  -Ultrasonographyshowed asymmetric thyromegaly secondary to complex cystic lesion.  TSH normal.   Asthma. No clinical signs of exacerbation.  Continue PRN bronchodilators  Impaired mobility -PT eval obtained.  Recommended home health PT with 24-hour supervision when ready for discharge.  DVT prophylaxis:enoxaparin Code Status:DNR Family Communication:no family at the bedside Disposition Plan/ discharge barriers:pending clinical improvement.   Consultants:  Infectious disease  Procedures:    Antimicrobials: Anti-infectives (From admission, onward)   Start     Dose/Rate Route Frequency Ordered Stop   07/16/19 0000  vancomycin (VANCOCIN) IVPB 1000 mg/200 mL premix  Status:  Discontinued     1,000 mg 200 mL/hr over 60 Minutes Intravenous Every 24 hours 07/15/19 0547 07/15/19 0924   07/15/19 1700  ceFAZolin (ANCEF) IVPB 2g/100 mL premix     2 g 200 mL/hr over 30 Minutes Intravenous Every 8 hours 07/15/19 0928     07/15/19 1000  ceFEPIme (MAXIPIME) 2 g in sodium chloride 0.9 % 100 mL IVPB  Status:  Discontinued     2 g 200 mL/hr over 30 Minutes Intravenous Every 12 hours 07/15/19 0547 07/15/19 0924   07/14/19 2145  ceFEPIme (MAXIPIME) 2  g in sodium chloride 0.9 % 100 mL IVPB     2 g 200 mL/hr over 30 Minutes Intravenous  Once 07/14/19 2137 07/14/19 2341   07/14/19 2145  vancomycin (VANCOCIN) 1,250 mg in sodium  chloride 0.9 % 250 mL IVPB     1,250 mg 166.7 mL/hr over 90 Minutes Intravenous  Once 07/14/19 2137 07/15/19 0134      Diet Order            Diet Heart Room service appropriate? Yes; Fluid consistency: Thin  Diet effective now              Infusions:  . sodium chloride    .  ceFAZolin (ANCEF) IV 2 g (07/17/19 0514)    Scheduled Meds: . budesonide  0.25 mg Inhalation BID  . clopidogrel  75 mg Oral Daily  . enoxaparin (LOVENOX) injection  40 mg Subcutaneous Q24H  . metoprolol succinate  50 mg Oral Daily  . pravastatin  40 mg Oral q1800  . traZODone  50 mg Oral QHS    PRN meds: sodium chloride, albuterol, albuterol, docusate sodium, sodium chloride flush   Objective: Vitals:   07/17/19 0012 07/17/19 1013  BP: 119/64 116/71  Pulse: 76 79  Resp:    Temp: (!) 97.5 F (36.4 C) 97.8 F (36.6 C)  SpO2: 96% 97%    Intake/Output Summary (Last 24 hours) at 07/17/2019 1030 Last data filed at 07/17/2019 0800 Gross per 24 hour  Intake 582.2 ml  Output 700 ml  Net -117.8 ml   Filed Weights   07/15/19 0321  Weight: 71.9 kg   Weight change:  Body mass index is 22.74 kg/m.   Physical Exam: General exam: Appears calm and comfortable.  Skin: No rashes, lesions or ulcers. HEENT: Atraumatic, normocephalic, supple neck, no obvious bleeding Lungs: Clear to auscultation bilaterally CVS: Regular rate and rhythm, mild systolic murmur GI/Abd soft, nontender, nondistended, bowel sound present CNS: Alert, awake, oriented x3 Psychiatry: Mood appropriate Extremities: Trace to 1+ bilateral pedal edema on both legs  Data Review: I have personally reviewed the laboratory data and studies available.  Recent Labs  Lab 07/14/19 1440 07/15/19 0427 07/16/19 0358 07/17/19 0803  WBC 11.9* 9.4 18.8* 10.9*  NEUTROABS 8.9*  --  16.0* 7.5  HGB 14.9 12.7* 12.8* 12.9*  HCT 43.6 39.1 39.0 39.5  MCV 98.4 98.7 98.5 99.0  PLT 295 281 291 251   Recent Labs  Lab 07/14/19 1440 07/16/19  0358 07/17/19 0803  NA 136 139 138  K 4.5 4.0 4.7  CL 102 99 99  CO2 24 30 28   GLUCOSE 100* 129* 103*  BUN 10 18 18   CREATININE 1.01 1.27* 1.09  CALCIUM 8.4* 8.4* 7.9*  MG  --   --  2.1    Terrilee Croak, MD  [facility] 07/17/2019

## 2019-07-18 LAB — CBC WITH DIFFERENTIAL/PLATELET
Abs Immature Granulocytes: 0.08 10*3/uL — ABNORMAL HIGH (ref 0.00–0.07)
Basophils Absolute: 0.1 10*3/uL (ref 0.0–0.1)
Basophils Relative: 1 %
Eosinophils Absolute: 0.7 10*3/uL — ABNORMAL HIGH (ref 0.0–0.5)
Eosinophils Relative: 7 %
HCT: 40.9 % (ref 39.0–52.0)
Hemoglobin: 13 g/dL (ref 13.0–17.0)
Immature Granulocytes: 1 %
Lymphocytes Relative: 14 %
Lymphs Abs: 1.3 10*3/uL (ref 0.7–4.0)
MCH: 31.6 pg (ref 26.0–34.0)
MCHC: 31.8 g/dL (ref 30.0–36.0)
MCV: 99.5 fL (ref 80.0–100.0)
Monocytes Absolute: 0.7 10*3/uL (ref 0.1–1.0)
Monocytes Relative: 7 %
Neutro Abs: 6.6 10*3/uL (ref 1.7–7.7)
Neutrophils Relative %: 70 %
Platelets: 284 10*3/uL (ref 150–400)
RBC: 4.11 MIL/uL — ABNORMAL LOW (ref 4.22–5.81)
RDW: 12.1 % (ref 11.5–15.5)
WBC: 9.4 10*3/uL (ref 4.0–10.5)
nRBC: 0 % (ref 0.0–0.2)

## 2019-07-18 LAB — BASIC METABOLIC PANEL
Anion gap: 7 (ref 5–15)
BUN: 13 mg/dL (ref 8–23)
CO2: 31 mmol/L (ref 22–32)
Calcium: 8 mg/dL — ABNORMAL LOW (ref 8.9–10.3)
Chloride: 101 mmol/L (ref 98–111)
Creatinine, Ser: 1.07 mg/dL (ref 0.61–1.24)
GFR calc Af Amer: >60 mL/min (ref >60)
GFR calc non Af Amer: >60 mL/min (ref >60)
Glucose, Bld: 85 mg/dL (ref 70–99)
Potassium: 3.6 mmol/L (ref 3.5–5.1)
Sodium: 139 mmol/L (ref 135–145)

## 2019-07-18 MED ORDER — ALPRAZOLAM 0.25 MG PO TABS
0.2500 mg | ORAL_TABLET | Freq: Once | ORAL | Status: AC
  Administered 2019-07-18: 0.25 mg via ORAL
  Filled 2019-07-18: qty 1

## 2019-07-18 NOTE — Progress Notes (Signed)
PROGRESS NOTE  [name]  DOB: Jun 08, 1932  PCP: Rutherford Guys, MD [id number]  DOA: 07/14/2019  LOS: 4 days   Brief narrative: Patient is an 85 year old male with past medical history of HTN, HLD, stroke, eczema and asthma. He was brought into the ED on 9/16 from home by his caregiver for worsening lower extremity edema along with diffuse eczema. Patient was taken to the urgent care where he was noted to be hypoxic and edematous and referred to the hospital for further evaluation.  In the ED, patient was hemodynamically stable.  Oxygen saturation was 76% on room air and was started on oxygen supplementation by nasal cannula. 3+ pedal edema on examination. SARS COVID-19 was negative.  CT chest showed bilateral groundglass opacities, bilateral pleural effusions.  Patient was admitted to the hospital with a working diagnosis of acute hypoxic respiratory failure due to pulmonary edema/bilateral pleural effusions likely diastolic heart failure/rule out community-acquired pneumonia. Blood culture obtained on admission ultimately showed MSSA.  Subjective: Patient was seen and examined this morning.  Pleasant elderly Caucasian male.  Lying down in bed.  Not in distress.  No new symptoms.  Assessment/Plan: Acute hypoxic respiratory failure Acute exacerbation of chronic diastolic congestive heart failure  -Presented with bilateral pedal edema, found to have pulmonary edema and bilateral pleural effusions -Initially diuresed with Lasix IV 40 mg twice daily.  Currently on Lasix 40 mg daily. -Echocardiogram showed 55 to 60% EF, no vegetation.  -Initially required oxygen supplementation.  Currently maintaining oxygen saturation on room air. -Continue metoprolol 50 mg daily.  MSSA bacteremia -in the setting of generalized erythematous rash/ pruriginous.  -Blood cultures obtained on admission resulted positive for MSSA bacteremia in 2 bottles. -ID consult appreciated.  Echocardiogram did  not show any vegetation.  Antibiotic narrowed to IV cefazolin.   Duration to be determined by ID.  Repeat blood culture obtained on  9/18 did not show any growth.  AKI -Creatinine elevated to 1.27 on 9/18 with IV Lasix.  Lasix stopped.  Creatinine improved to normal today.  Continue to monitor.  HTN -Continue metoprolol and Lasix for blood pressure control.   Hx of ischemic CVA -Continue with clopidogrel and pravastatin.  Incidental thyroid nodule. -5.7 cm left thyroid mass noted in CT chest.  -Ultrasonographyshowed asymmetric thyromegaly secondary to complex cystic lesion.  TSH normal.   Asthma. No clinical signs of exacerbation.  Continue PRN bronchodilators  Impaired mobility -PT eval obtained.  Recommended home health PT with 24-hour supervision when ready for discharge.  DVT prophylaxis:enoxaparin Code Status:DNR Family Communication:no family at the bedside Disposition Plan/ discharge barriers:pending clinical improvement.   Consultants:  Infectious disease  Procedures:    Antimicrobials: Anti-infectives (From admission, onward)   Start     Dose/Rate Route Frequency Ordered Stop   07/16/19 0000  vancomycin (VANCOCIN) IVPB 1000 mg/200 mL premix  Status:  Discontinued     1,000 mg 200 mL/hr over 60 Minutes Intravenous Every 24 hours 07/15/19 0547 07/15/19 0924   07/15/19 1700  ceFAZolin (ANCEF) IVPB 2g/100 mL premix     2 g 200 mL/hr over 30 Minutes Intravenous Every 8 hours 07/15/19 0928     07/15/19 1000  ceFEPIme (MAXIPIME) 2 g in sodium chloride 0.9 % 100 mL IVPB  Status:  Discontinued     2 g 200 mL/hr over 30 Minutes Intravenous Every 12 hours 07/15/19 0547 07/15/19 0924   07/14/19 2145  ceFEPIme (MAXIPIME) 2 g in sodium chloride 0.9 % 100 mL IVPB  2 g 200 mL/hr over 30 Minutes Intravenous  Once 07/14/19 2137 07/14/19 2341   07/14/19 2145  vancomycin (VANCOCIN) 1,250 mg in sodium chloride 0.9 % 250 mL IVPB     1,250 mg 166.7 mL/hr over 90  Minutes Intravenous  Once 07/14/19 2137 07/15/19 0134      Diet Order            Diet Heart Room service appropriate? Yes; Fluid consistency: Thin  Diet effective now              Infusions:  . sodium chloride    .  ceFAZolin (ANCEF) IV 2 g (07/18/19 1449)    Scheduled Meds: . budesonide  0.25 mg Inhalation BID  . clopidogrel  75 mg Oral Daily  . enoxaparin (LOVENOX) injection  40 mg Subcutaneous Q24H  . furosemide  40 mg Oral Daily  . metoprolol succinate  50 mg Oral Daily  . pravastatin  40 mg Oral q1800  . traZODone  50 mg Oral QHS    PRN meds: sodium chloride, albuterol, albuterol, docusate sodium, sodium chloride flush   Objective: Vitals:   07/17/19 2326 07/18/19 0741  BP: 112/67 127/80  Pulse: 82 83  Resp: 18 (!) 22  Temp: (!) 97.5 F (36.4 C) (!) 97.5 F (36.4 C)  SpO2: 98% 100%    Intake/Output Summary (Last 24 hours) at 07/18/2019 1506 Last data filed at 07/18/2019 1322 Gross per 24 hour  Intake 540 ml  Output 2500 ml  Net -1960 ml   Filed Weights   07/15/19 0321  Weight: 71.9 kg   Weight change:  Body mass index is 22.74 kg/m.   Physical Exam: General exam: Appears calm and comfortable.  Skin: No rashes, lesions or ulcers. HEENT: Atraumatic, normocephalic, supple neck, no obvious bleeding Lungs: Clear to auscultation bilaterally CVS: Regular rate and rhythm, mild systolic murmur GI/Abd soft, nontender, nondistended, bowel sound present CNS: Alert, awake, oriented x3 Psychiatry: Mood appropriate Extremities: Trace to 1+ bilateral pedal edema on both legs  Data Review: I have personally reviewed the laboratory data and studies available.  Recent Labs  Lab 07/14/19 1440 07/15/19 0427 07/16/19 0358 07/17/19 0803 07/18/19 0705  WBC 11.9* 9.4 18.8* 10.9* 9.4  NEUTROABS 8.9*  --  16.0* 7.5 6.6  HGB 14.9 12.7* 12.8* 12.9* 13.0  HCT 43.6 39.1 39.0 39.5 40.9  MCV 98.4 98.7 98.5 99.0 99.5  PLT 295 281 291 251 284   Recent Labs  Lab  07/14/19 1440 07/16/19 0358 07/17/19 0803 07/18/19 0705  NA 136 139 138 139  K 4.5 4.0 4.7 3.6  CL 102 99 99 101  CO2 24 30 28 31   GLUCOSE 100* 129* 103* 85  BUN 10 18 18 13   CREATININE 1.01 1.27* 1.09 1.07  CALCIUM 8.4* 8.4* 7.9* 8.0*  MG  --   --  2.1  --     Terrilee Croak, MD  [facility] 07/18/2019

## 2019-07-18 NOTE — Progress Notes (Signed)
Occupational Therapy Evaluation Patient Details Name: [name] MRN: [id number] DOB: 11-20-1931 Today's Date: 07/18/2019    History of Present Illness Patient is a 85 y/o male admitted with dyspnea, SpO2 70's in ED, and positive MSSA bacteremia.. Past medical history significant of seasonal allergies, basal cell carcinoma, hyperlipidemia, hypertension, osteoporosis and eczema.   Clinical Impression   PTA, pt lived at home with 24/7 caregivers. Pt states he has assistance for all mobility and ADL. Pt completed ADL and mobility @ RW level with SpO2 briefly desating into 80's, however poor pleth and quick rebound into 90s. Pt with increased WOB, however he states that is his baseline. Will follow acutely to facilitate safe DC home. Do not recommend follow up OT.     Follow Up Recommendations  No OT follow up;Supervision/Assistance - 24 hour    Equipment Recommendations  None recommended by OT    Recommendations for Other Services       Precautions / Restrictions Precautions Precautions: Fall Precaution Comments: monitor O2 Sats Restrictions Weight Bearing Restrictions: No      Mobility Bed Mobility Overal bed mobility: Needs Assistance Bed Mobility: Supine to Sit     Supine to sit: Min assist        Transfers Overall transfer level: Needs assistance Equipment used: Rolling walker (2 wheeled) Transfers: Sit to/from [facility] Sit to Stand: Min assist Stand pivot transfers: Min guard            Balance Overall balance assessment: Needs assistance   Sitting balance-Leahy Scale: Fair       Standing balance-Leahy Scale: Poor                             ADL either performed or assessed with clinical judgement   ADL Overall ADL's : Needs assistance/impaired     Grooming: Set up;Min guard;Standing   Upper Body Bathing: Set up;Supervision/ safety;Sitting   Lower Body Bathing: Moderate assistance;Sit to/from stand   Upper Body  Dressing : Moderate assistance;Sitting   Lower Body Dressing: Moderate assistance;Sit to/from stand   Toilet Transfer: Minimal assistance;RW;Ambulation     Toileting - Clothing Manipulation Details (indicate cue type and reason): foley     Functional mobility during ADLs: Rolling walker;Minimal assistance General ADL Comments: Min A to power up     Vision         Perception     Praxis      Pertinent Vitals/Pain Pain Assessment: No/denies pain     Hand Dominance Right   Extremity/Trunk Assessment Upper Extremity Assessment Upper Extremity Assessment: Generalized weakness   Lower Extremity Assessment Lower Extremity Assessment: Defer to PT evaluation   Cervical / Trunk Assessment Cervical / Trunk Assessment: Kyphotic   Communication Communication Communication: HOH   Cognition Arousal/Alertness: Awake/alert Behavior During Therapy: WFL for tasks assessed/performed Overall Cognitive Status: Within Functional Limits for tasks assessed                                     General Comments  Stae his caregivers won't let him stand up alone    Exercises     Shoulder Instructions      Home Living Family/patient expects to be discharged to:: Private residence Living Arrangements: Alone Available Help at Discharge: Available 24 hours/day;Personal care attendant Type of Home: House Home Access: Stairs to enter [employer] of Steps: 8 Entrance  Stairs-Rails: Right Home Layout: One level     Bathroom Shower/Tub: Teacher, early years/pre: Standard Bathroom Accessibility: No   Home Equipment: Tub bench;Cane - single point;Walker - 2 wheels;Bedside commode;Hand held shower head   Additional Comments: repotrts caregiver stays at the house      Prior Functioning/Environment Level of Independence: Needs assistance  Gait / Transfers Assistance Needed: uses RW and sometimes w/c ADL's / Homemaking Assistance Needed: caregiver  assist with dressing/bathing as needed, cooking,  dtr does grocery shopping Communication / Swallowing Assistance Needed: HOH          OT Problem List: Decreased strength;Decreased activity tolerance;Decreased knowledge of use of DME or AE;Cardiopulmonary status limiting activity      OT Treatment/Interventions: Self-care/ADL training;Therapeutic exercise;Energy conservation;DME and/or AE instruction;Therapeutic activities;Patient/family education;Balance training    OT Goals(Current goals can be found in the care plan section) Acute Rehab OT Goals Patient Stated Goal: To go home OT Goal Formulation: With patient Time For Goal Achievement: 08/01/19 Potential to Achieve Goals: Good  OT Frequency: Min 2X/week   Barriers to D/C:            Co-evaluation              AM-PAC OT "6 Clicks" Daily Activity     Outcome Measure Help from another person eating meals?: None Help from another person taking care of personal grooming?: A Little Help from another person toileting, which includes using toliet, bedpan, or urinal?: A Little Help from another person bathing (including washing, rinsing, drying)?: A Lot Help from another person to put on and taking off regular upper body clothing?: A Little Help from another person to put on and taking off regular lower body clothing?: A Lot 6 Click Score: 17   End of Session Equipment Utilized During Treatment: Gait belt;Rolling walker Nurse Communication: Mobility status  Activity Tolerance: Patient tolerated treatment well Patient left: in chair;with call bell/phone within reach;with chair alarm set  OT Visit Diagnosis: Unsteadiness on feet (R26.81);Muscle weakness (generalized) (M62.81)                Time: [id number] OT Time Calculation (min): 31 min Charges:  OT General Charges $OT Visit: 1 Visit OT Evaluation $OT Eval Moderate Complexity: 1 Mod OT Treatments $Self Care/Home Management : 8-22 mins  Maurie Boettcher, OT/L   Acute  OT Clinical Specialist [place] Pager [phone] Office [phone]   [facility] 07/18/2019, 5:23 PM

## 2019-07-19 LAB — CBC WITH DIFFERENTIAL/PLATELET
Abs Immature Granulocytes: 0.13 10*3/uL — ABNORMAL HIGH (ref 0.00–0.07)
Basophils Absolute: 0.1 10*3/uL (ref 0.0–0.1)
Basophils Relative: 1 %
Eosinophils Absolute: 0.8 10*3/uL — ABNORMAL HIGH (ref 0.0–0.5)
Eosinophils Relative: 8 %
HCT: 40.3 % (ref 39.0–52.0)
Hemoglobin: 13.6 g/dL (ref 13.0–17.0)
Immature Granulocytes: 1 %
Lymphocytes Relative: 15 %
Lymphs Abs: 1.5 10*3/uL (ref 0.7–4.0)
MCH: 32.6 pg (ref 26.0–34.0)
MCHC: 33.7 g/dL (ref 30.0–36.0)
MCV: 96.6 fL (ref 80.0–100.0)
Monocytes Absolute: 0.7 10*3/uL (ref 0.1–1.0)
Monocytes Relative: 7 %
Neutro Abs: 7 10*3/uL (ref 1.7–7.7)
Neutrophils Relative %: 68 %
Platelets: 247 10*3/uL (ref 150–400)
RBC: 4.17 MIL/uL — ABNORMAL LOW (ref 4.22–5.81)
RDW: 12 % (ref 11.5–15.5)
WBC: 10.2 10*3/uL (ref 4.0–10.5)
nRBC: 0 % (ref 0.0–0.2)

## 2019-07-19 LAB — BASIC METABOLIC PANEL
Anion gap: 9 (ref 5–15)
BUN: 14 mg/dL (ref 8–23)
CO2: 30 mmol/L (ref 22–32)
Calcium: 7.9 mg/dL — ABNORMAL LOW (ref 8.9–10.3)
Chloride: 98 mmol/L (ref 98–111)
Creatinine, Ser: 1.16 mg/dL (ref 0.61–1.24)
GFR calc Af Amer: >60 mL/min (ref >60)
GFR calc non Af Amer: 57 mL/min — ABNORMAL LOW (ref >60)
Glucose, Bld: 95 mg/dL (ref 70–99)
Potassium: 3.3 mmol/L — ABNORMAL LOW (ref 3.5–5.1)
Sodium: 137 mmol/L (ref 135–145)

## 2019-07-19 MED ORDER — POTASSIUM CHLORIDE CRYS ER 20 MEQ PO TBCR
40.0000 meq | EXTENDED_RELEASE_TABLET | Freq: Once | ORAL | Status: AC
  Administered 2019-07-19: 40 meq via ORAL
  Filled 2019-07-19: qty 2

## 2019-07-19 MED ORDER — DIPHENHYDRAMINE HCL 25 MG PO CAPS
25.0000 mg | ORAL_CAPSULE | Freq: Once | ORAL | Status: AC
  Administered 2019-07-19: 25 mg via ORAL
  Filled 2019-07-19: qty 1

## 2019-07-19 MED ORDER — SODIUM CHLORIDE 0.9 % IV SOLN
INTRAVENOUS | Status: DC
  Administered 2019-07-20 (×2): via INTRAVENOUS

## 2019-07-19 MED ORDER — SODIUM CHLORIDE 0.9% FLUSH
3.0000 mL | Freq: Two times a day (BID) | INTRAVENOUS | Status: DC
  Administered 2019-07-19 – 2019-07-21 (×4): 3 mL via INTRAVENOUS

## 2019-07-19 NOTE — Progress Notes (Signed)
[facility]  Date of Admission:  07/14/2019     Total days of antibiotics 6         ASSESSMENT:  [name] continues to receive treatment for MSSA bacteremia of unclear course with TTE showing small mobile density likely attributed to calcific degeneration and now scheduled for TEE on 9/22.  Repeat blood cultures have remained without growth.  He continues to feel well and has remained afebrile.  Continue current dose of cefazolin.  Final treatment recommendations pending results of TEE tomorrow.  PLAN:  1. Continue current dose of cefazolin. 2. Await TEE results.  Principal Problem:   MSSA bacteremia Active Problems:   Acute respiratory failure with hypoxia (HCC)   Eczema   Dyslipidemia   Leg edema   Thyroid mass   Hypoxia   Peripheral edema   Thyroid nodule   . clopidogrel  75 mg Oral Daily  . enoxaparin (LOVENOX) injection  40 mg Subcutaneous Q24H  . furosemide  40 mg Oral Daily  . metoprolol succinate  50 mg Oral Daily  . pravastatin  40 mg Oral q1800  . traZODone  50 mg Oral QHS    SUBJECTIVE:  Afebrile overnight with no acute events/concerns. Resting quietly   Allergies  Allergen Reactions  . Other Shortness Of Breath, Itching and Other (See Comments)    Seasonal allergies- Runny nose, congestion, itchy eyes, and wheezing     Review of Systems: Review of Systems  Constitutional: Negative for chills, fever and weight loss.  Respiratory: Negative for cough, shortness of breath and wheezing.   Cardiovascular: Negative for chest pain and leg swelling.  Gastrointestinal: Negative for abdominal pain, constipation, diarrhea, nausea and vomiting.  Skin: Negative for rash.      OBJECTIVE: Vitals:   07/18/19 0741 07/18/19 1608 07/18/19 2249 07/19/19 0747  BP: 127/80 101/71 126/73 116/72  Pulse: 83 85 88 83  Resp: (!) 22 20 20  (!) 30  Temp: (!) 97.5 F (36.4 C) 97.9 F (36.6 C) 97.8 F (36.6 C) 98.4 F (36.9 C)  TempSrc:  Oral Oral Oral Oral  SpO2: 100% 98% 100% 100%  Weight:      Height:       Body mass index is 22.74 kg/m.  Physical Exam Constitutional:      General: He is sleeping. He is not in acute distress.    Appearance: He is well-developed.     Comments: Lying in bed; sleeping; easily arousable.   Cardiovascular:     Rate and Rhythm: Normal rate and regular rhythm.     Heart sounds: Normal heart sounds.  Pulmonary:     Effort: Pulmonary effort is normal.     Breath sounds: Normal breath sounds.  Skin:    General: Skin is warm and dry.  Neurological:     Mental Status: He is oriented to person, place, and time and easily aroused.  Psychiatric:        Behavior: Behavior normal.        Thought Content: Thought content normal.        Judgment: Judgment normal.     Lab Results Lab Results  Component Value Date   WBC 10.2 07/19/2019   HGB 13.6 07/19/2019   HCT 40.3 07/19/2019   MCV 96.6 07/19/2019   PLT 247 07/19/2019    Lab Results  Component Value Date   CREATININE 1.16 07/19/2019   BUN 14 07/19/2019   NA 137 07/19/2019   K 3.3 (L) 07/19/2019  CL 98 07/19/2019   CO2 30 07/19/2019    Lab Results  Component Value Date   ALT 13 04/30/2019   AST 20 04/30/2019   ALKPHOS 76 04/30/2019   BILITOT 1.1 04/30/2019     Microbiology: Recent Results (from the past 240 hour(s))  Culture, blood (routine x 2)     Status: Abnormal   Collection Time: 07/14/19  2:18 PM   Specimen: BLOOD  Result Value Ref Range Status   Specimen Description BLOOD RIGHT ANTECUBITAL  Final   Special Requests   Final    BOTTLES DRAWN AEROBIC AND ANAEROBIC Blood Culture adequate volume   Culture  Setup Time   Final    GRAM POSITIVE COCCI IN BOTH AEROBIC AND ANAEROBIC BOTTLES CRITICAL RESULT CALLED TO, READ BACK BY AND VERIFIED WITH: PHRMD V BRYK @0545  07/15/19 BY S GEZAHEGN Performed at [facility], [place] [address] [place], [place] [address]    Culture STAPHYLOCOCCUS AUREUS (A)  Final    Report Status 07/17/2019 FINAL  Final   Organism ID, Bacteria STAPHYLOCOCCUS AUREUS  Final      Susceptibility   Staphylococcus aureus - MIC*    CIPROFLOXACIN >=8 RESISTANT Resistant     ERYTHROMYCIN 4 INTERMEDIATE Intermediate     GENTAMICIN <=0.5 SENSITIVE Sensitive     OXACILLIN <=0.25 SENSITIVE Sensitive     TETRACYCLINE <=1 SENSITIVE Sensitive     VANCOMYCIN 1 SENSITIVE Sensitive     TRIMETH/SULFA <=10 SENSITIVE Sensitive     CLINDAMYCIN <=0.25 SENSITIVE Sensitive     RIFAMPIN <=0.5 SENSITIVE Sensitive     Inducible Clindamycin NEGATIVE Sensitive     * STAPHYLOCOCCUS AUREUS  Blood Culture ID Panel (Reflexed)     Status: Abnormal   Collection Time: 07/14/19  2:18 PM  Result Value Ref Range Status   Enterococcus species NOT DETECTED NOT DETECTED Final   Listeria monocytogenes NOT DETECTED NOT DETECTED Final   Staphylococcus species DETECTED (A) NOT DETECTED Final    Comment: CRITICAL RESULT CALLED TO, READ BACK BY AND VERIFIED WITH: PHRMD V BRYK @0545  07/15/19 BY S GEZAHEGN    Staphylococcus aureus (BCID) DETECTED (A) NOT DETECTED Final    Comment: Methicillin (oxacillin) susceptible Staphylococcus aureus (MSSA). Preferred therapy is anti staphylococcal beta lactam antibiotic (Cefazolin or Nafcillin), unless clinically contraindicated. CRITICAL RESULT CALLED TO, READ BACK BY AND VERIFIED WITH: PHRMD V BRYK @0545  07/15/19 BY S GEZAHEGN    Methicillin resistance NOT DETECTED NOT DETECTED Final   Streptococcus species NOT DETECTED NOT DETECTED Final   Streptococcus agalactiae NOT DETECTED NOT DETECTED Final   Streptococcus pneumoniae NOT DETECTED NOT DETECTED Final   Streptococcus pyogenes NOT DETECTED NOT DETECTED Final   Acinetobacter baumannii NOT DETECTED NOT DETECTED Final   Enterobacteriaceae species NOT DETECTED NOT DETECTED Final   Enterobacter cloacae complex NOT DETECTED NOT DETECTED Final   Escherichia coli NOT DETECTED NOT DETECTED Final   Klebsiella oxytoca NOT  DETECTED NOT DETECTED Final   Klebsiella pneumoniae NOT DETECTED NOT DETECTED Final   Proteus species NOT DETECTED NOT DETECTED Final   Serratia marcescens NOT DETECTED NOT DETECTED Final   Haemophilus influenzae NOT DETECTED NOT DETECTED Final   Neisseria meningitidis NOT DETECTED NOT DETECTED Final   Pseudomonas aeruginosa NOT DETECTED NOT DETECTED Final   Candida albicans NOT DETECTED NOT DETECTED Final   Candida glabrata NOT DETECTED NOT DETECTED Final   Candida krusei NOT DETECTED NOT DETECTED Final   Candida parapsilosis NOT DETECTED NOT DETECTED Final   Candida tropicalis NOT  DETECTED NOT DETECTED Final    Comment: Performed at [facility], [place] [address] [place], [place] [address]  SARS CORONAVIRUS 2 (TAT 6-24 HRS) Nasopharyngeal Nasopharyngeal Swab     Status: None   Collection Time: 07/14/19  2:27 PM   Specimen: Nasopharyngeal Swab  Result Value Ref Range Status   SARS Coronavirus 2 NEGATIVE NEGATIVE Final    Comment: (NOTE) SARS-CoV-2 target nucleic acids are NOT DETECTED. The SARS-CoV-2 RNA is generally detectable in upper and lower respiratory specimens during the acute phase of infection. Negative results do not preclude SARS-CoV-2 infection, do not rule out co-infections with other pathogens, and should not be used as the sole basis for treatment or other patient management decisions. Negative results must be combined with clinical observations, patient history, and epidemiological information. The expected result is Negative. Fact Sheet for Patients: SugarRoll.be Fact Sheet for Healthcare Providers: https://www.woods-mathews.com/ This test is not yet approved or cleared by the [place] FDA and  has been authorized for detection and/or diagnosis of SARS-CoV-2 by FDA under an Emergency Use Authorization (EUA). This EUA will remain  in effect (meaning this test can be used) for the duration of the COVID-19  declaration under Section 56 4(b)(1) of the Act, 21 U.S.C. section 360bbb-3(b)(1), unless the authorization is terminated or revoked sooner. Performed at [facility], [place] [address], [place], [place] [address]   Culture, blood (routine x 2)     Status: Abnormal   Collection Time: 07/14/19  9:58 PM   Specimen: BLOOD RIGHT HAND  Result Value Ref Range Status   Specimen Description BLOOD RIGHT HAND  Final   Special Requests   Final    BOTTLES DRAWN AEROBIC AND ANAEROBIC Blood Culture adequate volume   Culture  Setup Time   Final    GRAM POSITIVE COCCI ANAEROBIC BOTTLE ONLY CRITICAL VALUE NOTED.  VALUE IS CONSISTENT WITH PREVIOUSLY REPORTED AND CALLED VALUE.    Culture (A)  Final    STAPHYLOCOCCUS AUREUS SUSCEPTIBILITIES PERFORMED ON PREVIOUS CULTURE WITHIN THE LAST 5 DAYS. Performed at [facility], [place] [address] [place], [place] [address]    Report Status 07/17/2019 FINAL  Final  Culture, blood (routine x 2)     Status: None (Preliminary result)   Collection Time: 07/16/19  3:57 AM   Specimen: BLOOD  Result Value Ref Range Status   Specimen Description BLOOD LEFT ANTECUBITAL  Final   Special Requests   Final    BOTTLES DRAWN AEROBIC AND ANAEROBIC Blood Culture adequate volume   Culture   Final    NO GROWTH 3 DAYS Performed at [facility], [place] [address] [place], [place] [address]    Report Status PENDING  Incomplete  Culture, blood (routine x 2)     Status: None (Preliminary result)   Collection Time: 07/16/19  3:58 AM   Specimen: BLOOD  Result Value Ref Range Status   Specimen Description BLOOD RIGHT ANTECUBITAL  Final   Special Requests   Final    BOTTLES DRAWN AEROBIC AND ANAEROBIC Blood Culture adequate volume   Culture   Final    NO GROWTH 3 DAYS Performed at [facility], [place] [address] [place], [place] [address]    Report Status PENDING  Incomplete     Terri Piedra, [place] [facility] [place] Pager  07/19/2019  12:02 PM

## 2019-07-19 NOTE — Plan of Care (Signed)
  Problem: Education: Goal: Knowledge of General Education information will improve Description: Including pain rating scale, medication(s)/side effects and non-pharmacologic comfort measures Outcome: Progressing   Problem: Health Behavior/Discharge Planning: Goal: Ability to manage health-related needs will improve Outcome: Progressing   Problem: Clinical Measurements: Goal: Ability to maintain clinical measurements within normal limits will improve Outcome: Progressing Goal: Will remain free from infection Outcome: Progressing Goal: Diagnostic test results will improve Outcome: Progressing Goal: Respiratory complications will improve Outcome: Progressing Goal: Cardiovascular complication will be avoided Outcome: Progressing   Problem: Activity: Goal: Risk for activity intolerance will decrease Outcome: Progressing   Problem: Nutrition: Goal: Adequate nutrition will be maintained Outcome: Progressing   Problem: Coping: Goal: Level of anxiety will decrease Outcome: Progressing   Problem: Pain Managment: Goal: General experience of comfort will improve Outcome: Progressing   Problem: Elimination: Goal: Will not experience complications related to bowel motility Outcome: Progressing Goal: Will not experience complications related to urinary retention Outcome: Progressing

## 2019-07-19 NOTE — Progress Notes (Signed)
Physical Therapy Treatment Patient Details Name: [name] MRN: [id number] DOB: January 01, 1932 Today's Date: 07/19/2019    History of Present Illness Patient is a 85 y/o male admitted with dyspnea, SpO2 70's in ED, and positive MSSA bacteremia.. Past medical history significant of seasonal allergies, basal cell carcinoma, hyperlipidemia, hypertension, osteoporosis and eczema.    PT Comments    Pt was agreeable to working on therapy.  Pt asked for time to do the activities, before assisting.  Sats maintained at 97% on RA during activity.  Emphasis on transitions to EOB, sit to stand and gait stability/stamina.    Follow Up Recommendations  Home health PT;Supervision/Assistance - 24 hour     Equipment Recommendations  None recommended by PT    Recommendations for Other Services       Precautions / Restrictions Precautions Precautions: Fall Precaution Comments: monitor O2 Sats    Mobility  Bed Mobility Overal bed mobility: Needs Assistance Bed Mobility: Supine to Sit     Supine to sit: Min assist;HOB elevated;Min guard     General bed mobility comments: increased time, mostly guard assist  Transfers Overall transfer level: Needs assistance Equipment used: Rolling walker (2 wheeled) Transfers: Sit to/from Stand Sit to Stand: Min assist         General transfer comment: cues for hand placement, minimal assist to both boost and come forward.  Ambulation/Gait Ambulation/Gait assistance: Min assist Gait Distance (Feet): 60 Feet(then 180 after standing break to take SpO2) Assistive device: Rolling walker (2 wheeled) Gait Pattern/deviations: Step-through pattern;Decreased stride length Gait velocity: slower   General Gait Details: mildly unsteady at best, worsening mildly with fatigue.  Walking behind the RW at times, cues for posture and more assist with fatigue.  Sats maintained at 97% at different check points, EHR 80's nad 90's, but with increased dyspnea and  fatigue.   Stairs             Wheelchair Mobility    Modified Rankin (Stroke Patients Only)       Balance Overall balance assessment: Needs assistance   Sitting balance-Leahy Scale: Fair       Standing balance-Leahy Scale: Poor Standing balance comment: UE support for balance                            Cognition Arousal/Alertness: Awake/alert Behavior During Therapy: WFL for tasks assessed/performed Overall Cognitive Status: Within Functional Limits for tasks assessed                                        Exercises      General Comments        Pertinent Vitals/Pain Pain Assessment: No/denies pain    Home Living                      Prior Function            PT Goals (current goals can now be found in the care plan section) Acute Rehab PT Goals Patient Stated Goal: To go home PT Goal Formulation: With patient Time For Goal Achievement: 07/30/19 Potential to Achieve Goals: Good Progress towards PT goals: Progressing toward goals    Frequency    Min 3X/week      PT Plan Current plan remains appropriate    Co-evaluation  AM-PAC PT "6 Clicks" Mobility   Outcome Measure  Help needed turning from your back to your side while in a flat bed without using bedrails?: None Help needed moving from lying on your back to sitting on the side of a flat bed without using bedrails?: None Help needed moving to and from a bed to a chair (including a wheelchair)?: A Little Help needed standing up from a chair using your arms (e.g., wheelchair or bedside chair)?: A Little Help needed to walk in hospital room?: A Little Help needed climbing 3-5 steps with a railing? : A Little 6 Click Score: 20    End of Session   Activity Tolerance: Patient tolerated treatment well Patient left: in chair;with chair alarm set   PT Visit Diagnosis: Other abnormalities of gait and mobility (R26.89);Muscle weakness  (generalized) (M62.81);History of falling (Z91.81)     Time: [id number] PT Time Calculation (min) (ACUTE ONLY): 25 min  Charges:  $Gait Training: 8-22 mins $Therapeutic Activity: 8-22 mins                     07/19/2019  Donnella Sham, PT Acute Rehabilitation Services [phone]  (pager) [phone]  (office)   Tessie Fass Eliezer Khawaja 07/19/2019, 2:42 PM

## 2019-07-19 NOTE — H&P (View-Only) (Signed)
[name]  Date of Admission:  07/14/2019     Total days of antibiotics 6         ASSESSMENT:  [name] continues to receive treatment for MSSA bacteremia of unclear course with TTE showing small mobile density likely attributed to calcific degeneration and now scheduled for TEE on 9/22.  Repeat blood cultures have remained without growth.  He continues to feel well and has remained afebrile.  Continue current dose of cefazolin.  Final treatment recommendations pending results of TEE tomorrow.  PLAN:  1. Continue current dose of cefazolin. 2. Await TEE results.  Principal Problem:   MSSA bacteremia Active Problems:   Acute respiratory failure with hypoxia (HCC)   Eczema   Dyslipidemia   Leg edema   Thyroid mass   Hypoxia   Peripheral edema   Thyroid nodule   . clopidogrel  75 mg Oral Daily  . enoxaparin (LOVENOX) injection  40 mg Subcutaneous Q24H  . furosemide  40 mg Oral Daily  . metoprolol succinate  50 mg Oral Daily  . pravastatin  40 mg Oral q1800  . traZODone  50 mg Oral QHS    SUBJECTIVE:  Afebrile overnight with no acute events/concerns. Resting quietly   Allergies  Allergen Reactions  . Other Shortness Of Breath, Itching and Other (See Comments)    Seasonal allergies- Runny nose, congestion, itchy eyes, and wheezing     Review of Systems: Review of Systems  Constitutional: Negative for chills, fever and weight loss.  Respiratory: Negative for cough, shortness of breath and wheezing.   Cardiovascular: Negative for chest pain and leg swelling.  Gastrointestinal: Negative for abdominal pain, constipation, diarrhea, nausea and vomiting.  Skin: Negative for rash.      OBJECTIVE: Vitals:   07/18/19 0741 07/18/19 1608 07/18/19 2249 07/19/19 0747  BP: 127/80 101/71 126/73 116/72  Pulse: 83 85 88 83  Resp: (!) 22 20 20  (!) 30  Temp: (!) 97.5 F (36.4 C) 97.9 F (36.6 C) 97.8 F (36.6 C) 98.4 F (36.9 C)  TempSrc:  Oral Oral Oral Oral  SpO2: 100% 98% 100% 100%  Weight:      Height:       Body mass index is 22.74 kg/m.  Physical Exam Constitutional:      General: He is sleeping. He is not in acute distress.    Appearance: He is well-developed.     Comments: Lying in bed; sleeping; easily arousable.   Cardiovascular:     Rate and Rhythm: Normal rate and regular rhythm.     Heart sounds: Normal heart sounds.  Pulmonary:     Effort: Pulmonary effort is normal.     Breath sounds: Normal breath sounds.  Skin:    General: Skin is warm and dry.  Neurological:     Mental Status: He is oriented to person, place, and time and easily aroused.  Psychiatric:        Behavior: Behavior normal.        Thought Content: Thought content normal.        Judgment: Judgment normal.     Lab Results Lab Results  Component Value Date   WBC 10.2 07/19/2019   HGB 13.6 07/19/2019   HCT 40.3 07/19/2019   MCV 96.6 07/19/2019   PLT 247 07/19/2019    Lab Results  Component Value Date   CREATININE 1.16 07/19/2019   BUN 14 07/19/2019   NA 137 07/19/2019   K 3.3 (L) 07/19/2019  CL 98 07/19/2019   CO2 30 07/19/2019    Lab Results  Component Value Date   ALT 13 04/30/2019   AST 20 04/30/2019   ALKPHOS 76 04/30/2019   BILITOT 1.1 04/30/2019     Microbiology: Recent Results (from the past 240 hour(s))  Culture, blood (routine x 2)     Status: Abnormal   Collection Time: 07/14/19  2:18 PM   Specimen: BLOOD  Result Value Ref Range Status   Specimen Description BLOOD RIGHT ANTECUBITAL  Final   Special Requests   Final    BOTTLES DRAWN AEROBIC AND ANAEROBIC Blood Culture adequate volume   Culture  Setup Time   Final    GRAM POSITIVE COCCI IN BOTH AEROBIC AND ANAEROBIC BOTTLES CRITICAL RESULT CALLED TO, READ BACK BY AND VERIFIED WITH: PHRMD V BRYK @0545  07/15/19 BY S GEZAHEGN Performed at [facility], [place] [address] [place],  [address]    Culture STAPHYLOCOCCUS AUREUS (A)  Final    Report Status 07/17/2019 FINAL  Final   Organism ID, Bacteria STAPHYLOCOCCUS AUREUS  Final      Susceptibility   Staphylococcus aureus - MIC*    CIPROFLOXACIN >=8 RESISTANT Resistant     ERYTHROMYCIN 4 INTERMEDIATE Intermediate     GENTAMICIN <=0.5 SENSITIVE Sensitive     OXACILLIN <=0.25 SENSITIVE Sensitive     TETRACYCLINE <=1 SENSITIVE Sensitive     VANCOMYCIN 1 SENSITIVE Sensitive     TRIMETH/SULFA <=10 SENSITIVE Sensitive     CLINDAMYCIN <=0.25 SENSITIVE Sensitive     RIFAMPIN <=0.5 SENSITIVE Sensitive     Inducible Clindamycin NEGATIVE Sensitive     * STAPHYLOCOCCUS AUREUS  Blood Culture ID Panel (Reflexed)     Status: Abnormal   Collection Time: 07/14/19  2:18 PM  Result Value Ref Range Status   Enterococcus species NOT DETECTED NOT DETECTED Final   Listeria monocytogenes NOT DETECTED NOT DETECTED Final   Staphylococcus species DETECTED (A) NOT DETECTED Final    Comment: CRITICAL RESULT CALLED TO, READ BACK BY AND VERIFIED WITH: PHRMD V BRYK @0545  07/15/19 BY S GEZAHEGN    Staphylococcus aureus (BCID) DETECTED (A) NOT DETECTED Final    Comment: Methicillin (oxacillin) susceptible Staphylococcus aureus (MSSA). Preferred therapy is anti staphylococcal beta lactam antibiotic (Cefazolin or Nafcillin), unless clinically contraindicated. CRITICAL RESULT CALLED TO, READ BACK BY AND VERIFIED WITH: PHRMD V BRYK @0545  07/15/19 BY S GEZAHEGN    Methicillin resistance NOT DETECTED NOT DETECTED Final   Streptococcus species NOT DETECTED NOT DETECTED Final   Streptococcus agalactiae NOT DETECTED NOT DETECTED Final   Streptococcus pneumoniae NOT DETECTED NOT DETECTED Final   Streptococcus pyogenes NOT DETECTED NOT DETECTED Final   Acinetobacter baumannii NOT DETECTED NOT DETECTED Final   Enterobacteriaceae species NOT DETECTED NOT DETECTED Final   Enterobacter cloacae complex NOT DETECTED NOT DETECTED Final   Escherichia coli NOT DETECTED NOT DETECTED Final   Klebsiella oxytoca NOT  DETECTED NOT DETECTED Final   Klebsiella pneumoniae NOT DETECTED NOT DETECTED Final   Proteus species NOT DETECTED NOT DETECTED Final   Serratia marcescens NOT DETECTED NOT DETECTED Final   Haemophilus influenzae NOT DETECTED NOT DETECTED Final   Neisseria meningitidis NOT DETECTED NOT DETECTED Final   Pseudomonas aeruginosa NOT DETECTED NOT DETECTED Final   Candida albicans NOT DETECTED NOT DETECTED Final   Candida glabrata NOT DETECTED NOT DETECTED Final   Candida krusei NOT DETECTED NOT DETECTED Final   Candida parapsilosis NOT DETECTED NOT DETECTED Final   Candida tropicalis NOT  DETECTED NOT DETECTED Final    Comment: Performed at [facility], [place] [address], [place], [place] [address]  SARS CORONAVIRUS 2 (TAT 6-24 HRS) Nasopharyngeal Nasopharyngeal Swab     Status: None   Collection Time: 07/14/19  2:27 PM   Specimen: Nasopharyngeal Swab  Result Value Ref Range Status   SARS Coronavirus 2 NEGATIVE NEGATIVE Final    Comment: (NOTE) SARS-CoV-2 target nucleic acids are NOT DETECTED. The SARS-CoV-2 RNA is generally detectable in upper and lower respiratory specimens during the acute phase of infection. Negative results do not preclude SARS-CoV-2 infection, do not rule out co-infections with other pathogens, and should not be used as the sole basis for treatment or other patient management decisions. Negative results must be combined with clinical observations, patient history, and epidemiological information. The expected result is Negative. Fact Sheet for Patients: SugarRoll.be Fact Sheet for Healthcare Providers: https://www.woods-mathews.com/ This test is not yet approved or cleared by the [place] FDA and  has been authorized for detection and/or diagnosis of SARS-CoV-2 by FDA under an Emergency Use Authorization (EUA). This EUA will remain  in effect (meaning this test can be used) for the duration of the COVID-19  declaration under Section 56 4(b)(1) of the Act, 21 U.S.C. section 360bbb-3(b)(1), unless the authorization is terminated or revoked sooner. Performed at [facility], [place] [address] [place], [place] [address]   Culture, blood (routine x 2)     Status: Abnormal   Collection Time: 07/14/19  9:58 PM   Specimen: BLOOD RIGHT HAND  Result Value Ref Range Status   Specimen Description BLOOD RIGHT HAND  Final   Special Requests   Final    BOTTLES DRAWN AEROBIC AND ANAEROBIC Blood Culture adequate volume   Culture  Setup Time   Final    GRAM POSITIVE COCCI ANAEROBIC BOTTLE ONLY CRITICAL VALUE NOTED.  VALUE IS CONSISTENT WITH PREVIOUSLY REPORTED AND CALLED VALUE.    Culture (A)  Final    STAPHYLOCOCCUS AUREUS SUSCEPTIBILITIES PERFORMED ON PREVIOUS CULTURE WITHIN THE LAST 5 DAYS. Performed at [facility], [place] [address] [place], [place] [address]    Report Status 07/17/2019 FINAL  Final  Culture, blood (routine x 2)     Status: None (Preliminary result)   Collection Time: 07/16/19  3:57 AM   Specimen: BLOOD  Result Value Ref Range Status   Specimen Description BLOOD LEFT ANTECUBITAL  Final   Special Requests   Final    BOTTLES DRAWN AEROBIC AND ANAEROBIC Blood Culture adequate volume   Culture   Final    NO GROWTH 3 DAYS Performed at [facility], [place] [address], [place], [place] [address]    Report Status PENDING  Incomplete  Culture, blood (routine x 2)     Status: None (Preliminary result)   Collection Time: 07/16/19  3:58 AM   Specimen: BLOOD  Result Value Ref Range Status   Specimen Description BLOOD RIGHT ANTECUBITAL  Final   Special Requests   Final    BOTTLES DRAWN AEROBIC AND ANAEROBIC Blood Culture adequate volume   Culture   Final    NO GROWTH 3 DAYS Performed at [facility], [place] [address], [place], [place] [address]    Report Status PENDING  Incomplete     Terri Piedra, [place] for [place] Pager  07/19/2019  12:02 PM

## 2019-07-19 NOTE — TOC Initial Note (Signed)
Transition of Care [facility]) - Initial/Assessment Note    Patient Details  Name: [name] MRN: [id number] Date of Birth: 06/15/1932  [facility] CM/SW Contact:    Maryclare Labrador, RN Phone Number: 07/19/2019, 1:05 PM  Clinical Narrative:     PTA from home with 24/7 paid caregiver in the home.  CM was unable to reach pt via phone (CM working remote). Pt will likely discharge home with IV antibiotics - attending to discuss with daughter.   Pt's daughter informed CM that pt will continue to have caregiver in the home;  Francetta Found [phone].  Caregiver will be the one to assist pt in the home with the IV antibiotics and will be available for teaching.  Daughter informed CM that they would like to use [facility] for [facility] and [facility] for IV infusion.  CM left VM for Pam with [facility] regarding potential referral.   Daughter informed CM that she or the caregiver transports pts to appts, daughter denied barriers with food or shelter or concerns with pt returning home at discharge.  CM will inform [facility] of pending referral for [facility].  Pt is on home oxygen at night 2 L - supplied by [employer].             Expected Discharge Plan: [place] Barriers to Discharge: Continued Medical Work up   Patient Goals and CMS Choice   CMS Medicare.gov Compare Post Acute Care list provided to:: Other (Comment Required)(daughter on behalf of pt) Choice offered to / list presented to : Adult Children  Expected Discharge Plan and Services Expected Discharge Plan: [place] Choice: Durable Medical Equipment, Home Health Living arrangements for the past 2 months: [place]                                      Prior Living Arrangements/Services Living arrangements for the past 2 months: Single Family Home Lives with:: (Pt has a 24/7 paid  caregiver in the home [place]) Patient language and need for interpreter  reviewed:: Yes        Need for Family Participation in Patient Care: Yes (Comment) Care giver support system in place?: Yes (comment) Current home services: Homehealth aide, Housekeeping Criminal Activity/Legal Involvement Pertinent to Current Situation/Hospitalization: No - Comment as needed  Activities of Daily Living Home Assistive Devices/Equipment: Environmental consultant (specify type), Wheelchair ADL Screening (condition at time of admission) Patient's cognitive ability adequate to safely complete daily activities?: Yes Is the patient deaf or have difficulty hearing?: No Does the patient have difficulty seeing, even when wearing glasses/contacts?: No Does the patient have difficulty concentrating, remembering, or making decisions?: No Patient able to express need for assistance with ADLs?: Yes Does the patient have difficulty dressing or bathing?: Yes Independently performs ADLs?: No Communication: Needs assistance Is this a change from baseline?: Pre-admission baseline Dressing (OT): Needs assistance Is this a change from baseline?: Pre-admission baseline Grooming: Needs assistance Is this a change from baseline?: Pre-admission baseline Feeding: Independent Bathing: Needs assistance Is this a change from baseline?: Pre-admission baseline Toileting: Needs assistance Is this a change from baseline?: Pre-admission baseline In/Out Bed: Needs assistance Is this a change from baseline?: Pre-admission baseline Walks in Home: Needs assistance Is this a change from baseline?: Pre-admission baseline Does the patient have difficulty walking or climbing stairs?: Yes Weakness of  Legs: Both Weakness of Arms/Hands: None  Permission Sought/Granted                  Emotional Assessment              Admission diagnosis:  Thyroid nodule [E04.1] Peripheral edema [R60.9] Hypoxia [R09.02] Acute respiratory failure with hypoxia ([place]) [J96.01] Patient Active Problem List   Diagnosis Date  Noted  . MSSA bacteremia 07/15/2019  . Hypoxia   . Peripheral edema   . Thyroid nodule   . Acute respiratory failure with hypoxia ([place]) 07/14/2019  . HCAP (healthcare-associated pneumonia) 07/14/2019  . Thyroid mass 07/14/2019  . Diaphragm paralysis 05/04/2019  . Restrictive lung disease 05/04/2019  . Altered mental status 04/30/2019  . Confusion 04/30/2019  . Abnormal weight loss 03/23/2019  . Pain in joint of left shoulder 03/23/2019  . Frequent urination 03/23/2019  . Constipation 03/23/2019  . TIA (transient ischemic attack) 03/08/2019  . Hyponatremia 03/08/2019  . Impaired mobility 01/13/2019  . Seasonal allergies 01/13/2019  . Hiatal hernia 01/13/2019  . Pain in right hip 12/24/2018  . S/P right hip fracture 11/11/2018  . Recurrent falls 05/12/2018  . History of CVA (cerebrovascular accident) 01/16/2018  . Nocturnal enuresis   . Fall   . Full incontinence of feces   . Essential hypertension   . Hypoalbuminemia due to protein-calorie malnutrition ([place])   . Abnormality of gait   . Leg edema   . Hip fracture ([place]) 03/30/2017  . Leukocytosis 03/29/2017  . Mild intermittent asthma with acute exacerbation 01/29/2017  . Chronic renal insufficiency 09/22/2015  . Tachycardia 09/22/2015  . PVC (premature ventricular contraction) 09/22/2015  . Osteoporosis 06/03/2014  . Basal cell carcinoma of face 06/03/2014  . Benign prostatic hyperplasia 05/27/2013  . Eczema 04/23/2012  . Dyslipidemia 04/23/2012   PCP:  Rutherford Guys, MD Pharmacy:   [facility], [place] [address] Phone: [phone] Fax: [place], [place] - [address] [place] [address] Phone: [phone] Fax: [phone]     Social Determinants of Health (SDOH) Interventions    Readmission Risk Interventions No flowsheet data found.

## 2019-07-19 NOTE — Progress Notes (Signed)
PROGRESS NOTE  [name]  DOB: 04-02-1932  PCP: Rutherford Guys, MD [id number]  DOA: 07/14/2019  LOS: 5 days   Brief narrative: Patient is an 85 year old male with past medical history of HTN, HLD, stroke, eczema and asthma. He was brought into the ED on 9/16 from home by his caregiver for worsening lower extremity edema along with diffuse eczema. Patient was taken to the urgent care where he was noted to be hypoxic and edematous and referred to the hospital for further evaluation.  In the ED, patient was hemodynamically stable.  Oxygen saturation was 76% on room air and was started on oxygen supplementation by nasal cannula. 3+ pedal edema on examination. SARS COVID-19 was negative.  CT chest showed bilateral groundglass opacities, bilateral pleural effusions.  Patient was admitted to the hospital with a working diagnosis of acute hypoxic respiratory failure due to pulmonary edema/bilateral pleural effusions likely diastolic heart failure/rule out community-acquired pneumonia. Blood culture obtained on admission ultimately showed MSSA. Pending TEE.  Subjective: Patient was seen and examined this morning.  Pleasant elderly Caucasian male.  Lying down in bed.  Not in distress.  No new symptoms.  Assessment/Plan: Acute hypoxic respiratory failure Acute exacerbation of chronic diastolic congestive heart failure  -Presented with bilateral pedal edema, found to have pulmonary edema and bilateral pleural effusions -Initially diuresed with Lasix IV 40 mg twice daily.  Currently on Lasix 40 mg oral daily. -Initially required oxygen supplementation.  Currently maintaining oxygen saturation on room air. -Continue metoprolol 50 mg daily. -Echocardiogram showed 55 to 60% EF, no vegetation.   MSSA bacteremia -in the setting of generalized erythematous rash/ pruriginous.  -Blood cultures obtained on admission resulted positive for MSSA bacteremia in 2 bottles. -ID consult appreciated.   Echocardiogram did not show any vegetation.  -Pending TEE.  Reminded cardiology service today again.  I will order NPO after midnight. -Antibiotic narrowed to IV cefazolin. Duration to be determined by ID.  Repeat blood culture obtained on  9/18 did not show any growth.  AKI -Creatinine peaked at 1.27 on 9/18. Creatinine improved to normal today.  On oral Lasix now.  HTN -Continue metoprolol and Lasix for blood pressure control.   Hx of ischemic CVA -Continue with clopidogrel and pravastatin.  Incidental thyroid nodule. -5.7 cm left thyroid mass noted in CT chest.  -Ultrasonographyshowed asymmetric thyromegaly secondary to complex cystic lesion.  TSH normal.   Asthma. No clinical signs of exacerbation.  Continue PRN bronchodilators  Impaired mobility -PT eval obtained.  Recommended home health PT with 24-hour supervision when ready for discharge.  DVT prophylaxis:enoxaparin Code Status:DNR Family Communication:no family at the bedside Disposition Plan/ discharge barriers:pending TEE tomorrow.  Patient will need a PICC line ordered if TEE negative.   Consultants:  Infectious disease  Procedures:    Antimicrobials: Anti-infectives (From admission, onward)   Start     Dose/Rate Route Frequency Ordered Stop   07/16/19 0000  vancomycin (VANCOCIN) IVPB 1000 mg/200 mL premix  Status:  Discontinued     1,000 mg 200 mL/hr over 60 Minutes Intravenous Every 24 hours 07/15/19 0547 07/15/19 0924   07/15/19 1700  ceFAZolin (ANCEF) IVPB 2g/100 mL premix     2 g 200 mL/hr over 30 Minutes Intravenous Every 8 hours 07/15/19 0928     07/15/19 1000  ceFEPIme (MAXIPIME) 2 g in sodium chloride 0.9 % 100 mL IVPB  Status:  Discontinued     2 g 200 mL/hr over 30 Minutes Intravenous Every 12 hours 07/15/19 0547 07/15/19  [id number]   07/14/19 2145  ceFEPIme (MAXIPIME) 2 g in sodium chloride 0.9 % 100 mL IVPB     2 g 200 mL/hr over 30 Minutes Intravenous  Once 07/14/19 2137 07/14/19  2341   07/14/19 2145  vancomycin (VANCOCIN) 1,250 mg in sodium chloride 0.9 % 250 mL IVPB     1,250 mg 166.7 mL/hr over 90 Minutes Intravenous  Once 07/14/19 2137 07/15/19 0134      Diet Order            Diet NPO time specified  Diet effective midnight        Diet Heart Room service appropriate? Yes; Fluid consistency: Thin  Diet effective now              Infusions:  . sodium chloride    .  ceFAZolin (ANCEF) IV 2 g (07/19/19 0528)    Scheduled Meds: . clopidogrel  75 mg Oral Daily  . enoxaparin (LOVENOX) injection  40 mg Subcutaneous Q24H  . furosemide  40 mg Oral Daily  . metoprolol succinate  50 mg Oral Daily  . pravastatin  40 mg Oral q1800  . traZODone  50 mg Oral QHS    PRN meds: sodium chloride, albuterol, albuterol, docusate sodium, sodium chloride flush   Objective: Vitals:   07/18/19 2249 07/19/19 0747  BP: 126/73 116/72  Pulse: 88 83  Resp: 20 (!) 30  Temp: 97.8 F (36.6 C) 98.4 F (36.9 C)  SpO2: 100% 100%    Intake/Output Summary (Last 24 hours) at 07/19/2019 1108 Last data filed at 07/19/2019 1000 Gross per 24 hour  Intake 720 ml  Output 1650 ml  Net -930 ml   Filed Weights   07/15/19 0321  Weight: 71.9 kg   Weight change:  Body mass index is 22.74 kg/m.   Physical Exam: General exam: Appears calm and comfortable.  Skin: No rashes, lesions or ulcers. HEENT: Atraumatic, normocephalic, supple neck, no obvious bleeding Lungs: Clear to auscultation bilaterally CVS: Regular rate and rhythm, mild systolic murmur GI/Abd soft, nontender, nondistended, bowel sound present CNS: Alert, awake, oriented x3 Psychiatry: Mood appropriate Extremities: Trace to 1+ bilateral pedal edema on both legs  Data Review: I have personally reviewed the laboratory data and studies available.  Recent Labs  Lab 07/14/19 1440 07/15/19 0427 07/16/19 0358 07/17/19 0803 07/18/19 0705 07/19/19 0617  WBC 11.9* 9.4 18.8* 10.9* 9.4 10.2  NEUTROABS 8.9*  --   16.0* 7.5 6.6 7.0  HGB 14.9 12.7* 12.8* 12.9* 13.0 13.6  HCT 43.6 39.1 39.0 39.5 40.9 40.3  MCV 98.4 98.7 98.5 99.0 99.5 96.6  PLT 295 281 291 251 284 247   Recent Labs  Lab 07/14/19 1440 07/16/19 0358 07/17/19 0803 07/18/19 0705 07/19/19 0617  NA 136 139 138 139 137  K 4.5 4.0 4.7 3.6 3.3*  CL 102 99 99 101 98  CO2 24 30 28 31 30   GLUCOSE 100* 129* 103* 85 95  BUN 10 18 18 13 14   CREATININE 1.01 1.27* 1.09 1.07 1.16  CALCIUM 8.4* 8.4* 7.9* 8.0* 7.9*  MG  --   --  2.1  --   --     Terrilee Croak, MD  [facility] 07/19/2019

## 2019-07-19 NOTE — Progress Notes (Signed)
    [facility] has been requested to perform a transesophageal echocardiogram on [name] for bacteremia.  After careful review of history and examination, the risks and benefits of transesophageal echocardiogram have been explained including risks of esophageal damage, perforation (1:10,000 risk), bleeding, pharyngeal hematoma as well as other potential complications associated with conscious sedation including aspiration, arrhythmia, respiratory failure and death. Alternatives to treatment were discussed, questions were answered. Patient is willing to proceed.   Lyda Jester, PA-C  07/19/2019 5:23 PM

## 2019-07-20 ENCOUNTER — Inpatient Hospital Stay (HOSPITAL_COMMUNITY): Payer: Medicare Other | Admitting: Certified Registered Nurse Anesthetist

## 2019-07-20 ENCOUNTER — Inpatient Hospital Stay (HOSPITAL_COMMUNITY): Payer: Medicare Other

## 2019-07-20 ENCOUNTER — Encounter (HOSPITAL_COMMUNITY)
Admission: EM | Disposition: A | Discharge status: Home / Self Care | Payer: Self-pay | Source: Home / Self Care | Attending: Internal Medicine

## 2019-07-20 ENCOUNTER — Inpatient Hospital Stay: Payer: Self-pay

## 2019-07-20 ENCOUNTER — Encounter (HOSPITAL_COMMUNITY): Payer: Self-pay | Admitting: Certified Registered Nurse Anesthetist

## 2019-07-20 DIAGNOSIS — R7881 Bacteremia: Secondary | ICD-10-CM

## 2019-07-20 DIAGNOSIS — I340 Nonrheumatic mitral (valve) insufficiency: Secondary | ICD-10-CM

## 2019-07-20 DIAGNOSIS — R609 Edema, unspecified: Secondary | ICD-10-CM

## 2019-07-20 DIAGNOSIS — I351 Nonrheumatic aortic (valve) insufficiency: Secondary | ICD-10-CM

## 2019-07-20 HISTORY — PX: TEE WITHOUT CARDIOVERSION: SHX5443

## 2019-07-20 LAB — CBC WITH DIFFERENTIAL/PLATELET
Abs Immature Granulocytes: 0.13 10*3/uL — ABNORMAL HIGH (ref 0.00–0.07)
Basophils Absolute: 0.1 10*3/uL (ref 0.0–0.1)
Basophils Relative: 0 %
Eosinophils Absolute: 1 10*3/uL — ABNORMAL HIGH (ref 0.0–0.5)
Eosinophils Relative: 9 %
HCT: 39.3 % (ref 39.0–52.0)
Hemoglobin: 13 g/dL (ref 13.0–17.0)
Immature Granulocytes: 1 %
Lymphocytes Relative: 12 %
Lymphs Abs: 1.4 10*3/uL (ref 0.7–4.0)
MCH: 31.9 pg (ref 26.0–34.0)
MCHC: 33.1 g/dL (ref 30.0–36.0)
MCV: 96.3 fL (ref 80.0–100.0)
Monocytes Absolute: 0.8 10*3/uL (ref 0.1–1.0)
Monocytes Relative: 7 %
Neutro Abs: 8 10*3/uL — ABNORMAL HIGH (ref 1.7–7.7)
Neutrophils Relative %: 71 %
Platelets: 266 10*3/uL (ref 150–400)
RBC: 4.08 MIL/uL — ABNORMAL LOW (ref 4.22–5.81)
RDW: 12 % (ref 11.5–15.5)
WBC: 11.4 10*3/uL — ABNORMAL HIGH (ref 4.0–10.5)
nRBC: 0 % (ref 0.0–0.2)

## 2019-07-20 LAB — BASIC METABOLIC PANEL
Anion gap: 9 (ref 5–15)
BUN: 15 mg/dL (ref 8–23)
CO2: 28 mmol/L (ref 22–32)
Calcium: 8.1 mg/dL — ABNORMAL LOW (ref 8.9–10.3)
Chloride: 100 mmol/L (ref 98–111)
Creatinine, Ser: 1.19 mg/dL (ref 0.61–1.24)
GFR calc Af Amer: >60 mL/min (ref >60)
GFR calc non Af Amer: 55 mL/min — ABNORMAL LOW (ref >60)
Glucose, Bld: 104 mg/dL — ABNORMAL HIGH (ref 70–99)
Potassium: 3.7 mmol/L (ref 3.5–5.1)
Sodium: 137 mmol/L (ref 135–145)

## 2019-07-20 SURGERY — ECHOCARDIOGRAM, TRANSESOPHAGEAL
Anesthesia: Monitor Anesthesia Care

## 2019-07-20 MED ORDER — PROPOFOL 500 MG/50ML IV EMUL
INTRAVENOUS | Status: DC | PRN
  Administered 2019-07-20: 50 ug/kg/min via INTRAVENOUS

## 2019-07-20 MED ORDER — LINEZOLID 600 MG PO TABS: 600.0000 mg | ORAL_TABLET | Freq: Two times a day (BID) | ORAL | 0 refills | Status: AC

## 2019-07-20 MED ORDER — LINEZOLID 600 MG PO TABS
600.0000 mg | ORAL_TABLET | Freq: Two times a day (BID) | ORAL | Status: DC
  Administered 2019-07-20 – 2019-07-21 (×3): 600 mg via ORAL
  Filled 2019-07-20 (×5): qty 1

## 2019-07-20 MED FILL — LINEZOLID 600 MG TABS: 600 | 10 days supply | Qty: 20 | Fill #0

## 2019-07-20 NOTE — CV Procedure (Signed)
   Transesophageal Echocardiogram  Indications: Bacteremia  Time out performed Propofol with anesthesia  Findings:  Left Ventricle: 55% EF normal  Mitral Valve: Trace MR  Aortic Valve: No AI, mild calcification of valve leaflets and calcified small density near LVOT.   Tricuspid Valve: mild TR  Left Atrium: No obvious thrombus  IMPRESSION: suboptimal images (hiatal hernia) however no obvious vegetations detected.   Candee Furbish, MD

## 2019-07-20 NOTE — Progress Notes (Signed)
OT Cancellation Note  Patient Details Name: [name] MRN: [id number] DOB: 02/28/1932   Cancelled Treatment:    Reason Eval/Treat Not Completed: Patient at procedure or test/ unavailable (TEE). Will check back as time allows.   [place], COTA/L [facility] [place] 07/20/2019, 1:16 PM

## 2019-07-20 NOTE — Anesthesia Postprocedure Evaluation (Signed)
Anesthesia Post Note  Patient: [name]  Procedure(s) Performed: TRANSESOPHAGEAL ECHOCARDIOGRAM (TEE) (N/A )     Patient location during evaluation: Endoscopy Anesthesia Type: MAC Level of consciousness: awake and alert Pain management: pain level controlled Vital Signs Assessment: post-procedure vital signs reviewed and stable Respiratory status: spontaneous breathing, nonlabored ventilation, respiratory function stable and patient connected to nasal cannula oxygen Cardiovascular status: blood pressure returned to baseline and stable Postop Assessment: no apparent nausea or vomiting Anesthetic complications: no    Last Vitals:  Vitals:   07/20/19 1135 07/20/19 1246  BP: 120/64 (!) 126/36  Pulse: 82 81  Resp: 17 12  Temp: 36.5 C   SpO2: 99% 98%    Last Pain:  Vitals:   07/20/19 1246  TempSrc:   PainSc: 0-No pain                 Barnet Glasgow

## 2019-07-20 NOTE — Transfer of Care (Signed)
Immediate Anesthesia Transfer of Care Note  Patient: [name]  Procedure(s) Performed: TRANSESOPHAGEAL ECHOCARDIOGRAM (TEE) (N/A )  Patient Location: PACU and Endoscopy Unit  Anesthesia Type:MAC  Level of Consciousness: patient cooperative and responds to stimulation  Airway & Oxygen Therapy: Patient Spontanous Breathing  Post-op Assessment: Report given to RN and Post -op Vital signs reviewed and stable  Post vital signs: Reviewed and stable  Last Vitals:  Vitals Value Taken Time  BP    Temp    Pulse    Resp    SpO2      Last Pain:  Vitals:   07/20/19 1246  TempSrc:   PainSc: 0-No pain         Complications: No apparent anesthesia complications

## 2019-07-20 NOTE — Progress Notes (Signed)
PROGRESS NOTE    Javyn Pollack  [id number] DOB: [birth year] DOA: 07/14/2019 PCP: Rutherford Guys, MD    Brief Narrative:  85 year old male who presented with hypoxemia.  He does have significant past medical history for hypertension, dyslipidemia, history of stroke, eczema and asthma.  Patient lives at home, he has a caregiver who noticed him having worsening lower extremity edema along with diffuse eczema.  Patient was taken to the urgent care where he was noted to be hypoxic and edematous and referred to the hospital for further evaluation.  On his initial physical examination his temperature was 98.8, pulse rate 97, respiratory rate 28, blood pressure 120/90, oxygen saturation 76% on room air.  His lungs had rales bilaterally, positive wheezing, heart S1-S2 present and rhythmic, positive systolic murmur at the base, abdomen soft, 3+ bilateral extremity edema. Sodium 136, potassium 4.5, chloride 102, bicarb 24, glucose 100, BUN 10, creatinine 1.0, BNP 586, troponin 8, white count 11.9, hemoglobin 14.9, hematocrit 43.6, platelets 195.  SARS COVID-19 was negative.  Chest radiograph has right bibasilar more right than left basal atelectasis.  CT chest with bilateral groundglass opacities, bilateral pleural effusions.  Right and left, right atelectasis, left thyroid mass 5.7 cm  Patient was admitted to the hospital with a working diagnosis of acute hypoxic respiratory failure due to pulmonary edema/bilateral pleural effusions likely diastolic heart failure/rule out community-acquired pneumonia.  Blood cultures resulted positive for MSSA, started on IV cefazolin with good toleration. Today for TEE. Ruled out for pneumonia.    Assessment & Plan:   Principal Problem:   MSSA bacteremia Active Problems:   Eczema   Dyslipidemia   Leg edema   Acute respiratory failure with hypoxia (HCC)   Thyroid mass   Hypoxia   Peripheral edema   Thyroid nodule   1. Acute hypoxic respiratory failure due to  diastolic heart failure exacerbation. Patient has been responding well to diuresis, since admission 5,147 ml fluid balance negative. Oxymetry 96 to 98% on room air. Now has been transitioned to 40 mg of oral furosemide. His echocardiogram has a preserved LV and RV systolic function.   2. MSSA bacteremia, in the setting of generalized erythematous rash/ pruriginous. Rash has been improving with topical lotion, follow blood cultures from 09.18 have been negative. Transthoracic echcocardiogram with no vegetation, follow on today's TEE. Will order PICC line placement for prolonged antibiotic therapy. Continue antibiotic therapy with linezolid.   3. HTN. Continue with metoprolol for blood pressure control.  4. Incidental thyroid nodule. Normal TSH, [place] with complex left cystic lesion, will need outpatient follow up/ FNA.   5. Asthma. No clinical signs of exacerbation.  6. Hx of CVA. Continue with clopidogrel and pravastatin. Ambulatory dysfunction, continue physical therapy evaluation.   7. AKI on CKD stage 2. Stable renal function with serum cr at 1,1.   DVT prophylaxis: enoxaparin   Code Status: DNR  Family Communication: no family at the bedside  Disposition Plan/ discharge barriers: pending TEE report.   Body mass index is 22.74 kg/m. Malnutrition Type:      Malnutrition Characteristics:      Nutrition Interventions:     RN Pressure Injury Documentation:     Consultants:   ID   Procedures:     Antimicrobials:   IV linezolid     Subjective: Patient is feeling better but not yet back to baseline, dyspnea has improved, no nausea or vomiting, no chest pain. Continue to be very weak and deconditioned.   Objective: Vitals:  07/19/19 1907 07/19/19 2305 07/20/19 0751 07/20/19 1135  BP:  119/64 117/82 120/64  Pulse:  84 86 82  Resp: 18 (!) 21 (!) 22 17  Temp:  97.8 F (36.6 C) 97.8 F (36.6 C) 97.7 F (36.5 C)  TempSrc:  Oral Oral Oral  SpO2:  97% 98% 99%   Weight:      Height:        Intake/Output Summary (Last 24 hours) at 07/20/2019 1248 Last data filed at 07/20/2019 1132 Gross per 24 hour  Intake 480 ml  Output 1975 ml  Net -1495 ml   Filed Weights   07/15/19 0321  Weight: 71.9 kg    Examination:   General: Not in pain or dyspnea, deconditioned  Neurology: Awake and alert, non focal  E ENT: mild pallor, no icterus, oral mucosa moist Cardiovascular: No JVD. S1-S2 present, rhythmic, no gallops, rubs, or murmurs. ++ pitting lower extremity edema. Pulmonary: positive breath sounds bilaterally, no wheezing, rhonchi or rales. Gastrointestinal. Abdomen fwith, no organomegaly, non tender, no rebound or guarding Skin. No rashes Musculoskeletal: no joint deformities     Data Reviewed: I have personally reviewed following labs and imaging studies  CBC: Recent Labs  Lab 07/16/19 0358 07/17/19 0803 07/18/19 0705 07/19/19 0617 07/20/19 0540  WBC 18.8* 10.9* 9.4 10.2 11.4*  NEUTROABS 16.0* 7.5 6.6 7.0 8.0*  HGB 12.8* 12.9* 13.0 13.6 13.0  HCT 39.0 39.5 40.9 40.3 39.3  MCV 98.5 99.0 99.5 96.6 96.3  PLT 291 251 284 247 [id number]   Basic Metabolic Panel: Recent Labs  Lab 07/16/19 0358 07/17/19 0803 07/18/19 0705 07/19/19 0617 07/20/19 0540  NA 139 138 139 137 137  K 4.0 4.7 3.6 3.3* 3.7  CL 99 99 101 98 100  CO2 30 28 31 30 28   GLUCOSE 129* 103* 85 95 104*  BUN 18 18 13 14 15   CREATININE 1.27* 1.09 1.07 1.16 1.19  CALCIUM 8.4* 7.9* 8.0* 7.9* 8.1*  MG  --  2.1  --   --   --    GFR: Estimated Creatinine Clearance: 46.2 mL/min (by C-G formula based on SCr of 1.19 mg/dL). Liver Function Tests: No results for input(s): AST, ALT, ALKPHOS, BILITOT, PROT, ALBUMIN in the last 168 hours. No results for input(s): LIPASE, AMYLASE in the last 168 hours. No results for input(s): AMMONIA in the last 168 hours. Coagulation Profile: No results for input(s): INR, PROTIME in the last 168 hours. Cardiac Enzymes: No results for input(s):  CKTOTAL, CKMB, CKMBINDEX, TROPONINI in the last 168 hours. BNP (last 3 results) No results for input(s): PROBNP in the last 8760 hours. HbA1C: No results for input(s): HGBA1C in the last 72 hours. CBG: No results for input(s): GLUCAP in the last 168 hours. Lipid Profile: No results for input(s): CHOL, HDL, LDLCALC, TRIG, CHOLHDL, LDLDIRECT in the last 72 hours. Thyroid Function Tests: No results for input(s): TSH, T4TOTAL, FREET4, T3FREE, THYROIDAB in the last 72 hours. Anemia Panel: No results for input(s): VITAMINB12, FOLATE, FERRITIN, TIBC, IRON, RETICCTPCT in the last 72 hours.    Radiology Studies: I have reviewed all of the imaging during this hospital visit personally     Scheduled Meds: . [MAR Hold] clopidogrel  75 mg Oral Daily  . [MAR Hold] enoxaparin (LOVENOX) injection  40 mg Subcutaneous Q24H  . [MAR Hold] furosemide  40 mg Oral Daily  . [MAR Hold] metoprolol succinate  50 mg Oral Daily  . [MAR Hold] pravastatin  40 mg Oral q1800  . [facility]  Hold] sodium chloride flush  3 mL Intravenous Q12H  . [MAR Hold] traZODone  50 mg Oral QHS   Continuous Infusions: . [MAR Hold] sodium chloride    . sodium chloride 20 mL/hr at 07/20/19 0850  . [MAR Hold]  ceFAZolin (ANCEF) IV 2 g (07/20/19 0506)     LOS: 6 days        Mauricio Gerome Apley, MD

## 2019-07-20 NOTE — Anesthesia Preprocedure Evaluation (Signed)
Anesthesia Evaluation  Patient identified by MRN, date of birth, ID band Patient awake    Reviewed: Allergy & Precautions, NPO status , Patient's Chart, lab work & pertinent test results  Airway Mallampati: II  TM Distance: >3 FB Neck ROM: Full    Dental no notable dental hx. (+) Teeth Intact, Dental Advisory Given   Pulmonary neg pulmonary ROS,    Pulmonary exam normal breath sounds clear to auscultation       Cardiovascular hypertension, Pt. on medications Normal cardiovascular exam Rhythm:Regular Rate:Normal     Neuro/Psych PSYCHIATRIC DISORDERS CVA    GI/Hepatic Neg liver ROS, hiatal hernia,   Endo/Other  negative endocrine ROS  Renal/GU Renal disease     Musculoskeletal negative musculoskeletal ROS (+)   Abdominal   Peds  Hematology negative hematology ROS (+)   Anesthesia Other Findings   Reproductive/Obstetrics                             Anesthesia Physical Anesthesia Plan  ASA: IV  Anesthesia Plan: General   Post-op Pain Management:    Induction:   PONV Risk Score and Plan: Treatment may vary due to age or medical condition, Dexamethasone and Ondansetron  Airway Management Planned: Nasal Cannula and Natural Airway  Additional Equipment:   Intra-op Plan:   Post-operative Plan:   Informed Consent: I have reviewed the patients History and Physical, chart, labs and discussed the procedure including the risks, benefits and alternatives for the proposed anesthesia with the patient or authorized representative who has indicated his/her understanding and acceptance.     Dental advisory given  Plan Discussed with:   Anesthesia Plan Comments:         Anesthesia Quick Evaluation

## 2019-07-20 NOTE — Interval H&P Note (Signed)
History and Physical Interval Note:  07/20/2019 1:04 PM  [name]  has presented today for surgery, with the diagnosis of BACTEREMIA.  The various methods of treatment have been discussed with the patient and family. After consideration of risks, benefits and other options for treatment, the patient has consented to  Procedure(s): TRANSESOPHAGEAL ECHOCARDIOGRAM (TEE) (N/A) as a surgical intervention.  The patient's history has been reviewed, patient examined, no change in status, stable for surgery.  I have reviewed the patient's chart and labs.  Questions were answered to the patient's satisfaction.     [employer]

## 2019-07-20 NOTE — Plan of Care (Signed)

## 2019-07-20 NOTE — Progress Notes (Signed)
[name]  Date of Admission:  07/14/2019     Total days of antibiotics 7         ASSESSMENT:  [name] appears to have simple MSSA bacteremia with no evidence of vegetations on TEE. Repeat blood cultures remain without growth to date and appear to have cleared quickly. Will plan for 2 weeks of Zyvox 600 mg twice daily using 07/16/19 as the clearance date and would end on 07/30/19 as Zyvox has equivalent IV and oral bioavailability. No need for PICC line. OK for discharge from ID perspective.   PLAN:  1. Change cefazolin to Zyvox 600 mg bid until 07/30/19. 2. Will work with transition of care pharmacy to provide medication at bedside for discharge.  3. F/U with PCP office for hospital follow up.   Principal Problem:   MSSA bacteremia Active Problems:   Acute respiratory failure with hypoxia (HCC)   Eczema   Dyslipidemia   Leg edema   Thyroid mass   Hypoxia   Peripheral edema   Thyroid nodule   . clopidogrel  75 mg Oral Daily  . enoxaparin (LOVENOX) injection  40 mg Subcutaneous Q24H  . furosemide  40 mg Oral Daily  . metoprolol succinate  50 mg Oral Daily  . pravastatin  40 mg Oral q1800  . sodium chloride flush  3 mL Intravenous Q12H  . traZODone  50 mg Oral QHS    SUBJECTIVE:  Afebrile overnight with no acute events. Feeling good today. Ready to go home.    Allergies  Allergen Reactions  . Other Shortness Of Breath, Itching and Other (See Comments)    Seasonal allergies- Runny nose, congestion, itchy eyes, and wheezing     Review of Systems: Review of Systems  Constitutional: Negative for chills, fever and weight loss.  Respiratory: Negative for cough, shortness of breath and wheezing.   Cardiovascular: Negative for chest pain and leg swelling.  Gastrointestinal: Negative for abdominal pain, constipation, diarrhea, nausea and vomiting.  Skin: Negative for rash.      OBJECTIVE: Vitals:   07/20/19 1135 07/20/19 1246  07/20/19 1401 07/20/19 1402  BP: 120/64 (!) 126/36 (!) 96/58 134/64  Pulse: 82 81 80 (!) 21  Resp: 17 12 (!) 28 (!) 25  Temp: 97.7 F (36.5 C)  (!) 97.3 F (36.3 C)   TempSrc: Oral  Temporal   SpO2: 99% 98% 96% 98%  Weight:      Height:       Body mass index is 22.74 kg/m.  Physical Exam Constitutional:      General: He is not in acute distress.    Appearance: He is well-developed.  Cardiovascular:     Rate and Rhythm: Normal rate and regular rhythm.     Heart sounds: Normal heart sounds.  Pulmonary:     Effort: Pulmonary effort is normal.     Breath sounds: Normal breath sounds.  Skin:    General: Skin is warm and dry.  Neurological:     Mental Status: He is alert and oriented to person, place, and time.  Psychiatric:        Behavior: Behavior normal.        Thought Content: Thought content normal.        Judgment: Judgment normal.     Lab Results Lab Results  Component Value Date   WBC 11.4 (H) 07/20/2019   HGB 13.0 07/20/2019   HCT 39.3 07/20/2019   MCV 96.3 07/20/2019   PLT  266 07/20/2019    Lab Results  Component Value Date   CREATININE 1.19 07/20/2019   BUN 15 07/20/2019   NA 137 07/20/2019   K 3.7 07/20/2019   CL 100 07/20/2019   CO2 28 07/20/2019    Lab Results  Component Value Date   ALT 13 04/30/2019   AST 20 04/30/2019   ALKPHOS 76 04/30/2019   BILITOT 1.1 04/30/2019     Microbiology: Recent Results (from the past 240 hour(s))  Culture, blood (routine x 2)     Status: Abnormal   Collection Time: 07/14/19  2:18 PM   Specimen: BLOOD  Result Value Ref Range Status   Specimen Description BLOOD RIGHT ANTECUBITAL  Final   Special Requests   Final    BOTTLES DRAWN AEROBIC AND ANAEROBIC Blood Culture adequate volume   Culture  Setup Time   Final    GRAM POSITIVE COCCI IN BOTH AEROBIC AND ANAEROBIC BOTTLES CRITICAL RESULT CALLED TO, READ BACK BY AND VERIFIED WITH: PHRMD V BRYK @0545  07/15/19 BY S GEZAHEGN Performed at [facility], [place] [address], [place], [place] [address]    Culture STAPHYLOCOCCUS AUREUS (A)  Final   Report Status 07/17/2019 FINAL  Final   Organism ID, Bacteria STAPHYLOCOCCUS AUREUS  Final      Susceptibility   Staphylococcus aureus - MIC*    CIPROFLOXACIN >=8 RESISTANT Resistant     ERYTHROMYCIN 4 INTERMEDIATE Intermediate     GENTAMICIN <=0.5 SENSITIVE Sensitive     OXACILLIN <=0.25 SENSITIVE Sensitive     TETRACYCLINE <=1 SENSITIVE Sensitive     VANCOMYCIN 1 SENSITIVE Sensitive     TRIMETH/SULFA <=10 SENSITIVE Sensitive     CLINDAMYCIN <=0.25 SENSITIVE Sensitive     RIFAMPIN <=0.5 SENSITIVE Sensitive     Inducible Clindamycin NEGATIVE Sensitive     * STAPHYLOCOCCUS AUREUS  Blood Culture ID Panel (Reflexed)     Status: Abnormal   Collection Time: 07/14/19  2:18 PM  Result Value Ref Range Status   Enterococcus species NOT DETECTED NOT DETECTED Final   Listeria monocytogenes NOT DETECTED NOT DETECTED Final   Staphylococcus species DETECTED (A) NOT DETECTED Final    Comment: CRITICAL RESULT CALLED TO, READ BACK BY AND VERIFIED WITH: PHRMD V BRYK @0545  07/15/19 BY S GEZAHEGN    Staphylococcus aureus (BCID) DETECTED (A) NOT DETECTED Final    Comment: Methicillin (oxacillin) susceptible Staphylococcus aureus (MSSA). Preferred therapy is anti staphylococcal beta lactam antibiotic (Cefazolin or Nafcillin), unless clinically contraindicated. CRITICAL RESULT CALLED TO, READ BACK BY AND VERIFIED WITH: PHRMD V BRYK @0545  07/15/19 BY S GEZAHEGN    Methicillin resistance NOT DETECTED NOT DETECTED Final   Streptococcus species NOT DETECTED NOT DETECTED Final   Streptococcus agalactiae NOT DETECTED NOT DETECTED Final   Streptococcus pneumoniae NOT DETECTED NOT DETECTED Final   Streptococcus pyogenes NOT DETECTED NOT DETECTED Final   Acinetobacter baumannii NOT DETECTED NOT DETECTED Final   Enterobacteriaceae species NOT DETECTED NOT DETECTED Final   Enterobacter cloacae complex NOT DETECTED NOT  DETECTED Final   Escherichia coli NOT DETECTED NOT DETECTED Final   Klebsiella oxytoca NOT DETECTED NOT DETECTED Final   Klebsiella pneumoniae NOT DETECTED NOT DETECTED Final   Proteus species NOT DETECTED NOT DETECTED Final   Serratia marcescens NOT DETECTED NOT DETECTED Final   Haemophilus influenzae NOT DETECTED NOT DETECTED Final   Neisseria meningitidis NOT DETECTED NOT DETECTED Final   Pseudomonas aeruginosa NOT DETECTED NOT DETECTED Final   Candida albicans NOT DETECTED NOT DETECTED  Final   Candida glabrata NOT DETECTED NOT DETECTED Final   Candida krusei NOT DETECTED NOT DETECTED Final   Candida parapsilosis NOT DETECTED NOT DETECTED Final   Candida tropicalis NOT DETECTED NOT DETECTED Final    Comment: Performed at [facility], [place] [address], [place], [place] [address]  SARS CORONAVIRUS 2 (TAT 6-24 HRS) Nasopharyngeal Nasopharyngeal Swab     Status: None   Collection Time: 07/14/19  2:27 PM   Specimen: Nasopharyngeal Swab  Result Value Ref Range Status   SARS Coronavirus 2 NEGATIVE NEGATIVE Final    Comment: (NOTE) SARS-CoV-2 target nucleic acids are NOT DETECTED. The SARS-CoV-2 RNA is generally detectable in upper and lower respiratory specimens during the acute phase of infection. Negative results do not preclude SARS-CoV-2 infection, do not rule out co-infections with other pathogens, and should not be used as the sole basis for treatment or other patient management decisions. Negative results must be combined with clinical observations, patient history, and epidemiological information. The expected result is Negative. Fact Sheet for Patients: SugarRoll.be Fact Sheet for Healthcare Providers: https://www.woods-mathews.com/ This test is not yet approved or cleared by the [place] FDA and  has been authorized for detection and/or diagnosis of SARS-CoV-2 by FDA under an Emergency Use Authorization (EUA). This EUA will  remain  in effect (meaning this test can be used) for the duration of the COVID-19 declaration under Section 56 4(b)(1) of the Act, 21 U.S.C. section 360bbb-3(b)(1), unless the authorization is terminated or revoked sooner. Performed at [facility], [place] [address] [place], [place] [address]   Culture, blood (routine x 2)     Status: Abnormal   Collection Time: 07/14/19  9:58 PM   Specimen: BLOOD RIGHT HAND  Result Value Ref Range Status   Specimen Description BLOOD RIGHT HAND  Final   Special Requests   Final    BOTTLES DRAWN AEROBIC AND ANAEROBIC Blood Culture adequate volume   Culture  Setup Time   Final    GRAM POSITIVE COCCI ANAEROBIC BOTTLE ONLY CRITICAL VALUE NOTED.  VALUE IS CONSISTENT WITH PREVIOUSLY REPORTED AND CALLED VALUE.    Culture (A)  Final    STAPHYLOCOCCUS AUREUS SUSCEPTIBILITIES PERFORMED ON PREVIOUS CULTURE WITHIN THE LAST 5 DAYS. Performed at [facility], [place] [address], [place], [place] [address]    Report Status 07/17/2019 FINAL  Final  Culture, blood (routine x 2)     Status: None (Preliminary result)   Collection Time: 07/16/19  3:57 AM   Specimen: BLOOD  Result Value Ref Range Status   Specimen Description BLOOD LEFT ANTECUBITAL  Final   Special Requests   Final    BOTTLES DRAWN AEROBIC AND ANAEROBIC Blood Culture adequate volume   Culture   Final    NO GROWTH 4 DAYS Performed at [facility], [place] [address], [place], [place] [address]    Report Status PENDING  Incomplete  Culture, blood (routine x 2)     Status: None (Preliminary result)   Collection Time: 07/16/19  3:58 AM   Specimen: BLOOD  Result Value Ref Range Status   Specimen Description BLOOD RIGHT ANTECUBITAL  Final   Special Requests   Final    BOTTLES DRAWN AEROBIC AND ANAEROBIC Blood Culture adequate volume   Culture   Final    NO GROWTH 4 DAYS Performed at [facility], [place] [address], [place], [place] [address]    Report Status PENDING  Incomplete  Terri Piedra, NP [facility] [phone] Pager  07/20/2019  2:12 PM

## 2019-07-20 NOTE — Progress Notes (Signed)
Echocardiogram Echocardiogram Transesophageal has been performed.  Oneal Deputy [place] Virrueta 07/20/2019, 1:44 PM

## 2019-07-20 NOTE — Progress Notes (Signed)
Occupational Therapy Treatment Patient Details Name: [name] MRN: [id number] DOB: Oct 06, 1932 Today's Date: 07/20/2019    History of present illness Patient is a 85 y/o male admitted with dyspnea, SpO2 70's in ED, and positive MSSA bacteremia.. Past medical history significant of seasonal allergies, basal cell carcinoma, hyperlipidemia, hypertension, osteoporosis and eczema.   OT comments  Pt making steady progress towards OT goals this session. Pt reports fatigue from TEE this AM; pt declining OOB functional mobility. Session focus on BUE HEP with level 1 theraband from chair position in bed ( see exercise section). Pt on RA throughout session with O2 sats being monitored throughout. Pt O2 sats dropped to low 80s but rebounded with 1 minute rest break. HR elevated with activity to 130. DC plan remains appropriate. Will continue to follow for acute OT needs.   Follow Up Recommendations  No OT follow up;Supervision/Assistance - 24 hour    Equipment Recommendations  None recommended by OT    Recommendations for Other Services      Precautions / Restrictions Precautions Precautions: Fall Precaution Comments: monitor O2 Sats Restrictions Weight Bearing Restrictions: No       Mobility Bed Mobility               General bed mobility comments: assisted with positioning in bed; MIN A to readjust hips to complete BUE HEP at bed level  Transfers                 General transfer comment: declined OOB transfer    Balance                                           ADL either performed or assessed with clinical judgement   ADL Overall ADL's : Needs assistance/impaired                                       General ADL Comments: pt declining ADL participation; reports being tired after TEE this AM     Vision Patient Visual Report: No change from baseline     Perception     Praxis      Cognition Arousal/Alertness:  Awake/alert Behavior During Therapy: WFL for tasks assessed/performed Overall Cognitive Status: Within Functional Limits for tasks assessed                                 General Comments: pleasant and attentive throughout session        Exercises General Exercises - Upper Extremity Shoulder Flexion: AROM;Both;10 reps;Seated;Theraband Theraband Level (Shoulder Flexion): Level 1 (Yellow) Shoulder Extension: AROM;Both;10 reps;Seated;Theraband Theraband Level (Shoulder Extension): Level 1 (Yellow) Elbow Flexion: AROM;Both;10 reps;Seated;Theraband Theraband Level (Elbow Flexion): Level 1 (Yellow) Elbow Extension: AROM;Both;10 reps;Seated;Theraband Theraband Level (Elbow Extension): Level 1 (Yellow) Shoulder Exercises Shoulder Flexion: AROM;Both;10 reps;Seated;Theraband Theraband Level (Shoulder Flexion): Level 1 (Yellow) Shoulder Extension: AROM;Both;10 reps;Seated;Theraband Theraband Level (Shoulder Extension): Level 1 (Yellow)   Shoulder Instructions       General Comments      Pertinent Vitals/ Pain       Pain Assessment: No/denies pain  Home Living  Prior Functioning/Environment              Frequency  Min 2X/week        Progress Toward Goals  OT Goals(current goals can now be found in the care plan section)  Progress towards OT goals: Progressing toward goals  Acute Rehab OT Goals OT Goal Formulation: With patient Time For Goal Achievement: 08/01/19 Potential to Achieve Goals: Good  Plan      Co-evaluation                 AM-PAC OT "6 Clicks" Daily Activity     Outcome Measure   Help from another person eating meals?: None Help from another person taking care of personal grooming?: A Little Help from another person toileting, which includes using toliet, bedpan, or urinal?: A Little Help from another person bathing (including washing, rinsing, drying)?: A Lot Help  from another person to put on and taking off regular upper body clothing?: A Little Help from another person to put on and taking off regular lower body clothing?: A Lot 6 Click Score: 17    End of Session    OT Visit Diagnosis: Unsteadiness on feet (R26.81);Muscle weakness (generalized) (M62.81)   Activity Tolerance Patient tolerated treatment well   Patient Left with call bell/phone within reach;in bed   Nurse Communication Mobility status        Time: [id number] OT Time Calculation (min): 19 min  Charges: OT Treatments $Therapeutic Exercise: 8-22 mins  [place], COTA/L Acute Rehabilitation Services [phone] [place] 07/20/2019, 4:10 PM

## 2019-07-21 ENCOUNTER — Encounter (HOSPITAL_COMMUNITY): Payer: Self-pay | Admitting: Cardiology

## 2019-07-21 DIAGNOSIS — E041 Nontoxic single thyroid nodule: Secondary | ICD-10-CM

## 2019-07-21 LAB — CULTURE, BLOOD (ROUTINE X 2)
Culture: NO GROWTH
Culture: NO GROWTH
Special Requests: ADEQUATE
Special Requests: ADEQUATE

## 2019-07-21 LAB — BASIC METABOLIC PANEL
Anion gap: 10 (ref 5–15)
BUN: 16 mg/dL (ref 8–23)
CO2: 25 mmol/L (ref 22–32)
Calcium: 8 mg/dL — ABNORMAL LOW (ref 8.9–10.3)
Chloride: 102 mmol/L (ref 98–111)
Creatinine, Ser: 1.42 mg/dL — ABNORMAL HIGH (ref 0.61–1.24)
GFR calc Af Amer: 52 mL/min — ABNORMAL LOW (ref >60)
GFR calc non Af Amer: 45 mL/min — ABNORMAL LOW (ref >60)
Glucose, Bld: 93 mg/dL (ref 70–99)
Potassium: 4.7 mmol/L (ref 3.5–5.1)
Sodium: 137 mmol/L (ref 135–145)

## 2019-07-21 MED ORDER — FUROSEMIDE 40 MG PO TABS: 40.0000 mg | ORAL_TABLET | Freq: Every day | ORAL | 0 refills | Status: DC

## 2019-07-21 MED ORDER — FUROSEMIDE 40 MG PO TABS: 20.0000 mg | ORAL_TABLET | Freq: Every day | ORAL | 0 refills | Status: DC

## 2019-07-21 MED ORDER — DOCUSATE SODIUM 100 MG PO CAPS: 100.0000 mg | ORAL_CAPSULE | Freq: Every day | ORAL | Status: DC | PRN

## 2019-07-21 NOTE — Discharge Summary (Signed)
Physician Discharge Summary  [name] NWG:[id number] DOB: 1932-07-24  PCP: Rutherford Guys, MD  Admitted from: Home Discharged to: Home  Admit date: 07/14/2019 Discharge date: 07/21/2019  Recommendations for Outpatient Follow-up:   Follow-up Information    Rutherford Guys, MD. Schedule an appointment as soon as possible for a visit in 1 week(s).   Specialty: Family Medicine Why: To be seen with repeat labs (CBC & BMP). Contact information: [address] [place] [address] [phone]         Close outpatient follow-up with PCP also to follow-up regarding further evaluation of incidental thyroid nodule as deemed necessary.   Home Health: PT Equipment/Devices: None  Discharge Condition: Improved and stable CODE STATUS: DNR Diet recommendation: Heart healthy diet.  Discharge Diagnoses:  Principal Problem:   MSSA bacteremia Active Problems:   Eczema   Dyslipidemia   Leg edema   Acute respiratory failure with hypoxia (HCC)   Thyroid mass   Hypoxia   Peripheral edema   Thyroid nodule   Brief Summary: 85 year old male with PMH of HTN, HLD, stroke, eczema and asthma presented to the ED on 9/16 from home by his caregiver for worsening lower extremity edema along with diffuse eczema.  Patient was taken to the urgent care center where he was noted to be hypoxic and edematous and referred to the hospital for further evaluation.  In the ED, patient was hemodynamically stable.  Oxygen saturation was 76% on room air for which she was started on supplemental oxygen via nasal cannula.  He had 3+ pitting bilateral leg edema.  SARS COVID-19 test was negative.  CT chest showed bilateral groundglass opacities, bilateral pleural effusions.  He was hospitalized for evaluation and management of acute hypoxic respiratory failure due to pulmonary edema/bilateral pleural effusions likely diastolic heart failure/rule out community-acquired pneumonia.  He then ruled in for MSSA  bacteremia.  Assessment and plan:  Acute hypoxic respiratory failure secondary to Acute exacerbation of chronic diastolic congestive heart failure  -Presented with bilateral pedal edema, found to have pulmonary edema and bilateral pleural effusions -CTA chest negative for PE.  Lower extremity venous Dopplers negative for DVT. -Initially diuresed with Lasix IV 40 mg twice daily.    Following improvement he was transitioned to oral Lasix.  Currently on Lasix 40 mg oral daily. -Initially required oxygen supplementation.  Currently maintaining oxygen saturation on room air.  On day of discharge, patient saturating in the high 90s on room air with activity as per RN report. -Continue metoprolol XL 50 mg daily. -Echocardiogram showed 55 to 60% EF, no vegetation.  -Hypoxia resolved.  CHF improved.  Still has mild leg edema which can be addressed with oral Lasix as outpatient. - Patient reportedly uses O2 2L/min at night.  MSSA bacteremia -in the setting of generalized erythematous rash/ pruriginous.  -Blood cultures obtained on admission resulted positive for MSSA bacteremia in 2 bottles. -ID consult appreciated.  Echocardiogram did not show any vegetation.  -TEE on 9/22- for vegetations.  Detailed report as noted below. -As per ID follow-up yesterday, plan for 2 weeks of oral Zyvox 600 mg twice daily using 07/16/2019 as the clearance date and end date of 07/30/2019.  Oral Zyvox chosen since it has equivalent IV and oral bioavailability.  Hence no need for PICC line.  ID cleared him for discharge home yesterday with outpatient follow-up with his PCP. -Zyvox has been procured for patient from the [facility].  AKI -Creatinine peaked at 1.27 on 9/18. Creatinine had improved  to 1.19 yesterday but has increased to 1.42 today.  Possibly due to diuresis.  Reduce Lasix from 40 mg daily to 20 mg daily at discharge.  Close outpatient follow-up with PCP with repeat BMP in a week's time and can adjust  diuretics at this time as needed.  HTN -Continue metoprolol and Lasix for blood pressure control.   Hx of ischemic CVA -Continue with clopidogrel and pravastatin.  Incidental thyroid nodule. - 5.7 cm left thyroid mass noted in CT chest.  - Ultrasonographyshowed asymmetric thyromegaly secondary to complex cystic lesion.  TSH normal.  - Outpatient follow-up as deemed necessary, as per radiology, consider FNA C..  Asthma.  No clinical signs of exacerbation.  Continue PRN bronchodilators  Impaired mobility  -PT eval obtained.  Recommended home health PT with 24-hour supervision when ready for discharge. -Patient indicates that he has a 24/7 caregiver at home to assist him and his daughter who lives close by peeks on him regularly.   Consultations:  ID  Cardiology for TEE  Procedures:  TEE 9/22 PROCEDURE: No evidence of vegetation. Patients was monitored while under deep sedation. The transesophogeal probe was passed through the esophogus of the patient. The patient developed no complications during the procedure.  IMPRESSIONS    1. Left ventricular ejection fraction, by visual estimation, is 55 to 60%. The left ventricle has normal function. There is no left ventricular hypertrophy.  2. Global right ventricle has normal systolic function.The right ventricular size is normal. No increase in right ventricular wall thickness.  3. Left atrial size was normal.  4. Right atrial size was normal.  5. The mitral valve is normal in structure. Mild mitral valve regurgitation.  6. The tricuspid valve is normal in structure. Tricuspid valve regurgitation is mild.  7. The aortic valve is tricuspid Aortic valve regurgitation was not visualized by color flow Doppler. Mild to moderate aortic valve sclerosis/calcification without any evidence of aortic stenosis.  8. The pulmonic valve was normal in structure. Pulmonic valve regurgitation is not visualized by color flow Doppler.  9.  Minimal plaque invoving the ascending, transverse and descending aorta. 10. Despite suboptimal images (hiatal hernia), no vegetations detected.  Discharge Instructions  Discharge Instructions    (HEART FAILURE PATIENTS) Call MD:  Anytime you have any of the following symptoms: 1) 3 pound weight gain in 24 hours or 5 pounds in 1 week 2) shortness of breath, with or without a dry hacking cough 3) swelling in the hands, feet or stomach 4) if you have to sleep on extra pillows at night in order to breathe.   Complete by: As directed    Call MD for:  difficulty breathing, headache or visual disturbances   Complete by: As directed    Call MD for:  extreme fatigue   Complete by: As directed    Call MD for:  persistant dizziness or light-headedness   Complete by: As directed    Call MD for:  persistant nausea and vomiting   Complete by: As directed    Call MD for:  redness, tenderness, or signs of infection (pain, swelling, redness, odor or green/yellow discharge around incision site)   Complete by: As directed    Call MD for:  severe uncontrolled pain   Complete by: As directed    Call MD for:  temperature >100.4   Complete by: As directed    Diet - low sodium heart healthy   Complete by: As directed    Increase activity slowly   Complete  by: As directed        Medication List    STOP taking these medications   senna-docusate 8.6-50 MG tablet Commonly known as: Senokot-S     TAKE these medications   acetaminophen 500 MG tablet Commonly known as: TYLENOL Take 500-1,000 mg by mouth as needed for mild pain or headache (pain from eczema).   albuterol 108 (90 Base) MCG/ACT inhaler Commonly known as: VENTOLIN HFA Inhale 2 puffs into the lungs every 6 (six) hours as needed for wheezing or shortness of breath.   alendronate 70 MG tablet Commonly known as: FOSAMAX Take 1 tablet (70 mg total) by mouth every 7 (seven) days. Take with a full glass of water on an empty stomach. What  changed:   when to take this  additional instructions   Blood Pressure Kit Please check BP twice a day   clopidogrel 75 MG tablet Commonly known as: PLAVIX TAKE 1 TABLET ONCE DAILY. Notes to patient: 06/21/2019 AM   docusate sodium 100 MG capsule Commonly known as: COLACE Take 1 capsule (100 mg total) by mouth daily as needed for mild constipation.   furosemide 40 MG tablet Commonly known as: LASIX Take 0.5 tablets (20 mg total) by mouth daily. Start taking on: July 22, 2019   linezolid 600 MG tablet Commonly known as: ZYVOX Take 1 tablet (600 mg total) by mouth 2 (two) times daily for 10 days. Notes to patient: 07/22/2019 AM   metoprolol succinate 50 MG 24 hr tablet Commonly known as: TOPROL-XL TAKE 1 TABLET DAILY WITH OR IMMEDIATELY FOLLOWING A MEAL. What changed: See the new instructions. Notes to patient: 07/22/2019 AM   pravastatin 40 MG tablet Commonly known as: PRAVACHOL TAKE 1 TABLET ONCE DAILY.   Qvar RediHaler 40 MCG/ACT inhaler Generic drug: beclomethasone Inhale 1 puff into the lungs 2 (two) times daily.   traZODone 50 MG tablet Commonly known as: DESYREL Take 1 tablet (50 mg total) by mouth at bedtime.   Walker Misc Use walker whenever walking, standard walker      Allergies  Allergen Reactions  . Other Shortness Of Breath, Itching and Other (See Comments)    Seasonal allergies- Runny nose, congestion, itchy eyes, and wheezing      Procedures/Studies: Ct Angio Chest Pe W And/or Wo Contrast  Result Date: 07/14/2019 CLINICAL DATA:  Per is [place] caregiver pt is here with multiple complaints, SOB with tremors x2 days. Pt also has "severe eczema" flare up. Pt uses 2L [place] at night, pt sats 76% in triage, no O2 tank with him. EXAM: CT ANGIOGRAPHY CHEST WITH CONTRAST TECHNIQUE: Multidetector CT imaging of the chest was performed using the standard protocol during bolus administration of intravenous contrast. Multiplanar CT image reconstructions and  MIPs were obtained to evaluate the vascular anatomy. CONTRAST:  36m OMNIPAQUE IOHEXOL 350 MG/ML SOLN COMPARISON:  Chest x-ray on 07/14/2019 FINDINGS: Cardiovascular: The pulmonary arteries are well opacified. There is no acute pulmonary embolus. Heart is enlarged. Dilated LEFT atrium. Pulmonary venous congestion. There is mitral annulus calcification or prior repair. Extensive atherosclerotic calcification of the coronary arteries. There is atherosclerosis of the thoracic aorta not associated with aneurysm. No evidence for dissection. Mediastinum/Nodes: There is a esophageal wall thickening throughout its thoracic course. Large hiatal hernia. Low-attenuation lesion is identified within the LEFT lobe of the thyroid gland, measuring 5.7 x 4.1 centimeters. Mural nodule is identified along its inferior aspect which measures 1.1 centimeters. The mass displaces the trachea towards the RIGHT. Visualized RIGHT lobe of the  thyroid gland is normal in appearance. Precarinal lymph node is 1.2 centimeters. Small RIGHT paratracheal lymph nodes are also present. Lungs/Pleura: RIGHT pleural effusion and RIGHT basilar consolidation. Small LEFT pleural effusion and basilar atelectasis. Subpleural reticular changes are identified within the dependent aspects of the lung. There is septal thickening and the findings are consistent with pulmonary edema. Upper Abdomen: Gallbladder is present. Large hiatal hernia. Musculoskeletal: Remote wedge compression fracture of T5. No acute osseous abnormality. Review of the MIP images confirms the above findings. IMPRESSION: 1. Technically adequate exam showing no acute pulmonary embolus. 2. Marked dilatation of the LEFT atrium. There is high attenuation material in the region of the mitral annulus, consistent prior repair or extensive calcification. 3. Bilateral pleural effusions, pulmonary venous congestion, and parenchymal changes of pulmonary edema. 4. RIGHT LOWER lobe consolidation or  atelectasis. LEFT LOWER lobe atelectasis. 5. coronary artery disease. 6. Large hiatal hernia. Diffuse esophageal wall thickening, raising the question of esophagitis. 7. LEFT thyroid mass measuring 5.7 cm. Recommend further evaluation with thyroid ultrasound. 8. Remote T5 wedge compression fracture. 9. Aortic Atherosclerosis (ICD10-I70.0). Electronically Signed   By: Nolon Nations M.D.   On: 07/14/2019 17:30   Dg Chest Portable 1 View  Result Date: 07/14/2019 CLINICAL DATA:  Per is home health caregiver pt is here with multiple complaints, SOB with tremors x2 days. Pt also has "severe eczema" flare up. Pt uses 2L [employer] at night, pt sats 76% in triage, no O2 tank with him. sob EXAM: PORTABLE CHEST 1 VIEW COMPARISON:  05/02/2019 FINDINGS: There is stable elevation of RIGHT hemidiaphragm. The heart size is normal. Stable hiatal hernia. The lungs are free of focal consolidations and pleural effusions. No pulmonary edema. Remote rib fractures. Chronic changes in the RIGHT shoulder. IMPRESSION: 1. No evidence for acute cardiopulmonary abnormality. 2. Stable hiatal hernia. Electronically Signed   By: Nolon Nations M.D.   On: 07/14/2019 14:22   US Thyroid  Result Date: 07/15/2019 CLINICAL DATA:  Left nodule noted on recent CT chest EXAM: THYROID ULTRASOUND TECHNIQUE: Ultrasound examination of the thyroid gland and adjacent soft tissues was performed. COMPARISON:  CT 07/14/2019 FINDINGS: Parenchymal Echotexture: Mildly heterogenous Isthmus: 0.5 cm thickness Right lobe: 2.8 x 1.4 x 1.3 cm Left lobe: 5.5 x 4.4 x 3.9 cm _________________________________________________________ Estimated total number of nodules >/= 1 cm: 1 Number of spongiform nodules >/=  2 cm not described below (TR1): 0 Number of mixed cystic and solid nodules >/= 1.5 cm not described below (TR2): 0 _________________________________________________________ Nodule # 1: Location: Left; Mid Maximum size: 5.5 cm; Other 2 dimensions: 4.4 x 3.6 cm  Composition: mixed cystic and solid (1) Echogenicity: hypoechoic (2) Shape: not taller-than-wide (0) Margins: smooth (0) Echogenic foci: none (0) ACR TI-RADS total points: 3. ACR TI-RADS risk category: TR3 (3 points). ACR TI-RADS recommendations: **Given size (>/= 2.5 cm) and appearance, fine needle aspiration of this mildly suspicious nodule should be considered based on TI-RADS criteria. IMPRESSION: 1. Asymmetric thyromegaly secondary to complex left cystic lesion. This meets criteria for FNA biopsy, if clinically appropriate. The above is in keeping with the ACR TI-RADS recommendations - J Am Coll Radiol 2017;14:587-595. Electronically Signed   By: Lucrezia Europe M.D.   On: 07/15/2019 10:24   Vas [place] Lower Extremity Venous (dvt)  Result Date: 07/15/2019  Lower Venous Study Indications: Swelling.  Limitations: Poor ultrasound/tissue interface. Performing Technologist: Maudry Mayhew MHA, RDMS, RVT, RDCS  Examination Guidelines: A complete evaluation includes B-mode imaging, spectral Doppler, color Doppler, and power Doppler  as needed of all accessible portions of each vessel. Bilateral testing is considered an integral part of a complete examination. Limited examinations for reoccurring indications may be performed as noted.  +---------+---------------+---------+-----------+----------+--------------+ RIGHT    CompressibilityPhasicitySpontaneityPropertiesThrombus Aging +---------+---------------+---------+-----------+----------+--------------+ CFV      Full           No       Yes                                 +---------+---------------+---------+-----------+----------+--------------+ FV Prox  Full                                                        +---------+---------------+---------+-----------+----------+--------------+ FV Mid   Full                                                        +---------+---------------+---------+-----------+----------+--------------+ FV  DistalFull                                                        +---------+---------------+---------+-----------+----------+--------------+ PFV      Full                                                        +---------+---------------+---------+-----------+----------+--------------+ POP      Full           No       Yes                                 +---------+---------------+---------+-----------+----------+--------------+ PTV      Full                                                        +---------+---------------+---------+-----------+----------+--------------+ PERO     Full                                                        +---------+---------------+---------+-----------+----------+--------------+ Unable to visualize right saphenofemoral junction.  +---------+---------------+---------+-----------+----------+--------------+ LEFT     CompressibilityPhasicitySpontaneityPropertiesThrombus Aging +---------+---------------+---------+-----------+----------+--------------+ CFV      Full           No       Yes                                 +---------+---------------+---------+-----------+----------+--------------+ FV Prox  Full                                                        +---------+---------------+---------+-----------+----------+--------------+ FV Mid   Full                                                        +---------+---------------+---------+-----------+----------+--------------+ FV DistalFull                                                        +---------+---------------+---------+-----------+----------+--------------+ POP      Full           No       Yes                                 +---------+---------------+---------+-----------+----------+--------------+ Unable to visualize left saphenofemoral junction, profunda femoral, posterior tibial, and peroneal veins.  Summary: Right: There is no evidence of deep  vein thrombosis in the lower extremity. However, portions of this examination were limited- see technologist comments above. No cystic structure found in the popliteal fossa. Left: There is no evidence of deep vein thrombosis in the lower extremity. However, portions of this examination were limited- see technologist comments above. No cystic structure found in the popliteal fossa.  Bilateral pulsatile venous flow is suggestive of possible elevated right heart pressure. *See table(s) above for measurements and observations. Electronically signed by Servando Snare MD on 07/15/2019 at 4:36:48 PM.    Final    [place] Ekg Site Rite  Result Date: 07/20/2019 If Site Rite image not attached, placement could not be confirmed due to current cardiac rhythm.     Subjective: Patient denies complaints.  Anxious to go home.  Denies dyspnea or pain.  Reports that leg swelling has improved.  As per RN, no acute issues noted.  Discharge Exam:  Vitals:   07/20/19 1646 07/20/19 2223 07/21/19 0806 07/21/19 1142  BP: 101/81 (!) 102/58 118/67   Pulse: 91 82 91   Resp: 20  20   Temp: (!) 97.5 F (36.4 C) 98.6 F (37 C) (!) 97.5 F (36.4 C)   TempSrc: Axillary Axillary Oral   SpO2: 100% 93% 96% 91%  Weight:      Height:        General: Pleasant elderly male, moderately built and nourished sitting up comfortably in reclining chair this morning without distress. Cardiovascular: S1 & S2 heard, RRR, S1/S2 +. No murmurs, rubs, gallops or clicks.  Trace bilateral leg edema.  Telemetry personally reviewed: Baseline artifacts.  Otherwise sinus rhythm with occasional bigeminy/trigeminy. Respiratory: Slightly diminished breath sounds in the bases but otherwise clear to auscultation without wheezing, rhonchi or crackles. No increased work of breathing. Abdominal:  Non distended, non tender & soft. No organomegaly or masses appreciated. Normal bowel sounds heard. CNS: Alert and oriented. No focal deficits. Extremities: no  edema, no cyanosis Skin: dry and flaky.    The results of  significant diagnostics from this hospitalization (including imaging, microbiology, ancillary and laboratory) are listed below for reference.     Microbiology: Recent Results (from the past 240 hour(s))  Culture, blood (routine x 2)     Status: Abnormal   Collection Time: 07/14/19  2:18 PM   Specimen: BLOOD  Result Value Ref Range Status   Specimen Description BLOOD RIGHT ANTECUBITAL  Final   Special Requests   Final    BOTTLES DRAWN AEROBIC AND ANAEROBIC Blood Culture adequate volume   Culture  Setup Time   Final    GRAM POSITIVE COCCI IN BOTH AEROBIC AND ANAEROBIC BOTTLES CRITICAL RESULT CALLED TO, READ BACK BY AND VERIFIED WITH: PHRMD V BRYK _0  07/15/19 BY S GEZAHEGN Performed at [facility], [place] [address], [place], [place] [address]    Culture STAPHYLOCOCCUS AUREUS (A)  Final   Report Status 07/17/2019 FINAL  Final   Organism ID, Bacteria STAPHYLOCOCCUS AUREUS  Final      Susceptibility   Staphylococcus aureus - MIC*    CIPROFLOXACIN >=8 RESISTANT Resistant     ERYTHROMYCIN 4 INTERMEDIATE Intermediate     GENTAMICIN <=0.5 SENSITIVE Sensitive     OXACILLIN <=0.25 SENSITIVE Sensitive     TETRACYCLINE <=1 SENSITIVE Sensitive     VANCOMYCIN 1 SENSITIVE Sensitive     TRIMETH/SULFA <=10 SENSITIVE Sensitive     CLINDAMYCIN <=0.25 SENSITIVE Sensitive     RIFAMPIN <=0.5 SENSITIVE Sensitive     Inducible Clindamycin NEGATIVE Sensitive     * STAPHYLOCOCCUS AUREUS  Blood Culture ID Panel (Reflexed)     Status: Abnormal   Collection Time: 07/14/19  2:18 PM  Result Value Ref Range Status   Enterococcus species NOT DETECTED NOT DETECTED Final   Listeria monocytogenes NOT DETECTED NOT DETECTED Final   Staphylococcus species DETECTED (A) NOT DETECTED Final    Comment: CRITICAL RESULT CALLED TO, READ BACK BY AND VERIFIED WITH: PHRMD V BRYK _1  07/15/19 BY S GEZAHEGN    Staphylococcus aureus (BCID) DETECTED (A) NOT  DETECTED Final    Comment: Methicillin (oxacillin) susceptible Staphylococcus aureus (MSSA). Preferred therapy is anti staphylococcal beta lactam antibiotic (Cefazolin or Nafcillin), unless clinically contraindicated. CRITICAL RESULT CALLED TO, READ BACK BY AND VERIFIED WITH: PHRMD V BRYK _2  07/15/19 BY S GEZAHEGN    Methicillin resistance NOT DETECTED NOT DETECTED Final   Streptococcus species NOT DETECTED NOT DETECTED Final   Streptococcus agalactiae NOT DETECTED NOT DETECTED Final   Streptococcus pneumoniae NOT DETECTED NOT DETECTED Final   Streptococcus pyogenes NOT DETECTED NOT DETECTED Final   Acinetobacter baumannii NOT DETECTED NOT DETECTED Final   Enterobacteriaceae species NOT DETECTED NOT DETECTED Final   Enterobacter cloacae complex NOT DETECTED NOT DETECTED Final   Escherichia coli NOT DETECTED NOT DETECTED Final   Klebsiella oxytoca NOT DETECTED NOT DETECTED Final   Klebsiella pneumoniae NOT DETECTED NOT DETECTED Final   Proteus species NOT DETECTED NOT DETECTED Final   Serratia marcescens NOT DETECTED NOT DETECTED Final   Haemophilus influenzae NOT DETECTED NOT DETECTED Final   Neisseria meningitidis NOT DETECTED NOT DETECTED Final   Pseudomonas aeruginosa NOT DETECTED NOT DETECTED Final   Candida albicans NOT DETECTED NOT DETECTED Final   Candida glabrata NOT DETECTED NOT DETECTED Final   Candida krusei NOT DETECTED NOT DETECTED Final   Candida parapsilosis NOT DETECTED NOT DETECTED Final   Candida tropicalis NOT DETECTED NOT DETECTED Final    Comment: Performed at [facility], [address] [place], [place] [address]  SARS CORONAVIRUS 2 (TAT 6-24 HRS) Nasopharyngeal Nasopharyngeal Swab     Status: None   Collection Time: 07/14/19  2:27 PM   Specimen: Nasopharyngeal Swab  Result Value Ref Range Status   SARS Coronavirus 2 NEGATIVE NEGATIVE Final    Comment: (NOTE) SARS-CoV-2 target nucleic acids are NOT DETECTED. The SARS-CoV-2 RNA is generally detectable  in upper and lower respiratory specimens during the acute phase of infection. Negative results do not preclude SARS-CoV-2 infection, do not rule out co-infections with other pathogens, and should not be used as the sole basis for treatment or other patient management decisions. Negative results must be combined with clinical observations, patient history, and epidemiological information. The expected result is Negative. Fact Sheet for Patients: SugarRoll.be Fact Sheet for Healthcare Providers: https://www.woods-mathews.com/ This test is not yet approved or cleared by the [place] FDA and  has been authorized for detection and/or diagnosis of SARS-CoV-2 by FDA under an Emergency Use Authorization (EUA). This EUA will remain  in effect (meaning this test can be used) for the duration of the COVID-19 declaration under Section 56 4(b)(1) of the Act, 21 U.S.C. section 360bbb-3(b)(1), unless the authorization is terminated or revoked sooner. Performed at [facility], [place] [address] [address], [place], [place] [address]   Culture, blood (routine x 2)     Status: Abnormal   Collection Time: 07/14/19  9:58 PM   Specimen: BLOOD RIGHT HAND  Result Value Ref Range Status   Specimen Description BLOOD RIGHT HAND  Final   Special Requests   Final    BOTTLES DRAWN AEROBIC AND ANAEROBIC Blood Culture adequate volume   Culture  Setup Time   Final    GRAM POSITIVE COCCI ANAEROBIC BOTTLE ONLY CRITICAL VALUE NOTED.  VALUE IS CONSISTENT WITH PREVIOUSLY REPORTED AND CALLED VALUE.    Culture (A)  Final    STAPHYLOCOCCUS AUREUS SUSCEPTIBILITIES PERFORMED ON PREVIOUS CULTURE WITHIN THE LAST 5 DAYS. Performed at [facility], [place] [address] [place], [place] [address]    Report Status 07/17/2019 FINAL  Final  Culture, blood (routine x 2)     Status: None   Collection Time: 07/16/19  3:57 AM   Specimen: BLOOD  Result Value Ref Range Status   Specimen  Description BLOOD LEFT ANTECUBITAL  Final   Special Requests   Final    BOTTLES DRAWN AEROBIC AND ANAEROBIC Blood Culture adequate volume   Culture   Final    NO GROWTH 5 DAYS Performed at [facility], [place] [address] [place], [place] [address]    Report Status 07/21/2019 FINAL  Final  Culture, blood (routine x 2)     Status: None   Collection Time: 07/16/19  3:58 AM   Specimen: BLOOD  Result Value Ref Range Status   Specimen Description BLOOD RIGHT ANTECUBITAL  Final   Special Requests   Final    BOTTLES DRAWN AEROBIC AND ANAEROBIC Blood Culture adequate volume   Culture   Final    NO GROWTH 5 DAYS Performed at [facility], [place] [address] [place], [place] [address]    Report Status 07/21/2019 FINAL  Final     Labs: CBC: Recent Labs  Lab 07/16/19 0358 07/17/19 0803 07/18/19 0705 07/19/19 0617 07/20/19 0540  WBC 18.8* 10.9* 9.4 10.2 11.4*  NEUTROABS 16.0* 7.5 6.6 7.0 8.0*  HGB 12.8* 12.9* 13.0 13.6 13.0  HCT 39.0 39.5 40.9 40.3 39.3  MCV 98.5 99.0 99.5 96.6 96.3  PLT 291 251 284  247 244   Basic Metabolic Panel: Recent Labs  Lab 07/17/19 0803 07/18/19 0705 07/19/19 0617 07/20/19 0540 07/21/19 0551  NA 138 139 137 137 137  K 4.7 3.6 3.3* 3.7 4.7  CL 99 101 98 100 102  CO2 _0 GLUCOSE 103* 85 95 104* 93  BUN _1 CREATININE 1.09 1.07 1.16 1.19 1.42*  CALCIUM 7.9* 8.0* 7.9* 8.1* 8.0*  MG 2.1  --   --   --   --    Liver Function Tests: No results for input(s): AST, ALT, ALKPHOS, BILITOT, PROT, ALBUMIN in the last 168 hours. BNP (last 3 results) Recent Labs    07/14/19 1441  BNP 586.2*   I discussed in detail with patient's daughter via phone, updated care and answered questions.  Also advised her regarding need for outpatient follow-up of the thyroid nodule.   Time coordinating discharge: 35 minutes  SIGNED:  Vernell Leep, MD, FACP, [facility]. [facility]  To contact the attending provider between 7A-7P or  the covering provider during after hours 7P-7A, please log into the web site [email or web address] and access using universal [place] password for that web site. If you do not have the password, please call the hospital operator.

## 2019-07-21 NOTE — Progress Notes (Signed)
Physical Therapy Treatment Patient Details Name: [name] MRN: [id number] DOB: 26-Aug-1932 Today's Date: 07/21/2019    History of Present Illness Patient is a 85 y/o male admitted with dyspnea, SpO2 70's in ED, and positive MSSA bacteremia.. Past medical history significant of seasonal allergies, basal cell carcinoma, hyperlipidemia, hypertension, osteoporosis and eczema.    PT Comments    Pt admitted with above diagnosis. Pt was able to ambulate with RW and initially min assist but progressed to mod assist as he fatigues.  Pt needs incr cues for safety as well.  DOE2-3/4.  Pt currently with functional limitations due to balance and endurance deficits. Pt will benefit from skilled PT to increase their independence and safety with mobility to allow discharge to the venue listed below.     Follow Up Recommendations  Home health PT;Supervision/Assistance - 24 hour     Equipment Recommendations  None recommended by PT    Recommendations for Other Services       Precautions / Restrictions Precautions Precautions: Fall Precaution Comments: monitor O2 Sats Restrictions Weight Bearing Restrictions: No    Mobility  Bed Mobility Overal bed mobility: Needs Assistance Bed Mobility: Supine to Sit     Supine to sit: Min assist;HOB elevated;Min guard     General bed mobility comments: Needed assist for elevation of trunk.   Transfers Overall transfer level: Needs assistance Equipment used: Rolling walker (2 wheeled) Transfers: Sit to/from Stand Sit to Stand: Min assist         General transfer comment: MIN A to power up; cues for hand placement initially; able to carryover "reaching back" technique by end of session  Ambulation/Gait Ambulation/Gait assistance: Mod assist;Min assist Gait Distance (Feet): 170 Feet Assistive device: Rolling walker (2 wheeled) Gait Pattern/deviations: Step-through pattern;Decreased stride length;Staggering left;Staggering right;Trunk  flexed;Leaning posteriorly Gait velocity: slower Gait velocity interpretation: <1.31 ft/sec, indicative of household ambulator General Gait Details: Pt initially needing assist and cues to guide RW but was just needed slight steadying assist however balance worsening  with fatigue to the point of needing mod assist the last 30 feet.  Walking behind the RW at times, cues for posture and more assist with fatigue.  Sats maintained at 97% at different check points, EHR 80's nad 90's, but with increased dyspnea and fatigue.   Stairs             Wheelchair Mobility    Modified Rankin (Stroke Patients Only)       Balance Overall balance assessment: Needs assistance Sitting-balance support: Feet supported Sitting balance-Leahy Scale: Fair Sitting balance - Comments: able to static sit with close min guard   Standing balance support: During functional activity;Bilateral upper extremity supported Standing balance-Leahy Scale: Poor Standing balance comment: Requires UE support and external support                            Cognition Arousal/Alertness: Awake/alert Behavior During Therapy: WFL for tasks assessed/performed Overall Cognitive Status: Within Functional Limits for tasks assessed                                 General Comments: pleasant and attentive throughout session      Exercises General Exercises - Lower Extremity Long Arc Quad: AROM;Both;10 reps;Seated    General Comments General comments (skin integrity, edema, etc.): thorough education on fall prevention at home; issused handouts; pt states caregiver is very good  and doesn't let him move alone, also states that when he falls he "crashes"      Pertinent Vitals/Pain Pain Assessment: No/denies pain    Home Living                      Prior Function            PT Goals (current goals can now be found in the care plan section) Acute Rehab PT Goals Patient Stated Goal: To  go home Progress towards PT goals: Progressing toward goals    Frequency    Min 3X/week      PT Plan Current plan remains appropriate    Co-evaluation              AM-PAC PT "6 Clicks" Mobility   Outcome Measure  Help needed turning from your back to your side while in a flat bed without using bedrails?: None Help needed moving from lying on your back to sitting on the side of a flat bed without using bedrails?: None Help needed moving to and from a bed to a chair (including a wheelchair)?: A Little Help needed standing up from a chair using your arms (e.g., wheelchair or bedside chair)?: A Little Help needed to walk in hospital room?: A Lot Help needed climbing 3-5 steps with a railing? : A Lot 6 Click Score: 18    End of Session Equipment Utilized During Treatment: Gait belt Activity Tolerance: Patient tolerated treatment well Patient left: in chair;with chair alarm set;with call bell/phone within reach Nurse Communication: Mobility status PT Visit Diagnosis: Other abnormalities of gait and mobility (R26.89);Muscle weakness (generalized) (M62.81);History of falling (Z91.81)     Time: 1012-1028 PT Time Calculation (min) (ACUTE ONLY): 16 min  Charges:  $Gait Training: 8-22 mins                     [place] Pager:  [phone]  Office:  [place] 07/21/2019, 12:42 PM

## 2019-07-21 NOTE — Care Management Important Message (Signed)
Important Message  Patient Details  Name: [name] MRN: [id number] Date of Birth: Oct 07, 1932   Medicare Important Message Given:  Yes     [name] 07/21/2019, 2:04 PM

## 2019-07-21 NOTE — Progress Notes (Signed)
SATURATION QUALIFICATIONS: (This note is used to comply with regulatory documentation for home oxygen)  Patient Saturations on Room Air at Rest = 99%  Patient Saturations on Room Air while Ambulating = 97%  Patient was able to ambulate from the sink back to the recliner with saturations remaining in the mid 90s.

## 2019-07-21 NOTE — Discharge Instructions (Signed)

## 2019-07-21 NOTE — Progress Notes (Signed)
Occupational Therapy Treatment Patient Details Name: [name] MRN: [id number] DOB: Jun 18, 1932 Today's Date: 07/21/2019    History of present illness Patient is a 85 y/o male admitted with dyspnea, SpO2 70's in ED, and positive MSSA bacteremia.. Past medical history significant of seasonal allergies, basal cell carcinoma, hyperlipidemia, hypertension, osteoporosis and eczema.   OT comments  Pt making steady progress towards OT goals this session. Session focus on functional mobility and ADLs. Pt MIN A for sit>stand from recliner with RW. MIN guard for functional mobility recliner>sink with RW. Pt stood at sink ~ 4 minutes for standing grooming. Checked sats at 5 min mark with O2 sats remaining in the 90s. Issued fall prevention handouts and provided education on fall prevention strategies to carryover at home. Pt verbalize understanding. Pt likely to DC home today; DC plan remains appropriate. Will continue to follow for acute OT needs.    Follow Up Recommendations  No OT follow up;Supervision/Assistance - 24 hour    Equipment Recommendations  None recommended by OT    Recommendations for Other Services      Precautions / Restrictions Precautions Precautions: Fall Precaution Comments: monitor O2 Sats Restrictions Weight Bearing Restrictions: No       Mobility Bed Mobility               General bed mobility comments: up in recliner  Transfers Overall transfer level: Needs assistance Equipment used: Rolling walker (2 wheeled) Transfers: Sit to/from Stand Sit to Stand: Min assist         General transfer comment: MIN A to power up; cues for hand placement initially; able to carryover "reaching back" technique by end of session    Balance Overall balance assessment: Needs assistance Sitting-balance support: Feet supported Sitting balance-Leahy Scale: Fair Sitting balance - Comments: able to static sit with close min guard   Standing balance support: During  functional activity;Single extremity supported Standing balance-Leahy Scale: Poor Standing balance comment: UE support for balance during standing grooming at sink; cues to widen BOS to maintain standing balance                           ADL either performed or assessed with clinical judgement   ADL Overall ADL's : Needs assistance/impaired     Grooming: Oral care;Wash/dry hands;Standing;Supervision/safety;Cueing for safety;Minimal assistance Grooming Details (indicate cue type and reason): supervision for standing grooming at sink with RW; cues for safety to stay close sink/ RW;MIN A to open packets and caps                 Toilet Transfer: Min Marine scientist Details (indicate cue type and reason): MIN G for functional mobility for simulated toilet transfer with RW         Functional mobility during ADLs: Min guard;Rolling walker General ADL Comments: MINA to open caps for stand grooming at sink; MIN G for safety for standing ADLs; education provided for fall prevention; issused handout; discussed ADL routine at home; able to stand at sink with RW ~ 4 minutes with sats staying above 90 on RA; pt requesting assist to open packets and caps for oral care     Vision Patient Visual Report: No change from baseline     Perception     Praxis      Cognition Arousal/Alertness: Awake/alert Behavior During Therapy: WFL for tasks assessed/performed Overall Cognitive Status: Within Functional Limits for tasks assessed  General Comments: pleasant and attentive throughout session        Exercises     Shoulder Instructions       General Comments thorough education on fall prevention at home; issused handouts; pt states caregiver is very good and doesn't let him move alone, also states that when he falls he "crashes"    Pertinent Vitals/ Pain       Pain Assessment: No/denies pain  Home Living                                           Prior Functioning/Environment              Frequency  Min 2X/week        Progress Toward Goals  OT Goals(current goals can now be found in the care plan section)  Progress towards OT goals: Progressing toward goals  Acute Rehab OT Goals Patient Stated Goal: To go home OT Goal Formulation: With patient Time For Goal Achievement: 08/01/19 Potential to Achieve Goals: Good  Plan Discharge plan remains appropriate    Co-evaluation                 AM-PAC OT "6 Clicks" Daily Activity     Outcome Measure   Help from another person eating meals?: None Help from another person taking care of personal grooming?: A Little Help from another person toileting, which includes using toliet, bedpan, or urinal?: A Little Help from another person bathing (including washing, rinsing, drying)?: A Lot Help from another person to put on and taking off regular upper body clothing?: A Little Help from another person to put on and taking off regular lower body clothing?: A Lot 6 Click Score: 17    End of Session Equipment Utilized During Treatment: Gait belt;Rolling walker  OT Visit Diagnosis: Unsteadiness on feet (R26.81);Muscle weakness (generalized) (M62.81)   Activity Tolerance Patient tolerated treatment well   Patient Left in chair;with call bell/phone within reach   Nurse Communication Mobility status        Time: [id number] OT Time Calculation (min): 28 min  Charges: OT General Charges $OT Visit: 1 Visit OT Treatments $Self Care/Home Management : 23-37 mins  [place], [place] [phone] [place] 07/21/2019, 11:52 AM

## 2019-07-23 ENCOUNTER — Telehealth: Payer: Self-pay | Admitting: Family Medicine

## 2019-07-23 DIAGNOSIS — J4520 Mild intermittent asthma, uncomplicated: Secondary | ICD-10-CM | POA: Diagnosis not present

## 2019-07-23 DIAGNOSIS — M810 Age-related osteoporosis without current pathological fracture: Secondary | ICD-10-CM | POA: Diagnosis not present

## 2019-07-23 DIAGNOSIS — I5032 Chronic diastolic (congestive) heart failure: Secondary | ICD-10-CM | POA: Diagnosis not present

## 2019-07-23 DIAGNOSIS — Z8673 Personal history of transient ischemic attack (TIA), and cerebral infarction without residual deficits: Secondary | ICD-10-CM | POA: Diagnosis not present

## 2019-07-23 DIAGNOSIS — N189 Chronic kidney disease, unspecified: Secondary | ICD-10-CM | POA: Diagnosis not present

## 2019-07-23 DIAGNOSIS — R7881 Bacteremia: Secondary | ICD-10-CM | POA: Diagnosis not present

## 2019-07-23 DIAGNOSIS — E041 Nontoxic single thyroid nodule: Secondary | ICD-10-CM | POA: Diagnosis not present

## 2019-07-23 DIAGNOSIS — Z85828 Personal history of other malignant neoplasm of skin: Secondary | ICD-10-CM | POA: Diagnosis not present

## 2019-07-23 DIAGNOSIS — I130 Hypertensive heart and chronic kidney disease with heart failure and stage 1 through stage 4 chronic kidney disease, or unspecified chronic kidney disease: Secondary | ICD-10-CM | POA: Diagnosis not present

## 2019-07-23 DIAGNOSIS — B9561 Methicillin susceptible Staphylococcus aureus infection as the cause of diseases classified elsewhere: Secondary | ICD-10-CM | POA: Diagnosis not present

## 2019-07-23 DIAGNOSIS — Z9981 Dependence on supplemental oxygen: Secondary | ICD-10-CM | POA: Diagnosis not present

## 2019-07-23 DIAGNOSIS — L209 Atopic dermatitis, unspecified: Secondary | ICD-10-CM | POA: Diagnosis not present

## 2019-07-23 DIAGNOSIS — Z8781 Personal history of (healed) traumatic fracture: Secondary | ICD-10-CM | POA: Diagnosis not present

## 2019-07-23 NOTE — Telephone Encounter (Signed)
Home Health Verbal Orders - Caller/Agency: Betsy/ [facility] Callback Number: [phone] Secure line Requesting OT/PT/Skilled Nursing/Social Work/Speech Therapy: PT and nursing evaluation Frequency: 2x for 4wks, 1x for 4wks.

## 2019-07-26 DIAGNOSIS — N189 Chronic kidney disease, unspecified: Secondary | ICD-10-CM | POA: Diagnosis not present

## 2019-07-26 DIAGNOSIS — R7881 Bacteremia: Secondary | ICD-10-CM | POA: Diagnosis not present

## 2019-07-26 DIAGNOSIS — B9561 Methicillin susceptible Staphylococcus aureus infection as the cause of diseases classified elsewhere: Secondary | ICD-10-CM | POA: Diagnosis not present

## 2019-07-26 DIAGNOSIS — I5032 Chronic diastolic (congestive) heart failure: Secondary | ICD-10-CM | POA: Diagnosis not present

## 2019-07-26 DIAGNOSIS — I130 Hypertensive heart and chronic kidney disease with heart failure and stage 1 through stage 4 chronic kidney disease, or unspecified chronic kidney disease: Secondary | ICD-10-CM | POA: Diagnosis not present

## 2019-07-26 DIAGNOSIS — L209 Atopic dermatitis, unspecified: Secondary | ICD-10-CM | POA: Diagnosis not present

## 2019-07-26 NOTE — Telephone Encounter (Signed)
Betsy called in again to f/u on orders for pt and nursing. Please assist. cb is [phone]

## 2019-07-27 NOTE — Telephone Encounter (Signed)
Verbal given 

## 2019-07-27 NOTE — Telephone Encounter (Signed)
Third attempt. Betsy called in to f/u on verbal orders to pt and nursing.

## 2019-07-28 DIAGNOSIS — R7881 Bacteremia: Secondary | ICD-10-CM | POA: Diagnosis not present

## 2019-07-28 DIAGNOSIS — B9561 Methicillin susceptible Staphylococcus aureus infection as the cause of diseases classified elsewhere: Secondary | ICD-10-CM | POA: Diagnosis not present

## 2019-07-28 DIAGNOSIS — N189 Chronic kidney disease, unspecified: Secondary | ICD-10-CM | POA: Diagnosis not present

## 2019-07-28 DIAGNOSIS — I130 Hypertensive heart and chronic kidney disease with heart failure and stage 1 through stage 4 chronic kidney disease, or unspecified chronic kidney disease: Secondary | ICD-10-CM | POA: Diagnosis not present

## 2019-07-28 DIAGNOSIS — I5032 Chronic diastolic (congestive) heart failure: Secondary | ICD-10-CM | POA: Diagnosis not present

## 2019-07-28 DIAGNOSIS — L209 Atopic dermatitis, unspecified: Secondary | ICD-10-CM | POA: Diagnosis not present

## 2019-07-28 NOTE — Telephone Encounter (Signed)
error 

## 2019-07-30 ENCOUNTER — Other Ambulatory Visit: Payer: Self-pay

## 2019-07-30 ENCOUNTER — Ambulatory Visit (INDEPENDENT_AMBULATORY_CARE_PROVIDER_SITE_OTHER): Payer: Medicare Other | Admitting: Family Medicine

## 2019-07-30 ENCOUNTER — Ambulatory Visit (INDEPENDENT_AMBULATORY_CARE_PROVIDER_SITE_OTHER): Payer: Medicare Other

## 2019-07-30 ENCOUNTER — Encounter: Payer: Self-pay | Admitting: Family Medicine

## 2019-07-30 VITALS — BP 147/83 | HR 90 | Temp 98.6°F | Ht 70.0 in | Wt 151.4 lb

## 2019-07-30 DIAGNOSIS — R7881 Bacteremia: Secondary | ICD-10-CM

## 2019-07-30 DIAGNOSIS — E041 Nontoxic single thyroid nodule: Secondary | ICD-10-CM | POA: Diagnosis not present

## 2019-07-30 DIAGNOSIS — R062 Wheezing: Secondary | ICD-10-CM | POA: Diagnosis not present

## 2019-07-30 DIAGNOSIS — R0602 Shortness of breath: Secondary | ICD-10-CM | POA: Diagnosis not present

## 2019-07-30 DIAGNOSIS — N179 Acute kidney failure, unspecified: Secondary | ICD-10-CM

## 2019-07-30 LAB — POCT CBC
Granulocyte percent: 82.2 %G — AB (ref 37–80)
HCT, POC: 37.7 % (ref 29–41)
Hemoglobin: 12.4 g/dL (ref 11–14.6)
Lymph, poc: 1.5 (ref 0.6–3.4)
MCH, POC: 31.6 pg — AB (ref 27–31.2)
MCHC: 32.8 g/dL (ref 31.8–35.4)
MCV: 96.3 fL (ref 76–111)
MID (cbc): 0.8 (ref 0–0.9)
MPV: 6.6 fL (ref 0–99.8)
POC Granulocyte: 10.8 — AB (ref 2–6.9)
POC LYMPH PERCENT: 11.6 %L (ref 10–50)
POC MID %: 6.2 %M (ref 0–12)
Platelet Count, POC: 181 10*3/uL (ref 142–424)
RBC: 3.92 M/uL — AB (ref 4.69–6.13)
RDW, POC: 12.9 %
WBC: 13.1 10*3/uL — AB (ref 4.6–10.2)

## 2019-07-30 MED ORDER — ALBUTEROL SULFATE HFA 108 (90 BASE) MCG/ACT IN AERS: 1.0000 | INHALATION_SPRAY | Freq: Four times a day (QID) | RESPIRATORY_TRACT | 3 refills | Status: DC | PRN

## 2019-07-30 MED ORDER — OMEPRAZOLE 20 MG PO CPDR: 20.0000 mg | DELAYED_RELEASE_CAPSULE | Freq: Two times a day (BID) | ORAL | 3 refills | Status: DC

## 2019-07-30 NOTE — Progress Notes (Signed)
10/2/20204:04 PM  [name] 05-08-1932, 85 y.o., male [id number]  Chief Complaint  Patient presents with  . Follow-up    release from the hosp on Thurs, 2 days left with antibiotics. Still sob    HPI:   Patient is a 85 y.o. male with past medical history significant for HTN, asthma, BPH, CKD, HLP, CVA, osteoporosiswho presents today for hosp followup  Admitted for MSSA bactermia, acute resp hypoxia, edema, thyroid nodule from 9/16-23 Treated with IV steroids for asthma/exczema ID did not think pneumonia D/c linzeloid and lasix 14m, has 2 more days Main concern continues to be SOB and wheezing, no fever or chills Reports edema is much improved, wakes up with normal legs but they do become edematous as the day progresses. Spends most of his day sitting. Uses ICS bid and albuterol prn reports albuterol helps but makes him jittery Did not use to have issues with proair, but does have issues with proventil Uses oxygen 2L at night Appetite slowly getting better Large HH, coughing and choking with fluids and solids Requesting referral to endo for incidental finding of complex thyroid cyst Normal TSH  CTA done during hosp: IMPRESSION: 1. Technically adequate exam showing no acute pulmonary embolus. 2. Marked dilatation of the LEFT atrium. There is high attenuation material in the region of the mitral annulus, consistent prior repair or extensive calcification. 3. Bilateral pleural effusions, pulmonary venous congestion, and parenchymal changes of pulmonary edema. 4. RIGHT LOWER lobe consolidation or atelectasis. LEFT LOWER lobe atelectasis. 5. coronary artery disease. 6. Large hiatal hernia. Diffuse esophageal wall thickening, raising the question of esophagitis. 7. LEFT thyroid mass measuring 5.7 cm. Recommend further evaluation with thyroid ultrasound. 8. Remote T5 wedge compression fracture. 9. Aortic Atherosclerosis (ICD10-I70.0).  TTE done during hosp: 1. Left  ventricular ejection fraction, by visual estimation, is 55 to 60%. The left ventricle has normal function. There is no left ventricular hypertrophy. 2. Global right ventricle has normal systolic function.The right ventricular size is normal. No increase in right ventricular wall thickness. 3. Left atrial size was normal. 4. Right atrial size was normal. 5. The mitral valve is normal in structure. Mild mitral valve regurgitation. 6. The tricuspid valve is normal in structure. Tricuspid valve regurgitation is mild. 7. The aortic valve is tricuspid Aortic valve regurgitation was not visualized by color flow Doppler. Mild to moderate aortic valve sclerosis/calcification without any evidence of aortic stenosis. 8. The pulmonic valve was normal in structure. Pulmonic valve regurgitation is not visualized by color flow Doppler. 9. Minimal plaque invoving the ascending, transverse and descending aorta. 10. Despite suboptimal images (hiatal hernia), no vegetations detected.  Blood Culture    Component Value Date/Time   SDES BLOOD RIGHT ANTECUBITAL 07/16/2019 0358   SPECREQUEST  07/16/2019 0358    BOTTLES DRAWN AEROBIC AND ANAEROBIC Blood Culture adequate volume   CULT  07/16/2019 0358    NO GROWTH 5 DAYS [facility]formed at [facility] [address], [place] [address]   REPTSTATUS 07/21/2019 FINAL 07/16/2019 0358    Dep[facility] screen [facility]2/9 07/30/2019 05/07/2019 04/27/2019  Decreased Interest 0 0 0  Down, Depressed, Hopeless 0 0 0  PHQ - 2 Score 0 0 0  Altered sleeping - - -  Tired, decreased energy - - -  Change in appetite - - -  Feeling bad or failure about yourself  - - -  Trouble concentrating - - -  Moving slowly or fidgety/restless - - -  Suicidal thoughts - - -  PHQ-9 Score - - -  Difficult doing work/chores - - -    Fall Risk  07/30/2019 05/07/2019 04/27/2019 04/01/2019 03/23/2019  Falls in the past year? 1 0 '1 1 1  ' Comment - - - - -  Number falls in past yr: 1 0 1  0 0  Injury with Fall? 1 0 1 1 0  Comment - - bruising due to altered mental status, pt fine 3 weeks ago per daughter - -  Risk Factor Category  - - - - -  Risk for fall due to : - - - - -  Follow up - - Falls evaluation completed Falls evaluation completed -     Allergies  Allergen Reactions  . Other Shortness Of Breath, Itching and Other (See Comments)    Seasonal allergies- Runny nose, congestion, itchy eyes, and wheezing    Prior to Admission medications   Medication Sig Start Date End Date Taking? Authorizing Provider  acetaminophen (TYLENOL) 500 MG tablet Take 500-1,000 mg by mouth as needed for mild pain or headache (pain from eczema).    Yes [provider]  albuterol (PROVENTIL HFA;VENTOLIN HFA) 108 (90 Base) MCG/ACT inhaler Inhale 2 puffs into the lungs every 6 (six) hours as needed for wheezing or shortness of breath. 09/29/18  Yes Forrest Moron, MD  alendronate (FOSAMAX) 70 MG tablet Take 1 tablet (70 mg total) by mouth every 7 (seven) days. Take with a full glass of water on an empty stomach. Patient taking differently: Take 70 mg by mouth every Saturday. Take with a full glass of water on an empty stomach 07/27/18  Yes Rutherford Guys, MD  beclomethasone (QVAR REDIHALER) 40 MCG/ACT inhaler Inhale 1 puff into the lungs 2 (two) times daily. 05/07/19  Yes Rutherford Guys, MD  Blood Pressure KIT Please check BP twice a day 03/25/19  Yes Rutherford Guys, MD  clopidogrel (PLAVIX) 75 MG tablet TAKE 1 TABLET ONCE DAILY. Patient taking differently: Take 75 mg by mouth daily.  06/07/19  Yes Rutherford Guys, MD  docusate sodium (COLACE) 100 MG capsule Take 1 capsule (100 mg total) by mouth daily as needed for mild constipation. 07/21/19  Yes Hongalgi, Lenis Dickinson, MD  furosemide (LASIX) 40 MG tablet Take 0.5 tablets (20 mg total) by mouth daily. 07/22/19  Yes Hongalgi, Lenis Dickinson, MD  linezolid (ZYVOX) 600 MG tablet Take 1 tablet (600 mg total) by mouth 2 (two) times daily for 10 days.  07/20/19 07/30/19 Yes Golden Circle, FNP  metoprolol succinate (TOPROL-XL) 50 MG 24 hr tablet TAKE 1 TABLET DAILY WITH OR IMMEDIATELY FOLLOWING A MEAL. Patient taking differently: Take 50 mg by mouth daily.  05/05/19  Yes Rutherford Guys, MD  Misc. Devices [facility]) MISC Use walker whenever walking, standard walker 07/27/18  Yes Rutherford Guys, MD  pravastatin (PRAVACHOL) 40 MG tablet TAKE 1 TABLET ONCE DAILY. Patient taking differently: Take 40 mg by mouth daily.  05/05/19  Yes Rutherford Guys, MD  traZODone (DESYREL) 50 MG tablet Take 1 tablet (50 mg total) by mouth at bedtime. 05/07/19  Yes Rutherford Guys, MD    Past Medical History:  Diagnosis Date  . Allergy    seasonal  fall  . Asthma    as child  . Cancer ([place])    Basal cell carcinoma; multiple  . Dyspnea   . Eczema   . Hyperlipidemia   . Hypertension   . Osteoporosis   . Stroke [facility])  Past Surgical History:  Procedure Laterality Date  . CATARACT EXTRACTION, BILATERAL  06/29/2015  . INTRAMEDULLARY (IM) NAIL INTERTROCHANTERIC Left 03/30/2017   Procedure: INTRAMEDULLARY (IM) NAIL INTERTROCHANTRIC;  Surgeon: [name], MD;  Location: [place];  Service: Orthopedics;  Laterality: Left;  . INTRAMEDULLARY (IM) NAIL INTERTROCHANTERIC Right 11/12/2018   Procedure: INTRAMEDULLARY (IM) NAIL RIGHT INTERTROCHANTERIC HIP FRACTURE;  Surgeon: [name], MD;  Location: [place];  Service: Orthopedics;  Laterality: Right;  . MOHS SURGERY    . TEE WITHOUT CARDIOVERSION N/A 07/20/2019   Procedure: TRANSESOPHAGEAL ECHOCARDIOGRAM (TEE);  Surgeon: [name], MD;  [facility]COPY;  Service: Cardiovascular;  Laterality: N/A;  . TONSILLECTOMY  1940 maybe    Social History   Tobacco Use  . Smoking status: Never Smoker  . Smokeless tobacco: Never Used  Substance Use Topics  . Alcohol use: No    Comment: occas    Family History  Problem Relation Age of Onset  . Arthritis Mother   . Heart disease Mother 81  .  Hypertension Father     Review of Systems  Constitutional: Negative for chills and fever.  Respiratory: Positive for cough (only when eating), shortness of breath and wheezing. Negative for sputum production.   Cardiovascular: Positive for leg swelling. Negative for chest pain and palpitations.  Gastrointestinal: Negative for abdominal pain, nausea and vomiting.     OBJECTIVE:  Today's Vitals   07/30/19 1551  BP: (!) 147/83  Pulse: 90  Temp: 98.6 F (37 C)  SpO2: 100%  Weight: 151 lb 6.4 oz (68.7 kg)  Height: '5\' 10"'  (1.778 m)   Body mass index is 21.72 kg/m.  Wt Readings from Last 3 Encounters:  07/30/19 151 lb 6.4 oz (68.7 kg)  07/15/19 158 lb 8.2 oz (71.9 kg)  05/12/19 150 lb (68 kg)    Physical Exam Vitals signs and nursing note reviewed.  Constitutional:      Appearance: He is well-developed.  HENT:     Head: Normocephalic and atraumatic.  Eyes:     Conjunctiva/sclera: Conjunctivae normal.     Pupils: Pupils are equal, round, and reactive to light.  Neck:     Musculoskeletal: Neck supple.  Cardiovascular:     Rate and Rhythm: Normal rate and regular rhythm.     Heart sounds: No murmur. No friction rub. No gallop.   Pulmonary:     Effort: Pulmonary effort is normal.     Breath sounds: Rales (bibasilar) present. No wheezing or rhonchi.  Musculoskeletal:     Right lower leg: Edema (pitting +1) present.     Left lower leg: Edema present.  Skin:    General: Skin is warm and dry.  Neurological:     Mental Status: He is alert and oriented to person, place, and time.     Results for orders placed or performed in visit on 07/30/19 (from the past 24 hour(s))  POCT CBC     Status: Abnormal   Collection Time: 07/30/19  4:19 PM  Result Value Ref Range   WBC 13.1 (A) 4.6 - 10.2 K/uL   Lymph, poc 1.5 0.6 - 3.4   POC LYMPH PERCENT 11.6 10 - 50 %L   MID (cbc) 0.8 0 - 0.9   POC MID % 6.2 0 - 12 %M   POC Granulocyte 10.8 (A) 2 - 6.9   Granulocyte percent 82.2 (A)  37 - 80 %G   RBC 3.92 (A) 4.69 - 6.13 M/uL   Hemoglobin 12.4 11 -  14.6 g/dL   HCT, POC 37.7 29 - 41 %   MCV 96.3 76 - 111 fL   MCH, POC 31.6 (A) 27 - 31.2 pg   MCHC 32.8 31.8 - 35.4 g/dL   RDW, POC 12.9 %   Platelet Count, POC 181 142 - 424 K/uL   MPV 6.6 0 - 99.8 fL   CBC Latest Ref Rng & Units 07/30/2019 07/20/2019 07/19/2019  WBC 4.6 - 10.2 K/uL 13.1(A) 11.4(H) 10.2  Hemoglobin 11 - 14.6 g/dL 12.4 13.0 13.6  Hematocrit 29 - 41 % 37.7 39.3 40.3  Platelets 150 - 400 K/uL - 266 247    Dg Chest 2 View  Result Date: 07/30/2019 CLINICAL DATA:  Shortness of breath, recent bacteremia EXAM: CHEST - 2 VIEW COMPARISON:  07/14/2019 FINDINGS: Stable cardiomediastinal contours. Persistent elevation of the right hemidiaphragm. Small bilateral pleural effusions with associated bibasilar opacities. No pneumothorax. Similar advanced degenerative changes of the shoulders. IMPRESSION: Persistent small bilateral pleural effusions with associated bibasilar opacities which may reflect atelectasis and/or pneumonia. Electronically Signed   By: Davina Poke M.D.   On: 07/30/2019 17:00     ASSESSMENT and PLAN  1. Bacteremia Patient is feeling well. VSS. Repeat blood cx negative. Mild elevation in WBC stable and most likely from recent steroids. Complete abx course. RTC precautions reviewed. - POCT CBC - DG Chest 2 View; Future  2. Wheezing More than 50% of this 40 min visit was spent on education and coordination of care. Discussed concerns for GERD/aspirations as cause of wheezing. Adding PPI. Referral to speech for swallow eval. Referring to pulm to eval if asthma present/contributing. Changing to proventil to proair as better tolerated re jittery.  - Ambulatory referral to Speech Therapy - DG Chest 2 View; Future - unchanged - Ambulatory referral to Pulmonology  3. AKI (acute kidney injury) ([place]) - Basic Metabolic Panel  4. Cystic thyroid nodule - Ambulatory referral to ENT  Other orders -  omeprazole (PRILOSEC) 20 MG capsule; Take 1 capsule (20 mg total) by mouth 2 (two) times daily before a meal. - albuterol (PROAIR HFA) 108 (90 Base) MCG/ACT inhaler; Inhale 1-2 puffs into the lungs every 6 (six) hours as needed for wheezing or shortness of breath.   Return in about 4 weeks (around 08/27/2019).    Rutherford Guys, MD Primary Care at [place], [place] [address] Ph.  [phone] Fax [phone]

## 2019-07-30 NOTE — Patient Instructions (Signed)
° ° ° °  If you have lab work done today you will be contacted with your lab results within the next 2 weeks.  If you have not heard from us then please contact us. The fastest way to get your results is to register for My Chart. ° ° °IF you received an x-ray today, you will receive an invoice from [facility]. Please contact [facility] at [phone] with questions or concerns regarding your invoice.  ° °IF you received labwork today, you will receive an invoice from [facility]. Please contact [facility] at [phone] with questions or concerns regarding your invoice.  ° °Our billing staff will not be able to assist you with questions regarding bills from these companies. ° °You will be contacted with the lab results as soon as they are available. The fastest way to get your results is to activate your My Chart account. Instructions are located on the last page of this paperwork. If you have not heard from us regarding the results in 2 weeks, please contact this office. °  ° ° ° °

## 2019-07-31 LAB — BASIC METABOLIC PANEL
BUN/Creatinine Ratio: 11 (ref 10–24)
BUN: 12 mg/dL (ref 8–27)
CO2: 21 mmol/L (ref 20–29)
Calcium: 8.8 mg/dL (ref 8.6–10.2)
Chloride: 98 mmol/L (ref 96–106)
Creatinine, Ser: 1.05 mg/dL (ref 0.76–1.27)
GFR calc Af Amer: 74 mL/min/{1.73_m2} (ref >59)
GFR calc non Af Amer: 64 mL/min/{1.73_m2} (ref >59)
Glucose: 121 mg/dL — ABNORMAL HIGH (ref 65–99)
Potassium: 4.2 mmol/L (ref 3.5–5.2)
Sodium: 139 mmol/L (ref 134–144)

## 2019-08-02 DIAGNOSIS — L209 Atopic dermatitis, unspecified: Secondary | ICD-10-CM | POA: Diagnosis not present

## 2019-08-02 DIAGNOSIS — R7881 Bacteremia: Secondary | ICD-10-CM | POA: Diagnosis not present

## 2019-08-02 DIAGNOSIS — I5032 Chronic diastolic (congestive) heart failure: Secondary | ICD-10-CM | POA: Diagnosis not present

## 2019-08-02 DIAGNOSIS — B9561 Methicillin susceptible Staphylococcus aureus infection as the cause of diseases classified elsewhere: Secondary | ICD-10-CM | POA: Diagnosis not present

## 2019-08-02 DIAGNOSIS — I130 Hypertensive heart and chronic kidney disease with heart failure and stage 1 through stage 4 chronic kidney disease, or unspecified chronic kidney disease: Secondary | ICD-10-CM | POA: Diagnosis not present

## 2019-08-02 DIAGNOSIS — N189 Chronic kidney disease, unspecified: Secondary | ICD-10-CM | POA: Diagnosis not present

## 2019-08-04 DIAGNOSIS — N189 Chronic kidney disease, unspecified: Secondary | ICD-10-CM | POA: Diagnosis not present

## 2019-08-04 DIAGNOSIS — I130 Hypertensive heart and chronic kidney disease with heart failure and stage 1 through stage 4 chronic kidney disease, or unspecified chronic kidney disease: Secondary | ICD-10-CM | POA: Diagnosis not present

## 2019-08-04 DIAGNOSIS — B9561 Methicillin susceptible Staphylococcus aureus infection as the cause of diseases classified elsewhere: Secondary | ICD-10-CM | POA: Diagnosis not present

## 2019-08-04 DIAGNOSIS — L209 Atopic dermatitis, unspecified: Secondary | ICD-10-CM | POA: Diagnosis not present

## 2019-08-04 DIAGNOSIS — I5032 Chronic diastolic (congestive) heart failure: Secondary | ICD-10-CM | POA: Diagnosis not present

## 2019-08-04 DIAGNOSIS — R7881 Bacteremia: Secondary | ICD-10-CM | POA: Diagnosis not present

## 2019-08-05 ENCOUNTER — Telehealth: Payer: Self-pay | Admitting: Family Medicine

## 2019-08-05 NOTE — Telephone Encounter (Signed)
Please let family know that he is to continue using the albuterol until seen by pulmonology. thanks

## 2019-08-05 NOTE — Telephone Encounter (Signed)
Copied from [place] [id number]. Topic: General - Other >> Aug 04, 2019  4:52 PM Yvette Rack wrote: Reason for CRM: [facility] called stating pt family is concerned and would like to know if patient should continue taking the albuterol (PROAIR HFA) 108 (90 Base) MCG/ACT inhaler.

## 2019-08-05 NOTE — Telephone Encounter (Signed)
Please advise 

## 2019-08-06 ENCOUNTER — Telehealth: Payer: Self-pay | Admitting: Family Medicine

## 2019-08-06 ENCOUNTER — Ambulatory Visit: Payer: Medicare Other | Admitting: Family Medicine

## 2019-08-06 NOTE — Telephone Encounter (Signed)
pts daughter is wanting to up pts traZODone (DESYREL) 50 MG tablet  Pt is having trouble sleeping for about a week .   FR

## 2019-08-09 DIAGNOSIS — I5032 Chronic diastolic (congestive) heart failure: Secondary | ICD-10-CM | POA: Diagnosis not present

## 2019-08-09 DIAGNOSIS — B9561 Methicillin susceptible Staphylococcus aureus infection as the cause of diseases classified elsewhere: Secondary | ICD-10-CM | POA: Diagnosis not present

## 2019-08-09 DIAGNOSIS — R7881 Bacteremia: Secondary | ICD-10-CM | POA: Diagnosis not present

## 2019-08-09 DIAGNOSIS — L209 Atopic dermatitis, unspecified: Secondary | ICD-10-CM | POA: Diagnosis not present

## 2019-08-09 DIAGNOSIS — I130 Hypertensive heart and chronic kidney disease with heart failure and stage 1 through stage 4 chronic kidney disease, or unspecified chronic kidney disease: Secondary | ICD-10-CM | POA: Diagnosis not present

## 2019-08-09 DIAGNOSIS — N189 Chronic kidney disease, unspecified: Secondary | ICD-10-CM | POA: Diagnosis not present

## 2019-08-10 ENCOUNTER — Telehealth: Payer: Self-pay

## 2019-08-10 NOTE — Telephone Encounter (Signed)
Hello,   Would you please follow up on Mr. Bartholf referral to pulmonology?  I don't see that they have made any notes re an appointment.   Thank you,  Wilfred Curtis

## 2019-08-10 NOTE — Telephone Encounter (Signed)
[place] confirming if pt should have Proair inhaler.  Advised that per notes, pt was to stop Proventil and provider wants pt on Proair until he is seen by pulmonology.

## 2019-08-11 DIAGNOSIS — N189 Chronic kidney disease, unspecified: Secondary | ICD-10-CM | POA: Diagnosis not present

## 2019-08-11 DIAGNOSIS — L209 Atopic dermatitis, unspecified: Secondary | ICD-10-CM | POA: Diagnosis not present

## 2019-08-11 DIAGNOSIS — I5032 Chronic diastolic (congestive) heart failure: Secondary | ICD-10-CM | POA: Diagnosis not present

## 2019-08-11 DIAGNOSIS — R7881 Bacteremia: Secondary | ICD-10-CM | POA: Diagnosis not present

## 2019-08-11 DIAGNOSIS — B9561 Methicillin susceptible Staphylococcus aureus infection as the cause of diseases classified elsewhere: Secondary | ICD-10-CM | POA: Diagnosis not present

## 2019-08-11 DIAGNOSIS — I130 Hypertensive heart and chronic kidney disease with heart failure and stage 1 through stage 4 chronic kidney disease, or unspecified chronic kidney disease: Secondary | ICD-10-CM | POA: Diagnosis not present

## 2019-08-13 ENCOUNTER — Other Ambulatory Visit: Payer: Self-pay | Admitting: Family Medicine

## 2019-08-13 DIAGNOSIS — R000 Tachycardia, unspecified: Secondary | ICD-10-CM

## 2019-08-13 DIAGNOSIS — I10 Essential (primary) hypertension: Secondary | ICD-10-CM

## 2019-08-13 MED ORDER — TRAZODONE HCL 50 MG PO TABS: 50.0000 mg | ORAL_TABLET | Freq: Every day | ORAL | 1 refills | Status: DC

## 2019-08-13 NOTE — Telephone Encounter (Signed)
[name],   Mr. Ochocki family is reporting difficulty sleeping and have asked if his Trazodone can be increased.   Thanks,  [employer]

## 2019-08-16 DIAGNOSIS — L209 Atopic dermatitis, unspecified: Secondary | ICD-10-CM | POA: Diagnosis not present

## 2019-08-16 DIAGNOSIS — B9561 Methicillin susceptible Staphylococcus aureus infection as the cause of diseases classified elsewhere: Secondary | ICD-10-CM | POA: Diagnosis not present

## 2019-08-16 DIAGNOSIS — I5032 Chronic diastolic (congestive) heart failure: Secondary | ICD-10-CM | POA: Diagnosis not present

## 2019-08-16 DIAGNOSIS — N189 Chronic kidney disease, unspecified: Secondary | ICD-10-CM | POA: Diagnosis not present

## 2019-08-16 DIAGNOSIS — R7881 Bacteremia: Secondary | ICD-10-CM | POA: Diagnosis not present

## 2019-08-16 DIAGNOSIS — I130 Hypertensive heart and chronic kidney disease with heart failure and stage 1 through stage 4 chronic kidney disease, or unspecified chronic kidney disease: Secondary | ICD-10-CM | POA: Diagnosis not present

## 2019-08-18 DIAGNOSIS — I5032 Chronic diastolic (congestive) heart failure: Secondary | ICD-10-CM | POA: Diagnosis not present

## 2019-08-18 DIAGNOSIS — I130 Hypertensive heart and chronic kidney disease with heart failure and stage 1 through stage 4 chronic kidney disease, or unspecified chronic kidney disease: Secondary | ICD-10-CM | POA: Diagnosis not present

## 2019-08-18 DIAGNOSIS — L209 Atopic dermatitis, unspecified: Secondary | ICD-10-CM | POA: Diagnosis not present

## 2019-08-18 DIAGNOSIS — B9561 Methicillin susceptible Staphylococcus aureus infection as the cause of diseases classified elsewhere: Secondary | ICD-10-CM | POA: Diagnosis not present

## 2019-08-18 DIAGNOSIS — R7881 Bacteremia: Secondary | ICD-10-CM | POA: Diagnosis not present

## 2019-08-18 DIAGNOSIS — N189 Chronic kidney disease, unspecified: Secondary | ICD-10-CM | POA: Diagnosis not present

## 2019-08-19 DIAGNOSIS — L309 Dermatitis, unspecified: Secondary | ICD-10-CM | POA: Diagnosis not present

## 2019-08-19 DIAGNOSIS — R21 Rash and other nonspecific skin eruption: Secondary | ICD-10-CM | POA: Diagnosis not present

## 2019-08-20 ENCOUNTER — Ambulatory Visit (INDEPENDENT_AMBULATORY_CARE_PROVIDER_SITE_OTHER): Payer: Medicare Other | Admitting: Family Medicine

## 2019-08-20 ENCOUNTER — Other Ambulatory Visit: Payer: Self-pay

## 2019-08-20 ENCOUNTER — Encounter: Payer: Self-pay | Admitting: Family Medicine

## 2019-08-20 VITALS — BP 126/77 | HR 82 | Temp 97.5°F | Ht 70.0 in | Wt 150.0 lb

## 2019-08-20 DIAGNOSIS — Z8673 Personal history of transient ischemic attack (TIA), and cerebral infarction without residual deficits: Secondary | ICD-10-CM | POA: Diagnosis not present

## 2019-08-20 DIAGNOSIS — K219 Gastro-esophageal reflux disease without esophagitis: Secondary | ICD-10-CM

## 2019-08-20 DIAGNOSIS — H6123 Impacted cerumen, bilateral: Secondary | ICD-10-CM

## 2019-08-20 DIAGNOSIS — R062 Wheezing: Secondary | ICD-10-CM | POA: Diagnosis not present

## 2019-08-20 DIAGNOSIS — L309 Dermatitis, unspecified: Secondary | ICD-10-CM

## 2019-08-20 DIAGNOSIS — R1310 Dysphagia, unspecified: Secondary | ICD-10-CM | POA: Diagnosis not present

## 2019-08-20 NOTE — Progress Notes (Signed)
10/23/20204:16 PM  [name] June 06, 1932, 85 y.o., male [id number]  Chief Complaint  Patient presents with  . Follow-up    wheezing has completely stopped since yesterday, using kenalog and other body wash has helped alot.     HPI:   Patient is a 85 y.o. male with past medical history significant for HTN, asthma, BPH, CKD, HLP, CVA, osteoporosiswho presents today for routine followup  Last OV 3 weeks ago  Started PPI as was choking, coughing having issues swallowing, wheezing with meals. Has known hiatal hernia Also changed proventil to proair, referred to pulm Yesterday saw derm - intense skin treatment program, did bx, sees again in 2 weeks Wheezing and coughing much improved Has only needed albuterol twice  needs modified barium swallow order, I placed the wrong order, see previous note Has upcoming appt with pulm Needs letter for college age granddaughter that provides half time care, as being requested to attend to school Requesting ears to be cleaned, having decreased hearing  Depression screen [facility] 2/9 08/20/2019 07/30/2019 05/07/2019  Decreased Interest 0 0 0  Down, Depressed, Hopeless 0 0 0  PHQ - 2 Score 0 0 0  Altered sleeping - - -  Tired, decreased energy - - -  Change in appetite - - -  Feeling bad or failure about yourself  - - -  Trouble concentrating - - -  Moving slowly or fidgety/restless - - -  Suicidal thoughts - - -  PHQ-9 Score - - -  Difficult doing work/chores - - -    Fall Risk  08/20/2019 07/30/2019 05/07/2019 04/27/2019 04/01/2019  Falls in the past year? 0 1 0 1 1  Comment - - - - -  Number falls in past yr: 0 1 0 1 0  Injury with Fall? 0 1 0 1 1  Comment - - - bruising due to altered mental status, pt fine 3 weeks ago per daughter -  Risk Factor Category  - - - - -  Risk for fall due to : Impaired mobility;Impaired balance/gait;Orthopedic patient - - - -  Follow up - - - Falls evaluation completed Falls evaluation completed     Allergies   Allergen Reactions  . Other Shortness Of Breath, Itching and Other (See Comments)    Seasonal allergies- Runny nose, congestion, itchy eyes, and wheezing    Prior to Admission medications   Medication Sig Start Date End Date Taking? Authorizing Provider  acetaminophen (TYLENOL) 500 MG tablet Take 500-1,000 mg by mouth as needed for mild pain or headache (pain from eczema).    Yes [provider]  albuterol (PROAIR HFA) 108 (90 Base) MCG/ACT inhaler Inhale 1-2 puffs into the lungs every 6 (six) hours as needed for wheezing or shortness of breath. 07/30/19  Yes Rutherford Guys, MD  alendronate (FOSAMAX) 70 MG tablet TAKE 1 TABLET EVERY 7 DAYS. TAKE WITH A FULL GLASS OF WATER ON AN EMPTY STOMACH. 08/13/19  Yes Rutherford Guys, MD  beclomethasone (QVAR REDIHALER) 40 MCG/ACT inhaler Inhale 1 puff into the lungs 2 (two) times daily. 05/07/19  Yes Rutherford Guys, MD  Blood Pressure KIT Please check BP twice a day 03/25/19  Yes Rutherford Guys, MD  clopidogrel (PLAVIX) 75 MG tablet TAKE 1 TABLET ONCE DAILY. Patient taking differently: Take 75 mg by mouth daily.  06/07/19  Yes Rutherford Guys, MD  docusate sodium (COLACE) 100 MG capsule Take 1 capsule (100 mg total) by mouth daily as needed for mild  constipation. 07/21/19  Yes Hongalgi, Lenis Dickinson, MD  furosemide (LASIX) 40 MG tablet Take 0.5 tablets (20 mg total) by mouth daily. 07/22/19  Yes Hongalgi, Lenis Dickinson, MD  metoprolol succinate (TOPROL-XL) 50 MG 24 hr tablet TAKE 1 TABLET DAILY WITH OR IMMEDIATELY FOLLOWING A MEAL. 08/13/19  Yes Rutherford Guys, MD  Misc. Devices [facility]) MISC Use walker whenever walking, standard walker 07/27/18  Yes Rutherford Guys, MD  omeprazole (PRILOSEC) 20 MG capsule Take 1 capsule (20 mg total) by mouth 2 (two) times daily before a meal. 07/30/19  Yes Rutherford Guys, MD  pravastatin (PRAVACHOL) 40 MG tablet TAKE 1 TABLET ONCE DAILY. Patient taking differently: Take 40 mg by mouth daily.  05/05/19  Yes Rutherford Guys, MD  traZODone (DESYREL) 50 MG tablet Take 1-2 tablets (50-100 mg total) by mouth at bedtime. 08/13/19  Yes Rutherford Guys, MD  triamcinolone (KENALOG) 0.025 % ointment Apply 1 application topically 2 (two) times daily.   Yes [provider]    Past Medical History:  Diagnosis Date  . Allergy    seasonal  fall  . Asthma    as child  . Cancer ([place])    Basal cell carcinoma; multiple  . Dyspnea   . Eczema   . Hyperlipidemia   . Hypertension   . Osteoporosis   . Stroke [facility])     Past Surgical History:  Procedure Laterality Date  . CATARACT EXTRACTION, BILATERAL  06/29/2015  . INTRAMEDULLARY (IM) NAIL INTERTROCHANTERIC Left 03/30/2017   Procedure: INTRAMEDULLARY (IM) NAIL INTERTROCHANTRIC;  Surgeon: [name], MD;  Location: [place];  Service: Orthopedics;  Laterality: Left;  . INTRAMEDULLARY (IM) NAIL INTERTROCHANTERIC Right 11/12/2018   Procedure: INTRAMEDULLARY (IM) NAIL RIGHT INTERTROCHANTERIC HIP FRACTURE;  Surgeon: [name], MD;  Location: [place];  Service: Orthopedics;  Laterality: Right;  . MOHS SURGERY    . TEE WITHOUT CARDIOVERSION N/A 07/20/2019   Procedure: TRANSESOPHAGEAL ECHOCARDIOGRAM (TEE);  Surgeon: [name], MD;  Location: [facility] ENDOSCOPY;  Service: Cardiovascular;  Laterality: N/A;  . TONSILLECTOMY  1940 maybe    Social History   Tobacco Use  . Smoking status: Never Smoker  . Smokeless tobacco: Never Used  Substance Use Topics  . Alcohol use: No    Comment: occas    Family History  Problem Relation Age of Onset  . Arthritis Mother   . Heart disease Mother 77  . Hypertension Father     ROS Per hpi  OBJECTIVE:  Today's Vitals   08/20/19 1607  BP: 126/77  Pulse: 82  Temp: (!) 97.5 F (36.4 C)  SpO2: 94%  Weight: 150 lb (68 kg)  Height: '5\' 10"'  (1.778 m)   Body mass index is 21.52 kg/m.   Physical Exam Vitals signs and nursing note reviewed.  Constitutional:      Appearance: He is well-developed.  HENT:      Head: Normocephalic and atraumatic.     Right Ear: Hearing, ear canal and external ear normal. There is impacted cerumen.     Left Ear: Hearing, ear canal and external ear normal. There is impacted cerumen.     Mouth/Throat:     Pharynx: No oropharyngeal exudate.  Eyes:     Conjunctiva/sclera: Conjunctivae normal.     Pupils: Pupils are equal, round, and reactive to light.  Neck:     Musculoskeletal: Neck supple.  Cardiovascular:     Rate and Rhythm: Normal rate and regular rhythm.  Heart sounds: No murmur. No friction rub. No gallop.   Pulmonary:     Effort: Pulmonary effort is normal.     Breath sounds: Normal breath sounds. No wheezing or rales.  Lymphadenopathy:     Cervical: No cervical adenopathy.  Skin:    General: Skin is warm and dry.  Neurological:     Mental Status: He is alert and oriented to person, place, and time.     No results found for this or any previous visit (from the past 24 hour(s)).  No results found.   ASSESSMENT and PLAN  1. Gastroesophageal reflux disease, unspecified whether esophagitis present 2. Dysphagia, unspecified type 3. History of CVA (cerebrovascular accident) Improved with PPI. Modified barium swallow ordered - SLP modified barium swallow; Future - DG OP Swallowing Func-Medicare/Speech Path; Future  4. Wheezing Improved, pending pulm appt - SLP modified barium swallow; Future - DG OP Swallowing Func-Medicare/Speech Path; Future  5. Eczema, unspecified type Under derm care  6. Excessive cerumen in both ear canals - Ear Lavage - done by CMA wo issues  Other orders   Return in about 3 months (around 11/20/2019).    Rutherford Guys, MD Primary Care at [place], [place] [address] Ph.  [phone] Fax [phone]

## 2019-08-20 NOTE — Patient Instructions (Signed)
° ° ° °  If you have lab work done today you will be contacted with your lab results within the next 2 weeks.  If you have not heard from us then please contact us. The fastest way to get your results is to register for My Chart. ° ° °IF you received an x-ray today, you will receive an invoice from [facility]. Please contact  Radiology at [phone] with questions or concerns regarding your invoice.  ° °IF you received labwork today, you will receive an invoice from [facility]. Please contact [facility] at [phone] with questions or concerns regarding your invoice.  ° °Our billing staff will not be able to assist you with questions regarding bills from these companies. ° °You will be contacted with the lab results as soon as they are available. The fastest way to get your results is to activate your My Chart account. Instructions are located on the last page of this paperwork. If you have not heard from us regarding the results in 2 weeks, please contact this office. °  ° ° ° °

## 2019-08-22 DIAGNOSIS — M810 Age-related osteoporosis without current pathological fracture: Secondary | ICD-10-CM | POA: Diagnosis not present

## 2019-08-22 DIAGNOSIS — I130 Hypertensive heart and chronic kidney disease with heart failure and stage 1 through stage 4 chronic kidney disease, or unspecified chronic kidney disease: Secondary | ICD-10-CM | POA: Diagnosis not present

## 2019-08-22 DIAGNOSIS — Z9981 Dependence on supplemental oxygen: Secondary | ICD-10-CM | POA: Diagnosis not present

## 2019-08-22 DIAGNOSIS — Z85828 Personal history of other malignant neoplasm of skin: Secondary | ICD-10-CM | POA: Diagnosis not present

## 2019-08-22 DIAGNOSIS — B9561 Methicillin susceptible Staphylococcus aureus infection as the cause of diseases classified elsewhere: Secondary | ICD-10-CM | POA: Diagnosis not present

## 2019-08-22 DIAGNOSIS — Z8673 Personal history of transient ischemic attack (TIA), and cerebral infarction without residual deficits: Secondary | ICD-10-CM | POA: Diagnosis not present

## 2019-08-22 DIAGNOSIS — R7881 Bacteremia: Secondary | ICD-10-CM | POA: Diagnosis not present

## 2019-08-22 DIAGNOSIS — N189 Chronic kidney disease, unspecified: Secondary | ICD-10-CM | POA: Diagnosis not present

## 2019-08-22 DIAGNOSIS — J4520 Mild intermittent asthma, uncomplicated: Secondary | ICD-10-CM | POA: Diagnosis not present

## 2019-08-22 DIAGNOSIS — L209 Atopic dermatitis, unspecified: Secondary | ICD-10-CM | POA: Diagnosis not present

## 2019-08-22 DIAGNOSIS — Z8781 Personal history of (healed) traumatic fracture: Secondary | ICD-10-CM | POA: Diagnosis not present

## 2019-08-22 DIAGNOSIS — I5032 Chronic diastolic (congestive) heart failure: Secondary | ICD-10-CM | POA: Diagnosis not present

## 2019-08-22 DIAGNOSIS — E041 Nontoxic single thyroid nodule: Secondary | ICD-10-CM | POA: Diagnosis not present

## 2019-08-23 DIAGNOSIS — L209 Atopic dermatitis, unspecified: Secondary | ICD-10-CM | POA: Diagnosis not present

## 2019-08-23 DIAGNOSIS — N189 Chronic kidney disease, unspecified: Secondary | ICD-10-CM | POA: Diagnosis not present

## 2019-08-23 DIAGNOSIS — I5032 Chronic diastolic (congestive) heart failure: Secondary | ICD-10-CM | POA: Diagnosis not present

## 2019-08-23 DIAGNOSIS — I130 Hypertensive heart and chronic kidney disease with heart failure and stage 1 through stage 4 chronic kidney disease, or unspecified chronic kidney disease: Secondary | ICD-10-CM | POA: Diagnosis not present

## 2019-08-23 DIAGNOSIS — R7881 Bacteremia: Secondary | ICD-10-CM | POA: Diagnosis not present

## 2019-08-23 DIAGNOSIS — B9561 Methicillin susceptible Staphylococcus aureus infection as the cause of diseases classified elsewhere: Secondary | ICD-10-CM | POA: Diagnosis not present

## 2019-08-24 ENCOUNTER — Other Ambulatory Visit: Payer: Self-pay

## 2019-08-24 ENCOUNTER — Encounter: Payer: Self-pay | Admitting: Critical Care Medicine

## 2019-08-24 ENCOUNTER — Ambulatory Visit (INDEPENDENT_AMBULATORY_CARE_PROVIDER_SITE_OTHER): Payer: Medicare Other | Admitting: Critical Care Medicine

## 2019-08-24 VITALS — BP 106/58 | HR 68 | Temp 97.9°F | Ht 70.0 in | Wt 150.0 lb

## 2019-08-24 DIAGNOSIS — J90 Pleural effusion, not elsewhere classified: Secondary | ICD-10-CM

## 2019-08-24 DIAGNOSIS — E079 Disorder of thyroid, unspecified: Secondary | ICD-10-CM | POA: Diagnosis not present

## 2019-08-24 DIAGNOSIS — R062 Wheezing: Secondary | ICD-10-CM | POA: Diagnosis not present

## 2019-08-24 DIAGNOSIS — I5030 Unspecified diastolic (congestive) heart failure: Secondary | ICD-10-CM | POA: Diagnosis not present

## 2019-08-24 DIAGNOSIS — R1310 Dysphagia, unspecified: Secondary | ICD-10-CM

## 2019-08-24 NOTE — Progress Notes (Signed)
Synopsis: Referred in October 2020 for wheezing by Rutherford Guys, MD.  Subjective:   PATIENT ID: [name] GENDER: male DOB: 04-20-1932, MRN: [id number]  Chief Complaint  Patient presents with  . Consult    referred by PCP for sob, wheezing, asthma.  Pt's caregiver notes that he has frequent bouts of aspiration that will cause his respiratory problems.      [name] is an 85 year old gentleman with a history of childhood asthma who presents for evaluation of asthma at the request of his primary care provider.  He presents with his caregiver [name], who has been working with him for 4 years.  He has 24-hour caregivers at home.  He was recently hospitalized with MSSA bacteremia due to cellulitis from uncontrolled eczema in his legs.  At that time he had decompensated heart failure, significant lower extremity bilateral edema, and bilateral pleural effusions.  He was diuresed while he was admitted, and completed his antibiotic regimen with linezolid.  While hospitalized he was diagnosed with a >5 cm left thyroid mass, which has not yet been biopsied.  His daughter is very eager to know the results of this biopsy.  He had asthma as a child, and about 4 months ago was started on 2 L of nocturnal oxygen.  About 2.5 months ago he was started on Qvar twice daily with albuterol as needed.  Prior to his recent hospitalization he was requiring albuterol frequently throughout the day, increased from seldom being needed.  He has been using his albuterol since discharge, but his wheezing is most notable after eating.  He has a very wet sounding wheeze, which improves on its own over about an hour after eating. He has frequent eye tearing & coughing when eating and frequent throat clearing after eating.  There is concern that when he eats he is aspirating.  His family is concerned that his omeprazole is not controlling his aspiration; he has a known hiatal hernia.  He had an episode of pneumonia requiring  hospitalization in 2019.  Swallow study has been ordered, but has not been completed yet.  His eczema has been managed recently by dermatology.  He recently had a biopsy, and he has been having aggressive topical treatments, which have significantly improved his skin.  His diffuse itching is less frequent.  He has had frequent falls in the past, including bilateral hip fractures.  He ambulates with a walker at home, and has not fallen recently.  He has home health physical therapy.     Past Medical History:  Diagnosis Date  . Allergy    seasonal  fall  . Asthma    as child  . Cancer ([place])    Basal cell carcinoma; multiple  . Dyspnea   . Eczema   . Hyperlipidemia   . Hypertension   . MSSA bacteremia 2020  . Osteoporosis   . Pleural effusion due to CHF (congestive heart failure) ([place])   . Stroke [facility])      Family History  Problem Relation Age of Onset  . Arthritis Mother   . Heart disease Mother 46  . Hypertension Father      Past Surgical History:  Procedure Laterality Date  . CATARACT EXTRACTION, BILATERAL  06/29/2015  . INTRAMEDULLARY (IM) NAIL INTERTROCHANTERIC Left 03/30/2017   Procedure: INTRAMEDULLARY (IM) NAIL INTERTROCHANTRIC;  Surgeon: [name], MD;  Location: [place];  Service: Orthopedics;  Laterality: Left;  . INTRAMEDULLARY (IM) NAIL INTERTROCHANTERIC Right 11/12/2018   Procedure: INTRAMEDULLARY (IM) NAIL  RIGHT INTERTROCHANTERIC HIP FRACTURE;  Surgeon: [name], MD;  Location: [place];  Service: Orthopedics;  Laterality: Right;  . MOHS SURGERY    . TEE WITHOUT CARDIOVERSION N/A 07/20/2019   Procedure: TRANSESOPHAGEAL ECHOCARDIOGRAM (TEE);  Surgeon: [name], MD;  Location: [facility] ENDOSCOPY;  Service: Cardiovascular;  Laterality: N/A;  . TONSILLECTOMY  1940 maybe    Social History   Socioeconomic History  . Marital status: Married    Spouse name: Not on file  . Number of children: 2  . Years of education: Not on file  . Highest education level:  Not on file  Occupational History  . Occupation: retired  Scientific laboratory technician  . Financial resource strain: Not on file  . Food insecurity    Worry: Not on file    Inability: Not on file  . Transportation needs    Medical: Not on file    Non-medical: Not on file  Tobacco Use  . Smoking status: Never Smoker  . Smokeless tobacco: Never Used  Substance and Sexual Activity  . Alcohol use: No    Comment: occas  . Drug use: No  . Sexual activity: Not Currently  Lifestyle  . Physical activity    Days per week: Not on file    Minutes per session: Not on file  . Stress: Not on file  Relationships  . Social Herbalist on phone: Not on file    Gets together: Not on file    Attends religious service: Not on file    Active member of club or organization: Not on file    Attends meetings of clubs or organizations: Not on file    Relationship status: Not on file  . Intimate partner violence    Fear of current or ex partner: No    Emotionally abused: No    Physically abused: No    Forced sexual activity: No  Other Topics Concern  . Not on file  Social History Narrative   Marital status: divorced; good friend with ex-wife; not dating      Children: 2 children; 4 grandchildren; no gg      Lives:  Alone in house; daughter six blocks away      Employment: college professor; retired in 1997; history; [place]:  Never      Alcohol: tequila loves but won't buy it.      Exercise:  As much as can; previous competitive biking.      ADLs: quit mowing grass in 2017; no assistant devices; cooks and cleans and pays bills.  No driving; HAS NEVER DRIVEN; has always ridden bike.  Daughter takes patient to grocery store and to appointments.  Daughter drives patient to grocery store.      Advanced Directives:  No living; DNR; do not resuscitate.  HCPOA: [name]     Allergies  Allergen Reactions  . Other Shortness Of Breath, Itching and Other (See Comments)    Seasonal  allergies- Runny nose, congestion, itchy eyes, and wheezing     Immunization History  Administered Date(s) Administered  . Influenza, High Dose Seasonal PF 07/27/2018  . Influenza, Seasonal, Injecte, Preservative Fre 10/14/2012  . Influenza,inj,Quad PF,6+ Mos 12/07/2014, 08/11/2015, 09/11/2016, 08/25/2017  . Pneumococcal Conjugate-13 06/03/2014  . Pneumococcal Polysaccharide-23 04/14/2007, 09/22/2015  . Tdap 04/14/2007, 09/25/2016  . Zoster 12/06/2011    Outpatient Medications Prior to Visit  Medication Sig Dispense Refill  . acetaminophen (TYLENOL) 500 MG tablet Take  500-1,000 mg by mouth as needed for mild pain or headache (pain from eczema).     Marland Kitchen albuterol (PROAIR HFA) 108 (90 Base) MCG/ACT inhaler Inhale 1-2 puffs into the lungs every 6 (six) hours as needed for wheezing or shortness of breath. 8 g 3  . alendronate (FOSAMAX) 70 MG tablet TAKE 1 TABLET EVERY 7 DAYS. TAKE WITH A FULL GLASS OF WATER ON AN EMPTY STOMACH. 4 tablet 0  . beclomethasone (QVAR REDIHALER) 40 MCG/ACT inhaler Inhale 1 puff into the lungs 2 (two) times daily. 10.6 g 3  . Blood Pressure KIT Please check BP twice a day 1 each 0  . clopidogrel (PLAVIX) 75 MG tablet TAKE 1 TABLET ONCE DAILY. (Patient taking differently: Take 75 mg by mouth daily. ) 90 tablet 0  . docusate sodium (COLACE) 100 MG capsule Take 1 capsule (100 mg total) by mouth daily as needed for mild constipation.    . furosemide (LASIX) 40 MG tablet Take 0.5 tablets (20 mg total) by mouth daily. 30 tablet 0  . metoprolol succinate (TOPROL-XL) 50 MG 24 hr tablet TAKE 1 TABLET DAILY WITH OR IMMEDIATELY FOLLOWING A MEAL. 90 tablet 0  . Misc. Devices (WALKER) MISC Use walker whenever walking, standard walker 1 each 0  . omeprazole (PRILOSEC) 20 MG capsule Take 1 capsule (20 mg total) by mouth 2 (two) times daily before a meal. 60 capsule 3  . pravastatin (PRAVACHOL) 40 MG tablet TAKE 1 TABLET ONCE DAILY. (Patient taking differently: Take 40 mg by mouth  daily. ) 90 tablet 0  . traZODone (DESYREL) 50 MG tablet Take 1-2 tablets (50-100 mg total) by mouth at bedtime. 180 tablet 1  . triamcinolone (KENALOG) 0.025 % ointment Apply 1 application topically 2 (two) times daily.     No facility-administered medications prior to visit.     Review of Systems  Constitutional: Negative for chills and fever.  HENT: Negative for congestion.        Throat clearing after eating  Eyes: Negative.   Respiratory: Positive for cough, shortness of breath and wheezing.   Cardiovascular:       Leg swelling improved since hospitalization  Gastrointestinal: Negative for heartburn and vomiting.  Musculoskeletal:       No recent falls, ambulates with walker  Skin:       Improving eczema  Neurological: Positive for weakness.     Objective:   Vitals:   08/24/19 1526  BP: (!) 106/58  Pulse: 68  Temp: 97.9 F (36.6 C)  TempSrc: Oral  SpO2: 97%  Weight: 150 lb (68 kg)  Height: '5\' 10"'  (1.778 m)   97% on   RA BMI Readings from Last 3 Encounters:  08/24/19 21.52 kg/m  08/20/19 21.52 kg/m  07/30/19 21.72 kg/m   Wt Readings from Last 3 Encounters:  08/24/19 150 lb (68 kg)  08/20/19 150 lb (68 kg)  07/30/19 151 lb 6.4 oz (68.7 kg)    Physical Exam Vitals signs reviewed.  Constitutional:      General: He is not in acute distress.    Appearance: He is not ill-appearing.     Comments: Frail appearing elderly man sitting in a wheelchair  HENT:     Head: Normocephalic and atraumatic.     Nose:     Comments: Deferred due to masking requirement.    Mouth/Throat:     Comments: Deferred due to masking requirement. Eyes:     General: No scleral icterus.    Comments: Bilateral ectropion  Cardiovascular:     Rate and Rhythm: Normal rate and regular rhythm.     Heart sounds: No murmur.  Pulmonary:     Comments: Breathing comfortably on room air, intermittent tachypnea.  Reduced breath sounds in the right base, clear to auscultation bilaterally  otherwise. Abdominal:     General: There is no distension.     Palpations: Abdomen is soft.  Musculoskeletal:        General: No swelling or deformity.  Skin:    General: Skin is warm and dry.     Comments: No distal lower extremity hair growth, improving eczema, minimal excoriations.  Neurological:     Mental Status: He is alert.     Coordination: Coordination normal.     Comments: Global weakness, moving all extremities spontaneously.  Answering questions appropriately, but needing help to answer questions accurately.  Psychiatric:        Mood and Affect: Mood normal.        Behavior: Behavior normal.      CBC    Component Value Date/Time   WBC 13.1 (A) 07/30/2019 1619   WBC 11.4 (H) 07/20/2019 0540   RBC 3.92 (A) 07/30/2019 1619   RBC 4.08 (L) 07/20/2019 0540   HGB 12.4 07/30/2019 1619   HGB 13.0 07/20/2019 0540   HGB 14.3 03/11/2018 1545   HCT 37.7 07/30/2019 1619   HCT 39.3 07/20/2019 0540   HCT 43.6 03/11/2018 1545   PLT 266 07/20/2019 0540   PLT 198 03/11/2018 1545   MCV 96.3 07/30/2019 1619   MCV 96 03/11/2018 1545   MCH 31.6 (A) 07/30/2019 1619   MCH 31.9 07/20/2019 0540   MCHC 32.8 07/30/2019 1619   MCHC 33.1 07/20/2019 0540   RDW 12.0 07/20/2019 0540   RDW 12.7 03/11/2018 1545   LYMPHSABS 1.4 07/20/2019 0540   LYMPHSABS 1.5 03/11/2018 1545   MONOABS 0.8 07/20/2019 0540   EOSABS 1.0 (H) 07/20/2019 0540   EOSABS 1.1 (H) 03/11/2018 1545   BASOSABS 0.1 07/20/2019 0540   BASOSABS 0.0 03/11/2018 1545    CHEMISTRY CMP Latest Ref Rng & Units 07/30/2019 07/21/2019 07/20/2019  Glucose 65 - 99 mg/dL 121(H) 93 104(H)  BUN 8 - 27 mg/dL '12 16 15  ' Creatinine 0.76 - 1.27 mg/dL 1.05 1.42(H) 1.19  Sodium 134 - 144 mmol/L 139 137 137  Potassium 3.5 - 5.2 mmol/L 4.2 4.7 3.7  Chloride 96 - 106 mmol/L 98 102 100  CO2 20 - 29 mmol/L '21 25 28  ' Calcium 8.6 - 10.2 mg/dL 8.8 8.0(L) 8.1(L)  Total Protein 6.5 - 8.1 g/dL - - -  Total Bilirubin 0.3 - 1.2 mg/dL - - -  Alkaline  Phos 38 - 126 U/L - - -  AST 15 - 41 U/L - - -  ALT 0 - 44 U/L - - -     Chest Imaging- films reviewed: CXR, 2 view 07/30/2019- elevated right hemidiaphragm, persistent left pleural effusion  CTA chest 07/14/2019- tracheal deviation to the right right and large cystic appearing thyroid mass.  Right greater than left dependant pleural effusions with significant right-sided volume loss and compressive atelectasis.  Diffuse groundglass opacities bilaterally, minimal fluid in bilateral major fissures.  Large pulmonary veins, generous left atrium.  No significant mediastinal or hilar adenopathy.  Patulous esophagus, which is also displaced by thyroid mass.  Kyphosis.  No PE.  Pulmonary Functions Testing Results: No flowsheet data found.   Echocardiogram 07/20/2019: LVEF 55 to 60%, no LVH.  Normal LA, RV,  RA.  Mild TR and MR.  Moderate aortic sclerosis without stenosis.  No valve vegetations. Echocardiogram 07/17/2019-essentially the same, diastolic dysfunction present.     Assessment & Plan:     ICD-10-CM   1. Wheezing  R06.2 Ambulatory referral to Home Health  2. Pleural effusion  J90   3. Heart failure with preserved ejection fraction, unspecified HF chronicity (HCC)  I50.30   4. Dysphagia, unspecified type  R13.10 Ambulatory referral to [facility]    Ambulatory referral to ENT  5. Thyroid mass  E07.9 Ambulatory referral to ENT    Wheezing due to dysphagia.  Concern for aspiration related to thyroid mass and muscle weakness. -Agree with swallow study -Home health SLP prescribed; he needs evaluation and treatment for dysphagia and likely aspiration.  I have updated his family that he may require dietary modifications, but the goal would be for rehabilitation to liberalize his diet again. -Needs evaluation of thyroid mass, which is likely complicating his coordination of his swallow.  Thyroid mass -ENT referral  Asthma- likely cardiogenic and related to aspiration -Discussed with Mr.  Sangalang and his daughter Wesley and caretaker [name] that it is very challenging to keep asthma controlled with active aspiration or reflux.  -No changes to asthma regimen at this time- continue Qvar twice daily and albuterol as needed.  I am concerned that his wheezing is more related to retained secretions his upper airway than a bronchogenic source. -No change to GERD regimen at this time-continue omeprazole daily -Flu shot today.  Up-to-date on pneumococcal vaccinations.  Small, dependent bilateral pleural effusions associated with heart failure  -Continue outpatient diuretics -No indication for further evaluation or drainage at this time  RTC in about 6 weeks.  I have updated Mr. Villalpando daughter Wesley over the phone regarding the assessment and plan.  I have spent greater than 90 minutes on this encounter, including greater than 60 minutes face-to-face with the patient.     Current Outpatient Medications:  .  acetaminophen (TYLENOL) 500 MG tablet, Take 500-1,000 mg by mouth as needed for mild pain or headache (pain from eczema). , Disp: , Rfl:  .  albuterol (PROAIR HFA) 108 (90 Base) MCG/ACT inhaler, Inhale 1-2 puffs into the lungs every 6 (six) hours as needed for wheezing or shortness of breath., Disp: 8 g, Rfl: 3 .  alendronate (FOSAMAX) 70 MG tablet, TAKE 1 TABLET EVERY 7 DAYS. TAKE WITH A FULL GLASS OF WATER ON AN EMPTY STOMACH., Disp: 4 tablet, Rfl: 0 .  beclomethasone (QVAR REDIHALER) 40 MCG/ACT inhaler, Inhale 1 puff into the lungs 2 (two) times daily., Disp: 10.6 g, Rfl: 3 .  Blood Pressure KIT, Please check BP twice a day, Disp: 1 each, Rfl: 0 .  clopidogrel (PLAVIX) 75 MG tablet, TAKE 1 TABLET ONCE DAILY. (Patient taking differently: Take 75 mg by mouth daily. ), Disp: 90 tablet, Rfl: 0 .  docusate sodium (COLACE) 100 MG capsule, Take 1 capsule (100 mg total) by mouth daily as needed for mild constipation., Disp: , Rfl:  .  furosemide (LASIX) 40 MG tablet, Take 0.5 tablets (20 mg  total) by mouth daily., Disp: 30 tablet, Rfl: 0 .  metoprolol succinate (TOPROL-XL) 50 MG 24 hr tablet, TAKE 1 TABLET DAILY WITH OR IMMEDIATELY FOLLOWING A MEAL., Disp: 90 tablet, Rfl: 0 .  Misc. Devices (WALKER) MISC, Use walker whenever walking, standard walker, Disp: 1 each, Rfl: 0 .  omeprazole (PRILOSEC) 20 MG capsule, Take 1 capsule (20 mg total) by mouth 2 (  two) times daily before a meal., Disp: 60 capsule, Rfl: 3 .  pravastatin (PRAVACHOL) 40 MG tablet, TAKE 1 TABLET ONCE DAILY. (Patient taking differently: Take 40 mg by mouth daily. ), Disp: 90 tablet, Rfl: 0 .  traZODone (DESYREL) 50 MG tablet, Take 1-2 tablets (50-100 mg total) by mouth at bedtime., Disp: 180 tablet, Rfl: 1 .  triamcinolone (KENALOG) 0.025 % ointment, Apply 1 application topically 2 (two) times daily., Disp: , Rfl:    Julian Hy, DO [facility] 08/24/2019 5:44 PM

## 2019-08-24 NOTE — Patient Instructions (Signed)
Thank you for visiting Dr. Carlis Abbott at [facility]. We recommend the following: Orders Placed This Encounter  Procedures  . Ambulatory referral to Home Health  . Ambulatory referral to ENT   Orders Placed This Encounter  Procedures  . Ambulatory referral to Home Health    Referral Priority:   Routine    Referral Type:   Home Health Care    Referral Reason:   Specialty Services Required    Requested Specialty:   [place]    Number of Visits Requested:   1  . Ambulatory referral to ENT    Referral Priority:   Urgent    Referral Type:   Consultation    Referral Reason:   Specialty Services Required    Requested Specialty:   Otolaryngology    Number of Visits Requested:   1   Keep taking your asthma and acid reflux medicines.    Return in about 6 weeks (around 10/05/2019).    Please do your part to reduce the spread of COVID-19.

## 2019-08-25 DIAGNOSIS — E079 Disorder of thyroid, unspecified: Secondary | ICD-10-CM | POA: Diagnosis not present

## 2019-08-26 ENCOUNTER — Other Ambulatory Visit: Payer: Self-pay | Admitting: Otolaryngology

## 2019-08-26 DIAGNOSIS — L209 Atopic dermatitis, unspecified: Secondary | ICD-10-CM | POA: Diagnosis not present

## 2019-08-26 DIAGNOSIS — R7881 Bacteremia: Secondary | ICD-10-CM | POA: Diagnosis not present

## 2019-08-26 DIAGNOSIS — B9561 Methicillin susceptible Staphylococcus aureus infection as the cause of diseases classified elsewhere: Secondary | ICD-10-CM | POA: Diagnosis not present

## 2019-08-26 DIAGNOSIS — E079 Disorder of thyroid, unspecified: Secondary | ICD-10-CM

## 2019-08-26 DIAGNOSIS — N189 Chronic kidney disease, unspecified: Secondary | ICD-10-CM | POA: Diagnosis not present

## 2019-08-26 DIAGNOSIS — I130 Hypertensive heart and chronic kidney disease with heart failure and stage 1 through stage 4 chronic kidney disease, or unspecified chronic kidney disease: Secondary | ICD-10-CM | POA: Diagnosis not present

## 2019-08-26 DIAGNOSIS — I5032 Chronic diastolic (congestive) heart failure: Secondary | ICD-10-CM | POA: Diagnosis not present

## 2019-08-30 ENCOUNTER — Telehealth: Payer: Self-pay | Admitting: Family Medicine

## 2019-08-30 DIAGNOSIS — N189 Chronic kidney disease, unspecified: Secondary | ICD-10-CM | POA: Diagnosis not present

## 2019-08-30 DIAGNOSIS — I5032 Chronic diastolic (congestive) heart failure: Secondary | ICD-10-CM | POA: Diagnosis not present

## 2019-08-30 DIAGNOSIS — I130 Hypertensive heart and chronic kidney disease with heart failure and stage 1 through stage 4 chronic kidney disease, or unspecified chronic kidney disease: Secondary | ICD-10-CM | POA: Diagnosis not present

## 2019-08-30 DIAGNOSIS — L209 Atopic dermatitis, unspecified: Secondary | ICD-10-CM | POA: Diagnosis not present

## 2019-08-30 DIAGNOSIS — R7881 Bacteremia: Secondary | ICD-10-CM | POA: Diagnosis not present

## 2019-08-30 DIAGNOSIS — B9561 Methicillin susceptible Staphylococcus aureus infection as the cause of diseases classified elsewhere: Secondary | ICD-10-CM | POA: Diagnosis not present

## 2019-08-30 NOTE — Telephone Encounter (Signed)
Nurse called to inform pcp that patient has had significant weight loss. Pt is currently weighing in at 140 lbs. Last weigh in was 148 lbs

## 2019-08-31 DIAGNOSIS — I5032 Chronic diastolic (congestive) heart failure: Secondary | ICD-10-CM | POA: Diagnosis not present

## 2019-08-31 DIAGNOSIS — L209 Atopic dermatitis, unspecified: Secondary | ICD-10-CM | POA: Diagnosis not present

## 2019-08-31 DIAGNOSIS — N189 Chronic kidney disease, unspecified: Secondary | ICD-10-CM | POA: Diagnosis not present

## 2019-08-31 DIAGNOSIS — R7881 Bacteremia: Secondary | ICD-10-CM | POA: Diagnosis not present

## 2019-08-31 DIAGNOSIS — B9561 Methicillin susceptible Staphylococcus aureus infection as the cause of diseases classified elsewhere: Secondary | ICD-10-CM | POA: Diagnosis not present

## 2019-08-31 DIAGNOSIS — I130 Hypertensive heart and chronic kidney disease with heart failure and stage 1 through stage 4 chronic kidney disease, or unspecified chronic kidney disease: Secondary | ICD-10-CM | POA: Diagnosis not present

## 2019-09-02 ENCOUNTER — Other Ambulatory Visit: Payer: Self-pay | Admitting: Family Medicine

## 2019-09-02 ENCOUNTER — Telehealth: Payer: Self-pay | Admitting: Family Medicine

## 2019-09-02 NOTE — Telephone Encounter (Signed)
Copied from [place] [id number]. Topic: General - Inquiry >> Sep 02, 2019 12:06 PM Richardo Priest, NT wrote: Reason for CRM: Tanzania Bullins called in, speech therapist with [facility], stating she is still waiting for an answer in regards to an order she had faxed over to office for patient to have a swallow test be done at [facility]. Please advise and call back is [phone].

## 2019-09-03 DIAGNOSIS — L309 Dermatitis, unspecified: Secondary | ICD-10-CM | POA: Diagnosis not present

## 2019-09-03 DIAGNOSIS — Z85828 Personal history of other malignant neoplasm of skin: Secondary | ICD-10-CM | POA: Diagnosis not present

## 2019-09-08 DIAGNOSIS — R7881 Bacteremia: Secondary | ICD-10-CM | POA: Diagnosis not present

## 2019-09-08 DIAGNOSIS — B9561 Methicillin susceptible Staphylococcus aureus infection as the cause of diseases classified elsewhere: Secondary | ICD-10-CM | POA: Diagnosis not present

## 2019-09-08 DIAGNOSIS — I5032 Chronic diastolic (congestive) heart failure: Secondary | ICD-10-CM | POA: Diagnosis not present

## 2019-09-08 DIAGNOSIS — I130 Hypertensive heart and chronic kidney disease with heart failure and stage 1 through stage 4 chronic kidney disease, or unspecified chronic kidney disease: Secondary | ICD-10-CM | POA: Diagnosis not present

## 2019-09-08 DIAGNOSIS — L209 Atopic dermatitis, unspecified: Secondary | ICD-10-CM | POA: Diagnosis not present

## 2019-09-08 DIAGNOSIS — N189 Chronic kidney disease, unspecified: Secondary | ICD-10-CM | POA: Diagnosis not present

## 2019-09-09 ENCOUNTER — Other Ambulatory Visit: Payer: Self-pay | Admitting: *Deleted

## 2019-09-09 ENCOUNTER — Telehealth: Payer: Self-pay

## 2019-09-09 ENCOUNTER — Telehealth: Payer: Self-pay | Admitting: *Deleted

## 2019-09-09 DIAGNOSIS — M25511 Pain in right shoulder: Secondary | ICD-10-CM

## 2019-09-09 NOTE — Telephone Encounter (Signed)
Called, lm for speech therapist Brittini Bullins for the modified barium swallow order. Order is in epi, received fax requesting order, following up on request.

## 2019-09-09 NOTE — Telephone Encounter (Signed)
Order was placed oct 23 Reached out to speech, pending medicare approval Daughter has been notified

## 2019-09-09 NOTE — Telephone Encounter (Signed)
spoke with Brittini, did confirm order for barium swallow, Once completed determination will be made as to what type of therapy pt will need. Requested speech therapy 1 week 1 of Nov. 16 2020.

## 2019-09-09 NOTE — Telephone Encounter (Signed)
Spoke with scheduling for BS they are waiting on some billing issues with Medicare, they will reach out to patients daughter when resolved.     Faxed over the ok from [name] to stop plavix 5 days before thyroid biopsy.      Spoke with daughter [name] they have already called and given her instructions and booked.    Also advised [name] wants an Xray for his appointment 09-10-2019 @ 3:00 pm .   Advise patient to call  Office in AM to see if we will have Xray if we do not an order was placed for patient to go to [place] before appointment to have this done.  Patients daughter voiced understanding

## 2019-09-10 ENCOUNTER — Ambulatory Visit (INDEPENDENT_AMBULATORY_CARE_PROVIDER_SITE_OTHER): Payer: Medicare Other

## 2019-09-10 ENCOUNTER — Ambulatory Visit (INDEPENDENT_AMBULATORY_CARE_PROVIDER_SITE_OTHER): Payer: Medicare Other | Admitting: Family Medicine

## 2019-09-10 ENCOUNTER — Encounter: Payer: Self-pay | Admitting: Family Medicine

## 2019-09-10 ENCOUNTER — Other Ambulatory Visit: Payer: Self-pay

## 2019-09-10 VITALS — BP 110/64 | HR 74 | Wt 136.0 lb

## 2019-09-10 DIAGNOSIS — R350 Frequency of micturition: Secondary | ICD-10-CM | POA: Diagnosis not present

## 2019-09-10 DIAGNOSIS — N401 Enlarged prostate with lower urinary tract symptoms: Secondary | ICD-10-CM

## 2019-09-10 DIAGNOSIS — M898X1 Other specified disorders of bone, shoulder: Secondary | ICD-10-CM | POA: Diagnosis not present

## 2019-09-10 DIAGNOSIS — R634 Abnormal weight loss: Secondary | ICD-10-CM

## 2019-09-10 DIAGNOSIS — Z125 Encounter for screening for malignant neoplasm of prostate: Secondary | ICD-10-CM

## 2019-09-10 DIAGNOSIS — M25511 Pain in right shoulder: Secondary | ICD-10-CM | POA: Diagnosis not present

## 2019-09-10 NOTE — Progress Notes (Signed)
11/13/20204:22 PM  [name] 19-Nov-1931, 85 y.o., male [id number]  Chief Complaint  Patient presents with   Shoulder Problem    right side shoulder problem    HPI:   Patient is a 85 y.o. male with past medical history significant for HTN, asthma, BPH, CKD, HLP, CVA, osteoporosis who presents today for right shoulder pain  Uses walker at home Has lost over 10 lbs in less than a month despite very good appettite No abd pain, nausea, vomiting, diarrhea, constipation, black stools, BRBPR No fever or chills Cough, wheezing and SOB improving with speech therapy and aspiration precautions/coaching Sign increase in urinary frequency/nocturia, no dysuria, no hematuria   Depression screen [facility] 2/9 09/10/2019 08/20/2019 07/30/2019  Decreased Interest 0 0 0  Down, Depressed, Hopeless 0 0 0  PHQ - 2 Score 0 0 0  Altered sleeping - - -  Tired, decreased energy - - -  Change in appetite - - -  Feeling bad or failure about yourself  - - -  Trouble concentrating - - -  Moving slowly or fidgety/restless - - -  Suicidal thoughts - - -  PHQ-9 Score - - -  Difficult doing work/chores - - -    Fall Risk  09/10/2019 08/20/2019 07/30/2019 05/07/2019 04/27/2019  Falls in the past year? 0 0 1 0 1  Comment - - - - -  Number falls in past yr: 0 0 1 0 1  Injury with Fall? 0 0 1 0 1  Comment - - - - bruising due to altered mental status, pt fine 3 weeks ago per daughter  Risk Factor Category  - - - - -  Risk for fall due to : Impaired mobility;Impaired balance/gait Impaired mobility;Impaired balance/gait;Orthopedic patient - - -  Follow up - - - - Falls evaluation completed     Allergies  Allergen Reactions   Other Shortness Of Breath, Itching and Other (See Comments)    Seasonal allergies- Runny nose, congestion, itchy eyes, and wheezing    Prior to Admission medications   Medication Sig Start Date End Date Taking? Authorizing Provider  acetaminophen (TYLENOL) 500 MG tablet Take  500-1,000 mg by mouth as needed for mild pain or headache (pain from eczema).    Yes [provider]  albuterol (PROAIR HFA) 108 (90 Base) MCG/ACT inhaler Inhale 1-2 puffs into the lungs every 6 (six) hours as needed for wheezing or shortness of breath. 07/30/19  Yes Rutherford Guys, MD  alendronate (FOSAMAX) 70 MG tablet TAKE 1 TABLET EVERY 7 DAYS. TAKE WITH A FULL GLASS OF WATER ON AN EMPTY STOMACH. 08/13/19  Yes Rutherford Guys, MD  beclomethasone (QVAR REDIHALER) 40 MCG/ACT inhaler Inhale 1 puff into the lungs 2 (two) times daily. 05/07/19  Yes Rutherford Guys, MD  Blood Pressure KIT Please check BP twice a day 03/25/19  Yes Rutherford Guys, MD  clopidogrel (PLAVIX) 75 MG tablet TAKE 1 TABLET ONCE DAILY. Patient taking differently: Take 75 mg by mouth daily.  06/07/19  Yes Rutherford Guys, MD  docusate sodium (COLACE) 100 MG capsule Take 1 capsule (100 mg total) by mouth daily as needed for mild constipation. 07/21/19  Yes Hongalgi, Lenis Dickinson, MD  furosemide (LASIX) 40 MG tablet Take 0.5 tablets (20 mg total) by mouth daily. 07/22/19  Yes Hongalgi, Lenis Dickinson, MD  metoprolol succinate (TOPROL-XL) 50 MG 24 hr tablet TAKE 1 TABLET DAILY WITH OR IMMEDIATELY FOLLOWING A MEAL. 08/13/19  Yes Rutherford Guys, MD  Misc. Devices [facility]) MISC Use walker whenever walking, standard walker 07/27/18  Yes Rutherford Guys, MD  omeprazole (PRILOSEC) 20 MG capsule Take 1 capsule (20 mg total) by mouth 2 (two) times daily before a meal. 07/30/19  Yes Rutherford Guys, MD  pravastatin (PRAVACHOL) 40 MG tablet TAKE 1 TABLET ONCE DAILY. Patient taking differently: Take 40 mg by mouth daily.  05/05/19  Yes Rutherford Guys, MD  traZODone (DESYREL) 50 MG tablet Take 1-2 tablets (50-100 mg total) by mouth at bedtime. 08/13/19  Yes Rutherford Guys, MD  triamcinolone (KENALOG) 0.025 % ointment Apply 1 application topically 2 (two) times daily.   Yes [provider]    Past Medical History:  Diagnosis Date     Allergy    seasonal  fall   Asthma    as child   Cancer ([place])    Basal cell carcinoma; multiple   Dyspnea    Eczema    Hyperlipidemia    Hypertension    MSSA bacteremia 2020   Osteoporosis    Pleural effusion due to CHF (congestive heart failure) ([place])    Stroke [facility])     Past Surgical History:  Procedure Laterality Date   CATARACT EXTRACTION, BILATERAL  06/29/2015   INTRAMEDULLARY (IM) NAIL INTERTROCHANTERIC Left 03/30/2017   Procedure: INTRAMEDULLARY (IM) NAIL INTERTROCHANTRIC;  Surgeon: [name], MD;  Location: [place];  Service: Orthopedics;  Laterality: Left;   INTRAMEDULLARY (IM) NAIL INTERTROCHANTERIC Right 11/12/2018   Procedure: INTRAMEDULLARY (IM) NAIL RIGHT INTERTROCHANTERIC HIP FRACTURE;  Surgeon: [name], MD;  Location: [place];  Service: Orthopedics;  Laterality: Right;   MOHS SURGERY     TEE WITHOUT CARDIOVERSION N/A 07/20/2019   Procedure: TRANSESOPHAGEAL ECHOCARDIOGRAM (TEE);  Surgeon: [name], MD;  Location: [facility] ENDOSCOPY;  Service: Cardiovascular;  Laterality: N/A;   TONSILLECTOMY  1940 maybe    Social History   Tobacco Use   Smoking status: Never Smoker   Smokeless tobacco: Never Used  Substance Use Topics   Alcohol use: No    Comment: occas    Family History  Problem Relation Age of Onset   Arthritis Mother    Heart disease Mother 22   Hypertension Father     Review of Systems  Constitutional: Negative for chills and fever.  HENT: Negative for congestion, ear pain, sinus pain and sore throat.   Cardiovascular: Negative for chest pain, palpitations and leg swelling.  Gastrointestinal: Negative for abdominal pain, nausea and vomiting.  Genitourinary: Negative for dysuria and hematuria.  Neurological: Negative for dizziness, tingling, sensory change, speech change, focal weakness and headaches.  Endo/Heme/Allergies: Negative for polydipsia. Does not bruise/bleed easily.  Psychiatric/Behavioral: Negative for  hallucinations. The patient does not have insomnia.   per hpi   OBJECTIVE:  Today's Vitals   09/10/19 1555  BP: 110/64  Pulse: 74  SpO2: 97%  Weight: 136 lb (61.7 kg)   Body mass index is 19.51 kg/m.  Wt Readings from Last 3 Encounters:  09/10/19 136 lb (61.7 kg)  08/24/19 150 lb (68 kg)  08/20/19 150 lb (68 kg)    Physical Exam Vitals signs and nursing note reviewed.  Constitutional:      Appearance: He is well-developed.  HENT:     Head: Normocephalic and atraumatic.  Eyes:     Extraocular Movements: Extraocular movements intact.     Conjunctiva/sclera: Conjunctivae normal.     Pupils: Pupils are equal, round, and reactive to light.  Neck:  Musculoskeletal: Neck supple.  Cardiovascular:     Rate and Rhythm: Normal rate and regular rhythm.     Heart sounds: No murmur. No friction rub. No gallop.   Pulmonary:     Effort: Pulmonary effort is normal.     Breath sounds: Normal breath sounds. No wheezing, rhonchi or rales.  Abdominal:     General: Bowel sounds are normal. There is no distension.     Palpations: Abdomen is soft. There is no mass.     Tenderness: There is no abdominal tenderness.  Musculoskeletal:     Right shoulder: He exhibits deformity. He exhibits normal range of motion, no tenderness, no bony tenderness and no swelling.     Right lower leg: No edema.     Left lower leg: No edema.  Lymphadenopathy:     Cervical: No cervical adenopathy.  Skin:    General: Skin is warm and dry.  Neurological:     Mental Status: He is alert and oriented to person, place, and time.     Motor: Weakness present.     Gait: Gait abnormal (in wheelchair).  Psychiatric:        Mood and Affect: Mood normal.        Behavior: Behavior normal.     No results found for this or any previous visit (from the past 24 hour(s)).  Dg Clavicle Right  Result Date: 09/10/2019 CLINICAL DATA:  Chronic pain. EXAM: RIGHT CLAVICLE - 2+ VIEWS COMPARISON:  None. FINDINGS: There is  widening of the acromioclavicular joint with a well corticated fragment inferior to the distal right clavicle. The distal right clavicle is slightly elevated with respect to the acromion. Visualized portion of the chest unremarkable. IMPRESSION: Findings in the right acromioclavicular joint may represent a remote AC joint separation and fracture. Electronically Signed   By: Lorin Picket M.D.   On: 09/10/2019 16:46   Dg Shoulder Right  Result Date: 09/10/2019 CLINICAL DATA:  Right shoulder pain, no injury. EXAM: RIGHT SHOULDER - 2+ VIEW COMPARISON:  None. FINDINGS: There is widening of the right acromioclavicular joint with elevation of the distal right clavicle. Well corticated osseous fragment is seen adjacent to the distal right clavicle. Glenohumeral joint is intact. Visualized portion of the right chest is unremarkable. IMPRESSION: Findings in the right acromioclavicular joint suggest a prior AC joint separation and possible fracture. Electronically Signed   By: Lorin Picket M.D.   On: 09/10/2019 16:47     ASSESSMENT and PLAN  1. Right shoulder pain, unspecified chronicity 2. Pain of right clavicle Xray shows possible old trauma, shoulder fully functional, mostly visible of recent due to recent sudden weight loss - DG Clavicle Right; Future  3. Abnormal weight loss  14 lbs weigh loss in less than a month. No apparent cause. Recent chest CTA was not concerning. Labs pending. CT abd/pelvis to r/o occult neoplasm. Also has FNA of thyroid nodule pending.  - TSH - CT ABDOMEN PELVIS W WO CONTRAST; Future - Basic Metabolic Panel  4. Screening for prostate cancer - PSA  5. Benign prostatic hyperplasia with urinary frequency - PSA  Return for after studies/fna.    Rutherford Guys, MD Primary Care at [place], [place] [address] Ph.  [phone] Fax [phone]

## 2019-09-10 NOTE — Patient Instructions (Signed)
° ° ° °  If you have lab work done today you will be contacted with your lab results within the next 2 weeks.  If you have not heard from us then please contact us. The fastest way to get your results is to register for My Chart. ° ° °IF you received an x-ray today, you will receive an invoice from [facility]. Please contact [facility] at [phone] with questions or concerns regarding your invoice.  ° °IF you received labwork today, you will receive an invoice from [facility]. Please contact [facility] at [phone] with questions or concerns regarding your invoice.  ° °Our billing staff will not be able to assist you with questions regarding bills from these companies. ° °You will be contacted with the lab results as soon as they are available. The fastest way to get your results is to activate your My Chart account. Instructions are located on the last page of this paperwork. If you have not heard from us regarding the results in 2 weeks, please contact this office. °  ° ° ° °

## 2019-09-13 ENCOUNTER — Other Ambulatory Visit: Payer: Self-pay | Admitting: Family Medicine

## 2019-09-13 NOTE — Telephone Encounter (Signed)
Requested medication (s) are due for refill today: yes  Requested medication (s) are on the active medication list: yes  Last refill: 08/03/2019  Future visit scheduled: yes  Notes to clinic:  Review for refill   Requested Prescriptions  Pending Prescriptions Disp Refills   alendronate (FOSAMAX) 70 MG tablet [Pharmacy Med Name: ALENDRONATE SODIUM 70 MG TAB] 4 tablet 0    Sig: TAKE 1 TABLET EVERY 7 DAYS. TAKE WITH A FULL GLASS OF WATER ON AN EMPTY STOMACH.     Endocrinology:  Bisphosphonates Failed - 09/13/2019  9:24 AM      Failed - Vitamin D in normal range and within 360 days    Vit D, 25-Hydroxy  Date Value Ref Range Status  03/30/2017 36.2 30.0 - 100.0 ng/mL Final    Comment:    (NOTE) Vitamin D deficiency has been defined by the [place] practice guideline as a level of serum 25-OH vitamin D less than 20 ng/mL (1,2). The Endocrine Society went on to further define vitamin D insufficiency as a level between 21 and 29 ng/mL (2). 1. IOM (Institute of Medicine). 2010. Dietary reference   intakes for calcium and D. [place]: The   [employer]. 2. Holick MF, Binkley Slayton, Bischoff-Ferrari HA, et al.   Evaluation, treatment, and prevention of vitamin D   deficiency: an Endocrine Society clinical practice   guideline. JCEM. 2011 Jul; 96(7):1911-30. Performed At: [facility] [place], [place] [id number] Lindon Romp MD [id number] - Ca in normal range and within 360 days    Calcium  Date Value Ref Range Status  07/30/2019 8.8 8.6 - 10.2 mg/dL Final         Passed - Valid encounter within last 12 months    Recent Outpatient Visits          3 days ago Right shoulder pain, unspecified chronicity   [facility], Lilia Argue, MD   3 weeks ago Gastroesophageal reflux disease, unspecified whether esophagitis present   [facility], Lilia Argue, MD   1 month ago Wheezing   [facility], Lilia Argue, MD   4 months ago Restrictive lung disease   [facility], Lilia Argue, MD   4 months ago Disorientation   [facility] at [place], MD      Future Appointments            In 3 weeks Carlis Abbott Venita Sheffield, DO [facility]   In 2 months Rutherford Guys, MD [facility] at [place], [facility]            furosemide (LASIX) 40 MG tablet [Pharmacy Med Name: FUROSEMIDE 40 MG TABLET] 30 tablet 0    Sig: TAKE 1/2 TABLET EVERY DAY.     Cardiovascular:  Diuretics - Loop Passed - 09/13/2019  9:24 AM      Passed - K in normal range and within 360 days    Potassium  Date Value Ref Range Status  07/30/2019 4.2 3.5 - 5.2 mmol/L Final         Passed - Ca in normal range and within 360 days    Calcium  Date Value Ref Range Status  07/30/2019 8.8 8.6 - 10.2 mg/dL Final         Passed - Na in normal range and within 360  days    Sodium  Date Value Ref Range Status  07/30/2019 139 134 - 144 mmol/L Final         Passed - Cr in normal range and within 360 days    Creat  Date Value Ref Range Status  09/25/2016 1.18 (H) 0.70 - 1.11 mg/dL Final    Comment:      For patients > or = 83 years of age: The upper reference limit for Creatinine is approximately 13% higher for people identified as African-American.      Creatinine, Ser  Date Value Ref Range Status  07/30/2019 1.05 0.76 - 1.27 mg/dL Final         Passed - Last BP in normal range    BP Readings from Last 1 Encounters:  09/10/19 110/64         Passed - Valid encounter within last 6 months    Recent Outpatient Visits          3 days ago Right shoulder pain, unspecified chronicity   [facility], Lilia Argue, MD   3 weeks ago Gastroesophageal reflux disease, unspecified whether esophagitis present   [facility], Lilia Argue, MD   1 month ago Wheezing   [facility], Lilia Argue, MD   4  months ago Restrictive lung disease   [facility], Lilia Argue, MD   4 months ago Disorientation   [facility] at [place], MD      Future Appointments            In 3 weeks Carlis Abbott Venita Sheffield, DO [place]   In 2 months Rutherford Guys, MD Primary Care at [place], [facility]           Signed Prescriptions Disp Refills   clopidogrel (PLAVIX) 75 MG tablet 30 tablet 0    Sig: TAKE 1 TABLET ONCE DAILY.     Hematology: Antiplatelets - clopidogrel Failed - 09/13/2019  9:24 AM      Failed - Evaluate AST, ALT within 2 months of therapy initiation.      Passed - ALT in normal range and within 360 days    ALT  Date Value Ref Range Status  04/30/2019 13 0 - 44 U/L Final         Passed - AST in normal range and within 360 days    AST  Date Value Ref Range Status  04/30/2019 20 15 - 41 U/L Final         Passed - HCT in normal range and within 180 days    HCT  Date Value Ref Range Status  07/20/2019 39.3 39.0 - 52.0 % Final   HCT, POC  Date Value Ref Range Status  07/30/2019 37.7 29 - 41 % Final   Hematocrit  Date Value Ref Range Status  03/11/2018 43.6 37.5 - 51.0 % Final         Passed - HGB in normal range and within 180 days    Hemoglobin  Date Value Ref Range Status  07/30/2019 12.4 11 - 14.6 g/dL Final  07/20/2019 13.0 13.0 - 17.0 g/dL Final  03/11/2018 14.3 13.0 - 17.7 g/dL Final         Passed - PLT in normal range and within 180 days    Platelets  Date Value Ref Range Status  07/20/2019 266 150 - 400 K/uL Final  03/11/2018 198 150 - 379 x10E3/uL Final  Platelet Count, POC  Date Value Ref Range Status  07/30/2019 181 142 - 424 K/uL Final         Passed - Valid encounter within last 6 months    Recent Outpatient Visits          3 days ago Right shoulder pain, unspecified chronicity   [facility], Lilia Argue, MD   3 weeks ago Gastroesophageal reflux disease, unspecified whether esophagitis present    [facility], Lilia Argue, MD   1 month ago Wheezing   [facility], Lilia Argue, MD   4 months ago Restrictive lung disease   [facility], Lilia Argue, MD   4 months ago Disorientation   [facility], Arlie Solomons, MD      Future Appointments            In 3 weeks Carlis Abbott Venita Sheffield, DO [facility]   In 2 months Rutherford Guys, MD Primary Care at [place], [facility]

## 2019-09-14 ENCOUNTER — Other Ambulatory Visit (HOSPITAL_COMMUNITY): Payer: Self-pay | Admitting: *Deleted

## 2019-09-14 DIAGNOSIS — R1310 Dysphagia, unspecified: Secondary | ICD-10-CM

## 2019-09-14 DIAGNOSIS — K219 Gastro-esophageal reflux disease without esophagitis: Secondary | ICD-10-CM

## 2019-09-14 NOTE — Telephone Encounter (Signed)
Wasn't sure if I could send the fosamax

## 2019-09-16 ENCOUNTER — Telehealth: Payer: Self-pay

## 2019-09-16 ENCOUNTER — Telehealth: Payer: Self-pay | Admitting: Family Medicine

## 2019-09-16 ENCOUNTER — Other Ambulatory Visit: Payer: Self-pay | Admitting: Family Medicine

## 2019-09-16 DIAGNOSIS — I130 Hypertensive heart and chronic kidney disease with heart failure and stage 1 through stage 4 chronic kidney disease, or unspecified chronic kidney disease: Secondary | ICD-10-CM | POA: Diagnosis not present

## 2019-09-16 DIAGNOSIS — R634 Abnormal weight loss: Secondary | ICD-10-CM

## 2019-09-16 DIAGNOSIS — N189 Chronic kidney disease, unspecified: Secondary | ICD-10-CM | POA: Diagnosis not present

## 2019-09-16 DIAGNOSIS — R7881 Bacteremia: Secondary | ICD-10-CM | POA: Diagnosis not present

## 2019-09-16 DIAGNOSIS — B9561 Methicillin susceptible Staphylococcus aureus infection as the cause of diseases classified elsewhere: Secondary | ICD-10-CM | POA: Diagnosis not present

## 2019-09-16 DIAGNOSIS — L209 Atopic dermatitis, unspecified: Secondary | ICD-10-CM | POA: Diagnosis not present

## 2019-09-16 DIAGNOSIS — I5032 Chronic diastolic (congestive) heart failure: Secondary | ICD-10-CM | POA: Diagnosis not present

## 2019-09-16 NOTE — Telephone Encounter (Signed)
Orders approves for [name] PT

## 2019-09-16 NOTE — Telephone Encounter (Signed)
Betsy with [facility] called and said that she is needing verbal orders for [name] to continue physical therapy for 2 times a week for 4 weeks and 1 time a week for 1 week. Her call back number is [phone]. She said if she does not answer, ya'll can leave a VM.

## 2019-09-17 ENCOUNTER — Ambulatory Visit (INDEPENDENT_AMBULATORY_CARE_PROVIDER_SITE_OTHER): Payer: Medicare Other | Admitting: Podiatry

## 2019-09-17 ENCOUNTER — Other Ambulatory Visit: Payer: Self-pay

## 2019-09-17 ENCOUNTER — Encounter: Payer: Self-pay | Admitting: Podiatry

## 2019-09-17 ENCOUNTER — Ambulatory Visit (INDEPENDENT_AMBULATORY_CARE_PROVIDER_SITE_OTHER): Payer: Medicare Other

## 2019-09-17 DIAGNOSIS — M79676 Pain in unspecified toe(s): Secondary | ICD-10-CM | POA: Diagnosis not present

## 2019-09-17 DIAGNOSIS — D689 Coagulation defect, unspecified: Secondary | ICD-10-CM | POA: Diagnosis not present

## 2019-09-17 DIAGNOSIS — L97522 Non-pressure chronic ulcer of other part of left foot with fat layer exposed: Secondary | ICD-10-CM | POA: Diagnosis not present

## 2019-09-17 DIAGNOSIS — M79675 Pain in left toe(s): Secondary | ICD-10-CM

## 2019-09-17 DIAGNOSIS — B351 Tinea unguium: Secondary | ICD-10-CM

## 2019-09-17 NOTE — Telephone Encounter (Signed)
Called to give verbal orders for PT, LVM

## 2019-09-18 MED ORDER — DOXYCYCLINE HYCLATE 100 MG PO TABS: 100.0000 mg | ORAL_TABLET | Freq: Two times a day (BID) | ORAL | 0 refills | Status: AC

## 2019-09-21 ENCOUNTER — Encounter: Payer: Self-pay | Admitting: Podiatry

## 2019-09-21 DIAGNOSIS — L209 Atopic dermatitis, unspecified: Secondary | ICD-10-CM | POA: Diagnosis not present

## 2019-09-21 DIAGNOSIS — I5032 Chronic diastolic (congestive) heart failure: Secondary | ICD-10-CM | POA: Diagnosis not present

## 2019-09-21 DIAGNOSIS — R7881 Bacteremia: Secondary | ICD-10-CM | POA: Diagnosis not present

## 2019-09-21 DIAGNOSIS — E041 Nontoxic single thyroid nodule: Secondary | ICD-10-CM | POA: Diagnosis not present

## 2019-09-21 DIAGNOSIS — J4520 Mild intermittent asthma, uncomplicated: Secondary | ICD-10-CM | POA: Diagnosis not present

## 2019-09-21 DIAGNOSIS — Z9981 Dependence on supplemental oxygen: Secondary | ICD-10-CM | POA: Diagnosis not present

## 2019-09-21 DIAGNOSIS — N189 Chronic kidney disease, unspecified: Secondary | ICD-10-CM | POA: Diagnosis not present

## 2019-09-21 DIAGNOSIS — Z8673 Personal history of transient ischemic attack (TIA), and cerebral infarction without residual deficits: Secondary | ICD-10-CM | POA: Diagnosis not present

## 2019-09-21 DIAGNOSIS — I130 Hypertensive heart and chronic kidney disease with heart failure and stage 1 through stage 4 chronic kidney disease, or unspecified chronic kidney disease: Secondary | ICD-10-CM | POA: Diagnosis not present

## 2019-09-21 DIAGNOSIS — M810 Age-related osteoporosis without current pathological fracture: Secondary | ICD-10-CM | POA: Diagnosis not present

## 2019-09-21 DIAGNOSIS — R1310 Dysphagia, unspecified: Secondary | ICD-10-CM | POA: Diagnosis not present

## 2019-09-21 DIAGNOSIS — B9561 Methicillin susceptible Staphylococcus aureus infection as the cause of diseases classified elsewhere: Secondary | ICD-10-CM | POA: Diagnosis not present

## 2019-09-21 DIAGNOSIS — Z85828 Personal history of other malignant neoplasm of skin: Secondary | ICD-10-CM | POA: Diagnosis not present

## 2019-09-21 DIAGNOSIS — Z8781 Personal history of (healed) traumatic fracture: Secondary | ICD-10-CM | POA: Diagnosis not present

## 2019-09-21 NOTE — Progress Notes (Signed)
Subjective:  Patient ID: [name], male    DOB: 19-Dec-1931,  MRN: [id number]  No chief complaint on file.   85 y.o. male presents for wound care.  Patient presents with left distal tip hallux ulceration.  Patient states that it has been going on for a week.  Patient states the skin has been peeling.  It hurts when it is elevated and he is ambulating.  This is primarily to the left great toe.  Patient is also here as a secondary complaint of thickened elongated mycotic toenails that needs debridement.  These toenails have also been painful.  He has not seek any care for the wound treatment.  He denies any nausea fever chills vomiting.  He denies any other acute complaints.   Review of Systems: Negative except as noted in the HPI. Denies N/V/F/Ch.  Past Medical History:  Diagnosis Date  . Allergy    seasonal  fall  . Asthma    as child  . Cancer ([place])    Basal cell carcinoma; multiple  . Dyspnea   . Eczema   . Hyperlipidemia   . Hypertension   . MSSA bacteremia 2020  . Osteoporosis   . Pleural effusion due to CHF (congestive heart failure) ([place])   . Stroke [facility])     Current Outpatient Medications:  .  acetaminophen (TYLENOL) 500 MG tablet, Take 500-1,000 mg by mouth as needed for mild pain or headache (pain from eczema). , Disp: , Rfl:  .  albuterol (PROAIR HFA) 108 (90 Base) MCG/ACT inhaler, Inhale 1-2 puffs into the lungs every 6 (six) hours as needed for wheezing or shortness of breath., Disp: 8 g, Rfl: 3 .  alendronate (FOSAMAX) 70 MG tablet, TAKE 1 TABLET EVERY 7 DAYS. TAKE WITH A FULL GLASS OF WATER ON AN EMPTY STOMACH., Disp: 4 tablet, Rfl: 0 .  beclomethasone (QVAR REDIHALER) 40 MCG/ACT inhaler, Inhale 1 puff into the lungs 2 (two) times daily., Disp: 10.6 g, Rfl: 3 .  Blood Pressure KIT, Please check BP twice a day, Disp: 1 each, Rfl: 0 .  clopidogrel (PLAVIX) 75 MG tablet, TAKE 1 TABLET ONCE DAILY., Disp: 30 tablet, Rfl: 0 .  docusate sodium (COLACE) 100 MG capsule,  Take 1 capsule (100 mg total) by mouth daily as needed for mild constipation., Disp: , Rfl:  .  doxycycline (VIBRA-TABS) 100 MG tablet, Take 1 tablet (100 mg total) by mouth 2 (two) times daily for 7 days., Disp: 14 tablet, Rfl: 0 .  furosemide (LASIX) 40 MG tablet, TAKE 1/2 TABLET EVERY DAY., Disp: 30 tablet, Rfl: 0 .  metoprolol succinate (TOPROL-XL) 50 MG 24 hr tablet, TAKE 1 TABLET DAILY WITH OR IMMEDIATELY FOLLOWING A MEAL., Disp: 90 tablet, Rfl: 0 .  Misc. Devices (WALKER) MISC, Use walker whenever walking, standard walker, Disp: 1 each, Rfl: 0 .  omeprazole (PRILOSEC) 20 MG capsule, Take 1 capsule (20 mg total) by mouth 2 (two) times daily before a meal., Disp: 60 capsule, Rfl: 3 .  pravastatin (PRAVACHOL) 40 MG tablet, TAKE 1 TABLET ONCE DAILY. (Patient taking differently: Take 40 mg by mouth daily. ), Disp: 90 tablet, Rfl: 0 .  traZODone (DESYREL) 50 MG tablet, Take 1-2 tablets (50-100 mg total) by mouth at bedtime., Disp: 180 tablet, Rfl: 1 .  triamcinolone (KENALOG) 0.025 % ointment, Apply 1 application topically 2 (two) times daily., Disp: , Rfl:   Social History   Tobacco Use  Smoking Status Never Smoker  Smokeless Tobacco Never Used  Allergies  Allergen Reactions  . Other Shortness Of Breath, Itching and Other (See Comments)    Seasonal allergies- Runny nose, congestion, itchy eyes, and wheezing   Objective:  There were no vitals filed for this visit. There is no height or weight on file to calculate BMI. Constitutional Well developed. Well nourished.  Vascular Dorsalis pedis pulses palpable bilaterally. Posterior tibial pulses palpable bilaterally. Capillary refill normal to all digits.  No cyanosis or clubbing noted. Pedal hair growth normal.  Neurologic Normal speech. Oriented to person, place, and time. Protective sensation absent  Dermatologic Wound Location: Left distal hallux tip ulceration Wound Base: Granular/Healthy Peri-wound: Calloused Exudate:  Scant/small amount serous sanguinous Wound Measurements: -See below Nail Exam: Pt has thick disfigured discolored nails with subungual debris noted bilateral entire nail hallux through fifth toenails  Orthopedic: No pain to palpation either foot.   Radiographs: 2 views of skeletally mature adult: No cortical irregularity noted with osteomyelitis.  No soft tissue emphysema noted.  Severe arthritic noted of the first MPJ and IPJ of the hallux.  No foreign body noted.  Mild soft tissue swelling noted around the left hallux circumferentially.  Assessment:   1. Toe pain, left    Plan:  Patient was evaluated and treated and all questions answered.  Ulcer left hallux distal tip ulceration -Debridement as below. -Dressed with triple antibiotic, DSD. -Continue off-loading with surgical shoe.  Procedure: Excisional Debridement of Wound Rationale: Removal of non-viable soft tissue from the wound to promote healing.  Anesthesia: none Pre-Debridement Wound Measurements: 0.6 cm x 0.6 cm x 0.1 cm  Post-Debridement Wound Measurements: 0.5 cm x 0.5 cm x 0.1 cm  Type of Debridement: Sharp Excisional Tissue Removed: Non-viable soft tissue Depth of Debridement: subcutaneous tissue. Technique: Sharp excisional debridement to bleeding, viable wound base.  Dressing: Dry, sterile, compression dressing. Disposition: Patient tolerated procedure well. Patient to return in 1 week for follow-up.  Return in about 1 week (around 09/24/2019) for Toe Ulcer Xray Next visti.   Onychomycosis with pain  -Nails palliatively debrided as below. -Educated on self-care  Procedure: Nail Debridement Rationale: pain  Type of Debridement: manual, sharp debridement. Instrumentation: Nail nipper, rotary burr. Number of Nails: 10  Procedures and Treatment: Consent by patient was obtained for treatment procedures. The patient understood the discussion of treatment and procedures well. All questions were answered  thoroughly reviewed. Debridement of mycotic and hypertrophic toenails, 1 through 5 bilateral and clearing of subungual debris. No ulceration, no infection noted.  Return Visit-Office Procedure: Patient instructed to return to the office for a follow up visit 3 months for continued evaluation and treatment.  Boneta Lucks, DPM    Return in about 1 week (around 09/24/2019) for Toe Ulcer Xray Next visti.

## 2019-09-22 ENCOUNTER — Ambulatory Visit (HOSPITAL_COMMUNITY)
Admission: RE | Admit: 2019-09-22 | Discharge: 2019-09-22 | Disposition: A | Discharge status: Home / Self Care | Payer: Medicare Other | Source: Ambulatory Visit | Attending: Family Medicine | Admitting: Family Medicine

## 2019-09-22 ENCOUNTER — Other Ambulatory Visit: Payer: Self-pay

## 2019-09-22 ENCOUNTER — Other Ambulatory Visit: Payer: Self-pay | Admitting: Podiatry

## 2019-09-22 DIAGNOSIS — R062 Wheezing: Secondary | ICD-10-CM | POA: Insufficient documentation

## 2019-09-22 DIAGNOSIS — R1310 Dysphagia, unspecified: Secondary | ICD-10-CM | POA: Diagnosis not present

## 2019-09-22 DIAGNOSIS — K219 Gastro-esophageal reflux disease without esophagitis: Secondary | ICD-10-CM | POA: Insufficient documentation

## 2019-09-22 DIAGNOSIS — D689 Coagulation defect, unspecified: Secondary | ICD-10-CM

## 2019-09-22 DIAGNOSIS — Z8673 Personal history of transient ischemic attack (TIA), and cerebral infarction without residual deficits: Secondary | ICD-10-CM | POA: Diagnosis not present

## 2019-09-22 NOTE — Therapy (Signed)
Modified Barium Swallow Progress Note  Patient Details  Name: [name] MRN: [id number] Date of Birth: 10/02/1932  Today's Date: 09/22/2019  Modified Barium Swallow completed.  Full report located under Chart Review in the Imaging Section.  Brief recommendations include the following:  Clinical Impression Pt presents with mild-moderate pharyngeal dysphagia, characterized by delayed swallow reflex across consistencies, at the vallecular sinus. Poor epiglottic inversion is also noted across consistencies, and frequently results in penetration during the swallow. Vallecular residue is seen across consistencies, which resulted in subepiglottic pentration, to the level of the vocal folds if no throat clear was completed. Cued throat clear effectively removed penetrate, however, pt is at significantly increased risk of aspiration on any consistency. Silent aspiration was seen during the swallow of thin liquids via cup sip.   Recommend continuing with soft/chopped solids and nectar thick liquid with intermittent throat clear dry swallow throughout the meal. Home health speech therapy is also recommended to provide education regarding safe swallow, and home exercise program to maximize oropharyngeal musculature to minimize aspiration risk. MBS results and Safe swallow precautions were reviewed with pt and caregiver after the examination. Written strategies were provided.    Swallow Evaluation Recommendations  SLP Diet Recommendations: Dysphagia 2 (Fine chop) solids;Nectar thick liquid   Liquid Administration via: Cup;No straw   Medication Administration: Whole meds with liquid   Supervision: Patient able to self feed;Full assist for feeding;Full supervision/cueing for compensatory strategies   Compensations: Minimize environmental distractions;Slow rate;Small sips/bites;Multiple dry swallows after each bite/sip;Clear throat after each swallow   Postural Changes: Remain semi-upright after after  feeds/meals (Comment);Seated upright at 90 degrees   Oral Care Recommendations: Oral care QID        Shonna Chock 09/22/2019,5:03 PM

## 2019-09-28 ENCOUNTER — Telehealth: Payer: Self-pay | Admitting: Family Medicine

## 2019-09-28 ENCOUNTER — Other Ambulatory Visit: Payer: Medicare Other

## 2019-09-28 NOTE — Telephone Encounter (Signed)
Pt is scheduled for MRI with contrast. Pt's daughter received notice that she needs to pick up an oral prescription prior to appointment tomorrow. Daughter would like a cb to address letter she got and what she needs to do. Requesting cb today

## 2019-09-29 ENCOUNTER — Ambulatory Visit
Admission: RE | Admit: 2019-09-29 | Discharge: 2019-09-29 | Disposition: A | Discharge status: Home / Self Care | Payer: Medicare Other | Source: Ambulatory Visit | Attending: Family Medicine | Admitting: Family Medicine

## 2019-09-29 ENCOUNTER — Ambulatory Visit: Payer: Medicare Other | Admitting: Podiatry

## 2019-09-29 ENCOUNTER — Other Ambulatory Visit (HOSPITAL_COMMUNITY)
Admission: RE | Admit: 2019-09-29 | Discharge: 2019-09-29 | Disposition: A | Discharge status: Home / Self Care | Payer: Medicare Other | Source: Ambulatory Visit | Attending: Otolaryngology | Admitting: Otolaryngology

## 2019-09-29 ENCOUNTER — Ambulatory Visit
Admission: RE | Admit: 2019-09-29 | Discharge: 2019-09-29 | Disposition: A | Discharge status: Home / Self Care | Payer: Medicare Other | Source: Ambulatory Visit | Attending: Otolaryngology | Admitting: Otolaryngology

## 2019-09-29 DIAGNOSIS — E041 Nontoxic single thyroid nodule: Secondary | ICD-10-CM | POA: Diagnosis not present

## 2019-09-29 DIAGNOSIS — K5900 Constipation, unspecified: Secondary | ICD-10-CM | POA: Diagnosis not present

## 2019-09-29 DIAGNOSIS — R634 Abnormal weight loss: Secondary | ICD-10-CM

## 2019-09-29 DIAGNOSIS — E079 Disorder of thyroid, unspecified: Secondary | ICD-10-CM

## 2019-09-29 DIAGNOSIS — K429 Umbilical hernia without obstruction or gangrene: Secondary | ICD-10-CM | POA: Diagnosis not present

## 2019-09-29 MED ORDER — IOPAMIDOL (ISOVUE-300) INJECTION 61%
100.0000 mL | Freq: Once | INTRAVENOUS | Status: AC | PRN
  Administered 2019-09-29: 100 mL via INTRAVENOUS

## 2019-10-01 ENCOUNTER — Telehealth: Payer: Self-pay | Admitting: Student

## 2019-10-01 LAB — CYTOLOGY - NON PAP

## 2019-10-01 NOTE — Progress Notes (Signed)
error 

## 2019-10-05 ENCOUNTER — Ambulatory Visit (INDEPENDENT_AMBULATORY_CARE_PROVIDER_SITE_OTHER): Payer: Medicare Other | Admitting: Critical Care Medicine

## 2019-10-05 ENCOUNTER — Encounter: Payer: Self-pay | Admitting: Critical Care Medicine

## 2019-10-05 ENCOUNTER — Other Ambulatory Visit: Payer: Self-pay

## 2019-10-05 VITALS — BP 120/66 | HR 73 | Temp 97.1°F | Ht 66.0 in | Wt 140.5 lb

## 2019-10-05 DIAGNOSIS — J4530 Mild persistent asthma, uncomplicated: Secondary | ICD-10-CM | POA: Diagnosis not present

## 2019-10-05 DIAGNOSIS — R1313 Dysphagia, pharyngeal phase: Secondary | ICD-10-CM

## 2019-10-05 DIAGNOSIS — Z9189 Other specified personal risk factors, not elsewhere classified: Secondary | ICD-10-CM

## 2019-10-05 NOTE — Patient Instructions (Addendum)
Thank you for visiting Dr. Carlis Abbott at [facility]. We recommend the following:  Merry Christmas!  Keep following your swallow precautions: chopped/soft solids and nectar thick liquid with intermittent throat clearing and a dry swallow between bites.  Eat slowly, take small bites.  Recommended drinking from a cup not straw.  Oral care 4 times daily.     Return if symptoms worsen or fail to improve.    Please do your part to reduce the spread of COVID-19.

## 2019-10-05 NOTE — Progress Notes (Signed)
Synopsis: Referred in October 2020 for wheezing by Rutherford Guys, MD.  Subjective:   PATIENT ID: [name] GENDER: male DOB: 06/10/1932, MRN: [id number]  Chief Complaint  Patient presents with   Follow-up    Patient is here for follow up for thyroid mass. Patient went to ENT last week. Wheezing and swollowing have got better since last visit.    [name] is an 85 year old gentleman who presents for follow-up.  He is accompanied by his caregiver [name].  He has a history of childhood asthma and has been started on steroid inhalers several months ago.  He had notable wheezing after eating.  Swallow evaluation recently confirmed frequent aspiration.  He has previously had a hospitalization for pneumonia in 2019. He has had other hospitalizations for decompensated heart failure and bacteremia from cellulitis.   Recent MBSS with SLP demonstrated pharyngeal dysphagia- mild to moderate. Delayed swallow reflex with all consistencies. Deemed to be high aspiration risk with all consistencies. Silent aspiration of liquids. SLP recs: (Dysphagia 2) chopped/soft solids and nectar thick liquid with intermittent throat clearing and a dry swallow between bites.  Eat slowly, take small bites.  Recommended home health SLP.  Recommended drinking from a cup not straw.  Oral care 4 times daily.  Since adopting the dysphagia precautions, he has had less wheezing or symptoms of aspiration.  He continues to use his Qvar twice a day.  He had his thyroid biopsied few days ago, with path demonstrating simple cyst, was maximally drained during the procedure.     OV 08/24/2019: Mr. [name] is an 85 year old gentleman with a history of childhood asthma who presents for evaluation of asthma at the request of his primary care provider.  He presents with his caregiver [name], who has been working with him for 4 years.  He has 24-hour caregivers at home.  He was recently hospitalized with MSSA bacteremia due to cellulitis  from uncontrolled eczema in his legs.  At that time he had decompensated heart failure, significant lower extremity bilateral edema, and bilateral pleural effusions.  He was diuresed while he was admitted, and completed his antibiotic regimen with linezolid.  While hospitalized he was diagnosed with a >5 cm left thyroid mass, which has not yet been biopsied.  His daughter is very eager to know the results of this biopsy.  He had asthma as a child, and about 4 months ago was started on 2 L of nocturnal oxygen.  About 2.5 months ago he was started on Qvar twice daily with albuterol as needed.  Prior to his recent hospitalization he was requiring albuterol frequently throughout the day, increased from seldom being needed.  He has been using his albuterol since discharge, but his wheezing is most notable after eating.  He has a very wet sounding wheeze, which improves on its own over about an hour after eating. He has frequent eye tearing & coughing when eating and frequent throat clearing after eating.  There is concern that when he eats he is aspirating.  His family is concerned that his omeprazole is not controlling his aspiration; he has a known hiatal hernia.  He had an episode of pneumonia requiring hospitalization in 2019.  Swallow study has been ordered, but has not been completed yet.  His eczema has been managed recently by dermatology.  He recently had a biopsy, and he has been having aggressive topical treatments, which have significantly improved his skin.  His diffuse itching is less frequent.  He has had frequent  falls in the past, including bilateral hip fractures.  He ambulates with a walker at home, and has not fallen recently.  He has home health physical therapy.   Past Medical History:  Diagnosis Date   Allergy    seasonal  fall   Asthma    as child   Cancer ([place])    Basal cell carcinoma; multiple   Dyspnea    Eczema    Hyperlipidemia    Hypertension    MSSA bacteremia  2020   Osteoporosis    Pleural effusion due to CHF (congestive heart failure) ([place])    Stroke [facility])      Family History  Problem Relation Age of Onset   Arthritis Mother    Heart disease Mother 68   Hypertension Father      Past Surgical History:  Procedure Laterality Date   CATARACT EXTRACTION, BILATERAL  06/29/2015   INTRAMEDULLARY (IM) NAIL INTERTROCHANTERIC Left 03/30/2017   Procedure: INTRAMEDULLARY (IM) NAIL INTERTROCHANTRIC;  Surgeon: [name], MD;  Location: [place];  Service: Orthopedics;  Laterality: Left;   INTRAMEDULLARY (IM) NAIL INTERTROCHANTERIC Right 11/12/2018   Procedure: INTRAMEDULLARY (IM) NAIL RIGHT INTERTROCHANTERIC HIP FRACTURE;  Surgeon: [name], MD;  Location: [place];  Service: Orthopedics;  Laterality: Right;   MOHS SURGERY     TEE WITHOUT CARDIOVERSION N/A 07/20/2019   Procedure: TRANSESOPHAGEAL ECHOCARDIOGRAM (TEE);  Surgeon: [name], MD;  Location: [facility] ENDOSCOPY;  Service: Cardiovascular;  Laterality: N/A;   TONSILLECTOMY  1940 maybe    Social History   Socioeconomic History   Marital status: Married    Spouse name: Not on file   Number of children: 2   Years of education: Not on file   Highest education level: Not on file  Occupational History   Occupation: retired  Scientist, product/process development strain: Not on file   Food insecurity    Worry: Not on file    Inability: Not on Lexicographer needs    Medical: Not on file    Non-medical: Not on file  Tobacco Use   Smoking status: Never Smoker   Smokeless tobacco: Never Used  Substance and Sexual Activity   Alcohol use: No    Comment: occas   Drug use: No   Sexual activity: Not Currently  Lifestyle   Physical activity    Days per week: Not on file    Minutes per session: Not on file   Stress: Not on file  Relationships   Social connections    Talks on phone: Not on file    Gets together: Not on file    Attends religious service:  Not on file    Active member of club or organization: Not on file    Attends meetings of clubs or organizations: Not on file    Relationship status: Not on file   Intimate partner violence    Fear of current or ex partner: No    Emotionally abused: No    Physically abused: No    Forced sexual activity: No  Other Topics Concern   Not on file  Social History Narrative   Marital status: divorced; good friend with ex-wife; not dating      Children: 2 children; 4 grandchildren; no gg      Lives:  Alone in house; daughter six blocks away      Employment: college professor; retired in 1997; history; Engineer, manufacturing      Tobacco:  Never  Alcohol: tequila loves but won't buy it.      Exercise:  As much as can; previous competitive biking.      ADLs: quit mowing grass in 2017; no assistant devices; cooks and cleans and pays bills.  No driving; HAS NEVER DRIVEN; has always ridden bike.  Daughter takes patient to grocery store and to appointments.  Daughter drives patient to grocery store.      Advanced Directives:  No living; DNR; do not resuscitate.  HCPOA: [name] Cummings     Allergies  Allergen Reactions   Other Shortness Of Breath, Itching and Other (See Comments)    Seasonal allergies- Runny nose, congestion, itchy eyes, and wheezing     Immunization History  Administered Date(s) Administered   Fluad Quad(high Dose 65+) 08/24/2019   Influenza, High Dose Seasonal PF 07/27/2018   Influenza, Seasonal, Injecte, Preservative Fre 10/14/2012   Influenza,inj,Quad PF,6+ Mos 12/07/2014, 08/11/2015, 09/11/2016, 08/25/2017   Pneumococcal Conjugate-13 06/03/2014   Pneumococcal Polysaccharide-23 04/14/2007, 09/22/2015   Tdap 04/14/2007, 09/25/2016   Zoster 12/06/2011    Outpatient Medications Prior to Visit  Medication Sig Dispense Refill   acetaminophen (TYLENOL) 500 MG tablet Take 500-1,000 mg by mouth as needed for mild pain or headache (pain from eczema).      albuterol  (PROAIR HFA) 108 (90 Base) MCG/ACT inhaler Inhale 1-2 puffs into the lungs every 6 (six) hours as needed for wheezing or shortness of breath. 8 g 3   alendronate (FOSAMAX) 70 MG tablet TAKE 1 TABLET EVERY 7 DAYS. TAKE WITH A FULL GLASS OF WATER ON AN EMPTY STOMACH. 4 tablet 0   beclomethasone (QVAR REDIHALER) 40 MCG/ACT inhaler Inhale 1 puff into the lungs 2 (two) times daily. 10.6 g 3   Blood Pressure KIT Please check BP twice a day 1 each 0   clopidogrel (PLAVIX) 75 MG tablet TAKE 1 TABLET ONCE DAILY. 30 tablet 0   docusate sodium (COLACE) 100 MG capsule Take 1 capsule (100 mg total) by mouth daily as needed for mild constipation.     furosemide (LASIX) 40 MG tablet TAKE 1/2 TABLET EVERY DAY. 30 tablet 0   metoprolol succinate (TOPROL-XL) 50 MG 24 hr tablet TAKE 1 TABLET DAILY WITH OR IMMEDIATELY FOLLOWING A MEAL. 90 tablet 0   Misc. Devices (WALKER) MISC Use walker whenever walking, standard walker 1 each 0   omeprazole (PRILOSEC) 20 MG capsule Take 1 capsule (20 mg total) by mouth 2 (two) times daily before a meal. 60 capsule 3   pravastatin (PRAVACHOL) 40 MG tablet TAKE 1 TABLET ONCE DAILY. (Patient taking differently: Take 40 mg by mouth daily. ) 90 tablet 0   traZODone (DESYREL) 50 MG tablet Take 1-2 tablets (50-100 mg total) by mouth at bedtime. 180 tablet 1   triamcinolone (KENALOG) 0.025 % ointment Apply 1 application topically 2 (two) times daily.     No facility-administered medications prior to visit.     Review of Systems  Constitutional: Negative for chills and fever.  HENT: Negative for congestion.        Throat clearing after eating  Eyes: Negative.   Respiratory: Positive for cough, shortness of breath and wheezing.   Cardiovascular:       Leg swelling improved since hospitalization  Gastrointestinal: Negative for heartburn and vomiting.  Musculoskeletal:       No recent falls, ambulates with walker  Skin:       Improving eczema  Neurological: Positive for  weakness.     Objective:  Vitals:   10/05/19 1616  BP: 120/66  Pulse: 73  Temp: (!) 97.1 F (36.2 C)  TempSrc: Temporal  SpO2: 95%  Weight: 140 lb 8 oz (63.7 kg)  Height: '5\' 6"'  (1.676 m)   95% on   RA BMI Readings from Last 3 Encounters:  10/05/19 22.68 kg/m  09/10/19 19.51 kg/m  08/24/19 21.52 kg/m   Wt Readings from Last 3 Encounters:  10/05/19 140 lb 8 oz (63.7 kg)  09/10/19 136 lb (61.7 kg)  08/24/19 150 lb (68 kg)    Physical Exam Vitals signs reviewed.  Constitutional:      Comments: Frail appearing elderly gentleman sitting in a wheelchair  HENT:     Head: Normocephalic and atraumatic.     Nose:     Comments: Deferred due to masking requirement.    Mouth/Throat:     Comments: Deferred due to masking requirement. Eyes:     General: No scleral icterus.    Comments: Bilateral ectropion  Neck:     Musculoskeletal: Neck supple.  Cardiovascular:     Rate and Rhythm: Normal rate. Rhythm irregular.     Heart sounds: No murmur.  Pulmonary:     Comments: Breathing comfortably on room air, no conversational dyspnea.  CTAB. Abdominal:     General: There is no distension.     Palpations: Abdomen is soft.  Musculoskeletal:        General: No deformity.     Comments: RLE edema- mild.  Skin:    General: Skin is warm and dry.     Comments: Excoriations on bilateral ankles, mild erythema, not warm  Neurological:     Mental Status: He is alert.     Comments: Global weakness, riding in [place].  Stable appearance since previous exam.  Psychiatric:        Mood and Affect: Mood normal.        Behavior: Behavior normal.      CBC    Component Value Date/Time   WBC 13.1 (A) 07/30/2019 1619   WBC 11.4 (H) 07/20/2019 0540   RBC 3.92 (A) 07/30/2019 1619   RBC 4.08 (L) 07/20/2019 0540   HGB 12.4 07/30/2019 1619   HGB 13.0 07/20/2019 0540   HGB 14.3 03/11/2018 1545   HCT 37.7 07/30/2019 1619   HCT 39.3 07/20/2019 0540   HCT 43.6 03/11/2018 1545   PLT 266  07/20/2019 0540   PLT 198 03/11/2018 1545   MCV 96.3 07/30/2019 1619   MCV 96 03/11/2018 1545   MCH 31.6 (A) 07/30/2019 1619   MCH 31.9 07/20/2019 0540   MCHC 32.8 07/30/2019 1619   MCHC 33.1 07/20/2019 0540   RDW 12.0 07/20/2019 0540   RDW 12.7 03/11/2018 1545   LYMPHSABS 1.4 07/20/2019 0540   LYMPHSABS 1.5 03/11/2018 1545   MONOABS 0.8 07/20/2019 0540   EOSABS 1.0 (H) 07/20/2019 0540   EOSABS 1.1 (H) 03/11/2018 1545   BASOSABS 0.1 07/20/2019 0540   BASOSABS 0.0 03/11/2018 1545    CHEMISTRY CMP Latest Ref Rng & Units 07/30/2019 07/21/2019 07/20/2019  Glucose 65 - 99 mg/dL 121(H) 93 104(H)  BUN 8 - 27 mg/dL '12 16 15  ' Creatinine 0.76 - 1.27 mg/dL 1.05 1.42(H) 1.19  Sodium 134 - 144 mmol/L 139 137 137  Potassium 3.5 - 5.2 mmol/L 4.2 4.7 3.7  Chloride 96 - 106 mmol/L 98 102 100  CO2 20 - 29 mmol/L '21 25 28  ' Calcium 8.6 - 10.2 mg/dL 8.8 8.0(L) 8.1(L)  Total Protein 6.5 -  8.1 g/dL - - -  Total Bilirubin 0.3 - 1.2 mg/dL - - -  Alkaline Phos 38 - 126 U/L - - -  AST 15 - 41 U/L - - -  ALT 0 - 44 U/L - - -     Chest Imaging- films reviewed: CXR, 2 view 07/30/2019- elevated right hemidiaphragm, persistent left pleural effusion  CTA chest 07/14/2019- tracheal deviation to the right right and large cystic appearing thyroid mass.  Right greater than left dependant pleural effusions with significant right-sided volume loss and compressive atelectasis.  Diffuse groundglass opacities bilaterally, minimal fluid in bilateral major fissures.  Large pulmonary veins, generous left atrium.  No significant mediastinal or hilar adenopathy.  Patulous esophagus, which is also displaced by thyroid mass.  Kyphosis.  No PE.  Pulmonary Functions Testing Results: No flowsheet data found.   Echocardiogram 07/20/2019: LVEF 55 to 60%, no LVH.  Normal LA, RV, RA.  Mild TR and MR.  Moderate aortic sclerosis without stenosis.  No valve vegetations. Echocardiogram 07/17/2019-essentially the same, diastolic  dysfunction present.     Assessment & Plan:     ICD-10-CM   1. Pharyngeal dysphagia  R13.13   2. Mild persistent asthma without complication  P54.65   3. At high risk for aspiration  Z91.89     Pharyngeal dysphagia.  High aspiration risk. -Agree with SLP's recommendations, including recommendation for home health speech referral. -Reinforced the importance of continuing the recommended precautions- soft diet, liquids via cup, small bites with redundant swallowing, frequent throat clearing during meals.  Needs to be fully upright for meals and for some time afterwards. -Oral care multiple times per day, including after Qvar   Thyroid mass- simple cyst on path -Discussed pathology today -ENT follow-up as prescribed  Asthma- likely cardiogenic and related to aspiration.  Seems well controlled. -No changes to asthma regimen at this time- continue Qvar twice daily and albuterol as needed.  If he remains stable, the Qvar may be able to be taken off of his regimen. -No change to GERD regimen at this time-continue omeprazole daily -Up-to-date on flu and pneumococcal vaccinations.  Tiny, dependent bilateral pleural effusions associated with heart failure.  I demonstrated on recent abdominal CT -Continue current diuretic regimen -No indication for additional procedures or evaluation.  RTC PRN.     Current Outpatient Medications:    acetaminophen (TYLENOL) 500 MG tablet, Take 500-1,000 mg by mouth as needed for mild pain or headache (pain from eczema). , Disp: , Rfl:    albuterol (PROAIR HFA) 108 (90 Base) MCG/ACT inhaler, Inhale 1-2 puffs into the lungs every 6 (six) hours as needed for wheezing or shortness of breath., Disp: 8 g, Rfl: 3   alendronate (FOSAMAX) 70 MG tablet, TAKE 1 TABLET EVERY 7 DAYS. TAKE WITH A FULL GLASS OF WATER ON AN EMPTY STOMACH., Disp: 4 tablet, Rfl: 0   beclomethasone (QVAR REDIHALER) 40 MCG/ACT inhaler, Inhale 1 puff into the lungs 2 (two) times daily.,  Disp: 10.6 g, Rfl: 3   Blood Pressure KIT, Please check BP twice a day, Disp: 1 each, Rfl: 0   clopidogrel (PLAVIX) 75 MG tablet, TAKE 1 TABLET ONCE DAILY., Disp: 30 tablet, Rfl: 0   docusate sodium (COLACE) 100 MG capsule, Take 1 capsule (100 mg total) by mouth daily as needed for mild constipation., Disp: , Rfl:    furosemide (LASIX) 40 MG tablet, TAKE 1/2 TABLET EVERY DAY., Disp: 30 tablet, Rfl: 0   metoprolol succinate (TOPROL-XL) 50 MG 24 hr tablet,  TAKE 1 TABLET DAILY WITH OR IMMEDIATELY FOLLOWING A MEAL., Disp: 90 tablet, Rfl: 0   Misc. Devices (WALKER) MISC, Use walker whenever walking, standard walker, Disp: 1 each, Rfl: 0   omeprazole (PRILOSEC) 20 MG capsule, Take 1 capsule (20 mg total) by mouth 2 (two) times daily before a meal., Disp: 60 capsule, Rfl: 3   pravastatin (PRAVACHOL) 40 MG tablet, TAKE 1 TABLET ONCE DAILY. (Patient taking differently: Take 40 mg by mouth daily. ), Disp: 90 tablet, Rfl: 0   traZODone (DESYREL) 50 MG tablet, Take 1-2 tablets (50-100 mg total) by mouth at bedtime., Disp: 180 tablet, Rfl: 1   triamcinolone (KENALOG) 0.025 % ointment, Apply 1 application topically 2 (two) times daily., Disp: , Rfl:    Julian Hy, DO [facility] 10/05/2019 5:02 PM

## 2019-10-08 ENCOUNTER — Other Ambulatory Visit: Payer: Self-pay

## 2019-10-08 ENCOUNTER — Ambulatory Visit (INDEPENDENT_AMBULATORY_CARE_PROVIDER_SITE_OTHER): Payer: Medicare Other | Admitting: Podiatry

## 2019-10-08 ENCOUNTER — Encounter: Payer: Self-pay | Admitting: Podiatry

## 2019-10-08 ENCOUNTER — Other Ambulatory Visit: Payer: Self-pay | Admitting: Podiatry

## 2019-10-08 ENCOUNTER — Ambulatory Visit (INDEPENDENT_AMBULATORY_CARE_PROVIDER_SITE_OTHER): Payer: Medicare Other

## 2019-10-08 DIAGNOSIS — M79675 Pain in left toe(s): Secondary | ICD-10-CM | POA: Diagnosis not present

## 2019-10-08 DIAGNOSIS — L97522 Non-pressure chronic ulcer of other part of left foot with fat layer exposed: Secondary | ICD-10-CM

## 2019-10-08 DIAGNOSIS — D689 Coagulation defect, unspecified: Secondary | ICD-10-CM | POA: Diagnosis not present

## 2019-10-08 NOTE — Progress Notes (Signed)
Subjective:  Patient ID: [name], male    DOB: 03/07/1932,  MRN: [id number]  Chief Complaint  Patient presents with  . Foot Pain    pt is here for a f/u of the left big toe ulcer, pt is doing alot better since the last time he was here, pt has been keeping his foot nice and dry, pt also shows no signs of infection as well    85 y.o. male presents for wound care.  Patient presents with a follow-up of the left hallux ulceration distal tip.  Patient states that the wound is looking better.  Patient's caretaker has been doing daily dressing changes with triple antibiotic and a Band-Aid.  There is no clinical signs of infection.  They have been keeping it dry and red.  Patient states the surgical shoe has also been helping.  Patient denies any other complaints.   Review of Systems: Negative except as noted in the HPI. Denies N/V/F/Ch.  Past Medical History:  Diagnosis Date  . Allergy    seasonal  fall  . Asthma    as child  . Cancer ([place])    Basal cell carcinoma; multiple  . Dyspnea   . Eczema   . Hyperlipidemia   . Hypertension   . MSSA bacteremia 2020  . Osteoporosis   . Pleural effusion due to CHF (congestive heart failure) ([place])   . Stroke [facility])     Current Outpatient Medications:  .  acetaminophen (TYLENOL) 500 MG tablet, Take 500-1,000 mg by mouth as needed for mild pain or headache (pain from eczema). , Disp: , Rfl:  .  albuterol (PROAIR HFA) 108 (90 Base) MCG/ACT inhaler, Inhale 1-2 puffs into the lungs every 6 (six) hours as needed for wheezing or shortness of breath., Disp: 8 g, Rfl: 3 .  alendronate (FOSAMAX) 70 MG tablet, TAKE 1 TABLET EVERY 7 DAYS. TAKE WITH A FULL GLASS OF WATER ON AN EMPTY STOMACH., Disp: 4 tablet, Rfl: 0 .  beclomethasone (QVAR REDIHALER) 40 MCG/ACT inhaler, Inhale 1 puff into the lungs 2 (two) times daily., Disp: 10.6 g, Rfl: 3 .  Blood Pressure KIT, Please check BP twice a day, Disp: 1 each, Rfl: 0 .  clopidogrel (PLAVIX) 75 MG tablet, TAKE 1  TABLET ONCE DAILY., Disp: 30 tablet, Rfl: 0 .  docusate sodium (COLACE) 100 MG capsule, Take 1 capsule (100 mg total) by mouth daily as needed for mild constipation., Disp: , Rfl:  .  furosemide (LASIX) 40 MG tablet, TAKE 1/2 TABLET EVERY DAY., Disp: 30 tablet, Rfl: 0 .  metoprolol succinate (TOPROL-XL) 50 MG 24 hr tablet, TAKE 1 TABLET DAILY WITH OR IMMEDIATELY FOLLOWING A MEAL., Disp: 90 tablet, Rfl: 0 .  Misc. Devices (WALKER) MISC, Use walker whenever walking, standard walker, Disp: 1 each, Rfl: 0 .  omeprazole (PRILOSEC) 20 MG capsule, Take 1 capsule (20 mg total) by mouth 2 (two) times daily before a meal., Disp: 60 capsule, Rfl: 3 .  pravastatin (PRAVACHOL) 40 MG tablet, TAKE 1 TABLET ONCE DAILY. (Patient taking differently: Take 40 mg by mouth daily. ), Disp: 90 tablet, Rfl: 0 .  traZODone (DESYREL) 50 MG tablet, Take 1-2 tablets (50-100 mg total) by mouth at bedtime., Disp: 180 tablet, Rfl: 1 .  triamcinolone (KENALOG) 0.025 % ointment, Apply 1 application topically 2 (two) times daily., Disp: , Rfl:   Social History   Tobacco Use  Smoking Status Never Smoker  Smokeless Tobacco Never Used    Allergies  Allergen Reactions  .  Other Shortness Of Breath, Itching and Other (See Comments)    Seasonal allergies- Runny nose, congestion, itchy eyes, and wheezing   Objective:  There were no vitals filed for this visit. There is no height or weight on file to calculate BMI. Constitutional Well developed. Well nourished.  Vascular Dorsalis pedis pulses palpable bilaterally. Posterior tibial pulses palpable bilaterally. Capillary refill normal to all digits.  No cyanosis or clubbing noted. Pedal hair growth normal.  Neurologic Normal speech. Oriented to person, place, and time. Protective sensation absent  Dermatologic Wound Location: Left distal hallux tip ulceration does not probe to bone. Wound Base: Granular/Healthy Peri-wound: Calloused Exudate: Scant/small amount serous  sanguinous Wound Measurements: -See below Nail Exam: Pt has thick disfigured discolored nails with subungual debris noted bilateral entire nail hallux through fifth toenails  Orthopedic: No pain to palpation either foot.   Radiographs: 2 views of skeletally mature adult: X-rays were compared to the previous study: It appears that there may be some cortical changes at the distal tip of the hallux however when evaluating with the previous study it has not changed.  If it is osteomyelitis it is likely chronic in nature and not acute.  Assessment:   1. Toe pain, left   2. Coagulation defect ([place])   3. Skin ulcer of left great toe with fat layer exposed ([place])    Plan:  Patient was evaluated and treated and all questions answered.  Ulcer left hallux distal tip ulceration -Debridement as below. -Dressed with triple antibiotic, DSD. -Continue off-loading with surgical shoe. -Prisma was applied to the ulceration and I instructed the patient to do so every 2 days.  Procedure: Excisional Debridement of Wound Rationale: Removal of non-viable soft tissue from the wound to promote healing.  Anesthesia: none Pre-Debridement Wound Measurements: 0.5 cm x 0.5 cm x 0.1 cm  Post-Debridement Wound Measurements: 0.4 cm x 0.4 cm x 0.1 cm  Type of Debridement: Sharp Excisional Tissue Removed: Non-viable soft tissue Depth of Debridement: subcutaneous tissue. Technique: Sharp excisional debridement to bleeding, viable wound base.  Dressing: Dry, sterile, compression dressing. Disposition: Patient tolerated procedure well. Patient to return in 1 week for follow-up.  No follow-ups on file.   Boneta Lucks, DPM    No follow-ups on file.

## 2019-10-12 NOTE — Telephone Encounter (Signed)
Spoke with daughter [name] regarding her message regarding her father and she stated that they have already had the test done and gotten the answers that thry were looking for. Pk/CMA

## 2019-10-19 ENCOUNTER — Telehealth: Payer: Self-pay | Admitting: Family Medicine

## 2019-10-19 NOTE — Telephone Encounter (Signed)
[name] is calling from [facility] she is  needing verbal to extend physical therapy 1X a week for 3 weeks  [phone]

## 2019-10-20 NOTE — Telephone Encounter (Signed)
Verbal given 

## 2019-11-01 ENCOUNTER — Ambulatory Visit (INDEPENDENT_AMBULATORY_CARE_PROVIDER_SITE_OTHER): Payer: Medicare HMO | Admitting: Podiatry

## 2019-11-01 ENCOUNTER — Other Ambulatory Visit: Payer: Self-pay

## 2019-11-01 DIAGNOSIS — M79675 Pain in left toe(s): Secondary | ICD-10-CM

## 2019-11-01 DIAGNOSIS — L97522 Non-pressure chronic ulcer of other part of left foot with fat layer exposed: Secondary | ICD-10-CM

## 2019-11-01 DIAGNOSIS — D689 Coagulation defect, unspecified: Secondary | ICD-10-CM

## 2019-11-02 ENCOUNTER — Encounter: Payer: Self-pay | Admitting: Podiatry

## 2019-11-02 NOTE — Progress Notes (Signed)
Subjective:  Patient ID: [name], male    DOB: 24-Apr-1932,  MRN: [id number]  Chief Complaint  Patient presents with  . Foot Ulcer    pt is here for an ulcer f/u, pt states that the right foot is possibly infected, pt also states that the right foot has an odor to it, as well as has some redness around it.    86 y.o. male presents for wound care.  Patient presents with a follow-up of the left hallux ulceration distal tip.  Patient states that the wound is looking better.  Patient's caretaker has been doing daily dressing changes with triple antibiotic and a Band-Aid.  There is no clinical signs of infection.  They have been keeping it dry and red.  Patient states the surgical shoe has also been helping.  Patient denies any other complaints.   Review of Systems: Negative except as noted in the HPI. Denies N/V/F/Ch.  Past Medical History:  Diagnosis Date  . Allergy    seasonal  fall  . Asthma    as child  . Cancer ([place])    Basal cell carcinoma; multiple  . Dyspnea   . Eczema   . Hyperlipidemia   . Hypertension   . MSSA bacteremia 2020  . Osteoporosis   . Pleural effusion due to CHF (congestive heart failure) ([place])   . Stroke [name])     Current Outpatient Medications:  .  acetaminophen (TYLENOL) 500 MG tablet, Take 500-1,000 mg by mouth as needed for mild pain or headache (pain from eczema). , Disp: , Rfl:  .  albuterol (PROAIR HFA) 108 (90 Base) MCG/ACT inhaler, Inhale 1-2 puffs into the lungs every 6 (six) hours as needed for wheezing or shortness of breath., Disp: 8 g, Rfl: 3 .  alendronate (FOSAMAX) 70 MG tablet, TAKE 1 TABLET EVERY 7 DAYS. TAKE WITH A FULL GLASS OF WATER ON AN EMPTY STOMACH., Disp: 4 tablet, Rfl: 0 .  beclomethasone (QVAR REDIHALER) 40 MCG/ACT inhaler, Inhale 1 puff into the lungs 2 (two) times daily., Disp: 10.6 g, Rfl: 3 .  Blood Pressure KIT, Please check BP twice a day, Disp: 1 each, Rfl: 0 .  clopidogrel (PLAVIX) 75 MG tablet, TAKE 1 TABLET ONCE DAILY.,  Disp: 30 tablet, Rfl: 0 .  docusate sodium (COLACE) 100 MG capsule, Take 1 capsule (100 mg total) by mouth daily as needed for mild constipation., Disp: , Rfl:  .  furosemide (LASIX) 40 MG tablet, TAKE 1/2 TABLET EVERY DAY., Disp: 30 tablet, Rfl: 0 .  metoprolol succinate (TOPROL-XL) 50 MG 24 hr tablet, TAKE 1 TABLET DAILY WITH OR IMMEDIATELY FOLLOWING A MEAL., Disp: 90 tablet, Rfl: 0 .  Misc. Devices (WALKER) MISC, Use walker whenever walking, standard walker, Disp: 1 each, Rfl: 0 .  omeprazole (PRILOSEC) 20 MG capsule, Take 1 capsule (20 mg total) by mouth 2 (two) times daily before a meal., Disp: 60 capsule, Rfl: 3 .  pravastatin (PRAVACHOL) 40 MG tablet, TAKE 1 TABLET ONCE DAILY. (Patient taking differently: Take 40 mg by mouth daily. ), Disp: 90 tablet, Rfl: 0 .  traZODone (DESYREL) 50 MG tablet, Take 1-2 tablets (50-100 mg total) by mouth at bedtime., Disp: 180 tablet, Rfl: 1 .  triamcinolone (KENALOG) 0.025 % ointment, Apply 1 application topically 2 (two) times daily., Disp: , Rfl:   Social History   Tobacco Use  Smoking Status Never Smoker  Smokeless Tobacco Never Used    Allergies  Allergen Reactions  . Other Shortness Of Breath, Itching  and Other (See Comments)    Seasonal allergies- Runny nose, congestion, itchy eyes, and wheezing   Objective:  There were no vitals filed for this visit. There is no height or weight on file to calculate BMI. Constitutional Well developed. Well nourished.  Vascular Dorsalis pedis pulses palpable bilaterally. Posterior tibial pulses palpable bilaterally. Capillary refill normal to all digits.  No cyanosis or clubbing noted. Pedal hair growth normal.  Neurologic Normal speech. Oriented to person, place, and time. Protective sensation absent  Dermatologic Wound Location: Left distal hallux tip ulceration does not probe to bone. Wound Base: Granular/Healthy Peri-wound: Calloused Exudate: Scant/small amount serous sanguinous Wound  Measurements: -See below Nail Exam: Pt has thick disfigured discolored nails with subungual debris noted bilateral entire nail hallux through fifth toenails  Orthopedic: No pain to palpation either foot.   Radiographs: None  Assessment:   1. Toe pain, left   2. Coagulation defect ([name])   3. Skin ulcer of left great toe with fat layer exposed ([name])    Plan:  Patient was evaluated and treated and all questions answered.  Ulcer left hallux distal tip ulceration-stagnant -Debridement as below. -Dressed with triple antibiotic, DSD. -Continue off-loading with surgical shoe. -Prisma was applied to the ulceration and I instructed the patient to do so every 2 days. -Offloading pad was dispensed to help offload the distal ulceration of the hallux.  -I will consider getting an x-ray during next visit to monitor any progression of chronic osteomyelitis.  Procedure: Excisional Debridement of Wound Rationale: Removal of non-viable soft tissue from the wound to promote healing.  Anesthesia: none Pre-Debridement Wound Measurements: 0.4 cm x 0.4 cm x 0.1 cm   Post-Debridement Wound Measurements: 0.4 cm x 0.4 cm x 0.1 cm  Type of Debridement: Sharp Excisional Tissue Removed: Non-viable soft tissue Depth of Debridement: subcutaneous tissue. Technique: Sharp excisional debridement to bleeding, viable wound base.  Dressing: Dry, sterile, compression dressing. Disposition: Patient tolerated procedure well. Patient to return in 1 week for follow-up.  No follow-ups on file.   Boneta Lucks, DPM    No follow-ups on file.

## 2019-11-04 DIAGNOSIS — R4182 Altered mental status, unspecified: Secondary | ICD-10-CM | POA: Diagnosis not present

## 2019-11-04 DIAGNOSIS — J449 Chronic obstructive pulmonary disease, unspecified: Secondary | ICD-10-CM | POA: Diagnosis not present

## 2019-11-07 DIAGNOSIS — R4182 Altered mental status, unspecified: Secondary | ICD-10-CM | POA: Diagnosis not present

## 2019-11-08 ENCOUNTER — Telehealth: Payer: Self-pay | Admitting: Adult Health

## 2019-11-08 NOTE — Telephone Encounter (Signed)
I called patient and LVM regarding rescheduling 1/14 appointment, or converting to MyChart, due to NP working from home. Requested patient call back to discuss.

## 2019-11-11 ENCOUNTER — Ambulatory Visit: Payer: Self-pay | Admitting: Adult Health

## 2019-11-12 ENCOUNTER — Ambulatory Visit (INDEPENDENT_AMBULATORY_CARE_PROVIDER_SITE_OTHER): Payer: Medicare HMO | Admitting: Podiatry

## 2019-11-12 ENCOUNTER — Other Ambulatory Visit: Payer: Self-pay

## 2019-11-12 ENCOUNTER — Encounter: Payer: Self-pay | Admitting: Podiatry

## 2019-11-12 DIAGNOSIS — D689 Coagulation defect, unspecified: Secondary | ICD-10-CM | POA: Diagnosis not present

## 2019-11-12 DIAGNOSIS — B351 Tinea unguium: Secondary | ICD-10-CM | POA: Diagnosis not present

## 2019-11-12 DIAGNOSIS — M79676 Pain in unspecified toe(s): Secondary | ICD-10-CM

## 2019-11-12 DIAGNOSIS — L97522 Non-pressure chronic ulcer of other part of left foot with fat layer exposed: Secondary | ICD-10-CM | POA: Diagnosis not present

## 2019-11-12 NOTE — Progress Notes (Signed)
Subjective:  Patient ID: [name], male    DOB: 20-Dec-1931,  MRN: [id number]  Chief Complaint  Patient presents with  . Follow-up  . Foot Ulcer    left hallux , pt states that the toe has gotten better. declines any pain     86 y.o. male presents for wound care.  Patient is a 1 week follow-up of an ulcer to the left hallux patient states that there is a considerable improvement.  Patient is here with his caretaker who has been taking care of the wound.  She states that overall the wound is completely healed.  She would like to know if there is more local wound care that she needs to do or we can hold off for now.  There is no clinical signs of infection.  She is patient also has a secondary complaint of the left toenails 1 through 5 being thickened elongated mycotic and would like to know if they can be debrided down as patient not able to do it himself.  Because some pain with ambulation.  Otherwise no acute complaints.  Hospitalist notes that she came   Review of Systems: Negative except as noted in the HPI. Denies N/V/F/Ch.  Past Medical History:  Diagnosis Date  . Allergy    seasonal  fall  . Asthma    as child  . Cancer ([place])    Basal cell carcinoma; multiple  . Dyspnea   . Eczema   . Hyperlipidemia   . Hypertension   . MSSA bacteremia 2020  . Osteoporosis   . Pleural effusion due to CHF (congestive heart failure) ([place])   . Stroke [facility])     Current Outpatient Medications:  .  acetaminophen (TYLENOL) 500 MG tablet, Take 500-1,000 mg by mouth as needed for mild pain or headache (pain from eczema). , Disp: , Rfl:  .  albuterol (PROAIR HFA) 108 (90 Base) MCG/ACT inhaler, Inhale 1-2 puffs into the lungs every 6 (six) hours as needed for wheezing or shortness of breath., Disp: 8 g, Rfl: 3 .  alendronate (FOSAMAX) 70 MG tablet, TAKE 1 TABLET EVERY 7 DAYS. TAKE WITH A FULL GLASS OF WATER ON AN EMPTY STOMACH., Disp: 4 tablet, Rfl: 0 .  beclomethasone (QVAR REDIHALER) 40 MCG/ACT  inhaler, Inhale 1 puff into the lungs 2 (two) times daily., Disp: 10.6 g, Rfl: 3 .  Blood Pressure KIT, Please check BP twice a day, Disp: 1 each, Rfl: 0 .  clopidogrel (PLAVIX) 75 MG tablet, TAKE 1 TABLET ONCE DAILY., Disp: 30 tablet, Rfl: 0 .  docusate sodium (COLACE) 100 MG capsule, Take 1 capsule (100 mg total) by mouth daily as needed for mild constipation., Disp: , Rfl:  .  furosemide (LASIX) 40 MG tablet, TAKE 1/2 TABLET EVERY DAY., Disp: 30 tablet, Rfl: 0 .  metoprolol succinate (TOPROL-XL) 50 MG 24 hr tablet, TAKE 1 TABLET DAILY WITH OR IMMEDIATELY FOLLOWING A MEAL., Disp: 90 tablet, Rfl: 0 .  Misc. Devices (WALKER) MISC, Use walker whenever walking, standard walker, Disp: 1 each, Rfl: 0 .  omeprazole (PRILOSEC) 20 MG capsule, Take 1 capsule (20 mg total) by mouth 2 (two) times daily before a meal., Disp: 60 capsule, Rfl: 3 .  pravastatin (PRAVACHOL) 40 MG tablet, TAKE 1 TABLET ONCE DAILY. (Patient taking differently: Take 40 mg by mouth daily. ), Disp: 90 tablet, Rfl: 0 .  traZODone (DESYREL) 50 MG tablet, Take 1-2 tablets (50-100 mg total) by mouth at bedtime., Disp: 180 tablet, Rfl: 1 .  triamcinolone (KENALOG) 0.025 % ointment, Apply 1 application topically 2 (two) times daily., Disp: , Rfl:   Social History   Tobacco Use  Smoking Status Never Smoker  Smokeless Tobacco Never Used    Allergies  Allergen Reactions  . Other Shortness Of Breath, Itching and Other (See Comments)    Seasonal allergies- Runny nose, congestion, itchy eyes, and wheezing   Objective:  There were no vitals filed for this visit. There is no height or weight on file to calculate BMI. Constitutional Well developed. Well nourished.  Vascular Dorsalis pedis pulses palpable bilaterally. Posterior tibial pulses palpable bilaterally. Capillary refill normal to all digits.  No cyanosis or clubbing noted. Pedal hair growth normal.  Neurologic Normal speech. Oriented to person, place, and time. Protective  sensation absent  Dermatologic  left hallux distal tip ulceration with reepithelialization of the skin ulcer noted.  No clinical signs of infection noted. Nail Exam: Pt has thick disfigured discolored nails with subungual debris noted bilateral entire nail hallux through fifth toenails  Orthopedic: No pain to palpation either foot.   Radiographs: None  Assessment:   1. Coagulation defect ([place])   2. Skin ulcer of left great toe with fat layer exposed ([place])   3. Pain due to onychomycosis of toenail    Plan:  Patient was evaluated and treated and all questions answered.  Ulcer left hallux distal tip ulceration-stagnant -Completely resolved.  There is good reepithelialization of the skin noted.  No clinical signs of infection noted.  I explained to the patient that if ulceration reappears or if there is any other foot and ankle issues and come and see me right away.  Patient states understanding  Onychomycosis with pain left 1 through 5 -Nails palliatively debrided as below. -Educated on self-care  Procedure: Nail Debridement Rationale: pain  Type of Debridement: manual, sharp debridement. Instrumentation: Nail nipper, rotary burr. Number of Nails: 10  Procedures and Treatment: Consent by patient was obtained for treatment procedures. The patient understood the discussion of treatment and procedures well. All questions were answered thoroughly reviewed. Debridement of mycotic and hypertrophic toenails, 1 through 5 bilateral and clearing of subungual debris. No ulceration, no infection noted.  Return Visit-Office Procedure: Patient instructed to return to the office for a follow up visit 3 months for continued evaluation and treatment.  Boneta Lucks, DPM    No follow-ups on file.   No follow-ups on file.   Boneta Lucks, DPM    No follow-ups on file.

## 2019-11-19 ENCOUNTER — Other Ambulatory Visit: Payer: Self-pay

## 2019-11-19 ENCOUNTER — Encounter: Payer: Self-pay | Admitting: Family Medicine

## 2019-11-19 ENCOUNTER — Ambulatory Visit (INDEPENDENT_AMBULATORY_CARE_PROVIDER_SITE_OTHER): Payer: Medicare HMO | Admitting: Family Medicine

## 2019-11-19 VITALS — BP 129/65 | HR 67 | Temp 98.0°F | Ht 66.0 in | Wt 137.0 lb

## 2019-11-19 DIAGNOSIS — R2681 Unsteadiness on feet: Secondary | ICD-10-CM

## 2019-11-19 DIAGNOSIS — R41841 Cognitive communication deficit: Secondary | ICD-10-CM | POA: Diagnosis not present

## 2019-11-19 DIAGNOSIS — R29818 Other symptoms and signs involving the nervous system: Secondary | ICD-10-CM

## 2019-11-19 DIAGNOSIS — I10 Essential (primary) hypertension: Secondary | ICD-10-CM

## 2019-11-19 DIAGNOSIS — R4189 Other symptoms and signs involving cognitive functions and awareness: Secondary | ICD-10-CM

## 2019-11-19 DIAGNOSIS — N3941 Urge incontinence: Secondary | ICD-10-CM

## 2019-11-19 DIAGNOSIS — E785 Hyperlipidemia, unspecified: Secondary | ICD-10-CM | POA: Diagnosis not present

## 2019-11-19 DIAGNOSIS — R000 Tachycardia, unspecified: Secondary | ICD-10-CM

## 2019-11-19 MED ORDER — QVAR REDIHALER 40 MCG/ACT IN AERB: 1.0000 | INHALATION_SPRAY | Freq: Two times a day (BID) | RESPIRATORY_TRACT | 3 refills | Status: DC

## 2019-11-19 MED ORDER — TRIAMCINOLONE ACETONIDE 0.025 % EX OINT: 1.0000 "application " | TOPICAL_OINTMENT | Freq: Two times a day (BID) | CUTANEOUS | 5 refills | Status: AC

## 2019-11-19 MED ORDER — FUROSEMIDE 20 MG PO TABS: 20.0000 mg | ORAL_TABLET | Freq: Every day | ORAL | 2 refills | Status: DC

## 2019-11-19 MED ORDER — METOPROLOL SUCCINATE ER 50 MG PO TB24: ORAL_TABLET | ORAL | 0 refills | Status: DC

## 2019-11-19 MED ORDER — TRAZODONE HCL 50 MG PO TABS: 50.0000 mg | ORAL_TABLET | Freq: Every day | ORAL | 1 refills | Status: AC

## 2019-11-19 MED ORDER — ALENDRONATE SODIUM 70 MG PO TABS: ORAL_TABLET | ORAL | 0 refills | Status: DC

## 2019-11-19 MED ORDER — CLOPIDOGREL BISULFATE 75 MG PO TABS: 75.0000 mg | ORAL_TABLET | Freq: Every day | ORAL | 0 refills | Status: DC

## 2019-11-19 MED ORDER — PRAVASTATIN SODIUM 40 MG PO TABS: 40.0000 mg | ORAL_TABLET | Freq: Every day | ORAL | 0 refills | Status: DC

## 2019-11-19 MED ORDER — ALBUTEROL SULFATE HFA 108 (90 BASE) MCG/ACT IN AERS: 1.0000 | INHALATION_SPRAY | Freq: Four times a day (QID) | RESPIRATORY_TRACT | 3 refills | Status: AC | PRN

## 2019-11-19 NOTE — Patient Instructions (Signed)
° ° ° °  If you have lab work done today you will be contacted with your lab results within the next 2 weeks.  If you have not heard from us then please contact us. The fastest way to get your results is to register for My Chart. ° ° °IF you received an x-ray today, you will receive an invoice from [facility]. Please contact [facility] at [phone] with questions or concerns regarding your invoice.  ° °IF you received labwork today, you will receive an invoice from [facility]. Please contact [facility] at [phone] with questions or concerns regarding your invoice.  ° °Our billing staff will not be able to assist you with questions regarding bills from these companies. ° °You will be contacted with the lab results as soon as they are available. The fastest way to get your results is to activate your My Chart account. Instructions are located on the last page of this paperwork. If you have not heard from us regarding the results in 2 weeks, please contact this office. °  ° ° ° °

## 2019-11-19 NOTE — Progress Notes (Signed)
1/22/20213:05 PM  [name] 28-May-1932, 86 y.o., male [id number]  Chief Complaint  Patient presents with  . Medication Refill    daughter is asking for Qvar inhaler be on hard copy due to cost of current pharmacy. family is concerned about mobility and short term memory. long term memory is very well. Family is also asking is there a 47m of lasix    HPI:   Patient is a 86y.o. male with past medical history significant for HTN, asthma, BPH, CKD, HLP, CVA, osteoporosis who presents today for routine followup  Last OV Nov 2020 Had thyroid FNA cyst  Brought in by caregiver Recent changes past several months Weight has been stable, good appetite Noticing acute decline in short term memory - needing more reminders, long term memory is excellent Having decrease mobility/balance even with use of walker, tends to go backwards Home health has stopped coming by as he has been getting worse Polyuria, wears depends  Lab Results  Component Value Date   CHOL 153 03/08/2019   HDL 66 03/08/2019   LDLCALC 80 03/08/2019   LDLDIRECT 101 (H) 06/03/2014   TRIG 36 03/08/2019   CHOLHDL 2.3 03/08/2019    Depression screen PHQ 2/9 09/10/2019 08/20/2019 07/30/2019  Decreased Interest 0 0 0  Down, Depressed, Hopeless 0 0 0  PHQ - 2 Score 0 0 0  Altered sleeping - - -  Tired, decreased energy - - -  Change in appetite - - -  Feeling bad or failure about yourself  - - -  Trouble concentrating - - -  Moving slowly or fidgety/restless - - -  Suicidal thoughts - - -  PHQ-9 Score - - -  Difficult doing work/chores - - -    Fall Risk  11/19/2019 09/10/2019 08/20/2019 07/30/2019 05/07/2019  Falls in the past year? - 0 0 1 0  Comment - - - - -  Number falls in past yr: - 0 0 1 0  Injury with Fall? - 0 0 1 0  Comment - - - - -  Risk Factor Category  - - - - -  Risk for fall due to : Impaired balance/gait;Impaired mobility Impaired mobility;Impaired balance/gait Impaired mobility;Impaired  balance/gait;Orthopedic patient - -  Follow up - - - - -     Allergies  Allergen Reactions  . Other Shortness Of Breath, Itching and Other (See Comments)    Seasonal allergies- Runny nose, congestion, itchy eyes, and wheezing    Prior to Admission medications   Medication Sig Start Date End Date Taking? Authorizing Provider  acetaminophen (TYLENOL) 500 MG tablet Take 500-1,000 mg by mouth as needed for mild pain or headache (pain from eczema).    Yes [provider]  albuterol (PROAIR HFA) 108 (90 Base) MCG/ACT inhaler Inhale 1-2 puffs into the lungs every 6 (six) hours as needed for wheezing or shortness of breath. 11/19/19  Yes SRutherford Guys MD  alendronate (FOSAMAX) 70 MG tablet TAKE 1 TABLET EVERY 7 DAYS. TAKE WITH A FULL GLASS OF WATER ON AN EMPTY STOMACH. 11/19/19  Yes SRutherford Guys MD  beclomethasone (QVAR REDIHALER) 40 MCG/ACT inhaler Inhale 1 puff into the lungs 2 (two) times daily. 11/19/19  Yes SRutherford Guys MD  Blood Pressure KIT Please check BP twice a day 03/25/19  Yes SRutherford Guys MD  clopidogrel (PLAVIX) 75 MG tablet Take 1 tablet (75 mg total) by mouth daily. 11/19/19  Yes SRutherford Guys MD  docusate sodium (COLACE)  100 MG capsule Take 1 capsule (100 mg total) by mouth daily as needed for mild constipation. 07/21/19  Yes Hongalgi, Lenis Dickinson, MD  furosemide (LASIX) 20 MG tablet Take 1 tablet (20 mg total) by mouth daily. 11/19/19  Yes Rutherford Guys, MD  metoprolol succinate (TOPROL-XL) 50 MG 24 hr tablet TAKE 1 TABLET DAILY WITH OR IMMEDIATELY FOLLOWING A MEAL. 11/19/19  Yes Rutherford Guys, MD  Misc. Devices [facility]) MISC Use walker whenever walking, standard walker 07/27/18  Yes Rutherford Guys, MD  pravastatin (PRAVACHOL) 40 MG tablet Take 1 tablet (40 mg total) by mouth daily. 11/19/19  Yes Rutherford Guys, MD  traZODone (DESYREL) 50 MG tablet Take 1-2 tablets (50-100 mg total) by mouth at bedtime. 11/19/19  Yes Rutherford Guys, MD  triamcinolone  (KENALOG) 0.025 % ointment Apply 1 application topically 2 (two) times daily. 11/19/19  Yes Rutherford Guys, MD    Past Medical History:  Diagnosis Date  . Allergy    seasonal  fall  . Asthma    as child  . Cancer ([place])    Basal cell carcinoma; multiple  . Dyspnea   . Eczema   . Hyperlipidemia   . Hypertension   . MSSA bacteremia 2020  . Osteoporosis   . Pleural effusion due to CHF (congestive heart failure) ([place])   . Stroke [facility])     Past Surgical History:  Procedure Laterality Date  . CATARACT EXTRACTION, BILATERAL  06/29/2015  . INTRAMEDULLARY (IM) NAIL INTERTROCHANTERIC Left 03/30/2017   Procedure: INTRAMEDULLARY (IM) NAIL INTERTROCHANTRIC;  Surgeon: [name], MD;  Location: [place];  Service: Orthopedics;  Laterality: Left;  . INTRAMEDULLARY (IM) NAIL INTERTROCHANTERIC Right 11/12/2018   Procedure: INTRAMEDULLARY (IM) NAIL RIGHT INTERTROCHANTERIC HIP FRACTURE;  Surgeon: [name], MD;  Location: [place];  Service: Orthopedics;  Laterality: Right;  . MOHS SURGERY    . TEE WITHOUT CARDIOVERSION N/A 07/20/2019   Procedure: TRANSESOPHAGEAL ECHOCARDIOGRAM (TEE);  Surgeon: [name], MD;  Location: [facility] ENDOSCOPY;  Service: Cardiovascular;  Laterality: N/A;  . TONSILLECTOMY  1940 maybe    Social History   Tobacco Use  . Smoking status: Never Smoker  . Smokeless tobacco: Never Used  Substance Use Topics  . Alcohol use: No    Comment: occas    Family History  Problem Relation Age of Onset  . Arthritis Mother   . Heart disease Mother 40  . Hypertension Father     ROS Per hpi  OBJECTIVE:  Today's Vitals   11/19/19 1452  BP: 129/65  Pulse: 67  Temp: 98 F (36.7 C)  SpO2: 94%  Weight: 137 lb (62.1 kg)  Height: '5\' 6"'  (1.676 m)   Body mass index is 22.11 kg/m.  Wt Readings from Last 3 Encounters:  11/19/19 137 lb (62.1 kg)  10/05/19 140 lb 8 oz (63.7 kg)  09/10/19 136 lb (61.7 kg)    Physical Exam Vitals and nursing note reviewed.    Constitutional:      Appearance: He is well-developed.  HENT:     Head: Normocephalic and atraumatic.  Eyes:     Extraocular Movements: Extraocular movements intact.     Conjunctiva/sclera: Conjunctivae normal.     Pupils: Pupils are equal, round, and reactive to light.  Cardiovascular:     Rate and Rhythm: Normal rate and regular rhythm.     Heart sounds: No murmur. No friction rub. No gallop.   Pulmonary:     Effort: Pulmonary effort is  normal.     Breath sounds: Normal breath sounds. No wheezing, rhonchi or rales.  Musculoskeletal:     Cervical back: Neck supple.     Right lower leg: No edema.     Left lower leg: No edema.  Skin:    General: Skin is warm and dry.  Neurological:     Mental Status: He is alert and oriented to person, place, and time.     Cranial Nerves: Cranial nerves are intact.     Sensory: Sensation is intact.     Motor: No weakness, tremor, abnormal muscle tone or pronator drift.     Coordination: Romberg sign positive (sways backwards wo self correction). Finger-Nose-Finger Test normal. Rapid alternating movements normal.     Gait: Gait abnormal (slow and wided based).     Deep Tendon Reflexes: Reflexes are normal and symmetric.     Comments: MOCA 21/30, missed clock drawing and 5 word recall     No results found for this or any previous visit (from the past 24 hour(s)).  No results found.   ASSESSMENT and PLAN  1. Acute cognitive decline 2. Unsteady gait 3. Urgency incontinence Patient with sx concerning for NPH. Labs to r/o metabolic/nutritional components, CT pending. Referral to neuro for further eval and treatment  - CBC with Differential/Platelet - Comprehensive metabolic panel - TSH - Vitamin B12 - Ambulatory referral to Neurology  4. Essential hypertension - metoprolol succinate (TOPROL-XL) 50 MG 24 hr tablet; TAKE 1 TABLET DAILY WITH OR IMMEDIATELY FOLLOWING A MEAL.  5. Tachycardia - metoprolol succinate (TOPROL-XL) 50 MG 24 hr  tablet; TAKE 1 TABLET DAILY WITH OR IMMEDIATELY FOLLOWING A MEAL.  6. Dyslipidemia Controlled. Continue current regime.   7. Other symptoms and signs involving the nervous system - CT Head Wo Contrast; Future  Other orders - albuterol (PROAIR HFA) 108 (90 Base) MCG/ACT inhaler; Inhale 1-2 puffs into the lungs every 6 (six) hours as needed for wheezing or shortness of breath. - alendronate (FOSAMAX) 70 MG tablet; TAKE 1 TABLET EVERY 7 DAYS. TAKE WITH A FULL GLASS OF WATER ON AN EMPTY STOMACH. - beclomethasone (QVAR REDIHALER) 40 MCG/ACT inhaler; Inhale 1 puff into the lungs 2 (two) times daily. - clopidogrel (PLAVIX) 75 MG tablet; Take 1 tablet (75 mg total) by mouth daily. - furosemide (LASIX) 20 MG tablet; Take 1 tablet (20 mg total) by mouth daily. - pravastatin (PRAVACHOL) 40 MG tablet; Take 1 tablet (40 mg total) by mouth daily. - traZODone (DESYREL) 50 MG tablet; Take 1-2 tablets (50-100 mg total) by mouth at bedtime. - triamcinolone (KENALOG) 0.025 % ointment; Apply 1 application topically 2 (two) times daily.  Return for after neuro.    Rutherford Guys, MD Primary Care at [place], [place] [address] Ph.  [phone] Fax [phone]

## 2019-11-20 LAB — COMPREHENSIVE METABOLIC PANEL
ALT: 8 IU/L (ref 0–44)
AST: 14 IU/L (ref 0–40)
Albumin/Globulin Ratio: 1.3 (ref 1.2–2.2)
Albumin: 3.9 g/dL (ref 3.6–4.6)
Alkaline Phosphatase: 86 IU/L (ref 39–117)
BUN/Creatinine Ratio: 18 (ref 10–24)
BUN: 19 mg/dL (ref 8–27)
Bilirubin Total: 0.2 mg/dL (ref 0.0–1.2)
CO2: 27 mmol/L (ref 20–29)
Calcium: 9.6 mg/dL (ref 8.6–10.2)
Chloride: 99 mmol/L (ref 96–106)
Creatinine, Ser: 1.05 mg/dL (ref 0.76–1.27)
GFR calc Af Amer: 74 mL/min/{1.73_m2} (ref >59)
GFR calc non Af Amer: 64 mL/min/{1.73_m2} (ref >59)
Globulin, Total: 2.9 g/dL (ref 1.5–4.5)
Glucose: 96 mg/dL (ref 65–99)
Potassium: 4.2 mmol/L (ref 3.5–5.2)
Sodium: 139 mmol/L (ref 134–144)
Total Protein: 6.8 g/dL (ref 6.0–8.5)

## 2019-11-20 LAB — CBC WITH DIFFERENTIAL/PLATELET
Basophils Absolute: 0.1 10*3/uL (ref 0.0–0.2)
Basos: 1 %
EOS (ABSOLUTE): 0.8 10*3/uL — ABNORMAL HIGH (ref 0.0–0.4)
Eos: 9 %
Hematocrit: 42.5 % (ref 37.5–51.0)
Hemoglobin: 14.4 g/dL (ref 13.0–17.7)
Immature Grans (Abs): 0 10*3/uL (ref 0.0–0.1)
Immature Granulocytes: 0 %
Lymphocytes Absolute: 1.5 10*3/uL (ref 0.7–3.1)
Lymphs: 17 %
MCH: 31.4 pg (ref 26.6–33.0)
MCHC: 33.9 g/dL (ref 31.5–35.7)
MCV: 93 fL (ref 79–97)
Monocytes Absolute: 0.6 10*3/uL (ref 0.1–0.9)
Monocytes: 7 %
Neutrophils Absolute: 5.8 10*3/uL (ref 1.4–7.0)
Neutrophils: 66 %
Platelets: 222 10*3/uL (ref 150–450)
RBC: 4.59 x10E6/uL (ref 4.14–5.80)
RDW: 11.9 % (ref 11.6–15.4)
WBC: 8.7 10*3/uL (ref 3.4–10.8)

## 2019-11-20 LAB — TSH: TSH: 2.58 u[IU]/mL (ref 0.450–4.500)

## 2019-11-20 LAB — VITAMIN B12: Vitamin B-12: 336 pg/mL (ref 232–1245)

## 2019-11-22 ENCOUNTER — Encounter: Payer: Self-pay | Admitting: Neurology

## 2019-12-05 DIAGNOSIS — J449 Chronic obstructive pulmonary disease, unspecified: Secondary | ICD-10-CM | POA: Diagnosis not present

## 2019-12-05 DIAGNOSIS — R4182 Altered mental status, unspecified: Secondary | ICD-10-CM | POA: Diagnosis not present

## 2019-12-08 DIAGNOSIS — R4182 Altered mental status, unspecified: Secondary | ICD-10-CM | POA: Diagnosis not present

## 2019-12-16 ENCOUNTER — Ambulatory Visit: Payer: Medicare HMO | Admitting: Adult Health

## 2019-12-27 ENCOUNTER — Other Ambulatory Visit: Payer: Self-pay | Admitting: Family Medicine

## 2020-01-02 DIAGNOSIS — R4182 Altered mental status, unspecified: Secondary | ICD-10-CM | POA: Diagnosis not present

## 2020-01-02 DIAGNOSIS — J449 Chronic obstructive pulmonary disease, unspecified: Secondary | ICD-10-CM | POA: Diagnosis not present

## 2020-01-05 DIAGNOSIS — R4182 Altered mental status, unspecified: Secondary | ICD-10-CM | POA: Diagnosis not present

## 2020-01-11 ENCOUNTER — Other Ambulatory Visit: Payer: Self-pay

## 2020-01-11 ENCOUNTER — Telehealth (INDEPENDENT_AMBULATORY_CARE_PROVIDER_SITE_OTHER): Payer: Medicare HMO | Admitting: Family Medicine

## 2020-01-11 DIAGNOSIS — Z9189 Other specified personal risk factors, not elsewhere classified: Secondary | ICD-10-CM | POA: Diagnosis not present

## 2020-01-11 DIAGNOSIS — R1310 Dysphagia, unspecified: Secondary | ICD-10-CM | POA: Diagnosis not present

## 2020-01-11 MED ORDER — PREDNISONE 20 MG PO TABS: 40.0000 mg | ORAL_TABLET | Freq: Every day | ORAL | 0 refills | Status: DC

## 2020-01-11 NOTE — Progress Notes (Signed)
Virtual Visit Note  I connected with patient on 01/11/20 at 351pm by video that had to be converted to audio due to internet connectivity, doximity and verified that I am speaking with the correct person using two identifiers. [name] is currently located at home and patient and caregiver is currently with them during visit. The provider, Rutherford Guys, MD is located in their office at time of visit.  I discussed the limitations, risks, security and privacy concerns of performing an evaluation and management service by telephone and the availability of in person appointments. I also discussed with the patient that there may be a patient responsible charge related to this service. The patient expressed understanding and agreed to proceed.   I provided 35 minutes of non-face-to-face time during this encounter.  CC: cough with SOB  HPI ? PMH: HTN, asthma, BPH, CKD, HLP, CVA with dysphagia and risk of aspiration, osteoporosis  Patient denies concerns Care taker and daughter concerns for coughing and wheezing when eating solids or fluids Speech therapy has been involved Caretaker eats with him Eating solid foods all textures, did not like thickener Very fast eater, tends to "shovel Food" Daughter has noticed increased breathing when walking  No chest pain, no edema No fever or chills No changes in speech, no wheezing other than eating Caretaker reports that today was the worse episode  Has not been using albuterol - I asked for them to use albuterol, they reports that it has helped, breathing has calmed down  Allergies  Allergen Reactions  . Other Shortness Of Breath, Itching and Other (See Comments)    Seasonal allergies- Runny nose, congestion, itchy eyes, and wheezing    Prior to Admission medications   Medication Sig Start Date End Date Taking? Authorizing Provider  acetaminophen (TYLENOL) 500 MG tablet Take 500-1,000 mg by mouth as needed for mild pain or headache (pain  from eczema).    Yes [provider]  albuterol (PROAIR HFA) 108 (90 Base) MCG/ACT inhaler Inhale 1-2 puffs into the lungs every 6 (six) hours as needed for wheezing or shortness of breath. 11/19/19  Yes Rutherford Guys, MD  alendronate (FOSAMAX) 70 MG tablet TAKE 1 TABLET EVERY 7 DAYS. TAKE WITH A FULL GLASS OF WATER ON AN EMPTY STOMACH. 12/27/19  Yes Rutherford Guys, MD  beclomethasone (QVAR REDIHALER) 40 MCG/ACT inhaler Inhale 1 puff into the lungs 2 (two) times daily. 11/19/19  Yes Rutherford Guys, MD  Blood Pressure KIT Please check BP twice a day 03/25/19  Yes Rutherford Guys, MD  clopidogrel (PLAVIX) 75 MG tablet Take 1 tablet (75 mg total) by mouth daily. 11/19/19  Yes Rutherford Guys, MD  docusate sodium (COLACE) 100 MG capsule Take 1 capsule (100 mg total) by mouth daily as needed for mild constipation. 07/21/19  Yes Hongalgi, Lenis Dickinson, MD  furosemide (LASIX) 20 MG tablet Take 1 tablet (20 mg total) by mouth daily. 11/19/19  Yes Rutherford Guys, MD  metoprolol succinate (TOPROL-XL) 50 MG 24 hr tablet TAKE 1 TABLET DAILY WITH OR IMMEDIATELY FOLLOWING A MEAL. 11/19/19  Yes Rutherford Guys, MD  Misc. Devices [facility]) MISC Use walker whenever walking, standard walker 07/27/18  Yes Rutherford Guys, MD  pravastatin (PRAVACHOL) 40 MG tablet Take 1 tablet (40 mg total) by mouth daily. 11/19/19  Yes Rutherford Guys, MD  traZODone (DESYREL) 50 MG tablet Take 1-2 tablets (50-100 mg total) by mouth at bedtime. 11/19/19  Yes Rutherford Guys, MD  triamcinolone (KENALOG) 0.025 % ointment Apply 1 application topically 2 (two) times daily. 11/19/19  Yes Rutherford Guys, MD  triamcinolone ointment (KENALOG) 0.1 % SMARTSIG:1 Application Topical 2-3 Times Daily 01/03/20   [provider]    Past Medical History:  Diagnosis Date  . Allergy    seasonal  fall  . Asthma    as child  . Cancer ([place])    Basal cell carcinoma; multiple  . Dyspnea   . Eczema   . Hyperlipidemia   . Hypertension     . MSSA bacteremia 2020  . Osteoporosis   . Pleural effusion due to CHF (congestive heart failure) ([place])   . Stroke [facility])     Past Surgical History:  Procedure Laterality Date  . CATARACT EXTRACTION, BILATERAL  06/29/2015  . INTRAMEDULLARY (IM) NAIL INTERTROCHANTERIC Left 03/30/2017   Procedure: INTRAMEDULLARY (IM) NAIL INTERTROCHANTRIC;  Surgeon: [name], MD;  Location: [place];  Service: Orthopedics;  Laterality: Left;  . INTRAMEDULLARY (IM) NAIL INTERTROCHANTERIC Right 11/12/2018   Procedure: INTRAMEDULLARY (IM) NAIL RIGHT INTERTROCHANTERIC HIP FRACTURE;  Surgeon: [name], MD;  Location: [place];  Service: Orthopedics;  Laterality: Right;  . MOHS SURGERY    . TEE WITHOUT CARDIOVERSION N/A 07/20/2019   Procedure: TRANSESOPHAGEAL ECHOCARDIOGRAM (TEE);  Surgeon: [name], MD;  Location[facility] ENDOSCOPY;  Service: Cardiovascular;  Laterality: N/A;  . TONSILLECTOMY  1940 maybe    Social History   Tobacco Use  . Smoking status: Never Smoker  . Smokeless tobacco: Never Used  Substance Use Topics  . Alcohol use: No    Comment: occas    Family History  Problem Relation Age of Onset  . Arthritis Mother   . Heart disease Mother 35  . Hypertension Father     ROS Per hpi  Objective  Vitals as reported by the patient: GEN: AAOx3, NAD HEENT: [place]/AT, pupils are symmetrical, EOMI, non-icteric sclera Resp: breathing comfortably, speaking in full sentences Skin: no rashes noted, no pallor Psych: good eye contact, normal mood and affect   ASSESSMENT and PLAN  1. Dysphagia, unspecified type 2. At risk for aspiration Patient with intermittent symptoms that are progressing, no fever or chills, or productive cough, breathing responded to albuterol. Discussed continue with techniques given by speech, starting soft mechanical diet, provided education/examples, prednisone for possible asthma flareup. RTC precautions reviewed.   Other orders - predniSONE (DELTASONE) 20 MG  tablet; Take 2 tablets (40 mg total) by mouth daily with breakfast.  FOLLOW-UP: prn   The above assessment and management plan was discussed with the patient. The patient verbalized understanding of and has agreed to the management plan. Patient is aware to call the clinic if symptoms persist or worsen. Patient is aware when to return to the clinic for a follow-up visit. Patient educated on when it is appropriate to go to the emergency department.     Rutherford Guys, MD Primary Care at [place], [place] [address] Ph.  [phone] Fax [phone]

## 2020-01-31 ENCOUNTER — Other Ambulatory Visit: Payer: Self-pay

## 2020-01-31 ENCOUNTER — Other Ambulatory Visit: Payer: Self-pay | Admitting: Family Medicine

## 2020-01-31 ENCOUNTER — Encounter: Payer: Self-pay | Admitting: Neurology

## 2020-01-31 ENCOUNTER — Ambulatory Visit (INDEPENDENT_AMBULATORY_CARE_PROVIDER_SITE_OTHER): Payer: Medicare HMO | Admitting: Neurology

## 2020-01-31 VITALS — BP 103/68 | HR 77 | Ht 66.0 in | Wt 150.0 lb

## 2020-01-31 DIAGNOSIS — R4189 Other symptoms and signs involving cognitive functions and awareness: Secondary | ICD-10-CM | POA: Diagnosis not present

## 2020-01-31 DIAGNOSIS — R2681 Unsteadiness on feet: Secondary | ICD-10-CM

## 2020-01-31 DIAGNOSIS — R270 Ataxia, unspecified: Secondary | ICD-10-CM | POA: Diagnosis not present

## 2020-01-31 NOTE — Patient Instructions (Signed)
1. Schedule MRI brain with and without contrast and MRI cervical spine without contrast  2. Continue 24/7 supportive care  3. Follow-up in 4-5 months, call for any changes  FALL PRECAUTIONS: Be cautious when walking. Scan the area for obstacles that may increase the risk of trips and falls. When getting up in the mornings, sit up at the edge of the bed for a few minutes before getting out of bed. Consider elevating the bed at the head end to avoid drop of blood pressure when getting up. Walk always in a well-lit room (use night lights in the walls). Avoid area rugs or power cords from appliances in the middle of the walkways. Use a walker or a cane if necessary and consider physical therapy for balance exercise. Get your eyesight checked regularly.  HOME SAFETY: Consider the safety of the kitchen when operating appliances like stoves, microwave oven, and blender. Consider having supervision and share cooking responsibilities until no longer able to participate in those. Accidents with firearms and other hazards in the house should be identified and addressed as well.  ABILITY TO BE LEFT ALONE: If patient is unable to contact 911 operator, consider using LifeLine, or when the need is there, arrange for someone to stay with patients. Smoking is a fire hazard, consider supervision or cessation. Risk of wandering should be assessed by caregiver and if detected at any point, supervision and safe proof recommendations should be instituted.   RECOMMENDATIONS FOR ALL PATIENTS WITH MEMORY PROBLEMS: 1. Continue to exercise (Recommend 30 minutes of walking everyday, or 3 hours every week) 2. Increase social interactions - continue going to [place] and enjoy social gatherings with friends and family 3. Eat healthy, avoid fried foods and eat more fruits and vegetables 4. Maintain adequate blood pressure, blood sugar, and blood cholesterol level. Reducing the risk of stroke and cardiovascular disease also helps  promoting better memory. 5. Avoid stressful situations. Live a simple life and avoid aggravations. Organize your time and prepare for the next day in anticipation. 6. Sleep well, avoid any interruptions of sleep and avoid any distractions in the bedroom that may interfere with adequate sleep quality 7. Avoid sugar, avoid sweets as there is a strong link between excessive sugar intake, diabetes, and cognitive impairment The Mediterranean diet has been shown to help patients reduce the risk of progressive memory disorders and reduces cardiovascular risk. This includes eating fish, eat fruits and green leafy vegetables, nuts like almonds and hazelnuts, walnuts, and also use olive oil. Avoid fast foods and fried foods as much as possible. Avoid sweets and sugar as sugar use has been linked to worsening of memory function.  There is always a concern of gradual progression of memory problems. If this is the case, then we may need to adjust level of care according to patient needs. Support, both to the patient and caregiver, should then be put into place.

## 2020-01-31 NOTE — Telephone Encounter (Signed)
Requested Prescriptions  Pending Prescriptions Disp Refills  . clopidogrel (PLAVIX) 75 MG tablet [Pharmacy Med Name: CLOPIDOGREL 75 MG TABLET] 90 tablet 1    Sig: TAKE 1 TABLET ONCE DAILY.     Hematology: Antiplatelets - clopidogrel Failed - 01/31/2020 10:02 AM      Failed - Evaluate AST, ALT within 2 months of therapy initiation.      Passed - ALT in normal range and within 360 days    ALT  Date Value Ref Range Status  11/19/2019 8 0 - 44 IU/L Final         Passed - AST in normal range and within 360 days    AST  Date Value Ref Range Status  11/19/2019 14 0 - 40 IU/L Final         Passed - HCT in normal range and within 180 days    Hematocrit  Date Value Ref Range Status  11/19/2019 42.5 37.5 - 51.0 % Final         Passed - HGB in normal range and within 180 days    Hemoglobin  Date Value Ref Range Status  11/19/2019 14.4 13.0 - 17.7 g/dL Final         Passed - PLT in normal range and within 180 days    Platelets  Date Value Ref Range Status  11/19/2019 222 150 - 450 x10E3/uL Final   Platelet Count, POC  Date Value Ref Range Status  07/30/2019 181 142 - 424 K/uL Final         Passed - Valid encounter within last 6 months    Recent Outpatient Visits          2 weeks ago Dysphagia, unspecified type   Primary Care at [facility], Lilia Argue, MD   2 months ago Acute cognitive decline   [facility] at [facility], Lilia Argue, MD   4 months ago Right shoulder pain, unspecified chronicity   [facility] at [facility], Lilia Argue, MD   5 months ago Gastroesophageal reflux disease, unspecified whether esophagitis present   [facility] at [facility], Lilia Argue, MD   6 months ago Wheezing   [facility] at [facility], Lilia Argue, MD             . alendronate (FOSAMAX) 70 MG tablet [Pharmacy Med Name: ALENDRONATE SODIUM 70 MG TAB] 4 tablet 0    Sig: TAKE 1 TABLET EVERY 7 DAYS. TAKE WITH A FULL GLASS OF WATER ON AN EMPTY STOMACH.     Endocrinology:   Bisphosphonates Failed - 01/31/2020 10:02 AM      Failed - Vitamin D in normal range and within 360 days    Vit D, 25-Hydroxy  Date Value Ref Range Status  03/30/2017 36.2 30.0 - 100.0 ng/mL Final    Comment:    (NOTE) Vitamin D deficiency has been defined by the [place] practice guideline as a level of serum 25-OH vitamin D less than 20 ng/mL (1,2). The Endocrine Society went on to further define vitamin D insufficiency as a level between 21 and 29 ng/mL (2). 1. IOM (Institute of Medicine). 2010. Dietary reference   intakes for calcium and D. [place]: The   [employer]. 2. Holick MF, Binkley [place], Bischoff-Ferrari HA, et al.   Evaluation, treatment, and prevention of vitamin D   deficiency: an Endocrine Society clinical practice   guideline. JCEM. 2011 Jul; 96(7):1911-30. Performed At: [facility] [phone]  [address] [place], [place] [id number] Lindon Romp MD [id number]          Passed - Ca in normal range and within 360 days    Calcium  Date Value Ref Range Status  11/19/2019 9.6 8.6 - 10.2 mg/dL Final   Calcium, Ion  Date Value Ref Range Status  07/04/2018 1.07 (L) 1.15 - 1.40 mmol/L Final         Passed - Valid encounter within last 12 months    Recent Outpatient Visits          2 weeks ago Dysphagia, unspecified type   [facility] at [facility], Lilia Argue, MD   2 months ago Acute cognitive decline   [facility] at [facility], Lilia Argue, MD   4 months ago Right shoulder pain, unspecified chronicity   [facility] at [facility], Lilia Argue, MD   5 months ago Gastroesophageal reflux disease, unspecified whether esophagitis present   [facility] at [facility], Lilia Argue, MD   6 months ago Wheezing   [facility] at [facility], Lilia Argue, MD

## 2020-01-31 NOTE — Progress Notes (Signed)
NEUROLOGY CONSULTATION NOTE  [name] MRN: [id number] DOB: Mar 01, 1932  Referring provider: [name] Primary care provider: [name]  Reason for consult:  Cognitive decline  Dear Dr Pamella Pert:  Thank you for your kind referral of [name] for consultation of the above symptoms. Although his history is well known to you, please allow me to reiterate it for the purpose of our medical record. The patient was accompanied to the clinic by his caregiver [name] who also provides collateral information. Records and images were personally reviewed where available.   HISTORY OF PRESENT ILLNESS: This is a pleasant 86 year old right-handed man with a history of hypertension, hyperlipidemia, stroke, 86 presenting for evaluation of concern for NPH with urinary incontinence, gait instability, and cognitive changes. His caregiver of 4 years, [name], is present to provide information. Records from Epic were also reviewed, patient was most recently seen at [facility] last July 86 year old [name] has been his caregiver for 4 years and reports significant decline in the past year. He states his memory is not as good as it used to be. He started having frequent falls in 2018. [name] reports he could walk with his walker and was independent doing things himself, until his last fall in January 2020 when he sustained a right hip fracture. He has been unable to walk independently since then, and has needed assistance with everything. He has 24/7 care, he is under video surveillance at all times by his daughter. His daughter fixes his medications for 2 weeks and [name] administers them daily. Her daughter manages finances. He never drove, he used to be very active biking everywhere. [name] reports he needs total care with dressing and bathing. She states "he's sharp," long-term memory is great, however he has had short term memory issues over the past year. They have to constantly remind him to slow down  eating to prevent choking spells. He would turn the remote control down when asked, then 10 seconds later "it is all the way to 100." He need assistance to get up from a chair, everything is always to the left to him. He cannot get out of bed at night with his O2 in his hospital bed. [name] reports urinary incontinence, he urinates soon after he drinks, his diaper is full every hour.   He was last seen by neurologist [name] in July 2020. He had a right basal ganglia infarct in March 2019 due to small vessel disease. He was in the ER in June 2020 for possible TIA when he had confusion and speech difficulties. I personally reviewed MRI brain no acute changes, there were small chronic infarcts, small vessel disease and diffuse atrophy. Echo unremarkable. CTA showed 60% atherosclerotic plaque in the right carotid bifurcation and 50% on the left. EEG normal. He was put on DAPT for 3 months, then Plavix alone. He was back in the hospital in July 2020 for delirium. No infections seen, he was started on Trazodone for sleep and started on O2 at night for restrictive lung disease.   Laboratory Data: Lab Results  Component Value Date   TSH 2.580 11/19/2019   Lab Results  Component Value Date   [id number] 109 11/19/2019     PAST MEDICAL HISTORY: Past Medical History:  Diagnosis Date  . Allergy    seasonal  fall  . Asthma    as child  . Cancer ([place])    Basal cell carcinoma; multiple  . Dyspnea   . Eczema   .  Hyperlipidemia   . Hypertension   . MSSA bacteremia 2020  . Osteoporosis   . Pleural effusion due to CHF (congestive heart failure) ([place])   . Stroke [facility])     PAST SURGICAL HISTORY: Past Surgical History:  Procedure Laterality Date  . CATARACT EXTRACTION, BILATERAL  06/29/2015  . INTRAMEDULLARY (IM) NAIL INTERTROCHANTERIC Left 03/30/2017   Procedure: INTRAMEDULLARY (IM) NAIL INTERTROCHANTRIC;  Surgeon: [name], MD;  Location: [place];  Service: Orthopedics;  Laterality: Left;    . INTRAMEDULLARY (IM) NAIL INTERTROCHANTERIC Right 11/12/2018   Procedure: INTRAMEDULLARY (IM) NAIL RIGHT INTERTROCHANTERIC HIP FRACTURE;  Surgeon: [name], MD;  Location: [place];  Service: Orthopedics;  Laterality: Right;  . MOHS SURGERY    . TEE WITHOUT CARDIOVERSION N/A 07/20/2019   Procedure: TRANSESOPHAGEAL ECHOCARDIOGRAM (TEE);  Surgeon: [name], MD;  Location: [facility] ENDOSCOPY;  Service: Cardiovascular;  Laterality: N/A;  . TONSILLECTOMY  1940 maybe    MEDICATIONS: Current Outpatient Medications on File Prior to Visit  Medication Sig Dispense Refill  . acetaminophen (TYLENOL) 500 MG tablet Take 500-1,000 mg by mouth as needed for mild pain or headache (pain from eczema).     Marland Kitchen albuterol (PROAIR HFA) 108 (90 Base) MCG/ACT inhaler Inhale 1-2 puffs into the lungs every 6 (six) hours as needed for wheezing or shortness of breath. 8 g 3  . beclomethasone (QVAR REDIHALER) 40 MCG/ACT inhaler Inhale 1 puff into the lungs 2 (two) times daily. 10.6 g 3  . Blood Pressure KIT Please check BP twice a day 1 each 0  . docusate sodium (COLACE) 100 MG capsule Take 1 capsule (100 mg total) by mouth daily as needed for mild constipation.    . furosemide (LASIX) 20 MG tablet Take 1 tablet (20 mg total) by mouth daily. 30 tablet 2  . metoprolol succinate (TOPROL-XL) 50 MG 24 hr tablet TAKE 1 TABLET DAILY WITH OR IMMEDIATELY FOLLOWING A MEAL. 90 tablet 0  . Misc. Devices (WALKER) MISC Use walker whenever walking, standard walker 1 each 0  . pravastatin (PRAVACHOL) 40 MG tablet Take 1 tablet (40 mg total) by mouth daily. 90 tablet 0  . traZODone (DESYREL) 50 MG tablet Take 1-2 tablets (50-100 mg total) by mouth at bedtime. 180 tablet 1  . triamcinolone (KENALOG) 0.025 % ointment Apply 1 application topically 2 (two) times daily. 30 g 5  . triamcinolone ointment (KENALOG) 0.1 % SMARTSIG:1 Application Topical 2-3 Times Daily     No current facility-administered medications on file prior to visit.     ALLERGIES: Allergies  Allergen Reactions  . Other Shortness Of Breath, Itching and Other (See Comments)    Seasonal allergies- Runny nose, congestion, itchy eyes, and wheezing    FAMILY HISTORY: Family History  Problem Relation Age of Onset  . Arthritis Mother   . Heart disease Mother 64  . Hypertension Father     SOCIAL HISTORY: Social History   Socioeconomic History  . Marital status: Married    Spouse name: Not on file  . Number of children: 2  . Years of education: Not on file  . Highest education level: Not on file  Occupational History  . Occupation: retired  Tobacco Use  . Smoking status: Never Smoker  . Smokeless tobacco: Never Used  Substance and Sexual Activity  . Alcohol use: No    Comment: occas  . Drug use: No  . Sexual activity: Not Currently  Other Topics Concern  . Not on file  Social History Narrative  Marital status: divorced; good friend with ex-wife; not dating      Children: 2 children; 4 grandchildren; no gg      Lives:  Alone in house; daughter six blocks away      Employment: college professor; retired in 1997; history; [place]:  Never      Alcohol: tequila loves but won't buy it.      Exercise:  As much as can; previous competitive biking.      ADLs: quit mowing grass in 2017; no assistant devices; cooks and cleans and pays bills.  No driving; HAS NEVER DRIVEN; has always ridden bike.  Daughter takes patient to grocery store and to appointments.  Daughter drives patient to grocery store.      Advanced Directives:  No living; DNR; do not resuscitate.  HCPOA: [name] Cummings   Social Determinants of Health   Financial Resource Strain:   . Difficulty of Paying Living Expenses:   Food Insecurity:   . Worried About Charity fundraiser in the Last Year:   . Arboriculturist in the Last Year:   Transportation Needs:   . Film/video editor (Medical):   Marland Kitchen Lack of Transportation (Non-Medical):   Physical Activity:    . Days of Exercise per Week:   . Minutes of Exercise per Session:   Stress:   . Feeling of Stress :   Social Connections:   . Frequency of Communication with Friends and Family:   . Frequency of Social Gatherings with Friends and Family:   . Attends Religious Services:   . Active Member of Clubs or Organizations:   . Attends Archivist Meetings:   Marland Kitchen Marital Status:   Intimate Partner Violence: Not At Risk  . Fear of Current or Ex-Partner: No  . Emotionally Abused: No  . Physically Abused: No  . Sexually Abused: No    REVIEW OF SYSTEMS: Constitutional: No fevers, chills, or sweats, no generalized fatigue, change in appetite Eyes: No visual changes, double vision, eye pain Ear, nose and throat: No hearing loss, ear pain, nasal congestion, sore throat Cardiovascular: No chest pain, palpitations Respiratory:  No shortness of breath at rest or with exertion, wheezes GastrointestinaI: No nausea, vomiting, diarrhea, abdominal pain, fecal incontinence Genitourinary:  No dysuria, urinary retention or frequency Musculoskeletal:  No neck pain, back pain Integumentary: No rash, pruritus, skin lesions Neurological: as above Psychiatric: No depression, insomnia, anxiety Endocrine: No palpitations, fatigue, diaphoresis, mood swings, change in appetite, change in weight, increased thirst Hematologic/Lymphatic:  No anemia, purpura, petechiae. Allergic/Immunologic: no itchy/runny eyes, nasal congestion, recent allergic reactions, rashes  PHYSICAL EXAM: Vitals:   01/31/20 1053  BP: 103/68  Pulse: 77  SpO2: 97%   General: No acute distress Head:  Normocephalic/atraumatic Skin/Extremities: No rash, no edema Neurological Exam: Mental status: alert and oriented to person, place, and time, no dysarthria or aphasia, Fund of knowledge is appropriate.  Recent and remote memory are impaired.  Attention and concentration are normal. SLUMS score 24/30 [employer] Mental Exam  01/31/2020  Weekday Correct 1  Current year 1  What state are we in? 1  Amount spent 1  Amount left 2  # of Animals 3  5 objects recall 3  Number series 2  Hour markers 2  Time correct 2  Placed X in triangle correctly 1  Largest Figure 1  Name of male 0  Date back to work 2  Type of work 0  State she lived in 2  Total score 24    Cranial nerves: CN I: not tested CN II: pupils equal, round and reactive to light, visual fields intact CN III, IV, VI:  full range of motion, no nystagmus, no ptosis CN V: facial sensation intact CN VII: upper and lower face symmetric CN VIII: hearing intact to conversation Bulk & Tone: normal, no fasciculations, no cogwheeling Motor: 5/5 throughout with no pronator drift. Sensation: intact to light touch, cold, pin, vibration and joint position sense.  No extinction to double simultaneous stimulation.  Deep Tendon Reflexes: +2 throughout, no ankle clonus Plantar responses: downgoing bilaterally Cerebellar: no incoordination on finger to nose testing Gait: very ataxic when he stands, needs 1-2 person assist to stand Tremor: left resting tremor, no postural or action tremor seen   IMPRESSION: This is a pleasant 86 year old right-handed man with a history of hypertension, hyperlipidemia, stroke, 86 presenting for evaluation of concern for NPH with urinary incontinence, gait instability, and cognitive changes. His neurological exam shows significant ataxia, no focal weakness. SLUMS score today 24/30. Symptoms started a year ago, but have progressively worsened. He had an MRI brain in June 2020 with no evidence of NPH. Etiology of symptoms unclear, MRI brain and cervical spine will be ordered to assess for underlying structural abnormality. He has 24/7 care, continue supportive care. Follow-up in 4-5 months, they know to call for any changes.   Thank you for allowing me to participate in the care of this patient. Please do not hesitate to call for any  questions or concerns.   Ellouise Newer, M.D.  CC: [name]

## 2020-02-02 DIAGNOSIS — J449 Chronic obstructive pulmonary disease, unspecified: Secondary | ICD-10-CM | POA: Diagnosis not present

## 2020-02-02 DIAGNOSIS — R4182 Altered mental status, unspecified: Secondary | ICD-10-CM | POA: Diagnosis not present

## 2020-02-05 DIAGNOSIS — R4182 Altered mental status, unspecified: Secondary | ICD-10-CM | POA: Diagnosis not present

## 2020-02-06 ENCOUNTER — Encounter: Payer: Self-pay | Admitting: Neurology

## 2020-02-14 ENCOUNTER — Telehealth: Payer: Self-pay | Admitting: Family Medicine

## 2020-02-14 NOTE — Telephone Encounter (Signed)
Error

## 2020-02-15 ENCOUNTER — Encounter: Payer: Self-pay | Admitting: Registered Nurse

## 2020-02-15 ENCOUNTER — Telehealth: Payer: Medicare HMO | Admitting: Registered Nurse

## 2020-02-15 ENCOUNTER — Other Ambulatory Visit: Payer: Self-pay | Admitting: Registered Nurse

## 2020-02-15 ENCOUNTER — Other Ambulatory Visit: Payer: Self-pay

## 2020-02-15 ENCOUNTER — Telehealth (INDEPENDENT_AMBULATORY_CARE_PROVIDER_SITE_OTHER): Payer: Medicare HMO | Admitting: Registered Nurse

## 2020-02-15 VITALS — Wt 136.5 lb

## 2020-02-15 DIAGNOSIS — J302 Other seasonal allergic rhinitis: Secondary | ICD-10-CM

## 2020-02-15 DIAGNOSIS — J4521 Mild intermittent asthma with (acute) exacerbation: Secondary | ICD-10-CM

## 2020-02-15 DIAGNOSIS — R0989 Other specified symptoms and signs involving the circulatory and respiratory systems: Secondary | ICD-10-CM | POA: Diagnosis not present

## 2020-02-15 MED ORDER — MONTELUKAST SODIUM 10 MG PO TABS: 10.0000 mg | ORAL_TABLET | Freq: Every day | ORAL | 3 refills | Status: AC

## 2020-02-15 MED ORDER — AZITHROMYCIN 250 MG PO TABS: ORAL_TABLET | ORAL | 0 refills | Status: DC

## 2020-02-15 MED ORDER — GUAIFENESIN-DM 100-10 MG/5ML PO SYRP: 5.0000 mL | ORAL_SOLUTION | Freq: Three times a day (TID) | ORAL | 0 refills | Status: AC | PRN

## 2020-02-15 MED ORDER — BECLOMETHASONE DIPROPIONATE 80 MCG/ACT IN AERS: 2.0000 | INHALATION_SPRAY | Freq: Two times a day (BID) | RESPIRATORY_TRACT | 12 refills | Status: DC

## 2020-02-15 MED ORDER — QVAR REDIHALER 40 MCG/ACT IN AERB: 1.0000 | INHALATION_SPRAY | Freq: Two times a day (BID) | RESPIRATORY_TRACT | 3 refills | Status: DC

## 2020-02-15 NOTE — Progress Notes (Signed)
Telemedicine Encounter- SOAP NOTE Established Patient  This telephone encounter was conducted with the patient's (or proxy's) verbal consent via audio telecommunications: yes  Patient was instructed to have this encounter in a suitably private space; and to only have persons present to whom they give permission to participate. In addition, patient identity was confirmed by use of name plus two identifiers (DOB and address).  I discussed the limitations, risks, security and privacy concerns of performing an evaluation and management service by telephone and the availability of in person appointments. I also discussed with the patient that there may be a patient responsible charge related to this service. The patient expressed understanding and agreed to proceed.  I spent a total of 13 minutes talking with the patient or their proxy.  Chief Complaint  Patient presents with  . Cough    patient states for the last week on and off he has been experiencing  coughing , shortness of breath, wheezing , and feeling a little down. Per patient he have been taking prednisone but now its starting to get worse     Subjective   [name] is a 86 y.o. established patient. Telephone visit today for cough and shob  HPI Has been worsening over the past week.  Has been a chronic issue for him in the past, as [name] has had him on prednisone '40mg'$  PO qd with breakfast to address this However, his caretaker and his daughter have both noticed an increased frequency to his wheezing, coughing episodes, and yellow tinged sputum in the past week He has been known to aspirate food as he will not change to a liquid or thickened liquid diet His appetite has been decreased in the past week as well No fevers, chills, cyanosis, nvd, headaches, weakness, dependent edema, or other symptoms noted.  He does have a substantial history of seasonal allergies. He has been using albuterol prn, he is out of QVar at this  time  Patient Active Problem List   Diagnosis Date Noted  . MSSA bacteremia 07/15/2019  . Hypoxia   . Peripheral edema   . Thyroid nodule   . Acute respiratory failure with hypoxia ([place]) 07/14/2019  . HCAP (healthcare-associated pneumonia) 07/14/2019  . Thyroid mass 07/14/2019  . Diaphragm paralysis 05/04/2019  . Restrictive lung disease 05/04/2019  . Altered mental status 04/30/2019  . Confusion 04/30/2019  . Abnormal weight loss 03/23/2019  . Pain in joint of left shoulder 03/23/2019  . Frequent urination 03/23/2019  . Constipation 03/23/2019  . TIA (transient ischemic attack) 03/08/2019  . Hyponatremia 03/08/2019  . Impaired mobility 01/13/2019  . Seasonal allergies 01/13/2019  . Hiatal hernia 01/13/2019  . Pain in right hip 12/24/2018  . S/P right hip fracture 11/11/2018  . Recurrent falls 05/12/2018  . History of CVA (cerebrovascular accident) 01/16/2018  . Nocturnal enuresis   . Fall   . Full incontinence of feces   . Essential hypertension   . Hypoalbuminemia due to protein-calorie malnutrition ([place])   . Abnormality of gait   . Leg edema   . Hip fracture ([place]) 03/30/2017  . Leukocytosis 03/29/2017  . Mild intermittent asthma with acute exacerbation 01/29/2017  . Chronic renal insufficiency 09/22/2015  . Tachycardia 09/22/2015  . PVC (premature ventricular contraction) 09/22/2015  . Osteoporosis 06/03/2014  . Basal cell carcinoma of face 06/03/2014  . Benign prostatic hyperplasia 05/27/2013  . Eczema 04/23/2012  . Dyslipidemia 04/23/2012    Past Medical History:  Diagnosis Date  . Allergy  seasonal  fall  . Asthma    as child  . Cancer ([place])    Basal cell carcinoma; multiple  . Dyspnea   . Eczema   . Hyperlipidemia   . Hypertension   . MSSA bacteremia 2020  . Osteoporosis   . Pleural effusion due to CHF (congestive heart failure) ([place])   . Stroke [facility])     Current Outpatient Medications  Medication Sig Dispense Refill  . acetaminophen  (TYLENOL) 500 MG tablet Take 500-1,000 mg by mouth as needed for mild pain or headache (pain from eczema).     Marland Kitchen albuterol (PROAIR HFA) 108 (90 Base) MCG/ACT inhaler Inhale 1-2 puffs into the lungs every 6 (six) hours as needed for wheezing or shortness of breath. 8 g 3  . alendronate (FOSAMAX) 70 MG tablet TAKE 1 TABLET EVERY 7 DAYS. TAKE WITH A FULL GLASS OF WATER ON AN EMPTY STOMACH. 4 tablet 0  . Blood Pressure KIT Please check BP twice a day 1 each 0  . clopidogrel (PLAVIX) 75 MG tablet TAKE 1 TABLET ONCE DAILY. 90 tablet 1  . docusate sodium (COLACE) 100 MG capsule Take 1 capsule (100 mg total) by mouth daily as needed for mild constipation.    . furosemide (LASIX) 20 MG tablet Take 1 tablet (20 mg total) by mouth daily. 30 tablet 2  . metoprolol succinate (TOPROL-XL) 50 MG 24 hr tablet TAKE 1 TABLET DAILY WITH OR IMMEDIATELY FOLLOWING A MEAL. 90 tablet 0  . Misc. Devices (WALKER) MISC Use walker whenever walking, standard walker 1 each 0  . pravastatin (PRAVACHOL) 40 MG tablet Take 1 tablet (40 mg total) by mouth daily. 90 tablet 0  . traZODone (DESYREL) 50 MG tablet Take 1-2 tablets (50-100 mg total) by mouth at bedtime. 180 tablet 1  . triamcinolone (KENALOG) 0.025 % ointment Apply 1 application topically 2 (two) times daily. 30 g 5  . triamcinolone ointment (KENALOG) 0.1 % SMARTSIG:1 Application Topical 2-3 Times Daily    . azithromycin (ZITHROMAX) 250 MG tablet Take 2 on first day, then take 1 daily. Finish entire supply. 6 tablet 0  . beclomethasone (QVAR) 80 MCG/ACT inhaler Inhale 2 puffs into the lungs 2 (two) times daily. 1 Inhaler 12  . guaiFENesin-dextromethorphan (ROBITUSSIN DM) 100-10 MG/5ML syrup Take 5 mLs by mouth every 8 (eight) hours as needed for cough. 118 mL 0  . montelukast (SINGULAIR) 10 MG tablet Take 1 tablet (10 mg total) by mouth at bedtime. 30 tablet 3   No current facility-administered medications for this visit.    Allergies  Allergen Reactions  . Other  Shortness Of Breath, Itching and Other (See Comments)    Seasonal allergies- Runny nose, congestion, itchy eyes, and wheezing    Social History   Socioeconomic History  . Marital status: Married    Spouse name: Not on file  . Number of children: 2  . Years of education: Not on file  . Highest education level: Not on file  Occupational History  . Occupation: retired  Tobacco Use  . Smoking status: Never Smoker  . Smokeless tobacco: Never Used  Substance and Sexual Activity  . Alcohol use: No    Comment: occas  . Drug use: No  . Sexual activity: Not Currently  Other Topics Concern  . Not on file  Social History Narrative   Marital status: divorced; good friend with ex-wife; not dating      Children: 2 children; 4 grandchildren; no gg  Lives:  Alone in house; daughter six blocks away      Employment: college professor; retired in 1997; history; Engineer, manufacturing      Tobacco:  Never      Alcohol: tequila loves but won't buy it.      Exercise:  As much as can; previous competitive biking.      ADLs: quit mowing grass in 2017; no assistant devices; cooks and cleans and pays bills.  No driving; HAS NEVER DRIVEN; has always ridden bike.  Daughter takes patient to grocery store and to appointments.  Daughter drives patient to grocery store.      Advanced Directives:  No living; DNR; do not resuscitate.  HCPOA: [name] Cummings   Social Determinants of Health   Financial Resource Strain:   . Difficulty of Paying Living Expenses:   Food Insecurity:   . Worried About Charity fundraiser in the Last Year:   . Arboriculturist in the Last Year:   Transportation Needs:   . Film/video editor (Medical):   Marland Kitchen Lack of Transportation (Non-Medical):   Physical Activity:   . Days of Exercise per Week:   . Minutes of Exercise per Session:   Stress:   . Feeling of Stress :   Social Connections:   . Frequency of Communication with Friends and Family:   . Frequency of Social Gatherings  with Friends and Family:   . Attends Religious Services:   . Active Member of Clubs or Organizations:   . Attends Archivist Meetings:   Marland Kitchen Marital Status:   Intimate Partner Violence: Not At Risk  . Fear of Current or Ex-Partner: No  . Emotionally Abused: No  . Physically Abused: No  . Sexually Abused: No    ROS Per hpi   Objective   Vitals as reported by the patient: Today's Vitals   02/15/20 1017  Weight: 136 lb 8 oz (61.9 kg)    [place] was seen today for cough.  Diagnoses and all orders for this visit:  Mild intermittent asthma with acute exacerbation -     Discontinue: beclomethasone (QVAR REDIHALER) 40 MCG/ACT inhaler; Inhale 1 puff into the lungs 2 (two) times daily.  Seasonal allergies -     montelukast (SINGULAIR) 10 MG tablet; Take 1 tablet (10 mg total) by mouth at bedtime.  Chest congestion -     azithromycin (ZITHROMAX) 250 MG tablet; Take 2 on first day, then take 1 daily. Finish entire supply. -     guaiFENesin-dextromethorphan (ROBITUSSIN DM) 100-10 MG/5ML syrup; Take 5 mLs by mouth every 8 (eight) hours as needed for cough.   PLAN  Azithromycin for possible PNA  Montelukast before bed for allergies  Continue albuterol prn  Use gauifenesin-dextromethorphan sparingly as needed  Resume use of QVar twice daily  He has a follow up booked with [name], his PCP, on Friday, but we reviewed ER precautions for between now and then.  Patient encouraged to call clinic with any questions, comments, or concerns.   I discussed the assessment and treatment plan with the patient. The patient was provided an opportunity to ask questions and all were answered. The patient agreed with the plan and demonstrated an understanding of the instructions.   The patient was advised to call back or seek an in-person evaluation if the symptoms worsen or if the condition fails to improve as anticipated.  I provided 13 minutes of non-face-to-face time during  this encounter.  Maximiano Coss, NP  Primary Care at [facility]

## 2020-02-15 NOTE — Patient Instructions (Signed)
° ° ° °  If you have lab work done today you will be contacted with your lab results within the next 2 weeks.  If you have not heard from us then please contact us. The fastest way to get your results is to register for My Chart. ° ° °IF you received an x-ray today, you will receive an invoice from [facility]. Please contact [facility] at [phone] with questions or concerns regarding your invoice.  ° °IF you received labwork today, you will receive an invoice from [facility]. Please contact [facility] at [phone] with questions or concerns regarding your invoice.  ° °Our billing staff will not be able to assist you with questions regarding bills from these companies. ° °You will be contacted with the lab results as soon as they are available. The fastest way to get your results is to activate your My Chart account. Instructions are located on the last page of this paperwork. If you have not heard from us regarding the results in 2 weeks, please contact this office. °  ° ° ° °

## 2020-02-18 ENCOUNTER — Telehealth (INDEPENDENT_AMBULATORY_CARE_PROVIDER_SITE_OTHER): Payer: Medicare HMO | Admitting: Family Medicine

## 2020-02-18 ENCOUNTER — Other Ambulatory Visit: Payer: Self-pay

## 2020-02-18 ENCOUNTER — Encounter: Payer: Self-pay | Admitting: Family Medicine

## 2020-02-18 VITALS — Temp 98.5°F | Ht 66.5 in | Wt 136.5 lb

## 2020-02-18 DIAGNOSIS — R0989 Other specified symptoms and signs involving the circulatory and respiratory systems: Secondary | ICD-10-CM | POA: Diagnosis not present

## 2020-02-18 DIAGNOSIS — Z8673 Personal history of transient ischemic attack (TIA), and cerebral infarction without residual deficits: Secondary | ICD-10-CM | POA: Diagnosis not present

## 2020-02-18 DIAGNOSIS — J302 Other seasonal allergic rhinitis: Secondary | ICD-10-CM

## 2020-02-18 DIAGNOSIS — Z9189 Other specified personal risk factors, not elsewhere classified: Secondary | ICD-10-CM

## 2020-02-18 NOTE — Progress Notes (Signed)
Virtual Visit Note  I connected with patient on 02/18/20 at 610pm by video doximity and verified that I am speaking with the correct person using two identifiers. [name] is currently located at home and patient, daughter and caretaker is currently with them during visit. The provider, Wesley Guys, Moore is located in their office at time of visit.  I discussed the limitations, risks, security and privacy concerns of performing an evaluation and management service by telephone and the availability of in person appointments. I also discussed with the patient that there may be a patient responsible charge related to this service. The patient expressed understanding and agreed to proceed.   I provided 20 minutes of non-face-to-face time during this encounter.  Chief Complaint  Patient presents with  . Follow-up    [name], pts caretaker stated that the pt is coughing, wheezing, fatigue, eating habbits have gone dw.     HPI  PMH: HTN, asthma, BPH, CKD, HLP, CVA with dysphagia and risk of aspiration, osteoporosis?  Had OV 2 days ago with Wesley Mustard, NP for asthma Started singulair, azithromycin for possible PNA and robitussin DM  Caregiver reports that patient has been coughing thick yellow- brown phelgm Phlegm is thicker but getting less ferquent No fever, weight stable, mild decreased in appetite  Completes abx tomorrow Robitussin DM once a day helps, coughing now mostly with eating Drinking fluids, does not like thickener Having sore throat 5/10 Has been using oxygen prn after coughing, reports it helps Does not have pulse ox Runny nose and watery eyes better with singulair  Saw Dr [name], neuro April 2021 - MRI brain and cervical spine  Allergies  Allergen Reactions  . Other Shortness Of Breath, Itching and Other (See Comments)    Seasonal allergies- Runny nose, congestion, itchy eyes, and wheezing    Prior to Admission medications   Medication Sig Start Date End Date  Taking? Authorizing Provider  acetaminophen (TYLENOL) 500 MG tablet Take 500-1,000 mg by mouth as needed for mild Moore or headache (Moore from eczema).    Yes [name]  albuterol (PROAIR HFA) 108 (90 Base) MCG/ACT inhaler Inhale 1-2 puffs into the lungs every 6 (six) hours as needed for wheezing or shortness of breath. 11/19/19  Yes Wesley Guys, Moore  alendronate (FOSAMAX) 70 MG tablet TAKE 1 TABLET EVERY 7 DAYS. TAKE WITH A FULL GLASS OF WATER ON AN EMPTY STOMACH. 01/31/20  Yes Wesley Guys, Moore  azithromycin (ZITHROMAX) 250 MG tablet Take 2 on first day, then take 1 daily. Finish entire supply. 02/15/20  Yes Wesley Coss, NP  beclomethasone (QVAR) 80 MCG/ACT inhaler Inhale 2 puffs into the lungs 2 (two) times daily. 02/15/20  Yes Wesley Coss, NP  Blood Pressure KIT Please check BP twice a day 03/25/19  Yes Wesley Guys, Moore  clopidogrel (PLAVIX) 75 MG tablet TAKE 1 TABLET ONCE DAILY. 01/31/20  Yes Wesley Guys, Moore  docusate sodium (COLACE) 100 MG capsule Take 1 capsule (100 mg total) by mouth daily as needed for mild constipation. 07/21/19  Yes [name], Lenis Dickinson, Moore  furosemide (LASIX) 20 MG tablet Take 1 tablet (20 mg total) by mouth daily. 11/19/19  Yes Wesley Guys, Moore  guaiFENesin-dextromethorphan [facility] DM) 100-10 MG/5ML syrup Take 5 mLs by mouth every 8 (eight) hours as needed for cough. 02/15/20  Yes Wesley Coss, NP  metoprolol succinate (TOPROL-XL) 50 MG 24 hr tablet TAKE 1 TABLET DAILY WITH OR IMMEDIATELY FOLLOWING A MEAL. 11/19/19  Yes Wesley Guys, Moore  Misc. Devices [facility]) MISC Use walker whenever walking, standard walker 07/27/18  Yes Wesley Guys, Moore  montelukast (SINGULAIR) 10 MG tablet Take 1 tablet (10 mg total) by mouth at bedtime. 02/15/20  Yes Wesley Coss, NP  pravastatin (PRAVACHOL) 40 MG tablet Take 1 tablet (40 mg total) by mouth daily. 11/19/19  Yes Wesley Guys, Moore  traZODone (DESYREL) 50 MG tablet Take 1-2 tablets (50-100 mg  total) by mouth at bedtime. 11/19/19  Yes Wesley Guys, Moore  triamcinolone (KENALOG) 0.025 % ointment Apply 1 application topically 2 (two) times daily. 11/19/19  Yes Wesley Guys, Moore  triamcinolone ointment (KENALOG) 0.1 % SMARTSIG:1 Application Topical 2-3 Times Daily 01/03/20  Yes [name]    Past Medical History:  Diagnosis Date  . Allergy    seasonal  fall  . Asthma    as child  . Cancer ([place])    Basal cell carcinoma; multiple  . Dyspnea   . Eczema   . Hyperlipidemia   . Hypertension   . MSSA bacteremia 2020  . Osteoporosis   . Pleural effusion due to CHF (congestive heart failure) ([place])   . Stroke [facility])     Past Surgical History:  Procedure Laterality Date  . CATARACT EXTRACTION, BILATERAL  06/29/2015  . INTRAMEDULLARY (IM) NAIL INTERTROCHANTERIC Left 03/30/2017   Procedure: INTRAMEDULLARY (IM) NAIL INTERTROCHANTRIC;  Surgeon: Wesley Pel, Moore;  Location: [place];  Service: Orthopedics;  Laterality: Left;  . INTRAMEDULLARY (IM) NAIL INTERTROCHANTERIC Right 11/12/2018   Procedure: INTRAMEDULLARY (IM) NAIL RIGHT INTERTROCHANTERIC HIP FRACTURE;  Surgeon: Wesley Koyanagi, Moore;  Location: [place];  Service: Orthopedics;  Laterality: Right;  . MOHS SURGERY    . TEE WITHOUT CARDIOVERSION N/A 07/20/2019   Procedure: TRANSESOPHAGEAL ECHOCARDIOGRAM (TEE);  Surgeon: Wesley Pain, Moore;  Location: [facility] ENDOSCOPY;  Service: Cardiovascular;  Laterality: N/A;  . TONSILLECTOMY  1940 maybe    Social History   Tobacco Use  . Smoking status: Never Smoker  . Smokeless tobacco: Never Used  Substance Use Topics  . Alcohol use: No    Comment: occas    Family History  Problem Relation Age of Onset  . Arthritis Mother   . Heart disease Mother 71  . Hypertension Father     ROS Per hpi  Objective  Vitals as reported by the patient: none  GEN: AAOx3, NAD HEENT: [place]/AT, pupils are symmetrical, EOMI, non-icteric sclera Resp: breathing comfortably, speaking in full  sentences Skin: no rashes noted, no pallor Psych: good eye contact, normal mood and affect   ASSESSMENT and PLAN  1. Chest congestion Improving other than thicker phlegm.complete abx, increase use of robitussin DM to TID, push fluids, buy pulse ox, if worsening or not resolving, consider augmentin   2. Seasonal allergies Improved with singulair  3. At risk for aspiration 4. History of CVA (cerebrovascular accident) - Amb Referral to Palliative Care  FOLLOW-UP: prn   The above assessment and management plan was discussed with the patient. The patient verbalized understanding of and has agreed to the management plan. Patient is aware to call the clinic if symptoms persist or worsen. Patient is aware when to return to the clinic for a follow-up visit. Patient educated on when it is appropriate to go to the emergency department.     Wesley Guys, Moore Primary Care at [place], [place] [address] Ph.  [phone] Fax [phone]

## 2020-02-18 NOTE — Patient Instructions (Signed)
° ° ° °  If you have lab work done today you will be contacted with your lab results within the next 2 weeks.  If you have not heard from us then please contact us. The fastest way to get your results is to register for My Chart. ° ° °IF you received an x-ray today, you will receive an invoice from [facility]. Please contact [facility] at [phone] with questions or concerns regarding your invoice.  ° °IF you received labwork today, you will receive an invoice from [facility]. Please contact [facility] at [phone] with questions or concerns regarding your invoice.  ° °Our billing staff will not be able to assist you with questions regarding bills from these companies. ° °You will be contacted with the lab results as soon as they are available. The fastest way to get your results is to activate your My Chart account. Instructions are located on the last page of this paperwork. If you have not heard from us regarding the results in 2 weeks, please contact this office. °  ° ° ° °

## 2020-02-22 ENCOUNTER — Telehealth: Payer: Self-pay

## 2020-02-22 ENCOUNTER — Telehealth: Payer: Self-pay | Admitting: Internal Medicine

## 2020-02-22 ENCOUNTER — Other Ambulatory Visit: Payer: Self-pay | Admitting: *Deleted

## 2020-02-22 MED ORDER — FLOVENT HFA 110 MCG/ACT IN AERO: 2.0000 | INHALATION_SPRAY | Freq: Two times a day (BID) | RESPIRATORY_TRACT | 12 refills | Status: DC

## 2020-02-22 NOTE — Telephone Encounter (Signed)
Spoke with Langley Gauss from [facility] [employer], calling to confirm palliative or Hospice care for the pt. Confirmed palliative

## 2020-02-22 NOTE — Telephone Encounter (Signed)
Phone call placed to patient to offer to schedule a visit with [facility]. Phone rang, with no answer I left a voicemail for call back. 

## 2020-02-23 ENCOUNTER — Ambulatory Visit: Payer: Medicare HMO | Admitting: Adult Health

## 2020-02-23 ENCOUNTER — Other Ambulatory Visit: Payer: Self-pay

## 2020-02-23 ENCOUNTER — Encounter: Payer: Self-pay | Admitting: Family Medicine

## 2020-02-23 DIAGNOSIS — R0989 Other specified symptoms and signs involving the circulatory and respiratory systems: Secondary | ICD-10-CM

## 2020-02-23 NOTE — Telephone Encounter (Signed)
Patient is scheduled for a virtual appointment tomorrow morning with PCP.

## 2020-02-23 NOTE — Telephone Encounter (Signed)
Patient is still having some ccongestion but feels like another round of antibiotics would help . Going to call and try to schedule an appointment   Patient is requesting a refill of the following medications: Requested Prescriptions   Pending Prescriptions Disp Refills  . azithromycin (ZITHROMAX) 250 MG tablet 6 tablet 0    Sig: Take 2 on first day, then take 1 daily. Finish entire supply.    Date of patient request: 02/23/2020 Last office visit: 02/18/2020 Date of last refill: 02/15/2020 Last refill amount: 6 tablets Follow up time period per chart: PRN

## 2020-02-24 ENCOUNTER — Telehealth (INDEPENDENT_AMBULATORY_CARE_PROVIDER_SITE_OTHER): Payer: Medicare HMO | Admitting: Family Medicine

## 2020-02-24 ENCOUNTER — Other Ambulatory Visit: Payer: Self-pay

## 2020-02-24 ENCOUNTER — Encounter: Payer: Self-pay | Admitting: Family Medicine

## 2020-02-24 DIAGNOSIS — J4530 Mild persistent asthma, uncomplicated: Secondary | ICD-10-CM

## 2020-02-24 DIAGNOSIS — Z9189 Other specified personal risk factors, not elsewhere classified: Secondary | ICD-10-CM | POA: Diagnosis not present

## 2020-02-24 DIAGNOSIS — Z8673 Personal history of transient ischemic attack (TIA), and cerebral infarction without residual deficits: Secondary | ICD-10-CM | POA: Diagnosis not present

## 2020-02-24 DIAGNOSIS — R0602 Shortness of breath: Secondary | ICD-10-CM

## 2020-02-24 DIAGNOSIS — R630 Anorexia: Secondary | ICD-10-CM

## 2020-02-24 MED ORDER — FLOVENT HFA 110 MCG/ACT IN AERO: 2.0000 | INHALATION_SPRAY | Freq: Two times a day (BID) | RESPIRATORY_TRACT | 12 refills | Status: AC

## 2020-02-24 MED ORDER — MIRTAZAPINE 15 MG PO TABS: 15.0000 mg | ORAL_TABLET | Freq: Every day | ORAL | 3 refills | Status: AC

## 2020-02-24 NOTE — Progress Notes (Signed)
Virtual Visit Note  I connected with patient on 02/24/20 at 1122am by video and verified that I am speaking with the correct person using two identifiers. [name] is currently located at home and patientdaughter and caregiver is currently with them during visit. The provider, Rutherford Guys, MD is located in their office at time of visit.  I discussed the limitations, risks, security and privacy concerns of performing an evaluation and management service by telephone and the availability of in person appointments. I also discussed with the patient that there may be a patient responsible charge related to this service. The patient expressed understanding and agreed to proceed.   I provided 23 minutes of non-face-to-face time during this encounter.  Chief Complaint  Patient presents with  . Cough    congestion f/u- better / difficulty w/ swallowing- reports some pain  / drooling   . decrease in appetite    2 meals a day / plus ensure     HPI ? PMH: HTN, asthma, BPH, CKD, HLP, CVAwith dysphagia and risk of aspiration, osteoporosis  Last OV telemedicine - last week, complete abx, get pulse ox, increase robitussin DM, referral to palliative care  Mucous and cough are better Having increased drooling from right side of mouth, not closing mouth completely Worried re decrease in appetite and low energy Has added ensure No diarrhea, no abd pain, no nausea or vomiting Not walking anymore - very taxing No leg swelling, wheezing intermittently, using albuterol daily Has been off qvar x 1 week, have not received flovent No fever or chills, no mental status change Has been placed on 3L oxygen, seems to help They did not get pulse ox   MRI brain scheduled for may 4th   Most of h/o provided by daughter (POA) and care giver Patient states that his feels fine and has no problems with breathing or eating  Allergies  Allergen Reactions  . Other Shortness Of Breath, Itching and Other  (See Comments)    Seasonal allergies- Runny nose, congestion, itchy eyes, and wheezing    Prior to Admission medications   Medication Sig Start Date End Date Taking? Authorizing Provider  acetaminophen (TYLENOL) 500 MG tablet Take 500-1,000 mg by mouth as needed for mild pain or headache (pain from eczema).    Yes [provider]  albuterol (PROAIR HFA) 108 (90 Base) MCG/ACT inhaler Inhale 1-2 puffs into the lungs every 6 (six) hours as needed for wheezing or shortness of breath. 11/19/19  Yes Rutherford Guys, MD  alendronate (FOSAMAX) 70 MG tablet TAKE 1 TABLET EVERY 7 DAYS. TAKE WITH A FULL GLASS OF WATER ON AN EMPTY STOMACH. 01/31/20  Yes Rutherford Guys, MD  azithromycin (ZITHROMAX) 250 MG tablet Take 2 on first day, then take 1 daily. Finish entire supply. 02/15/20  Yes Maximiano Coss, NP  beclomethasone (QVAR) 80 MCG/ACT inhaler Inhale 2 puffs into the lungs 2 (two) times daily. 02/15/20  Yes Maximiano Coss, NP  Blood Pressure KIT Please check BP twice a day 03/25/19  Yes Rutherford Guys, MD  clopidogrel (PLAVIX) 75 MG tablet TAKE 1 TABLET ONCE DAILY. 01/31/20  Yes Rutherford Guys, MD  docusate sodium (COLACE) 100 MG capsule Take 1 capsule (100 mg total) by mouth daily as needed for mild constipation. 07/21/19  Yes Hongalgi, Lenis Dickinson, MD  fluticasone (FLOVENT HFA) 110 MCG/ACT inhaler Inhale 2 puffs into the lungs 2 (two) times daily. 02/22/20  Yes Maximiano Coss, NP  furosemide (LASIX) 20 MG  tablet Take 1 tablet (20 mg total) by mouth daily. 11/19/19  Yes Rutherford Guys, MD  guaiFENesin-dextromethorphan [facility] DM) 100-10 MG/5ML syrup Take 5 mLs by mouth every 8 (eight) hours as needed for cough. 02/15/20  Yes Maximiano Coss, NP  metoprolol succinate (TOPROL-XL) 50 MG 24 hr tablet TAKE 1 TABLET DAILY WITH OR IMMEDIATELY FOLLOWING A MEAL. 11/19/19  Yes Rutherford Guys, MD  Misc. Devices [facility] MISC Use walker whenever walking, standard walker 07/27/18  Yes Rutherford Guys, MD    montelukast (SINGULAIR) 10 MG tablet Take 1 tablet (10 mg total) by mouth at bedtime. 02/15/20  Yes Maximiano Coss, NP  pravastatin (PRAVACHOL) 40 MG tablet Take 1 tablet (40 mg total) by mouth daily. 11/19/19  Yes Rutherford Guys, MD  traZODone (DESYREL) 50 MG tablet Take 1-2 tablets (50-100 mg total) by mouth at bedtime. 11/19/19  Yes Rutherford Guys, MD  triamcinolone (KENALOG) 0.025 % ointment Apply 1 application topically 2 (two) times daily. 11/19/19  Yes Rutherford Guys, MD  triamcinolone ointment (KENALOG) 0.1 % SMARTSIG:1 Application Topical 2-3 Times Daily 01/03/20  Yes [provider]    Past Medical History:  Diagnosis Date  . Allergy    seasonal  fall  . Asthma    as child  . Cancer ([place])    Basal cell carcinoma; multiple  . Dyspnea   . Eczema   . Hyperlipidemia   . Hypertension   . MSSA bacteremia 2020  . Osteoporosis   . Pleural effusion due to CHF (congestive heart failure) ([place])   . Stroke [facility])     Past Surgical History:  Procedure Laterality Date  . CATARACT EXTRACTION, BILATERAL  06/29/2015  . INTRAMEDULLARY (IM) NAIL INTERTROCHANTERIC Left 03/30/2017   Procedure: INTRAMEDULLARY (IM) NAIL INTERTROCHANTRIC;  Surgeon: [name], MD;  Location: [place];  Service: Orthopedics;  Laterality: Left;  . INTRAMEDULLARY (IM) NAIL INTERTROCHANTERIC Right 11/12/2018   Procedure: INTRAMEDULLARY (IM) NAIL RIGHT INTERTROCHANTERIC HIP FRACTURE;  Surgeon: [name], MD;  Location: [place];  Service: Orthopedics;  Laterality: Right;  . MOHS SURGERY    . TEE WITHOUT CARDIOVERSION N/A 07/20/2019   Procedure: TRANSESOPHAGEAL ECHOCARDIOGRAM (TEE);  Surgeon: [name], MD;  [facility] ENDOSCOPY;  Service: Cardiovascular;  Laterality: N/A;  . TONSILLECTOMY  1940 maybe    Social History   Tobacco Use  . Smoking status: Never Smoker  . Smokeless tobacco: Never Used  Substance Use Topics  . Alcohol use: No    Comment: occas    Family History  Problem  Relation Age of Onset  . Arthritis Mother   . Heart disease Mother 40  . Hypertension Father     ROS Per hpi  Objective  Vitals as reported by the patient: none  GEN: AAOx3, NAD HEENT: [place]/AT, pupils are symmetrical, EOMI, non-icteric sclera Resp: breathing comfortably, speaking in full sentences Skin: no rashes noted, no pallor Psych: good eye contact, normal mood and affect   ASSESSMENT and PLAN  1. SOB (shortness of breath) Multifactorial. Off asthma meds, aspiration?  2. Mild persistent asthma, unspecified whether complicated Not controlled in setting of being off maintenance inhaler.   3. History of CVA (cerebrovascular accident) 4. At risk for aspiration MRI pending, given new drooling/droping. cont with eating precautions.  5. Decreased appetite Started on trial of remeron. Multifactorial. Discussed possible natural decline. Palliative care referral.  - TSH  Other orders - fluticasone (FLOVENT HFA) 110 MCG/ACT inhaler; Inhale 2 puffs into the lungs  2 (two) times daily. - mirtazapine (REMERON) 15 MG tablet; Take 1 tablet (15 mg total) by mouth at bedtime.  FOLLOW-UP: 1 week   The above assessment and management plan was discussed with the patient. The patient verbalized understanding of and has agreed to the management plan. Patient is aware to call the clinic if symptoms persist or worsen. Patient is aware when to return to the clinic for a follow-up visit. Patient educated on when it is appropriate to go to the emergency department.     Rutherford Guys, MD Primary Care at [place], [place] [address] Ph.  [phone] Fax [phone]

## 2020-02-24 NOTE — Patient Instructions (Signed)
° ° ° °  If you have lab work done today you will be contacted with your lab results within the next 2 weeks.  If you have not heard from us then please contact us. The fastest way to get your results is to register for My Chart. ° ° °IF you received an x-ray today, you will receive an invoice from [facility]. Please contact [facility] at [phone] with questions or concerns regarding your invoice.  ° °IF you received labwork today, you will receive an invoice from [facility]. Please contact [facility] at [phone] with questions or concerns regarding your invoice.  ° °Our billing staff will not be able to assist you with questions regarding bills from these companies. ° °You will be contacted with the lab results as soon as they are available. The fastest way to get your results is to activate your My Chart account. Instructions are located on the last page of this paperwork. If you have not heard from us regarding the results in 2 weeks, please contact this office. °  ° ° ° °

## 2020-02-28 ENCOUNTER — Encounter: Payer: Self-pay | Admitting: Family Medicine

## 2020-02-29 ENCOUNTER — Ambulatory Visit
Admission: RE | Admit: 2020-02-29 | Discharge: 2020-02-29 | Disposition: A | Discharge status: Home / Self Care | Payer: Medicare HMO | Source: Ambulatory Visit | Attending: Neurology | Admitting: Neurology

## 2020-02-29 ENCOUNTER — Other Ambulatory Visit: Payer: Self-pay

## 2020-02-29 DIAGNOSIS — R270 Ataxia, unspecified: Secondary | ICD-10-CM

## 2020-02-29 DIAGNOSIS — R2681 Unsteadiness on feet: Secondary | ICD-10-CM

## 2020-02-29 DIAGNOSIS — M4802 Spinal stenosis, cervical region: Secondary | ICD-10-CM | POA: Diagnosis not present

## 2020-02-29 DIAGNOSIS — R2689 Other abnormalities of gait and mobility: Secondary | ICD-10-CM | POA: Diagnosis not present

## 2020-02-29 DIAGNOSIS — R4189 Other symptoms and signs involving cognitive functions and awareness: Secondary | ICD-10-CM

## 2020-02-29 MED ORDER — GADOBENATE DIMEGLUMINE 529 MG/ML IV SOLN
14.0000 mL | Freq: Once | INTRAVENOUS | Status: AC | PRN
  Administered 2020-02-29: 14 mL via INTRAVENOUS

## 2020-02-29 NOTE — Telephone Encounter (Signed)
No action required.

## 2020-03-02 ENCOUNTER — Other Ambulatory Visit: Payer: Medicare HMO | Admitting: Internal Medicine

## 2020-03-02 ENCOUNTER — Other Ambulatory Visit: Payer: Self-pay

## 2020-03-02 DIAGNOSIS — R0602 Shortness of breath: Secondary | ICD-10-CM

## 2020-03-02 DIAGNOSIS — Z515 Encounter for palliative care: Secondary | ICD-10-CM | POA: Diagnosis not present

## 2020-03-02 NOTE — Progress Notes (Signed)
[facility] Consult Note Telephone: [phone]  Fax: [phone]  PATIENT NAME: [name] DOB: 02-Dec-1931 MRN: [id number]  PRIMARY CARE PROVIDER:   Rutherford Guys, MD  REFERRING PROVIDER:  Rutherford Guys, MD [address] [place],  [place] [address]  RESPONSIBLE PARTY:    [name] Daughter [phone]  [phone]       RECOMMENDATIONS and PLAN:  Palliative care encounter  Z51.5  1. Advance care planning:  Reiewed MOST form and confirmed pt's selections with he and daughter/POA.  His advanced directives are DNAR/DNI, limited additional interventions, IV fluids if indicated,  determine use of antibiotics, no tube feedings. MOST and DNR forms were signed and uploaded into chart. Palliative care will continue to f/u with patient.   2.  Dysphagia:  At risk of aspiration Encouraged use of thickener in beverages. Aspiration prevention reviewed,  Continue swallow exercises.  Monitor for signs of decline.  3.  Dyspnea:  Multifactorial. Pending chest xray.  R/O infectious process vs other pathology..  Continue Furosemide 63m daily, Consider home nebulizer treatments with Albuterol if increased use of rescue inhaler. Titrate oxygen use to maintain O2 level at 90% or greater. Monitor  3.  Weight loss:  Complicated by shortness of breath.  Offer nutritional shakes rather than solid foods when experiencing more shortness of breath.  Positive reports from initiation of Mirtazapine 175mq HS. Monitor weight weekly  I spent 60 miinutes providing this consultation,  from 1130 to 1230.  More than 50% of the time in this consultation was spent coordinating communication with pt, daughter and caregiver.   HISTORY OF PRESENT ILLNESS:  JoRasul Moore a [age].o. year old male with multiple medical problems including previous CVA, CHF, 23# wt loss since Sept. 2020.  Family also reports recent episodes of increased shortness of breath and coughing(  worse with exertion).  He has already been treated with antibiotics. He is due to have a CXR for further evaluation. He requires assistance with all ADLs and ambulates with a walker  Palliative Care was asked to help address goals of care.   CODE STATUS: DNAR/DNI  PPS: 40% weak HOSPICE ELIGIBILITY/DIAGNOSIS: TBD  PAST MEDICAL HISTORY:  Past Medical History:  Diagnosis Date  . Allergy    seasonal  fall  . Asthma    as child  . [place] ([place]   Basal cell carcinoma; multiple  . Dyspnea   . Eczema   . Hyperlipidemia   . Hypertension   . MSSA bacteremia 2020  . Osteoporosis   . Pleural effusion due to CHF (congestive heart failure) ([place]  . [facility]    SOCIAL HX:   ALLERGIES:  Allergies  Allergen Reactions  . Other Shortness Of Breath, Itching and Other (See Comments)    Seasonal allergies- Runny nose, congestion, itchy eyes, and wheezing     PERTINENT MEDICATIONS:  Outpatient Encounter Medications as of 03/02/2020  Medication Sig  . acetaminophen (TYLENOL) 500 MG tablet Take 500-1,000 mg by mouth as needed for mild pain or headache (pain from eczema).   . Marland Kitchenlbuterol (PROAIR HFA) 108 (90 Base) MCG/ACT inhaler Inhale 1-2 puffs into the lungs every 6 (six) hours as needed for wheezing or shortness of breath.  . Marland Kitchenlendronate (FOSAMAX) 70 MG tablet TAKE 1 TABLET EVERY 7 DAYS. TAKE WITH A FULL GLASS OF WATER ON AN EMPTY STOMACH.  . Marland Kitchenzithromycin (ZITHROMAX) 250 MG tablet Take 2 on first day, then take 1 daily.  Finish entire supply.  . beclomethasone (QVAR) 80 MCG/ACT inhaler Inhale 2 puffs into the lungs 2 (two) times daily.  . Blood Pressure KIT Please check BP twice a day  . clopidogrel (PLAVIX) 75 MG tablet TAKE 1 TABLET ONCE DAILY.  Marland Kitchen docusate sodium (COLACE) 100 MG capsule Take 1 capsule (100 mg total) by mouth daily as needed for mild constipation.  . fluticasone (FLOVENT HFA) 110 MCG/ACT inhaler Inhale 2 puffs into the lungs 2 (two) times daily.  . furosemide (LASIX) 20 MG  tablet Take 1 tablet (20 mg total) by mouth daily.  Marland Kitchen guaiFENesin-dextromethorphan (ROBITUSSIN DM) 100-10 MG/5ML syrup Take 5 mLs by mouth every 8 (eight) hours as needed for cough.  . metoprolol succinate (TOPROL-XL) 50 MG 24 hr tablet TAKE 1 TABLET DAILY WITH OR IMMEDIATELY FOLLOWING A MEAL.  . mirtazapine (REMERON) 15 MG tablet Take 1 tablet (15 mg total) by mouth at bedtime.  . Misc. Devices [facility]) MISC Use walker whenever walking, standard walker  . montelukast (SINGULAIR) 10 MG tablet Take 1 tablet (10 mg total) by mouth at bedtime.  . pravastatin (PRAVACHOL) 40 MG tablet Take 1 tablet (40 mg total) by mouth daily.  . traZODone (DESYREL) 50 MG tablet Take 1-2 tablets (50-100 mg total) by mouth at bedtime.  . triamcinolone (KENALOG) 0.025 % ointment Apply 1 application topically 2 (two) times daily.  Marland Kitchen triamcinolone ointment (KENALOG) 0.1 % SMARTSIG:1 Application Topical 2-3 Times Daily   No facility-administered encounter medications on file as of 03/02/2020.    PHYSICAL EXAM:   General: NAD, frail and fragile appearing elderly male of thin EENT:  Frequent teariing  Cardiovascular: regular rate and irregular rhythm Pulmonary: clear all lung fields Abdomen: soft, nontender, + bowel sounds Extremities: no edema, decreased muscle mass Skin: multiple ecchymotic lesions of both upper ect Neurological: Alert and oriented x 3.  Generalized weakness   Gonzella Lex, NP-C

## 2020-03-03 ENCOUNTER — Telehealth: Payer: Self-pay

## 2020-03-03 DIAGNOSIS — J449 Chronic obstructive pulmonary disease, unspecified: Secondary | ICD-10-CM | POA: Diagnosis not present

## 2020-03-03 DIAGNOSIS — R4182 Altered mental status, unspecified: Secondary | ICD-10-CM | POA: Diagnosis not present

## 2020-03-03 NOTE — Telephone Encounter (Signed)
-----   Message from Cameron Sprang, MD sent at 03/03/2020  2:31 PM EDT ----- Pls let daughter know the MRI brain did not show any evidence of tumor, stroke, bleed, or hydrocephalus. It showed age-related changes. The cervical MRI did not show any spinal cord compression. When the neck was imaged, the upper part of his lungs were seen concerning for changes that need further testing, proceed with chest xray scheduled by his PCP, when is he scheduled for it? Thanks

## 2020-03-03 NOTE — Telephone Encounter (Signed)
Pt daughter called no answer voice mail left for pt to call back

## 2020-03-06 DIAGNOSIS — R4182 Altered mental status, unspecified: Secondary | ICD-10-CM | POA: Diagnosis not present

## 2020-03-08 ENCOUNTER — Telehealth: Payer: Self-pay

## 2020-03-08 NOTE — Telephone Encounter (Signed)
-----   Message from Cameron Sprang, MD sent at 03/03/2020  2:31 PM EDT ----- Pls let daughter know the MRI brain did not show any evidence of tumor, stroke, bleed, or hydrocephalus. It showed age-related changes. The cervical MRI did not show any spinal cord compression. When the neck was imaged, the upper part of his lungs were seen concerning for changes that need further testing, proceed with chest xray scheduled by his PCP, when is he scheduled for it? Thanks

## 2020-03-08 NOTE — Telephone Encounter (Signed)
Pt  Daughter called no answer voice mail left for her to call the office back to go over MRI results

## 2020-03-10 ENCOUNTER — Telehealth: Payer: Self-pay

## 2020-03-10 ENCOUNTER — Other Ambulatory Visit: Payer: Self-pay

## 2020-03-10 ENCOUNTER — Ambulatory Visit (INDEPENDENT_AMBULATORY_CARE_PROVIDER_SITE_OTHER): Payer: Medicare HMO

## 2020-03-10 ENCOUNTER — Inpatient Hospital Stay (HOSPITAL_COMMUNITY)
Admission: EM | Admit: 2020-03-10 | Discharge: 2020-03-13 | DRG: 193 | Disposition: A | Discharge status: Home / Self Care | Payer: Medicare HMO | Source: Ambulatory Visit | Attending: Internal Medicine | Admitting: Internal Medicine

## 2020-03-10 ENCOUNTER — Encounter (HOSPITAL_COMMUNITY): Payer: Self-pay | Admitting: Emergency Medicine

## 2020-03-10 DIAGNOSIS — Z85828 Personal history of other malignant neoplasm of skin: Secondary | ICD-10-CM

## 2020-03-10 DIAGNOSIS — J4520 Mild intermittent asthma, uncomplicated: Secondary | ICD-10-CM

## 2020-03-10 DIAGNOSIS — K449 Diaphragmatic hernia without obstruction or gangrene: Secondary | ICD-10-CM

## 2020-03-10 DIAGNOSIS — J189 Pneumonia, unspecified organism: Secondary | ICD-10-CM | POA: Diagnosis not present

## 2020-03-10 DIAGNOSIS — R630 Anorexia: Secondary | ICD-10-CM | POA: Diagnosis not present

## 2020-03-10 DIAGNOSIS — Z9889 Other specified postprocedural states: Secondary | ICD-10-CM

## 2020-03-10 DIAGNOSIS — L309 Dermatitis, unspecified: Secondary | ICD-10-CM | POA: Diagnosis present

## 2020-03-10 DIAGNOSIS — J986 Disorders of diaphragm: Secondary | ICD-10-CM

## 2020-03-10 DIAGNOSIS — J9611 Chronic respiratory failure with hypoxia: Secondary | ICD-10-CM | POA: Diagnosis present

## 2020-03-10 DIAGNOSIS — R578 Other shock: Secondary | ICD-10-CM | POA: Diagnosis present

## 2020-03-10 DIAGNOSIS — E860 Dehydration: Secondary | ICD-10-CM | POA: Diagnosis present

## 2020-03-10 DIAGNOSIS — E782 Mixed hyperlipidemia: Secondary | ICD-10-CM | POA: Diagnosis present

## 2020-03-10 DIAGNOSIS — Z7902 Long term (current) use of antithrombotics/antiplatelets: Secondary | ICD-10-CM

## 2020-03-10 DIAGNOSIS — R0989 Other specified symptoms and signs involving the circulatory and respiratory systems: Secondary | ICD-10-CM | POA: Diagnosis not present

## 2020-03-10 DIAGNOSIS — J4521 Mild intermittent asthma with (acute) exacerbation: Secondary | ICD-10-CM | POA: Diagnosis present

## 2020-03-10 DIAGNOSIS — R1310 Dysphagia, unspecified: Secondary | ICD-10-CM | POA: Diagnosis present

## 2020-03-10 DIAGNOSIS — Z7951 Long term (current) use of inhaled steroids: Secondary | ICD-10-CM

## 2020-03-10 DIAGNOSIS — Z8673 Personal history of transient ischemic attack (TIA), and cerebral infarction without residual deficits: Secondary | ICD-10-CM

## 2020-03-10 DIAGNOSIS — Z8249 Family history of ischemic heart disease and other diseases of the circulatory system: Secondary | ICD-10-CM

## 2020-03-10 DIAGNOSIS — Z7983 Long term (current) use of bisphosphonates: Secondary | ICD-10-CM

## 2020-03-10 DIAGNOSIS — Z66 Do not resuscitate: Secondary | ICD-10-CM | POA: Diagnosis present

## 2020-03-10 DIAGNOSIS — R0602 Shortness of breath: Secondary | ICD-10-CM | POA: Diagnosis not present

## 2020-03-10 DIAGNOSIS — J159 Unspecified bacterial pneumonia: Secondary | ICD-10-CM | POA: Diagnosis not present

## 2020-03-10 DIAGNOSIS — Z8619 Personal history of other infectious and parasitic diseases: Secondary | ICD-10-CM

## 2020-03-10 DIAGNOSIS — Z79899 Other long term (current) drug therapy: Secondary | ICD-10-CM

## 2020-03-10 DIAGNOSIS — I110 Hypertensive heart disease with heart failure: Secondary | ICD-10-CM | POA: Diagnosis present

## 2020-03-10 DIAGNOSIS — Z20822 Contact with and (suspected) exposure to covid-19: Secondary | ICD-10-CM | POA: Diagnosis present

## 2020-03-10 DIAGNOSIS — I10 Essential (primary) hypertension: Secondary | ICD-10-CM | POA: Diagnosis present

## 2020-03-10 DIAGNOSIS — I5032 Chronic diastolic (congestive) heart failure: Secondary | ICD-10-CM | POA: Diagnosis present

## 2020-03-10 DIAGNOSIS — J918 Pleural effusion in other conditions classified elsewhere: Secondary | ICD-10-CM | POA: Diagnosis present

## 2020-03-10 DIAGNOSIS — M810 Age-related osteoporosis without current pathological fracture: Secondary | ICD-10-CM | POA: Diagnosis present

## 2020-03-10 DIAGNOSIS — R54 Age-related physical debility: Secondary | ICD-10-CM | POA: Diagnosis present

## 2020-03-10 DIAGNOSIS — J90 Pleural effusion, not elsewhere classified: Secondary | ICD-10-CM | POA: Diagnosis present

## 2020-03-10 LAB — COMPREHENSIVE METABOLIC PANEL
ALT: 10 U/L (ref 0–44)
AST: 18 U/L (ref 15–41)
Albumin: 3.1 g/dL — ABNORMAL LOW (ref 3.5–5.0)
Alkaline Phosphatase: 96 U/L (ref 38–126)
Anion gap: 13 (ref 5–15)
BUN: 11 mg/dL (ref 8–23)
CO2: 30 mmol/L (ref 22–32)
Calcium: 9 mg/dL (ref 8.9–10.3)
Chloride: 93 mmol/L — ABNORMAL LOW (ref 98–111)
Creatinine, Ser: 0.98 mg/dL (ref 0.61–1.24)
GFR calc Af Amer: >60 mL/min (ref >60)
GFR calc non Af Amer: >60 mL/min (ref >60)
Glucose, Bld: 127 mg/dL — ABNORMAL HIGH (ref 70–99)
Potassium: 4.1 mmol/L (ref 3.5–5.1)
Sodium: 136 mmol/L (ref 135–145)
Total Bilirubin: 0.7 mg/dL (ref 0.3–1.2)
Total Protein: 7.6 g/dL (ref 6.5–8.1)

## 2020-03-10 LAB — POCT I-STAT EG7
Acid-Base Excess: 9 mmol/L — ABNORMAL HIGH (ref 0.0–2.0)
Bicarbonate: 34 mmol/L — ABNORMAL HIGH (ref 20.0–28.0)
Calcium, Ion: 1.08 mmol/L — ABNORMAL LOW (ref 1.15–1.40)
HCT: 44 % (ref 39.0–52.0)
Hemoglobin: 15 g/dL (ref 13.0–17.0)
O2 Saturation: 79 %
Potassium: 4 mmol/L (ref 3.5–5.1)
Sodium: 138 mmol/L (ref 135–145)
TCO2: 35 mmol/L — ABNORMAL HIGH (ref 22–32)
pCO2, Ven: 47.1 mmHg (ref 44.0–60.0)
pH, Ven: 7.467 — ABNORMAL HIGH (ref 7.250–7.430)
pO2, Ven: 41 mmHg (ref 32.0–45.0)

## 2020-03-10 LAB — CBC WITH DIFFERENTIAL/PLATELET
Abs Immature Granulocytes: 0.03 10*3/uL (ref 0.00–0.07)
Basophils Absolute: 0.1 10*3/uL (ref 0.0–0.1)
Basophils Relative: 1 %
Eosinophils Absolute: 0.3 10*3/uL (ref 0.0–0.5)
Eosinophils Relative: 4 %
HCT: 44.2 % (ref 39.0–52.0)
Hemoglobin: 14.3 g/dL (ref 13.0–17.0)
Immature Granulocytes: 0 %
Lymphocytes Relative: 13 %
Lymphs Abs: 1.1 10*3/uL (ref 0.7–4.0)
MCH: 31.7 pg (ref 26.0–34.0)
MCHC: 32.4 g/dL (ref 30.0–36.0)
MCV: 98 fL (ref 80.0–100.0)
Monocytes Absolute: 0.6 10*3/uL (ref 0.1–1.0)
Monocytes Relative: 8 %
Neutro Abs: 6.2 10*3/uL (ref 1.7–7.7)
Neutrophils Relative %: 74 %
Platelets: 246 10*3/uL (ref 150–400)
RBC: 4.51 MIL/uL (ref 4.22–5.81)
RDW: 12 % (ref 11.5–15.5)
WBC: 8.3 10*3/uL (ref 4.0–10.5)
nRBC: 0 % (ref 0.0–0.2)

## 2020-03-10 LAB — LACTIC ACID, PLASMA
Lactic Acid, Venous: 2.6 mmol/L (ref 0.5–1.9)
Lactic Acid, Venous: 2.5 mmol/L (ref 0.5–1.9)

## 2020-03-10 LAB — BRAIN NATRIURETIC PEPTIDE: B Natriuretic Peptide: 175.5 pg/mL — ABNORMAL HIGH (ref 0.0–100.0)

## 2020-03-10 LAB — PROTIME-INR
INR: 1.1 (ref 0.8–1.2)
Prothrombin Time: 13.3 seconds (ref 11.4–15.2)

## 2020-03-10 LAB — POC SARS CORONAVIRUS 2 AG -  ED: SARS Coronavirus 2 Ag: NEGATIVE

## 2020-03-10 MED ORDER — SODIUM CHLORIDE 0.9 % IV SOLN
1.0000 g | Freq: Once | INTRAVENOUS | Status: AC
  Administered 2020-03-10: 1 g via INTRAVENOUS
  Filled 2020-03-10: qty 10

## 2020-03-10 MED ORDER — DOXYCYCLINE HYCLATE 100 MG PO TABS
100.0000 mg | ORAL_TABLET | Freq: Once | ORAL | Status: AC
  Administered 2020-03-10: 100 mg via ORAL
  Filled 2020-03-10: qty 1

## 2020-03-10 NOTE — ED Triage Notes (Signed)
Patient sent over after having Chest x-ray showing moderate right pleural effusion. With mild left infiltrate. Patient reports cough with yellow phlegm going on for past few weeks. Denies any pain.

## 2020-03-10 NOTE — Telephone Encounter (Signed)
Spoke with patient's sister [name] , informing her per drs chest X ray results and recommendations. She states will have caregiver take patient to [facility] ER facility for treatment, due to her being out of town currently.

## 2020-03-10 NOTE — ED Provider Notes (Signed)
[place] EMERGENCY DEPARTMENT Provider Note   CSN: [id number] Arrival date & time: 03/10/20  1751     History Chief Complaint  Patient presents with  . Cough    Abnornal X-ray    [name] is a 86 y.o. male.  The history is provided by the patient, a caregiver and medical records. No language interpreter was used.  Cough  86 year old male with history of hypertension, hyperlipidemia, CHF, prior stroke, asthma presenting complaining of persistent cough.  History obtained through patient and through caregiver who is at bedside.  For the past 2 to 3 weeks patient has had persistent cough with yellow sputum.  He is also having some trouble swallowing on his own saliva sometimes.  He has decreased appetite.  He is generally weaker than usual.  No report of fever or chills no loss of taste or smell no urinary symptom and he denies worsening shortness of breath.  He has been evaluated by his primary care doctor in the past and was given antibiotic that he took for 1 week without any improvement.  Was also given cough medication with minimal relief.  He recently was reevaluated by his PCP and a chest x-ray was obtained today showing moderate right pleural effusion prompting this ER visit.  He has had his Covid vaccination several months ago.  He denies any active pain specifically no chest pain.  Patient wears oxygen at night time only.  During the day his O2 sats usually above 90s.    Past Medical History:  Diagnosis Date  . Allergy    seasonal  fall  . Asthma    as child  . Cancer ([place])    Basal cell carcinoma; multiple  . Dyspnea   . Eczema   . Hyperlipidemia   . Hypertension   . MSSA bacteremia 2020  . Osteoporosis   . Pleural effusion due to CHF (congestive heart failure) ([place])   . Stroke [facility])     Patient Active Problem List   Diagnosis Date Noted  . MSSA bacteremia 07/15/2019  . Hypoxia   . Peripheral edema   . Thyroid nodule   . Acute respiratory  failure with hypoxia ([place]) 07/14/2019  . HCAP (healthcare-associated pneumonia) 07/14/2019  . Thyroid mass 07/14/2019  . Diaphragm paralysis 05/04/2019  . Restrictive lung disease 05/04/2019  . Altered mental status 04/30/2019  . Confusion 04/30/2019  . Abnormal weight loss 03/23/2019  . Pain in joint of left shoulder 03/23/2019  . Frequent urination 03/23/2019  . Constipation 03/23/2019  . TIA (transient ischemic attack) 03/08/2019  . Hyponatremia 03/08/2019  . Impaired mobility 01/13/2019  . Seasonal allergies 01/13/2019  . Hiatal hernia 01/13/2019  . Pain in right hip 12/24/2018  . S/P right hip fracture 11/11/2018  . Recurrent falls 05/12/2018  . History of CVA (cerebrovascular accident) 01/16/2018  . Nocturnal enuresis   . Fall   . Full incontinence of feces   . Essential hypertension   . Hypoalbuminemia due to protein-calorie malnutrition ([place])   . Abnormality of gait   . Leg edema   . Hip fracture ([place]) 03/30/2017  . Leukocytosis 03/29/2017  . Mild intermittent asthma with acute exacerbation 01/29/2017  . Chronic renal insufficiency 09/22/2015  . Tachycardia 09/22/2015  . PVC (premature ventricular contraction) 09/22/2015  . Osteoporosis 06/03/2014  . Basal cell carcinoma of face 06/03/2014  . Benign prostatic hyperplasia 05/27/2013  . Eczema 04/23/2012  . Dyslipidemia 04/23/2012    Past Surgical History:  Procedure Laterality Date  .  CATARACT EXTRACTION, BILATERAL  06/29/2015  . INTRAMEDULLARY (IM) NAIL INTERTROCHANTERIC Left 03/30/2017   Procedure: INTRAMEDULLARY (IM) NAIL INTERTROCHANTRIC;  Surgeon: [name], MD;  Location: [place];  Service: Orthopedics;  Laterality: Left;  . INTRAMEDULLARY (IM) NAIL INTERTROCHANTERIC Right 11/12/2018   Procedure: INTRAMEDULLARY (IM) NAIL RIGHT INTERTROCHANTERIC HIP FRACTURE;  Surgeon: [name], MD;  Location: [place];  Service: Orthopedics;  Laterality: Right;  . MOHS SURGERY    . TEE WITHOUT CARDIOVERSION N/A  07/20/2019   Procedure: TRANSESOPHAGEAL ECHOCARDIOGRAM (TEE);  Surgeon: [name], MD;  Location: [facility] ENDOSCOPY;  Service: Cardiovascular;  Laterality: N/A;  . TONSILLECTOMY  1940 maybe       Family History  Problem Relation Age of Onset  . Arthritis Mother   . Heart disease Mother 68  . Hypertension Father     Social History   Tobacco Use  . Smoking status: Never Smoker  . Smokeless tobacco: Never Used  Substance Use Topics  . Alcohol use: No    Comment: occas  . Drug use: No    Home Medications Prior to Admission medications   Medication Sig Start Date End Date Taking? Authorizing Provider  acetaminophen (TYLENOL) 500 MG tablet Take 500-1,000 mg by mouth as needed for mild pain or headache (pain from eczema).     [provider]  albuterol (PROAIR HFA) 108 (90 Base) MCG/ACT inhaler Inhale 1-2 puffs into the lungs every 6 (six) hours as needed for wheezing or shortness of breath. 11/19/19   Rutherford Guys, MD  alendronate (FOSAMAX) 70 MG tablet TAKE 1 TABLET EVERY 7 DAYS. TAKE WITH A FULL GLASS OF WATER ON AN EMPTY STOMACH. Patient taking differently: Take 70 mg by mouth once a week.  01/31/20   Rutherford Guys, MD  azithromycin (ZITHROMAX) 250 MG tablet Take 2 on first day, then take 1 daily. Finish entire supply. Patient taking differently: Take 250 mg by mouth See admin instructions. Take 2 on first day, then take 1 daily. Finish entire supply. 02/15/20   Maximiano Coss, NP  beclomethasone (QVAR) 80 MCG/ACT inhaler Inhale 2 puffs into the lungs 2 (two) times daily. 02/15/20   Maximiano Coss, NP  Blood Pressure KIT Please check BP twice a day Patient taking differently: 1 each by Other route in the morning and at bedtime.  03/25/19   Rutherford Guys, MD  clopidogrel (PLAVIX) 75 MG tablet TAKE 1 TABLET ONCE DAILY. Patient taking differently: Take 75 mg by mouth daily.  01/31/20   Rutherford Guys, MD  docusate sodium (COLACE) 100 MG capsule Take 1 capsule (100 mg  total) by mouth daily as needed for mild constipation. 07/21/19   Hongalgi, Lenis Dickinson, MD  fluticasone (FLOVENT HFA) 110 MCG/ACT inhaler Inhale 2 puffs into the lungs 2 (two) times daily. 02/24/20   Rutherford Guys, MD  furosemide (LASIX) 20 MG tablet Take 1 tablet (20 mg total) by mouth daily. 11/19/19   Rutherford Guys, MD  guaiFENesin-dextromethorphan (ROBITUSSIN DM) 100-10 MG/5ML syrup Take 5 mLs by mouth every 8 (eight) hours as needed for cough. 02/15/20   Maximiano Coss, NP  metoprolol succinate (TOPROL-XL) 50 MG 24 hr tablet TAKE 1 TABLET DAILY WITH OR IMMEDIATELY FOLLOWING A MEAL. Patient taking differently: Take 50 mg by mouth daily after breakfast.  11/19/19   Rutherford Guys, MD  mirtazapine (REMERON) 15 MG tablet Take 1 tablet (15 mg total) by mouth at bedtime. 02/24/20   Rutherford Guys, MD  Misc. Devices (  WALKER) MISC Use walker whenever walking, standard walker Patient taking differently: 1 each by Other route See admin instructions. Use walker whenever walking, standard walker 07/27/18   Rutherford Guys, MD  montelukast (SINGULAIR) 10 MG tablet Take 1 tablet (10 mg total) by mouth at bedtime. 02/15/20   Maximiano Coss, NP  pravastatin (PRAVACHOL) 40 MG tablet Take 1 tablet (40 mg total) by mouth daily. 11/19/19   Rutherford Guys, MD  traZODone (DESYREL) 50 MG tablet Take 1-2 tablets (50-100 mg total) by mouth at bedtime. 11/19/19   Rutherford Guys, MD  triamcinolone (KENALOG) 0.025 % ointment Apply 1 application topically 2 (two) times daily. 11/19/19   Rutherford Guys, MD  triamcinolone ointment (KENALOG) 0.1 % Apply 1 application topically 3 (three) times daily.  01/03/20   [provider]    Allergies    Other  Review of Systems   Review of Systems  Respiratory: Positive for cough.   All other systems reviewed and are negative.   Physical Exam Updated Vital Signs BP 94/77 (BP Location: Left Arm)   Pulse 83   Temp 98.2 F (36.8 C) (Oral)   Resp 18   Ht 5' 6.5"  (1.689 m)   Wt 63.5 kg   SpO2 92%   BMI 22.26 kg/m   Physical Exam Vitals and nursing note reviewed.  Constitutional:      General: He is not in acute distress.    Appearance: He is well-developed.     Comments: Frail elderly male in no acute discomfort.  HENT:     Head: Atraumatic.  Eyes:     Conjunctiva/sclera: Conjunctivae normal.     Comments: Significant ectropion noted to both eyelids  Cardiovascular:     Rate and Rhythm: Normal rate and regular rhythm.     Pulses: Normal pulses.     Heart sounds: Normal heart sounds.  Pulmonary:     Effort: Pulmonary effort is normal.     Breath sounds: Rales (Crackles heard at lung bases most significant in the right side) present.  Abdominal:     Palpations: Abdomen is soft.     Tenderness: There is no abdominal tenderness.  Musculoskeletal:     Cervical back: Neck supple.     Right lower leg: No edema.     Left lower leg: No edema.  Skin:    Findings: No rash.  Neurological:     Mental Status: He is alert. Mental status is at baseline.  Psychiatric:        Mood and Affect: Mood normal.     ED Results / Procedures / Treatments   Labs (all labs ordered are listed, but only abnormal results are displayed) Labs Reviewed  COMPREHENSIVE METABOLIC PANEL - Abnormal; Notable for the following components:      Result Value   Chloride 93 (*)    Glucose, Bld 127 (*)    Albumin 3.1 (*)    All other components within normal limits  LACTIC ACID, PLASMA - Abnormal; Notable for the following components:   Lactic Acid, Venous 2.6 (*)    All other components within normal limits  LACTIC ACID, PLASMA - Abnormal; Notable for the following components:   Lactic Acid, Venous 2.5 (*)    All other components within normal limits  BRAIN NATRIURETIC PEPTIDE - Abnormal; Notable for the following components:   B Natriuretic Peptide 175.5 (*)    All other components within normal limits  POCT I-STAT EG7 - Abnormal; Notable for the  following  components:   pH, Ven 7.467 (*)    Bicarbonate 34.0 (*)    TCO2 35 (*)    Acid-Base Excess 9.0 (*)    Calcium, Ion 1.08 (*)    All other components within normal limits  CULTURE, BLOOD (ROUTINE X 2)  CULTURE, BLOOD (ROUTINE X 2)  CBC WITH DIFFERENTIAL/PLATELET  PROTIME-INR  URINALYSIS, ROUTINE W REFLEX MICROSCOPIC  BLOOD GAS, VENOUS  POC SARS CORONAVIRUS 2 AG -  ED    EKG EKG Interpretation  Date/Time:  Friday Mar 10 2020 18:09:06 EDT Ventricular Rate:  99 PR Interval:  138 QRS Duration: 90 QT Interval:  352 QTC Calculation: 451 R Axis:   73 Text Interpretation: Sinus rhythm with frequent and consecutive Premature ventricular complexes Nonspecific ST and T wave abnormality Abnormal ECG Confirmed by Davonna Belling [phone]) on 03/10/2020 10:21:13 PM   Radiology DG Chest 2 View  Result Date: 03/10/2020 CLINICAL DATA:  Shortness of breath. EXAM: CHEST - 2 VIEW COMPARISON:  July 30, 2019. FINDINGS: Stable cardiomediastinal silhouette. Moderate right pleural effusion is noted with probable underlying atelectasis or infiltrate. Mild left basilar atelectasis or infiltrate is noted. No pneumothorax is noted. Bony thorax is unremarkable. IMPRESSION: Moderate right pleural effusion is noted with probable underlying atelectasis or infiltrate. Mild left basilar atelectasis or infiltrate is noted. Electronically Signed   By: Marijo Conception M.D.   On: 03/10/2020 17:02    Procedures Procedures (including critical care time)  Medications Ordered in ED Medications  cefTRIAXone (ROCEPHIN) 1 g in sodium chloride 0.9 % 100 mL IVPB (has no administration in time range)  doxycycline (VIBRA-TABS) tablet 100 mg (has no administration in time range)    ED Course  I have reviewed the triage vital signs and the nursing notes.  Pertinent labs & imaging results that were available during my care of the patient were reviewed by me and considered in my medical decision making (see chart for  details).    MDM Rules/Calculators/A&P                      BP (!) 126/48   Pulse 90   Temp 98.2 F (36.8 C) (Oral)   Resp (!) 24   Ht 5' 6.5" (1.689 m)   Wt 63.5 kg   SpO2 90%   BMI 22.26 kg/m   Final Clinical Impression(s) / ED Diagnoses Final diagnoses:  Community acquired pneumonia, unspecified laterality  Pleural effusion, right    Rx / DC Orders ED Discharge Orders    None     10:20 PM Patient here with persistent cough, having trouble swallowing symptoms, and recently had a x-ray of his chest shows moderate right pleural effusion as well as atelectasis versus infiltrate.  He is afebrile.  Normal WBC.  Elevated lactic acid of 2.4 possibly due to dehydration and less likely to be from sepsis.  Does have history of skin cancer.  Unsure if he has a potential underlying malignancy causing pleural effusion.  Last echo in 2020 showing an EF of 55 to 60%.  Plan to admit patient for further work-up of his pleural effusion.  I did discuss care with patient's daughter over the phone who is also the power of attorney.  The patient is noted to have a lactate >=2. With the current information available to me, I do not think the elevated lactate is directly related to Sepsis. Elevated lactate is due to OTHER SHOCK dehydration.  Covid test is negative.  11:37  PM Since patient has recurrent cough, some trouble swallowing, at nighttime he requires supplemental oxygen, felt empiric antibiotic and admission would be reasonable.  Will initiate Rocephin and doxycycline.  Appreciate consultation from [facility] hospitalist who agrees to see and admit patient for further management.  Zacchaeus Halm was evaluated in Emergency Department on 03/10/2020 for the symptoms described in the history of present illness. He was evaluated in the context of the global COVID-19 pandemic, which necessitated consideration that the patient might be at risk for infection with the SARS-CoV-2 virus that causes COVID-19.  Institutional protocols and algorithms that pertain to the evaluation of patients at risk for COVID-19 are in a state of rapid change based on information released by regulatory bodies including the CDC and federal and state organizations. These policies and algorithms were followed during the patient's care in the ED.    Domenic Moras, PA-C 03/10/20 1017    Davonna Belling, MD 03/10/20 [phone]

## 2020-03-10 NOTE — ED Notes (Signed)
Unable to acquire second tube for cultures.

## 2020-03-11 ENCOUNTER — Observation Stay (HOSPITAL_COMMUNITY): Payer: Medicare HMO

## 2020-03-11 ENCOUNTER — Encounter (HOSPITAL_COMMUNITY): Payer: Self-pay | Admitting: Internal Medicine

## 2020-03-11 DIAGNOSIS — R590 Localized enlarged lymph nodes: Secondary | ICD-10-CM | POA: Diagnosis not present

## 2020-03-11 DIAGNOSIS — I5032 Chronic diastolic (congestive) heart failure: Secondary | ICD-10-CM

## 2020-03-11 DIAGNOSIS — E860 Dehydration: Secondary | ICD-10-CM | POA: Diagnosis present

## 2020-03-11 DIAGNOSIS — Z7951 Long term (current) use of inhaled steroids: Secondary | ICD-10-CM | POA: Diagnosis not present

## 2020-03-11 DIAGNOSIS — Z20822 Contact with and (suspected) exposure to covid-19: Secondary | ICD-10-CM | POA: Diagnosis not present

## 2020-03-11 DIAGNOSIS — R54 Age-related physical debility: Secondary | ICD-10-CM | POA: Diagnosis not present

## 2020-03-11 DIAGNOSIS — J189 Pneumonia, unspecified organism: Secondary | ICD-10-CM | POA: Diagnosis present

## 2020-03-11 DIAGNOSIS — J9611 Chronic respiratory failure with hypoxia: Secondary | ICD-10-CM | POA: Diagnosis not present

## 2020-03-11 DIAGNOSIS — Z7983 Long term (current) use of bisphosphonates: Secondary | ICD-10-CM | POA: Diagnosis not present

## 2020-03-11 DIAGNOSIS — J948 Other specified pleural conditions: Secondary | ICD-10-CM | POA: Diagnosis not present

## 2020-03-11 DIAGNOSIS — J90 Pleural effusion, not elsewhere classified: Secondary | ICD-10-CM | POA: Diagnosis not present

## 2020-03-11 DIAGNOSIS — Z66 Do not resuscitate: Secondary | ICD-10-CM | POA: Diagnosis not present

## 2020-03-11 DIAGNOSIS — R0602 Shortness of breath: Secondary | ICD-10-CM | POA: Diagnosis not present

## 2020-03-11 DIAGNOSIS — J918 Pleural effusion in other conditions classified elsewhere: Secondary | ICD-10-CM | POA: Diagnosis not present

## 2020-03-11 DIAGNOSIS — Z79899 Other long term (current) drug therapy: Secondary | ICD-10-CM | POA: Diagnosis not present

## 2020-03-11 DIAGNOSIS — E782 Mixed hyperlipidemia: Secondary | ICD-10-CM | POA: Diagnosis not present

## 2020-03-11 DIAGNOSIS — Z8673 Personal history of transient ischemic attack (TIA), and cerebral infarction without residual deficits: Secondary | ICD-10-CM | POA: Diagnosis not present

## 2020-03-11 DIAGNOSIS — Z8619 Personal history of other infectious and parasitic diseases: Secondary | ICD-10-CM | POA: Diagnosis not present

## 2020-03-11 DIAGNOSIS — Z7902 Long term (current) use of antithrombotics/antiplatelets: Secondary | ICD-10-CM | POA: Diagnosis not present

## 2020-03-11 DIAGNOSIS — J159 Unspecified bacterial pneumonia: Secondary | ICD-10-CM | POA: Diagnosis not present

## 2020-03-11 DIAGNOSIS — R1310 Dysphagia, unspecified: Secondary | ICD-10-CM | POA: Diagnosis present

## 2020-03-11 DIAGNOSIS — R578 Other shock: Secondary | ICD-10-CM | POA: Diagnosis not present

## 2020-03-11 DIAGNOSIS — I110 Hypertensive heart disease with heart failure: Secondary | ICD-10-CM | POA: Diagnosis present

## 2020-03-11 DIAGNOSIS — M810 Age-related osteoporosis without current pathological fracture: Secondary | ICD-10-CM | POA: Diagnosis not present

## 2020-03-11 DIAGNOSIS — J9811 Atelectasis: Secondary | ICD-10-CM | POA: Diagnosis not present

## 2020-03-11 DIAGNOSIS — Z8249 Family history of ischemic heart disease and other diseases of the circulatory system: Secondary | ICD-10-CM | POA: Diagnosis not present

## 2020-03-11 DIAGNOSIS — J4520 Mild intermittent asthma, uncomplicated: Secondary | ICD-10-CM | POA: Diagnosis not present

## 2020-03-11 DIAGNOSIS — Z85828 Personal history of other malignant neoplasm of skin: Secondary | ICD-10-CM | POA: Diagnosis not present

## 2020-03-11 DIAGNOSIS — L309 Dermatitis, unspecified: Secondary | ICD-10-CM | POA: Diagnosis present

## 2020-03-11 DIAGNOSIS — I10 Essential (primary) hypertension: Secondary | ICD-10-CM

## 2020-03-11 DIAGNOSIS — J4521 Mild intermittent asthma with (acute) exacerbation: Secondary | ICD-10-CM | POA: Diagnosis not present

## 2020-03-11 HISTORY — DX: Chronic diastolic (congestive) heart failure: I50.32

## 2020-03-11 LAB — URINALYSIS, ROUTINE W REFLEX MICROSCOPIC
Bilirubin Urine: NEGATIVE
Glucose, UA: NEGATIVE mg/dL
Hgb urine dipstick: NEGATIVE
Ketones, ur: NEGATIVE mg/dL
Leukocytes,Ua: NEGATIVE
Nitrite: NEGATIVE
Protein, ur: NEGATIVE mg/dL
Specific Gravity, Urine: 1.008 (ref 1.005–1.030)
pH: 6 (ref 5.0–8.0)

## 2020-03-11 LAB — COMPREHENSIVE METABOLIC PANEL
ALT: 9 U/L (ref 0–44)
AST: 15 U/L (ref 15–41)
Albumin: 2.7 g/dL — ABNORMAL LOW (ref 3.5–5.0)
Alkaline Phosphatase: 83 U/L (ref 38–126)
Anion gap: 11 (ref 5–15)
BUN: 9 mg/dL (ref 8–23)
CO2: 34 mmol/L — ABNORMAL HIGH (ref 22–32)
Calcium: 8.7 mg/dL — ABNORMAL LOW (ref 8.9–10.3)
Chloride: 97 mmol/L — ABNORMAL LOW (ref 98–111)
Creatinine, Ser: 0.87 mg/dL (ref 0.61–1.24)
GFR calc Af Amer: >60 mL/min (ref >60)
GFR calc non Af Amer: >60 mL/min (ref >60)
Glucose, Bld: 101 mg/dL — ABNORMAL HIGH (ref 70–99)
Potassium: 3.7 mmol/L (ref 3.5–5.1)
Sodium: 142 mmol/L (ref 135–145)
Total Bilirubin: 0.6 mg/dL (ref 0.3–1.2)
Total Protein: 6.6 g/dL (ref 6.5–8.1)

## 2020-03-11 LAB — GRAM STAIN

## 2020-03-11 LAB — CBC WITH DIFFERENTIAL/PLATELET
Abs Immature Granulocytes: 0.03 10*3/uL (ref 0.00–0.07)
Basophils Absolute: 0.1 10*3/uL (ref 0.0–0.1)
Basophils Relative: 1 %
Eosinophils Absolute: 0.4 10*3/uL (ref 0.0–0.5)
Eosinophils Relative: 7 %
HCT: 41.4 % (ref 39.0–52.0)
Hemoglobin: 13.4 g/dL (ref 13.0–17.0)
Immature Granulocytes: 1 %
Lymphocytes Relative: 14 %
Lymphs Abs: 0.9 10*3/uL (ref 0.7–4.0)
MCH: 31.5 pg (ref 26.0–34.0)
MCHC: 32.4 g/dL (ref 30.0–36.0)
MCV: 97.4 fL (ref 80.0–100.0)
Monocytes Absolute: 0.6 10*3/uL (ref 0.1–1.0)
Monocytes Relative: 10 %
Neutro Abs: 4.3 10*3/uL (ref 1.7–7.7)
Neutrophils Relative %: 67 %
Platelets: 217 10*3/uL (ref 150–400)
RBC: 4.25 MIL/uL (ref 4.22–5.81)
RDW: 12.1 % (ref 11.5–15.5)
WBC: 6.3 10*3/uL (ref 4.0–10.5)
nRBC: 0 % (ref 0.0–0.2)

## 2020-03-11 LAB — GLUCOSE, PLEURAL OR PERITONEAL FLUID: Glucose, Fluid: 97 mg/dL

## 2020-03-11 LAB — ALBUMIN, PLEURAL OR PERITONEAL FLUID: Albumin, Fluid: 2.2 g/dL

## 2020-03-11 LAB — BODY FLUID CELL COUNT WITH DIFFERENTIAL
Eos, Fluid: 8 %
Lymphs, Fluid: 82 %
Monocyte-Macrophage-Serous Fluid: 9 % — ABNORMAL LOW (ref 50–90)
Neutrophil Count, Fluid: 1 % (ref 0–25)
Total Nucleated Cell Count, Fluid: 3186 cu mm — ABNORMAL HIGH (ref 0–1000)

## 2020-03-11 LAB — SARS CORONAVIRUS 2 BY RT PCR (HOSPITAL ORDER, PERFORMED IN ~~LOC~~ HOSPITAL LAB): SARS Coronavirus 2: NEGATIVE

## 2020-03-11 LAB — LACTATE DEHYDROGENASE, PLEURAL OR PERITONEAL FLUID: LD, Fluid: 122 U/L — ABNORMAL HIGH (ref 3–23)

## 2020-03-11 LAB — PROTEIN, PLEURAL OR PERITONEAL FLUID: Total protein, fluid: 5 g/dL

## 2020-03-11 LAB — C-REACTIVE PROTEIN: CRP: 1.8 mg/dL — ABNORMAL HIGH (ref <1.0)

## 2020-03-11 LAB — MAGNESIUM: Magnesium: 1.9 mg/dL (ref 1.7–2.4)

## 2020-03-11 MED ORDER — MONTELUKAST SODIUM 10 MG PO TABS
10.0000 mg | ORAL_TABLET | Freq: Every day | ORAL | Status: DC
  Administered 2020-03-11 – 2020-03-12 (×2): 10 mg via ORAL
  Filled 2020-03-11 (×3): qty 1

## 2020-03-11 MED ORDER — TRAZODONE HCL 50 MG PO TABS: 50.0000 mg | ORAL_TABLET | Freq: Every day | ORAL | Status: DC

## 2020-03-11 MED ORDER — POLYETHYLENE GLYCOL 3350 17 G PO PACK: 17.0000 g | PACK | Freq: Every day | ORAL | Status: DC | PRN

## 2020-03-11 MED ORDER — FUROSEMIDE 20 MG PO TABS
20.0000 mg | ORAL_TABLET | Freq: Every day | ORAL | Status: DC
  Administered 2020-03-11 – 2020-03-13 (×3): 20 mg via ORAL
  Filled 2020-03-11 (×3): qty 1

## 2020-03-11 MED ORDER — METOPROLOL SUCCINATE ER 50 MG PO TB24
50.0000 mg | ORAL_TABLET | Freq: Every day | ORAL | Status: DC
  Administered 2020-03-11 – 2020-03-13 (×3): 50 mg via ORAL
  Filled 2020-03-11: qty 1
  Filled 2020-03-11: qty 2
  Filled 2020-03-11: qty 1

## 2020-03-11 MED ORDER — FUROSEMIDE 10 MG/ML IJ SOLN
20.0000 mg | Freq: Once | INTRAMUSCULAR | Status: AC
  Administered 2020-03-11: 20 mg via INTRAVENOUS
  Filled 2020-03-11: qty 2

## 2020-03-11 MED ORDER — ONDANSETRON HCL 4 MG PO TABS: 4.0000 mg | ORAL_TABLET | Freq: Four times a day (QID) | ORAL | Status: DC | PRN

## 2020-03-11 MED ORDER — DOXYCYCLINE HYCLATE 100 MG PO TABS: 100.0000 mg | ORAL_TABLET | Freq: Once | ORAL | Status: DC

## 2020-03-11 MED ORDER — GUAIFENESIN-DM 100-10 MG/5ML PO SYRP: 5.0000 mL | ORAL_SOLUTION | Freq: Three times a day (TID) | ORAL | Status: DC | PRN

## 2020-03-11 MED ORDER — RESOURCE THICKENUP CLEAR PO POWD
ORAL | Status: DC | PRN
  Filled 2020-03-11: qty 125

## 2020-03-11 MED ORDER — BUDESONIDE 0.5 MG/2ML IN SUSP
0.5000 mg | Freq: Two times a day (BID) | RESPIRATORY_TRACT | Status: DC
  Administered 2020-03-11 (×2): 0.5 mg via RESPIRATORY_TRACT
  Filled 2020-03-11 (×6): qty 2

## 2020-03-11 MED ORDER — SODIUM CHLORIDE 0.9 % IV SOLN
1.0000 g | INTRAVENOUS | Status: DC
  Administered 2020-03-11 – 2020-03-13 (×2): 1 g via INTRAVENOUS
  Filled 2020-03-11 (×3): qty 10

## 2020-03-11 MED ORDER — ALBUTEROL SULFATE (2.5 MG/3ML) 0.083% IN NEBU: 2.5000 mg | INHALATION_SOLUTION | RESPIRATORY_TRACT | Status: DC | PRN

## 2020-03-11 MED ORDER — ACETAMINOPHEN 650 MG RE SUPP: 650.0000 mg | Freq: Four times a day (QID) | RECTAL | Status: DC | PRN

## 2020-03-11 MED ORDER — ONDANSETRON HCL 4 MG/2ML IJ SOLN: 4.0000 mg | Freq: Four times a day (QID) | INTRAMUSCULAR | Status: DC | PRN

## 2020-03-11 MED ORDER — SODIUM CHLORIDE 0.9 % IV SOLN: 100.0000 mg | Freq: Two times a day (BID) | INTRAVENOUS | Status: DC

## 2020-03-11 MED ORDER — LIDOCAINE HCL (PF) 1 % IJ SOLN
INTRAMUSCULAR | Status: AC
  Filled 2020-03-11: qty 30

## 2020-03-11 MED ORDER — ACETAMINOPHEN 325 MG PO TABS: 650.0000 mg | ORAL_TABLET | Freq: Four times a day (QID) | ORAL | Status: DC | PRN

## 2020-03-11 MED ORDER — MIRTAZAPINE 15 MG PO TABS
15.0000 mg | ORAL_TABLET | Freq: Every day | ORAL | Status: DC
  Administered 2020-03-11 – 2020-03-12 (×2): 15 mg via ORAL
  Filled 2020-03-11 (×3): qty 1

## 2020-03-11 MED ORDER — PRAVASTATIN SODIUM 40 MG PO TABS
40.0000 mg | ORAL_TABLET | Freq: Every day | ORAL | Status: DC
  Administered 2020-03-11 – 2020-03-13 (×3): 40 mg via ORAL
  Filled 2020-03-11 (×3): qty 1

## 2020-03-11 MED ORDER — DOXYCYCLINE HYCLATE 100 MG PO TABS
100.0000 mg | ORAL_TABLET | Freq: Two times a day (BID) | ORAL | Status: DC
  Administered 2020-03-11 – 2020-03-13 (×5): 100 mg via ORAL
  Filled 2020-03-11 (×5): qty 1

## 2020-03-11 NOTE — Procedures (Signed)
PROCEDURE SUMMARY:  Successful US guided right thoracentesis. Yielded 1.2 liters of amber fluid. Pt tolerated procedure well. No immediate complications.  Specimen was sent for labs. CXR ordered.  EBL < 5 mL  Docia Barrier PA-C 03/11/2020 9:44 AM

## 2020-03-11 NOTE — Progress Notes (Signed)
Patient seen and examined.  Still in the emergency room.  Going for thoracentesis.  Admitted by early morning hospitalist.  See H&P.  I also called and discussed case with his daughter, Ms. [name] and updated her about thoracentesis, awaiting results and antibiotic treatments.  Patient currently remains hemodynamically stable.  Plan: Going for thoracentesis, wait for results.  Preliminary results with transudate. Monitor today in the hospital, continue antibiotics. We will reevaluate tomorrow morning whether he can be treated with oral medications at home.

## 2020-03-11 NOTE — H&P (Signed)
History and Physical    [name] KCM:[id number] DOB: Jun 22, 1932 DOA: 03/10/2020  PCP: Rutherford Guys, MD  Patient coming from:   Chief Complaint:  Chief Complaint  Patient presents with  . Cough    Abnornal X-ray     HPI:  86 year-old male with past medical history of multiple episodes of basal cell carcinoma in the past, mixed hyperlipidemia, asthma, hypertension, diastolic congestive heart failure, MSSA bacteremia (hospitalized at [facility] 06/2019), eczema, previous stroke who presents to [facility] emergency department at the direction of his primary care provider.  Patient is an extremely poor historian and therefore the majority the history is been obtained from outpatient provider notes.  In mid April the patient began to develop increasing wheezing as well as cough productive of yellow sputum.  Patient was evaluated at his primary care office on 4/20 and at that time patient was placed on a course of azithromycin.  Patient then followed up at his primary care office again on 4/23 and was found to have persisting symptoms.  The symptoms included continued cough productive of yellow sputum as well as wheezing and progressively worsening shortness of breath..  Patient also began to develop difficulty with swallowing.  Patient was advised to continue taking the same antibiotic for several more days until the course was complete.  In the days that followed, patient was placed on supplemental oxygen by his home health palliative care team progressively worsening symptoms.  On 5/14, follow-up x-ray was performed revealing a moderate to large sized right-sided pleural effusion for which the patient's primary care provider advised patient to go to [facility] emergency department for evaluation.  Emergency department staff placed the patient on doxycycline and ceftriaxone for suspected inadequately treated community-acquired pneumonia with possible parapneumonic effusion.  The  hospitalist group was then called to assess the patient for admission the hospital.  Review of Systems: Unable to perform due to patient being a poor historian  Past Medical History:  Diagnosis Date  . Allergy    seasonal  fall  . Asthma    as child  . Cancer ([place])    Basal cell carcinoma; multiple  . Chronic diastolic CHF (congestive heart failure) ([place]) 03/11/2020  . Dyspnea   . Eczema   . Hyperlipidemia   . Hypertension   . MSSA bacteremia 2020  . Osteoporosis   . Pleural effusion due to CHF (congestive heart failure) ([place])   . Stroke [facility])     Past Surgical History:  Procedure Laterality Date  . CATARACT EXTRACTION, BILATERAL  06/29/2015  . INTRAMEDULLARY (IM) NAIL INTERTROCHANTERIC Left 03/30/2017   Procedure: INTRAMEDULLARY (IM) NAIL INTERTROCHANTRIC;  Surgeon: [name], MD;  Location: [place];  Service: Orthopedics;  Laterality: Left;  . INTRAMEDULLARY (IM) NAIL INTERTROCHANTERIC Right 11/12/2018   Procedure: INTRAMEDULLARY (IM) NAIL RIGHT INTERTROCHANTERIC HIP FRACTURE;  Surgeon: [name], MD;  Location: [place];  Service: Orthopedics;  Laterality: Right;  . MOHS SURGERY    . TEE WITHOUT CARDIOVERSION N/A 07/20/2019   Procedure: TRANSESOPHAGEAL ECHOCARDIOGRAM (TEE);  Surgeon: [name], MD;  Location: [facility] ENDOSCOPY;  Service: Cardiovascular;  Laterality: N/A;  . TONSILLECTOMY  1940 maybe     reports that he has never smoked. He has never used smokeless tobacco. He reports that he does not drink alcohol or use drugs.  Allergies  Allergen Reactions  . Other Shortness Of Breath, Itching and Other (See Comments)    Seasonal allergies- Runny nose, congestion, itchy eyes, and  wheezing    Family History  Problem Relation Age of Onset  . Arthritis Mother   . Heart disease Mother 28  . Hypertension Father      Prior to Admission medications   Medication Sig Start Date End Date Taking? Authorizing Provider  acetaminophen (TYLENOL) 500 MG tablet Take  500-1,000 mg by mouth as needed for mild pain or headache (pain from eczema).    Yes [provider]  albuterol (PROAIR HFA) 108 (90 Base) MCG/ACT inhaler Inhale 1-2 puffs into the lungs every 6 (six) hours as needed for wheezing or shortness of breath. 11/19/19  Yes Rutherford Guys, MD  alendronate (FOSAMAX) 70 MG tablet TAKE 1 TABLET EVERY 7 DAYS. TAKE WITH A FULL GLASS OF WATER ON AN EMPTY STOMACH. Patient taking differently: Take 70 mg by mouth once a week.  01/31/20  Yes Rutherford Guys, MD  Blood Pressure KIT Please check BP twice a day Patient taking differently: 1 each by Other route in the morning and at bedtime.  03/25/19  Yes Rutherford Guys, MD  fluticasone (FLOVENT HFA) 110 MCG/ACT inhaler Inhale 2 puffs into the lungs 2 (two) times daily. 02/24/20  Yes Rutherford Guys, MD  furosemide (LASIX) 20 MG tablet Take 1 tablet (20 mg total) by mouth daily. 11/19/19  Yes Rutherford Guys, MD  guaiFENesin-dextromethorphan [facility] DM) 100-10 MG/5ML syrup Take 5 mLs by mouth every 8 (eight) hours as needed for cough. 02/15/20  Yes Maximiano Coss, NP  metoprolol succinate (TOPROL-XL) 50 MG 24 hr tablet TAKE 1 TABLET DAILY WITH OR IMMEDIATELY FOLLOWING A MEAL. Patient taking differently: Take 50 mg by mouth daily after breakfast.  11/19/19  Yes Rutherford Guys, MD  mirtazapine (REMERON) 15 MG tablet Take 1 tablet (15 mg total) by mouth at bedtime. 02/24/20  Yes Rutherford Guys, MD  Misc. Devices (WALKER) MISC Use walker whenever walking, standard walker Patient taking differently: 1 each by Other route See admin instructions. Use walker whenever walking, standard walker 07/27/18  Yes Rutherford Guys, MD  montelukast (SINGULAIR) 10 MG tablet Take 1 tablet (10 mg total) by mouth at bedtime. 02/15/20  Yes Maximiano Coss, NP  pravastatin (PRAVACHOL) 40 MG tablet Take 1 tablet (40 mg total) by mouth daily. 11/19/19  Yes Rutherford Guys, MD  traZODone (DESYREL) 50 MG tablet Take 1-2 tablets  (50-100 mg total) by mouth at bedtime. 11/19/19  Yes Rutherford Guys, MD  triamcinolone (KENALOG) 0.025 % ointment Apply 1 application topically 2 (two) times daily. 11/19/19  Yes Rutherford Guys, MD  azithromycin (ZITHROMAX) 250 MG tablet Take 2 on first day, then take 1 daily. Finish entire supply. Patient not taking: Reported on 03/10/2020 02/15/20   Maximiano Coss, NP  beclomethasone (QVAR) 80 MCG/ACT inhaler Inhale 2 puffs into the lungs 2 (two) times daily. Patient not taking: Reported on 03/10/2020 02/15/20   Maximiano Coss, NP  clopidogrel (PLAVIX) 75 MG tablet TAKE 1 TABLET ONCE DAILY. Patient not taking: No sig reported 01/31/20   Rutherford Guys, MD  docusate sodium (COLACE) 100 MG capsule Take 1 capsule (100 mg total) by mouth daily as needed for mild constipation. Patient not taking: Reported on 03/10/2020 07/21/19   Modena Jansky, MD    Physical Exam: Vitals:   03/10/20 2330 03/11/20 0000 03/11/20 0045 03/11/20 0130  BP: 123/75 116/78 118/76 137/84  Pulse: 62 85 85 96  Resp: (!) 25 (!) 32 (!) 29 (!) 36  Temp:  TempSrc:      SpO2: 99% 99% 96% 95%  Weight:      Height:        Constitutional: Lethargic but arousable and oriented x3, patient is in mild respiratory distress. Skin: no rashes, no lesions, patient has evidence of eczema of all extremities. Eyes: Pupils are equally reactive to light.  No evidence of scleral icterus or conjunctival pallor.  ENMT: Moist mucous membranes noted.  Posterior pharynx clear of any exudate or lesions.   Neck: normal, supple, no masses, no thyromegaly.  No evidence of jugular venous distension.   Respiratory: Markedly diminished breath sounds in the right mid and lower fields with associated rales.  No  wheezing, patient is tachypneic and is unable to speak complete sentences. No accessory muscle use.  Cardiovascular: Regular rate and rhythm, no murmurs / rubs / gallops. No extremity edema. 2+ pedal pulses. No carotid bruits.  Chest:    Nontender without crepitus or deformity.   Back:   Nontender without crepitus or deformity. Abdomen: Abdomen is soft and nontender.  No evidence of intra-abdominal masses.  Positive bowel sounds noted in all quadrants.   Musculoskeletal: No joint deformity upper and lower extremities. Good ROM, no contractures. Normal muscle tone.  Neurologic: Sensation intact, patient is moving all 4 extremities spontaneously patient is following all commands.  Patient is responsive to verbal stimuli.   Psychiatric: Patient presents as a anxious mood with appropriate affect.  Patient currently does not seem to possess insight as to his current situation.   Labs on Admission: I have personally reviewed following labs and imaging studies -   CBC: Recent Labs  Lab 03/10/20 1827 03/10/20 2218  WBC 8.3  --   NEUTROABS 6.2  --   HGB 14.3 15.0  HCT 44.2 44.0  MCV 98.0  --   PLT 246  --    Basic Metabolic Panel: Recent Labs  Lab 03/10/20 1827 03/10/20 2218  NA 136 138  K 4.1 4.0  CL 93*  --   CO2 30  --   GLUCOSE 127*  --   BUN 11  --   CREATININE 0.98  --   CALCIUM 9.0  --    GFR: Estimated Creatinine Clearance: 48.6 mL/min (by C-G formula based on SCr of 0.98 mg/dL). Liver Function Tests: Recent Labs  Lab 03/10/20 1827  AST 18  ALT 10  ALKPHOS 96  BILITOT 0.7  PROT 7.6  ALBUMIN 3.1*   No results for input(s): LIPASE, AMYLASE in the last 168 hours. No results for input(s): AMMONIA in the last 168 hours. Coagulation Profile: Recent Labs  Lab 03/10/20 1827  INR 1.1   Cardiac Enzymes: No results for input(s): CKTOTAL, CKMB, CKMBINDEX, TROPONINI in the last 168 hours. BNP (last 3 results) No results for input(s): PROBNP in the last 8760 hours. HbA1C: No results for input(s): HGBA1C in the last 72 hours. CBG: No results for input(s): GLUCAP in the last 168 hours. Lipid Profile: No results for input(s): CHOL, HDL, LDLCALC, TRIG, CHOLHDL, LDLDIRECT in the last 72 hours. Thyroid  Function Tests: No results for input(s): TSH, T4TOTAL, FREET4, T3FREE, THYROIDAB in the last 72 hours. Anemia Panel: No results for input(s): VITAMINB12, FOLATE, FERRITIN, TIBC, IRON, RETICCTPCT in the last 72 hours. Urine analysis:    Component Value Date/Time   COLORURINE YELLOW 04/29/2019 2045   APPEARANCEUR CLEAR 04/29/2019 2045   LABSPEC 1.011 04/29/2019 2045   PHURINE 7.0 04/29/2019 2045   GLUCOSEU NEGATIVE 04/29/2019 2045   HGBUR  NEGATIVE 04/29/2019 2045   BILIRUBINUR NEGATIVE 04/29/2019 2045   BILIRUBINUR negative 03/23/2019 1420   BILIRUBINUR neg 06/03/2014 [place] 04/29/2019 2045   PROTEINUR NEGATIVE 04/29/2019 2045   UROBILINOGEN 1.0 03/23/2019 1420   NITRITE NEGATIVE 04/29/2019 2045   LEUKOCYTESUR NEGATIVE 04/29/2019 2045    Radiological Exams on Admission - Personally Reviewed: DG Chest 2 View  Result Date: 03/10/2020 CLINICAL DATA:  Shortness of breath. EXAM: CHEST - 2 VIEW COMPARISON:  July 30, 2019. FINDINGS: Stable cardiomediastinal silhouette. Moderate right pleural effusion is noted with probable underlying atelectasis or infiltrate. Mild left basilar atelectasis or infiltrate is noted. No pneumothorax is noted. Bony thorax is unremarkable. IMPRESSION: Moderate right pleural effusion is noted with probable underlying atelectasis or infiltrate. Mild left basilar atelectasis or infiltrate is noted. Electronically Signed   By: Marijo Conception M.D.   On: 03/10/2020 17:02    EKG: Personally reviewed.  Rhythm is sinus rhythm with frequent PVCs with heart rate of 99 bpm.  No dynamic ST segment changes appreciated.  Assessment/Plan Principal Problem:   Shortness of breath   Patient presenting with several week history of progressively worsening shortness of breath, productive cough with yellow sputum.  Of note, patient has developed an oxygen requirement over the past several weeks prompting his palliative care caretaker to initiate 3 L of submental  oxygen via nasal cannula at home.  Upon evaluation in the emergency department this evening patient is extremely tachypneic with respiratory rate range between 30 and 40 times per minute.  The symptoms are likely secondary to progressively enlarging right-sided pleural effusion.  The allergy of the effusion may be infection, congestive heart failure or malignancy.  Will attempt to place patient on a trial of 20 mg of IV Lasix  Will empirically place patient on empiric regimen of intravenous ceftriaxone and doxycycline which was already started by the emergency department staff.  Continue supplemental oxygen  Bronchodilator therapy as needed for shortness of breath and wheezing  We will arrange for right-sided thoracentesis by interventional radiology later this morning for diagnostic and therapeutic purposes.  This will include cytology to evaluate for any evidence of malignancy.  Active Problems:   Pleural effusion on right   Please see assessment and plan above    Mild intermittent asthma without complication   I do not believe patient is suffering from a superimposed asthma exacerbation at this time.  As needed bronchodilator therapy for shortness breath and wheezing    Essential hypertension   Continue home regimen of antihypertensive therapy    Chronic diastolic CHF (congestive heart failure) (HCC)   While congestive heart failure is a possible etiology of patient's right-sided pleural effusion, it would be quite odd for the right pleural space to be the only place the patient is experiencing cardiogenic volume overload.  That being said, we are placing the patient on a trial of 20 mg of IV Lasix.  We will keep patient on home regimen of oral Lasix after that which can easily be switched to additional doses of intravenous Lasix based on clinical improvement/course.    Mixed hyperlipidemia   Continue statin therapy   Goals of care  counseling/discussion   Patient is being followed by [facility] services  Patient has affirmed that he is a DNR on arrival to the emergency department  Daughter [name] Maisie Fus is the power of attorney and surrogate decision maker   Code Status:  DNR Family Communication: Attempted to call daughter and  left voicemail.  Emergency department provider has already spoken to the daughter and updated her on plan of care prior to my assessment.  Status is: Observation  The patient remains OBS appropriate and will d/c before 2 midnights.  Dispo: The patient is from: Home              Anticipated d/c is to: Home              Anticipated d/c date is: 2 days              Patient currently is not medically stable to d/c.        Vernelle Emerald MD [facility] Pager [phone]  If 7PM-7AM, please contact night-coverage [email or web address] Use universal [place] password for that web site. If you do not have the password, please call the hospital operator.  03/11/2020, 1:58 AM

## 2020-03-11 NOTE — ED Notes (Signed)
   03/11/20 1231  Provider Notification  Provider Name/Title Dr. Sloan Leiter  Date Provider Notified 03/11/20  Time Provider Notified 1228  Notification Type Page  Notification Reason Other (Comment) (NPO or need diet order)

## 2020-03-11 NOTE — ED Notes (Signed)
Attempted report 

## 2020-03-11 NOTE — Progress Notes (Signed)
Report received from Maryfrances Bunnell, RN and given to primary nurse.

## 2020-03-12 NOTE — Progress Notes (Signed)
PROGRESS NOTE    Zolton Chalifour  [id number] DOB: 12-30-1931 DOA: 03/10/2020 PCP: Rutherford Guys, MD    Brief Narrative:  86 year old gentleman with history of asthma, hypertension, diastolic dysfunction, MSSA bacteremia thought secondary to eczema treated in 06/2019, previous stroke, chronic hypoxic failure on 2 to 3 L of oxygen at home and debility presented to the emergency department increasing cough, sputum production and respiratory symptoms not improved with outpatient therapy.  Patient had outpatient chest x-ray done, showed right-sided pleural effusion and was sent to ER.  Patient was recently started on supplemental oxygen at home by home health palliative care team. In the emergency room, he was hemodynamically stable.  On 3 L oxygen.  Chest x-ray showed right-sided moderate pleural effusion.  Admitted for pneumonia, failed outpatient therapy.   Assessment & Plan:   Principal Problem:   Shortness of breath Active Problems:   Mild intermittent asthma without complication   Essential hypertension   Chronic diastolic CHF (congestive heart failure) (HCC)   Pleural effusion on right   Mixed hyperlipidemia   Pneumonia  Shortness of breath/community-acquired pneumonia/partially treated pneumonia with parapneumonic effusion: Cultures negative so far. Underwent ultrasound-guided thoracentesis, 1.2 L amber-colored fluid removed, exudate.  Remains on Rocephin and doxycycline.  We will continue today.  Continue breathing treatments and chest physiotherapy.  His symptoms are fairly stable today.  Cytology has been sent is pending.  Mild intermittent asthma without complication: Stable.  Bronchodilators as needed.  Hypertension: Blood pressures stable on home regimen.  Chronic diastolic congestive heart failure: On Lasix that we will continue.  Hyperlipidemia: On statin continue.   DVT prophylaxis: SCDs Code Status: DNR Family Communication: Patient's daughter on the  phone Disposition Plan: Status is: Inpatient  Remains inpatient appropriate because:IV treatments appropriate due to intensity of illness or inability to take PO   Dispo: The patient is from: Home              Anticipated d/c is to: Home              Anticipated d/c date is: 2 days              Patient currently is not medically stable to d/c.  Patient has exudative pleural effusion, failed outpatient oral antibiotic therapies.     Consultants:   None  Procedures:   Right thoracentesis  Antimicrobials:  Antibiotics Given (last 72 hours)    Date/Time Action Medication Dose Rate   03/10/20 2347 New Bag/Given   cefTRIAXone (ROCEPHIN) 1 g in sodium chloride 0.9 % 100 mL IVPB 1 g 200 mL/hr   03/10/20 2347 Given   doxycycline (VIBRA-TABS) tablet 100 mg 100 mg    03/11/20 1050 Given   doxycycline (VIBRA-TABS) tablet 100 mg 100 mg    03/11/20 2309 Given   doxycycline (VIBRA-TABS) tablet 100 mg 100 mg    03/11/20 2322 New Bag/Given   cefTRIAXone (ROCEPHIN) 1 g in sodium chloride 0.9 % 100 mL IVPB 1 g 200 mL/hr   03/12/20 1001 Given   doxycycline (VIBRA-TABS) tablet 100 mg 100 mg          Subjective: Patient seen and examined.  No overnight events.  Afebrile.  He is more communicating today.  Patient himself willing to go home.  Objective: Vitals:   03/11/20 1747 03/11/20 2018 03/11/20 2300 03/12/20 0750  BP: (!) 181/92  102/60 116/64  Pulse: 85  79 73  Resp: 17  16 19   Temp: 98.5 F (36.9 C)  97.8  F (36.6 C) 97.6 F (36.4 C)  TempSrc:   Oral   SpO2: 100% 100% 96% 97%  Weight:      Height:        Intake/Output Summary (Last 24 hours) at 03/12/2020 1100 Last data filed at 03/12/2020 [id number] Gross per 24 hour  Intake 100 ml  Output 825 ml  Net -725 ml   Filed Weights   03/10/20 1759  Weight: 63.5 kg    Examination:  General exam: Appears calm and comfortable, chronically sick looking.  Not in any distress.  On 2 L oxygen. Respiratory system: Clear to  auscultation. Respiratory effort normal.  Decreased air entry on the right base. Cardiovascular system: S1 & S2 heard, RRR. No JVD, murmurs, rubs, gallops or clicks. No pedal edema. Gastrointestinal system: Abdomen is nondistended, soft and nontender.  Central nervous system: Alert and oriented.  Moves all extremities.   Data Reviewed: I have personally reviewed following labs and imaging studies  CBC: Recent Labs  Lab 03/10/20 1827 03/10/20 2218 03/11/20 0543  WBC 8.3  --  6.3  NEUTROABS 6.2  --  4.3  HGB 14.3 15.0 13.4  HCT 44.2 44.0 41.4  MCV 98.0  --  97.4  PLT 246  --  [id number]   Basic Metabolic Panel: Recent Labs  Lab 03/10/20 1827 03/10/20 2218 03/11/20 0543  NA 136 138 142  K 4.1 4.0 3.7  CL 93*  --  97*  CO2 30  --  34*  GLUCOSE 127*  --  101*  BUN 11  --  9  CREATININE 0.98  --  0.87  CALCIUM 9.0  --  8.7*  MG  --   --  1.9   GFR: Estimated Creatinine Clearance: 54.7 mL/min (by C-G formula based on SCr of 0.87 mg/dL). Liver Function Tests: Recent Labs  Lab 03/10/20 1827 03/11/20 0543  AST 18 15  ALT 10 9  ALKPHOS 96 83  BILITOT 0.7 0.6  PROT 7.6 6.6  ALBUMIN 3.1* 2.7*   No results for input(s): LIPASE, AMYLASE in the last 168 hours. No results for input(s): AMMONIA in the last 168 hours. Coagulation Profile: Recent Labs  Lab 03/10/20 1827  INR 1.1   Cardiac Enzymes: No results for input(s): CKTOTAL, CKMB, CKMBINDEX, TROPONINI in the last 168 hours. BNP (last 3 results) No results for input(s): PROBNP in the last 8760 hours. HbA1C: No results for input(s): HGBA1C in the last 72 hours. CBG: No results for input(s): GLUCAP in the last 168 hours. Lipid Profile: No results for input(s): CHOL, HDL, LDLCALC, TRIG, CHOLHDL, LDLDIRECT in the last 72 hours. Thyroid Function Tests: No results for input(s): TSH, T4TOTAL, FREET4, T3FREE, THYROIDAB in the last 72 hours. Anemia Panel: No results for input(s): VITAMINB12, FOLATE, FERRITIN, TIBC, IRON,  RETICCTPCT in the last 72 hours. Sepsis Labs: Recent Labs  Lab 03/10/20 1827 03/10/20 2211  LATICACIDVEN 2.6* 2.5*    Recent Results (from the past 240 hour(s))  Culture, blood (Routine x 2)     Status: None (Preliminary result)   Collection Time: 03/10/20  6:29 PM   Specimen: BLOOD  Result Value Ref Range Status   Specimen Description BLOOD RIGHT ANTECUBITAL  Final   Special Requests   Final    BOTTLES DRAWN AEROBIC AND ANAEROBIC Blood Culture results may not be optimal due to an inadequate volume of blood received in culture bottles   Culture   Final    NO GROWTH 2 DAYS Performed at [facility],  [address] [place], [place] [address]    Report Status PENDING  Incomplete  Culture, blood (Routine x 2)     Status: None (Preliminary result)   Collection Time: 03/10/20 10:11 PM   Specimen: BLOOD  Result Value Ref Range Status   Specimen Description BLOOD SITE NOT SPECIFIED  Final   Special Requests   Final    BOTTLES DRAWN AEROBIC ONLY Blood Culture adequate volume   Culture   Final    NO GROWTH 2 DAYS Performed at [facility], [address] [place], [place] [address]    Report Status PENDING  Incomplete  SARS Coronavirus 2 by RT PCR (hospital order, performed in [facility] hospital lab) Nasopharyngeal Nasopharyngeal Swab     Status: None   Collection Time: 03/11/20  1:37 AM   Specimen: Nasopharyngeal Swab  Result Value Ref Range Status   SARS Coronavirus 2 NEGATIVE NEGATIVE Final    Comment: (NOTE) SARS-CoV-2 target nucleic acids are NOT DETECTED. The SARS-CoV-2 RNA is generally detectable in upper and lower respiratory specimens during the acute phase of infection. The lowest concentration of SARS-CoV-2 viral copies this assay can detect is 250 copies / mL. A negative result does not preclude SARS-CoV-2 infection and should not be used as the sole basis for treatment or other patient management decisions.  A negative result may occur with improper  specimen collection / handling, submission of specimen other than nasopharyngeal swab, presence of viral mutation(s) within the areas targeted by this assay, and inadequate number of viral copies (<250 copies / mL). A negative result must be combined with clinical observations, patient history, and epidemiological information. Fact Sheet for Patients:   StrictlyIdeas.no Fact Sheet for Healthcare Providers: BankingDealers.co.za This test is not yet approved or cleared  by the [place] FDA and has been authorized for detection and/or diagnosis of SARS-CoV-2 by FDA under an Emergency Use Authorization (EUA).  This EUA will remain in effect (meaning this test can be used) for the duration of the COVID-19 declaration under Section 564(b)(1) of the Act, 21 U.S.C. section 360bbb-3(b)(1), unless the authorization is terminated or revoked sooner. Performed at [facility], [place] [address] [place], [place] [address]   Culture, body fluid-bottle     Status: None (Preliminary result)   Collection Time: 03/11/20 10:00 AM   Specimen: Pleura  Result Value Ref Range Status   Specimen Description PLEURAL FLUID  Final   Special Requests NONE  Final   Culture   Final    NO GROWTH < 24 HOURS Performed at [facility], [place] [address], [place], [place] [address]    Report Status PENDING  Incomplete  Gram stain     Status: None   Collection Time: 03/11/20 10:00 AM   Specimen: Pleura  Result Value Ref Range Status   Specimen Description PLEURAL FLUID  Final   Special Requests NONE  Final   Gram Stain   Final    WBC PRESENT, PREDOMINANTLY MONONUCLEAR NO ORGANISMS SEEN CYTOSPIN SMEAR Performed at [facility], [place] [address], [place],  [address]    Report Status 03/11/2020 FINAL  Final         Radiology Studies: DG Chest 1 View  Result Date: 03/11/2020 CLINICAL DATA:  Status post thoracentesis. EXAM: CHEST  1 VIEW  COMPARISON:  Chest x-ray dated 03/10/2020. FINDINGS: Decreased size of the RIGHT pleural effusion status post thoracentesis. No pneumothorax is seen. Mild atelectasis and/or small pleural effusion at  the LEFT lung base, stable. LEFT lung otherwise clear. Heart size and mediastinal contours are stable. IMPRESSION: 1. Decreased size of the RIGHT pleural effusion status post thoracentesis. No pneumothorax seen. 2. Stable atelectasis and/or small pleural effusion at the LEFT lung base. Electronically Signed   By: Franki Cabot M.D.   On: 03/11/2020 10:46   DG Chest 2 View  Result Date: 03/10/2020 CLINICAL DATA:  Shortness of breath. EXAM: CHEST - 2 VIEW COMPARISON:  July 30, 2019. FINDINGS: Stable cardiomediastinal silhouette. Moderate right pleural effusion is noted with probable underlying atelectasis or infiltrate. Mild left basilar atelectasis or infiltrate is noted. No pneumothorax is noted. Bony thorax is unremarkable. IMPRESSION: Moderate right pleural effusion is noted with probable underlying atelectasis or infiltrate. Mild left basilar atelectasis or infiltrate is noted. Electronically Signed   By: Marijo Conception M.D.   On: 03/10/2020 17:02   US THORACENTESIS ASP PLEURAL SPACE W/IMG GUIDE  Result Date: 03/11/2020 INDICATION: Patient admitted with cough, found to have right-sided pleural effusion. Request is made for diagnostic and therapeutic thoracentesis. EXAM: ULTRASOUND GUIDED DIAGNOSTIC AND THERAPEUTIC RIGHT THORACENTESIS MEDICATIONS: 10 mL 1% lidocaine COMPLICATIONS: None immediate. PROCEDURE: An ultrasound guided thoracentesis was thoroughly discussed with the patient and questions answered. The benefits, risks, alternatives and complications were also discussed. The patient understands and wishes to proceed with the procedure. Written consent was obtained. Ultrasound was performed to localize and mark an adequate pocket of fluid in the right chest. The area was then prepped and draped in the  normal sterile fashion. 1% Lidocaine was used for local anesthesia. Under ultrasound guidance a 6 Fr Safe-T-Centesis catheter was introduced. Thoracentesis was performed. The catheter was removed and a dressing applied. FINDINGS: A total of approximately 1.2 liters of yellow fluid was removed. Samples were sent to the laboratory as requested by the clinical team. IMPRESSION: Successful ultrasound guided diagnostic and therapeutic right thoracentesis yielding 1.2 liters of pleural fluid. Read by: Brynda Greathouse PA-C Electronically Signed   By: Sandi Mariscal M.D.   On: 03/11/2020 16:05        Scheduled Meds: . budesonide  0.5 mg Inhalation BID  . doxycycline  100 mg Oral Q12H  . furosemide  20 mg Oral Daily  . metoprolol succinate  50 mg Oral QPC breakfast  . mirtazapine  15 mg Oral QHS  . montelukast  10 mg Oral QHS  . pravastatin  40 mg Oral Daily   Continuous Infusions: . cefTRIAXone (ROCEPHIN)  IV 1 g (03/11/20 2322)     LOS: 1 day    Time spent: 25 minutes    Barb Merino, MD [facility] Pager [phone]

## 2020-03-13 ENCOUNTER — Encounter: Payer: Self-pay | Admitting: Family Medicine

## 2020-03-13 ENCOUNTER — Other Ambulatory Visit: Payer: Self-pay | Admitting: Family Medicine

## 2020-03-13 ENCOUNTER — Inpatient Hospital Stay (HOSPITAL_COMMUNITY): Payer: Medicare HMO

## 2020-03-13 DIAGNOSIS — I10 Essential (primary) hypertension: Secondary | ICD-10-CM

## 2020-03-13 DIAGNOSIS — R000 Tachycardia, unspecified: Secondary | ICD-10-CM

## 2020-03-13 MED ORDER — DOXYCYCLINE HYCLATE 100 MG PO TABS: 100.0000 mg | ORAL_TABLET | Freq: Two times a day (BID) | ORAL | 0 refills | Status: AC

## 2020-03-13 NOTE — Progress Notes (Signed)
Patient discharged home with daughter with pallative care.

## 2020-03-13 NOTE — Discharge Summary (Signed)
Physician Discharge Summary  [name] DXA:[id number] DOB: 1932/10/08 DOA: 03/10/2020  PCP: Rutherford Guys, MD  Admit date: 03/10/2020 Discharge date: 03/13/2020  Admitted From: Home Disposition: Home  Recommendations for Outpatient Follow-up:  1. Follow up with PCP in 1-2 weeks   Discharge Condition: Fair CODE STATUS: DNR Diet recommendation: Regular diet, thickened liquids and aspiration precautions.  Discharge summary: 86 year old gentleman with history of asthma, hypertension, diastolic dysfunction, MSSA bacteremia thought secondary to eczema treated in 06/2019, previous stroke, chronic hypoxic failure on 2 to 3 L of oxygen at home and debility presented to the emergency department increasing cough, sputum production and respiratory symptoms not improved with outpatient therapy.  Patient had outpatient chest x-ray done, showed right-sided pleural effusion and was sent to ER.  Patient was recently started on supplemental oxygen at home by home health palliative care team.  He has received azithromycin as outpatient. In the emergency room, he was hemodynamically stable.  On 3 L oxygen.  Chest x-ray showed right-sided moderate pleural effusion.  Admitted for pneumonia, failed outpatient therapy.  Shortness of breath/right-sided pleural effusion/probably partially treated pneumonia along with chronic aspiration in a patient with advanced debility: Initial chest x-ray showed moderate right-sided pleural effusion, ultrasound guided thoracentesis yielded 1.2 L amber-colored fluid that is exudate.  Cultures no growth for 48 hours.  Neutrophil count 3000 in the fluid. Normal WBC count.  Afebrile. CT scan, 5/17 shows layering fluid on the right side, probable consolidation on the right lower lobe.  Patient with symptomatic improvement after thoracentesis.  Plan: Treated with doxycycline and Rocephin in the hospital.  Blood cultures negative.  Blood cultures no growth so far.  Patient with extreme  debility and frailty, no evidence of empyema.  Probably chronic organized pleural effusion with bacterial pneumonia.  Given his clinical circumstances, he is not a candidate for aggressive surgical therapy including VATS/bronchoscopy.  With his clinical stability, will treat with trial of antibiotics, doxycycline for 7 more days by mouth. He does have chronic aspiration and this is expected to gradually get worse. He will benefit with thoracentesis in the future for symptomatic treatment including shortness of breath or any recurrence of fever. Called and discussed in detail with patient's daughter about some of the irreversible conditions mostly aspiration pneumonia and progressive frailty. We will call with results if pleural fluid to grow any bacteria, or cytology to show any malignant cells.  Patient is fairly stable.  He feels more comfortable at home.  Discharged home with oral antibiotics.  Will follow up results.  Discharge Diagnoses:  Principal Problem:   Shortness of breath Active Problems:   Mild intermittent asthma without complication   Essential hypertension   Chronic diastolic CHF (congestive heart failure) (HCC)   Pleural effusion on right   Mixed hyperlipidemia   Pneumonia    Discharge Instructions  Discharge Instructions    Call MD for:  difficulty breathing, headache or visual disturbances   Complete by: As directed    Call MD for:  temperature >100.4   Complete by: As directed    Diet general   Complete by: As directed    Continue thickening the liquids . Aspiration precautions.   Increase activity slowly   Complete by: As directed      Allergies as of 03/13/2020      Reactions   Other Shortness Of Breath, Itching, Other (See Comments)   Seasonal allergies- Runny nose, congestion, itchy eyes, and wheezing      Medication List  STOP taking these medications   azithromycin 250 MG tablet Commonly known as: ZITHROMAX   beclomethasone 80 MCG/ACT  inhaler Commonly known as: QVAR   clopidogrel 75 MG tablet Commonly known as: PLAVIX   docusate sodium 100 MG capsule Commonly known as: COLACE     TAKE these medications   acetaminophen 500 MG tablet Commonly known as: TYLENOL Take 500-1,000 mg by mouth as needed for mild pain or headache (pain from eczema).   albuterol 108 (90 Base) MCG/ACT inhaler Commonly known as: ProAir HFA Inhale 1-2 puffs into the lungs every 6 (six) hours as needed for wheezing or shortness of breath.   alendronate 70 MG tablet Commonly known as: FOSAMAX TAKE 1 TABLET EVERY 7 DAYS. TAKE WITH A FULL GLASS OF WATER ON AN EMPTY STOMACH. What changed: See the new instructions.   Blood Pressure Kit Please check BP twice a day What changed:   how much to take  how to take this  when to take this  additional instructions   doxycycline 100 MG tablet Commonly known as: VIBRA-TABS Take 1 tablet (100 mg total) by mouth every 12 (twelve) hours for 7 days.   Flovent HFA 110 MCG/ACT inhaler Generic drug: fluticasone Inhale 2 puffs into the lungs 2 (two) times daily.   furosemide 20 MG tablet Commonly known as: LASIX TAKE 1 TABLET ONCE DAILY.   guaiFENesin-dextromethorphan 100-10 MG/5ML syrup Commonly known as: ROBITUSSIN DM Take 5 mLs by mouth every 8 (eight) hours as needed for cough.   metoprolol succinate 50 MG 24 hr tablet Commonly known as: TOPROL-XL TAKE 1 TABLET DAILY WITH OR IMMEDIATELY FOLLOWING A MEAL. What changed: See the new instructions.   mirtazapine 15 MG tablet Commonly known as: REMERON Take 1 tablet (15 mg total) by mouth at bedtime.   montelukast 10 MG tablet Commonly known as: SINGULAIR Take 1 tablet (10 mg total) by mouth at bedtime.   pravastatin 40 MG tablet Commonly known as: PRAVACHOL TAKE 1 TABLET ONCE DAILY.   traZODone 50 MG tablet Commonly known as: DESYREL Take 1-2 tablets (50-100 mg total) by mouth at bedtime.   triamcinolone 0.025 %  ointment Commonly known as: KENALOG Apply 1 application topically 2 (two) times daily.   Walker Misc Use walker whenever walking, standard walker What changed:   how much to take  how to take this  when to take this       Allergies  Allergen Reactions  . Other Shortness Of Breath, Itching and Other (See Comments)    Seasonal allergies- Runny nose, congestion, itchy eyes, and wheezing    Procedures/Studies: DG Chest 1 View  Result Date: 03/11/2020 CLINICAL DATA:  Status post thoracentesis. EXAM: CHEST  1 VIEW COMPARISON:  Chest x-ray dated 03/10/2020. FINDINGS: Decreased size of the RIGHT pleural effusion status post thoracentesis. No pneumothorax is seen. Mild atelectasis and/or small pleural effusion at the LEFT lung base, stable. LEFT lung otherwise clear. Heart size and mediastinal contours are stable. IMPRESSION: 1. Decreased size of the RIGHT pleural effusion status post thoracentesis. No pneumothorax seen. 2. Stable atelectasis and/or small pleural effusion at the LEFT lung base. Electronically Signed   By: Franki Cabot M.D.   On: 03/11/2020 10:46   DG Chest 2 View  Result Date: 03/10/2020 CLINICAL DATA:  Shortness of breath. EXAM: CHEST - 2 VIEW COMPARISON:  July 30, 2019. FINDINGS: Stable cardiomediastinal silhouette. Moderate right pleural effusion is noted with probable underlying atelectasis or infiltrate. Mild left basilar atelectasis or infiltrate is noted.  No pneumothorax is noted. Bony thorax is unremarkable. IMPRESSION: Moderate right pleural effusion is noted with probable underlying atelectasis or infiltrate. Mild left basilar atelectasis or infiltrate is noted. Electronically Signed   By: Marijo Conception M.D.   On: 03/10/2020 17:02   CT CHEST WO CONTRAST  Result Date: 03/13/2020 CLINICAL DATA:  Persistent pleural effusion with history of bacteremia EXAM: CT CHEST WITHOUT CONTRAST TECHNIQUE: Multidetector CT imaging of the chest was performed following the  standard protocol without IV contrast. COMPARISON:  Chest radiograph Mar 11, 2020; chest CT July 14, 2019 FINDINGS: Cardiovascular: There is no appreciable thoracic aortic aneurysm. There are occasional foci of calcification in visualized great vessels. There are foci of aortic atherosclerosis as well as multiple foci of coronary artery calcification. There is no pericardial effusion or pericardial thickening. There is calcification in each carotid artery. Mediastinum/Nodes: There is diffuse enlargement of the left lobe of the thyroid with deviation of the upper thoracic trachea to the right. Multiple masses are noted in the left lobe of the thyroid, largest measuring 3.1 x 2.7 cm. There are several subcentimeter mediastinal lymph nodes. There is a lymph node in the right pretracheal region measuring 1.7 x 1.6 cm. No esophageal lesions are appreciable. Lungs/Pleura: There is a sizable right pleural effusion with small left pleural effusion. There is compressive atelectasis in the right lower lobe with suspected superimposed pneumonia in this area. There is compressive atelectasis in the left lower lobe region. Upper Abdomen: There is upper abdominal aortic atherosclerosis. Gallbladder appears somewhat distended without gallbladder wall thickening. There is a cyst in the left lobe of the liver measuring 1.3 x 1.0 cm. Visualized upper abdominal structures otherwise appear unremarkable. Musculoskeletal: There is stable anterior wedging of the T5 vertebral body with increased kyphosis in this area. There is chronic spondylolisthesis at C4-5 and C5-6. No blastic or lytic bone lesions are evident. No chest wall lesions. IMPRESSION: 1. Sizable pleural effusion on the right, layering, with compressive atelectasis and suspected superimposed pneumonia right lower lobe. 2. Much smaller left pleural effusion with apparent compressive atelectasis left base. 3. Enlarged pretracheal lymph node slightly to the right of midline  measuring 1.7 x 1.6 cm. No other adenopathy by size criteria evident. Multiple subcentimeter lymph nodes noted. Etiology for this enlarged lymph node uncertain. It may well have reactive etiology given the parenchymal lung abnormalities present. 4. Enlarged left lobe of thyroid with masses in this area, largest measuring 3.1 x 2.7 cm. In the setting of significant comorbidities or limited life expectancy, no follow-up recommended. Clinical assessment in this regard advised. (Ref: J Am Coll Radiol. 2015 Feb;12(2): 143-50). 5. Aortic atherosclerosis. Foci of great vessel and coronary artery calcification noted. 6. There is a degree of distention of the gallbladder without wall thickening. This is a finding of questionable significance. Aortic Atherosclerosis (ICD10-I70.0). Electronically Signed   By: Lowella Grip III M.D.   On: 03/13/2020 09:35   MR BRAIN W WO CONTRAST  Result Date: 02/29/2020 CLINICAL DATA:  Gait instability.  Cognitive decline. EXAM: MRI HEAD WITHOUT AND WITH CONTRAST TECHNIQUE: Multiplanar, multiecho pulse sequences of the brain and surrounding structures were obtained without and with intravenous contrast. CONTRAST:  89m MULTIHANCE GADOBENATE DIMEGLUMINE 529 MG/ML IV SOLN COMPARISON:  MRI head 04/27/2019 FINDINGS: Brain: Moderate atrophy, unchanged. Chronic microvascular ischemic changes in the white matter. Chronic infarct in chronic hemorrhage in the right anterior basal ganglia unchanged. Chronic infarcts in the cerebellum bilaterally. Negative for acute infarct.  Negative for mass or  edema Postcontrast images are degraded by motion however showed no pathologic enhancement. Vascular: Normal arterial flow voids. Skull and upper cervical spine: Negative Sinuses/Orbits: Mild mucosal edema paranasal sinuses. Bilateral cataract extraction Other: None IMPRESSION: Atrophy and chronic ischemic changes. No acute abnormality and no change from the prior MRI. Electronically Signed   By: Franchot Gallo M.D.   On: 02/29/2020 14:24   MR CERVICAL SPINE WO CONTRAST  Result Date: 02/29/2020 CLINICAL DATA:  86 year old male with gait instability, loss of balance, cognitive decline. No known injury. EXAM: MRI CERVICAL SPINE WITHOUT CONTRAST TECHNIQUE: Multiplanar, multisequence MR imaging of the cervical spine was performed. No intravenous contrast was administered. COMPARISON:  Brain MRI today reported separately. CTA neck 03/07/2019. chest CTA 07/14/2019. FINDINGS: Alignment: Straightening of cervical lordosis with multilevel mild spondylolisthesis, stable since 2020. Anterolisthesis is most pronounced at C5 on C6. Vertebrae: No marrow edema or evidence of acute osseous abnormality. Normal background bone marrow signal. Cord: Spinal cord signal is within normal limits at all visualized levels. Posterior Fossa, vertebral arteries, paraspinal tissues: Cervicomedullary junction is within normal limits. Brain details reported separately today. Preserved major vascular flow voids in the neck. Large chronic cystic mass in the left thyroid appears stable to mildly larger than in 2020, approximately 54 mm diameter now. This was sampled with ultrasound-guided FNA in December. Correlate with histology (ref: J Am Coll Radiol. 2015 Feb;12(2): 143-50). There is chronic associated regional mass effect at the left thoracic inlet. No superimposed cervical lymphadenopathy. Abnormal T2 and STIR hyperintense signal in the right lung apex (series 5, image 1), the right upper lung was clear in May 2020. There was a small layering right pleural effusion in September. Disc levels: C2-C3: Disc space loss with mild disc bulging. Endplate spurring and moderate ligament flavum hypertrophy. Borderline to mild spinal and mild bilateral C3 foraminal stenosis. C3-C4: Disc space loss with mild anterolisthesis. Mild facet and ligament flavum hypertrophy. Borderline to mild spinal stenosis and moderate bilateral C4 foraminal stenosis. C4-C5:  Mild anterolisthesis with disc space loss. Mostly far lateral disc osteophyte complex and mild to moderate facet hypertrophy. No significant spinal stenosis. Mild to moderate left C5 foraminal stenosis. C5-C6: Anterolisthesis with disc space loss. Circumferential disc osteophyte complex. Mild to moderate facet hypertrophy. No spinal stenosis. Moderate to severe left and moderate right C6 foraminal stenosis. C6-C7: Disc space loss with circumferential disc osteophyte complex and mild ligament flavum hypertrophy. No significant spinal stenosis. Moderate bilateral C7 foraminal stenosis. C7-T1: Mild anterolisthesis with endplate and facet spurring. Mild ligament flavum hypertrophy. No significant stenosis. No upper thoracic spinal stenosis. IMPRESSION: 1. Abnormal signal in the visible right lung apex which was clear on neck CTA last year. This might reflect a large right pleural effusion. Recommend dedicated chest imaging (x-ray or CT). 2. Cervical spine degeneration the setting of widespread spondylolisthesis. Up to mild degenerative spinal stenosis with no spinal cord mass effect or signal abnormality. Up to severe degenerative neural foraminal stenosis at the left C6 nerve level, multilevel moderate foraminal stenosis otherwise. 3. Chronic cystic left thyroid mass which was biopsied by ultrasound FNA in December. Electronically Signed   By: Genevie Ann M.D.   On: 02/29/2020 14:40   US THORACENTESIS ASP PLEURAL SPACE W/IMG GUIDE  Result Date: 03/11/2020 INDICATION: Patient admitted with cough, found to have right-sided pleural effusion. Request is made for diagnostic and therapeutic thoracentesis. EXAM: ULTRASOUND GUIDED DIAGNOSTIC AND THERAPEUTIC RIGHT THORACENTESIS MEDICATIONS: 10 mL 1% lidocaine COMPLICATIONS: None immediate. PROCEDURE: An ultrasound guided thoracentesis was  thoroughly discussed with the patient and questions answered. The benefits, risks, alternatives and complications were also discussed. The  patient understands and wishes to proceed with the procedure. Written consent was obtained. Ultrasound was performed to localize and mark an adequate pocket of fluid in the right chest. The area was then prepped and draped in the normal sterile fashion. 1% Lidocaine was used for local anesthesia. Under ultrasound guidance a 6 Fr Safe-T-Centesis catheter was introduced. Thoracentesis was performed. The catheter was removed and a dressing applied. FINDINGS: A total of approximately 1.2 liters of yellow fluid was removed. Samples were sent to the laboratory as requested by the clinical team. IMPRESSION: Successful ultrasound guided diagnostic and therapeutic right thoracentesis yielding 1.2 liters of pleural fluid. Read by: Brynda Greathouse PA-C Electronically Signed   By: Sandi Mariscal M.D.   On: 03/11/2020 16:05      Subjective: Patient seen and examined.  No overnight events.  Afebrile.  Denies any shortness of breath at rest.  He is asking when he is able to go home.   Discharge Exam: Vitals:   03/12/20 2210 03/13/20 0816  BP: 100/67 125/75  Pulse: 77 73  Resp: 18   Temp: 98 F (36.7 C) 98 F (36.7 C)  SpO2: 97% 98%   Vitals:   03/12/20 0750 03/12/20 1613 03/12/20 2210 03/13/20 0816  BP: 116/64  100/67 125/75  Pulse: 73 75 77 73  Resp: '19 19 18   ' Temp: 97.6 F (36.4 C) 98.9 F (37.2 C) 98 F (36.7 C) 98 F (36.7 C)  TempSrc:   Oral Oral  SpO2: 97% 96% 97% 98%  Weight:      Height:        General: Pt is alert, awake, not in acute distress, on 2 L oxygen looks comfortable. Cardiovascular: RRR, S1/S2 +, no rubs, no gallops Respiratory: Decreased air entry on the right posterior base, otherwise no added sounds. Abdominal: Soft, NT, ND, bowel sounds + Extremities: no edema, no cyanosis    The results of significant diagnostics from this hospitalization (including imaging, microbiology, ancillary and laboratory) are listed below for reference.     Microbiology: Recent Results  (from the past 240 hour(s))  Culture, blood (Routine x 2)     Status: None (Preliminary result)   Collection Time: 03/10/20  6:29 PM   Specimen: BLOOD  Result Value Ref Range Status   Specimen Description BLOOD RIGHT ANTECUBITAL  Final   Special Requests   Final    BOTTLES DRAWN AEROBIC AND ANAEROBIC Blood Culture results may not be optimal due to an inadequate volume of blood received in culture bottles   Culture   Final    NO GROWTH 3 DAYS Performed at [facility], [place] [address], [place], [place] [address]    Report Status PENDING  Incomplete  Culture, blood (Routine x 2)     Status: None (Preliminary result)   Collection Time: 03/10/20 10:11 PM   Specimen: BLOOD  Result Value Ref Range Status   Specimen Description BLOOD SITE NOT SPECIFIED  Final   Special Requests   Final    BOTTLES DRAWN AEROBIC ONLY Blood Culture adequate volume   Culture   Final    NO GROWTH 3 DAYS Performed at [facility], [address], [place], [place] [address]    Report Status PENDING  Incomplete  SARS Coronavirus 2 by RT PCR (hospital order, performed in [facility] hospital lab) Nasopharyngeal Nasopharyngeal Swab  Status: None   Collection Time: 03/11/20  1:37 AM   Specimen: Nasopharyngeal Swab  Result Value Ref Range Status   SARS Coronavirus 2 NEGATIVE NEGATIVE Final    Comment: (NOTE) SARS-CoV-2 target nucleic acids are NOT DETECTED. The SARS-CoV-2 RNA is generally detectable in upper and lower respiratory specimens during the acute phase of infection. The lowest concentration of SARS-CoV-2 viral copies this assay can detect is 250 copies / mL. A negative result does not preclude SARS-CoV-2 infection and should not be used as the sole basis for treatment or other patient management decisions.  A negative result may occur with improper specimen collection / handling, submission of specimen other than nasopharyngeal swab, presence of viral mutation(s) within the areas targeted  by this assay, and inadequate number of viral copies (<250 copies / mL). A negative result must be combined with clinical observations, patient history, and epidemiological information. Fact Sheet for Patients:   StrictlyIdeas.no Fact Sheet for Healthcare Providers: BankingDealers.co.za This test is not yet approved or cleared  by the [place] FDA and has been authorized for detection and/or diagnosis of SARS-CoV-2 by FDA under an Emergency Use Authorization (EUA).  This EUA will remain in effect (meaning this test can be used) for the duration of the COVID-19 declaration under Section 564(b)(1) of the Act, 21 U.S.C. section 360bbb-3(b)(1), unless the authorization is terminated or revoked sooner. Performed at [facility], [place] [address], [place], [place] [address]   Culture, body fluid-bottle     Status: None (Preliminary result)   Collection Time: 03/11/20 10:00 AM   Specimen: Pleura  Result Value Ref Range Status   Specimen Description PLEURAL FLUID  Final   Special Requests NONE  Final   Culture   Final    NO GROWTH 2 DAYS Performed at [facility] [address] [place], [place] [address]    Report Status PENDING  Incomplete  Gram stain     Status: None   Collection Time: 03/11/20 10:00 AM   Specimen: Pleura  Result Value Ref Range Status   Specimen Description PLEURAL FLUID  Final   Special Requests NONE  Final   Gram Stain   Final    WBC PRESENT, PREDOMINANTLY MONONUCLEAR NO ORGANISMS SEEN CYTOSPIN SMEAR Performed at [facility], [place] [address] [place], [place] [address]    Report Status 03/11/2020 FINAL  Final     Labs: BNP (last 3 results) Recent Labs    07/14/19 1441 03/10/20 2216  BNP 586.2* 096.2*   Basic Metabolic Panel: Recent Labs  Lab 03/10/20 1827 03/10/20 2218 03/11/20 0543  NA 136 138 142  K 4.1 4.0 3.7  CL 93*  --  97*  CO2 30  --  34*  GLUCOSE 127*  --  101*   BUN 11  --  9  CREATININE 0.98  --  0.87  CALCIUM 9.0  --  8.7*  MG  --   --  1.9   Liver Function Tests: Recent Labs  Lab 03/10/20 1827 03/11/20 0543  AST 18 15  ALT 10 9  ALKPHOS 96 83  BILITOT 0.7 0.6  PROT 7.6 6.6  ALBUMIN 3.1* 2.7*   No results for input(s): LIPASE, AMYLASE in the last 168 hours. No results for input(s): AMMONIA in the last 168 hours. CBC: Recent Labs  Lab 03/10/20 1827 03/10/20 2218 03/11/20 0543  WBC 8.3  --  6.3  NEUTROABS 6.2  --  4.3  HGB 14.3 15.0 13.4  HCT 44.2 44.0 41.4  MCV 98.0  --  97.4  PLT 246  --  217   Cardiac Enzymes: No results for input(s): CKTOTAL, CKMB, CKMBINDEX, TROPONINI in the last 168 hours. BNP: Invalid input(s): POCBNP CBG: No results for input(s): GLUCAP in the last 168 hours. D-Dimer No results for input(s): DDIMER in the last 72 hours. Hgb A1c No results for input(s): HGBA1C in the last 72 hours. Lipid Profile No results for input(s): CHOL, HDL, LDLCALC, TRIG, CHOLHDL, LDLDIRECT in the last 72 hours. Thyroid function studies No results for input(s): TSH, T4TOTAL, T3FREE, THYROIDAB in the last 72 hours.  Invalid input(s): FREET3 Anemia work up No results for input(s): VITAMINB12, FOLATE, FERRITIN, TIBC, IRON, RETICCTPCT in the last 72 hours. Urinalysis    Component Value Date/Time   COLORURINE STRAW (A) 03/11/2020 0623   APPEARANCEUR CLEAR 03/11/2020 0623   LABSPEC 1.008 03/11/2020 0623   PHURINE 6.0 03/11/2020 0623   GLUCOSEU NEGATIVE 03/11/2020 0623   HGBUR NEGATIVE 03/11/2020 0623   BILIRUBINUR NEGATIVE 03/11/2020 0623   BILIRUBINUR negative 03/23/2019 1420   BILIRUBINUR neg 06/03/2014 1014   KETONESUR NEGATIVE 03/11/2020 0623   PROTEINUR NEGATIVE 03/11/2020 0623   UROBILINOGEN 1.0 03/23/2019 1420   NITRITE NEGATIVE 03/11/2020 0623   LEUKOCYTESUR NEGATIVE 03/11/2020 0623   Sepsis Labs Invalid input(s): PROCALCITONIN,  WBC,  LACTICIDVEN Microbiology Recent Results (from the past 240 hour(s))   Culture, blood (Routine x 2)     Status: None (Preliminary result)   Collection Time: 03/10/20  6:29 PM   Specimen: BLOOD  Result Value Ref Range Status   Specimen Description BLOOD RIGHT ANTECUBITAL  Final   Special Requests   Final    BOTTLES DRAWN AEROBIC AND ANAEROBIC Blood Culture results may not be optimal due to an inadequate volume of blood received in culture bottles   Culture   Final    NO GROWTH 3 DAYS Performed at [facility], [place] [address], [place], [place] [address]    Report Status PENDING  Incomplete  Culture, blood (Routine x 2)     Status: None (Preliminary result)   Collection Time: 03/10/20 10:11 PM   Specimen: BLOOD  Result Value Ref Range Status   Specimen Description BLOOD SITE NOT SPECIFIED  Final   Special Requests   Final    BOTTLES DRAWN AEROBIC ONLY Blood Culture adequate volume   Culture   Final    NO GROWTH 3 DAYS Performed at [facility], [address] [place], [place] [id number]    Report Status PENDING  Incomplete  SARS Coronavirus 2 by RT PCR (hospital order, performed in [facility] hospital lab) Nasopharyngeal Nasopharyngeal Swab     Status: None   Collection Time: 03/11/20  1:37 AM   Specimen: Nasopharyngeal Swab  Result Value Ref Range Status   SARS Coronavirus 2 NEGATIVE NEGATIVE Final    Comment: (NOTE) SARS-CoV-2 target nucleic acids are NOT DETECTED. The SARS-CoV-2 RNA is generally detectable in upper and lower respiratory specimens during the acute phase of infection. The lowest concentration of SARS-CoV-2 viral copies this assay can detect is 250 copies / mL. A negative result does not preclude SARS-CoV-2 infection and should not be used as the sole basis for treatment or other patient management decisions.  A negative result may occur with improper specimen collection / handling, submission of specimen other than nasopharyngeal swab, presence of viral mutation(s) within the areas targeted by this assay, and  inadequate number of viral copies (<250  copies / mL). A negative result must be combined with clinical observations, patient history, and epidemiological information. Fact Sheet for Patients:   StrictlyIdeas.no Fact Sheet for Healthcare Providers: BankingDealers.co.za This test is not yet approved or cleared  by the [place] FDA and has been authorized for detection and/or diagnosis of SARS-CoV-2 by FDA under an Emergency Use Authorization (EUA).  This EUA will remain in effect (meaning this test can be used) for the duration of the COVID-19 declaration under Section 564(b)(1) of the Act, 21 U.S.C. section 360bbb-3(b)(1), unless the authorization is terminated or revoked sooner. Performed at [facility], [place] [address], [place], [place] [address]   Culture, body fluid-bottle     Status: None (Preliminary result)   Collection Time: 03/11/20 10:00 AM   Specimen: Pleura  Result Value Ref Range Status   Specimen Description PLEURAL FLUID  Final   Special Requests NONE  Final   Culture   Final    NO GROWTH 2 DAYS Performed at [facility] [address], [place], [place] [address]    Report Status PENDING  Incomplete  Gram stain     Status: None   Collection Time: 03/11/20 10:00 AM   Specimen: Pleura  Result Value Ref Range Status   Specimen Description PLEURAL FLUID  Final   Special Requests NONE  Final   Gram Stain   Final    WBC PRESENT, PREDOMINANTLY MONONUCLEAR NO ORGANISMS SEEN CYTOSPIN SMEAR Performed at [facility], [place] [address], [place],  [address]    Report Status 03/11/2020 FINAL  Final     Time coordinating discharge:  35 minutes  SIGNED:   Barb Merino, MD  [facility] 03/13/2020, 10:43 AM

## 2020-03-13 NOTE — Plan of Care (Signed)

## 2020-03-13 NOTE — Progress Notes (Signed)
Just spoke with patients daughter and she said the patient only uses 3 L of O2 at night.  I was concerned he would need O2 for his ride home.  I put the pulse ox on patient and had him stand with the walker and he remained at 96 on 1 ltr,  Took the oxygen off and had him take two steps with his walker and he remained at 95.  Had him walk to the door in his room and he ranged from 93-96 on room air. I told the social worker that he was ok for his ride home with no SpO2.

## 2020-03-13 NOTE — Telephone Encounter (Signed)
Pt is currently hospitalized.  Requested Prescriptions  Pending Prescriptions Disp Refills  . pravastatin (PRAVACHOL) 40 MG tablet [Pharmacy Med Name: PRAVASTATIN SODIUM 40 MG TAB] 90 tablet 0    Sig: TAKE 1 TABLET ONCE DAILY.     Cardiovascular:  Antilipid - Statins Failed - 03/13/2020  9:12 AM      Failed - Total Cholesterol in normal range and within 360 days    Cholesterol, Total  Date Value Ref Range Status  03/11/2018 161 100 - 199 mg/dL Final   Cholesterol  Date Value Ref Range Status  03/08/2019 153 0 - 200 mg/dL Final         Failed - LDL in normal range and within 360 days    LDL Calculated  Date Value Ref Range Status  03/11/2018 79 0 - 99 mg/dL Final   LDL Cholesterol  Date Value Ref Range Status  03/08/2019 80 0 - 99 mg/dL Final    Comment:           Total Cholesterol/HDL:CHD Risk Coronary Heart Disease Risk Table                     Men   Women  1/2 Average Risk   3.4   3.3  Average Risk       5.0   4.4  2 X Average Risk   9.6   7.1  3 X Average Risk  23.4   11.0        Use the calculated Patient Ratio above and the CHD Risk Table to determine the patient's CHD Risk.        ATP III CLASSIFICATION (LDL):  <100     mg/dL   Optimal  100-129  mg/dL   Near or Above                    Optimal  130-159  mg/dL   Borderline  160-189  mg/dL   High  >190     mg/dL   Very High Performed at [facility] [address] [place], [place] [address]    Direct LDL  Date Value Ref Range Status  06/03/2014 101 (H) mg/dL Final    Comment:    ATP III Classification (LDL):       < 100        mg/dL         Optimal      100 - 129     mg/dL         Near or Above Optimal      130 - 159     mg/dL         Borderline High      160 - 189     mg/dL         High       > 190        mg/dL         Very High           Failed - HDL in normal range and within 360 days    HDL  Date Value Ref Range Status  03/08/2019 66 >40 mg/dL Final  03/11/2018 64 >39 mg/dL Final          Failed - Triglycerides in normal range and within 360 days    Triglycerides  Date Value Ref Range Status  03/08/2019 36 <150 mg/dL Final         Passed - Patient  is not pregnant      Passed - Valid encounter within last 12 months    Recent Outpatient Visits          2 weeks ago SOB (shortness of breath)   [facility], Lilia Argue, MD   3 weeks ago Chest congestion   [facility], Lilia Argue, MD   3 weeks ago Mild intermittent asthma with acute exacerbation   [facility], Delfino Lovett, NP   2 months ago Dysphagia, unspecified type   [facility], Lilia Argue, MD   3 months ago Acute cognitive decline   [facility], Lilia Argue, MD             . furosemide (LASIX) 20 MG tablet [Pharmacy Med Name: FUROSEMIDE 20 MG TABLET] 30 tablet 0    Sig: TAKE 1 TABLET ONCE DAILY.     Cardiovascular:  Diuretics - Loop Failed - 03/13/2020  9:12 AM      Failed - Ca in normal range and within 360 days    Calcium  Date Value Ref Range Status  03/11/2020 8.7 (L) 8.9 - 10.3 mg/dL Final   Calcium, Ion  Date Value Ref Range Status  03/10/2020 1.08 (L) 1.15 - 1.40 mmol/L Final         Passed - K in normal range and within 360 days    Potassium  Date Value Ref Range Status  03/11/2020 3.7 3.5 - 5.1 mmol/L Final         Passed - Na in normal range and within 360 days    Sodium  Date Value Ref Range Status  03/11/2020 142 135 - 145 mmol/L Final  11/19/2019 139 134 - 144 mmol/L Final         Passed - Cr in normal range and within 360 days    Creat  Date Value Ref Range Status  09/25/2016 1.18 (H) 0.70 - 1.11 mg/dL Final    Comment:      For patients > or = 84 years of age: The upper reference limit for Creatinine is approximately 13% higher for people identified as African-American.      Creatinine, Ser  Date Value Ref Range Status  03/11/2020 0.87 0.61 - 1.24 mg/dL Final         Passed - Last BP  in normal range    BP Readings from Last 1 Encounters:  03/13/20 125/75         Passed - Valid encounter within last 6 months    Recent Outpatient Visits          2 weeks ago SOB (shortness of breath)   [facility], Lilia Argue, MD   3 weeks ago Chest congestion   [facility], Lilia Argue, MD   3 weeks ago Mild intermittent asthma with acute exacerbation   [facility], Delfino Lovett, NP   2 months ago Dysphagia, unspecified type   [facility], Lilia Argue, MD   3 months ago Acute cognitive decline   [facility], Lilia Argue, MD             . alendronate (FOSAMAX) 70 MG tablet [Pharmacy Med Name: ALENDRONATE SODIUM 70 MG TAB] 4 tablet 0    Sig: TAKE 1 TABLET EVERY 7 DAYS. TAKE WITH A FULL GLASS OF WATER ON AN EMPTY STOMACH.  Endocrinology:  Bisphosphonates Failed - 03/13/2020  9:12 AM      Failed - Ca in normal range and within 360 days    Calcium  Date Value Ref Range Status  03/11/2020 8.7 (L) 8.9 - 10.3 mg/dL Final   Calcium, Ion  Date Value Ref Range Status  03/10/2020 1.08 (L) 1.15 - 1.40 mmol/L Final         Failed - Vitamin D in normal range and within 360 days    Vit D, 25-Hydroxy  Date Value Ref Range Status  03/30/2017 36.2 30.0 - 100.0 ng/mL Final    Comment:    (NOTE) Vitamin D deficiency has been defined by the Institute of Medicine and an Endocrine Society practice guideline as a level of serum 25-OH vitamin D less than 20 ng/mL (1,2). The Endocrine Society went on to further define vitamin D insufficiency as a level between 21 and 29 ng/mL (2). 1. IOM (Institute of Medicine). 2010. Dietary reference   intakes for calcium and D. [place]: The   [employer]. 2. Holick MF, Binkley Wheatcroft, Bischoff-Ferrari HA, et al.   Evaluation, treatment, and prevention of vitamin D   deficiency: an Endocrine Society clinical practice   guideline. JCEM. 2011 Jul;  96(7):1911-30. Performed At: [facility] [place], [place] [id number] Lindon Romp MD [id number] - Valid encounter within last 12 months    Recent Outpatient Visits          2 weeks ago SOB (shortness of breath)   [facility], Lilia Argue, MD   3 weeks ago Chest congestion   [facility], Lilia Argue, MD   3 weeks ago Mild intermittent asthma with acute exacerbation   [facility], Delfino Lovett, NP   2 months ago Dysphagia, unspecified type   [facility], Lilia Argue, MD   3 months ago Acute cognitive decline   [facility], Lilia Argue, MD             . metoprolol succinate (TOPROL-XL) 50 MG 24 hr tablet [Pharmacy Med Name: METOPROLOL SUCC ER 50 MG TAB] 90 tablet 0    Sig: TAKE 1 TABLET DAILY WITH OR IMMEDIATELY FOLLOWING A MEAL.     Cardiovascular:  Beta Blockers Passed - 03/13/2020  9:12 AM      Passed - Last BP in normal range    BP Readings from Last 1 Encounters:  03/13/20 125/75         Passed - Last Heart Rate in normal range    Pulse Readings from Last 1 Encounters:  03/13/20 73         Passed - Valid encounter within last 6 months    Recent Outpatient Visits          2 weeks ago SOB (shortness of breath)   [facility], Lilia Argue, MD   3 weeks ago Chest congestion   [facility], Lilia Argue, MD   3 weeks ago Mild intermittent asthma with acute exacerbation   [facility], Delfino Lovett, NP   2 months ago Dysphagia, unspecified type   [facility], Lilia Argue, MD   3 months ago Acute cognitive decline   [facility], Lilia Argue, MD

## 2020-03-14 LAB — CYTOLOGY - NON PAP

## 2020-03-15 LAB — CULTURE, BLOOD (ROUTINE X 2)
Culture: NO GROWTH
Culture: NO GROWTH
Special Requests: ADEQUATE

## 2020-03-16 ENCOUNTER — Telehealth: Payer: Self-pay

## 2020-03-16 LAB — CULTURE, BODY FLUID W GRAM STAIN -BOTTLE: Culture: NO GROWTH

## 2020-03-16 NOTE — Telephone Encounter (Signed)
Patient daughter notified And chest xray was done. Noted patient was discharged from Hospital for Pleural Effusion.

## 2020-03-20 ENCOUNTER — Other Ambulatory Visit: Payer: Self-pay

## 2020-03-20 NOTE — Patient Outreach (Signed)
[facility]) Care Management  03/20/2020  Reiner Robicheau [birth year] [id number]   Red emmi referral: Reason for alert:  Scheduled follow up - no Date of emi call---03/19/2020  Placed call to patient and daughter answered. Daughter states that she makes MD appointments and will all today to make an appointment. Offered to assist and daughter declined. Reports her father is doing well so far.  PLAN: case closure as no needs identified.   Tomasa Rand, RN, BSN, CEN [facility] [employer] [phone]

## 2020-03-24 ENCOUNTER — Other Ambulatory Visit: Payer: Self-pay

## 2020-03-24 ENCOUNTER — Encounter: Payer: Self-pay | Admitting: Family Medicine

## 2020-03-24 ENCOUNTER — Ambulatory Visit (INDEPENDENT_AMBULATORY_CARE_PROVIDER_SITE_OTHER): Payer: Medicare HMO | Admitting: Family Medicine

## 2020-03-24 VITALS — BP 138/78 | HR 83 | Temp 98.2°F | Ht 66.5 in | Wt 136.5 lb

## 2020-03-24 DIAGNOSIS — R0602 Shortness of breath: Secondary | ICD-10-CM

## 2020-03-24 DIAGNOSIS — Z8709 Personal history of other diseases of the respiratory system: Secondary | ICD-10-CM

## 2020-03-24 DIAGNOSIS — L89152 Pressure ulcer of sacral region, stage 2: Secondary | ICD-10-CM

## 2020-03-24 NOTE — Progress Notes (Signed)
5/28/20214:05 PM  [name] 1932-07-23, 86 y.o., male [id number]  Chief Complaint  Patient presents with  . Fall/ wheelchair bound today    on sunday. brusie on toe and knee, unsteady on his feet.   . check ears    HPI:   Patient is a 86 y.o. male with past medical history significant for asthma, hypertension, diastolic dysfunction, MSSA bacteremia thought secondary to eczema treated in 06/2019, previous stroke, chronic hypoxic failure on 2 to 3 L of oxygen  who presents today for hosp followup  hosp on may 14 - 17 2021 for CAP failed outpatient therapy cxr mod pleural effusion - throacentesis of 1.2L exudate, tx with doxy and rocephin. Chronic aspiration. Not surgical candidate, stopped plavix  Patient here with caregiver who provides most of history given patients cognitive and functional decline She reports that since most recent hospitalization patient has been weaker, ambulating less, tiring easily, having falls. today noticed rapid breathing with minimal exertion  Uses walker at home  Palliative care has visited once, prior to hospitalization  Caregiver also reports sacral ulcer that she noticed when he came back from hospital Patients uses adult diapers due to urinary incontinence  Patient reports that he feels fine and nothing hurts or bothers him  He refuses all thickened liquids They are practicing exercises given by speech therapy  Denies any fever or chills Cough improved, sometimes productive, today noticed thicker yellow sputum, denies any blood Denies wheezing   Depression screen [facility] 2/9 03/24/2020 02/24/2020 02/18/2020  Decreased Interest 2 2 0  Down, Depressed, Hopeless 0 1 0  PHQ - 2 Score 2 3 0  Altered sleeping 0 1 -  Tired, decreased energy 3 1 -  Change in appetite 3 2 -  Feeling bad or failure about yourself  - 1 -  Trouble concentrating 0 1 -  Moving slowly or fidgety/restless 1 1 -  Suicidal thoughts 0 0 -  PHQ-9 Score 9 10 -  Difficult  doing work/chores Somewhat difficult Not difficult at all -  Some recent data might be hidden    Fall Risk  03/24/2020 02/24/2020 02/18/2020 02/15/2020 01/31/2020  Falls in the past year? 1 0 0 0 0  Comment - - - - -  Number falls in past yr: 1 0 0 0 0  Injury with Fall? 1 0 0 0 0  Comment - - - - -  Risk Factor Category  - - - - -  Risk for fall due to : - - - - -  Follow up Falls evaluation completed Falls evaluation completed Falls evaluation completed Falls evaluation completed -     Allergies  Allergen Reactions  . Other Shortness Of Breath, Itching and Other (See Comments)    Seasonal allergies- Runny nose, congestion, itchy eyes, and wheezing    Prior to Admission medications   Medication Sig Start Date End Date Taking? Authorizing Provider  acetaminophen (TYLENOL) 500 MG tablet Take 500-1,000 mg by mouth as needed for mild pain or headache (pain from eczema).    Yes [provider]  albuterol (PROAIR HFA) 108 (90 Base) MCG/ACT inhaler Inhale 1-2 puffs into the lungs every 6 (six) hours as needed for wheezing or shortness of breath. 11/19/19  Yes Rutherford Guys, MD  alendronate (FOSAMAX) 70 MG tablet TAKE 1 TABLET EVERY 7 DAYS. TAKE WITH A FULL GLASS OF WATER ON AN EMPTY STOMACH. 03/13/20  Yes Rutherford Guys, MD  Blood Pressure KIT Please check BP twice  a day Patient taking differently: 1 each by Other route in the morning and at bedtime.  03/25/19  Yes Rutherford Guys, MD  fluticasone (FLOVENT HFA) 110 MCG/ACT inhaler Inhale 2 puffs into the lungs 2 (two) times daily. 02/24/20  Yes Rutherford Guys, MD  furosemide (LASIX) 20 MG tablet TAKE 1 TABLET ONCE DAILY. 03/13/20  Yes Rutherford Guys, MD  guaiFENesin-dextromethorphan [facility] DM) 100-10 MG/5ML syrup Take 5 mLs by mouth every 8 (eight) hours as needed for cough. 02/15/20  Yes Maximiano Coss, NP  metoprolol succinate (TOPROL-XL) 50 MG 24 hr tablet TAKE 1 TABLET DAILY WITH OR IMMEDIATELY FOLLOWING A MEAL. 03/13/20  Yes  Rutherford Guys, MD  mirtazapine (REMERON) 15 MG tablet Take 1 tablet (15 mg total) by mouth at bedtime. 02/24/20  Yes Rutherford Guys, MD  Misc. Devices (WALKER) MISC Use walker whenever walking, standard walker Patient taking differently: 1 each by Other route See admin instructions. Use walker whenever walking, standard walker 07/27/18  Yes Rutherford Guys, MD  montelukast (SINGULAIR) 10 MG tablet Take 1 tablet (10 mg total) by mouth at bedtime. 02/15/20  Yes Maximiano Coss, NP  pravastatin (PRAVACHOL) 40 MG tablet TAKE 1 TABLET ONCE DAILY. 03/13/20  Yes Rutherford Guys, MD  traZODone (DESYREL) 50 MG tablet Take 1-2 tablets (50-100 mg total) by mouth at bedtime. 11/19/19  Yes Rutherford Guys, MD  triamcinolone (KENALOG) 0.025 % ointment Apply 1 application topically 2 (two) times daily. 11/19/19  Yes Rutherford Guys, MD    Past Medical History:  Diagnosis Date  . Allergy    seasonal  fall  . Asthma    as child  . Cancer ([place])    Basal cell carcinoma; multiple  . Chronic diastolic CHF (congestive heart failure) ([place]) 03/11/2020  . Dyspnea   . Eczema   . Hyperlipidemia   . Hypertension   . MSSA bacteremia 2020  . Osteoporosis   . Pleural effusion due to CHF (congestive heart failure) ([place])   . Stroke [facility])     Past Surgical History:  Procedure Laterality Date  . CATARACT EXTRACTION, BILATERAL  06/29/2015  . INTRAMEDULLARY (IM) NAIL INTERTROCHANTERIC Left 03/30/2017   Procedure: INTRAMEDULLARY (IM) NAIL INTERTROCHANTRIC;  Surgeon: [name], MD;  Location: [place];  Service: Orthopedics;  Laterality: Left;  . INTRAMEDULLARY (IM) NAIL INTERTROCHANTERIC Right 11/12/2018   Procedure: INTRAMEDULLARY (IM) NAIL RIGHT INTERTROCHANTERIC HIP FRACTURE;  Surgeon: [name], MD;  Location: [facility];  Service: Orthopedics;  Laterality: Right;  . MOHS SURGERY    . TEE WITHOUT CARDIOVERSION N/A 07/20/2019   Procedure: TRANSESOPHAGEAL ECHOCARDIOGRAM (TEE);  Surgeon: [name], MD;   Location[facility] ENDOSCOPY;  Service: Cardiovascular;  Laterality: N/A;  . TONSILLECTOMY  1940 maybe    Social History   Tobacco Use  . Smoking status: Never Smoker  . Smokeless tobacco: Never Used  Substance Use Topics  . Alcohol use: No    Comment: occas    Family History  Problem Relation Age of Onset  . Arthritis Mother   . Heart disease Mother 91  . Hypertension Father     ROS Per hpi  OBJECTIVE:  Today's Vitals   03/24/20 1547  BP: 138/78  Pulse: 83  Temp: 98.2 F (36.8 C)  TempSrc: Temporal  SpO2: 93%  Weight: 136 lb 8 oz (61.9 kg)  Height: 5' 6.5" (1.689 m)   Body mass index is 21.7 kg/m.   Wt Readings from Last 3 Encounters:  03/24/20 136 lb 8 oz (61.9 kg)  03/10/20 140 lb (63.5 kg)  02/18/20 136 lb 8 oz (61.9 kg)     Physical Exam   Gen: AAOx3, NAD, fraile, in wheelchair, moves onto exam table with assistance HEENT: PERRLA, EOMI, conjunctiva are clear. TMs are pearly gray. MMM Resp: normal respiratory effort, decreased breaths sound right lower 2/3. No rales, ronchi or wheezing. CV: RRR, normal S1/S2, no mr/rg Abd: soft, ND/NT Ext: no edema Skin: ~ 6 inches sacral erythema with a small 1 inch area of broken skin, minimal drainage.   No results found for this or any previous visit (from the past 24 hour(s)).  No results found.   ASSESSMENT and PLAN  1. SOB (shortness of breath) 2. History of pleural effusion Discussed concerns for return of pleural effusion. HD stable. No xray available today in clinic. Declines xray elsewhere, will return on Tuesday for CXR. ER precautions given. Consider IR for therapeutic drainage if CXR shows effusion vs pulm for further recommendations.  - Ambulatory referral to [facility] Chest 2 View; Future  3. Sacral decubitus ulcer, stage II ([place]) Discussed use of hydrogel wound dressing with tefla nonstick for coverage. Roxie referal made for monitoring/mgt of pressure ulcer.  - Ambulatory referral to [place]  Return for pending CXR results.    Rutherford Guys, MD [facility] at [facility] [place], [place] [address] Ph.  [phone] Fax [phone]

## 2020-03-24 NOTE — Patient Instructions (Signed)
° ° ° °  If you have lab work done today you will be contacted with your lab results within the next 2 weeks.  If you have not heard from us then please contact us. The fastest way to get your results is to register for My Chart. ° ° °IF you received an x-ray today, you will receive an invoice from [facility]. Please contact [facility] at [phone] with questions or concerns regarding your invoice.  ° °IF you received labwork today, you will receive an invoice from [facility]. Please contact [facility] at [phone] with questions or concerns regarding your invoice.  ° °Our billing staff will not be able to assist you with questions regarding bills from these companies. ° °You will be contacted with the lab results as soon as they are available. The fastest way to get your results is to activate your My Chart account. Instructions are located on the last page of this paperwork. If you have not heard from us regarding the results in 2 weeks, please contact this office. °  ° ° ° °

## 2020-03-26 ENCOUNTER — Encounter: Payer: Self-pay | Admitting: Family Medicine

## 2020-03-28 ENCOUNTER — Other Ambulatory Visit: Payer: Self-pay

## 2020-03-28 ENCOUNTER — Ambulatory Visit (INDEPENDENT_AMBULATORY_CARE_PROVIDER_SITE_OTHER): Payer: Medicare HMO | Admitting: Family Medicine

## 2020-03-28 ENCOUNTER — Telehealth: Payer: Self-pay

## 2020-03-28 ENCOUNTER — Ambulatory Visit (INDEPENDENT_AMBULATORY_CARE_PROVIDER_SITE_OTHER): Payer: Medicare HMO

## 2020-03-28 DIAGNOSIS — R0602 Shortness of breath: Secondary | ICD-10-CM

## 2020-03-28 DIAGNOSIS — J90 Pleural effusion, not elsewhere classified: Secondary | ICD-10-CM

## 2020-03-28 NOTE — Telephone Encounter (Signed)
Update: Caryl Pina called back and let me know Olegario Shearer had only meant that [facility] could not perform this. [facility] is going to send Interventional radiology the order so this can be preformed

## 2020-03-28 NOTE — Telephone Encounter (Signed)
Vicky with [facility] called but could not get through.  She wanted to say that they cannot withdrawal of fluid from pt's lungs.  It is done at [facility].  Please call Caryl Pina at [phone] or jennifer at [phone].

## 2020-03-28 NOTE — Progress Notes (Signed)
DG Chest 2 View  Result Date: 03/28/2020 CLINICAL DATA:  Shortness of breath EXAM: CHEST - 2 VIEW COMPARISON:  03/11/2020 and CT 03/13/2020. FINDINGS: Motion degraded lateral view. Enlargement of a moderate right pleural effusion. Tiny left pleural effusion is unchanged. The frontal view is degraded by the patient's chin, overlying the upper chest. Mild cardiomegaly. Atherosclerosis in the transverse aorta. No pneumothorax. Worsened right lower lobe airspace disease. Persistent left base Airspace disease, likely atelectasis. Remote left rib fractures. IMPRESSION: Worsened right-sided aeration, secondary to a progressive, moderate pleural effusion and adjacent airspace disease. Similar tiny left pleural effusion. Electronically Signed   By: Abigail Miyamoto M.D.   On: 03/28/2020 14:26    1. Shortness of breath - DG Chest 2 View - Ambulatory referral to Interventional Radiology  2. Recurrent pleural effusion on right - Ambulatory referral to Interventional Radiology - Ambulatory referral to Pulmonolgy

## 2020-03-28 NOTE — Telephone Encounter (Signed)
I have called pt and relayed the xray results per provider request.   I then called [facility] to expedite the referral to Interventional Radiology to see if they can possibly drain the right plural cavity due to the fluid build up.   I have left Vickie from [facility] department two voicemail's about the patient and faxed over the paper referrals to [phone].  Pt and provider notified.   Thanks,  [employer]

## 2020-03-28 NOTE — Telephone Encounter (Signed)
Called back and spoke with Caryl Pina who stated that they in fact can draw off for pleuracentesis and they just need an order faxed over. She is checking if there was a reason we were told he could not have it done there and getting back to me.

## 2020-03-29 ENCOUNTER — Encounter: Payer: Self-pay | Admitting: Family Medicine

## 2020-03-29 ENCOUNTER — Telehealth: Payer: Self-pay | Admitting: Family Medicine

## 2020-03-29 ENCOUNTER — Other Ambulatory Visit: Payer: Self-pay | Admitting: Family Medicine

## 2020-03-29 ENCOUNTER — Other Ambulatory Visit (HOSPITAL_COMMUNITY): Payer: Self-pay | Admitting: Family Medicine

## 2020-03-29 ENCOUNTER — Telehealth: Payer: Self-pay

## 2020-03-29 DIAGNOSIS — J90 Pleural effusion, not elsewhere classified: Secondary | ICD-10-CM

## 2020-03-29 NOTE — Telephone Encounter (Signed)
I called Caryl Pina back and confirmed that the order is in correctly and they will be calling pt today to get him scheduled.   Thanks.

## 2020-03-29 NOTE — Telephone Encounter (Signed)
LVM with Caryl Pina to cb and let me or the pt know when the appt is set for.

## 2020-03-29 NOTE — Telephone Encounter (Signed)
Caryl Pina returning [place] call wanting to let her know that an order needs to put  In by provider for fluid  removal or (Thoracentesis) scheduler can see it Any questions call Caryl Pina at  [phone]

## 2020-03-29 NOTE — Telephone Encounter (Signed)
Please Advise

## 2020-03-30 ENCOUNTER — Telehealth: Payer: Self-pay

## 2020-03-30 ENCOUNTER — Telehealth: Payer: Self-pay | Admitting: Family Medicine

## 2020-03-30 NOTE — Telephone Encounter (Signed)
Pt's daughter called to report low blood oxygen levels for patient. She says that his blood oxygen dropped to 83, and now he is at 90 staying still/not talking. Pt's daughter wants urgent call back because she wants to know if waiting until Monday to have his procedure (removing fluid from lungs) is waiting too late? Please advise

## 2020-03-30 NOTE — Telephone Encounter (Signed)
Pt daughter calling again regarding the message below. Pt daughter would like a call regarding this matter. Please advise.

## 2020-03-30 NOTE — Telephone Encounter (Signed)
Pt appt is for monday

## 2020-03-30 NOTE — Telephone Encounter (Signed)
Pt's daughter called to report low blood oxygen levels for patient. She says that his blood oxygen dropped to 83, and now he is at 90 staying still/not talking. Pt's daughter wants urgent call back because she wants to know if waiting until Monday to have his procedure (removing fluid from lungs) is waiting too late? Please advise  Best contact: [phone]

## 2020-03-30 NOTE — Telephone Encounter (Signed)
Interventional radiology called about the pt's IR thorecentisis. The specialists wanted the practice to know the pt's visit will be delayed until they are able to obtain a negative COVID test.

## 2020-03-30 NOTE — Telephone Encounter (Signed)
Spoke with daughter, [name] They have been putting and taking off oxygen for most of the day Currently on 3L, O2 90%, oxygen placed and moved into bed shortly before our phone call Breathing calming down Schedule for thoracentesis thru IR on Monday Referral made to pulm, has not received call yet Discussed extra dose of lasix now, increase oxygen to 4L, monitor for O2 sats and/or increased work of breathing, if not better ER today.

## 2020-03-31 ENCOUNTER — Inpatient Hospital Stay (HOSPITAL_COMMUNITY)
Admission: EM | Admit: 2020-03-31 | Discharge: 2020-04-27 | DRG: 177 | Disposition: E | Payer: Medicare HMO | Attending: Internal Medicine | Admitting: Internal Medicine

## 2020-03-31 ENCOUNTER — Ambulatory Visit (HOSPITAL_COMMUNITY): Payer: Medicare HMO

## 2020-03-31 ENCOUNTER — Telehealth: Payer: Self-pay

## 2020-03-31 ENCOUNTER — Telehealth: Payer: Self-pay | Admitting: Family Medicine

## 2020-03-31 DIAGNOSIS — I5032 Chronic diastolic (congestive) heart failure: Secondary | ICD-10-CM | POA: Diagnosis not present

## 2020-03-31 DIAGNOSIS — I110 Hypertensive heart disease with heart failure: Secondary | ICD-10-CM | POA: Diagnosis present

## 2020-03-31 DIAGNOSIS — J690 Pneumonitis due to inhalation of food and vomit: Secondary | ICD-10-CM | POA: Diagnosis not present

## 2020-03-31 DIAGNOSIS — Z85828 Personal history of other malignant neoplasm of skin: Secondary | ICD-10-CM

## 2020-03-31 DIAGNOSIS — Z7983 Long term (current) use of bisphosphonates: Secondary | ICD-10-CM

## 2020-03-31 DIAGNOSIS — I69391 Dysphagia following cerebral infarction: Secondary | ICD-10-CM

## 2020-03-31 DIAGNOSIS — R008 Other abnormalities of heart beat: Secondary | ICD-10-CM | POA: Diagnosis present

## 2020-03-31 DIAGNOSIS — J181 Lobar pneumonia, unspecified organism: Secondary | ICD-10-CM | POA: Diagnosis not present

## 2020-03-31 DIAGNOSIS — R64 Cachexia: Secondary | ICD-10-CM | POA: Diagnosis not present

## 2020-03-31 DIAGNOSIS — R1312 Dysphagia, oropharyngeal phase: Secondary | ICD-10-CM | POA: Diagnosis present

## 2020-03-31 DIAGNOSIS — J449 Chronic obstructive pulmonary disease, unspecified: Secondary | ICD-10-CM | POA: Diagnosis not present

## 2020-03-31 DIAGNOSIS — M810 Age-related osteoporosis without current pathological fracture: Secondary | ICD-10-CM | POA: Diagnosis present

## 2020-03-31 DIAGNOSIS — J948 Other specified pleural conditions: Secondary | ICD-10-CM | POA: Diagnosis not present

## 2020-03-31 DIAGNOSIS — F329 Major depressive disorder, single episode, unspecified: Secondary | ICD-10-CM | POA: Diagnosis present

## 2020-03-31 DIAGNOSIS — Z7401 Bed confinement status: Secondary | ICD-10-CM

## 2020-03-31 DIAGNOSIS — E785 Hyperlipidemia, unspecified: Secondary | ICD-10-CM | POA: Diagnosis present

## 2020-03-31 DIAGNOSIS — R4182 Altered mental status, unspecified: Secondary | ICD-10-CM | POA: Diagnosis not present

## 2020-03-31 DIAGNOSIS — Z6820 Body mass index (BMI) 20.0-20.9, adult: Secondary | ICD-10-CM | POA: Diagnosis not present

## 2020-03-31 DIAGNOSIS — R54 Age-related physical debility: Secondary | ICD-10-CM | POA: Diagnosis not present

## 2020-03-31 DIAGNOSIS — J90 Pleural effusion, not elsewhere classified: Secondary | ICD-10-CM

## 2020-03-31 DIAGNOSIS — Z20822 Contact with and (suspected) exposure to covid-19: Secondary | ICD-10-CM | POA: Diagnosis present

## 2020-03-31 DIAGNOSIS — Z515 Encounter for palliative care: Secondary | ICD-10-CM | POA: Diagnosis not present

## 2020-03-31 DIAGNOSIS — Z79899 Other long term (current) drug therapy: Secondary | ICD-10-CM | POA: Diagnosis not present

## 2020-03-31 DIAGNOSIS — R1319 Other dysphagia: Secondary | ICD-10-CM | POA: Diagnosis not present

## 2020-03-31 DIAGNOSIS — I10 Essential (primary) hypertension: Secondary | ICD-10-CM

## 2020-03-31 DIAGNOSIS — Z7189 Other specified counseling: Secondary | ICD-10-CM

## 2020-03-31 DIAGNOSIS — J9611 Chronic respiratory failure with hypoxia: Secondary | ICD-10-CM | POA: Diagnosis not present

## 2020-03-31 DIAGNOSIS — Z8701 Personal history of pneumonia (recurrent): Secondary | ICD-10-CM

## 2020-03-31 DIAGNOSIS — D72821 Monocytosis (symptomatic): Secondary | ICD-10-CM | POA: Diagnosis present

## 2020-03-31 DIAGNOSIS — J918 Pleural effusion in other conditions classified elsewhere: Secondary | ICD-10-CM | POA: Diagnosis not present

## 2020-03-31 DIAGNOSIS — Z9889 Other specified postprocedural states: Secondary | ICD-10-CM

## 2020-03-31 DIAGNOSIS — I5033 Acute on chronic diastolic (congestive) heart failure: Secondary | ICD-10-CM | POA: Diagnosis not present

## 2020-03-31 DIAGNOSIS — R069 Unspecified abnormalities of breathing: Secondary | ICD-10-CM | POA: Diagnosis not present

## 2020-03-31 DIAGNOSIS — R404 Transient alteration of awareness: Secondary | ICD-10-CM | POA: Diagnosis not present

## 2020-03-31 DIAGNOSIS — Z8261 Family history of arthritis: Secondary | ICD-10-CM | POA: Diagnosis not present

## 2020-03-31 DIAGNOSIS — R0602 Shortness of breath: Secondary | ICD-10-CM | POA: Diagnosis not present

## 2020-03-31 DIAGNOSIS — Z8249 Family history of ischemic heart disease and other diseases of the circulatory system: Secondary | ICD-10-CM

## 2020-03-31 DIAGNOSIS — R0902 Hypoxemia: Secondary | ICD-10-CM

## 2020-03-31 DIAGNOSIS — Z66 Do not resuscitate: Secondary | ICD-10-CM | POA: Diagnosis not present

## 2020-03-31 MED ORDER — ACETAMINOPHEN 500 MG PO TABS: 1000.0000 mg | ORAL_TABLET | Freq: Four times a day (QID) | ORAL | Status: DC | PRN

## 2020-03-31 MED ORDER — METOPROLOL SUCCINATE ER 50 MG PO TB24
50.0000 mg | ORAL_TABLET | Freq: Every day | ORAL | Status: DC
  Filled 2020-03-31: qty 1

## 2020-03-31 MED ORDER — SODIUM CHLORIDE 0.9 % IV SOLN: 250.0000 mL | INTRAVENOUS | Status: DC | PRN

## 2020-03-31 MED ORDER — ONDANSETRON HCL 4 MG PO TABS: 4.0000 mg | ORAL_TABLET | Freq: Four times a day (QID) | ORAL | Status: DC | PRN

## 2020-03-31 MED ORDER — MIRTAZAPINE 15 MG PO TABS
15.0000 mg | ORAL_TABLET | Freq: Every day | ORAL | Status: DC
  Administered 2020-03-31 – 2020-04-02 (×3): 15 mg via ORAL
  Filled 2020-03-31 (×4): qty 1

## 2020-03-31 MED ORDER — ENSURE ENLIVE PO LIQD
237.0000 mL | Freq: Two times a day (BID) | ORAL | Status: DC
  Administered 2020-04-02: 237 mL via ORAL

## 2020-03-31 MED ORDER — IPRATROPIUM-ALBUTEROL 0.5-2.5 (3) MG/3ML IN SOLN: 3.0000 mL | Freq: Four times a day (QID) | RESPIRATORY_TRACT | Status: DC | PRN

## 2020-03-31 MED ORDER — MONTELUKAST SODIUM 10 MG PO TABS
10.0000 mg | ORAL_TABLET | Freq: Every day | ORAL | Status: DC
  Administered 2020-03-31 – 2020-04-02 (×3): 10 mg via ORAL
  Filled 2020-03-31 (×4): qty 1

## 2020-03-31 MED ORDER — SODIUM CHLORIDE 0.9% FLUSH
3.0000 mL | INTRAVENOUS | Status: DC | PRN
  Administered 2020-04-02: 3 mL via INTRAVENOUS

## 2020-03-31 MED ORDER — ONDANSETRON HCL 4 MG/2ML IJ SOLN: 4.0000 mg | Freq: Four times a day (QID) | INTRAMUSCULAR | Status: DC | PRN

## 2020-03-31 MED ORDER — PRAVASTATIN SODIUM 40 MG PO TABS
40.0000 mg | ORAL_TABLET | Freq: Every day | ORAL | Status: DC
  Administered 2020-04-02: 40 mg via ORAL
  Filled 2020-03-31 (×2): qty 1

## 2020-03-31 MED ORDER — BUDESONIDE 0.25 MG/2ML IN SUSP
0.2500 mg | Freq: Two times a day (BID) | RESPIRATORY_TRACT | Status: DC
  Administered 2020-04-01 – 2020-04-02 (×4): 0.25 mg via RESPIRATORY_TRACT
  Filled 2020-03-31 (×6): qty 2

## 2020-03-31 MED ORDER — TRAZODONE HCL 50 MG PO TABS
50.0000 mg | ORAL_TABLET | Freq: Every day | ORAL | Status: DC
  Administered 2020-03-31 – 2020-04-01 (×2): 50 mg via ORAL
  Filled 2020-03-31 (×2): qty 1

## 2020-03-31 MED ORDER — FUROSEMIDE 10 MG/ML IJ SOLN
40.0000 mg | Freq: Every day | INTRAMUSCULAR | Status: DC
  Administered 2020-03-31 – 2020-04-01 (×2): 40 mg via INTRAVENOUS
  Filled 2020-03-31 (×2): qty 4

## 2020-03-31 MED ORDER — SODIUM CHLORIDE 0.9% FLUSH
3.0000 mL | Freq: Two times a day (BID) | INTRAVENOUS | Status: DC
  Administered 2020-03-31 – 2020-04-04 (×7): 3 mL via INTRAVENOUS

## 2020-03-31 NOTE — Progress Notes (Signed)
[facility] notified that pt daughter has concerns that her father NPO status she stated that he only had ensure at 26. Arthor Captain LPN

## 2020-03-31 NOTE — ED Notes (Signed)
The pt was registered under the wrong name that was discovered at 29  A new chart was registered and we are now waiting for the old chart to be merged with the new chart  The pt is alert and knows his name and dob  But he is unable to tell me where he lives  His daughter was supposed to come but according to the odd going nurse  The daughter has never showed up here and the pt does not know her number

## 2020-03-31 NOTE — Telephone Encounter (Signed)
Pt daughter called and wanted to let provider know that pt is going to the hospital and that the prosedure that was sch for Monday at the hospital might be sooner and also she stated the pt is going to need the portable oxygen before he could leave the hospital. Pt daughter also wanted it documented that pt had a Covid test sch today and can not make that because of the circumstance. Please advise.

## 2020-03-31 NOTE — Telephone Encounter (Signed)
Lapel the rx for o2 to [phone]

## 2020-03-31 NOTE — ED Notes (Signed)
The pts daughter has finally arrived

## 2020-03-31 NOTE — ED Notes (Signed)
Pt called and unable to locate in waiting room.

## 2020-03-31 NOTE — Plan of Care (Signed)

## 2020-03-31 NOTE — H&P (Signed)
History and Physical    [name] LAG:[id number] DOB: [birth year] DOA: 04/14/2020  PCP: Rutherford Guys, MD   Patient coming from: Home  I have personally briefly reviewed patient's old medical records in [place]  Chief Complaint:   HPI: [name] is a 86 y.o. male with medical history significant for asthma, hypertension, diastolic dysfunction, MSSA bacteremia thought secondary to eczema treated in 06/2019, previous stroke, chronic hypoxic failure on 2 to 3 L of oxygen at home and debility who presented to the emergency department via EMS for evaluation of hypoxia.  Patient's family called EMS because his pulse oximetry on 2 to 3 L of oxygen was in the 80s and they had to increase it to 4 L to maintain his pulse oximetry 99%.  Patient also noted to have labored breathing as well as audible wheezes.  Patient was recently hospitalized and is status post thoracentesis for right pleural effusion.  Patient was scheduled for another paracentesis on Monday 04/03/20. Chest x-ray showed large right pleural effusion and asymmetric right-sided edema. Small left pleural effusion. Twelve-lead EKG showed sinus rhythm with ventricular bigeminy      ED Course: Elderly male with a history of chronic hypoxic respiratory failure usually on 2 to 3 L of oxygen who was brought in by EMS for evaluation of worsening shortness of breath associated with an increased oxygen requirement to maintain pulse oximetry greater than 92%.  Patient at baseline normally wears 2 to 3 L of oxygen but is currently requiring 4 L to maintain pulse oximetry greater than 92%.  Chest x-ray shows a large right pleural effusion.  Patient will be admitted to the hospital for further evaluation  Review of Systems: As per HPI otherwise 10 point review of systems negative.    Past Medical History:  Diagnosis Date  . Allergy    seasonal  fall  . Asthma    as child  . Cancer ([place])    Basal cell carcinoma; multiple  . Chronic  diastolic CHF (congestive heart failure) ([place]) 03/11/2020  . Dyspnea   . Eczema   . Hyperlipidemia   . Hypertension   . MSSA bacteremia 2020  . Osteoporosis   . Pleural effusion due to CHF (congestive heart failure) ([place])   . Stroke [facility])     Past Surgical History:  Procedure Laterality Date  . CATARACT EXTRACTION, BILATERAL  06/29/2015  . INTRAMEDULLARY (IM) NAIL INTERTROCHANTERIC Left 03/30/2017   Procedure: INTRAMEDULLARY (IM) NAIL INTERTROCHANTRIC;  Surgeon: [name], MD;  Location: [place];  Service: Orthopedics;  Laterality: Left;  . INTRAMEDULLARY (IM) NAIL INTERTROCHANTERIC Right 11/12/2018   Procedure: INTRAMEDULLARY (IM) NAIL RIGHT INTERTROCHANTERIC HIP FRACTURE;  Surgeon: [name], MD;  Location: [place];  Service: Orthopedics;  Laterality: Right;  . MOHS SURGERY    . TEE WITHOUT CARDIOVERSION N/A 07/20/2019   Procedure: TRANSESOPHAGEAL ECHOCARDIOGRAM (TEE);  Surgeon: [name], MD;  Location[facility] ENDOSCOPY;  Service: Cardiovascular;  Laterality: N/A;  . TONSILLECTOMY  1940 maybe     reports that he has never smoked. He has never used smokeless tobacco. He reports that he does not drink alcohol or use drugs.  Allergies  Allergen Reactions  . Other Shortness Of Breath, Itching and Other (See Comments)    Seasonal allergies- Runny nose, congestion, itchy eyes, and wheezing    Family History  Problem Relation Age of Onset  . Arthritis Mother   . Heart disease Mother 77  . Hypertension Father  Prior to Admission medications   Medication Sig Start Date End Date Taking? Authorizing Provider  acetaminophen (TYLENOL) 500 MG tablet Take 500-1,000 mg by mouth as needed for mild pain or headache (pain from eczema).     [provider]  albuterol (PROAIR HFA) 108 (90 Base) MCG/ACT inhaler Inhale 1-2 puffs into the lungs every 6 (six) hours as needed for wheezing or shortness of breath. 11/19/19   Rutherford Guys, MD  alendronate (FOSAMAX) 70 MG tablet  TAKE 1 TABLET EVERY 7 DAYS. TAKE WITH A FULL GLASS OF WATER ON AN EMPTY STOMACH. 03/13/20   Rutherford Guys, MD  Blood Pressure KIT Please check BP twice a day Patient taking differently: 1 each by Other route in the morning and at bedtime.  03/25/19   Rutherford Guys, MD  fluticasone (FLOVENT HFA) 110 MCG/ACT inhaler Inhale 2 puffs into the lungs 2 (two) times daily. 02/24/20   Rutherford Guys, MD  furosemide (LASIX) 20 MG tablet TAKE 1 TABLET ONCE DAILY. 03/13/20   Rutherford Guys, MD  guaiFENesin-dextromethorphan (ROBITUSSIN DM) 100-10 MG/5ML syrup Take 5 mLs by mouth every 8 (eight) hours as needed for cough. 02/15/20   Maximiano Coss, NP  metoprolol succinate (TOPROL-XL) 50 MG 24 hr tablet TAKE 1 TABLET DAILY WITH OR IMMEDIATELY FOLLOWING A MEAL. 03/13/20   Rutherford Guys, MD  mirtazapine (REMERON) 15 MG tablet Take 1 tablet (15 mg total) by mouth at bedtime. 02/24/20   Rutherford Guys, MD  Misc. Devices (WALKER) MISC Use walker whenever walking, standard walker Patient taking differently: 1 each by Other route See admin instructions. Use walker whenever walking, standard walker 07/27/18   Rutherford Guys, MD  montelukast (SINGULAIR) 10 MG tablet Take 1 tablet (10 mg total) by mouth at bedtime. 02/15/20   Maximiano Coss, NP  pravastatin (PRAVACHOL) 40 MG tablet TAKE 1 TABLET ONCE DAILY. 03/13/20   Rutherford Guys, MD  traZODone (DESYREL) 50 MG tablet Take 1-2 tablets (50-100 mg total) by mouth at bedtime. 11/19/19   Rutherford Guys, MD  triamcinolone (KENALOG) 0.025 % ointment Apply 1 application topically 2 (two) times daily. 11/19/19   Rutherford Guys, MD    Physical Exam: There were no vitals filed for this visit.   There were no vitals filed for this visit.  Constitutional: NAD, alert and oriented x 2 (Person and place), frail Eyes: PERRL, lids and conjunctivae normal ENMT: Mucous membranes are moist.  Neck: normal, supple, no masses, no thyromegaly Respiratory: Decreased air  entry over right lung fields, Scattered wheezing, crackles.  Tachypneic. No accessory muscle use.  Cardiovascular: Regular rate and rhythm,no murmurs / rubs / gallops. No extremity edema. 2+ pedal pulses. No carotid bruits.  Abdomen: no tenderness, no masses palpated. No hepatosplenomegaly. Bowel sounds positive.  Musculoskeletal: no clubbing / cyanosis. No joint deformity upper and lower extremities.  Skin: no rashes, lesions, ulcers.  Neurologic: No gross focal neurologic deficit. Psychiatric: Normal mood and affect.   Labs on Admission: I have personally reviewed following labs and imaging studies  CBC: No results for input(s): WBC, NEUTROABS, HGB, HCT, MCV, PLT in the last 168 hours. Basic Metabolic Panel: No results for input(s): NA, K, CL, CO2, GLUCOSE, BUN, CREATININE, CALCIUM, MG, PHOS in the last 168 hours. GFR: Estimated Creatinine Clearance: 53.4 mL/min (by C-G formula based on SCr of 0.87 mg/dL). Liver Function Tests: No results for input(s): AST, ALT, ALKPHOS, BILITOT, PROT, ALBUMIN in the last 168 hours. No results for  input(s): LIPASE, AMYLASE in the last 168 hours. No results for input(s): AMMONIA in the last 168 hours. Coagulation Profile: No results for input(s): INR, PROTIME in the last 168 hours. Cardiac Enzymes: No results for input(s): CKTOTAL, CKMB, CKMBINDEX, TROPONINI in the last 168 hours. BNP (last 3 results) No results for input(s): PROBNP in the last 8760 hours. HbA1C: No results for input(s): HGBA1C in the last 72 hours. CBG: No results for input(s): GLUCAP in the last 168 hours. Lipid Profile: No results for input(s): CHOL, HDL, LDLCALC, TRIG, CHOLHDL, LDLDIRECT in the last 72 hours. Thyroid Function Tests: No results for input(s): TSH, T4TOTAL, FREET4, T3FREE, THYROIDAB in the last 72 hours. Anemia Panel: No results for input(s): VITAMINB12, FOLATE, FERRITIN, TIBC, IRON, RETICCTPCT in the last 72 hours. Urine analysis:    Component Value  Date/Time   COLORURINE STRAW (A) 03/11/2020 0623   APPEARANCEUR CLEAR 03/11/2020 0623   LABSPEC 1.008 03/11/2020 0623   PHURINE 6.0 03/11/2020 0623   GLUCOSEU NEGATIVE 03/11/2020 0623   HGBUR NEGATIVE 03/11/2020 0623   BILIRUBINUR NEGATIVE 03/11/2020 0623   BILIRUBINUR negative 03/23/2019 1420   BILIRUBINUR neg 06/03/2014 1014   KETONESUR NEGATIVE 03/11/2020 0623   PROTEINUR NEGATIVE 03/11/2020 0623   UROBILINOGEN 1.0 03/23/2019 1420   NITRITE NEGATIVE 03/11/2020 0623   LEUKOCYTESUR NEGATIVE 03/11/2020 8101    Radiological Exams on Admission: No results found.  EKG: Independently reviewed.  Sinus rhythm Ventricular bigeminy  Assessment/Plan Principal Problem:   Pleural effusion on right Active Problems:   Essential hypertension   Chronic diastolic CHF (congestive heart failure) (HCC)     Recurrent right pleural effusion Patient presents for evaluation of worsening shortness of breath and noted to have an increased oxygen requirement He has chronic respiratory failure and on 2 to 3 L of oxygen which is his baseline requirement his pulse oximetry was in the low 80s and improved to the 90s on 4 L Maintain pulse oximetry greater than 92% Chest x-ray shows a large right pleural effusion Patient was recently hospitalized and is status post thoracentesis with drainage of 1.2 L of pleural fluid.  He was scheduled as an outpatient for repeat thoracentesis for 04/03/20 We will request interventional radiology for ultrasound-guided thoracentesis Follow-up results of fluid analysis    Acute on chronic diastolic dysfunction CHF Last known LVEF of 50 to 55% Patient presents for evaluation of worsening shortness of breath from his baseline and has an increased oxygen requirement Chest x-ray shows bilateral effusion but right greater than left Place patient on furosemide 40 mg IV daily Continue metoprolol 50 mg daily to optimize blood pressure control   Depression Continue  Remeron and trazodone   COPD with chronic respiratory failure Continue as needed bronchodilator therapy and inhaled steroids Continue oxygen supplementation to maintain pulse oximetry greater than 92%   DVT prophylaxis: SCD (Since patient is scheduled for thoracentesis) Code Status: DNR Family Communication: Greater than 50% of time was spent discussing patient's condition and plan of care with his daughter [name] over the phone, all questions and concerns have been addressed.  She verbalizes understanding and agrees with the plan. Disposition Plan: Back to previous home environment Consults called: Interventional radiology    Dafne Nield MD [facility]     04/03/2020, 4:35 PM

## 2020-03-31 NOTE — ED Notes (Signed)
I have spoken with the charge nurse on [name]  She reports that the pt will probably go to a different room  She will call back

## 2020-03-31 NOTE — Telephone Encounter (Signed)
Pt is on his way to the hospital. Pt daughter is taking him.

## 2020-03-31 NOTE — ED Provider Notes (Signed)
[facility] EMERGENCY DEPARTMENT Provider Note   CSN: [id number] Arrival date & time: 04/26/2020  1325     History No chief complaint on file.   [name] is a 86 y.o. male.  Patient is an 86 year old male with history of congestive heart failure and recent admission for pneumonia with right-sided pleural effusion.  This required thoracentesis.  Patient discharged home on oxygen.  He presents today with worsening breathing, cough, and congestion.  He denies fevers or chills.  He denies chest pain or leg swelling.  The history is provided by the patient.       Past Medical History:  Diagnosis Date  . Allergy    seasonal  fall  . Asthma    as child  . Cancer ([place])    Basal cell carcinoma; multiple  . Chronic diastolic CHF (congestive heart failure) ([place]) 03/11/2020  . Dyspnea   . Eczema   . Hyperlipidemia   . Hypertension   . MSSA bacteremia 2020  . Osteoporosis   . Pleural effusion due to CHF (congestive heart failure) ([place])   . Stroke [facility])     Patient Active Problem List   Diagnosis Date Noted  . Chronic diastolic CHF (congestive heart failure) ([place]) 03/11/2020  . Pleural effusion on right 03/11/2020  . Shortness of breath 03/11/2020  . Mixed hyperlipidemia 03/11/2020  . Pneumonia 03/11/2020  . MSSA bacteremia 07/15/2019  . Hypoxia   . Peripheral edema   . Thyroid nodule   . Acute respiratory failure with hypoxia ([place]) 07/14/2019  . HCAP (healthcare-associated pneumonia) 07/14/2019  . Thyroid mass 07/14/2019  . Diaphragm paralysis 05/04/2019  . Restrictive lung disease 05/04/2019  . Altered mental status 04/30/2019  . Confusion 04/30/2019  . Abnormal weight loss 03/23/2019  . Pain in joint of left shoulder 03/23/2019  . Frequent urination 03/23/2019  . Constipation 03/23/2019  . TIA (transient ischemic attack) 03/08/2019  . Hyponatremia 03/08/2019  . Impaired mobility 01/13/2019  . Seasonal allergies 01/13/2019  . Hiatal hernia  01/13/2019  . Pain in right hip 12/24/2018  . S/P right hip fracture 11/11/2018  . Recurrent falls 05/12/2018  . History of CVA (cerebrovascular accident) 01/16/2018  . Nocturnal enuresis   . Fall   . Full incontinence of feces   . Essential hypertension   . Hypoalbuminemia due to protein-calorie malnutrition ([place])   . Abnormality of gait   . Leg edema   . Hip fracture ([place]) 03/30/2017  . Leukocytosis 03/29/2017  . Mild intermittent asthma without complication 95/28/4132  . Chronic renal insufficiency 09/22/2015  . Tachycardia 09/22/2015  . PVC (premature ventricular contraction) 09/22/2015  . Osteoporosis 06/03/2014  . Basal cell carcinoma of face 06/03/2014  . Benign prostatic hyperplasia 05/27/2013  . Eczema 04/23/2012  . Dyslipidemia 04/23/2012    Past Surgical History:  Procedure Laterality Date  . CATARACT EXTRACTION, BILATERAL  06/29/2015  . INTRAMEDULLARY (IM) NAIL INTERTROCHANTERIC Left 03/30/2017   Procedure: INTRAMEDULLARY (IM) NAIL INTERTROCHANTRIC;  Surgeon: [name], MD;  Location: [place];  Service: Orthopedics;  Laterality: Left;  . INTRAMEDULLARY (IM) NAIL INTERTROCHANTERIC Right 11/12/2018   Procedure: INTRAMEDULLARY (IM) NAIL RIGHT INTERTROCHANTERIC HIP FRACTURE;  Surgeon: [name], MD;  Location: [place];  Service: Orthopedics;  Laterality: Right;  . MOHS SURGERY    . TEE WITHOUT CARDIOVERSION N/A 07/20/2019   Procedure: TRANSESOPHAGEAL ECHOCARDIOGRAM (TEE);  Surgeon: [name], MD;  Location: [facility] ENDOSCOPY;  Service: Cardiovascular;  Laterality: N/A;  . TONSILLECTOMY  1940 maybe  Family History  Problem Relation Age of Onset  . Arthritis Mother   . Heart disease Mother 60  . Hypertension Father     Social History   Tobacco Use  . Smoking status: Never Smoker  . Smokeless tobacco: Never Used  Substance Use Topics  . Alcohol use: No    Comment: occas  . Drug use: No    Home Medications Prior to Admission medications    Medication Sig Start Date End Date Taking? Authorizing Provider  acetaminophen (TYLENOL) 500 MG tablet Take 500-1,000 mg by mouth as needed for mild pain or headache (pain from eczema).     [provider]  albuterol (PROAIR HFA) 108 (90 Base) MCG/ACT inhaler Inhale 1-2 puffs into the lungs every 6 (six) hours as needed for wheezing or shortness of breath. 11/19/19   Rutherford Guys, MD  alendronate (FOSAMAX) 70 MG tablet TAKE 1 TABLET EVERY 7 DAYS. TAKE WITH A FULL GLASS OF WATER ON AN EMPTY STOMACH. 03/13/20   Rutherford Guys, MD  Blood Pressure KIT Please check BP twice a day Patient taking differently: 1 each by Other route in the morning and at bedtime.  03/25/19   Rutherford Guys, MD  fluticasone (FLOVENT HFA) 110 MCG/ACT inhaler Inhale 2 puffs into the lungs 2 (two) times daily. 02/24/20   Rutherford Guys, MD  furosemide (LASIX) 20 MG tablet TAKE 1 TABLET ONCE DAILY. 03/13/20   Rutherford Guys, MD  guaiFENesin-dextromethorphan (ROBITUSSIN DM) 100-10 MG/5ML syrup Take 5 mLs by mouth every 8 (eight) hours as needed for cough. 02/15/20   Maximiano Coss, NP  metoprolol succinate (TOPROL-XL) 50 MG 24 hr tablet TAKE 1 TABLET DAILY WITH OR IMMEDIATELY FOLLOWING A MEAL. 03/13/20   Rutherford Guys, MD  mirtazapine (REMERON) 15 MG tablet Take 1 tablet (15 mg total) by mouth at bedtime. 02/24/20   Rutherford Guys, MD  Misc. Devices (WALKER) MISC Use walker whenever walking, standard walker Patient taking differently: 1 each by Other route See admin instructions. Use walker whenever walking, standard walker 07/27/18   Rutherford Guys, MD  montelukast (SINGULAIR) 10 MG tablet Take 1 tablet (10 mg total) by mouth at bedtime. 02/15/20   Maximiano Coss, NP  pravastatin (PRAVACHOL) 40 MG tablet TAKE 1 TABLET ONCE DAILY. 03/13/20   Rutherford Guys, MD  traZODone (DESYREL) 50 MG tablet Take 1-2 tablets (50-100 mg total) by mouth at bedtime. 11/19/19   Rutherford Guys, MD  triamcinolone (KENALOG) 0.025  % ointment Apply 1 application topically 2 (two) times daily. 11/19/19   Rutherford Guys, MD    Allergies    Other  Review of Systems   Review of Systems  All other systems reviewed and are negative.   Physical Exam Updated Vital Signs There were no vitals taken for this visit.  Physical Exam Vitals and nursing note reviewed.  Constitutional:      General: He is not in acute distress.    Appearance: He is well-developed. He is not diaphoretic.  HENT:     Head: Normocephalic and atraumatic.  Cardiovascular:     Rate and Rhythm: Normal rate and regular rhythm.     Heart sounds: No murmur. No friction rub.  Pulmonary:     Effort: Pulmonary effort is normal. No respiratory distress.     Breath sounds: Rales present. No wheezing.     Comments: Breath sounds are asymmetrical.  They are distant on the right and there are audible rales on  the left. Abdominal:     General: Bowel sounds are normal. There is no distension.     Palpations: Abdomen is soft.     Tenderness: There is no abdominal tenderness.  Musculoskeletal:        General: No swelling or tenderness. Normal range of motion.     Cervical back: Normal range of motion and neck supple.     Right lower leg: No edema.     Left lower leg: No edema.  Skin:    General: Skin is warm and dry.  Neurological:     Mental Status: He is alert and oriented to person, place, and time.     Coordination: Coordination normal.     ED Results / Procedures / Treatments   Labs (all labs ordered are listed, but only abnormal results are displayed) Labs Reviewed - No data to display  EKG None  Radiology No results found.  Procedures Procedures (including critical care time)  Medications Ordered in ED Medications - No data to display  ED Course  I have reviewed the triage vital signs and the nursing notes.  Pertinent labs & imaging results that were available during my care of the patient were reviewed by me and considered in  my medical decision making (see chart for details).    MDM Rules/Calculators/A&P  Patient presenting here with complaints of shortness of breath and increased oxygen requirement.  Patient normally on 2 L of oxygen at home, however desaturates to the mid 70s while in the ER on 2 L.  His oxygen was increased to 4 L nasal cannula and he is now maintaining in the mid 90s.  I feel as the patient will require admission and likely thoracentesis.  I have discussed the case with the hospitalist who will admit.  Final Clinical Impression(s) / ED Diagnoses Final diagnoses:  Pleural effusion    Rx / DC Orders ED Discharge Orders    None       Veryl Speak, MD 04/08/2020 1625

## 2020-03-31 NOTE — ED Notes (Signed)
Report called to rn on 3e 

## 2020-04-01 ENCOUNTER — Inpatient Hospital Stay (HOSPITAL_COMMUNITY): Payer: Medicare HMO

## 2020-04-01 ENCOUNTER — Encounter (HOSPITAL_COMMUNITY): Payer: Self-pay | Admitting: Internal Medicine

## 2020-04-01 ENCOUNTER — Other Ambulatory Visit: Payer: Self-pay

## 2020-04-01 LAB — SARS CORONAVIRUS 2 BY RT PCR (HOSPITAL ORDER, PERFORMED IN ~~LOC~~ HOSPITAL LAB): SARS Coronavirus 2: NEGATIVE

## 2020-04-01 LAB — BODY FLUID CELL COUNT WITH DIFFERENTIAL
Eos, Fluid: 3 %
Lymphs, Fluid: 93 %
Monocyte-Macrophage-Serous Fluid: 1 % — ABNORMAL LOW (ref 50–90)
Neutrophil Count, Fluid: 3 % (ref 0–25)
Total Nucleated Cell Count, Fluid: 3926 cu mm — ABNORMAL HIGH (ref 0–1000)

## 2020-04-01 LAB — BASIC METABOLIC PANEL
Anion gap: 10 (ref 5–15)
BUN: 12 mg/dL (ref 8–23)
CO2: 38 mmol/L — ABNORMAL HIGH (ref 22–32)
Calcium: 8.7 mg/dL — ABNORMAL LOW (ref 8.9–10.3)
Chloride: 95 mmol/L — ABNORMAL LOW (ref 98–111)
Creatinine, Ser: 1.04 mg/dL (ref 0.61–1.24)
GFR calc Af Amer: >60 mL/min (ref >60)
GFR calc non Af Amer: >60 mL/min (ref >60)
Glucose, Bld: 93 mg/dL (ref 70–99)
Potassium: 3.6 mmol/L (ref 3.5–5.1)
Sodium: 143 mmol/L (ref 135–145)

## 2020-04-01 LAB — GLUCOSE, PLEURAL OR PERITONEAL FLUID: Glucose, Fluid: 82 mg/dL

## 2020-04-01 LAB — AMYLASE, PLEURAL OR PERITONEAL FLUID: Amylase, Fluid: 50 U/L

## 2020-04-01 LAB — LACTATE DEHYDROGENASE, PLEURAL OR PERITONEAL FLUID: LD, Fluid: 249 U/L — ABNORMAL HIGH (ref 3–23)

## 2020-04-01 LAB — CBC
HCT: 41.3 % (ref 39.0–52.0)
Hemoglobin: 13.2 g/dL (ref 13.0–17.0)
MCH: 32 pg (ref 26.0–34.0)
MCHC: 32 g/dL (ref 30.0–36.0)
MCV: 100.2 fL — ABNORMAL HIGH (ref 80.0–100.0)
Platelets: 252 10*3/uL (ref 150–400)
RBC: 4.12 MIL/uL — ABNORMAL LOW (ref 4.22–5.81)
RDW: 12 % (ref 11.5–15.5)
WBC: 8.2 10*3/uL (ref 4.0–10.5)
nRBC: 0 % (ref 0.0–0.2)

## 2020-04-01 LAB — LACTATE DEHYDROGENASE: LDH: 156 U/L (ref 98–192)

## 2020-04-01 LAB — PROTEIN, PLEURAL OR PERITONEAL FLUID: Total protein, fluid: 4.9 g/dL

## 2020-04-01 MED ORDER — LIDOCAINE HCL (PF) 1 % IJ SOLN
INTRAMUSCULAR | Status: AC
  Filled 2020-04-01: qty 30

## 2020-04-01 MED ORDER — ORAL CARE MOUTH RINSE
15.0000 mL | Freq: Two times a day (BID) | OROMUCOSAL | Status: DC
  Administered 2020-04-01 – 2020-04-04 (×7): 15 mL via OROMUCOSAL

## 2020-04-01 NOTE — Progress Notes (Signed)
SLP Cancellation Note  Patient Details Name: [name] MRN: [id number] DOB: February 04, 1932   Cancelled treatment:        Attempted to see pt for clinical swallowing evalution.  Pt transporting to [place] for thoracenteses at time of attempt.  Current diet (Dys2/NTL) is consistent with recommendations from 09/22/2019 MBSS. SLP will reattempt as schedule permits.   Celedonio Savage, [place], [place] Office: [phone]  04/01/2020, 12:38 PM

## 2020-04-01 NOTE — Progress Notes (Signed)
[facility] Progress Note  Patient: [name]    TMH:[id number]  DOA: 04/03/2020     Date of Service: the patient was seen and examined on 04/01/2020  Chief complaint. Shortness of breath.  Brief hospital course: Past medical history of asthma, hypertension, diastolic dysfunction, MSSA bacteremia, previous stroke, chronic hypoxic respiratory failure and aspiration. Patient presents with complaints of recurrent right pleural effusion with shortness of breath.  SP thoracentesis on 04/01/2020 with 1.2 L fluid removed.  Analysis shows exudative fluid. Currently plan is monitor closely and follow-up on the results of the culture and cytology of the pleural fluid as well as continue to engage with family regarding goals of care..  Assessment and Plan: 1.  Recurrent right pleural effusion Recent aspiration pneumonia Follow-up x-ray after thoracentesis still shows some pleural effusion. Last admission patient had a follow-up CT scan which did not show any evidence of malignancy. Analysis of the fluid showed exudative fluid. Fluid was dark blood-tinged this time. The frequency at which the patient is having recurrent pleural effusion is concerning for a malignant process although cytology was negative last time. Follow-up on cytology at this time. Patient has significant weight loss. Currently no indication to continue antibiotics therefore will monitor off of the antibiotics.  2.  Dysphagia Recent aspiration pneumonia Patient has severe dysphagia and should be on thickened liquids at a minimum but has been drinking thin liquids since diagnosed. Patient would not accept thickened liquids. Discussed with daughter at bedside regarding aspiration being mechanical problem and for chronic dysphagia lack of ability of diet modification preventing somebody from having aspiration and eventual recurrent pneumonia which might be the cause of patient's current presentation. All this poses a significant  risk to patient's prognosis and recurrence of it does frequently certainly poses prognosis less than 6 months. Speech therapy consulted. Modified barium swallow tomorrow to see if there are any modality or techniques that can help to reduce aspiration while drinking thin liquids.  3.  Goals of care Patient has recurrent right pleural effusion. Patient has severe aspiration risk and is unable to tolerate any degree of diet thickness without aspiration. Patient does not appear to have significant reserves. Patient does not want to modify his liquid intake. Patient has lost 10 pounds weight in the last few weeks. Appears debilitated and currently lethargic as well. Patient has significant recurrent pleural effusion requiring 2 thoracentesis 3 weeks apart yielding more than a liter of fluid which is exudative in nature. At present concern is that the patient is progressively declining and at risk for poor outcomes in neck 6 months. Patient already following up with [facility] and has a MOST form which dictates DNR/DNI and consider treatment if it is appropriate for medical conditions. Explained to daughter and the patient diet given that aspiration is a real risk patient remains at risk for developing pneumonia which is a medical condition which will lead to further decline in health. Recommend transition to comfort care and hospice at the time of discharge. Patient will also benefit from a Pleurx catheter given the size of the effusion and recurrence of it. We will perform repeat chest x-ray tomorrow. Palliative care consulted.  4.  Chronic diastolic CHF Essential hypertension EF 50 to 55%. Patient was given IV Lasix. We will monitor. Continue metoprolol.  5.  History of depression Poor p.o. intake and anorexia Continue home regimen.  6.  History of COPD with chronic respiratory failure hypoxic Continue oxygen continue nebulizers  Body mass index is 20.74 kg/m.  Nutrition Problem:  Inadequate oral intake Etiology: acute illness, chronic illness(recurrent pleural effusion, chronic hypoxic respiratory failure on 2-3 L at baseline) Interventions: Interventions: Refer to RD note for recommendations      Diet: Dysphagia 2 diet DVT Prophylaxis: Subcutaneous Heparin    Advance goals of care discussion: DNR  Family Communication: family was present at bedside, at the time of interview.  The pt provided permission to discuss medical plan with the family. Opportunity was given to ask question and all questions were answered satisfactorily.   Disposition:  Current level of care for the pt is Inpatient on [facility] @ unit. Remains inpatient appropriate because:Ongoing diagnostic testing needed not appropriate for outpatient work up  Dispo: The patient is from: Home              Anticipated d/c is to: Home              Anticipated d/c date is: 2 days              Patient currently is not medically stable to d/c.  Subjective: Continues to have shortness of breath no nausea no vomiting no fever no chills.  No chest pain.  Physical Exam:  General: Appear in mild distress, no Rash; Oral Mucosa Clear, moist. no Abnormal Neck Mass Or lumps, Conjunctiva normal  Cardiovascular: S1 and S2 Present, aortic systolic  Murmur, Respiratory: increased respiratory effort, Bilateral Air entry present and bilateral  Crackles, no wheezes Abdomen: Bowel Sound present, Soft and no tenderness Extremities: no Pedal edema, no calf tenderness Neurology: alert and oriented to time, place, and person affect flat in affect. no new focal deficit Gait not checked due to patient safety concerns  Vitals:   04/01/20 0821 04/01/20 1310 04/01/20 1612 04/01/20 1745  BP:  (!) 114/49 102/66   Pulse:   (!) 104   Resp:   16   Temp:   98.8 F (37.1 C)   TempSrc:      SpO2: 99%  96%   Weight:      Height:    5\' 6"  (1.676 m)    Intake/Output Summary (Last 24 hours) at 04/01/2020 1901 Last data filed  at 04/01/2020 1221 Gross per 24 hour  Intake --  Output 800 ml  Net -800 ml   Filed Weights   04/23/2020 2112  Weight: 58.3 kg    Data Reviewed: I have personally reviewed and interpreted daily labs, tele strips, imagings as discussed above. I reviewed all nursing notes, pharmacy notes, vitals, pertinent old records I have discussed plan of care as described above with RN and patient/family.  CBC: Recent Labs  Lab 04/01/20 0346  WBC 8.2  HGB 13.2  HCT 41.3  MCV 100.2*  PLT 784   Basic Metabolic Panel: Recent Labs  Lab 04/01/20 0346  NA 143  K 3.6  CL 95*  CO2 38*  GLUCOSE 93  BUN 12  CREATININE 1.04  CALCIUM 8.7*    Studies: DG Chest 1 View  Result Date: 04/01/2020 CLINICAL DATA:  Status post RIGHT thoracentesis. EXAM: CHEST  1 VIEW COMPARISON:  04/01/2020 FINDINGS: Smaller RIGHT pleural effusion. No evidence for pneumothorax. Improved aeration in the RIGHT lung. There is persistent opacity in the MEDIAL LEFT lung base, consistent with atelectasis or infiltrate. Small LEFT pleural effusion. IMPRESSION: 1. Smaller RIGHT pleural effusion. No pneumothorax. 2. Improved aeration in the RIGHT lung. Electronically Signed   By: Nolon Nations M.D.   On: 04/01/2020 14:01   DG CHEST PORT 1  VIEW  Result Date: 04/01/2020 CLINICAL DATA:  Worsening right-sided aeration. EXAM: PORTABLE CHEST 1 VIEW COMPARISON:  Chest radiograph 03/28/2020 FINDINGS: Monitoring leads overlie the patient. Stable enlarged cardiac and mediastinal contours. Interval increase in size of moderate to large right pleural effusion with underlying consolidation. Persistent small left pleural effusion. No definite pneumothorax. Thoracic spine degenerative changes. IMPRESSION: Interval increase in size of moderate to large right pleural effusion with underlying consolidation. Electronically Signed   By: Lovey Newcomer M.D.   On: 04/01/2020 14:37   US THORACENTESIS ASP PLEURAL SPACE W/IMG GUIDE  Result Date:  04/01/2020 INDICATION: Patient with history of asthma, diastolic HF, chronic hypoxic respiratory failure, dyspnea, and recurrent right pleural effusion. Request made for diagnostic and therapeutic right thoracentesis. EXAM: ULTRASOUND GUIDED DIAGNOSTIC AND THERAPEUTIC RIGHT THORACENTESIS MEDICATIONS: 15 mL 1% lidocaine COMPLICATIONS: None immediate. PROCEDURE: An ultrasound guided thoracentesis was thoroughly discussed with the patient and questions answered. The benefits, risks, alternatives and complications were also discussed. The patient understands and wishes to proceed with the procedure. Written consent was obtained. Ultrasound was performed to localize and mark an adequate pocket of fluid in the right chest. The area was then prepped and draped in the normal sterile fashion. 1% Lidocaine was used for local anesthesia. Under ultrasound guidance a 6 Fr Safe-T-Centesis catheter was introduced. Thoracentesis was performed. The catheter was removed and a dressing applied. FINDINGS: A total of approximately 1.25 L of dark red fluid was removed. Samples were sent to the laboratory as requested by the clinical team. IMPRESSION: Successful ultrasound guided right thoracentesis yielding 1.25 L of pleural fluid. Read by: Earley Abide, PA-C No pneumothorax on follow-up radiography. Electronically Signed   By: Lucrezia Europe M.D.   On: 04/01/2020 14:04    Scheduled Meds: . budesonide  0.25 mg Inhalation BID  . feeding supplement (ENSURE ENLIVE)  237 mL Oral BID BM  . furosemide  40 mg Intravenous Daily  . lidocaine (PF)      . mouth rinse  15 mL Mouth Rinse BID  . metoprolol succinate  50 mg Oral Daily  . mirtazapine  15 mg Oral QHS  . montelukast  10 mg Oral QHS  . pravastatin  40 mg Oral Daily  . sodium chloride flush  3 mL Intravenous Q12H  . traZODone  50-100 mg Oral QHS   Continuous Infusions: . sodium chloride     PRN Meds: sodium chloride, acetaminophen, ipratropium-albuterol, ondansetron **OR**  ondansetron (ZOFRAN) IV, sodium chloride flush  Time spent: 30 minutes for initial evaluation  40 minutes spent between 04:00pm and 04:40PM for discussion of goals of care.   Author: Berle Mull, MD [facility] 04/01/2020 7:01 PM  To reach On-call, see care teams to locate the attending and reach out via [email or web address]. Between 7PM-7AM, please contact night-coverage If you still have difficulty reaching the attending provider, please page the [facility] (Director on Call) for [facility] on amion for assistance.

## 2020-04-01 NOTE — Progress Notes (Signed)
Initial Nutrition Assessment  DOCUMENTATION CODES:   Not applicable  INTERVENTION:  Provide nutrition supplements as appropriate with diet advancment   NUTRITION DIAGNOSIS:   Inadequate oral intake related to acute illness, chronic illness(recurrent pleural effusion, chronic hypoxic respiratory failure on 2-3 L at baseline) as evidenced by NPO status.   GOAL:   Patient will meet greater than or equal to 90% of their needs    MONITOR:   Weight trends, Diet advancement, I & O's, Labs  REASON FOR ASSESSMENT:   Malnutrition Screening Tool    ASSESSMENT:  RD working remotely.  86 year old admitted with right pleural effusion after presenting with increased need for oxygen, labored breathing, and audible wheezing. Past medical history significant for asthma, HTN, diastolic dysfunction, previous stroke, chronic hypoxic failure on 2-3 L oxygen at home and recently hospitalized and is s/p thoracentesis for right pleural effusion.  CXR showed large right pleural effusion, asymmetric right-sided edema, and small left pleural effusion. He is s/p US thoracentesis, 1.25 L of dark red fluid removed and currently NPO. Patient is pending swallow evaluation, will follow for SLP recommendations as well as diet advancement and will provide nutrition supplements as appropriate.  Current wt 128.26 lb Weights have fluctuated over the past 8 months. Per history, on 08/24/19 he weighed 149.6 lb, on 09/10/19 pt weighed 135.74 lb, on 10/05/19 pt weighed 140.14 lb, on 11/19/19 pt weighed 136.62 lb, on 01/31/20 weights increased to 149.6 lb, on 02/15/20 wt decreased to 136.18 lb, on 03/10/20 he weighed 139.7 lb, and on 03/24/20 pt weighed 136.18 lb. Suspect weight trends likely related to fluid shifts given recurrent pleural effusions and chronic diastolic CHF.  Medications reviewed and include: Lasix IV 40 mg daily, Remeron Labs reviewed  NUTRITION - FOCUSED PHYSICAL EXAM: Unable to complete at this time,  RD working remotely.  Diet Order:   Diet Order            Diet NPO time specified Except for: Ice Chips, Sips with Meds  Diet effective now              EDUCATION NEEDS:   No education needs have been identified at this time  Skin:  Skin Assessment: Reviewed RN Assessment  Last BM:  6/5  Height:   Ht Readings from Last 1 Encounters:  03/24/20 5' 6.5" (1.689 m)    Weight:   Wt Readings from Last 1 Encounters:  04/12/2020 58.3 kg    BMI:  Body mass index is 20.43 kg/m.  Estimated Nutritional Needs:   Kcal:  2025-4270  Protein:  70-80  Fluid:  >/= 1.4 L/day  Lajuan Lines, RD, LDN Clinical Nutrition After Hours/Weekend Pager # in [place]

## 2020-04-01 NOTE — Evaluation (Signed)
Clinical/Bedside Swallow Evaluation Patient Details  Name: [name] MRN: [id number] Date of Birth: October 10, 1932  Today's Date: 04/01/2020 Time: SLP Start Time (ACUTE ONLY): 39 SLP Stop Time (ACUTE ONLY): 1600 SLP Time Calculation (min) (ACUTE ONLY): 50 min  Past Medical History:  Past Medical History:  Diagnosis Date  . Allergy    seasonal  fall  . Asthma    as child  . Cancer ([place])    Basal cell carcinoma; multiple  . Chronic diastolic CHF (congestive heart failure) ([place]) 03/11/2020  . Dyspnea   . Eczema   . Hyperlipidemia   . Hypertension   . MSSA bacteremia 2020  . Osteoporosis   . Pleural effusion due to CHF (congestive heart failure) ([place])   . Stroke [facility])    Past Surgical History:  Past Surgical History:  Procedure Laterality Date  . CATARACT EXTRACTION, BILATERAL  06/29/2015  . INTRAMEDULLARY (IM) NAIL INTERTROCHANTERIC Left 03/30/2017   Procedure: INTRAMEDULLARY (IM) NAIL INTERTROCHANTRIC;  Surgeon: [name], MD;  Location: [place];  Service: Orthopedics;  Laterality: Left;  . INTRAMEDULLARY (IM) NAIL INTERTROCHANTERIC Right 11/12/2018   Procedure: INTRAMEDULLARY (IM) NAIL RIGHT INTERTROCHANTERIC HIP FRACTURE;  Surgeon: [name], MD;  Location: [place];  Service: Orthopedics;  Laterality: Right;  . MOHS SURGERY    . TEE WITHOUT CARDIOVERSION N/A 07/20/2019   Procedure: TRANSESOPHAGEAL ECHOCARDIOGRAM (TEE);  Surgeon: [name], MD;  Location: [facility] ENDOSCOPY;  Service: Cardiovascular;  Laterality: N/A;  . TONSILLECTOMY  1940 maybe   HPI:  [name] is a 86 y.o. male with medical history significant for asthma, hypertension, diastolic dysfunction, MSSA bacteremia thought secondary to eczema treated in 06/2019, previous stroke, chronic hypoxic failure on 2 to 3 L of oxygen at home and debility who presented to the emergency department via EMS for evaluation of hypoxia.  Patient noted to have labored breathing as well as audible wheezes.  Patient was recently  hospitalized and is status post thoracentesis for right pleural effusion.  Patient was scheduled for another paracentesis on Monday 04/03/20.  MBSS in 09/21/20 revealed moderate dysphagia with recommendations for chopped/ground diet and nectar thick liquids.  Pt has been consuming thin liquid at home as he finds thickened consistencies unpalatable.   Assessment / Plan / Recommendation Clinical Impression  Pt presents with clinical indicators of pharyngeal dysphagia.  There was wet vocal quality and coughing following trials of thin liquid.  Pt tolerated puree and regular solid without overt s/s of aspiration.  Pt was reluctant to accept trials initially, but benefited from encouragement of family.  Daughter reports pt eats what he wants but that most things are cut into small pieces and he tends to consume more ground meats (hamburgers, meatloaf).  Pt does not tolerate thickened liquids and has been consuming thin liquid.  Pt has continued with dysphagia exercises, although he states they make his throat hurt.  MD arrived during session and discussion around goals of care initiated.  Discussed role of SLP and management of dysphagia  Pt would not like a modified diet, and dysphagia exercises do not appear to have been especially beneficial, but there may be some benefit of assessing compensatory strategies under fluoro to determine if there is a safer way to consume thin liquids. Discussed benefit of frequent oral care (before and after PO) to reduce risk of aspiration pneumonia given known risk of PO intake.  Overall, prognosis is somewhat guarded and it appears pt is entering end of life.   Will place orders  for dysphagia 2 (chopped) diet with thin liquids per discussion with family and MD.   MBSS to further assess pharyngeal swallow function planned for next date.  SLP Visit Diagnosis: Dysphagia, oropharyngeal phase (R13.12)    Aspiration Risk  Moderate aspiration risk;Severe aspiration risk    Diet  Recommendation Dysphagia 2 (Fine chop);Thin liquid   Liquid Administration via: Cup;No straw Medication Administration: Crushed with puree Supervision: Staff to assist with self feeding Compensations: Slow rate;Small sips/bites Postural Changes: Seated upright at 90 degrees    Other  Recommendations Oral Care Recommendations: Oral care before and after PO   Follow up Recommendations (TBD)      Frequency and Duration (TBD)          Prognosis   guarded     Swallow Study   General HPI: [name] is a 86 y.o. male with medical history significant for asthma, hypertension, diastolic dysfunction, MSSA bacteremia thought secondary to eczema treated in 06/2019, previous stroke, chronic hypoxic failure on 2 to 3 L of oxygen at home and debility who presented to the emergency department via EMS for evaluation of hypoxia.  Patient noted to have labored breathing as well as audible wheezes.  Patient was recently hospitalized and is status post thoracentesis for right pleural effusion.  Patient was scheduled for another paracentesis on Monday 04/03/20.  MBSS in 09/21/20 revealed moderate dysphagia with recommendations for chopped/ground diet and nectar thick liquids.  Pt has been consuming thin liquid at home as he finds thickened consistencies unpalatable. Type of Study: Bedside Swallow Evaluation Diet Prior to this Study: NPO Temperature Spikes Noted: No Respiratory Status: Nasal cannula History of Recent Intubation: No Behavior/Cognition: Alert;Cooperative;Pleasant mood Oral Cavity Assessment: Within Functional Limits Self-Feeding Abilities: Needs assist Patient Positioning: Upright in bed Baseline Vocal Quality: Wet;Hoarse;Low vocal intensity    Oral/Motor/Sensory Function     Ice Chips Ice chips: Not tested   Thin Liquid Thin Liquid: Impaired Presentation: Cup Pharyngeal  Phase Impairments: Suspected delayed Swallow;Decreased hyoid-laryngeal movement;Wet Vocal Quality;Throat Clearing -  Immediate;Cough - Immediate(watery eyes)    Nectar Thick Nectar Thick Liquid: Not tested   Honey Thick Honey Thick Liquid: Not tested   Puree Puree: Within functional limits Presentation: Spoon   Solid     Solid: Within functional limits Presentation: (SLP fed)      [name], [place], [place] Office: [phone] 04/01/2020,4:31 PM

## 2020-04-01 NOTE — Procedures (Signed)
PROCEDURE SUMMARY:  Successful image-guided right thoracentesis. Yielded 1.25 liters of dark red fluid. Patient tolerated procedure well. No immediate complications. EBL < 2 mL.  Specimen was sent for labs. CXR ordered.  Please see imaging section of Epic for full dictation.   Claris Pong Maxyne Derocher PA-C 04/01/2020 1:29 PM

## 2020-04-02 ENCOUNTER — Inpatient Hospital Stay (HOSPITAL_COMMUNITY): Payer: Medicare HMO

## 2020-04-02 DIAGNOSIS — Z515 Encounter for palliative care: Secondary | ICD-10-CM

## 2020-04-02 DIAGNOSIS — Z66 Do not resuscitate: Secondary | ICD-10-CM | POA: Diagnosis present

## 2020-04-02 DIAGNOSIS — R1319 Other dysphagia: Secondary | ICD-10-CM | POA: Diagnosis present

## 2020-04-02 DIAGNOSIS — J90 Pleural effusion, not elsewhere classified: Secondary | ICD-10-CM | POA: Diagnosis present

## 2020-04-02 DIAGNOSIS — R54 Age-related physical debility: Secondary | ICD-10-CM | POA: Diagnosis present

## 2020-04-02 DIAGNOSIS — Z7189 Other specified counseling: Secondary | ICD-10-CM

## 2020-04-02 LAB — CBC WITH DIFFERENTIAL/PLATELET
Abs Immature Granulocytes: 0.07 K/uL (ref 0.00–0.07)
Basophils Absolute: 0.1 K/uL (ref 0.0–0.1)
Basophils Relative: 0 %
Eosinophils Absolute: 0.2 K/uL (ref 0.0–0.5)
Eosinophils Relative: 1 %
HCT: 43.5 % (ref 39.0–52.0)
Hemoglobin: 14.2 g/dL (ref 13.0–17.0)
Immature Granulocytes: 1 %
Lymphocytes Relative: 5 %
Lymphs Abs: 0.7 K/uL (ref 0.7–4.0)
MCH: 31.4 pg (ref 26.0–34.0)
MCHC: 32.6 g/dL (ref 30.0–36.0)
MCV: 96.2 fL (ref 80.0–100.0)
Monocytes Absolute: 1.1 K/uL — ABNORMAL HIGH (ref 0.1–1.0)
Monocytes Relative: 7 %
Neutro Abs: 12.7 K/uL — ABNORMAL HIGH (ref 1.7–7.7)
Neutrophils Relative %: 86 %
Platelets: 213 K/uL (ref 150–400)
RBC: 4.52 MIL/uL (ref 4.22–5.81)
RDW: 11.9 % (ref 11.5–15.5)
WBC: 14.7 K/uL — ABNORMAL HIGH (ref 4.0–10.5)
nRBC: 0 % (ref 0.0–0.2)

## 2020-04-02 LAB — COMPREHENSIVE METABOLIC PANEL WITH GFR
ALT: 10 U/L (ref 0–44)
AST: 22 U/L (ref 15–41)
Albumin: 2.5 g/dL — ABNORMAL LOW (ref 3.5–5.0)
Alkaline Phosphatase: 72 U/L (ref 38–126)
Anion gap: 11 (ref 5–15)
BUN: 14 mg/dL (ref 8–23)
CO2: 36 mmol/L — ABNORMAL HIGH (ref 22–32)
Calcium: 8.7 mg/dL — ABNORMAL LOW (ref 8.9–10.3)
Chloride: 92 mmol/L — ABNORMAL LOW (ref 98–111)
Creatinine, Ser: 0.96 mg/dL (ref 0.61–1.24)
GFR calc Af Amer: >60 mL/min
GFR calc non Af Amer: >60 mL/min
Glucose, Bld: 130 mg/dL — ABNORMAL HIGH (ref 70–99)
Potassium: 4.1 mmol/L (ref 3.5–5.1)
Sodium: 139 mmol/L (ref 135–145)
Total Bilirubin: 1.1 mg/dL (ref 0.3–1.2)
Total Protein: 7.1 g/dL (ref 6.5–8.1)

## 2020-04-02 LAB — GRAM STAIN

## 2020-04-02 MED ORDER — SENNOSIDES-DOCUSATE SODIUM 8.6-50 MG PO TABS
1.0000 | ORAL_TABLET | Freq: Two times a day (BID) | ORAL | Status: DC
  Administered 2020-04-02 (×2): 1 via ORAL
  Filled 2020-04-02 (×3): qty 1

## 2020-04-02 MED ORDER — FUROSEMIDE 10 MG/ML IJ SOLN
20.0000 mg | Freq: Every day | INTRAMUSCULAR | Status: DC
  Administered 2020-04-02: 20 mg via INTRAVENOUS
  Filled 2020-04-02 (×2): qty 2

## 2020-04-02 MED ORDER — MORPHINE SULFATE (CONCENTRATE) 10 MG/0.5ML PO SOLN
5.0000 mg | ORAL | Status: DC | PRN
  Administered 2020-04-02 – 2020-04-03 (×2): 5 mg via SUBLINGUAL
  Filled 2020-04-02 (×3): qty 0.5

## 2020-04-02 MED ORDER — LORAZEPAM 2 MG/ML IJ SOLN: 1.0000 mg | INTRAMUSCULAR | Status: DC | PRN

## 2020-04-02 MED ORDER — AMOXICILLIN-POT CLAVULANATE 500-125 MG PO TABS
1.0000 | ORAL_TABLET | Freq: Three times a day (TID) | ORAL | Status: DC
  Administered 2020-04-02 (×2): 500 mg via ORAL
  Filled 2020-04-02 (×4): qty 1

## 2020-04-02 MED ORDER — HALOPERIDOL 0.5 MG PO TABS: 0.5000 mg | ORAL_TABLET | ORAL | Status: DC | PRN

## 2020-04-02 MED ORDER — LORAZEPAM 1 MG PO TABS: 1.0000 mg | ORAL_TABLET | ORAL | Status: DC | PRN

## 2020-04-02 MED ORDER — MORPHINE SULFATE (CONCENTRATE) 10 MG/0.5ML PO SOLN: 5.0000 mg | ORAL | Status: DC | PRN

## 2020-04-02 MED ORDER — LORAZEPAM 2 MG/ML PO CONC: 1.0000 mg | ORAL | Status: DC | PRN

## 2020-04-02 MED ORDER — HALOPERIDOL LACTATE 2 MG/ML PO CONC
0.5000 mg | ORAL | Status: DC | PRN
  Filled 2020-04-02: qty 0.3

## 2020-04-02 MED ORDER — TRAZODONE HCL 50 MG PO TABS
50.0000 mg | ORAL_TABLET | Freq: Every day | ORAL | Status: DC
  Administered 2020-04-02: 50 mg via ORAL
  Filled 2020-04-02: qty 1

## 2020-04-02 MED ORDER — HYDROCOD POLST-CPM POLST ER 10-8 MG/5ML PO SUER
5.0000 mL | Freq: Two times a day (BID) | ORAL | Status: DC
  Administered 2020-04-02 (×2): 5 mL via ORAL
  Filled 2020-04-02 (×3): qty 5

## 2020-04-02 MED ORDER — HALOPERIDOL LACTATE 5 MG/ML IJ SOLN: 0.5000 mg | INTRAMUSCULAR | Status: DC | PRN

## 2020-04-02 MED ORDER — METOPROLOL TARTRATE 25 MG PO TABS
25.0000 mg | ORAL_TABLET | Freq: Two times a day (BID) | ORAL | Status: DC
  Administered 2020-04-02: 25 mg via ORAL
  Filled 2020-04-02: qty 1

## 2020-04-02 NOTE — Progress Notes (Signed)
Modified Barium Swallow Progress Note  Patient Details  Name: Lerone Onder MRN: [id number] Date of Birth: 1932/02/15  Today's Date: 04/02/2020  Modified Barium Swallow completed.  Full report located under Chart Review in the Imaging Section.  Brief recommendations include the following:  Clinical Impression  Patient presenst with a moderate-severe pharyngeal phase dysphagia, worsening in severity from previous study. Continued delay in swallow initiation results in immediate penetration and intermittent aspiration of thin liquids.  Combination of base of tongue and pharyngeal weakness results in significant post swallow residuals, greater in the vallecula than in the pyriform sinuses with resultant post swallow penetration and aspiration with significantly delayed sensation.  This occurs across consistencies and presentation of boluses and is not prevented with use of chin tuck or head turn postures. Most effective compensatory strategy is a hard throat clear and dry swallow which temporarily clears the airway however risk of continued aspiration post swallow is high given degree of residuals without ability to fully clear. Discussed with patient and daugther in length over the phone. Both wish to continue eating and patient has verbalized his dislike for thickened liquids. Although aspiration less with thickened liquids during exam, SLP in agreement that consuming thickened liquids will not necessarily decrease aspiration risk not the risk of an aspiration related infection at this time. Patient wishes to continue eating and drinking thin liquids with known risk of aspiration. Discussed compensatory strategies/aspiration precautions which may mitigate but not fully elimimate risk. Condition is no doubt chronic in nature however unclear how much it might improve in severity with improved condition overall as patient is currently very debilitated. Will f/u for education as needed and reinforcement of  compensatory strategies.      Swallow Evaluation Recommendations       SLP Diet Recommendations: Dysphagia 3 (Mech soft) solids;Thin liquid(per patient/daugther preference)   Liquid Administration via: Cup;No straw   Medication Administration: Crushed with puree   Supervision: Full supervision/cueing for compensatory strategies;Staff to assist with self feeding   Compensations: Slow rate;Small sips/bites;Clear throat intermittently;Effortful swallow(throat clear every 2-3 bites/sips)   Postural Changes: Seated upright at 90 degrees   Oral Care Recommendations: Oral care BID     Anetria Harwick MA, CCC-SLP     Trinitee Horgan [name] 04/02/2020,1:01 PM

## 2020-04-02 NOTE — Progress Notes (Signed)
[facility] Progress Note  Patient: [name]    OBS:[id number]  DOA: 04/21/2020     Date of Service: the patient was seen and examined on 04/02/2020  Chief complaint. Shortness of breath.  Brief hospital course: Past medical history of asthma, hypertension, diastolic dysfunction, MSSA bacteremia, previous stroke, chronic hypoxic respiratory failure and aspiration. Patient presents with complaints of recurrent right pleural effusion with shortness of breath.  SP thoracentesis on 04/01/2020 with 1.2 L fluid removed.  Analysis shows exudative fluid. Currently plan is transition to comfort care and monitor response  Assessment and Plan: 1.  Recurrent right pleural effusion Recent aspiration pneumonia Follow-up x-ray after thoracentesis still shows some pleural effusion. Last admission patient had a follow-up CT scan which did not show any evidence of malignancy. Analysis of the fluid showed exudative fluid. Fluid was dark blood-tinged this time. The frequency at which the patient is having recurrent pleural effusion is concerning for a malignant process although cytology was negative last time. Follow-up on cytology at this time. Patient has significant weight loss. Given leukocytosis I will order the antibiotics although do not see much benefit given patient's recurrent aspiration risk.  2.  Dysphagia Recent aspiration pneumonia Patient has severe dysphagia and should be on thickened liquids at a minimum but has been drinking thin liquids since diagnosed. Patient would not accept thickened liquids. Discussed with daughter at bedside regarding aspiration being mechanical problem and for chronic dysphagia lack of ability of diet modification preventing somebody from having aspiration and eventual recurrent pneumonia which might be the cause of patient's current presentation. All this poses a significant risk to patient's prognosis and recurrence of it does frequently certainly poses  prognosis less than 6 months. Speech therapy consulted. Modified barium swallow performed patient remains at risk for aspiration regardless of any modality.  3.  Goals of care Patient has recurrent right pleural effusion. Patient has severe aspiration risk and is unable to tolerate any degree of diet thickness without aspiration. Patient does not appear to have significant reserves. Patient does not want to modify his liquid intake. Patient has lost 10 pounds weight in the last few weeks. Appears debilitated and currently lethargic as well. Patient has significant recurrent pleural effusion requiring 2 thoracentesis 3 weeks apart yielding more than a liter of fluid which is exudative in nature. At present concern is that the patient is progressively declining and at risk for poor outcomes in neck 6 months. Patient already following up with [facility] and has a MOST form which dictates DNR/DNI and consider treatment if it is appropriate for medical conditions. Explained to daughter and the patient diet given that aspiration is a real risk patient remains at risk for developing pneumonia which is a medical condition which will lead to further decline in health. Recommend transition to comfort care and hospice at the time of discharge. Patient will also benefit from a Pleurx catheter given the size of the effusion and recurrence of it should he need repeat thoracentesis. Family meeting on 04/02/2020, spent 45 minutes with family along with palliative care explained poor prognosis.  Family currently agreeable to transition to complete comfort  4.  Chronic diastolic CHF Essential hypertension EF 50 to 55%. Patient was given IV Lasix. We will monitor. Continue metoprolol.  5.  History of depression Poor p.o. intake and anorexia Continue home regimen.  6.  History of COPD with chronic respiratory failure hypoxic Continue oxygen continue nebulizers  Body mass index is 20.74 kg/m.  Nutrition  Problem: Inadequate  oral intake Etiology: acute illness, chronic illness(recurrent pleural effusion, chronic hypoxic respiratory failure on 2-3 L at baseline) Interventions: Interventions: Refer to RD note for recommendations      Diet: Dysphagia 2 diet DVT Prophylaxis: Subcutaneous Heparin    Advance goals of care discussion: DNR  Family Communication: family was present at bedside, at the time of interview.  Opportunity was given to ask question and all questions were answered satisfactorily.   Disposition:  Current level of care for the pt is Inpatient  Remains inpatient appropriate because: Arrangement for home with hospice will take time.  Currently unsafe discharge given discharge planning with hospice  Dispo: The patient is from: Home              Anticipated d/c is to: Home with hospice              Anticipated d/c date is: 2 days              Patient currently is not medically stable to d/c.  Subjective: Continues to have shortness of breath.  Continues to have cough.  No nausea no vomiting.  Physical Exam:  General: Appear in moderate distress, no Rash; Oral Mucosa Clear, moist. no Abnormal Neck Mass Or lumps, Conjunctiva normal  Cardiovascular: S1 and S2 Present, aortic systolic  Murmur, Respiratory: increased respiratory effort, Bilateral Air entry present and bilateral  Crackles, occasional wheezes Abdomen: Bowel Sound present, Soft and no tenderness Extremities: no Pedal edema, no calf tenderness Neurology: alert and oriented to person affect flat in affect. no new focal deficit Gait not checked due to patient safety concerns  Vitals:   04/01/20 2238 04/02/20 0732 04/02/20 0733 04/02/20 1600  BP: 119/69 116/66  114/72  Pulse: 95 84  92  Resp: (!) 24 16  17   Temp: 98.3 F (36.8 C) 98 F (36.7 C)  98.2 F (36.8 C)  TempSrc:      SpO2: 96% 94% 95% 97%  Weight:      Height:        Intake/Output Summary (Last 24 hours) at 04/02/2020 1831 Last data filed at  04/02/2020 1456 Gross per 24 hour  Intake 60 ml  Output 700 ml  Net -640 ml   Filed Weights   04/19/2020 2112  Weight: 58.3 kg    Data Reviewed: I have personally reviewed and interpreted daily labs, tele strips, imagings as discussed above. I reviewed all nursing notes, pharmacy notes, vitals, pertinent old records I have discussed plan of care as described above with RN and patient/family.  CBC: Recent Labs  Lab 04/01/20 0346 04/02/20 0453  WBC 8.2 14.7*  NEUTROABS  --  12.7*  HGB 13.2 14.2  HCT 41.3 43.5  MCV 100.2* 96.2  PLT 252 630   Basic Metabolic Panel: Recent Labs  Lab 04/01/20 0346 04/02/20 0453  NA 143 139  K 3.6 4.1  CL 95* 92*  CO2 38* 36*  GLUCOSE 93 130*  BUN 12 14  CREATININE 1.04 0.96  CALCIUM 8.7* 8.7*    Studies: DG CHEST PORT 1 VIEW  Result Date: 04/02/2020 CLINICAL DATA:  Shortness of breath, agitation, RIGHT pleural effusion, history hypertension, CHF, stroke EXAM: PORTABLE CHEST 1 VIEW COMPARISON:  Portable exam 0659 hours compared to 04/01/2020 FINDINGS: Normal heart size and pulmonary vascularity. Mass effect upon LEFT lateral aspect of trachea with deviation to the RIGHT, noted on a prior CT. Hiatal hernia. Persistent RIGHT pleural effusion and basilar atelectasis. Remaining lungs clear. No pneumothorax. Bones demineralized.  IMPRESSION: Persistent RIGHT pleural effusion and basilar atelectasis. Electronically Signed   By: Lavonia Dana M.D.   On: 04/02/2020 10:04   DG Swallowing Func-Speech Pathology  Result Date: 04/02/2020 Objective Swallowing Evaluation: Type of Study: MBS-Modified Barium Swallow Study  Patient Details Name: [name] MRN: [id number] Date of Birth: 12-May-1932 Today's Date: 04/02/2020 Time: SLP Start Time (ACUTE ONLY): 1106 -SLP Stop Time (ACUTE ONLY): 1140 SLP Time Calculation (min) (ACUTE ONLY): 34 min Past Medical History: Past Medical History: Diagnosis Date  Allergy   seasonal  fall  Asthma   as child  Cancer ([place])   Basal  cell carcinoma; multiple  Chronic diastolic CHF (congestive heart failure) ([place]) 03/11/2020  Dyspnea   Eczema   Hyperlipidemia   Hypertension   MSSA bacteremia 2020  Osteoporosis   Pleural effusion due to CHF (congestive heart failure) ([place])   Stroke [facility])  Past Surgical History: Past Surgical History: Procedure Laterality Date  CATARACT EXTRACTION, BILATERAL  06/29/2015  INTRAMEDULLARY (IM) NAIL INTERTROCHANTERIC Left 03/30/2017  Procedure: INTRAMEDULLARY (IM) NAIL INTERTROCHANTRIC;  Surgeon: [name], MD;  Location: [place];  Service: Orthopedics;  Laterality: Left;  INTRAMEDULLARY (IM) NAIL INTERTROCHANTERIC Right 11/12/2018  Procedure: INTRAMEDULLARY (IM) NAIL RIGHT INTERTROCHANTERIC HIP FRACTURE;  Surgeon: [name], MD;  Location: [place];  Service: Orthopedics;  Laterality: Right;  MOHS SURGERY    TEE WITHOUT CARDIOVERSION N/A 07/20/2019  Procedure: TRANSESOPHAGEAL ECHOCARDIOGRAM (TEE);  Surgeon: [name], MD;  Location: [facility] ENDOSCOPY;  Service: Cardiovascular;  Laterality: N/A;  TONSILLECTOMY  1940 maybe HPI: [name] is a 86 y.o. male with medical history significant for asthma, hypertension, diastolic dysfunction, MSSA bacteremia thought secondary to eczema treated in 06/2019, previous stroke, chronic hypoxic failure on 2 to 3 L of oxygen at home and debility who presented to the emergency department via EMS for evaluation of hypoxia.  Patient noted to have labored breathing as well as audible wheezes.  Patient was recently hospitalized and is status post thoracentesis for right pleural effusion.  Patient was scheduled for another paracentesis on Monday 04/03/20.  MBSS in 09/21/20 revealed moderate dysphagia with recommendations for chopped/ground diet and nectar thick liquids.  Pt has been consuming thin liquid at home as he finds thickened consistencies unpalatable.  Subjective: Pt awake, alert, pleasant.  Daughter present for evaluation Assessment / Plan / Recommendation CHL IP  CLINICAL IMPRESSIONS 04/02/2020 Clinical Impression Patient presenst with a moderate-severe pharyngeal phase dysphagia, worsening in severity from previous study. Continued delay in swallow initiation results in immediate penetration and intermittent aspiration of thin liquids.  Combination of base of tongue and pharyngeal weakness results in significant post swallow residuals, greater in the vallecula than in the pyriform sinuses with resultant post swallow penetration and aspiration with significantly delayed sensation.  This occurs across consistencies and presentation of boluses and is not prevented with use of chin tuck or head turn postures. Most effective compensatory strategy is a hard throat clear and dry swallow which temporarily clears the airway however risk of continued aspiration post swallow is high given degree of residuals without ability to fully clear. Discussed with patient and daugther in length over the phone. Both wish to continue eating and patient has verbalized his dislike for thickened liquids. Although aspiration less with thickened liquids during exam, SLP in agreement that consuming thickened liquids will not necessarily decrease aspiration risk not the risk of an aspiration related infection at this time. Patient wishes to continue eating and drinking thin liquids with known risk of  aspiration. Discussed compensatory strategies/aspiration precautions which may mitigate but not fully elimimate risk. Condition is no doubt chronic in nature however unclear how much it might improve in severity with improved condition overall as patient is currently very debilitated. Will f/u for education as needed and reinforcement of compensatory strategies.    SLP Visit Diagnosis Dysphagia, oropharyngeal phase (R13.12) Attention and concentration deficit following -- Frontal lobe and executive function deficit following -- Impact on safety and function Severe aspiration risk   CHL IP TREATMENT  RECOMMENDATION 04/02/2020 Treatment Recommendations Therapy as outlined in treatment plan below   Prognosis 09/22/2019 Prognosis for Safe Diet Advancement Fair Barriers to Reach Goals -- Barriers/Prognosis Comment -- CHL IP DIET RECOMMENDATION 04/02/2020 SLP Diet Recommendations Dysphagia 3 (Mech soft) solids;Thin liquid Liquid Administration via Cup;No straw Medication Administration Crushed with puree Compensations Slow rate;Small sips/bites;Clear throat intermittently;Effortful swallow Postural Changes Seated upright at 90 degrees   CHL IP OTHER RECOMMENDATIONS 04/02/2020 Recommended Consults -- Oral Care Recommendations Oral care BID Other Recommendations --   CHL IP FOLLOW UP RECOMMENDATIONS 04/02/2020 Follow up Recommendations Home health SLP;Outpatient SLP   CHL IP FREQUENCY AND DURATION 04/02/2020 Speech Therapy Frequency (ACUTE ONLY) min 2x/week Treatment Duration 1 week      CHL IP ORAL PHASE 04/02/2020 Oral Phase WFL Oral - Pudding Teaspoon -- Oral - Pudding Cup -- Oral - Honey Teaspoon -- Oral - Honey Cup -- Oral - Nectar Teaspoon -- Oral - Nectar Cup -- Oral - Nectar Straw -- Oral - Thin Teaspoon -- Oral - Thin Cup -- Oral - Thin Straw -- Oral - Puree -- Oral - Mech Soft -- Oral - Regular -- Oral - Multi-Consistency -- Oral - Pill -- Oral Phase - Comment --  CHL IP PHARYNGEAL PHASE 04/02/2020 Pharyngeal Phase Impaired Pharyngeal- Pudding Teaspoon -- Pharyngeal -- Pharyngeal- Pudding Cup -- Pharyngeal -- Pharyngeal- Honey Teaspoon -- Pharyngeal -- Pharyngeal- Honey Cup -- Pharyngeal -- Pharyngeal- Nectar Teaspoon Reduced pharyngeal peristalsis;Reduced tongue base retraction;Reduced airway/laryngeal closure;Penetration/Aspiration during swallow;Penetration/Apiration after swallow;Pharyngeal residue - valleculae;Pharyngeal residue - pyriform;Pharyngeal residue - cp segment Pharyngeal Material enters airway, CONTACTS cords and not ejected out Pharyngeal- Nectar Cup Reduced pharyngeal peristalsis;Reduced tongue base  retraction;Reduced airway/laryngeal closure;Penetration/Aspiration during swallow;Penetration/Apiration after swallow;Pharyngeal residue - valleculae;Pharyngeal residue - pyriform;Pharyngeal residue - cp segment Pharyngeal Material enters airway, CONTACTS cords and not ejected out Pharyngeal- Nectar Straw -- Pharyngeal -- Pharyngeal- Thin Teaspoon Reduced pharyngeal peristalsis;Reduced tongue base retraction;Reduced airway/laryngeal closure;Penetration/Aspiration during swallow;Penetration/Apiration after swallow;Pharyngeal residue - valleculae;Pharyngeal residue - pyriform;Pharyngeal residue - cp segment;Delayed swallow initiation-pyriform sinuses;Penetration/Aspiration before swallow;Moderate aspiration Pharyngeal Material enters airway, passes BELOW cords and not ejected out despite cough attempt by patient Pharyngeal- Thin Cup Reduced pharyngeal peristalsis;Reduced tongue base retraction;Reduced airway/laryngeal closure;Penetration/Aspiration during swallow;Penetration/Apiration after swallow;Pharyngeal residue - valleculae;Pharyngeal residue - pyriform;Pharyngeal residue - cp segment;Delayed swallow initiation-pyriform sinuses;Penetration/Aspiration before swallow;Moderate aspiration Pharyngeal Material enters airway, passes BELOW cords and not ejected out despite cough attempt by patient Pharyngeal- Thin Straw -- Pharyngeal -- Pharyngeal- Puree Reduced pharyngeal peristalsis;Reduced tongue base retraction;Pharyngeal residue - valleculae;Pharyngeal residue - pyriform Pharyngeal -- Pharyngeal- Mechanical Soft Reduced pharyngeal peristalsis;Reduced tongue base retraction;Pharyngeal residue - valleculae Pharyngeal -- Pharyngeal- Regular -- Pharyngeal -- Pharyngeal- Multi-consistency -- Pharyngeal -- Pharyngeal- Pill -- Pharyngeal -- Pharyngeal Comment --  CHL IP CERVICAL ESOPHAGEAL PHASE 04/02/2020 Cervical Esophageal Phase WFL Pudding Teaspoon -- Pudding Cup -- Honey Teaspoon -- Honey Cup -- Nectar Teaspoon --  Nectar Cup -- Nectar Straw -- Thin Teaspoon -- Thin Cup -- Thin Straw -- Puree -- Mechanical  Soft -- Regular -- Multi-consistency -- Pill -- Cervical Esophageal Comment -- Gabriel Rainwater MA, CCC-SLP [place] [facility] [name] 04/02/2020, 1:01 PM               Scheduled Meds:  amoxicillin-clavulanate  1 tablet Oral TID   budesonide  0.25 mg Inhalation BID   chlorpheniramine-HYDROcodone  5 mL Oral Q12H   feeding supplement (ENSURE ENLIVE)  237 mL Oral BID BM   furosemide  20 mg Intravenous Daily   mouth rinse  15 mL Mouth Rinse BID   mirtazapine  15 mg Oral QHS   montelukast  10 mg Oral QHS   senna-docusate  1 tablet Oral BID   sodium chloride flush  3 mL Intravenous Q12H   traZODone  50 mg Oral QHS   Continuous Infusions:  sodium chloride     PRN Meds: sodium chloride, acetaminophen, haloperidol **OR** haloperidol **OR** haloperidol lactate, ipratropium-albuterol, LORazepam **OR** LORazepam **OR** LORazepam, morphine CONCENTRATE **OR** morphine CONCENTRATE, ondansetron **OR** ondansetron (ZOFRAN) IV, sodium chloride flush  Time spent: 30 minutes for initial evaluation  40 minutes spent between 04:00pm and 04:40PM for discussion of goals of care.   Author: Berle Mull, MD [facility] 04/02/2020 6:31 PM  To reach On-call, see care teams to locate the attending and reach out via [email or web address]. Between 7PM-7AM, please contact night-coverage If you still have difficulty reaching the attending provider, please page the [facility] (Director on Call) for [facility] on amion for assistance.

## 2020-04-02 NOTE — Plan of Care (Signed)
  Problem: Education: Goal: Knowledge of General Education information will improve Description: Including pain rating scale, medication(s)/side effects and non-pharmacologic comfort measures Outcome: Progressing   Problem: Health Behavior/Discharge Planning: Goal: Ability to manage health-related needs will improve Outcome: Progressing   Problem: Clinical Measurements: Goal: Ability to maintain clinical measurements within normal limits will improve Outcome: Progressing Goal: Will remain free from infection Outcome: Progressing Goal: Diagnostic test results will improve Outcome: Progressing Goal: Cardiovascular complication will be avoided Outcome: Progressing   Problem: Coping: Goal: Level of anxiety will decrease Outcome: Progressing   Problem: Elimination: Goal: Will not experience complications related to bowel motility Outcome: Progressing Goal: Will not experience complications related to urinary retention Outcome: Progressing   Problem: Pain Managment: Goal: General experience of comfort will improve Outcome: Progressing   Problem: Safety: Goal: Ability to remain free from injury will improve Outcome: Progressing   Problem: Skin Integrity: Goal: Risk for impaired skin integrity will decrease Outcome: Progressing   

## 2020-04-02 NOTE — Consult Note (Signed)
Consultation Note Date: 04/02/2020   Patient Name: [name]  DOB: Dec 22, 1931  MRN: [id number]  Age / Sex: 86 y.o., male  PCP: Rutherford Guys, MD Referring Physician: Lavina Hamman, MD  Reason for Consultation: Establishing goals of care  HPI/Patient Profile: 86 y.o. male  with past medical history of hypertension asthma BPH CVA with resulting dysphagia and recurrent aspiration pneumonia, being followed by [facility] palliative at home admitted on 04/22/2020 with increasing weakness, shortness of breath, loss of appetite, weight loss.  Work-up reveals recurrent exudative pleural effusions likely secondary to recurrent aspiration pneumonia/pneumonitis.  Patient and family have expressed that they do not want his diet restricted and that comfort is a primary goal.  Palliative medicine consulted for additional assistance with goals of care.  Clinical Assessment and Goals of Care:  I have reviewed medical records including EPIC notes, labs and imaging, received report from Dr. Posey Pronto, examined the patient and met at bedside with patient's daughter [name], and her brother by speaker phone and Dr. Posey Pronto was also in attendance, to discuss diagnosis prognosis, [place], EOL wishes, disposition and options.  I introduced Palliative Medicine as specialized medical care for people living with serious illness. It focuses on providing relief from the symptoms and stress of a serious illness.   We discussed a brief life review of the patient.  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  He currently lives at home with his daughter, he lives in an attached garage with caretakers who are present 9 AM to 7 PM Monday through Friday 9 AM to 2 PM on the weekends.  He enjoys watching baseball and is a [employer]ves fan.  As far as functional and nutritional status-prior to this admission he had  had ongoing significant decline.  Now primarily bedbound, was able to get up with some assistance using a walker to get to the commode but this was getting harder and harder.  He has had ongoing weight loss with about 10 pounds lost in the last month.  He frequently coughs when trying to eat however he does not like thickened liquids or foods.  We discussed her current illness and what it means in the larger context of her on-going co-morbidities.  Natural disease trajectory and expectations at EOL were discussed.  The continued cycle of aspiration pneumonia, repeated hospitalizations, with continued decline to end-of-life was discussed.  I attempted to elicit values and goals of care important to the patient.  Patient's children were able to acknowledge that the patient finds no joy in returning to the hospital.  The difference between aggressive medical intervention and comfort care was considered in light of the patient's goals of care. All agreed it is in the patient's best interest and goals to transition to full comfort care, and discharge home with hospice.  Questions and concerns were addressed.  The family was encouraged to call with questions or concerns.    Primary Decision Maker NEXT OF KIN-daughter [name]  SUMMARY OF RECOMMENDATIONS   -Transition to full comfort care -Comfort measures and medicines as ordered- d/c unneccesary medications -TOC referral for hospice services family elects [facility] as he is currently followed by the palliative service -Please note patient is not spiritual and does not want visit from chaplain or other mentions of religion  Code Status/Advance Care Planning:  DNR  Palliative Prophylaxis:   Delirium Protocol and Palliative Wound Care  Additional Recommendations (Limitations, Scope, Preferences):  Full Comfort Care  Prognosis:    < 6 weeks  Discharge Planning: Home with Hospice  Primary Diagnoses: Present on Admission: .  Essential hypertension . Pleural effusion on right . Chronic diastolic CHF (congestive heart failure) ([place]) . Recurrent right pleural effusion   I have reviewed the medical record, interviewed the patient and family, and examined the patient. The following aspects are pertinent.  Past Medical History:  Diagnosis Date  . Allergy    seasonal  fall  . Asthma    as child  . Cancer ([place])    Basal cell carcinoma; multiple  . Chronic diastolic CHF (congestive heart failure) ([place]) 03/11/2020  . Dyspnea   . Eczema   . Hyperlipidemia   . Hypertension   . MSSA bacteremia 2020  . Osteoporosis   . Pleural effusion due to CHF (congestive heart failure) ([place])   . Stroke [facility])    Social History   Socioeconomic History  . Marital status: Divorced    Spouse name: Not on file  . Number of children: 2  . Years of education: Not on file  . Highest education level: Not on file  Occupational History  . Occupation: retired  Tobacco Use  . Smoking status: Never Smoker  . Smokeless tobacco: Never Used  Substance and Sexual Activity  . Alcohol use: No    Comment: occas  . Drug use: No  . Sexual activity: Not Currently  Other Topics Concern  . Not on file  Social History Narrative   Marital status: divorced; good friend with ex-wife; not dating      Children: 2 children; 4 grandchildren; no gg      Lives:  Alone in house; daughter six blocks away      Employment: college professor; retired in 1997; history; [place]:  Never      Alcohol: tequila loves but won't buy it.      Exercise:  As much as can; previous competitive biking.      ADLs: quit mowing grass in 2017; no assistant devices; cooks and cleans and pays bills.  No driving; HAS NEVER DRIVEN; has always ridden bike.  Daughter takes patient to grocery store and to appointments.  Daughter drives patient to grocery store.      Advanced Directives:  No living; DNR; do not resuscitate.  HCPOA: [name]    Social Determinants of Health   Financial Resource Strain:   . Difficulty of Paying Living Expenses:   Food Insecurity:   . Worried About Charity fundraiser in the Last Year:   . Arboriculturist in the Last Year:   Transportation Needs:   . Film/video editor (Medical):   Marland Kitchen Lack of Transportation (Non-Medical):   Physical Activity:   . Days of Exercise per Week:   . Minutes of Exercise per Session:   Stress:   . Feeling of Stress :   Social Connections:   . Frequency of Communication with Friends and Family:   .  Frequency of Social Gatherings with Friends and Family:   . Attends Religious Services:   . Active Member of Clubs or Organizations:   . Attends Archivist Meetings:   Marland Kitchen Marital Status:    Family History  Problem Relation Age of Onset  . Arthritis Mother   . Heart disease Mother 14  . Hypertension Father    Scheduled Meds: . amoxicillin-clavulanate  1 tablet Oral TID  . budesonide  0.25 mg Inhalation BID  . feeding supplement (ENSURE ENLIVE)  237 mL Oral BID BM  . furosemide  20 mg Intravenous Daily  . mouth rinse  15 mL Mouth Rinse BID  . metoprolol tartrate  25 mg Oral BID  . mirtazapine  15 mg Oral QHS  . montelukast  10 mg Oral QHS  . pravastatin  40 mg Oral Daily  . senna-docusate  1 tablet Oral BID  . sodium chloride flush  3 mL Intravenous Q12H  . traZODone  50 mg Oral QHS   Continuous Infusions: . sodium chloride     PRN Meds:.sodium chloride, acetaminophen, ipratropium-albuterol, ondansetron **OR** ondansetron (ZOFRAN) IV, sodium chloride flush Medications Prior to Admission:  Prior to Admission medications   Medication Sig Start Date End Date Taking? Authorizing Provider  acetaminophen (TYLENOL) 500 MG tablet Take 500-1,000 mg by mouth as needed for mild pain or headache (pain from eczema).    Yes [provider]  albuterol (PROAIR HFA) 108 (90 Base) MCG/ACT inhaler Inhale 1-2 puffs into the lungs every 6 (six) hours as  needed for wheezing or shortness of breath. 11/19/19  Yes Rutherford Guys, MD  alendronate (FOSAMAX) 70 MG tablet TAKE 1 TABLET EVERY 7 DAYS. TAKE WITH A FULL GLASS OF WATER ON AN EMPTY STOMACH. Patient taking differently: Take 70 mg by mouth once a week. Saturdays. TAKE WITH A FULL GLASS OF WATER ON AN EMPTY STOMACH. 03/13/20  Yes Rutherford Guys, MD  Blood Pressure KIT Please check BP twice a day Patient taking differently: 1 each by Other route in the morning and at bedtime.  03/25/19  Yes Rutherford Guys, MD  clopidogrel (PLAVIX) 75 MG tablet Take 75 mg by mouth daily.   Yes [provider]  fluticasone (FLOVENT HFA) 110 MCG/ACT inhaler Inhale 2 puffs into the lungs 2 (two) times daily. 02/24/20  Yes Rutherford Guys, MD  furosemide (LASIX) 20 MG tablet TAKE 1 TABLET ONCE DAILY. Patient taking differently: Take 20 mg by mouth daily.  03/13/20  Yes Rutherford Guys, MD  metoprolol succinate (TOPROL-XL) 50 MG 24 hr tablet TAKE 1 TABLET DAILY WITH OR IMMEDIATELY FOLLOWING A MEAL. Patient taking differently: Take 50 mg by mouth daily. WITH OR IMMEDIATELY FOLLOWING A MEAL. 03/13/20  Yes Rutherford Guys, MD  mirtazapine (REMERON) 15 MG tablet Take 1 tablet (15 mg total) by mouth at bedtime. 02/24/20  Yes Rutherford Guys, MD  Misc. Devices (WALKER) MISC Use walker whenever walking, standard walker Patient taking differently: 1 each by Other route See admin instructions. Use walker whenever walking, standard walker 07/27/18  Yes Rutherford Guys, MD  montelukast (SINGULAIR) 10 MG tablet Take 1 tablet (10 mg total) by mouth at bedtime. 02/15/20  Yes Maximiano Coss, NP  pravastatin (PRAVACHOL) 40 MG tablet TAKE 1 TABLET ONCE DAILY. Patient taking differently: Take 40 mg by mouth daily.  03/13/20  Yes Rutherford Guys, MD  traZODone (DESYREL) 50 MG tablet Take 1-2 tablets (50-100 mg total) by mouth at bedtime. 11/19/19  Yes Rutherford Guys, MD  triamcinolone (KENALOG) 0.025 % ointment Apply 1  application topically 2 (two) times daily. Patient taking differently: Apply 1 application topically as needed.  11/19/19  Yes Rutherford Guys, MD  guaiFENesin-dextromethorphan [facility] DM) 100-10 MG/5ML syrup Take 5 mLs by mouth every 8 (eight) hours as needed for cough. Patient not taking: Reported on 04/15/2020 02/15/20   Maximiano Coss, NP   Allergies  Allergen Reactions  . Other Shortness Of Breath, Itching and Other (See Comments)    Seasonal allergies- Runny nose, congestion, itchy eyes, and wheezing   Review of Systems  Unable to perform ROS: Mental status change    Physical Exam Nursing note reviewed.  Constitutional:      General: He is awake.     Appearance: He is cachectic.  Eyes:     Comments: ectropian lower eyelids  Pulmonary:     Effort: Tachypnea present.     Comments: Increased effort  Skin:    Coloration: Skin is pale.  Neurological:     Mental Status: He is lethargic.     Comments: Oriented to person and place- not to time or situation  Psychiatric:        Behavior: Behavior is cooperative.     Vital Signs: BP 116/66 (BP Location: Right Arm)   Pulse 84   Temp 98 F (36.7 C)   Resp 16   Ht _0  (1.676 m)   Wt 58.3 kg   SpO2 95%   BMI 20.74 kg/m  Pain Scale: 0-10 POSS *See Group Information*: 1-Acceptable,Awake and alert Pain Score: Asleep   SpO2: SpO2: 95 % O2 Device:SpO2: 95 % O2 Flow Rate: .O2 Flow Rate (L/min): 4 L/min  IO: Intake/output summary:   Intake/Output Summary (Last 24 hours) at 04/02/2020 1500 Last data filed at 04/02/2020 1456 Gross per 24 hour  Intake 60 ml  Output 700 ml  Net -640 ml    LBM: Last BM Date: 04/08/2020 Baseline Weight: Weight: 58.3 kg Most recent weight: Weight: 58.3 kg     Palliative Assessment/Data: PPS: 20%     Thank you for this consult. Palliative medicine will continue to follow and assist as needed.   Time In: 1230 Time Out: 1430 Time Total: 120 minutes Prolonged services billed:  yes Greater than 50%  of this time was spent counseling and coordinating care related to the above assessment and plan.  Signed by: Mariana Kaufman, AGNP-C Palliative Medicine    Please contact Palliative Medicine Team phone at [phone] for questions and concerns.  For individual provider: See Shea Evans

## 2020-04-02 NOTE — Progress Notes (Signed)
Manufacturing engineer [facility])  Received request from [facility] for hospice services at home after discharge.  Chart and pt information under review by [facility] physician.  Hospice eligibility pending at this time.  Hospital liaison spoke with pt's daughter [name] to initiate education related to hospice philosophy and services and to answer any questions at this time. [name] shared concerns about caring for her father if/when he becomes bedbound.  Discussion had about benefits of PT consult inpatient to assess pt's ability to transfer OOB. [name] did verbalize understanding of information given.  Per discussion [name] hopes to have pt discharge home on Tuesday to give her time to prepare home.  Care team made aware of this preference.    Pease send signed and completed DNR home with pt/family.  Please provide prescriptions at discharge as needed to ensure ongoing symptom management.    DME needs discussed.  Patient has hospital bed, O2 concentrator and a wheelchair at home. Portable backup O2 tanks,  Transport chair and a bedside commode to be ordered ahead of discharge.  Address has been verified and is correct in the chart.  ACC information and contact numbers given to duaghter [name].  Above information shared with [place] Manager.  Please call with any questions or concerns.  Thank you for the opportunity to participate in this pt's care.  Domenic Moras, BSN, RN [employer] [phone] (24h on call)

## 2020-04-03 ENCOUNTER — Telehealth: Payer: Self-pay | Admitting: Family Medicine

## 2020-04-03 ENCOUNTER — Ambulatory Visit (HOSPITAL_COMMUNITY): Admission: RE | Admit: 2020-04-03 | Payer: Medicare HMO | Source: Ambulatory Visit

## 2020-04-03 ENCOUNTER — Encounter (HOSPITAL_COMMUNITY): Payer: Self-pay

## 2020-04-03 ENCOUNTER — Ambulatory Visit (HOSPITAL_COMMUNITY): Payer: Medicare HMO

## 2020-04-03 DIAGNOSIS — Z515 Encounter for palliative care: Secondary | ICD-10-CM

## 2020-04-03 LAB — PATHOLOGIST SMEAR REVIEW: Path Review: INCREASED

## 2020-04-03 MED ORDER — GLYCOPYRROLATE 0.2 MG/ML IJ SOLN: 0.1000 mg | INTRAMUSCULAR | Status: DC | PRN

## 2020-04-03 MED ORDER — GLYCOPYRROLATE 0.2 MG/ML IJ SOLN
0.1000 mg | Freq: Once | INTRAMUSCULAR | Status: AC
  Administered 2020-04-03: 0.1 mg via INTRAVENOUS
  Filled 2020-04-03: qty 1

## 2020-04-03 MED ORDER — MORPHINE SULFATE (PF) 2 MG/ML IV SOLN
1.0000 mg | INTRAVENOUS | Status: DC | PRN
  Filled 2020-04-03: qty 1

## 2020-04-03 MED ORDER — HYDROCOD POLST-CPM POLST ER 10-8 MG/5ML PO SUER: 5.0000 mL | Freq: Two times a day (BID) | ORAL | Status: DC | PRN

## 2020-04-03 NOTE — Progress Notes (Signed)
Updated dayshift nurse Andee Poles and reached out to attending who stated he is on his way to bedside. Pt daughter is still at bedside and is a little puzzled about situation. Stated that she thought she had more time.   I gave her my opinion but it told her that she should speak with the doctor.

## 2020-04-03 NOTE — Progress Notes (Addendum)
Pt unresponsive at this time. Vitals are stable.  On call provider Bl[place] notified. Told to call family and make aware. Spoke to [place] and left message for [name]. [name] stated that she would get in touch with [name]. At pts bedside to promote comfort.    Spoke with daughter [name] and gave update. Told her pts vitals are stable but pt is unresponsive to touch, sternal rub, and voice. We also spoke about the process of pt transitioning. Discussed that its no true way to know when that time comes, but at the moment pts vitals are stable and he is unresponsive. Daughter stated that she would come to hospital. This nurse told daughter to take her time and get here safely.

## 2020-04-03 NOTE — Progress Notes (Signed)
SLP Cancellation Note  Patient Details Name: [name] MRN: [id number] DOB: 1931-11-17   Cancelled treatment:       Reason Eval/Treat Not Completed: Patient's level of consciousness.  Reviewed chart and noted events of past 24 hours. Patient now [name]. SLP signing off. Please re-consult if needed.   Verita Kuroda MA, CCC-SLP     [name] [name] 04/03/2020, 11:23 AM

## 2020-04-03 NOTE — Progress Notes (Signed)
Manufacturing engineer [facility])  Received request from [facility] for hospice services at home after discharge.  Chart and pt information under review by [facility] physician.  Hospice eligibility pending at this time. According to Palliative team we are going to be on hold at this time due to likely hospital death. Patient had worsening decline overnight.   Spoke with pts daughter [name] who agrees that we should hold off on ordering DME at this time until further notice. She is unsure that at this point her father will even make it home given his current status. I made her aware that we will continue to follow and she could contact us for any questions or concerns.   Please call with any questions or concerns.  Clementeen Hoof, RN, [facility] Liaison [phone]

## 2020-04-03 NOTE — Telephone Encounter (Signed)
[facility] called to ask for a letter basically certifying that due to [name] condition he will not recover and he has less than 6 months to live. Could you write something meeting these criteria and I can fax it to the [facility] team. Thank you

## 2020-04-03 NOTE — Progress Notes (Signed)
Daily Progress Note   Patient Name: [name]       Date: 04/03/2020 DOB: 1932/06/21  Age: 86 y.o. MRN#: [id number] Attending Physician: Lavina Hamman, MD Primary Care Physician: Rutherford Guys, MD Admit Date: 04/20/2020  Reason for Consultation/Follow-up: Establishing goals of care and Terminal Care  Subjective: Events of last noted. Patient with sudden decline. Now unresponsive with agonal breathing. Actively dying.  Daughter, [name], at bedside. Other family members are on the way. [name] asked about time/prognosis- minutes-hours is my best guess.  Patient is an organ donor.   ROS -  Unable to complete d/t status Length of Stay: 3  Current Medications: Scheduled Meds:  . mouth rinse  15 mL Mouth Rinse BID  . sodium chloride flush  3 mL Intravenous Q12H    Continuous Infusions: . sodium chloride      PRN Meds: sodium chloride, acetaminophen, glycopyrrolate, haloperidol **OR** haloperidol **OR** haloperidol lactate, ipratropium-albuterol, LORazepam **OR** LORazepam **OR** LORazepam, morphine injection, morphine CONCENTRATE **OR** morphine CONCENTRATE, ondansetron **OR** ondansetron (ZOFRAN) IV, sodium chloride flush  Physical Exam       Cachetic, frail Unresponsive to all stimuli Respirations agonal Radial pulses strong    Vital Signs: BP 112/63 (BP Location: Right Arm)   Pulse (!) 120   Temp 97.6 F (36.4 C) (Axillary)   Resp (!) 5   Ht 5\' 6"  (1.676 m)   Wt 58.3 kg   SpO2 (!) 87%   BMI 20.74 kg/m  SpO2: SpO2: (!) 87 % O2 Device: O2 Device: Nasal Cannula O2 Flow Rate: O2 Flow Rate (L/min): 4 L/min  Intake/output summary:   Intake/Output Summary (Last 24 hours) at 04/03/2020 0948 Last data filed at 04/02/2020 2200 Gross per 24 hour  Intake 60 ml  Output 700 ml  Net  -640 ml   LBM: Last BM Date: 04/25/2020 Baseline Weight: Weight: 58.3 kg Most recent weight: Weight: 58.3 kg       Palliative Assessment/Data: PPS: 10%     Patient Active Problem List   Diagnosis Date Noted  . Exudative pleural effusion   . Dysphagia causing pulmonary aspiration with swallowing   . Frailty syndrome in geriatric patient   . Comfort measures only status   . DNR (do not resuscitate)   . Goals of care, counseling/discussion   . Advanced care planning/counseling  discussion   . Recurrent right pleural effusion 04/20/2020  . Chronic diastolic CHF (congestive heart failure) ([place]) 03/11/2020  . Pleural effusion on right 03/11/2020  . Shortness of breath 03/11/2020  . Mixed hyperlipidemia 03/11/2020  . Pneumonia 03/11/2020  . MSSA bacteremia 07/15/2019  . Hypoxia   . Peripheral edema   . Thyroid nodule   . Acute respiratory failure with hypoxia ([place]) 07/14/2019  . HCAP (healthcare-associated pneumonia) 07/14/2019  . Thyroid mass 07/14/2019  . Diaphragm paralysis 05/04/2019  . Restrictive lung disease 05/04/2019  . Altered mental status 04/30/2019  . Confusion 04/30/2019  . Abnormal weight loss 03/23/2019  . Pain in joint of left shoulder 03/23/2019  . Frequent urination 03/23/2019  . Constipation 03/23/2019  . TIA (transient ischemic attack) 03/08/2019  . Hyponatremia 03/08/2019  . Impaired mobility 01/13/2019  . Seasonal allergies 01/13/2019  . Hiatal hernia 01/13/2019  . Pain in right hip 12/24/2018  . S/P right hip fracture 11/11/2018  . Recurrent falls 05/12/2018  . History of CVA (cerebrovascular accident) 01/16/2018  . Nocturnal enuresis   . Fall   . Full incontinence of feces   . Essential hypertension   . Hypoalbuminemia due to protein-calorie malnutrition ([place])   . Abnormality of gait   . Leg edema   . Hip fracture ([place]) 03/30/2017  . Leukocytosis 03/29/2017  . Mild intermittent asthma without complication 09/73/5329  . Chronic renal  insufficiency 09/22/2015  . Tachycardia 09/22/2015  . PVC (premature ventricular contraction) 09/22/2015  . Osteoporosis 06/03/2014  . Basal cell carcinoma of face 06/03/2014  . Benign prostatic hyperplasia 05/27/2013  . Eczema 04/23/2012  . Dyslipidemia 04/23/2012    Palliative Care Assessment & Plan   Patient Profile: 86 y.o. male  with past medical history of hypertension asthma BPH CVA with resulting dysphagia and recurrent aspiration pneumonia, being followed by [facility] palliative at home admitted on 04/15/2020 with increasing weakness, shortness of breath, loss of appetite, weight loss.  Work-up reveals recurrent exudative pleural effusions likely secondary to recurrent aspiration pneumonia/pneumonitis.  Patient and family have expressed that they do not want his diet restricted and that comfort is a primary goal.  Palliative medicine consulted for additional assistance with goals of care.  Assessment/Recommendations/Plan  Actively dying- likely minutes to hours- continue current comfort measures, cancel Hospice referral  Goals of Care and Additional Recommendations: Limitations on Scope of Treatment: Full Comfort Care  Code Status: DNR  Prognosis:  Hours - Days  Discharge Planning: Anticipated Hospital Death  Care plan was discussed with patient's daughter and care team.  Thank you for allowing the Palliative Medicine Team to assist in the care of this patient.   Time In: 0920 Time Out: 0955 Total Time 35 mins Prolonged Time Billed no      Greater than 50%  of this time was spent counseling and coordinating care related to the above assessment and plan.  Mariana Kaufman, AGNP-C Palliative Medicine   Please contact Palliative Medicine Team phone at [phone] for questions and concerns.

## 2020-04-03 NOTE — Telephone Encounter (Signed)
Roderic Ovens from [employer] called regarding if she could get a verbal certification of terminal illness for pt. Latasha's number is [phone] Ext: [phone] Please advise.

## 2020-04-03 NOTE — Progress Notes (Addendum)
[facility] Progress Note  Patient: [name]    ERD:[id number]  DOA: 04/08/2020     Date of Service: the patient was seen and examined on 04/03/2020  Chief complaint. Shortness of breath.  Brief hospital course: Past medical history of asthma, hypertension, diastolic dysfunction, MSSA bacteremia, previous stroke, chronic hypoxic respiratory failure and aspiration. Patient presents with complaints of recurrent right pleural effusion with shortness of breath.  SP thoracentesis on 04/01/2020 with 1.2 L fluid removed.  Analysis shows exudative fluid. Currently plan is transition to comfort care  Assessment and Plan: 1.  Recurrent right pleural effusion Recent aspiration pneumonia Follow-up x-ray after thoracentesis still shows some pleural effusion. Last admission patient had a follow-up CT scan which did not show any evidence of malignancy. Analysis of the fluid showed exudative fluid. Fluid was dark blood-tinged this time. The frequency at which the patient is having recurrent pleural effusion is concerning for a malignant process although cytology was negative last time. Patient has significant weight loss.  2.  Dysphagia Recent aspiration pneumonia Patient has severe dysphagia and should be on thickened liquids at a minimum but has been drinking thin liquids since diagnosed. Patient would not accept thickened liquids. Discussed with daughter at bedside regarding aspiration being mechanical problem and for chronic dysphagia lack of ability of diet modification preventing somebody from having aspiration and eventual recurrent pneumonia which might be the cause of patient's current presentation. All this poses a significant risk to patient's prognosis and recurrence of it does frequently certainly poses prognosis less than 6 months. Speech therapy consulted. Modified barium swallow performed patient remains at risk for aspiration regardless of any modality.  3.  Goals of care Patient  has recurrent right pleural effusion. Patient has severe aspiration risk and is unable to tolerate any degree of diet thickness without aspiration. Patient does not appear to have significant reserves. Patient does not want to modify his liquid intake. Patient has lost 10 pounds weight in the last few weeks. Appears debilitated and currently lethargic as well. Patient has significant recurrent pleural effusion requiring 2 thoracentesis 3 weeks apart yielding more than a liter of fluid which is exudative in nature. At present concern is that the patient is progressively declining and at risk for poor outcomes in neck 6 months. Patient already following up with [facility] and has a MOST form which dictates DNR/DNI and consider treatment if it is appropriate for medical conditions. Explained to daughter and the patient diet given that aspiration is a real risk patient remains at risk for developing pneumonia which is a medical condition which will lead to further decline in health. Recommend transition to comfort care and hospice at the time of discharge. Patient will also benefit from a Pleurx catheter given the size of the effusion and recurrence of it should he need repeat thoracentesis. Family meeting on 04/02/2020, spent 45 minutes with family along with palliative care explained poor prognosis.  Family currently agreeable to transition to complete comfort  04/03/2020 patient's condition significantly worse from yesterday.  Minimally responsive with agonal gasp.  More likely looking at in hospital death.  Not stable enough to be transferred to home for now.  4.  Chronic diastolic CHF Essential hypertension EF 50 to 55%. Patient was given IV Lasix. We will monitor.  5.  History of depression Poor p.o. intake and anorexia Continue home regimen.  6.  History of COPD with chronic respiratory failure hypoxic Continue oxygen continue nebulizers  Body mass index is 20.74 kg/m.  Nutrition  Problem: Inadequate oral intake Etiology: acute illness, chronic illness(recurrent pleural effusion, chronic hypoxic respiratory failure on 2-3 L at baseline) Interventions: Interventions: Refer to RD note for recommendations      Diet: Dysphagia 2 diet DVT Prophylaxis: Subcutaneous Heparin    Advance goals of care discussion: DNR  Family Communication: family was present at bedside, at the time of interview.  Opportunity was given to ask question and all questions were answered satisfactorily.   Disposition:  Current level of care for the pt is Inpatient  Remains inpatient appropriate because: Arrangement for home with hospice will take time.  Currently unsafe discharge, more likely will suffer in hospital death  Dispo: The patient is from: Home              Anticipated d/c is to: Anticipate in hospital death.              Anticipated d/c date is: 2 days              Patient currently is not medically stable to d/c.  Subjective: Comfortable minimally responsive.  No nausea no vomiting.  Physical Exam:  General: Appear in mild distress, no Rash; Oral Mucosa Clear, moist. no Abnormal Neck Mass Or lumps, Cardiovascular: S1 and S2 Present, aortic systolic  Murmur, Respiratory: increased respiratory effort, Bilateral Air entry present and bilateral  Crackles, occasional wheezes Abdomen: Bowel Sound present, Soft and no tenderness Extremities: no Pedal edema, no calf tenderness Neurology: Lethargic and not oriented to person no new focal deficit Gait not checked due to patient safety concerns  Vitals:   04/03/20 0421 04/03/20 0659 04/03/20 0805 04/03/20 0900  BP: 123/79 120/60 112/63   Pulse: (!) 121 (!) 120    Resp: (!) 24 (!) 5  (!) 0  Temp:   97.6 F (36.4 C)   TempSrc:   Axillary   SpO2: 96% (!) 89% (!) 87%   Weight:      Height:        Intake/Output Summary (Last 24 hours) at 04/03/2020 1854 Last data filed at 04/02/2020 2200 Gross per 24 hour  Intake 60 ml  Output --   Net 60 ml   Filed Weights   04/26/2020 2112  Weight: 58.3 kg    Data Reviewed: I have personally reviewed and interpreted daily labs, tele strips, imagings as discussed above. I reviewed all nursing notes, pharmacy notes, vitals, pertinent old records I have discussed plan of care as described above with RN and patient/family.  CBC: Recent Labs  Lab 04/01/20 0346 04/02/20 0453  WBC 8.2 14.7*  NEUTROABS  --  12.7*  HGB 13.2 14.2  HCT 41.3 43.5  MCV 100.2* 96.2  PLT 252 354   Basic Metabolic Panel: Recent Labs  Lab 04/01/20 0346 04/02/20 0453  NA 143 139  K 3.6 4.1  CL 95* 92*  CO2 38* 36*  GLUCOSE 93 130*  BUN 12 14  CREATININE 1.04 0.96  CALCIUM 8.7* 8.7*    Studies: No results found.  Scheduled Meds: . mouth rinse  15 mL Mouth Rinse BID  . sodium chloride flush  3 mL Intravenous Q12H   Continuous Infusions: . sodium chloride     PRN Meds: sodium chloride, acetaminophen, chlorpheniramine-HYDROcodone, glycopyrrolate, haloperidol **OR** haloperidol **OR** haloperidol lactate, ipratropium-albuterol, LORazepam **OR** LORazepam **OR** LORazepam, morphine injection, morphine CONCENTRATE **OR** morphine CONCENTRATE, ondansetron **OR** ondansetron (ZOFRAN) IV, sodium chloride flush  Time spent: 30 minutes for initial evaluation  40 minutes spent between 04:00pm and 04:40PM  for discussion of goals of care.   Author: Berle Mull, MD [facility] 04/03/2020 6:54 PM  To reach On-call, see care teams to locate the attending and reach out via [email or web address]. Between 7PM-7AM, please contact night-coverage If you still have difficulty reaching the attending provider, please page the [facility] (Director on Call) for [facility] on amion for assistance.

## 2020-04-04 ENCOUNTER — Ambulatory Visit (HOSPITAL_COMMUNITY): Payer: Medicare HMO

## 2020-04-04 DIAGNOSIS — R54 Age-related physical debility: Secondary | ICD-10-CM

## 2020-04-04 DIAGNOSIS — Z7189 Other specified counseling: Secondary | ICD-10-CM

## 2020-04-04 DIAGNOSIS — Z515 Encounter for palliative care: Secondary | ICD-10-CM

## 2020-04-04 DIAGNOSIS — Z66 Do not resuscitate: Secondary | ICD-10-CM

## 2020-04-04 DIAGNOSIS — R0902 Hypoxemia: Secondary | ICD-10-CM

## 2020-04-04 MED ORDER — LORAZEPAM 2 MG/ML IJ SOLN
2.0000 mg | INTRAMUSCULAR | Status: DC
  Administered 2020-04-04: 2 mg via INTRAVENOUS
  Filled 2020-04-04: qty 1

## 2020-04-04 MED ORDER — MORPHINE SULFATE (PF) 2 MG/ML IV SOLN
2.0000 mg | INTRAVENOUS | Status: DC
  Administered 2020-04-04: 2 mg via INTRAVENOUS
  Filled 2020-04-04: qty 1

## 2020-04-04 MED ORDER — MORPHINE 100MG IN NS 100ML (1MG/ML) PREMIX INFUSION
1.0000 mg/h | INTRAVENOUS | Status: DC
  Administered 2020-04-04: 1 mg/h via INTRAVENOUS
  Filled 2020-04-04 (×2): qty 100

## 2020-04-04 MED ORDER — LORAZEPAM 2 MG/ML IJ SOLN
2.0000 mg | INTRAMUSCULAR | Status: AC
  Administered 2020-04-04: 2 mg via INTRAVENOUS
  Filled 2020-04-04: qty 1

## 2020-04-04 MED ORDER — MORPHINE SULFATE (PF) 2 MG/ML IV SOLN
1.0000 mg | INTRAVENOUS | Status: DC | PRN
  Administered 2020-04-04: 1 mg via INTRAVENOUS
  Filled 2020-04-04 (×2): qty 1

## 2020-04-04 MED ORDER — MORPHINE SULFATE (PF) 2 MG/ML IV SOLN
2.0000 mg | Freq: Four times a day (QID) | INTRAVENOUS | Status: DC
  Administered 2020-04-04: 2 mg via INTRAVENOUS

## 2020-04-05 LAB — CYTOLOGY - NON PAP

## 2020-04-06 LAB — CULTURE, BODY FLUID W GRAM STAIN -BOTTLE: Culture: NO GROWTH

## 2020-04-27 NOTE — Progress Notes (Signed)
Pt taken to morgue by nurse techs. No belongings are with the pt. Family is aware. No update from [facility], called x2 and left message.

## 2020-04-27 NOTE — Progress Notes (Signed)
[facility] Progress Note  Patient: [name]    RSW:[id number]  DOA: 04/13/2020     Date of Service: the patient was seen and examined on 2020/04/18  Chief complaint. Shortness of breath.  Brief hospital course: Past medical history of asthma, hypertension, diastolic dysfunction, MSSA bacteremia, previous stroke, chronic hypoxic respiratory failure and aspiration. Patient presents with complaints of recurrent right pleural effusion with shortness of breath.  SP thoracentesis on 04/01/2020 with 1.2 L fluid removed.  Analysis shows exudative fluid. Currently plan is to provide comfort care.  Anticipating in hospital death  Assessment and Plan: 1.  Recurrent right pleural effusion Recent aspiration pneumonia Follow-up x-ray after thoracentesis still shows some pleural effusion. Last admission patient had a follow-up CT scan which did not show any evidence of malignancy. Analysis of the fluid showed exudative fluid. Fluid was dark blood-tinged this time. The frequency at which the patient is having recurrent pleural effusion is concerning for a malignant process although cytology was negative last time. Patient has significant weight loss.  2.  Dysphagia Recent aspiration pneumonia Patient has severe dysphagia and should be on thickened liquids at a minimum but has been drinking thin liquids since diagnosed. Patient would not accept thickened liquids. Discussed with daughter at bedside regarding aspiration being mechanical problem and for chronic dysphagia lack of ability of diet modification preventing somebody from having aspiration and eventual recurrent pneumonia which might be the cause of patient's current presentation. All this poses a significant risk to patient's prognosis and recurrence of it does frequently certainly poses prognosis less than 6 months. Speech therapy consulted. Modified barium swallow performed patient remains at risk for aspiration regardless of any  modality.  3.  Goals of care Patient has recurrent right pleural effusion. Patient has severe aspiration risk and is unable to tolerate any degree of diet thickness without aspiration. Patient does not appear to have significant reserves. Patient does not want to modify his liquid intake. Patient has lost 10 pounds weight in the last few weeks. Appears debilitated and currently lethargic as well. Patient has significant recurrent pleural effusion requiring 2 thoracentesis 3 weeks apart yielding more than a liter of fluid which is exudative in nature. At present concern is that the patient is progressively declining and at risk for poor outcomes in neck 6 months. Patient already following up with [facility] and has a MOST form which dictates DNR/DNI and consider treatment if it is appropriate for medical conditions. Explained to daughter and the patient diet given that aspiration is a real risk patient remains at risk for developing pneumonia which is a medical condition which will lead to further decline in health. Recommend transition to comfort care and hospice at the time of discharge. Patient will also benefit from a Pleurx catheter given the size of the effusion and recurrence of it should he need repeat thoracentesis. Family meeting on 04/02/2020, spent 45 minutes with family along with palliative care explained poor prognosis.  Family currently agreeable to transition to complete comfort  04/03/2020 patient's condition significantly worse from yesterday.  Minimally responsive with agonal gasp.  More likely looking at in hospital death.  Not stable enough to be transferred to home for now.  04-18-2020 patient continues to have shortness of breath and tachypnea as well as tachycardia. At present I feel that the patient will observe better by continuous IV morphine drip 1 mg/h. Rate could be increased if needed We'll continue as needed morphine in between.  Discussed with palliative care as well  as family  4.  Chronic diastolic CHF Essential hypertension EF 50 to 55%. Patient was given IV Lasix.  5.  History of depression Poor p.o. intake and anorexia Continue home regimen.  6.  History of COPD with chronic respiratory failure hypoxic Continue oxygen continue nebulizers  Body mass index is 20.74 kg/m.  Nutrition Problem: Inadequate oral intake Etiology: acute illness, chronic illness(recurrent pleural effusion, chronic hypoxic respiratory failure on 2-3 L at baseline) Interventions: Interventions: Refer to RD note for recommendations  Diet: Dysphagia 2 diet DVT Prophylaxis: Comfort care  Advance goals of care discussion: DNR  Family Communication: family was present at bedside, at the time of interview.  Opportunity was given to ask question and all questions were answered satisfactorily.   Disposition:  Current level of care for the pt is Inpatient  Remains inpatient appropriate because: Arrangement for home with hospice will take time.  Currently unsafe discharge, more likely will suffer in hospital death  Dispo: The patient is from: Home              Anticipated d/c is to: Anticipate in hospital death.              Anticipated d/c date is: 2 days              Patient currently is not medically stable to d/c.  Subjective: Tachypneic, minimally responsive.  No nausea no vomiting.  Physical Exam:  General: Appear in moderate distress, no Rash;  Cardiovascular: S1 and S2 Present, aortic systolic Murmur, Respiratory: increased respiratory effort, Bilateral Air entry present and bilateral  Crackles, occasional wheezes Abdomen: Bowel Sound absent Extremities: no Pedal edema Neurology: Lethargic and not oriented to person Gait not checked due to patient safety concerns  Vitals:   04/03/20 0900 04/03/20 2040 04/03/20 2311 05/04/2020 0943  BP:   (!) 86/47   Pulse:      Resp: (!) 0 (!) 34 (!) 33 (!) 28  Temp:      TempSrc:      SpO2:   (!) 82%   Weight:       Height:        Intake/Output Summary (Last 24 hours) at 05/04/20 1728 Last data filed at 04/03/2020 2311 Gross per 24 hour  Intake 0 ml  Output --  Net 0 ml   Filed Weights   04/14/2020 2112  Weight: 58.3 kg    Data Reviewed: I have personally reviewed and interpreted daily labs, tele strips, imagings as discussed above. I reviewed all nursing notes, pharmacy notes, vitals, pertinent old records I have discussed plan of care as described above with RN and patient/family.  CBC: Recent Labs  Lab 04/01/20 0346 04/02/20 0453  WBC 8.2 14.7*  NEUTROABS  --  12.7*  HGB 13.2 14.2  HCT 41.3 43.5  MCV 100.2* 96.2  PLT 252 540   Basic Metabolic Panel: Recent Labs  Lab 04/01/20 0346 04/02/20 0453  NA 143 139  K 3.6 4.1  CL 95* 92*  CO2 38* 36*  GLUCOSE 93 130*  BUN 12 14  CREATININE 1.04 0.96  CALCIUM 8.7* 8.7*    Studies: No results found.  Scheduled Meds: . LORazepam  2 mg Intravenous Q4H  . mouth rinse  15 mL Mouth Rinse BID  . sodium chloride flush  3 mL Intravenous Q12H   Continuous Infusions: . sodium chloride    . morphine 1 mg/hr (05/04/20 1557)   PRN Meds: sodium chloride, acetaminophen, chlorpheniramine-HYDROcodone, glycopyrrolate, haloperidol **OR** haloperidol **OR**  haloperidol lactate, ipratropium-albuterol, morphine injection, morphine CONCENTRATE **OR** morphine CONCENTRATE, ondansetron **OR** ondansetron (ZOFRAN) IV, sodium chloride flush  Time spent: 30 minutes   Author: Berle Mull, MD [facility] 04/10/2020 5:28 PM  To reach On-call, see care teams to locate the attending and reach out via [email or web address]. Between 7PM-7AM, please contact night-coverage If you still have difficulty reaching the attending provider, please page the [facility] (Director on Call) for [facility] on amion for assistance.

## 2020-04-27 NOTE — Telephone Encounter (Signed)
Spoke with hospice. Agreed with appropriateness

## 2020-04-27 NOTE — Progress Notes (Signed)
Daily Progress Note   Patient Name: [name]       Date: 04/06/2020 DOB: 08-07-1932  Age: 86 y.o. MRN#: [id number] Attending Physician: Lavina Hamman, MD Primary Care Physician: Rutherford Guys, MD Admit Date: 04/09/2020  Reason for Consultation/Follow-up: Establishing goals of care, Non pain symptom management and Terminal Care  Subjective: Family at bedside. Patient remains nonresponsive. Shallow, rapid respirations, rapid pulse.  Discussed dying process with family- discussed titrating oxygen down to room air- oxygen is not affecting comfort at this point. Will schedule morphine for more aggressive symptom management.   Review of Systems  Unable to perform ROS: Acuity of condition    Length of Stay: 4  Current Medications: Scheduled Meds:  . mouth rinse  15 mL Mouth Rinse BID  .  morphine injection  2 mg Intravenous Q6H  . sodium chloride flush  3 mL Intravenous Q12H    Continuous Infusions: . sodium chloride      PRN Meds: sodium chloride, acetaminophen, chlorpheniramine-HYDROcodone, glycopyrrolate, haloperidol **OR** haloperidol **OR** haloperidol lactate, ipratropium-albuterol, LORazepam **OR** LORazepam **OR** LORazepam, morphine injection, morphine CONCENTRATE **OR** morphine CONCENTRATE, ondansetron **OR** ondansetron (ZOFRAN) IV, sodium chloride flush  Physical Exam Vitals and nursing note reviewed.  Cardiovascular:     Rate and Rhythm: Tachycardia present.  Pulmonary:     Comments: Increased rate, shallow Neurological:     Comments: nonresponsive             Vital Signs: BP (!) 86/47   Pulse (!) 120   Temp 97.6 F (36.4 C) (Axillary)   Resp (!) 33   Ht 5\' 6"  (1.676 m)   Wt 58.3 kg   SpO2 (!) 82% Comment: not sure if accurate hands are cold  BMI 20.74  kg/m  SpO2: SpO2: (!) 82 %(not sure if accurate hands are cold) O2 Device: O2 Device: Nasal Cannula O2 Flow Rate: O2 Flow Rate (L/min): 6 L/min  Intake/output summary:   Intake/Output Summary (Last 24 hours) at 04-06-2020 0902 Last data filed at 04/03/2020 2311 Gross per 24 hour  Intake 0 ml  Output --  Net 0 ml   LBM: Last BM Date: 04/12/2020 Baseline Weight: Weight: 58.3 kg Most recent weight: Weight: 58.3 kg       Palliative Assessment/Data: PPS: 10%     Patient Active Problem List  Diagnosis Date Noted  . Palliative care by specialist   . Terminal care   . Exudative pleural effusion   . Dysphagia causing pulmonary aspiration with swallowing   . Frailty syndrome in geriatric patient   . Comfort measures only status   . DNR (do not resuscitate)   . Goals of care, counseling/discussion   . Advanced care planning/counseling discussion   . Recurrent right pleural effusion 04/13/2020  . Chronic diastolic CHF (congestive heart failure) ([place]) 03/11/2020  . Pleural effusion on right 03/11/2020  . Shortness of breath 03/11/2020  . Mixed hyperlipidemia 03/11/2020  . Pneumonia 03/11/2020  . MSSA bacteremia 07/15/2019  . Hypoxia   . Peripheral edema   . Thyroid nodule   . Acute respiratory failure with hypoxia ([place]) 07/14/2019  . HCAP (healthcare-associated pneumonia) 07/14/2019  . Thyroid mass 07/14/2019  . Diaphragm paralysis 05/04/2019  . Restrictive lung disease 05/04/2019  . Altered mental status 04/30/2019  . Confusion 04/30/2019  . Abnormal weight loss 03/23/2019  . Pain in joint of left shoulder 03/23/2019  . Frequent urination 03/23/2019  . Constipation 03/23/2019  . TIA (transient ischemic attack) 03/08/2019  . Hyponatremia 03/08/2019  . Impaired mobility 01/13/2019  . Seasonal allergies 01/13/2019  . Hiatal hernia 01/13/2019  . Pain in right hip 12/24/2018  . S/P right hip fracture 11/11/2018  . Recurrent falls 05/12/2018  . History of CVA  (cerebrovascular accident) 01/16/2018  . Nocturnal enuresis   . Fall   . Full incontinence of feces   . Essential hypertension   . Hypoalbuminemia due to protein-calorie malnutrition ([place])   . Abnormality of gait   . Leg edema   . Hip fracture ([place]) 03/30/2017  . Leukocytosis 03/29/2017  . Mild intermittent asthma without complication 03/50/0938  . Chronic renal insufficiency 09/22/2015  . Tachycardia 09/22/2015  . PVC (premature ventricular contraction) 09/22/2015  . Osteoporosis 06/03/2014  . Basal cell carcinoma of face 06/03/2014  . Benign prostatic hyperplasia 05/27/2013  . Eczema 04/23/2012  . Dyslipidemia 04/23/2012    Palliative Care Assessment & Plan   Patient Profile: 86 y.o.malewith past medical history of hypertension asthma BPH CVA with resulting dysphagiaandrecurrent aspiration pneumonia,[facility]21with increasing weakness, shortness of breath, loss of appetite, weight loss.Work-up reveals recurrent exudative pleural effusions likely secondary to recurrent aspiration pneumonia/pneumonitis.Patient and family have expressed that they do not want his diet restricted and that comfort is a primary goal. Palliative medicine consulted for additional assistance with goals of care.  Assessment/Recommendations/Plan  Morphine IV 1mg  q6hr Continue prn morphine and other comfort measures Titrate oxygen down to room air Do not check sats. Do not titrate O2. Give benzos and/or opioids for shortness of breath, dyspnea, increased work of breathing or respiratory rate over 25 or tachycardia.   Call PMT at [phone] for uncontrolled symptoms from 0700-1900. Outside of these hours, please contact primary team. Do not recommend transferring out of facility- not stable    Goals of Care and Additional Recommendations: Limitations on Scope of Treatment: Full Comfort Care  Code Status: DNR  Prognosis:  Hours -  Days  Discharge Planning: Anticipated Hospital Death  Care plan was discussed with patient's family.   Thank you for allowing the Palliative Medicine Team to assist in the care of this patient.   Time In: 0830 Time Out: 0905 Total Time 35 minutes Prolonged Time Billed no      Greater than 50%  of this time was spent counseling and coordinating care related to  the above assessment and plan.  Mariana Kaufman, AGNP-C Palliative Medicine   Please contact Palliative Medicine Team phone at [phone] for questions and concerns.

## 2020-04-27 NOTE — Discharge Summary (Signed)
[facility] Death Summary   Patient: [name] JQB:[id number]   PCP: Rutherford Guys, MD DOB: 03-18-1932    Admit Date:  04-21-2020  Date of Death: Date of Death: 2020-04-25  Time of Death: Time of Death: 1903-02-09  Length of Stay: 4   Hospital Diagnoses:  Principle Cause of death Aspiration pneumonia with recurrent pleural effusion  Principal Problem:   Pleural effusion on right Active Problems:   Essential hypertension   Chronic diastolic CHF (congestive heart failure) (HCC)   Recurrent right pleural effusion   Exudative pleural effusion   Dysphagia causing pulmonary aspiration with swallowing   Frailty syndrome in geriatric patient   Comfort measures only status   DNR (do not resuscitate)   Goals of care, counseling/discussion   Advanced care planning/counseling discussion   Palliative care by specialist   Terminal care   History of present illness: As per the H and P dictated on admission, "[name] is a 86 y.o. male with medical history significant for asthma, hypertension, diastolic dysfunction, MSSA bacteremia thought secondary to eczema treated in 06/2019, previous stroke, chronic hypoxic failure on 2 to 3 L of oxygen at home and debility who presented to the emergency department via EMS for evaluation of hypoxia.  Patient's family called EMS because his pulse oximetry on 2 to 3 L of oxygen was in the 80s and they had to increase it to 4 L to maintain his pulse oximetry 99%.  Patient also noted to have labored breathing as well as audible wheezes.  Patient was recently hospitalized and is status post thoracentesis for right pleural effusion.  Patient was scheduled for another paracentesis on Monday 04/03/20. Chest x-ray showed large right pleural effusion and asymmetric right-sided edema. Small left pleural effusion. Twelve-lead EKG showed sinus rhythm with ventricular bigeminy"  Hospital Course:  1.  Recurrent right pleural effusion Recent aspiration  pneumonia Follow-up x-ray after thoracentesis still shows residual pleural effusion. Last admission patient had a follow-up CT scan which did not show any evidence of malignancy. Analysis of the fluid showed exudative fluid. Fluid was dark blood-tinged this time. The frequency at which the patient is having recurrent pleural effusion is concerning for a malignant process although cytology was negative last time. Lymphoid cells were seen on path report and CBC also showed evidence of monocytosis with immature monocytes.   Daughter reported 10 lbs weight loss in 3 weeks.   2.  Dysphagia Recent aspiration pneumonia Patient has severe dysphagia and should be on thickened liquids at a minimum but has been drinking thin liquids since diagnosed with dysphagia. Patient would not accept thickened liquids. Discussed with daughter at bedside regarding aspiration being mechanical problem and for chronic dysphagia lack of ability of diet modification preventing somebody from having aspiration and eventual recurrent pneumonia which might be the cause of patient's current presentation. All this poses a significant risk to patient's prognosis and recurrence of it does frequently certainly poses prognosis less than 6 months. Speech therapy consulted. Modified barium swallow performed patient remains at risk for aspiration regardless of any modality.  3.  Goals of care Patient has recurrent right pleural effusion. Patient has severe aspiration risk and is unable to tolerate any degree of diet thickness without aspiration. Patient does not appear to have significant reserves. Patient does not want to modify his liquid intake. Patient has lost 10 pounds weight in the last few weeks. Appears debilitated and currently lethargic as well. Patient has significant recurrent pleural effusion requiring 2 thoracentesis  3 weeks apart yielding more than a liter of fluid which is exudative in nature. At present concern  is that the patient is progressively declining and at risk for poor outcomes in neck 6 months. Patient already following up with [facility] and has a MOST form which dictates DNR/DNI and consider treatment if it is appropriate for medical conditions. Explained to daughter and the patient diet given that aspiration is a real risk patient remains at risk for developing pneumonia which is a medical condition which will lead to further decline in health. Family meeting on 04/02/2020, spent 45 minutes with family along with palliative care explained poor prognosis.  Family currently agreeable to transition to complete comfort  4.  Chronic diastolic CHF Essential hypertension EF 50 to 55%. Patient was given IV Lasix due to NPO status.   5.  History of depression Poor p.o. intake and anorexia  6.  History of COPD with chronic respiratory failure hypoxic  The patient was pronounced deceased at 07:04 PM, on 2020-05-04.  Procedures and Results:  IR guided thoracentesis.   Consultations:  Speech therapy  Palliative care   The results of significant diagnostics from this hospitalization (including imaging, microbiology, ancillary and laboratory) are listed below for reference.    Significant Diagnostic Studies: DG Chest 1 View  Result Date: 04/01/2020 CLINICAL DATA:  Status post RIGHT thoracentesis. EXAM: CHEST  1 VIEW COMPARISON:  04/01/2020 FINDINGS: Smaller RIGHT pleural effusion. No evidence for pneumothorax. Improved aeration in the RIGHT lung. There is persistent opacity in the MEDIAL LEFT lung base, consistent with atelectasis or infiltrate. Small LEFT pleural effusion. IMPRESSION: 1. Smaller RIGHT pleural effusion. No pneumothorax. 2. Improved aeration in the RIGHT lung. Electronically Signed   By: Nolon Nations M.D.   On: 04/01/2020 14:01   DG Chest 2 View  Result Date: 03/28/2020 CLINICAL DATA:  Shortness of breath EXAM: CHEST - 2 VIEW COMPARISON:  03/11/2020 and CT 03/13/2020.  FINDINGS: Motion degraded lateral view. Enlargement of a moderate right pleural effusion. Tiny left pleural effusion is unchanged. The frontal view is degraded by the patient's chin, overlying the upper chest. Mild cardiomegaly. Atherosclerosis in the transverse aorta. No pneumothorax. Worsened right lower lobe airspace disease. Persistent left base Airspace disease, likely atelectasis. Remote left rib fractures. IMPRESSION: Worsened right-sided aeration, secondary to a progressive, moderate pleural effusion and adjacent airspace disease. Similar tiny left pleural effusion. Electronically Signed   By: Abigail Miyamoto M.D.   On: 03/28/2020 14:26   DG CHEST PORT 1 VIEW  Result Date: 04/02/2020 CLINICAL DATA:  Shortness of breath, agitation, RIGHT pleural effusion, history hypertension, CHF, stroke EXAM: PORTABLE CHEST 1 VIEW COMPARISON:  Portable exam 0659 hours compared to 04/01/2020 FINDINGS: Normal heart size and pulmonary vascularity. Mass effect upon LEFT lateral aspect of trachea with deviation to the RIGHT, noted on a prior CT. Hiatal hernia. Persistent RIGHT pleural effusion and basilar atelectasis. Remaining lungs clear. No pneumothorax. Bones demineralized. IMPRESSION: Persistent RIGHT pleural effusion and basilar atelectasis. Electronically Signed   By: Lavonia Dana M.D.   On: 04/02/2020 10:04   DG CHEST PORT 1 VIEW  Result Date: 04/01/2020 CLINICAL DATA:  Worsening right-sided aeration. EXAM: PORTABLE CHEST 1 VIEW COMPARISON:  Chest radiograph 03/28/2020 FINDINGS: Monitoring leads overlie the patient. Stable enlarged cardiac and mediastinal contours. Interval increase in size of moderate to large right pleural effusion with underlying consolidation. Persistent small left pleural effusion. No definite pneumothorax. Thoracic spine degenerative changes. IMPRESSION: Interval increase in size of moderate to large right  pleural effusion with underlying consolidation. Electronically Signed   By: Lovey Newcomer  M.D.   On: 04/01/2020 14:37   DG Swallowing Func-Speech Pathology  Result Date: 04/02/2020 Objective Swallowing Evaluation: Type of Study: MBS-Modified Barium Swallow Study  Patient Details Name: [name] MRN: [id number] Date of Birth: Dec 27, 1931 Today's Date: 04/02/2020 Time: SLP Start Time (ACUTE ONLY): 1106 -SLP Stop Time (ACUTE ONLY): 1140 SLP Time Calculation (min) (ACUTE ONLY): 34 min Past Medical History: Past Medical History: Diagnosis Date . Allergy   seasonal  fall . Asthma   as child . Cancer ([place])   Basal cell carcinoma; multiple . Chronic diastolic CHF (congestive heart failure) ([place]) 03/11/2020 . Dyspnea  . Eczema  . Hyperlipidemia  . Hypertension  . MSSA bacteremia 2020 . Osteoporosis  . Pleural effusion due to CHF (congestive heart failure) ([place])  . Stroke [facility])  Past Surgical History: Past Surgical History: Procedure Laterality Date . CATARACT EXTRACTION, BILATERAL  06/29/2015 . INTRAMEDULLARY (IM) NAIL INTERTROCHANTERIC Left 03/30/2017  Procedure: INTRAMEDULLARY (IM) NAIL INTERTROCHANTRIC;  Surgeon: [name], MD;  Location: [place];  Service: Orthopedics;  Laterality: Left; . INTRAMEDULLARY (IM) NAIL INTERTROCHANTERIC Right 11/12/2018  Procedure: INTRAMEDULLARY (IM) NAIL RIGHT INTERTROCHANTERIC HIP FRACTURE;  Surgeon: [name], MD;  Location: [facility];  Service: Orthopedics;  Laterality: Right; . MOHS SURGERY   . TEE WITHOUT CARDIOVERSION N/A 07/20/2019  Procedure: TRANSESOPHAGEAL ECHOCARDIOGRAM (TEE);  Surgeon: [name], MD;  Location: [facility] ENDOSCOPY;  Service: Cardiovascular;  Laterality: N/A; . TONSILLECTOMY  1940 maybe HPI: [name] is a 86 y.o. male with medical history significant for asthma, hypertension, diastolic dysfunction, MSSA bacteremia thought secondary to eczema treated in 06/2019, previous stroke, chronic hypoxic failure on 2 to 3 L of oxygen at home and debility who presented to the emergency department via EMS for evaluation of hypoxia.  Patient noted to have labored  breathing as well as audible wheezes.  Patient was recently hospitalized and is status post thoracentesis for right pleural effusion.  Patient was scheduled for another paracentesis on Monday 04/03/20.  MBSS in 09/21/20 revealed moderate dysphagia with recommendations for chopped/ground diet and nectar thick liquids.  Pt has been consuming thin liquid at home as he finds thickened consistencies unpalatable.  Subjective: Pt awake, alert, pleasant.  Daughter present for evaluation Assessment / Plan / Recommendation CHL IP CLINICAL IMPRESSIONS 04/02/2020 Clinical Impression Patient presenst with a moderate-severe pharyngeal phase dysphagia, worsening in severity from previous study. Continued delay in swallow initiation results in immediate penetration and intermittent aspiration of thin liquids.  Combination of base of tongue and pharyngeal weakness results in significant post swallow residuals, greater in the vallecula than in the pyriform sinuses with resultant post swallow penetration and aspiration with significantly delayed sensation.  This occurs across consistencies and presentation of boluses and is not prevented with use of chin tuck or head turn postures. Most effective compensatory strategy is a hard throat clear and dry swallow which temporarily clears the airway however risk of continued aspiration post swallow is high given degree of residuals without ability to fully clear. Discussed with patient and daugther in length over the phone. Both wish to continue eating and patient has verbalized his dislike for thickened liquids. Although aspiration less with thickened liquids during exam, SLP in agreement that consuming thickened liquids will not necessarily decrease aspiration risk not the risk of an aspiration related infection at this time. Patient wishes to continue eating and drinking thin liquids with known risk of aspiration. Discussed compensatory  strategies/aspiration precautions which may mitigate  but not fully elimimate risk. Condition is no doubt chronic in nature however unclear how much it might improve in severity with improved condition overall as patient is currently very debilitated. Will f/u for education as needed and reinforcement of compensatory strategies.    SLP Visit Diagnosis Dysphagia, oropharyngeal phase (R13.12) Attention and concentration deficit following -- Frontal lobe and executive function deficit following -- Impact on safety and function Severe aspiration risk   CHL IP TREATMENT RECOMMENDATION 04/02/2020 Treatment Recommendations Therapy as outlined in treatment plan below   Prognosis 09/22/2019 Prognosis for Safe Diet Advancement Fair Barriers to Reach Goals -- Barriers/Prognosis Comment -- CHL IP DIET RECOMMENDATION 04/02/2020 SLP Diet Recommendations Dysphagia 3 (Mech soft) solids;Thin liquid Liquid Administration via Cup;No straw Medication Administration Crushed with puree Compensations Slow rate;Small sips/bites;Clear throat intermittently;Effortful swallow Postural Changes Seated upright at 90 degrees   CHL IP OTHER RECOMMENDATIONS 04/02/2020 Recommended Consults -- Oral Care Recommendations Oral care BID Other Recommendations --   CHL IP FOLLOW UP RECOMMENDATIONS 04/02/2020 Follow up Recommendations Home health SLP;Outpatient SLP   CHL IP FREQUENCY AND DURATION 04/02/2020 Speech Therapy Frequency (ACUTE ONLY) min 2x/week Treatment Duration 1 week      CHL IP ORAL PHASE 04/02/2020 Oral Phase WFL Oral - Pudding Teaspoon -- Oral - Pudding Cup -- Oral - Honey Teaspoon -- Oral - Honey Cup -- Oral - Nectar Teaspoon -- Oral - Nectar Cup -- Oral - Nectar Straw -- Oral - Thin Teaspoon -- Oral - Thin Cup -- Oral - Thin Straw -- Oral - Puree -- Oral - Mech Soft -- Oral - Regular -- Oral - Multi-Consistency -- Oral - Pill -- Oral Phase - Comment --  CHL IP PHARYNGEAL PHASE 04/02/2020 Pharyngeal Phase Impaired Pharyngeal- Pudding Teaspoon -- Pharyngeal -- Pharyngeal- Pudding Cup -- Pharyngeal --  Pharyngeal- Honey Teaspoon -- Pharyngeal -- Pharyngeal- Honey Cup -- Pharyngeal -- Pharyngeal- Nectar Teaspoon Reduced pharyngeal peristalsis;Reduced tongue base retraction;Reduced airway/laryngeal closure;Penetration/Aspiration during swallow;Penetration/Apiration after swallow;Pharyngeal residue - valleculae;Pharyngeal residue - pyriform;Pharyngeal residue - cp segment Pharyngeal Material enters airway, CONTACTS cords and not ejected out Pharyngeal- Nectar Cup Reduced pharyngeal peristalsis;Reduced tongue base retraction;Reduced airway/laryngeal closure;Penetration/Aspiration during swallow;Penetration/Apiration after swallow;Pharyngeal residue - valleculae;Pharyngeal residue - pyriform;Pharyngeal residue - cp segment Pharyngeal Material enters airway, CONTACTS cords and not ejected out Pharyngeal- Nectar Straw -- Pharyngeal -- Pharyngeal- Thin Teaspoon Reduced pharyngeal peristalsis;Reduced tongue base retraction;Reduced airway/laryngeal closure;Penetration/Aspiration during swallow;Penetration/Apiration after swallow;Pharyngeal residue - valleculae;Pharyngeal residue - pyriform;Pharyngeal residue - cp segment;Delayed swallow initiation-pyriform sinuses;Penetration/Aspiration before swallow;Moderate aspiration Pharyngeal Material enters airway, passes BELOW cords and not ejected out despite cough attempt by patient Pharyngeal- Thin Cup Reduced pharyngeal peristalsis;Reduced tongue base retraction;Reduced airway/laryngeal closure;Penetration/Aspiration during swallow;Penetration/Apiration after swallow;Pharyngeal residue - valleculae;Pharyngeal residue - pyriform;Pharyngeal residue - cp segment;Delayed swallow initiation-pyriform sinuses;Penetration/Aspiration before swallow;Moderate aspiration Pharyngeal Material enters airway, passes BELOW cords and not ejected out despite cough attempt by patient Pharyngeal- Thin Straw -- Pharyngeal -- Pharyngeal- Puree Reduced pharyngeal peristalsis;Reduced tongue base  retraction;Pharyngeal residue - valleculae;Pharyngeal residue - pyriform Pharyngeal -- Pharyngeal- Mechanical Soft Reduced pharyngeal peristalsis;Reduced tongue base retraction;Pharyngeal residue - valleculae Pharyngeal -- Pharyngeal- Regular -- Pharyngeal -- Pharyngeal- Multi-consistency -- Pharyngeal -- Pharyngeal- Pill -- Pharyngeal -- Pharyngeal Comment --  CHL IP CERVICAL ESOPHAGEAL PHASE 04/02/2020 Cervical Esophageal Phase WFL Pudding Teaspoon -- Pudding Cup -- Honey Teaspoon -- Honey Cup -- Nectar Teaspoon -- Nectar Cup -- Nectar Straw -- Thin Teaspoon -- Thin Cup -- Thin Straw -- Puree -- Mechanical Soft -- Regular --  Multi-consistency -- Pill -- Cervical Esophageal Comment -- Gabriel Rainwater MA, CCC-SLP McCoy Leah [name] 04/02/2020, 1:01 PM              US THORACENTESIS ASP PLEURAL SPACE W/IMG GUIDE  Result Date: 04/01/2020 INDICATION: Patient with history of asthma, diastolic HF, chronic hypoxic respiratory failure, dyspnea, and recurrent right pleural effusion. Request made for diagnostic and therapeutic right thoracentesis. EXAM: ULTRASOUND GUIDED DIAGNOSTIC AND THERAPEUTIC RIGHT THORACENTESIS MEDICATIONS: 15 mL 1% lidocaine COMPLICATIONS: None immediate. PROCEDURE: An ultrasound guided thoracentesis was thoroughly discussed with the patient and questions answered. The benefits, risks, alternatives and complications were also discussed. The patient understands and wishes to proceed with the procedure. Written consent was obtained. Ultrasound was performed to localize and mark an adequate pocket of fluid in the right chest. The area was then prepped and draped in the normal sterile fashion. 1% Lidocaine was used for local anesthesia. Under ultrasound guidance a 6 Fr Safe-T-Centesis catheter was introduced. Thoracentesis was performed. The catheter was removed and a dressing applied. FINDINGS: A total of approximately 1.25 L of dark red fluid was removed. Samples were sent to the laboratory as requested by the  clinical team. IMPRESSION: Successful ultrasound guided right thoracentesis yielding 1.25 L of pleural fluid. Read by: Earley Abide, PA-C No pneumothorax on follow-up radiography. Electronically Signed   By: Lucrezia Europe M.D.   On: 04/01/2020 14:04    Microbiology: No results found for this or any previous visit (from the past 240 hour(s)).   Labs: CBC: No results for input(s): WBC, NEUTROABS, HGB, HCT, MCV, PLT in the last 168 hours. Basic Metabolic Panel: No results for input(s): NA, K, CL, CO2, GLUCOSE, BUN, CREATININE, CALCIUM, MG, PHOS in the last 168 hours. Liver Function Tests: No results for input(s): AST, ALT, ALKPHOS, BILITOT, PROT, ALBUMIN in the last 168 hours. Cardiac Enzymes: No results for input(s): CKTOTAL, CKMB, CKMBINDEX, TROPONINI in the last 168 hours.  Time spent: 30 minutes  Signed:  Berle Mull  [facility]  04-05-2020

## 2020-04-27 NOTE — Progress Notes (Signed)
Pts family wanted vitals done. O2 is in low 80s. Hands are cold, hard to determine how accurate reading is. Nasal cannula increased to 6L. Pt given morphine sublingual at families request for mild restlessness.

## 2020-04-27 NOTE — Plan of Care (Signed)

## 2020-04-27 NOTE — Progress Notes (Signed)
Pt expired @1904 , verified by this nurse and Glenford Bayley. Family is at tearful bedside.   [facility] is called with reference # [id number].   Family stated that the pts body is supposed to be donated to [facility] @[phone].  Spoke with nursing supervisor and they paged the on call body delivery. Waiting on call back.

## 2020-04-27 NOTE — Progress Notes (Addendum)
Pt is on a morphine drip and is now d/ced. Morphine is wasted with this nurse and Paulette Blanch, RN. Spoke with pharmacy about the waste of this medicine.

## 2020-04-27 NOTE — Progress Notes (Signed)
[facility])  Noted probable hospital death.  [facility] will continue to follow.  Please let us know if we can be of further assistance.  Venia Carbon RN, BSN, [facility] Liaison

## 2020-04-27 DEATH — deceased

## 2020-05-06 IMAGING — CT CT HEAD W/O CM
4 series · 16 of 47 positions shown, 18 images · non-contrast
Comparison: Brain MR dated 01/17/2018 and head CT dated 01/16/2018.

CLINICAL DATA: Right hand numbness. Fell and hit his head last
night.

EXAM:
CT HEAD WITHOUT CONTRAST
TECHNIQUE: Contiguous axial images were obtained from the base of the skull
through the vertex without intravenous contrast.

[Series 3: head wo · axial · 0.43mm/px · z∈[-114,+6]mm · 7 of 33 slices shown, 9 images]
[im 5/33  brain]
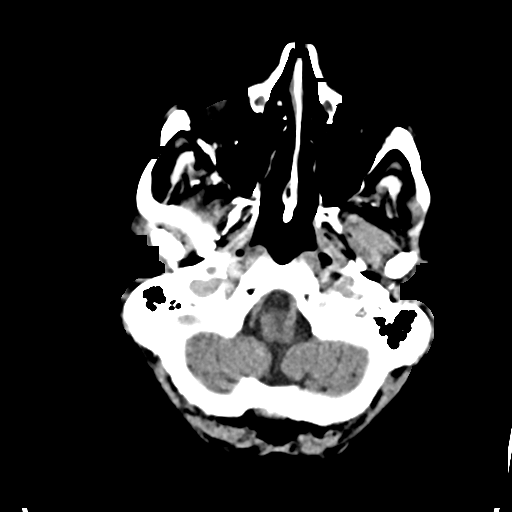
[im 5/33  bone]
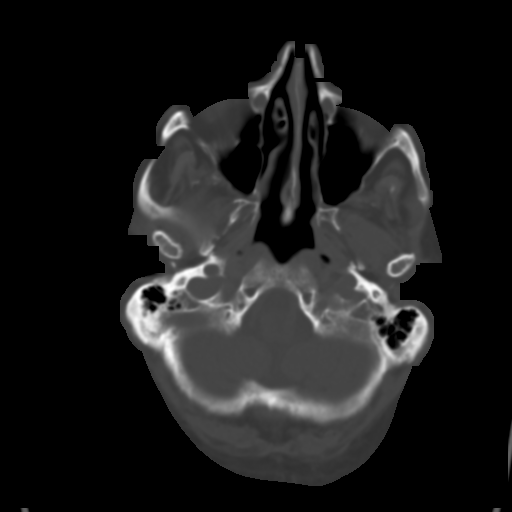
[im 9/33  brain]
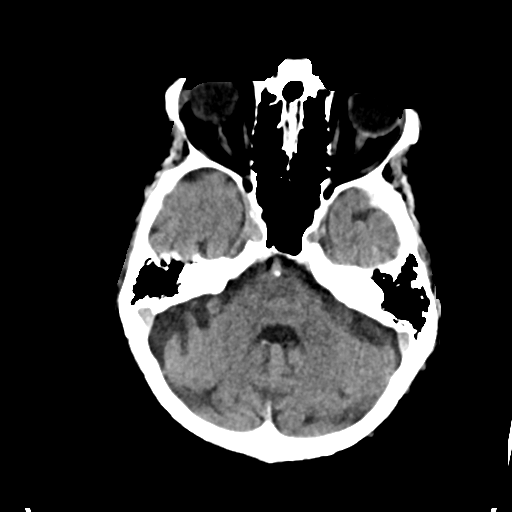
[im 13/33  brain]
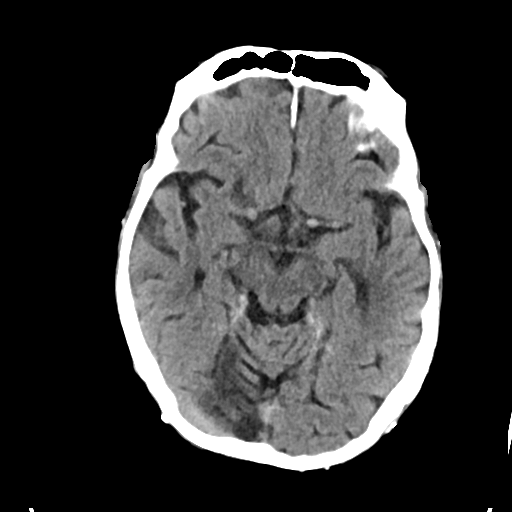
[im 17/33  brain]
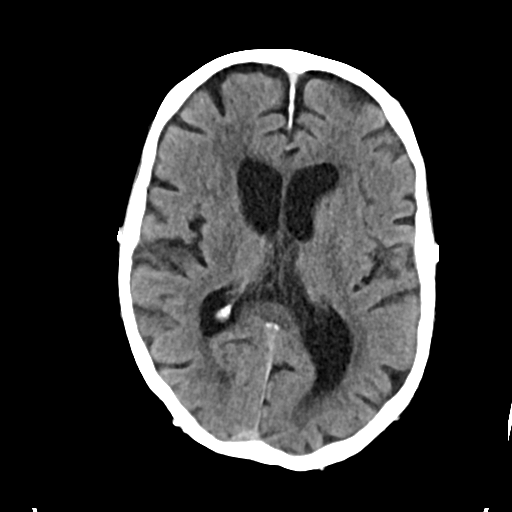
[im 21/33  brain]
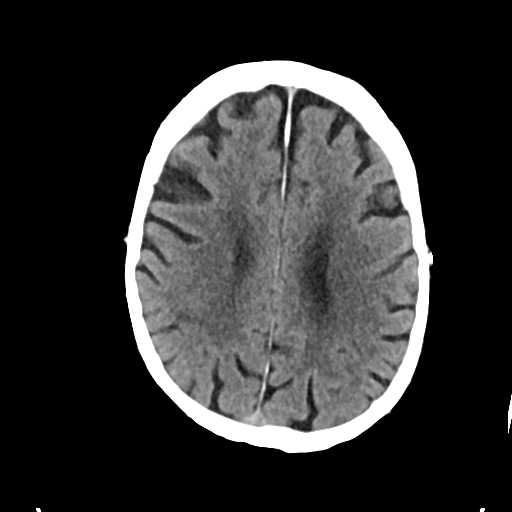
[im 21/33  bone]
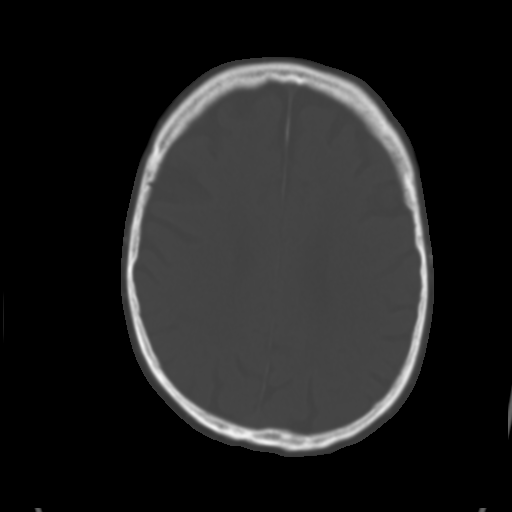
[im 25/33  brain]
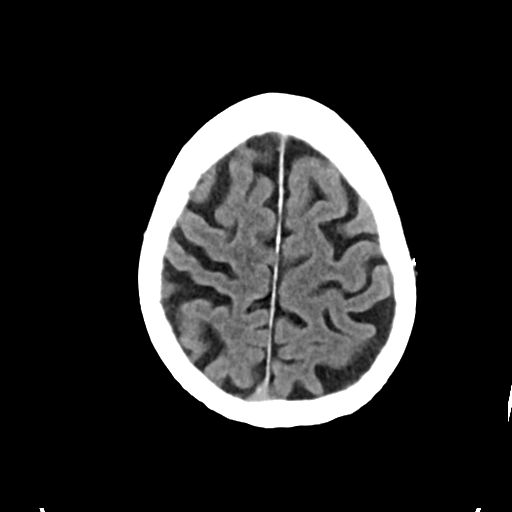
[im 29/33  brain]
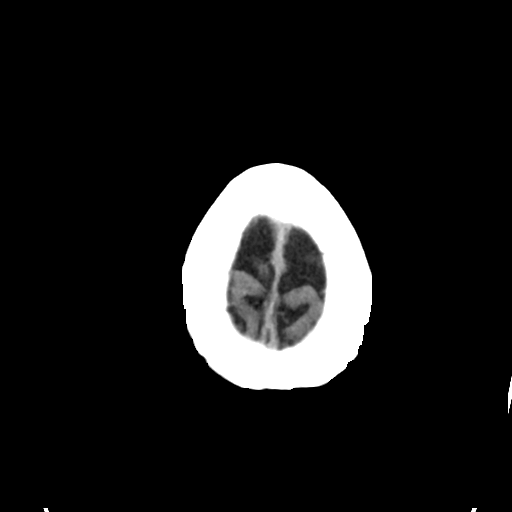

[Series 4: head bone · axial · 0.43mm/px · z∈[-118,-86]mm · 3 of 81 slices shown]
[im 9/81  bone]
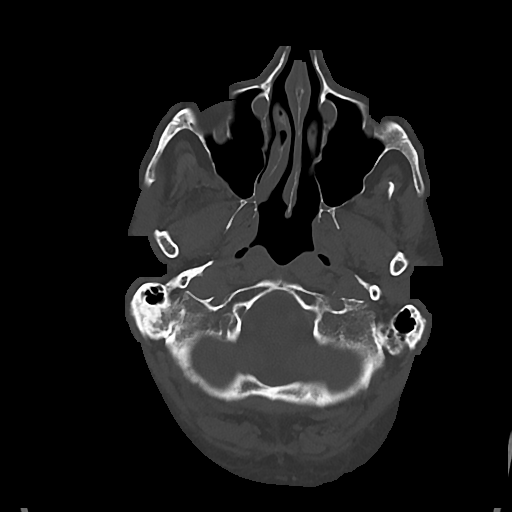
[im 17/81  bone]
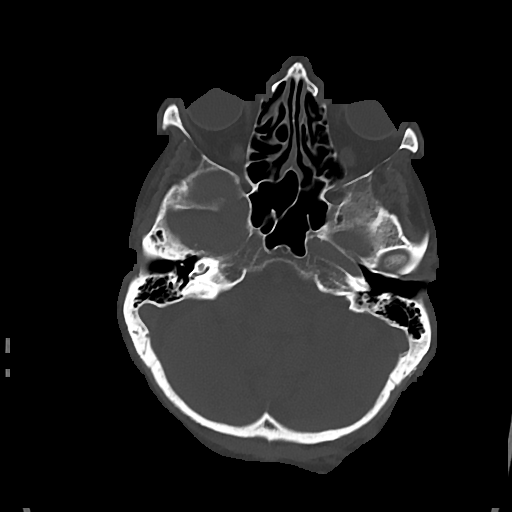
[im 25/81  bone]
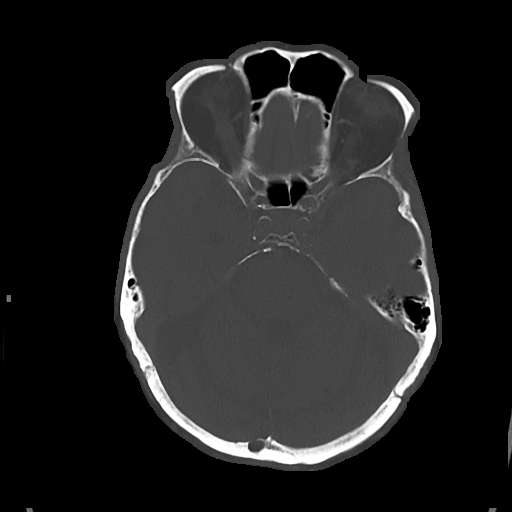

[Series 5: cor soft · coronal · 0.34mm/px · 3 of 61 slices shown]
[im 21/61  brain]
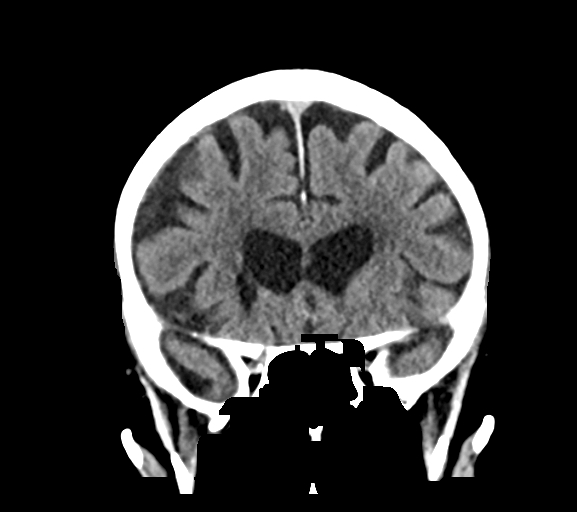
[im 27/61  brain]
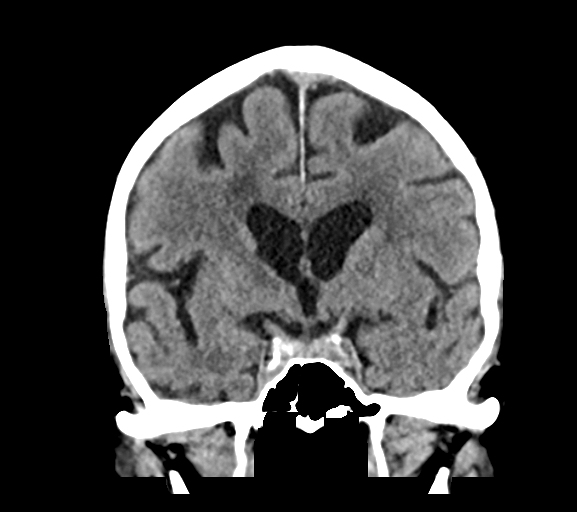
[im 34/61  brain]
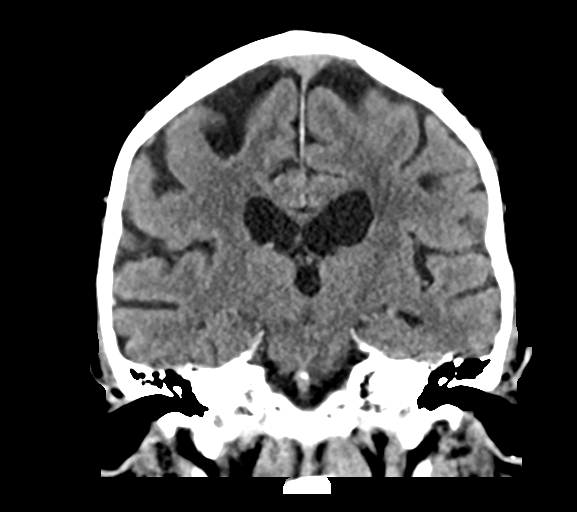

[Series 6: sag soft · sagittal · 0.35mm/px · 3 of 50 slices shown]
[im 17/50  brain]
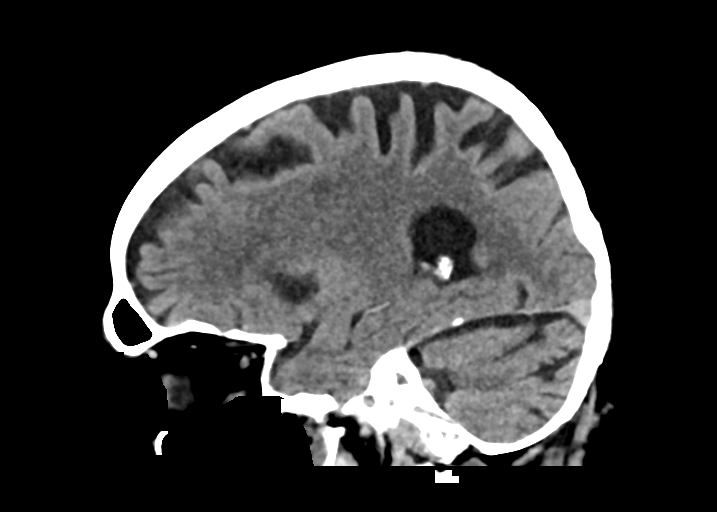
[im 25/50  brain]
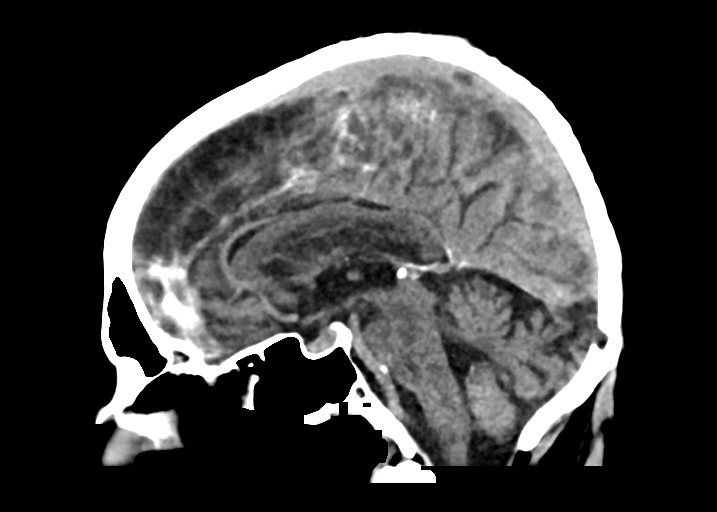
[im 33/50  brain]
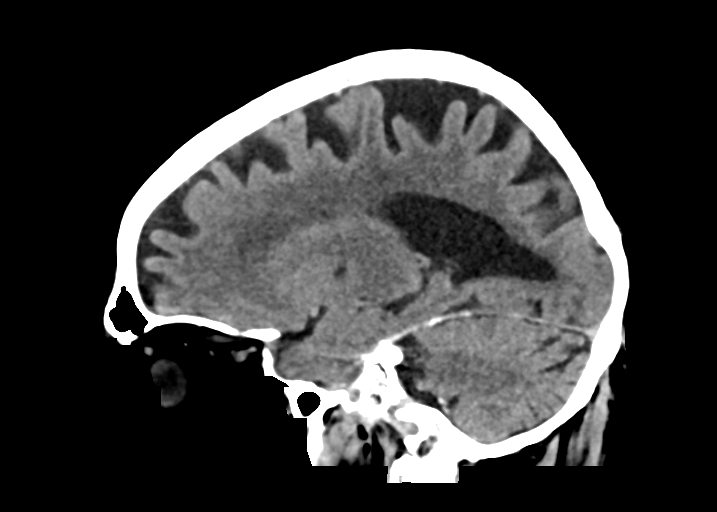

[16 of 47 positions shown; findings below may reference images not displayed]

FINDINGS: Brain: Old right caudate/basal ganglia infarct. This corresponds to
the previous acute infarct. Small, old bilateral cerebellar
hemisphere infarcts, unchanged. Mild patchy low density in the pons
without significant change. Diffusely enlarged ventricles and
subarachnoid spaces. Patchy white matter low density in both
cerebral hemispheres. No intracranial hemorrhage, mass lesion or CT
evidence of acute infarction.

Vascular: No hyperdense vessel or unexpected calcification.

Skull: Normal. Negative for fracture or focal lesion.

Sinuses/Orbits: Status post bilateral cataract extraction.
Unremarkable included paranasal sinuses.

Other: None.
IMPRESSION: 1. No acute abnormality.
2. Old right caudate/basal ganglia and bilateral cerebellar
hemisphere infarcts.
3. Stable atrophy and chronic small vessel white matter ischemic
changes.

## 2020-06-05 ENCOUNTER — Ambulatory Visit: Payer: Medicare HMO | Admitting: Neurology

## 2020-09-13 IMAGING — CR DG HIP (WITH OR WITHOUT PELVIS) 2-3V*R*
3 series · 3 of 3 positions shown · non-contrast
Comparison: Radiographs March 29, 2017.

CLINICAL DATA: Right hip pain after fall.

EXAM:
DG HIP (WITH OR WITHOUT PELVIS) 2-3V RIGHT

[x pelvis]
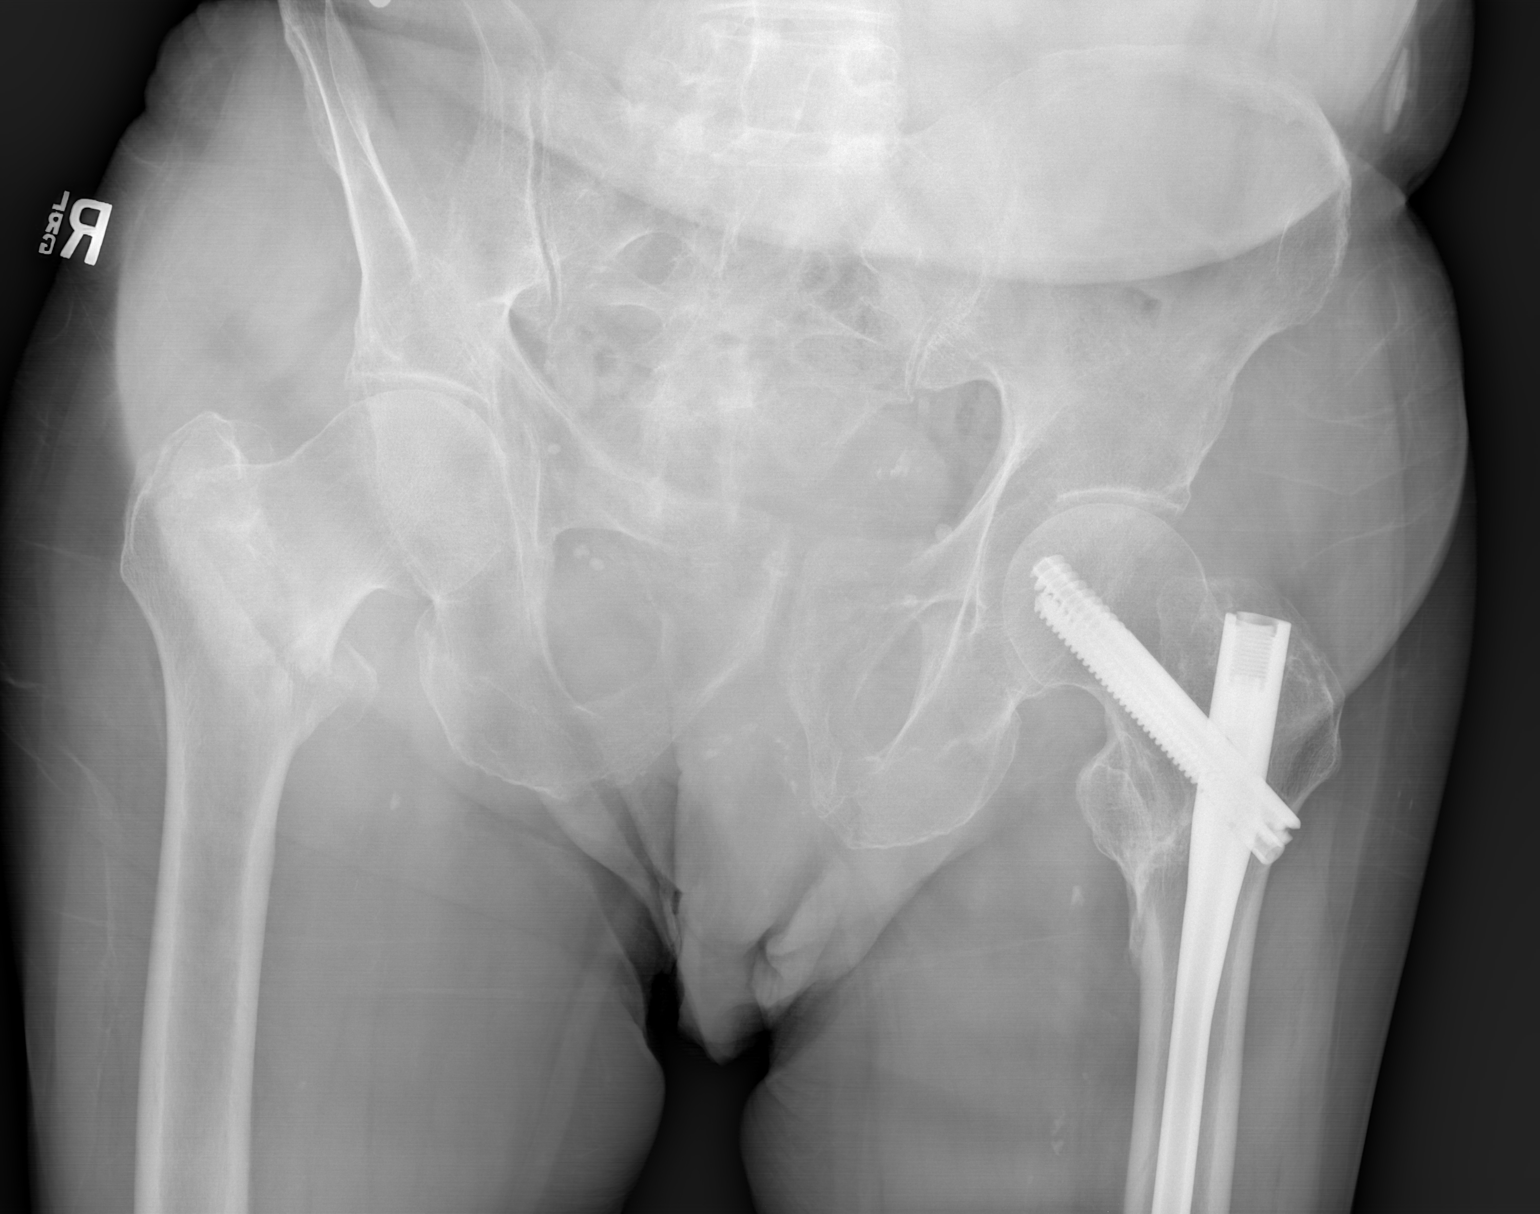

[x hip ap right]
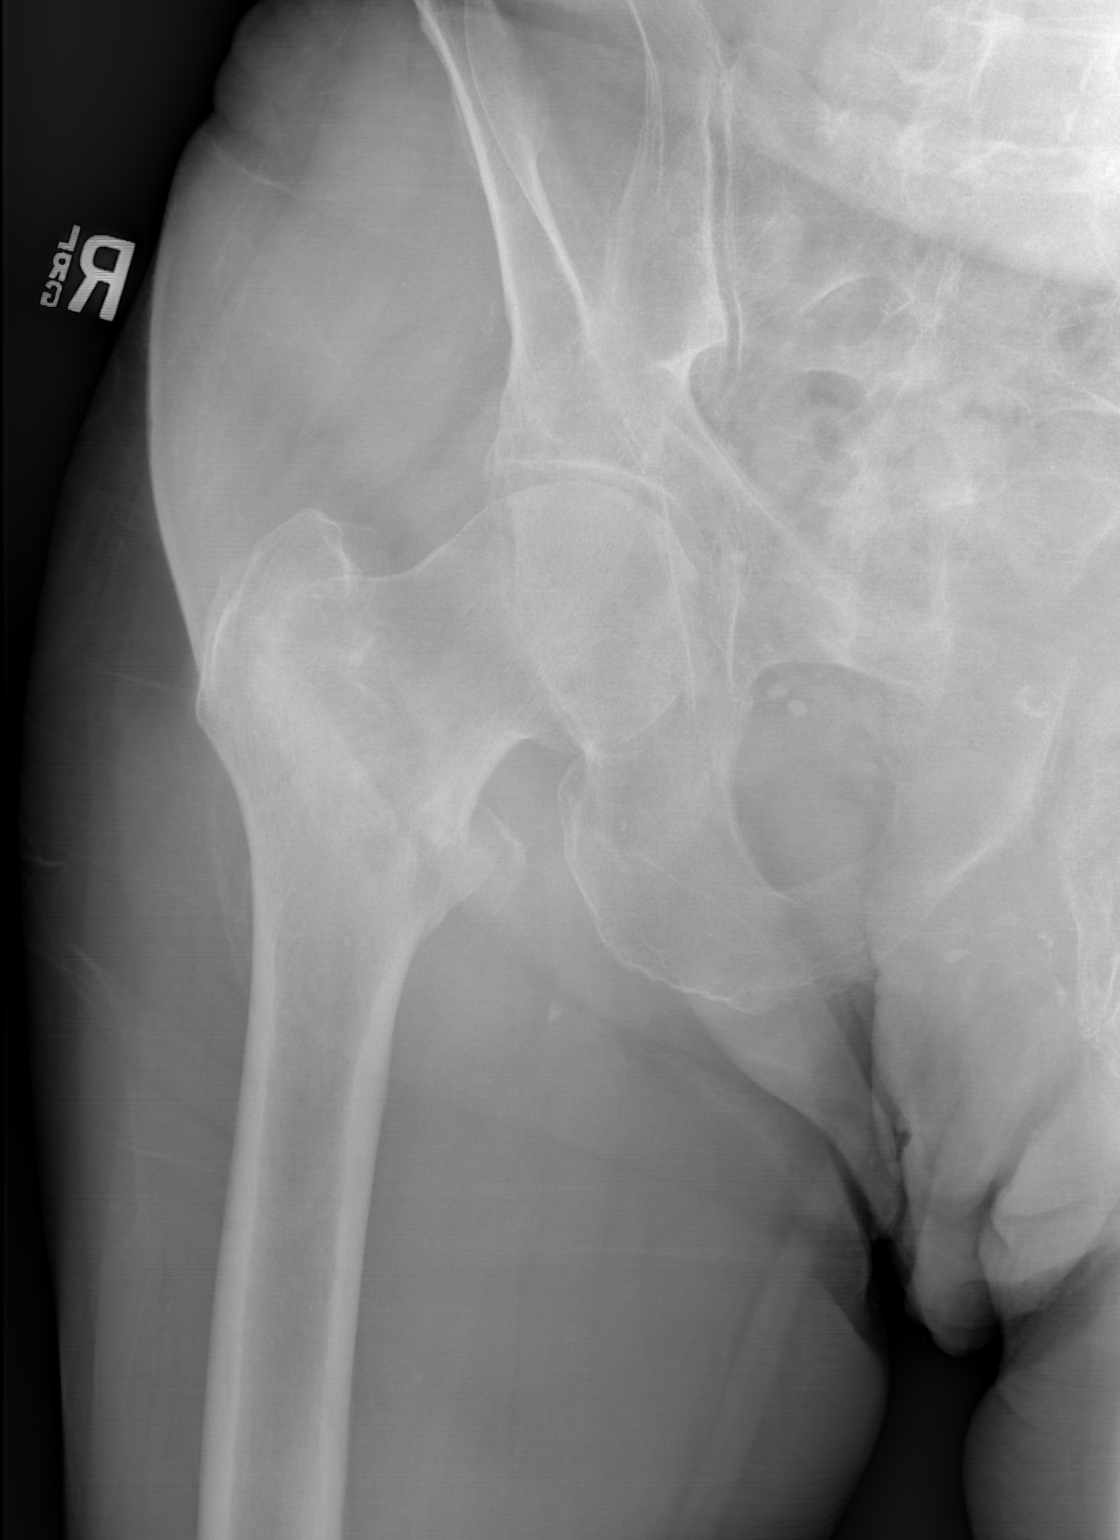

[w hip lat right]
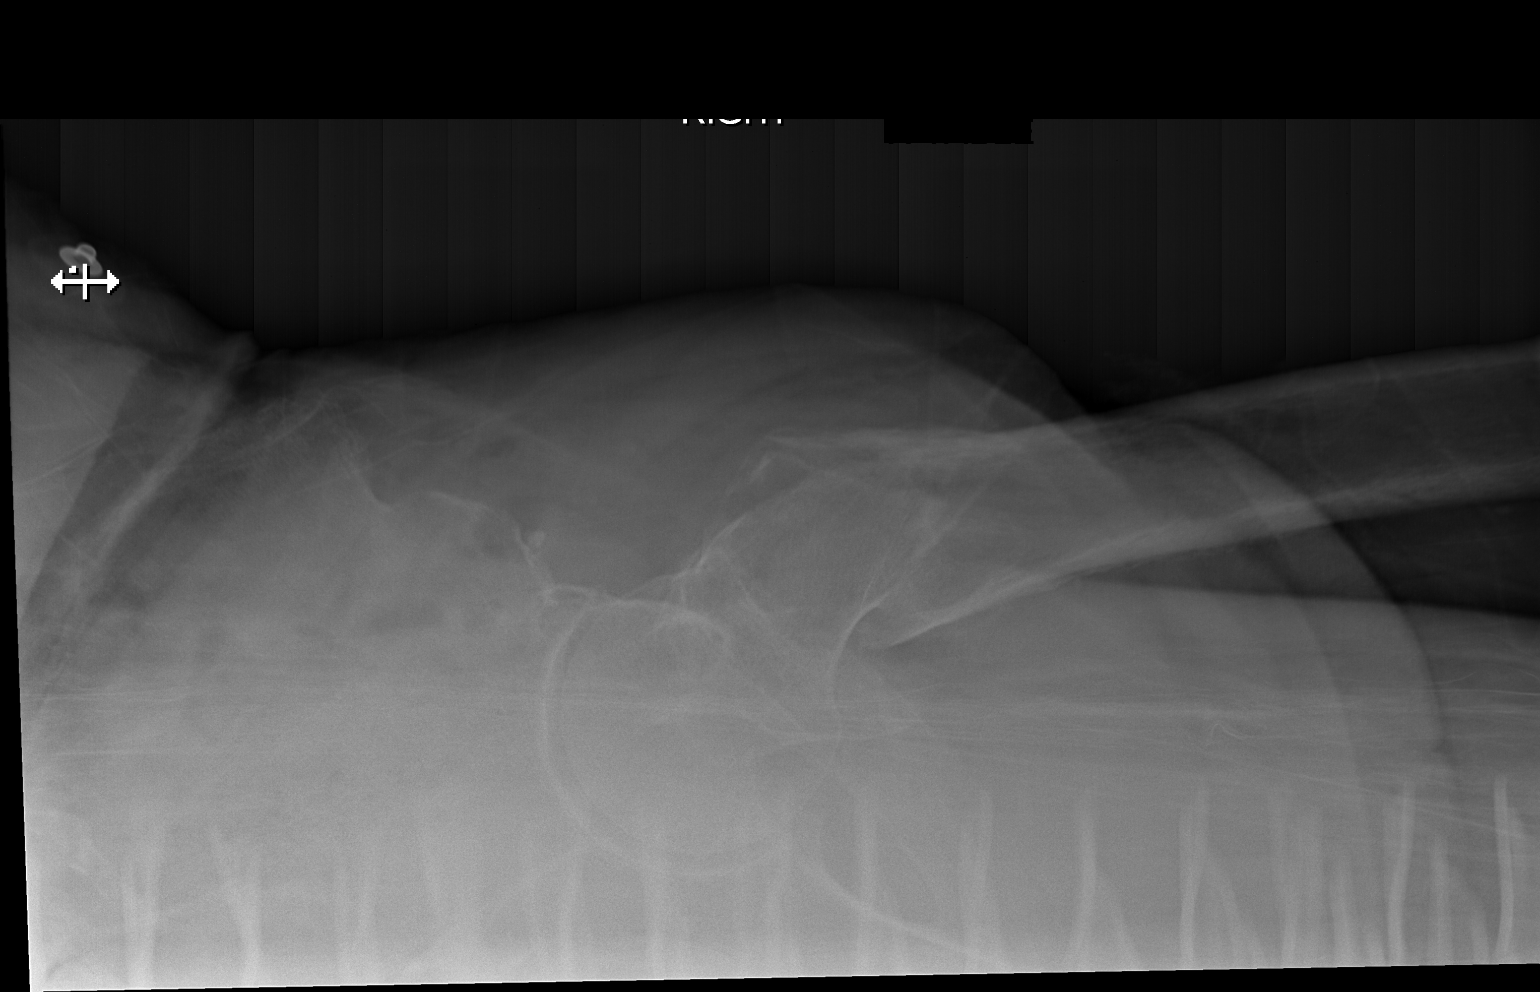

[3 of 3 positions shown; findings below may reference images not displayed]

FINDINGS: Mildly displaced and possibly comminuted fracture is seen involving
the basicervical and intertrochanteric region of the proximal right
femur. Status post previous surgical fixation of left hip.
IMPRESSION: Mildly displaced and possibly comminuted fracture involving
basicervical and intertrochanteric region of proximal right femur.
